# Patient Record
Sex: Female | Born: 1940
Health system: Southern US, Community
[De-identification: ages and names within clinical notes are randomized; demographics above are authoritative.]

## PROBLEM LIST (undated history)

## (undated) DIAGNOSIS — F32A Depression, unspecified: Secondary | ICD-10-CM

## (undated) DIAGNOSIS — F329 Major depressive disorder, single episode, unspecified: Secondary | ICD-10-CM

## (undated) DIAGNOSIS — J449 Chronic obstructive pulmonary disease, unspecified: Secondary | ICD-10-CM

## (undated) DIAGNOSIS — Z923 Personal history of irradiation: Secondary | ICD-10-CM

## (undated) DIAGNOSIS — J329 Chronic sinusitis, unspecified: Secondary | ICD-10-CM

## (undated) DIAGNOSIS — R42 Dizziness and giddiness: Secondary | ICD-10-CM

## (undated) DIAGNOSIS — D649 Anemia, unspecified: Secondary | ICD-10-CM

## (undated) DIAGNOSIS — G2581 Restless legs syndrome: Secondary | ICD-10-CM

## (undated) DIAGNOSIS — I10 Essential (primary) hypertension: Secondary | ICD-10-CM

## (undated) DIAGNOSIS — E039 Hypothyroidism, unspecified: Secondary | ICD-10-CM

## (undated) DIAGNOSIS — T7840XA Allergy, unspecified, initial encounter: Secondary | ICD-10-CM

## (undated) DIAGNOSIS — R0902 Hypoxemia: Secondary | ICD-10-CM

## (undated) DIAGNOSIS — Z9221 Personal history of antineoplastic chemotherapy: Secondary | ICD-10-CM

## (undated) DIAGNOSIS — H409 Unspecified glaucoma: Secondary | ICD-10-CM

## (undated) DIAGNOSIS — C50919 Malignant neoplasm of unspecified site of unspecified female breast: Secondary | ICD-10-CM

## (undated) DIAGNOSIS — H269 Unspecified cataract: Secondary | ICD-10-CM

## (undated) HISTORY — DX: Malignant neoplasm of unspecified site of unspecified female breast: C50.919

## (undated) HISTORY — DX: Anemia, unspecified: D64.9

## (undated) HISTORY — PX: ABDOMINAL HYSTERECTOMY: SHX81

## (undated) HISTORY — DX: Depression, unspecified: F32.A

## (undated) HISTORY — DX: Allergy, unspecified, initial encounter: T78.40XA

## (undated) HISTORY — DX: Essential (primary) hypertension: I10

## (undated) HISTORY — DX: Unspecified cataract: H26.9

## (undated) HISTORY — DX: Unspecified glaucoma: H40.9

## (undated) HISTORY — DX: Restless legs syndrome: G25.81

## (undated) HISTORY — DX: Major depressive disorder, single episode, unspecified: F32.9

## (undated) HISTORY — DX: Hypothyroidism, unspecified: E03.9

## (undated) HISTORY — DX: Hypoxemia: R09.02

---

## 1990-02-19 HISTORY — PX: VESICOVAGINAL FISTULA CLOSURE W/ TAH: SUR271

## 1997-12-14 ENCOUNTER — Other Ambulatory Visit: Admission: RE | Admit: 1997-12-14 | Discharge: 1997-12-14 | Payer: Self-pay | Admitting: Gynecology

## 1999-08-07 ENCOUNTER — Emergency Department (HOSPITAL_COMMUNITY): Admission: EM | Admit: 1999-08-07 | Discharge: 1999-08-07 | Payer: Self-pay | Admitting: *Deleted

## 1999-09-05 ENCOUNTER — Encounter: Admission: RE | Admit: 1999-09-05 | Discharge: 1999-09-05 | Payer: Self-pay | Admitting: Gynecology

## 1999-09-05 ENCOUNTER — Encounter: Payer: Self-pay | Admitting: Gynecology

## 1999-09-11 ENCOUNTER — Encounter: Admission: RE | Admit: 1999-09-11 | Discharge: 1999-09-11 | Payer: Self-pay | Admitting: Gynecology

## 1999-09-11 ENCOUNTER — Encounter: Payer: Self-pay | Admitting: Gynecology

## 1999-11-07 ENCOUNTER — Encounter: Payer: Self-pay | Admitting: *Deleted

## 1999-11-07 ENCOUNTER — Ambulatory Visit (HOSPITAL_COMMUNITY): Admission: RE | Admit: 1999-11-07 | Discharge: 1999-11-07 | Payer: Self-pay | Admitting: *Deleted

## 1999-11-14 ENCOUNTER — Ambulatory Visit (HOSPITAL_COMMUNITY): Admission: RE | Admit: 1999-11-14 | Discharge: 1999-11-14 | Payer: Self-pay | Admitting: *Deleted

## 1999-11-14 ENCOUNTER — Encounter: Payer: Self-pay | Admitting: *Deleted

## 1999-11-27 ENCOUNTER — Ambulatory Visit (HOSPITAL_COMMUNITY): Admission: RE | Admit: 1999-11-27 | Discharge: 1999-11-27 | Payer: Self-pay | Admitting: *Deleted

## 1999-11-27 ENCOUNTER — Encounter: Payer: Self-pay | Admitting: *Deleted

## 2000-09-10 ENCOUNTER — Other Ambulatory Visit: Admission: RE | Admit: 2000-09-10 | Discharge: 2000-09-10 | Payer: Self-pay | Admitting: Gynecology

## 2000-09-10 ENCOUNTER — Encounter: Payer: Self-pay | Admitting: Gynecology

## 2000-09-10 ENCOUNTER — Encounter: Admission: RE | Admit: 2000-09-10 | Discharge: 2000-09-10 | Payer: Self-pay | Admitting: Gynecology

## 2000-11-19 ENCOUNTER — Encounter: Payer: Self-pay | Admitting: Gynecology

## 2000-11-19 ENCOUNTER — Encounter: Admission: RE | Admit: 2000-11-19 | Discharge: 2000-11-19 | Payer: Self-pay | Admitting: Gynecology

## 2001-09-16 ENCOUNTER — Encounter: Admission: RE | Admit: 2001-09-16 | Discharge: 2001-09-16 | Payer: Self-pay | Admitting: Gynecology

## 2001-09-16 ENCOUNTER — Encounter: Payer: Self-pay | Admitting: Gynecology

## 2001-09-16 ENCOUNTER — Other Ambulatory Visit: Admission: RE | Admit: 2001-09-16 | Discharge: 2001-09-16 | Payer: Self-pay | Admitting: Gynecology

## 2001-11-18 ENCOUNTER — Encounter: Payer: Self-pay | Admitting: Gynecology

## 2001-11-18 ENCOUNTER — Encounter: Admission: RE | Admit: 2001-11-18 | Discharge: 2001-11-18 | Payer: Self-pay | Admitting: Gynecology

## 2002-06-07 ENCOUNTER — Encounter: Payer: Self-pay | Admitting: *Deleted

## 2002-06-07 ENCOUNTER — Ambulatory Visit (HOSPITAL_COMMUNITY): Admission: RE | Admit: 2002-06-07 | Discharge: 2002-06-07 | Payer: Self-pay | Admitting: *Deleted

## 2002-07-31 ENCOUNTER — Ambulatory Visit (HOSPITAL_COMMUNITY): Admission: RE | Admit: 2002-07-31 | Discharge: 2002-07-31 | Payer: Self-pay | Admitting: Surgery

## 2002-07-31 ENCOUNTER — Encounter: Payer: Self-pay | Admitting: Surgery

## 2002-09-22 ENCOUNTER — Encounter: Admission: RE | Admit: 2002-09-22 | Discharge: 2002-09-22 | Payer: Self-pay | Admitting: Gynecology

## 2002-09-22 ENCOUNTER — Other Ambulatory Visit: Admission: RE | Admit: 2002-09-22 | Discharge: 2002-09-22 | Payer: Self-pay | Admitting: Gynecology

## 2002-09-22 ENCOUNTER — Encounter: Payer: Self-pay | Admitting: Gynecology

## 2002-09-27 ENCOUNTER — Encounter: Payer: Self-pay | Admitting: *Deleted

## 2002-09-27 ENCOUNTER — Ambulatory Visit (HOSPITAL_COMMUNITY): Admission: RE | Admit: 2002-09-27 | Discharge: 2002-09-27 | Payer: Self-pay | Admitting: *Deleted

## 2002-11-10 ENCOUNTER — Ambulatory Visit: Admission: RE | Admit: 2002-11-10 | Discharge: 2002-11-10 | Payer: Self-pay | Admitting: Pulmonary Disease

## 2003-01-12 ENCOUNTER — Encounter: Admission: RE | Admit: 2003-01-12 | Discharge: 2003-01-12 | Payer: Self-pay | Admitting: Pulmonary Disease

## 2003-01-12 IMAGING — CT CT CHEST W/O CM
1 of 3 series · 12 of 31 positions shown, 15 images · non-contrast
Comparison: none

[Series 2: routine chest · axial · 0.70mm/px · z∈[-295,-20]mm · 12 of 67 slices shown, 15 images]
[im 6/67  mediastinal]
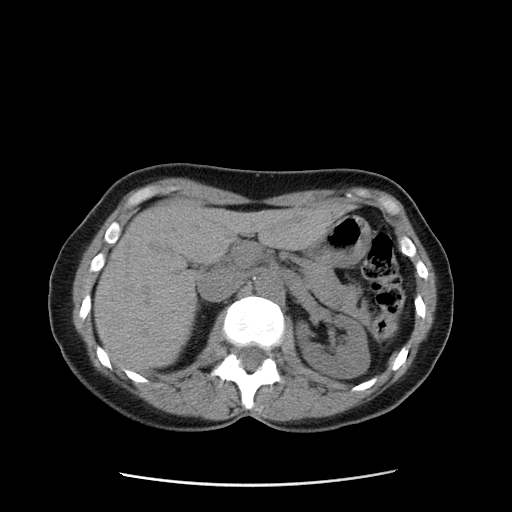
[im 6/67  lung]
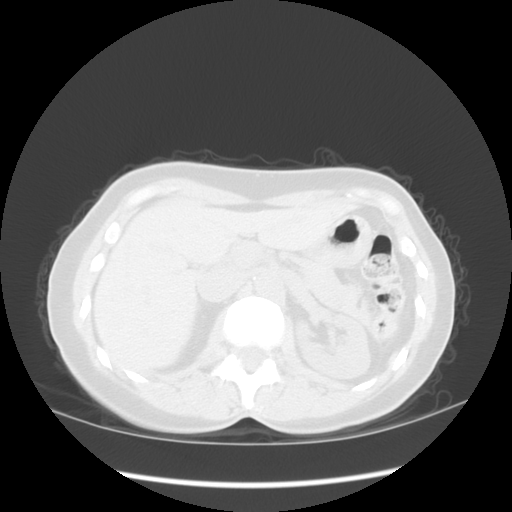
[im 12/67  lung]
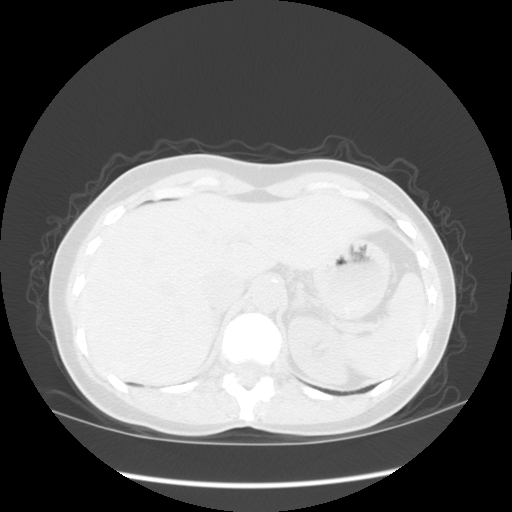
[im 17/67  lung]
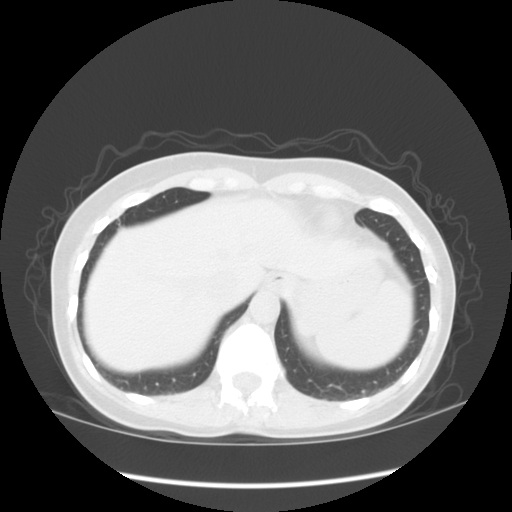
[im 23/67  lung]
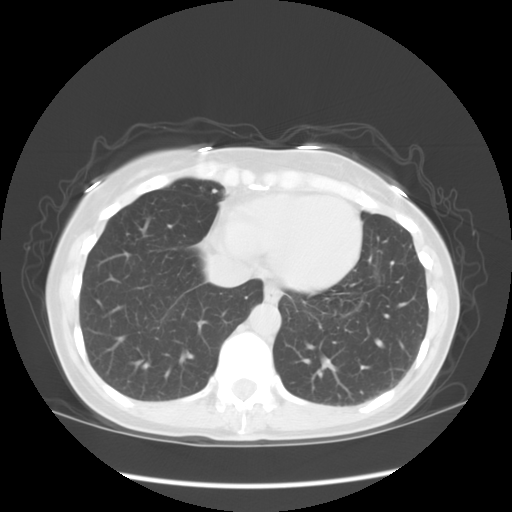
[im 28/67  mediastinal]
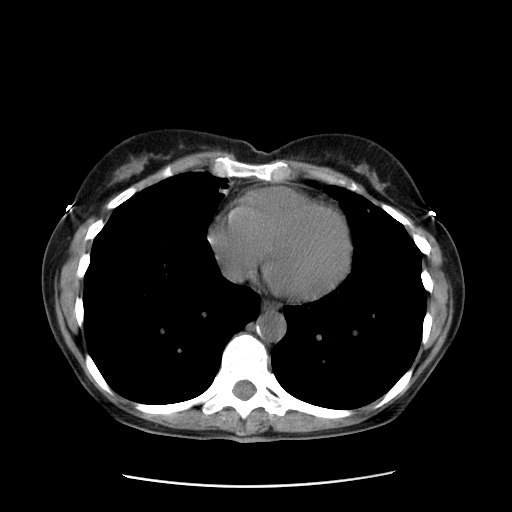
[im 28/67  lung]
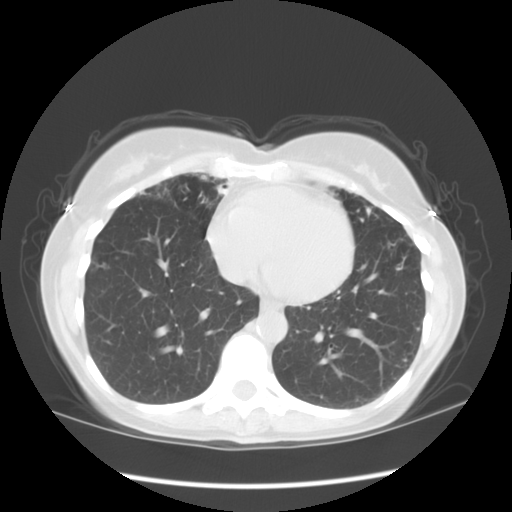
[im 32/67  lung]
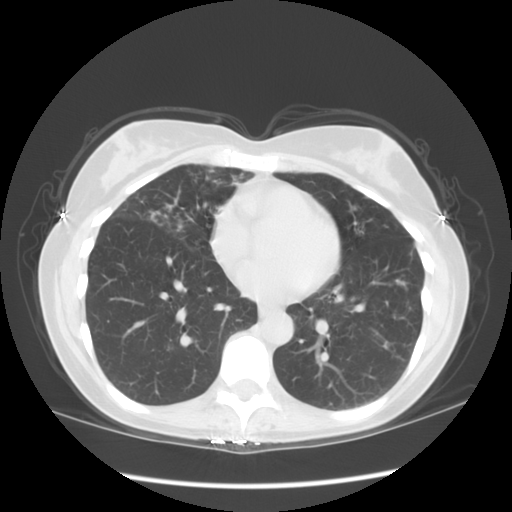
[im 34/67  lung]
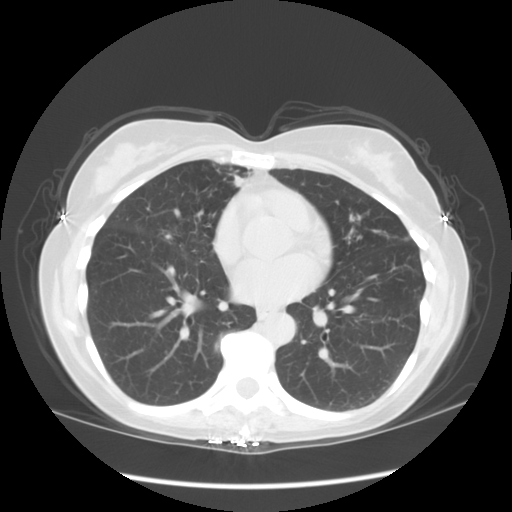
[im 39/67  lung]
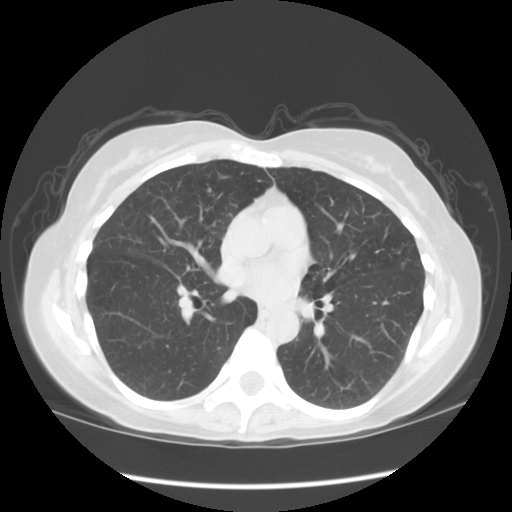
[im 45/67  mediastinal]
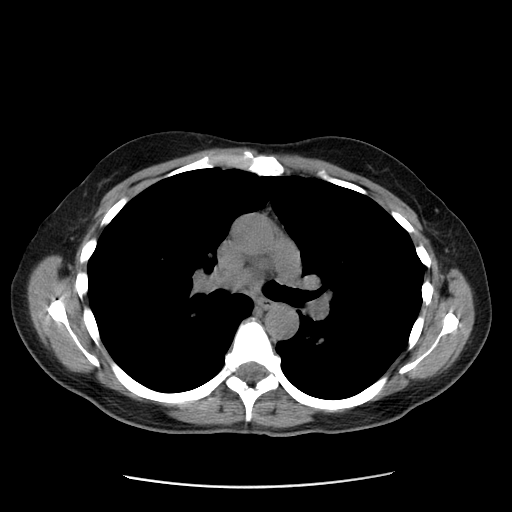
[im 45/67  lung]
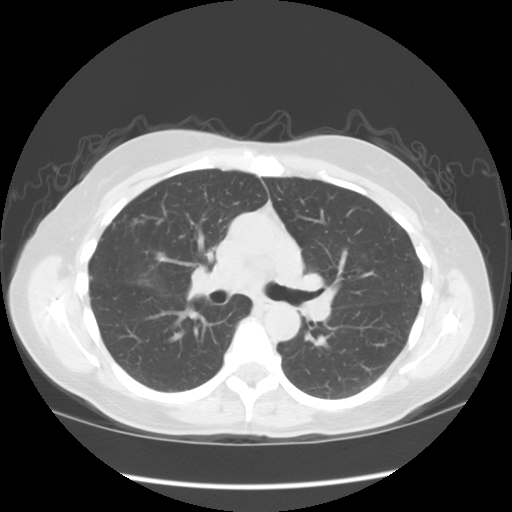
[im 50/67  lung]
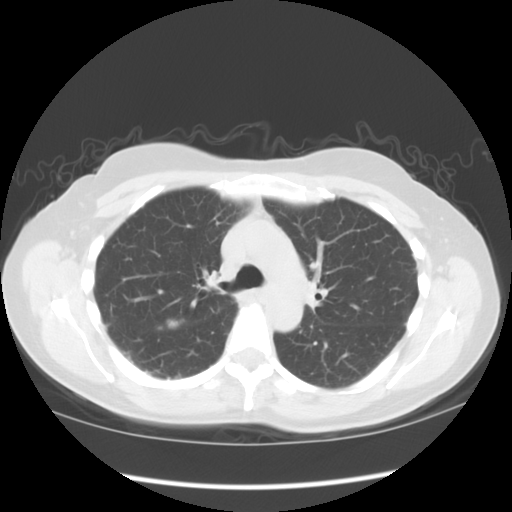
[im 56/67  lung]
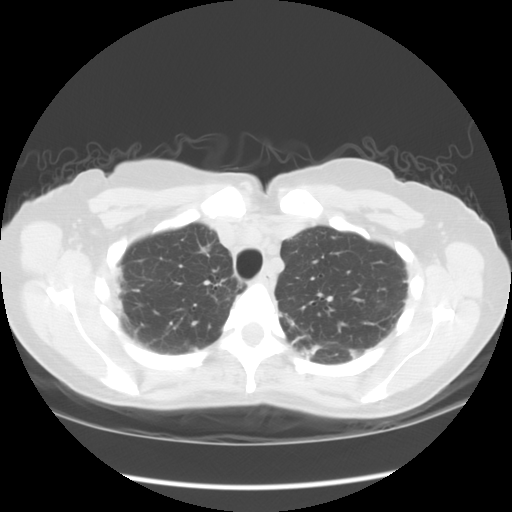
[im 61/67  lung]
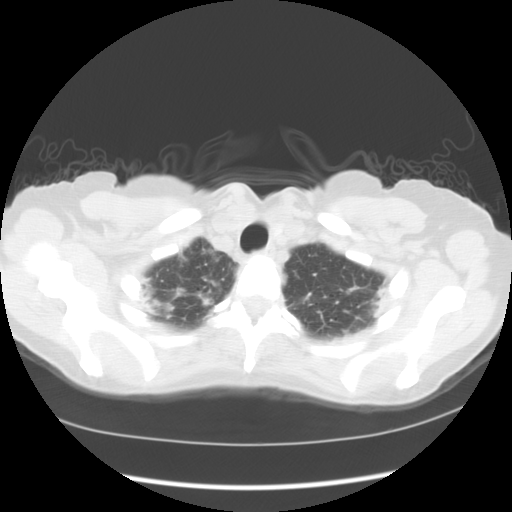

[12 of 31 positions shown; findings below may reference images not displayed]

<!--  IDXRADR:ADDEND:BEGIN -->Addendum Begins<!--  IDXRADR:ADDEND:INNER_BEGIN -->ADDENDUM:
 Previous [HOSPITAL] chest CT of [DATE] is currently available for comparison.  Since the prior study, there is resolution of patchy atelectatic infiltrative change at the right middle lobe.  Previous 8 mm spiculated mass which is currently 10 x 7 mm (image 22) is indeterminate for primary bronchogenic carcinoma or area of focal scarring.  COPD and areas of likely chronic linear fibrosis are seen primarily at the lung apices.  Allowing for no IV contrast, no progressive mediastinal, hilar, nor axillary mass/adenopathy is seen since the prior study.  Remaining findings previously described are stable. 
 IMPRESSION
 Since [HOSPITAL] chest CT, [DATE]:
 1.  Clearing of previous patchy atelectatic infiltrative change at the right middle lobe.
 2.  Stable to minimally enlarged spiculated currently 10 x 7 mm mass right upper lobe ? primary bronchogenic carcinoma versus scarring.  This is at the margin of nuclear medicine PET scanning resolution.  Nuclear medicine PET imaging and/or follow-up chest CT in six months are advised for further evaluation of this finding.
 3.    COPD with chronic lung changes, stable. 

 <!--  IDXRADR:ADDEND:INNER_END -->Addendum Ends
<!--  IDXRADR:ADDEND:END -->Clinical Data:  Follow-up nodules. 
 CT CHEST WITHOUT CONTRAST
 Comparison to prior studies are not available at this time.  The patient did have a CT of the chest at [HOSPITAL], [DATE].  A preliminary dictation will be performed and then an addendum can be issued once the prior studies have been received.  
 5 mm spiral images were obtained through the chest without contrast.  Subsequently, high-resolution images through the chest were obtained at 15 mm intervals and 1 mm sections.  On image 21, there is a 1.0 x 1.2 cm pretracheal lymph node which is borderline enlarged.  There is no other evidence of abnormal mediastinal mass effect or axillary mass effect.
 No pneumothoraces or effusions are seen.  Patchy parenchymal densities are seen at both apices.  In the right upper lobe on image 22, there is a 1.0 x 0.7 cm spiculated density.  Smaller nodules are seen in the right upper lobe adjacent to this original lesion. On image 24 in the right upper lobe, there is a 4 mm nodule and in the right upper lobe on image 23 laterally, there is a 2 mm nodule.  In the right middle lobe on image 29, there is a 3 mm nodule anteriorly.  Irregular parenchymal densities are seen in the medial right middle lobe on image #34.  Linear opacities are seen posterior to this density in the right middle lobe.  In the lingula on image 41, there is a 6 mm nodule. In the superior segment of the left lower lobe on image 11, minimal irregular parenchymal densities are noted.  
 On high-resolution images, patchy parenchymal densities are seen at the lung apices as well as the superior segment of both lower lobes with a peripheral distribution.  There is no evidence of honey-combing, septal thickening, or cystic lung disease.  Somewhat nodular thickening of the lobar and segmental bronchial airways are identified.  There is mild nodular thickening of the right greater fissure on image 10 of the high-resolution series, left greater fissure on image 4 and right greater fissure on image 4.  This nodular disease is subtle.  There is no evidence of fibrosis.  Images of the abdomen were performed.  Gallstones are visualized.  
 IMPRESSION
 1.  Cholelithiasis is suspected and can be confirmed with sonography.
 2.  Bilateral parenchymal nodules are seen as described.  This was previously described and once the prior studies have been received, a direct comparison can be performed.  
 3.  Irregular parenchymal changes are seen at the lung apices and superior lower lobes with a peripheral distribution.  If this is indeed a chronic process, considerations would include: atypical infection, eosinophilic pneumonia, bronchiolitis obliterans organizing pneumonia.  Occasionally sarcoidosis can result in upper lobe parenchymal changes. 
 4.  Nodular thickening of the airways with some pleural nodularity is noted.  This is a subtle finding and can also be seen with sarcoidosis.  Correlate clinically.
 5.  Borderline enlarged lymph node in the mediastinum. 

 co

## 2005-03-16 ENCOUNTER — Emergency Department (HOSPITAL_COMMUNITY): Admission: EM | Admit: 2005-03-16 | Discharge: 2005-03-17 | Payer: Self-pay | Admitting: Emergency Medicine

## 2005-03-16 DIAGNOSIS — I447 Left bundle-branch block, unspecified: Secondary | ICD-10-CM

## 2005-03-16 HISTORY — DX: Left bundle-branch block, unspecified: I44.7

## 2005-03-17 IMAGING — CT CT HEAD W/O CM
1 series · 16 of 30 positions shown, 20 images · non-contrast
Comparison: None.

CLINICAL DATA: Headache, dizziness, sinusitis.
 HEAD CT WITHOUT CONTRAST ? [DATE]:
TECHNIQUE: Contiguous axial CT images were obtained from the base of the skull through the vertex according to standard protocol without contrast.

[Series 2: headseq 4.8 h45s · axial · 0.42mm/px · z∈[+1080,+1220]mm · 16 of 33 slices shown, 20 images]
[im 2/33  brain]
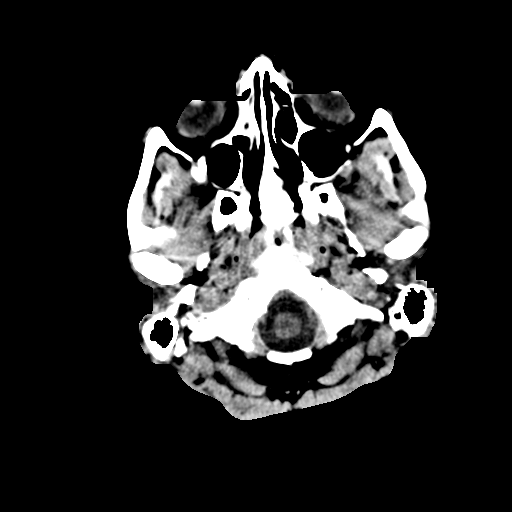
[im 2/33  bone]
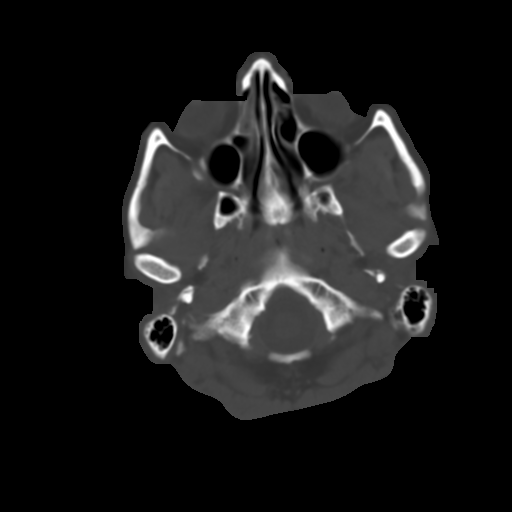
[im 4/33  brain]
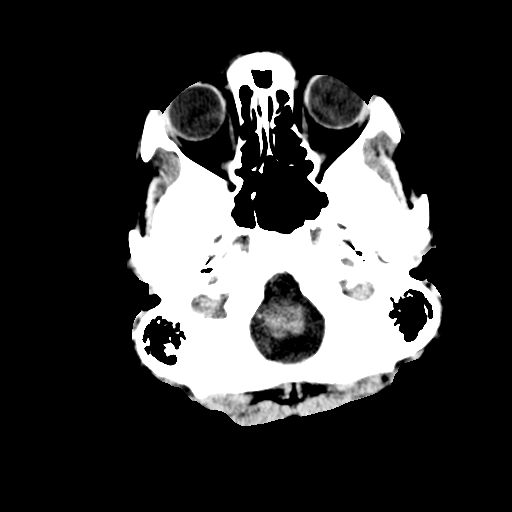
[im 6/33  brain]
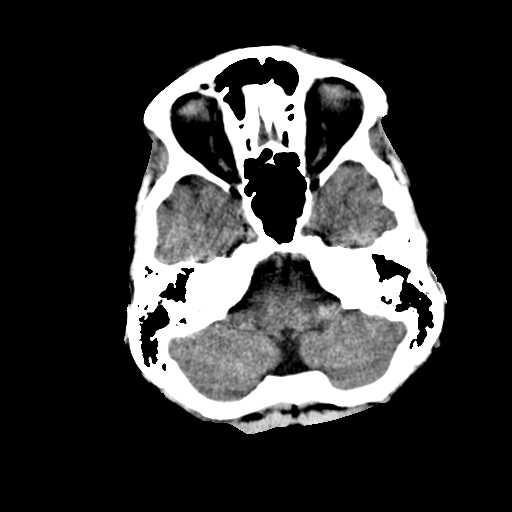
[im 8/33  brain]
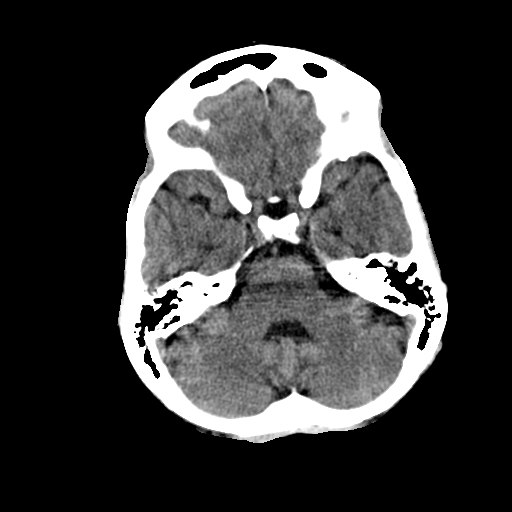
[im 9/33  brain]
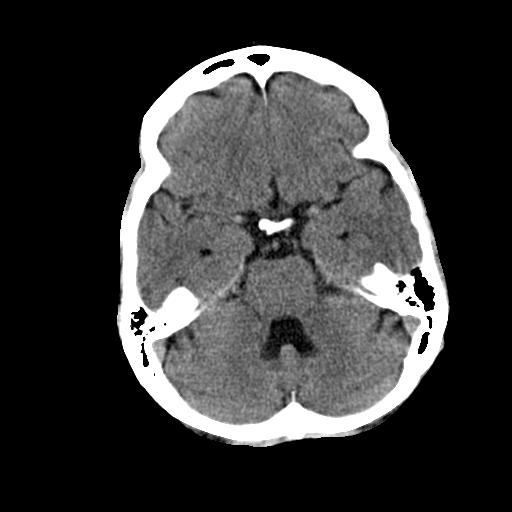
[im 9/33  bone]
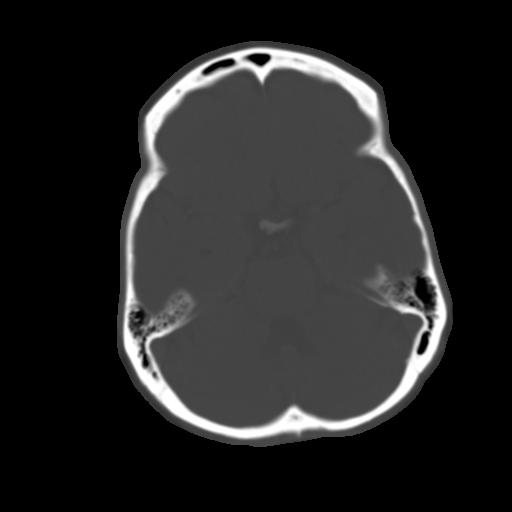
[im 12/33  brain]
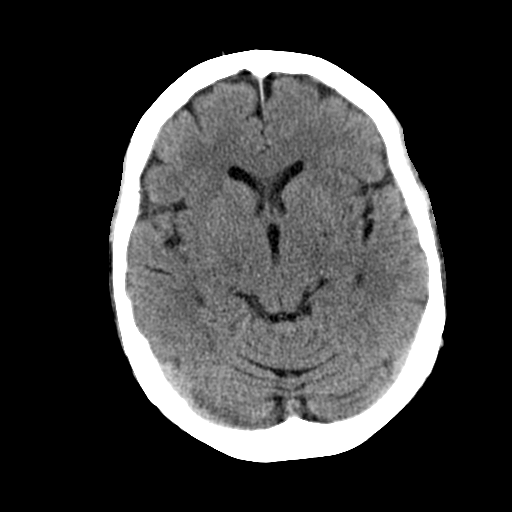
[im 14/33  brain]
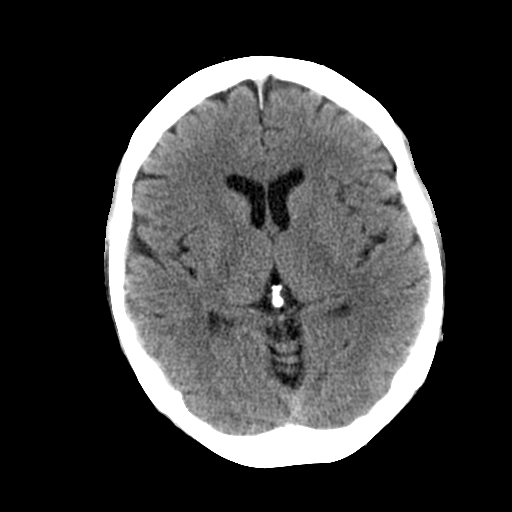
[im 16/33  brain]
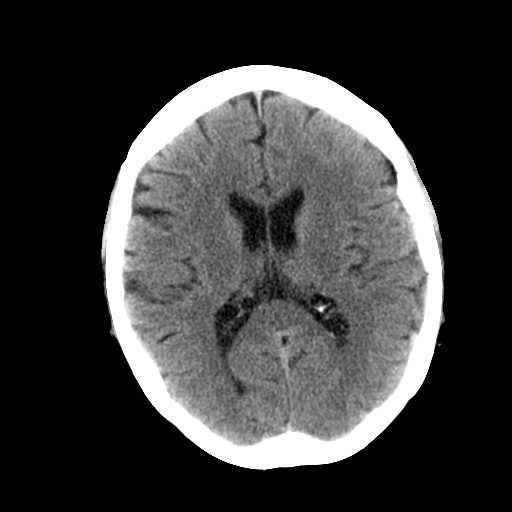
[im 17/33  brain]
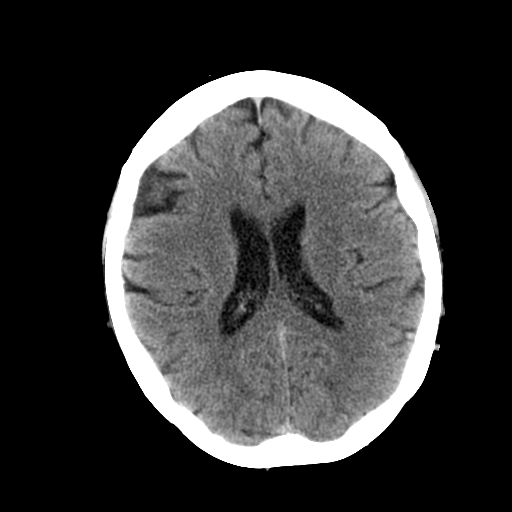
[im 17/33  bone]
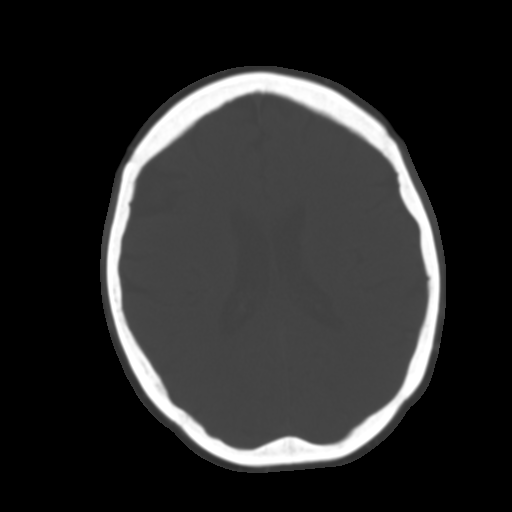
[im 19/33  brain]
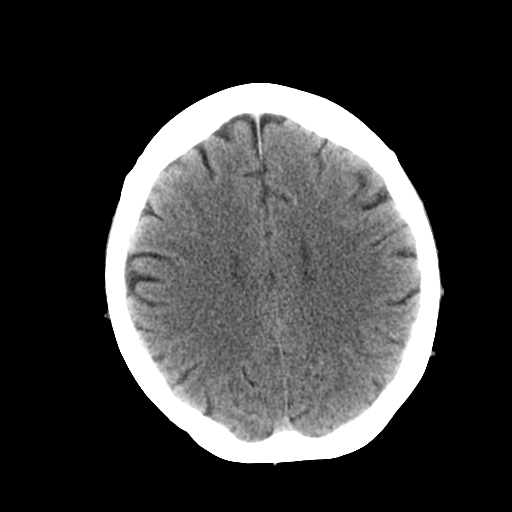
[im 21/33  brain]
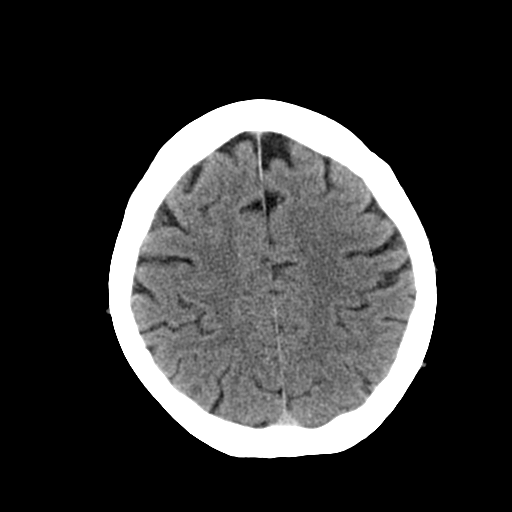
[im 24/33  brain]
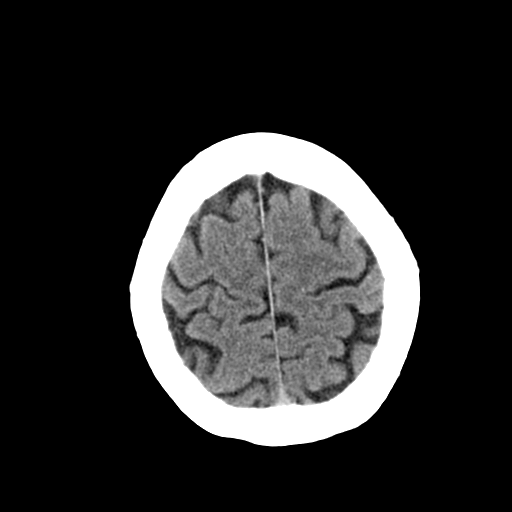
[im 25/33  brain]
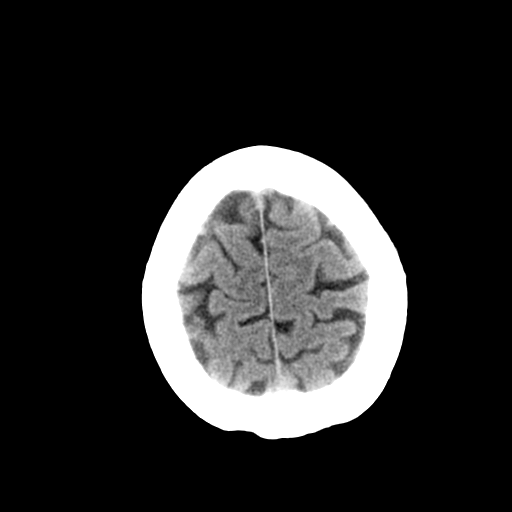
[im 25/33  bone]
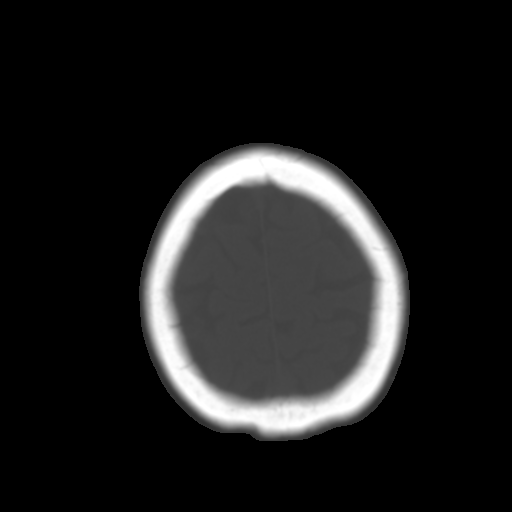
[im 27/33  brain]
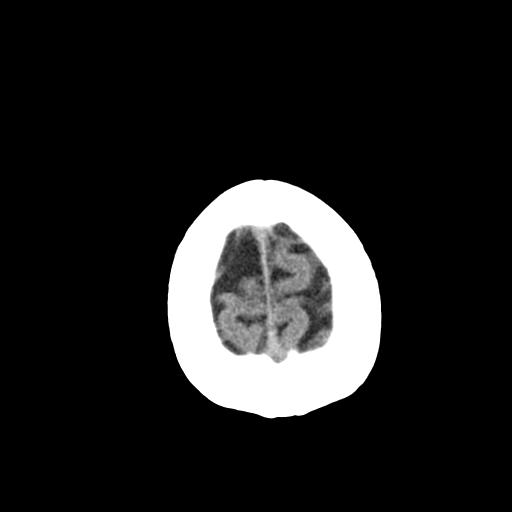
[im 29/33  brain]
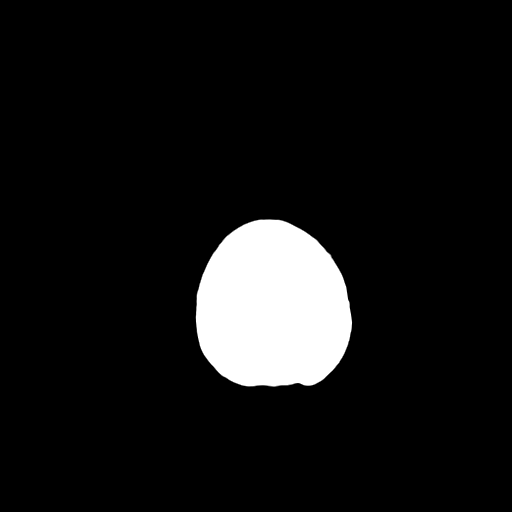
[im 31/33  brain]
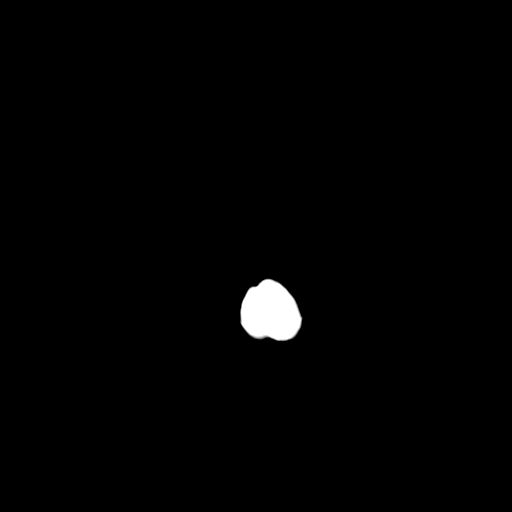

[16 of 30 positions shown; findings below may reference images not displayed]

FINDINGS: There is no evidence of intracranial hemorrhage, brain edema, or mass effect.  No other intra-axial abnormalities are seen, and the ventricles are within normal limits.  No abnormal extra-axial fluid collections or masses are identified.  No skull abnormalities are noted.
IMPRESSION: Negative non-contrast head CT.

## 2005-03-17 IMAGING — CR DG CHEST 2V
2 series · 2 of 2 positions shown · non-contrast
Comparison: Chest CT [DATE].

CLINICAL DATA: Cough, dizziness, nausea and vomiting.  
 CHEST - 2 VIEW: 
 PA and lateral chest radiograph - [DATE].

[w chest pa]
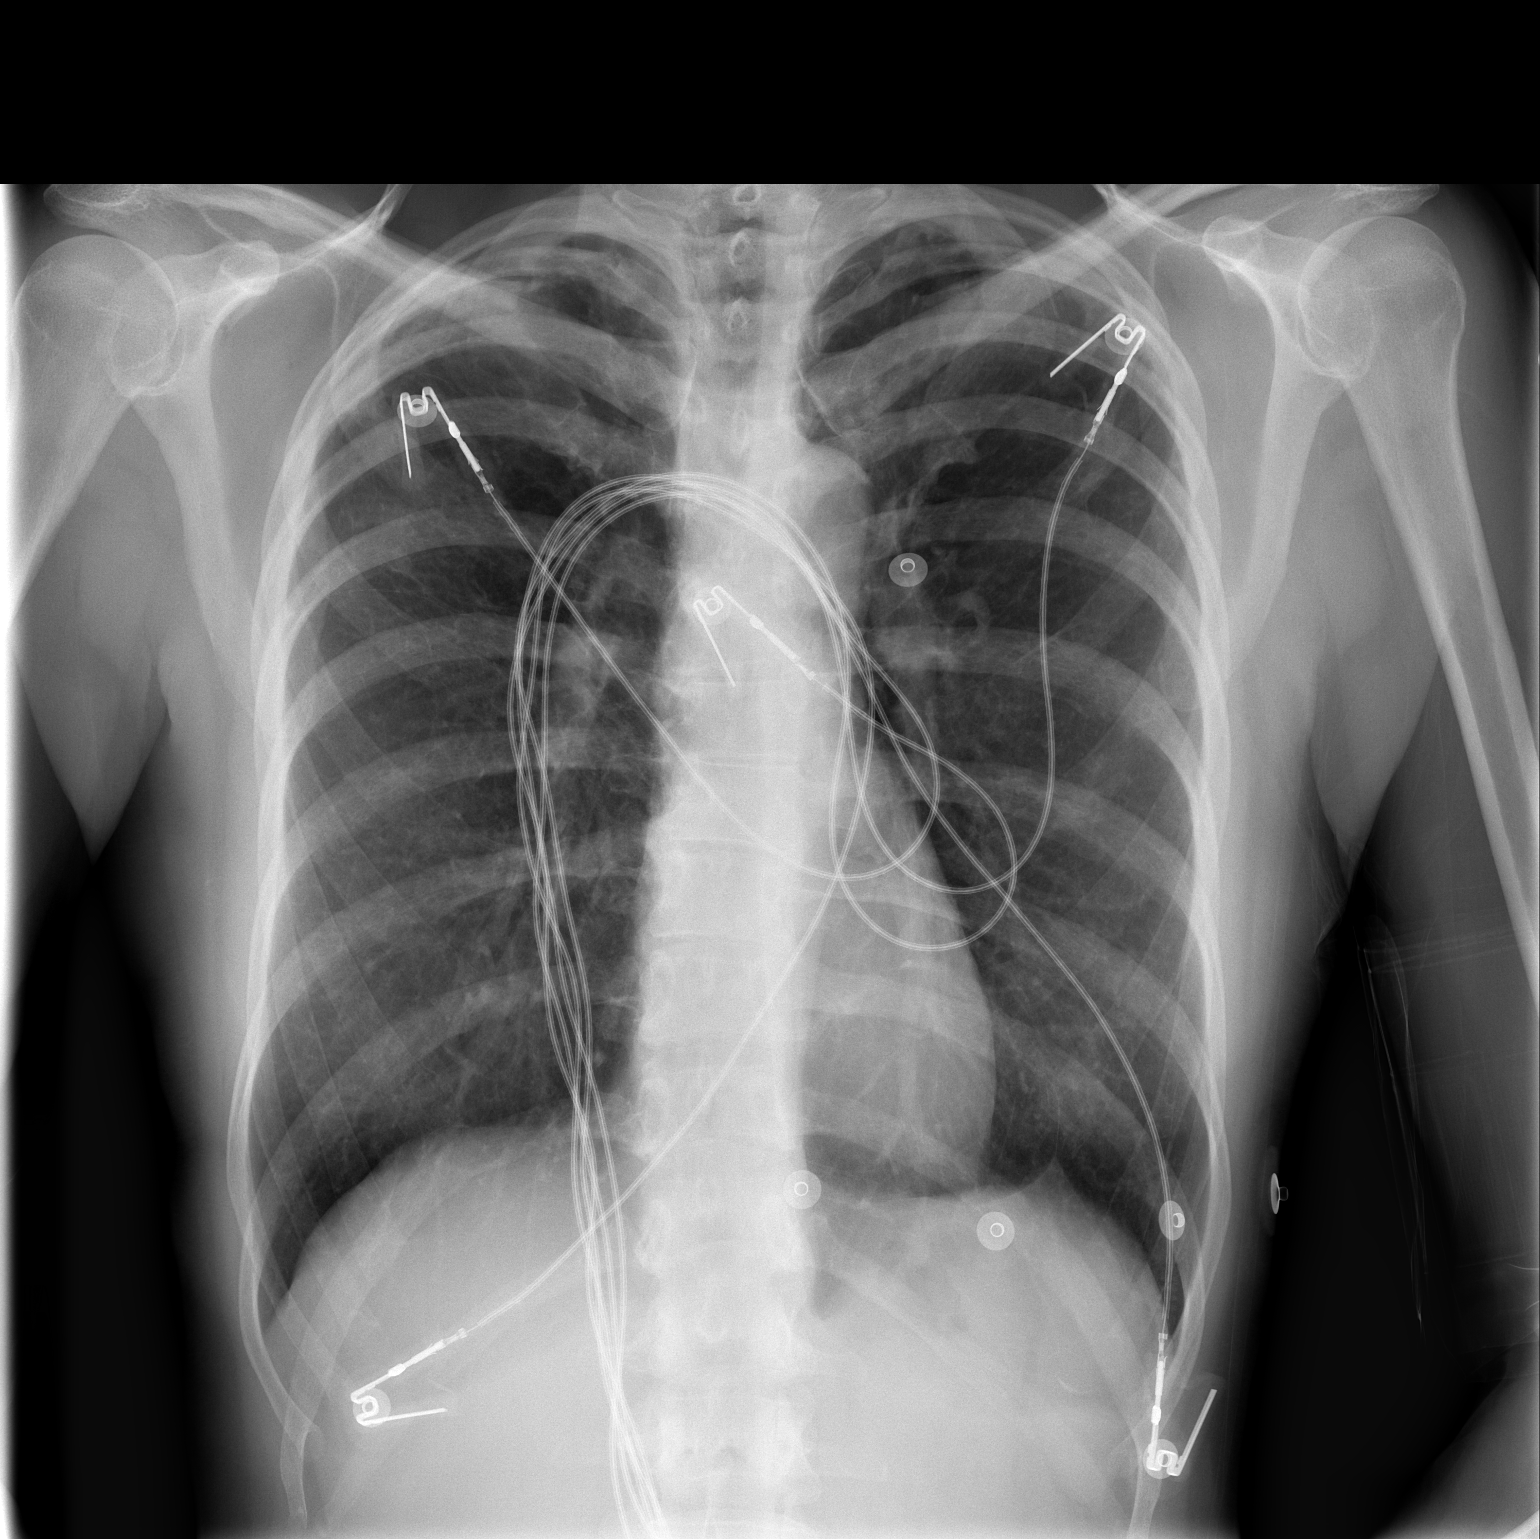

[w chest lat]
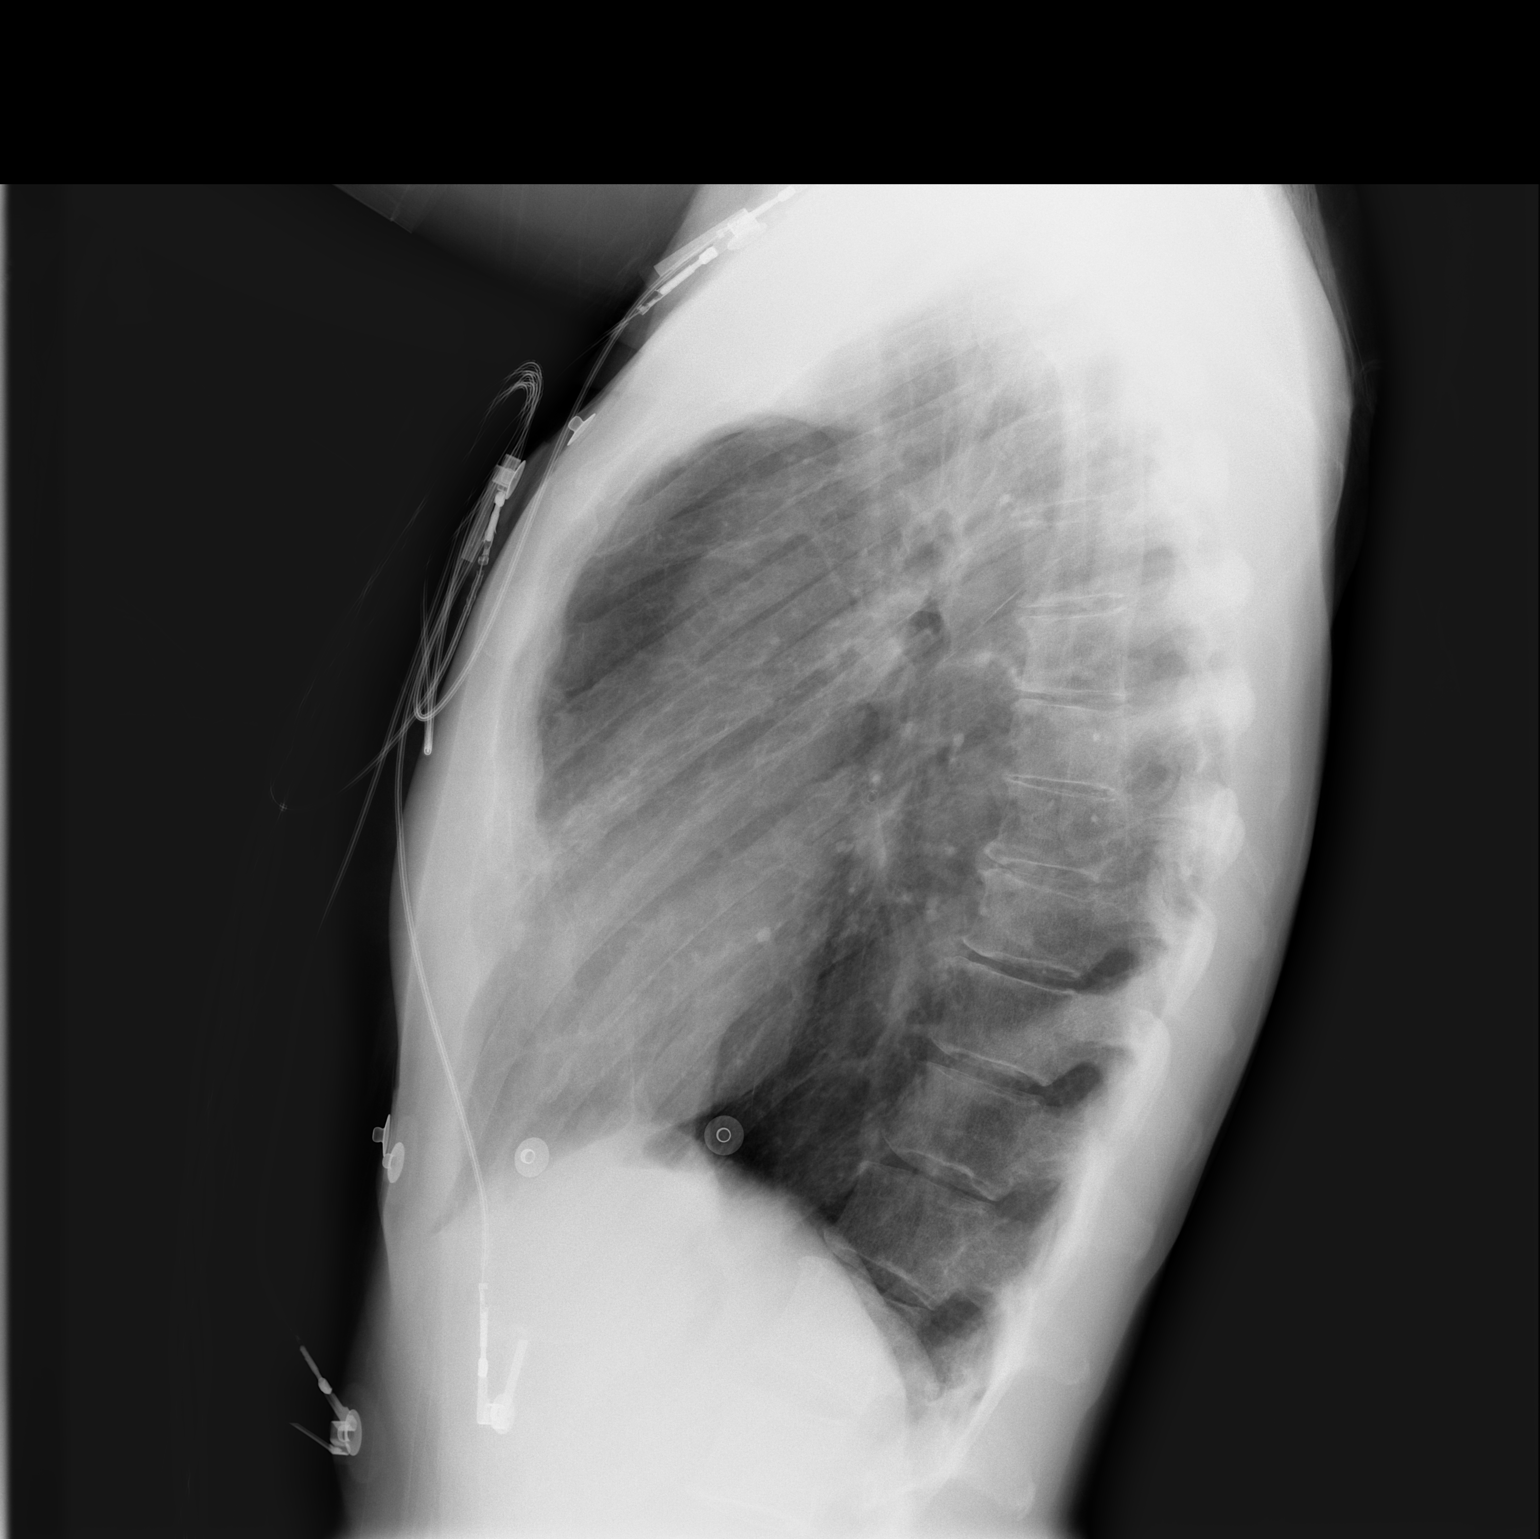

[2 of 2 positions shown; findings below may reference images not displayed]

FINDINGS: Cardiac leads overlie the chest.  The cardiomediastinal silhouette is within normal limits.  There is hyperinflation of both lungs suggesting COPD.  Biapical pleural thickening is again noted, better appreciated on the prior CT.  No pleural effusion or focal air space consolidation is seen.  No pneumothorax.  Osseous structures are intact.
IMPRESSION: No acute cardiopulmonary process.   
 Changes consistent with COPD.

## 2005-03-25 ENCOUNTER — Other Ambulatory Visit: Admission: RE | Admit: 2005-03-25 | Discharge: 2005-03-25 | Payer: Self-pay | Admitting: Gynecology

## 2005-03-26 ENCOUNTER — Encounter: Admission: RE | Admit: 2005-03-26 | Discharge: 2005-03-26 | Payer: Self-pay | Admitting: Gynecology

## 2005-12-14 ENCOUNTER — Ambulatory Visit: Payer: Self-pay | Admitting: Internal Medicine

## 2006-01-21 ENCOUNTER — Encounter (INDEPENDENT_AMBULATORY_CARE_PROVIDER_SITE_OTHER): Payer: Self-pay | Admitting: Specialist

## 2006-01-21 ENCOUNTER — Ambulatory Visit: Payer: Self-pay | Admitting: Internal Medicine

## 2006-03-09 ENCOUNTER — Emergency Department (HOSPITAL_COMMUNITY): Admission: EM | Admit: 2006-03-09 | Discharge: 2006-03-09 | Payer: Self-pay | Admitting: Emergency Medicine

## 2006-03-09 IMAGING — CT CT HEAD W/O CM
1 of 2 series · 13 of 30 positions shown, 17 images · IV contrast (agent unspecified)
Comparison: none

CLINICAL DATA: Dizziness with nausea and vomiting.
 HEAD CT WITHOUT CONTRAST:
TECHNIQUE: Contiguous axial images were obtained from the base of the skull through the vertex according to standard protocol without contrast.

[Series 2: brain · axial · 0.47mm/px · z∈[+138,+258]mm · 13 of 28 slices shown, 17 images]
[im 2/28  brain]
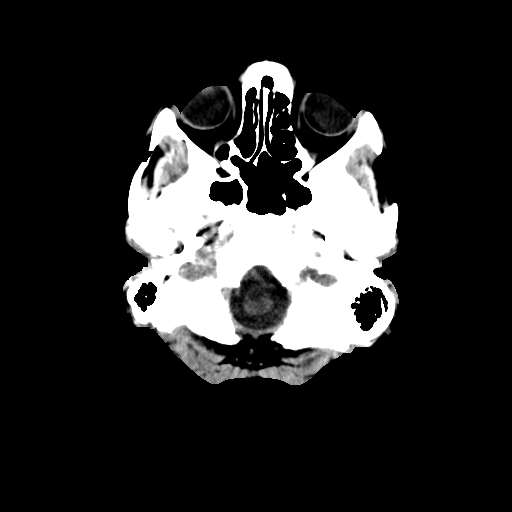
[im 2/28  bone]
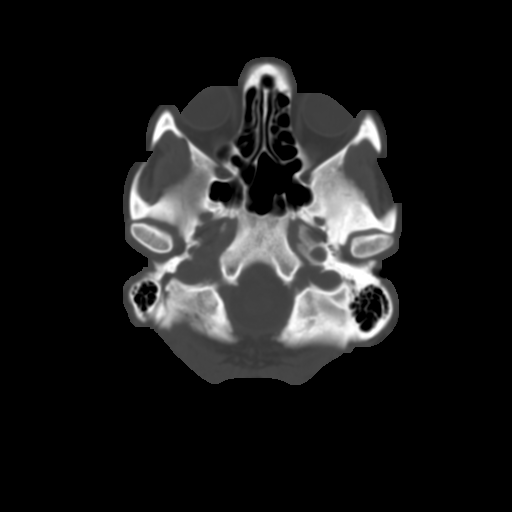
[im 4/28  brain]
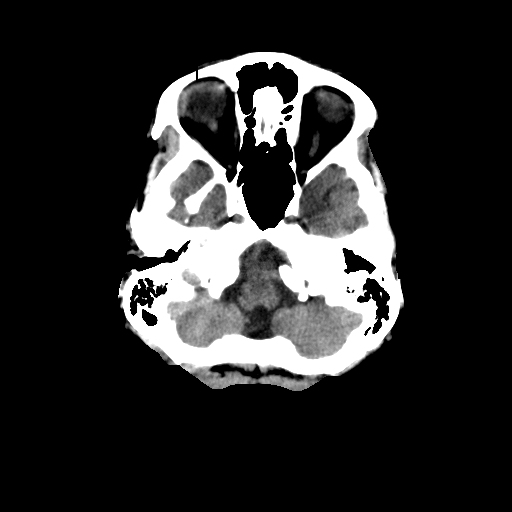
[im 6/28  brain]
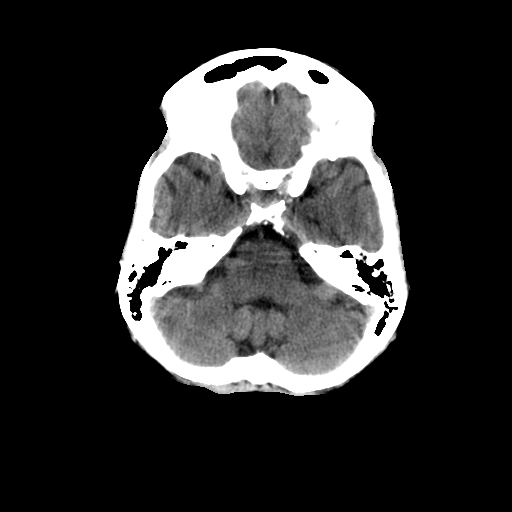
[im 8/28  brain]
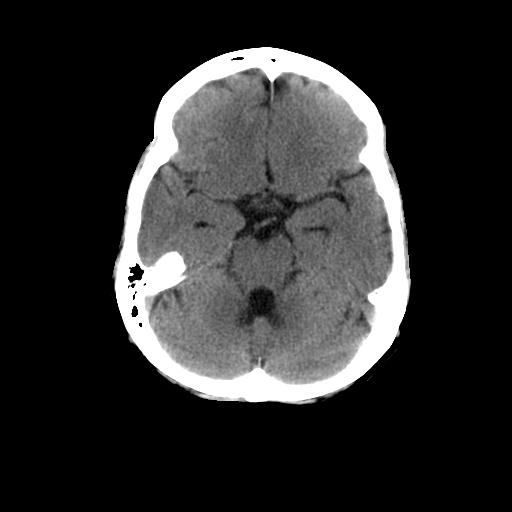
[im 10/28  brain]
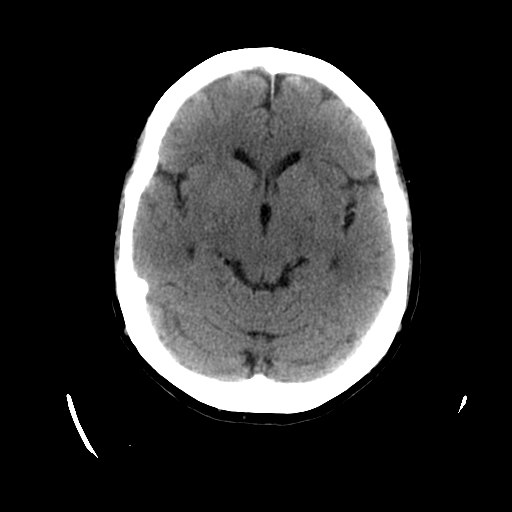
[im 10/28  bone]
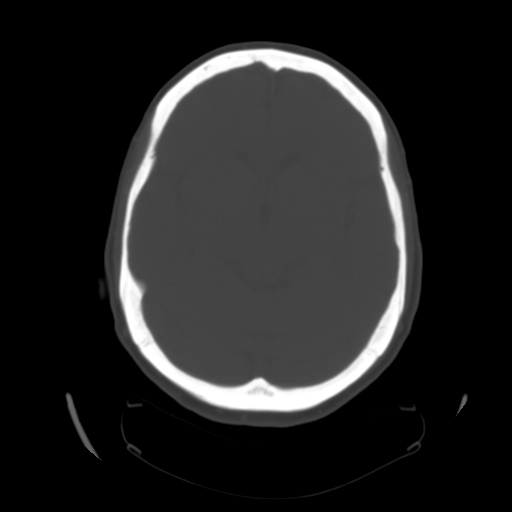
[im 12/28  brain]
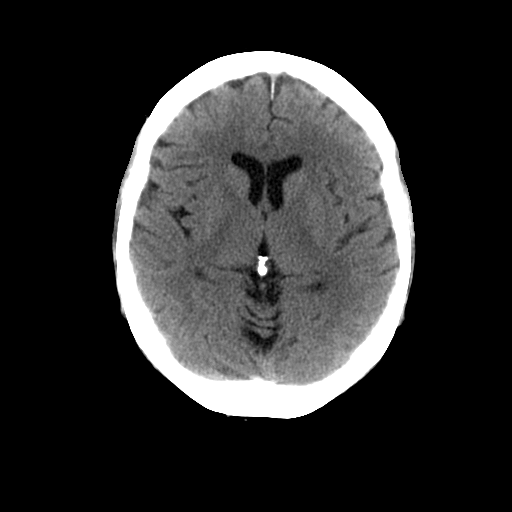
[im 14/28  brain]
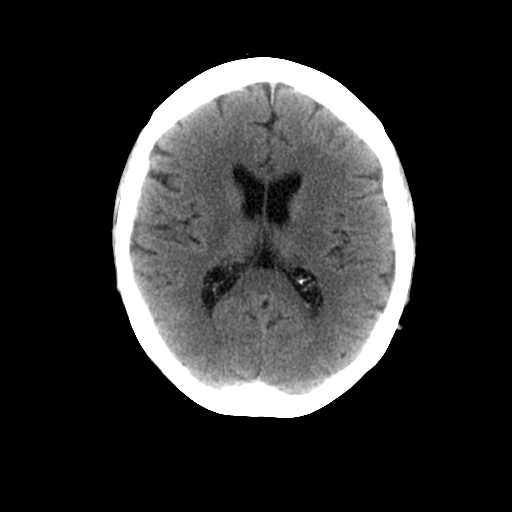
[im 16/28  brain]
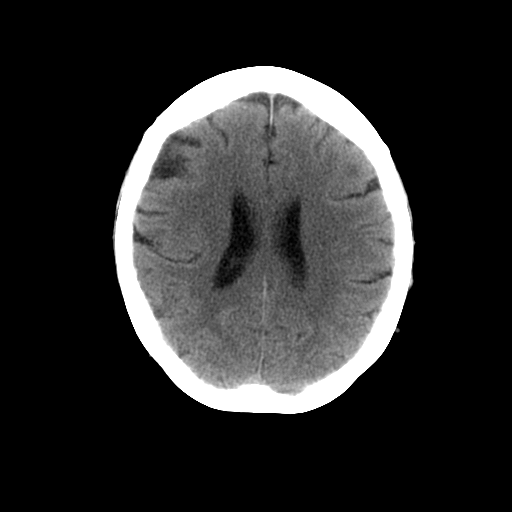
[im 18/28  brain]
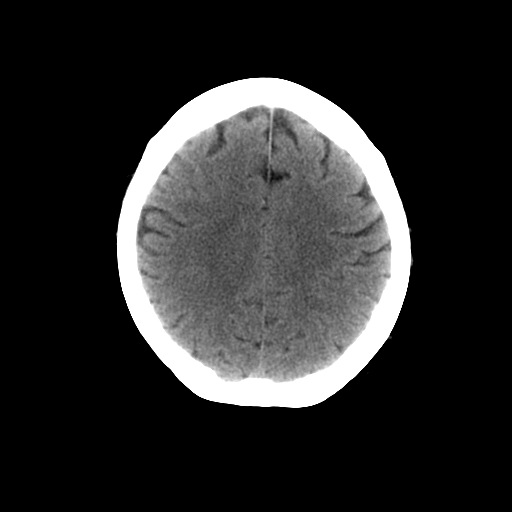
[im 18/28  bone]
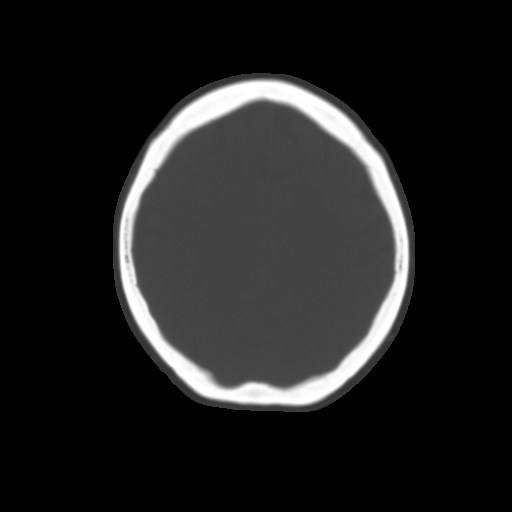
[im 20/28  brain]
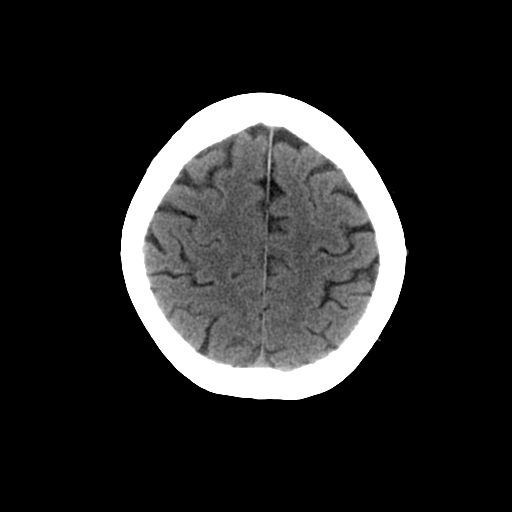
[im 22/28  brain]
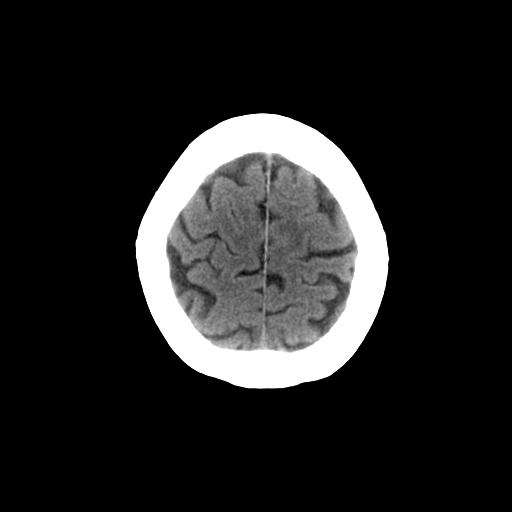
[im 24/28  brain]
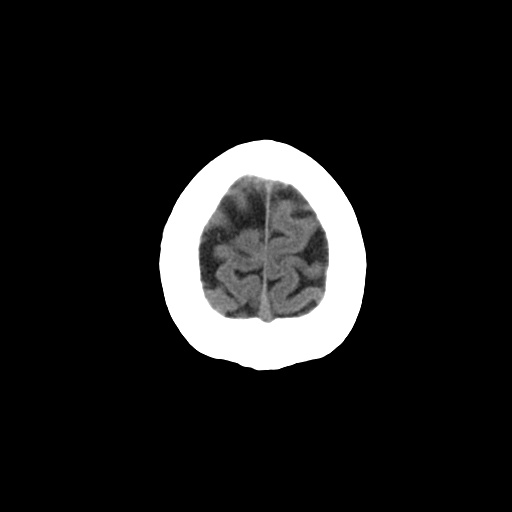
[im 26/28  brain]
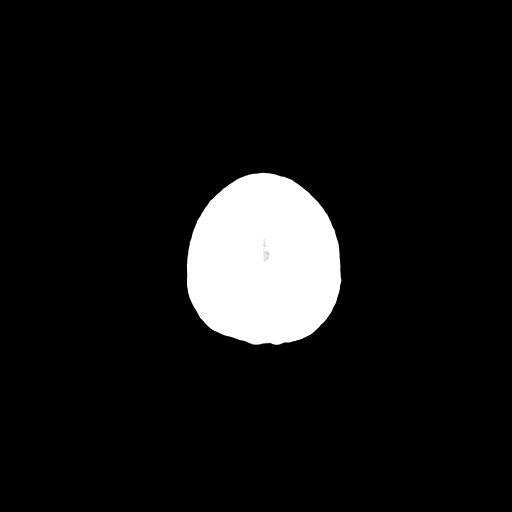
[im 26/28  bone]
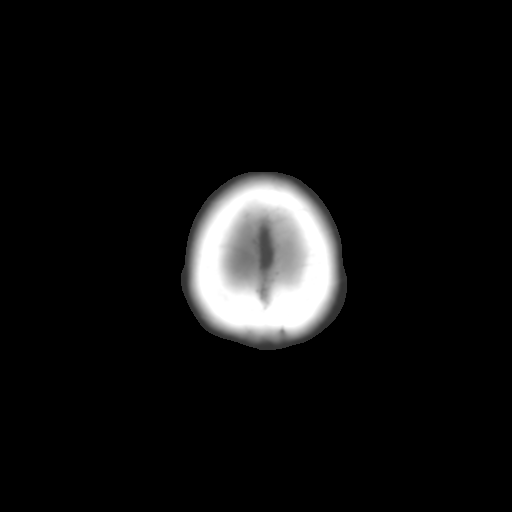

[13 of 30 positions shown; findings below may reference images not displayed]

FINDINGS: There is no evidence of acute intracranial abnormality, including mass or mass effect, hydrocephalus, extraaxial fluid collection, midline shift, hemorrhage or infarct.  Please note that acute infarct may be occult on CT.  Mild generalized cerebral volume loss is noted.
IMPRESSION: No evidence of acute intracranial abnormality.

## 2006-03-09 IMAGING — CR DG CHEST 1V PORT
1 series · 1 of 1 positions shown · non-contrast
Comparison: [DATE].

CLINICAL DATA: Dizziness.  Vomiting.
 PORTABLE CHEST ? 1 VIEW:

[view not recorded]
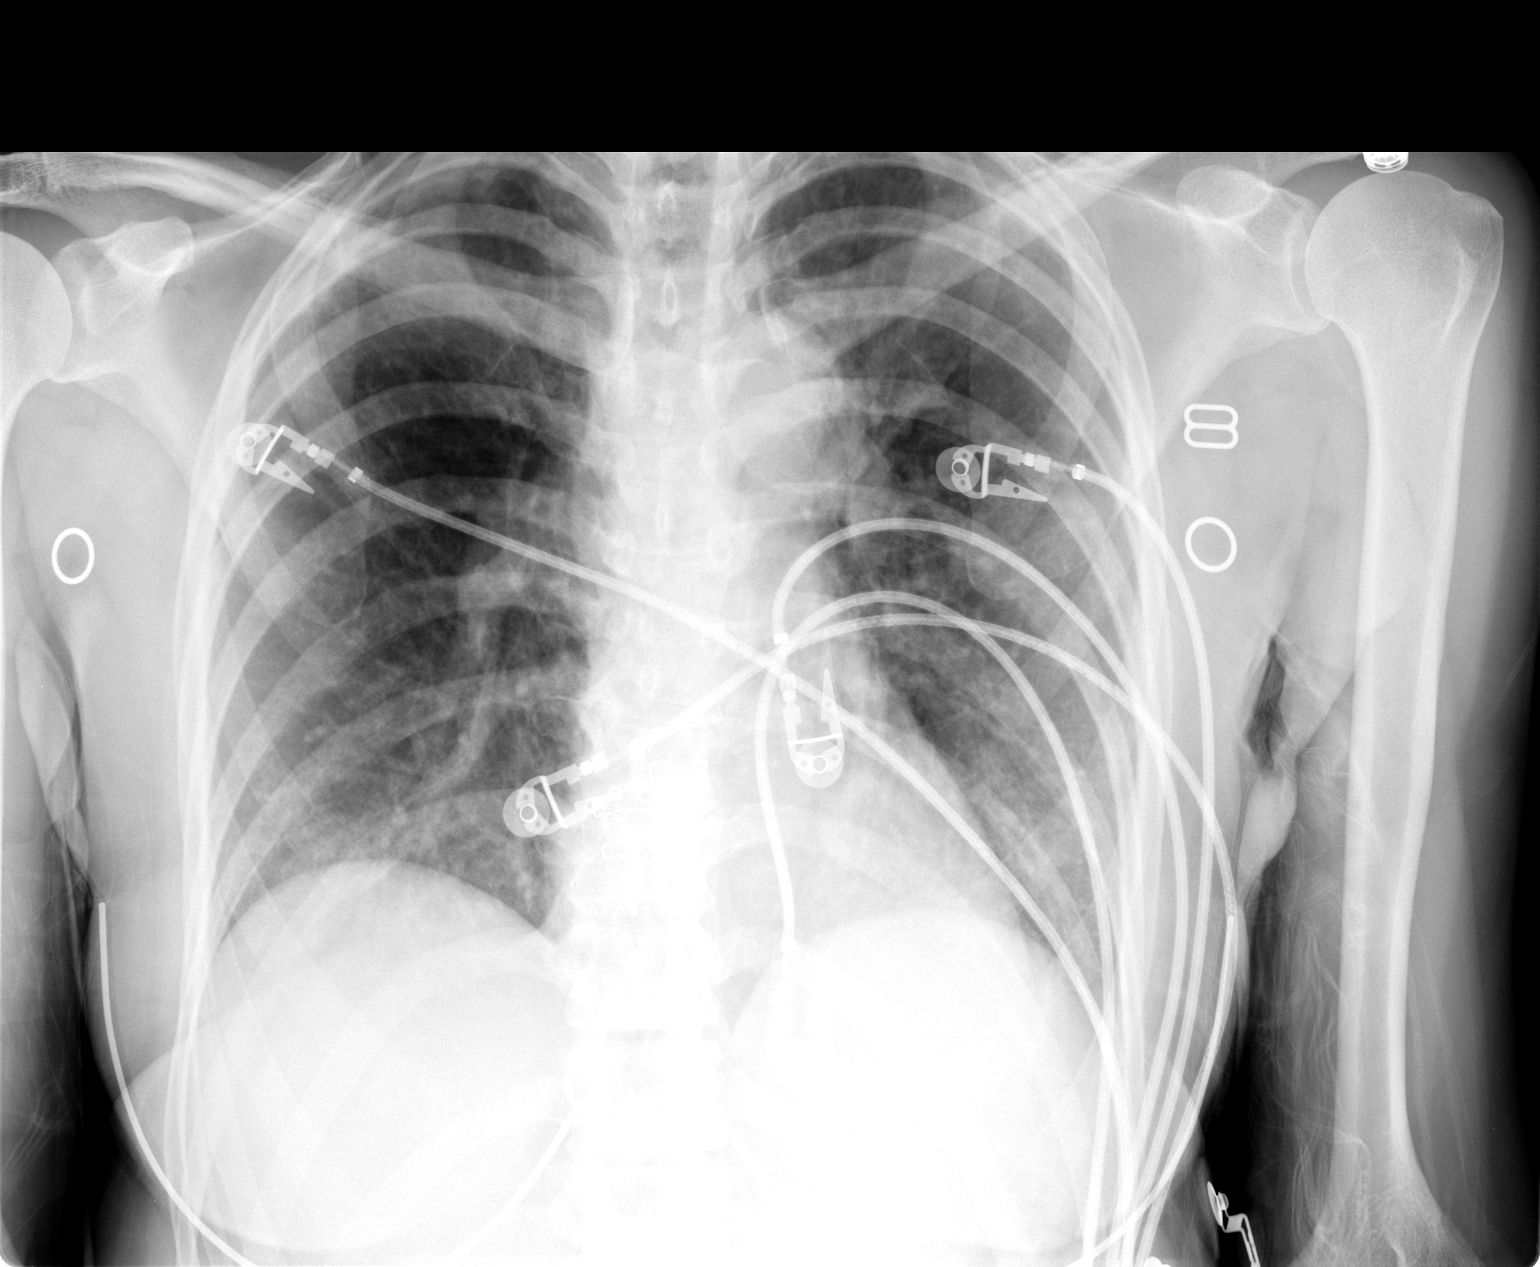

[1 of 1 positions shown; findings below may reference images not displayed]

FINDINGS: Low volume film.  Mild peribronchial thickening is again noted.  Cardiomediastinal silhouette is unremarkable.  No evidence of focal airspace disease, pneumothorax or pleural effusions.
IMPRESSION: Low volume film without evidence of acute cardiopulmonary disease.

## 2006-09-05 ENCOUNTER — Encounter: Admission: RE | Admit: 2006-09-05 | Discharge: 2006-09-05 | Payer: Self-pay | Admitting: Family Medicine

## 2006-11-27 ENCOUNTER — Encounter: Admission: RE | Admit: 2006-11-27 | Discharge: 2006-11-27 | Payer: Self-pay | Admitting: Otolaryngology

## 2006-11-27 IMAGING — CT CT PARANASAL SINUSES LIMITED
1 series · 16 of 28 positions shown, 20 images · IV contrast (agent unspecified)
Comparison: none

CLINICAL DATA: Chronic sinusitis, hemoptysis.  
 LIMITED CT OF THE PARANASAL SINUSES WITHOUT CONTRAST:
TECHNIQUE: Limited coronal CT images were obtained through the paranasal sinuses without intravenous contrast.
 Only minimal mucosal thickening is noted in the floor of the right maxillary sinus.  There is no present evidence of sinusitis.  The nasal turbinates are prominent, somewhat narrowing the nasal airway.  No bony abnormality is seen.

[Series 2: limited sinus prone · axial · 0.33mm/px · z∈[+10,+108]mm · 16 of 28 slices shown, 20 images]
[im 2/28  brain]
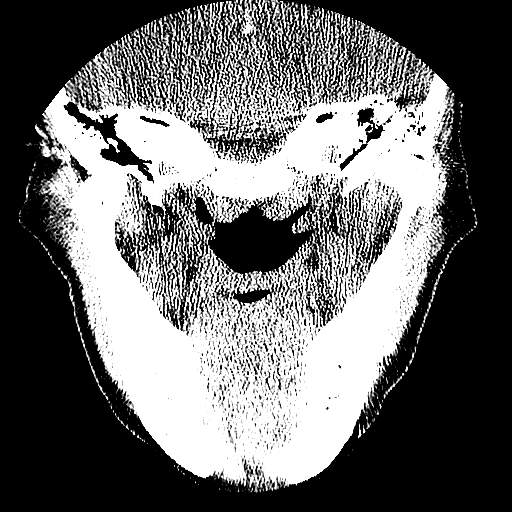
[im 2/28  bone]
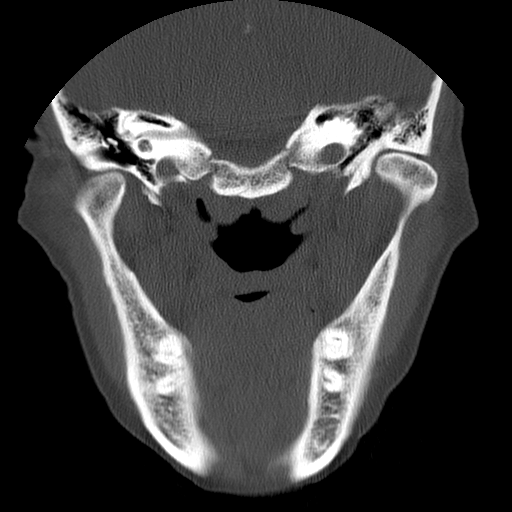
[im 4/28  bone]
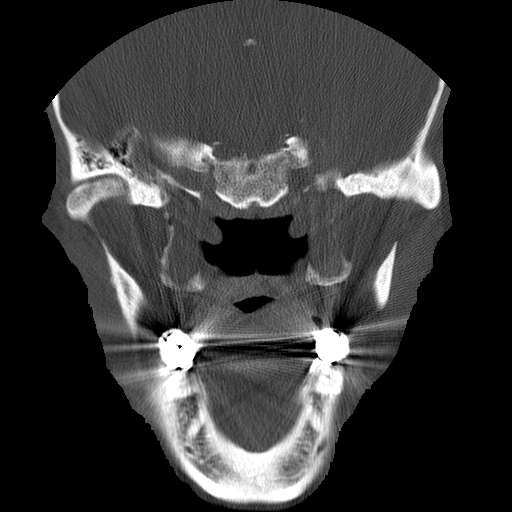
[im 6/28  bone]
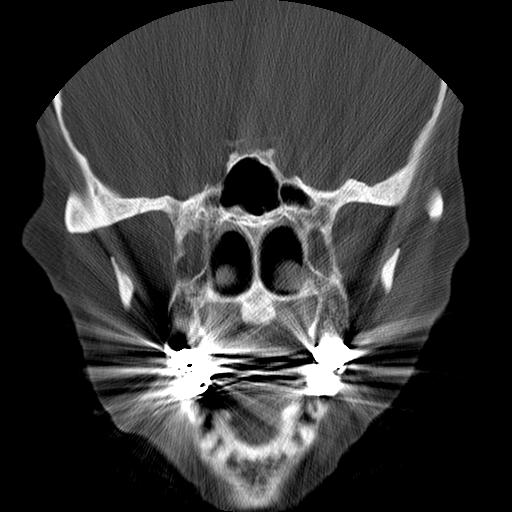
[im 7/28  bone]
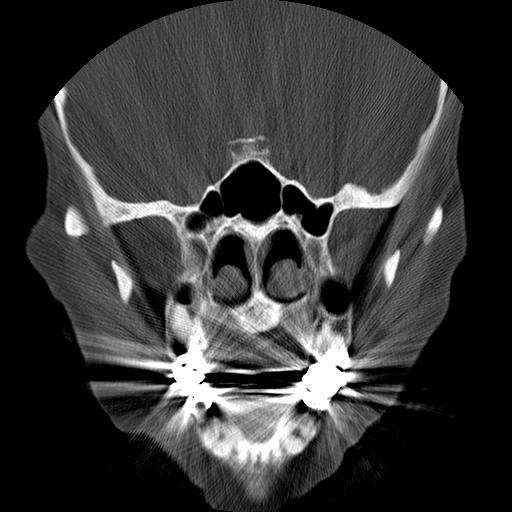
[im 9/28  brain]
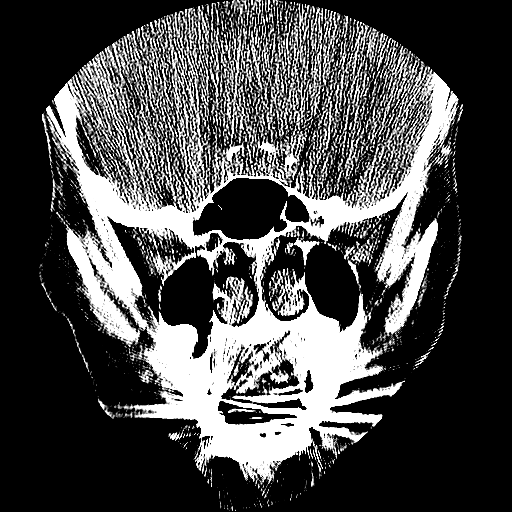
[im 9/28  bone]
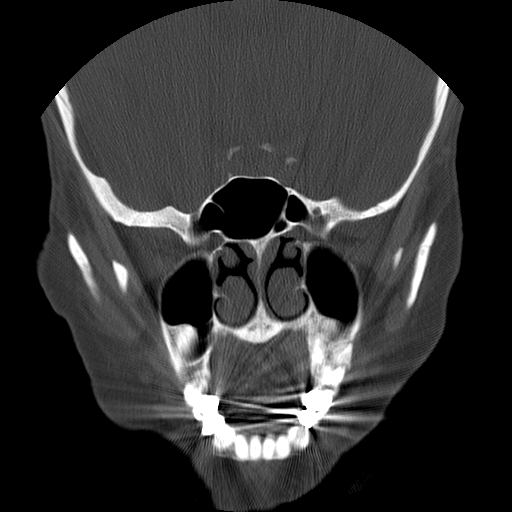
[im 10/28  bone]
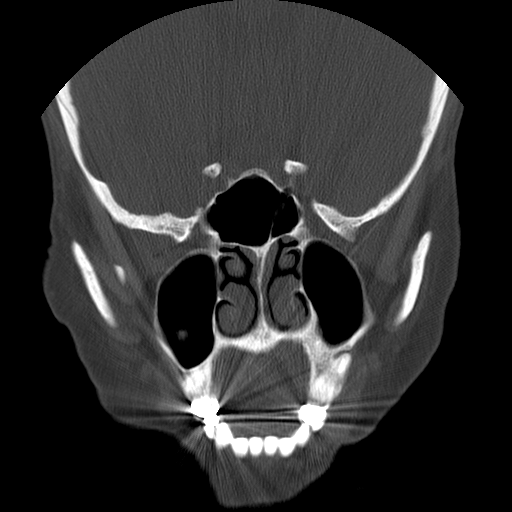
[im 12/28  bone]
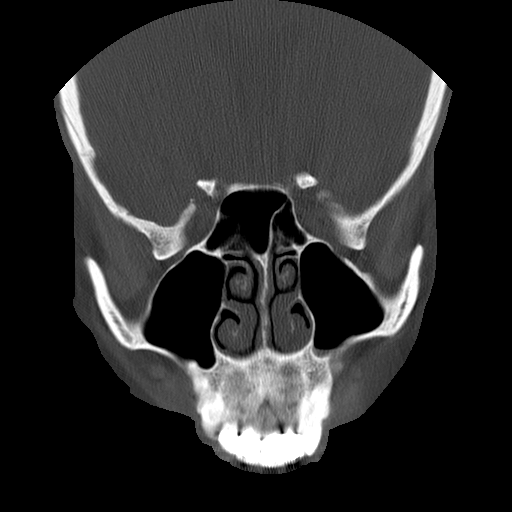
[im 14/28  bone]
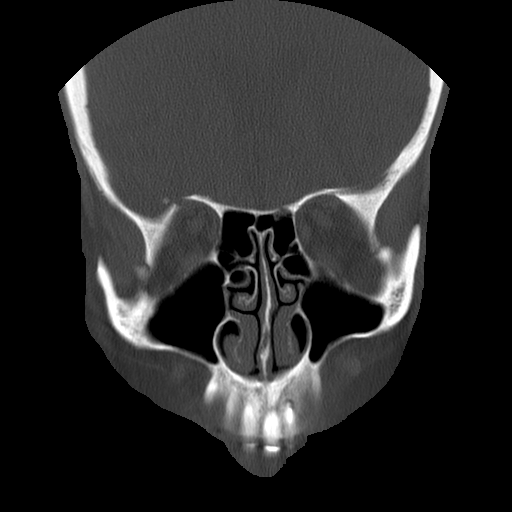
[im 15/28  brain]
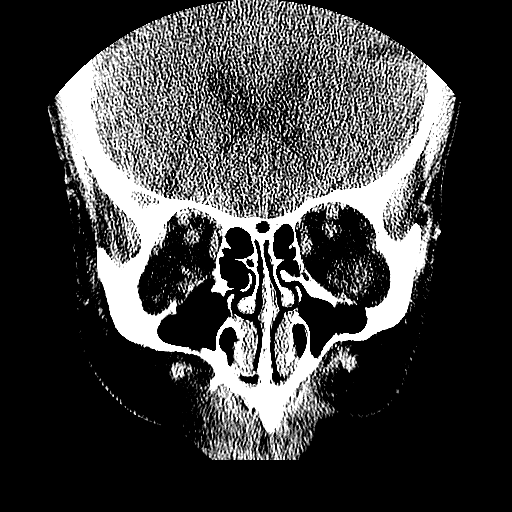
[im 15/28  bone]
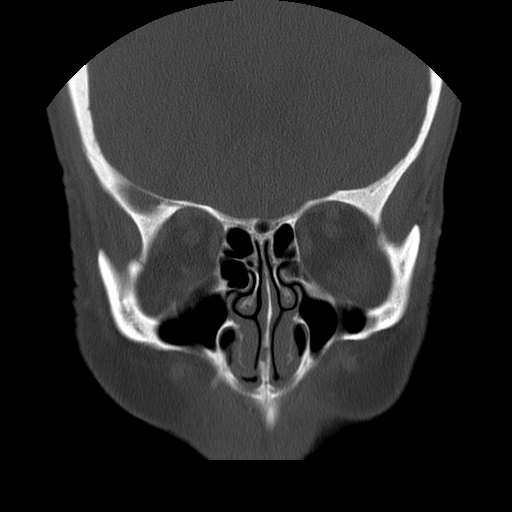
[im 17/28  bone]
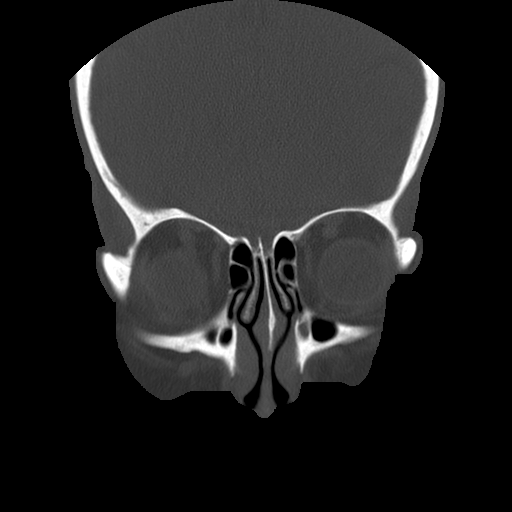
[im 19/28  bone]
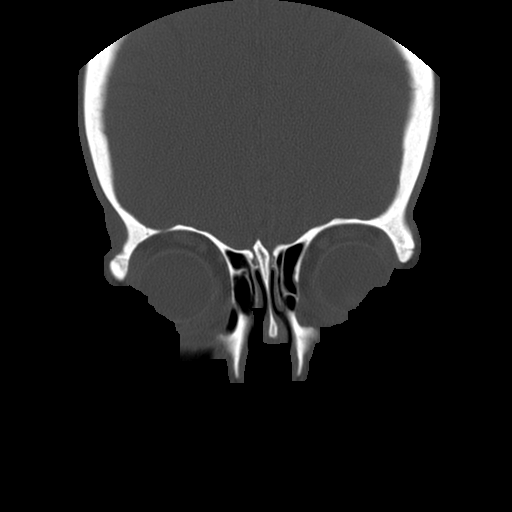
[im 20/28  bone]
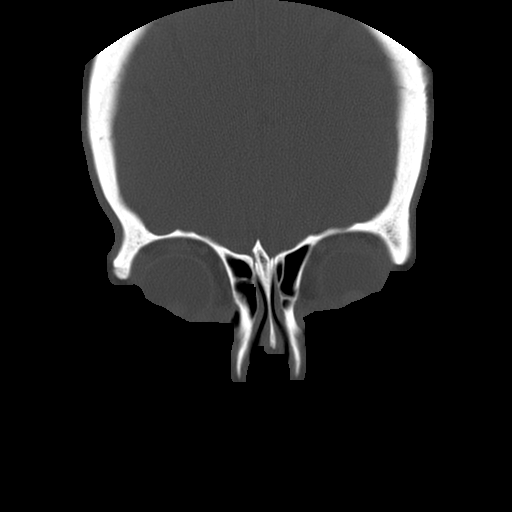
[im 22/28  brain]
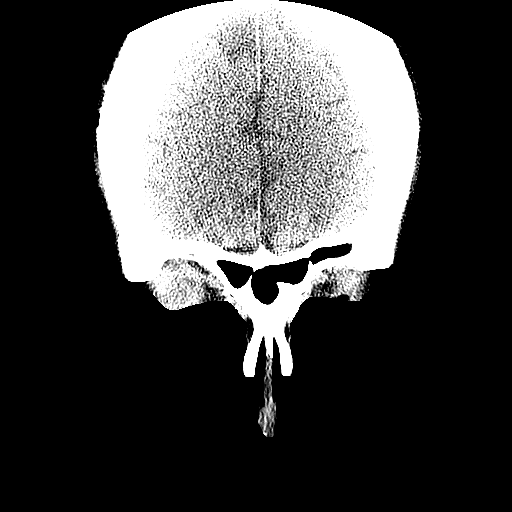
[im 22/28  bone]
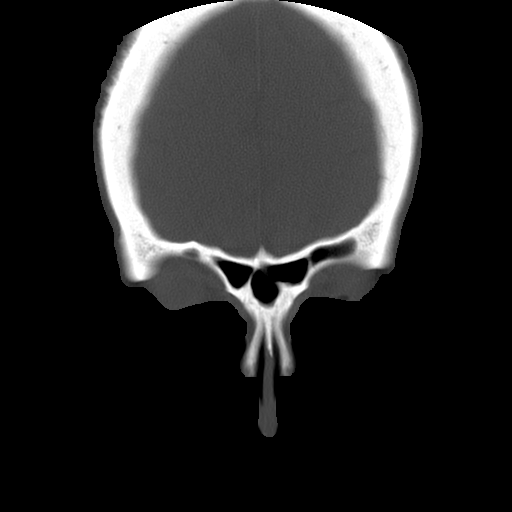
[im 23/28  bone]
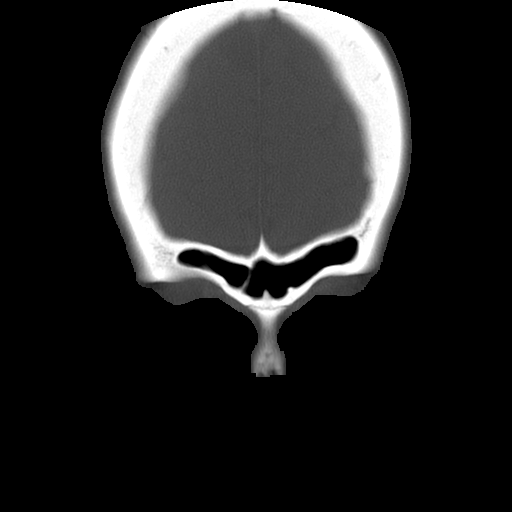
[im 25/28  bone]
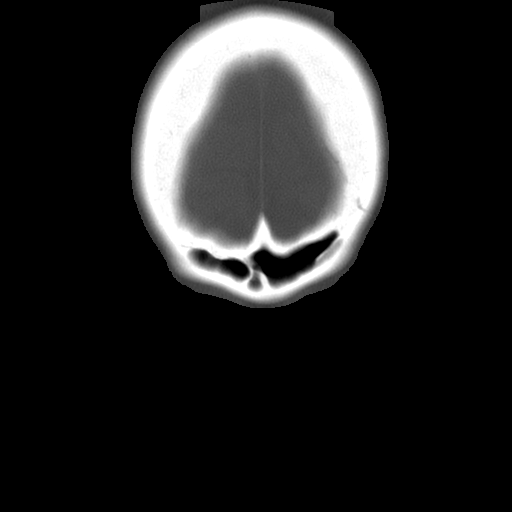
[im 27/28  bone]
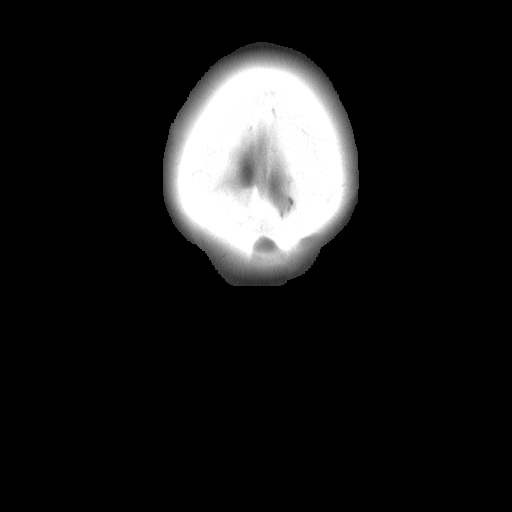

[16 of 28 positions shown; findings below may reference images not displayed]

IMPRESSION: No sinusitis.  Prominent nasal turbinates do result in some compromise of the nasal airway.

## 2006-12-17 DIAGNOSIS — E039 Hypothyroidism, unspecified: Secondary | ICD-10-CM | POA: Insufficient documentation

## 2006-12-17 DIAGNOSIS — J441 Chronic obstructive pulmonary disease with (acute) exacerbation: Secondary | ICD-10-CM | POA: Insufficient documentation

## 2006-12-17 DIAGNOSIS — K5732 Diverticulitis of large intestine without perforation or abscess without bleeding: Secondary | ICD-10-CM | POA: Insufficient documentation

## 2006-12-17 DIAGNOSIS — R918 Other nonspecific abnormal finding of lung field: Secondary | ICD-10-CM | POA: Insufficient documentation

## 2006-12-19 ENCOUNTER — Ambulatory Visit: Payer: Self-pay | Admitting: Hematology & Oncology

## 2007-01-13 LAB — CBC WITH DIFFERENTIAL/PLATELET
BASO%: 0.3 % (ref 0.0–2.0)
EOS%: 1.2 % (ref 0.0–7.0)
HCT: 38.1 % (ref 34.8–46.6)
LYMPH%: 29.5 % (ref 14.0–48.0)
MCH: 30.9 pg (ref 26.0–34.0)
MCHC: 34.1 g/dL (ref 32.0–36.0)
NEUT%: 60.4 % (ref 39.6–76.8)
Platelets: 210 10*3/uL (ref 145–400)

## 2007-01-13 LAB — IVY BLEEDING TIME: Bleeding Time: 4 Minutes (ref 2.0–8.0)

## 2007-01-27 LAB — VON WILLEBRAND PANEL: Ristocetin-Cofactor: 104 % (ref 50–150)

## 2007-01-27 LAB — PROTHROMBIN TIME: INR: 1 (ref 0.0–1.5)

## 2007-01-27 LAB — APTT: aPTT: 36 seconds (ref 24–37)

## 2007-02-20 HISTORY — PX: APPENDECTOMY: SHX54

## 2007-12-09 ENCOUNTER — Inpatient Hospital Stay (HOSPITAL_COMMUNITY): Admission: EM | Admit: 2007-12-09 | Discharge: 2007-12-10 | Payer: Self-pay | Admitting: Family Medicine

## 2007-12-09 ENCOUNTER — Encounter (INDEPENDENT_AMBULATORY_CARE_PROVIDER_SITE_OTHER): Payer: Self-pay | Admitting: General Surgery

## 2007-12-09 IMAGING — CT CT CHEST W/O CM
3 series · 17 of 30 positions shown, 19 images · non-contrast
Comparison: CT chest [DATE]

CLINICAL DATA: Abnormal chest x-ray, lung nodules

CT CHEST WITHOUT CONTRAST
TECHNIQUE: Multidetector CT imaging of the chest was performed
following the standard protocol without IV contrast. Sagittal and
coronal MPR images reconstructed from axial data set.

[Series 2: routine chest · axial · 0.65mm/px · z∈[-269,-44]mm · 7 of 63 slices shown, 9 images]
[im 9/63  mediastinal]
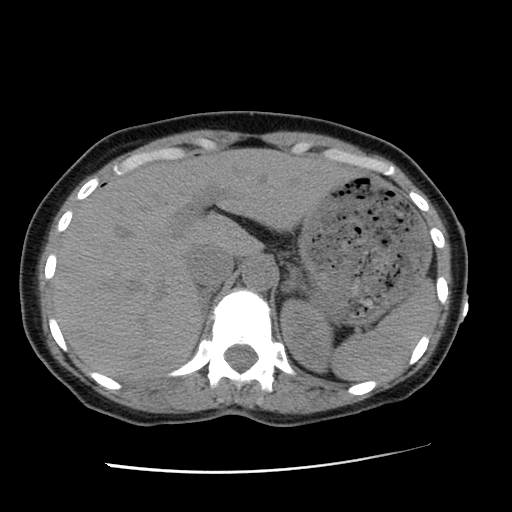
[im 9/63  lung]
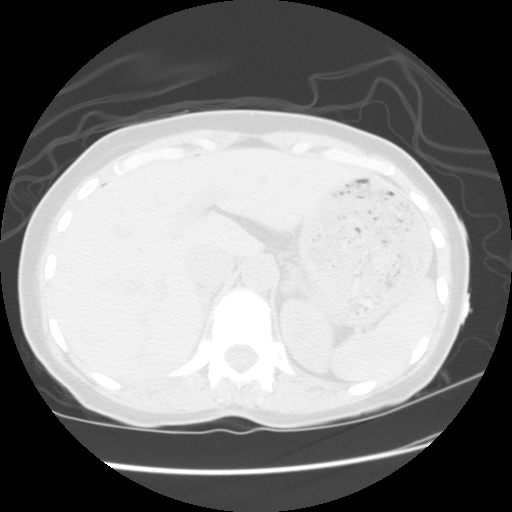
[im 18/63  lung]
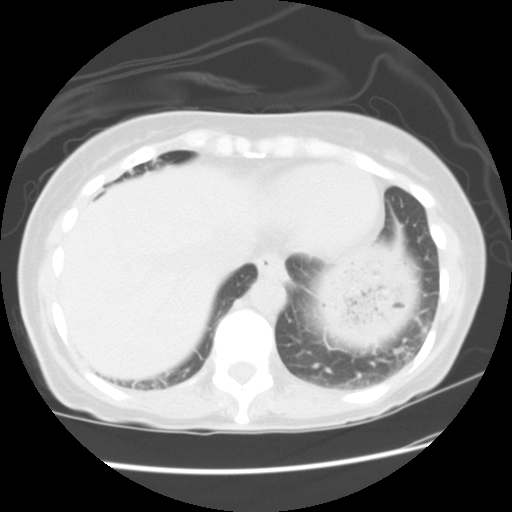
[im 27/63  lung]
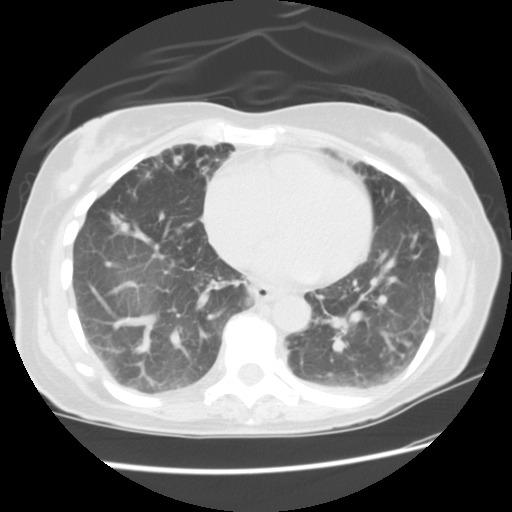
[im 32/63  lung]
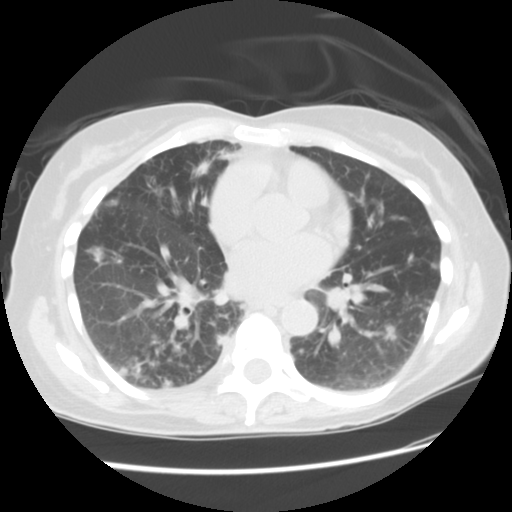
[im 36/63  mediastinal]
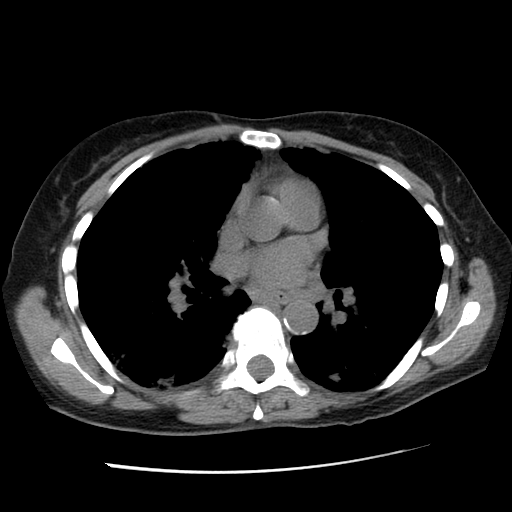
[im 36/63  lung]
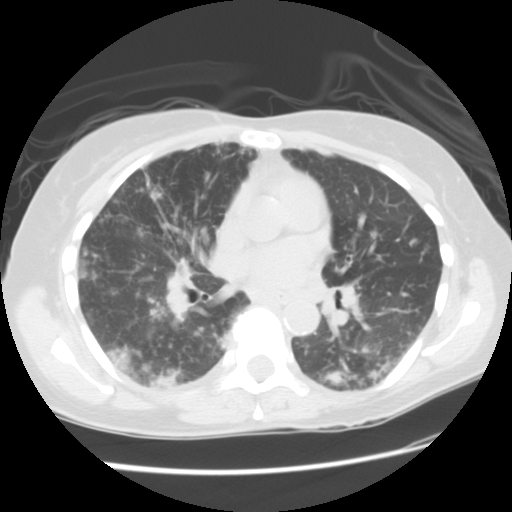
[im 45/63  lung]
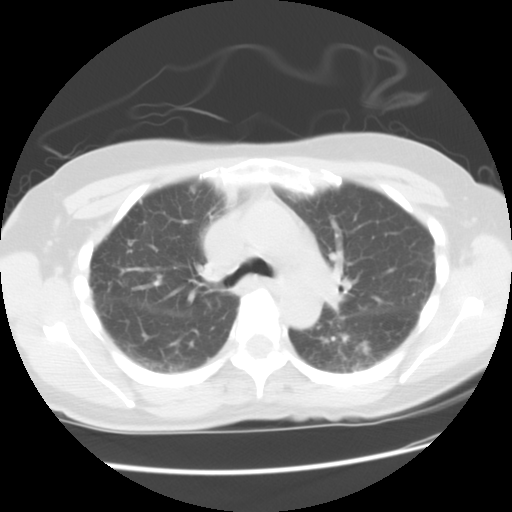
[im 54/63  lung]
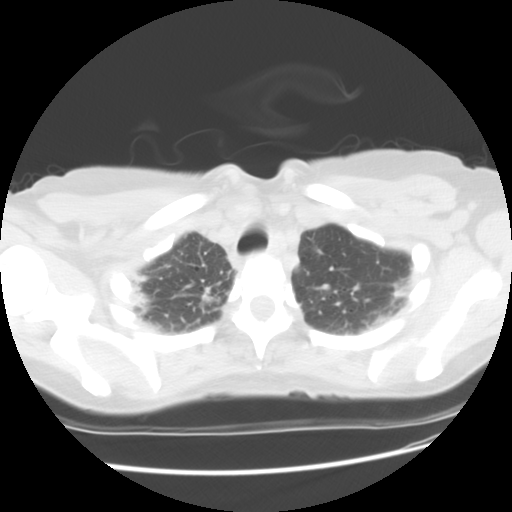

[Series 400: reformatted · sagittal · 0.65mm/px · 8 of 111 slices shown (1 of 2)]
[im 11/111  lung]
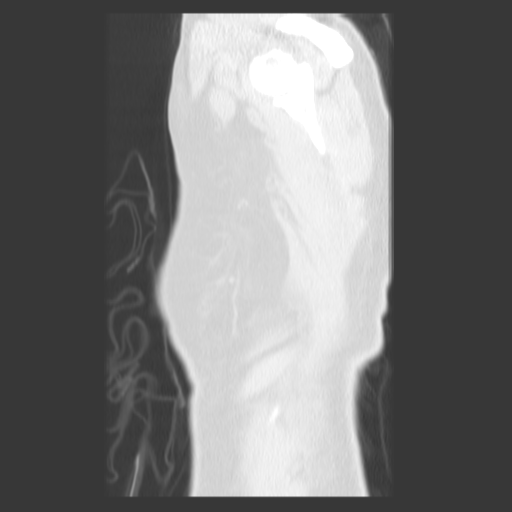
[im 31/111  lung]
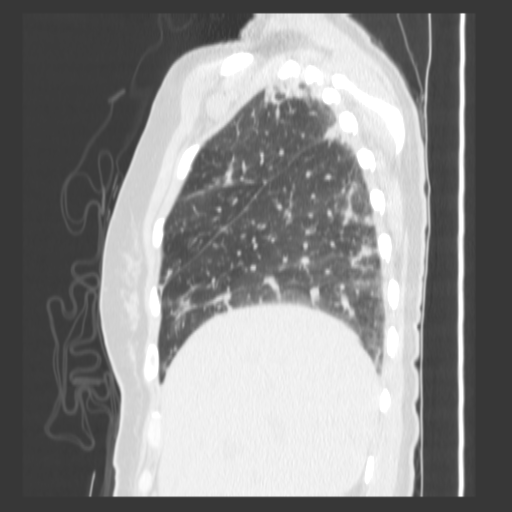
[im 41/111  lung]
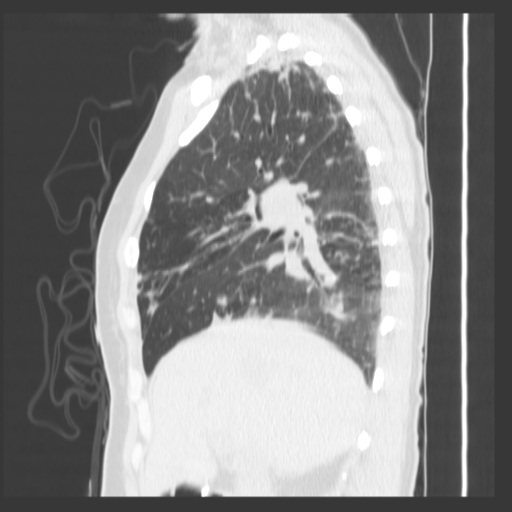
[im 51/111  lung]
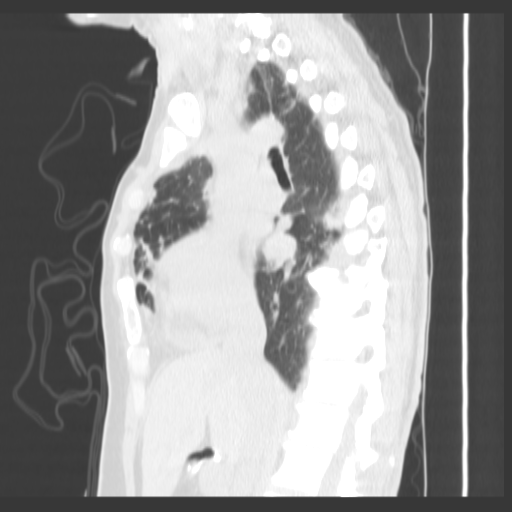
[im 61/111  lung]
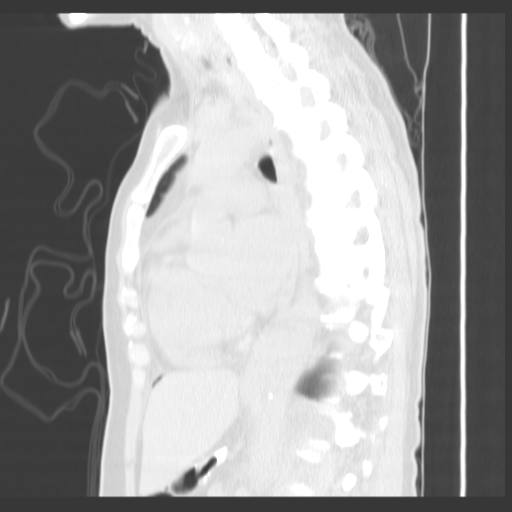
[im 71/111  lung]
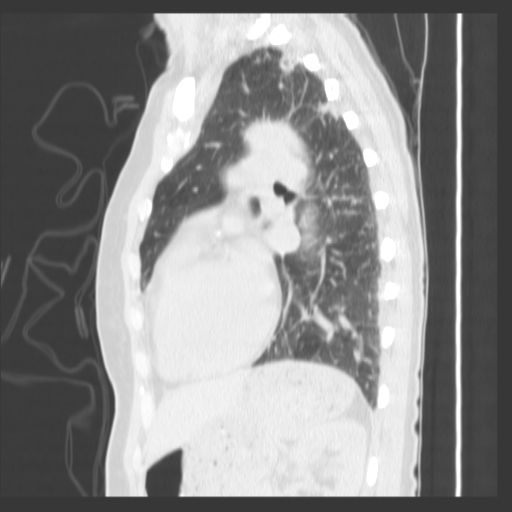
[im 81/111  lung]
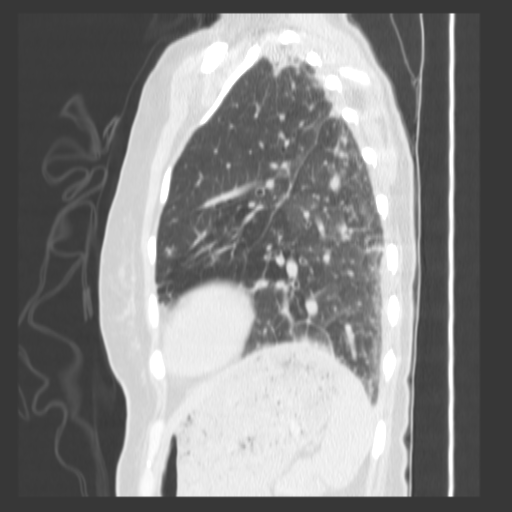
[im 101/111  lung]
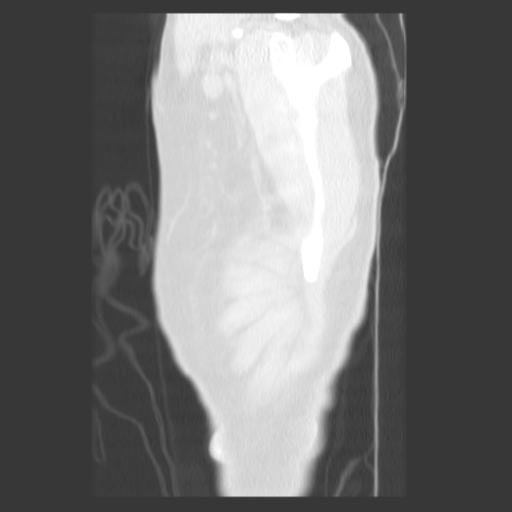

[Series 401: reformatted · coronal · 0.65mm/px · 2 of 75 slices shown (2 of 2)]
[im 11/75  lung]
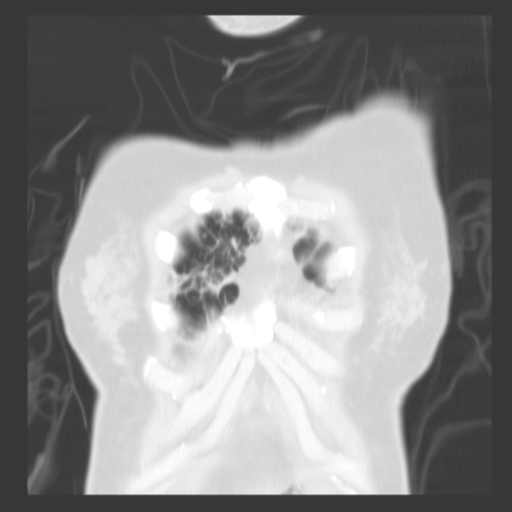
[im 22/75  lung]
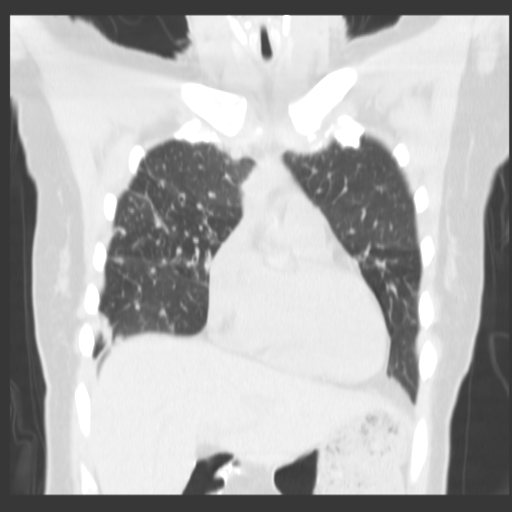

[17 of 30 positions shown; findings below may reference images not displayed]

FINDINGS: Cholelithiasis.
No definite thoracic adenopathy, hilar assessment limited by lack
of IV contrast.
Remaining visualized portions of upper abdomen normal.
No acute bony abnormalities.
Diffuse interstitial thickening throughout both lungs with stable
biapical scarring.
Numerous poorly defined nodular foci are seen throughout both
lungs, many showing poorly defined margins.
Largest lesions measure 15 x 10 mm left upper lobe image 24, 9 x 7
mm right mid lung image 33, and 11 x 10 mm right perihilar image
25.
Numerous nodules in both lungs in the 4-8 mm range.
No segmental consolidation, pleural effusion, or pneumothorax.
None of the observed nodular foci show evidence of calcification.
Scattered peribronchial thickening centrally without endobronchial
lesion.
Only a few tiny scattered nodules are seen within the lungs on the
previous exam, primarily in the mid-to-lower lungs
IMPRESSION: Scattered interstitial lung disease changes.
Numerous ill-defined nodular foci in both lungs, majority of which
are new since prior CT, cannot exclude metastatic disease with this
appearance.
Other differential diagnostic concerns however include septic
emboli, pneumoconioses, sarcoidosis, rheumatoid lung disease, TB.
Cholelithiasis.

## 2007-12-09 IMAGING — CT CT PELVIS W/ CM
2 of 5 series · 14 of 32 positions shown, 19 images · IV contrast (water/omni  & 80 ml omni 300)
Comparison: None

CT ABDOMEN

CLINICAL DATA: Abdominal pain, nausea, vomiting

CT ABDOMEN AND PELVIS WITH CONTRAST
TECHNIQUE: Multidetector CT imaging of the abdomen and pelvis was
performed using the standard protocol following bolus
administration of intravenous contrast. Sagittal and coronal MPR
images reconstructed from axial data set.
Contrast: Dilute oral contrast and 80 ml [N2]

[Series 2: routine abdomen · axial · 0.70mm/px · z∈[-424,-104]mm · 6 of 90 slices shown, 11 images]
[im 13/90  soft-tissue]
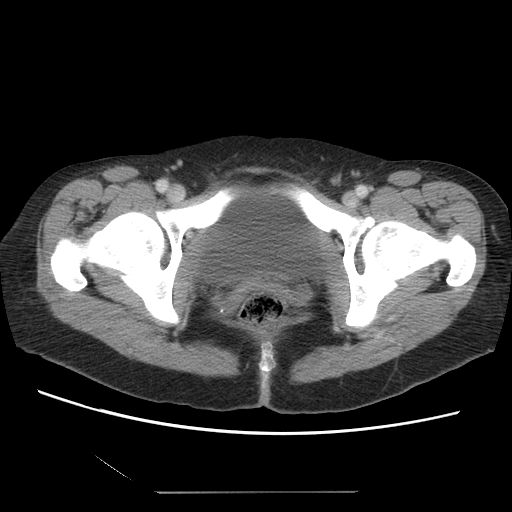
[im 13/90  bone]
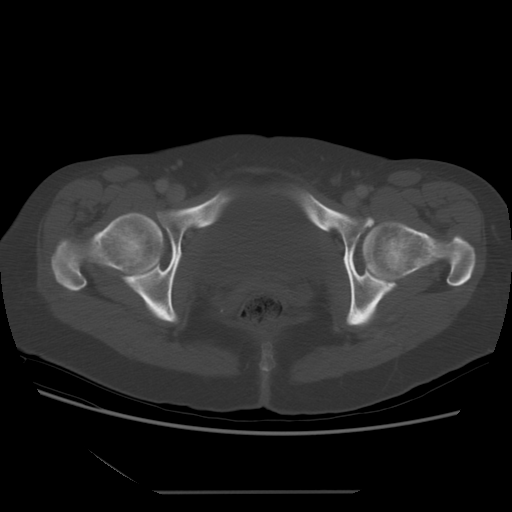
[im 26/90  soft-tissue]
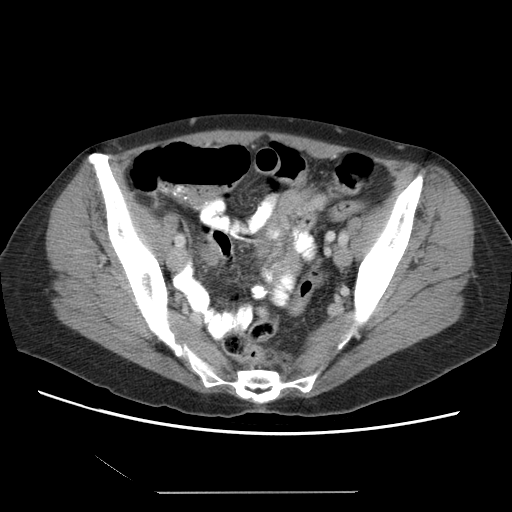
[im 39/90  soft-tissue]
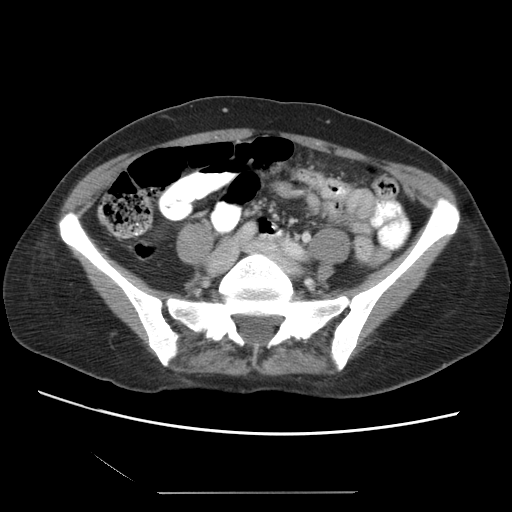
[im 39/90  lung]
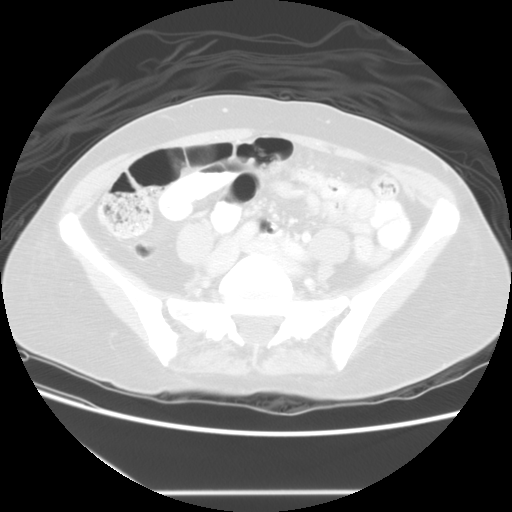
[im 51/90  soft-tissue]
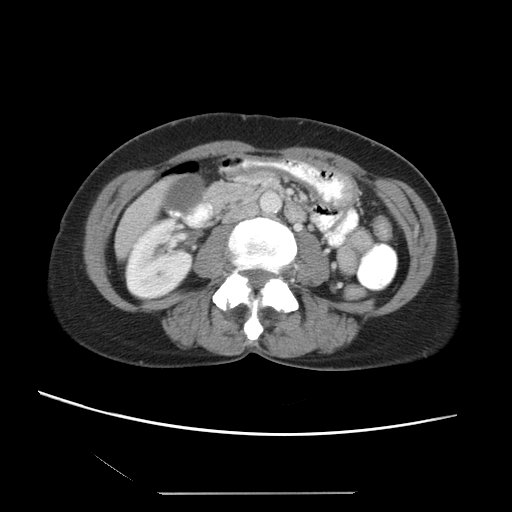
[im 51/90  lung]
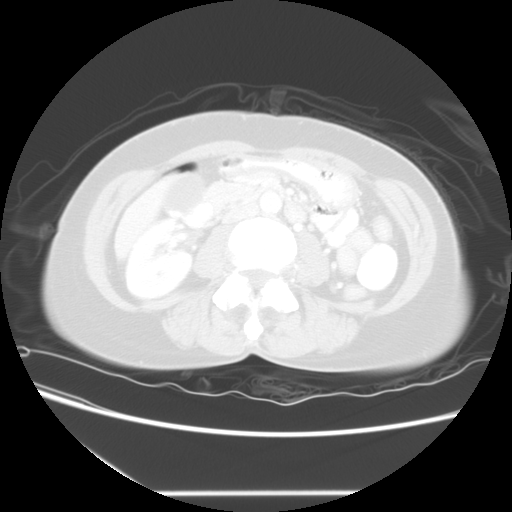
[im 64/90  soft-tissue]
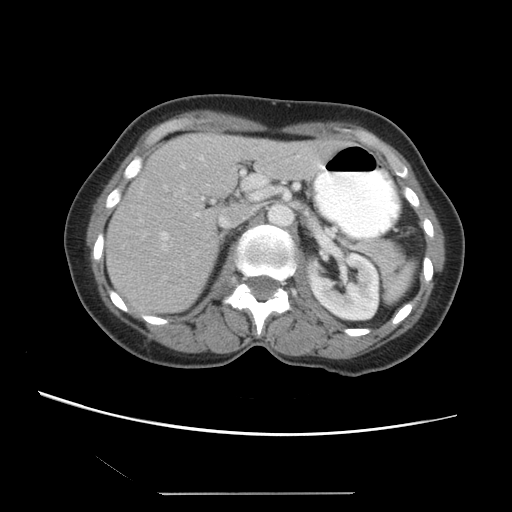
[im 64/90  lung]
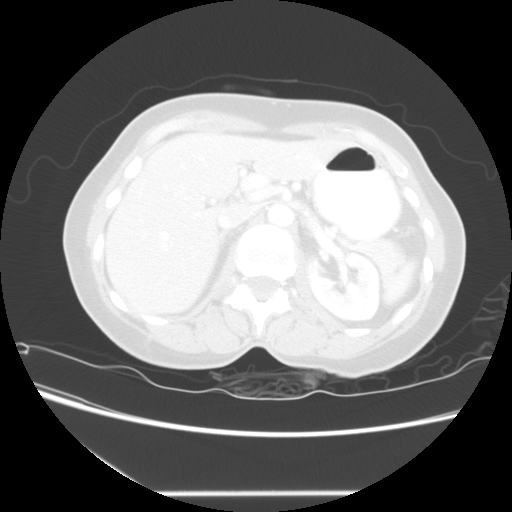
[im 77/90  soft-tissue]
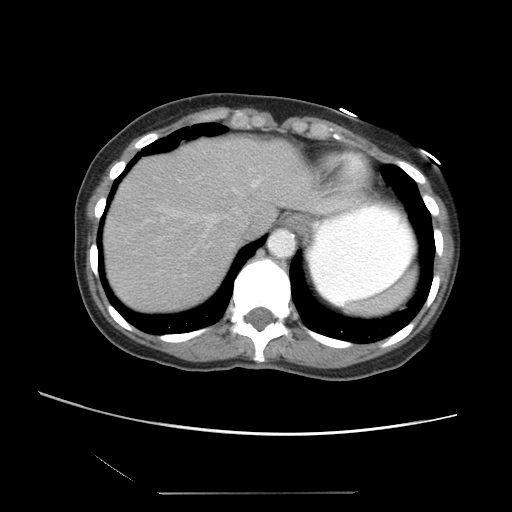
[im 77/90  lung]
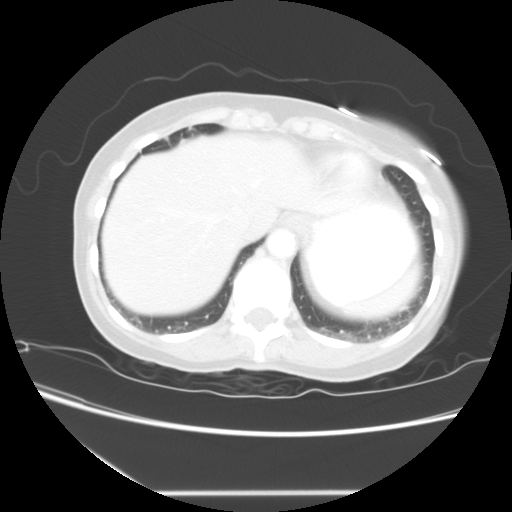

[Series 400: reformatted · sagittal · 0.90mm/px · 8 of 115 slices shown]
[im 12/115  soft-tissue]
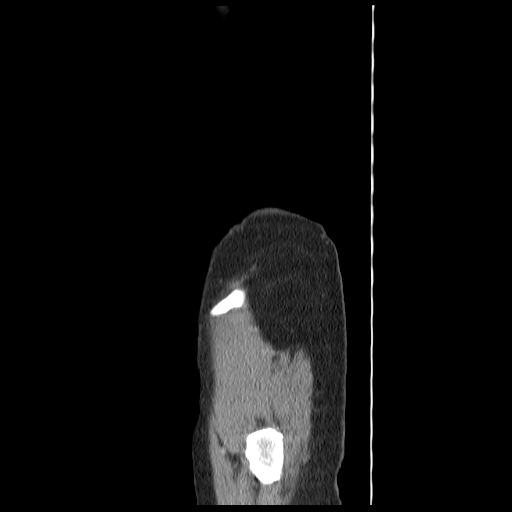
[im 23/115  soft-tissue]
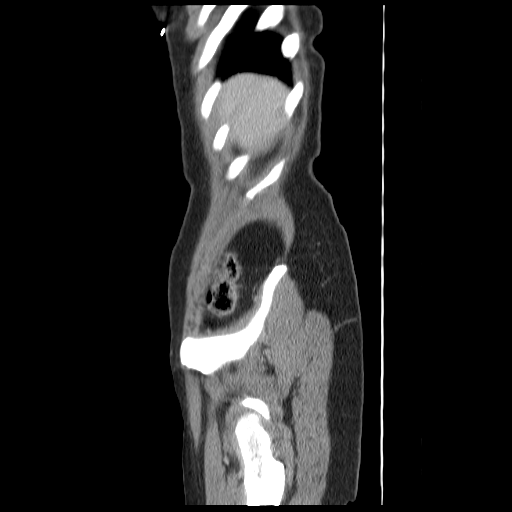
[im 35/115  soft-tissue]
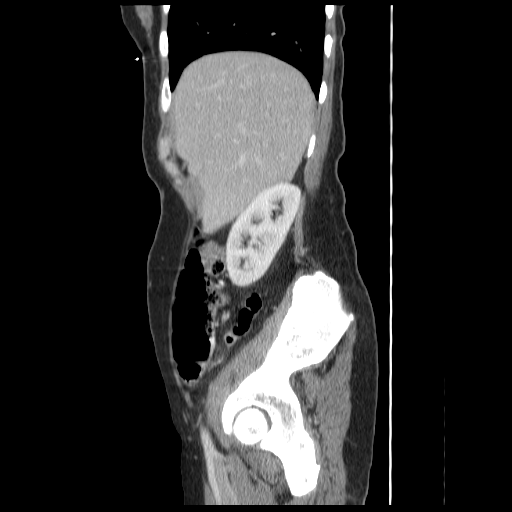
[im 46/115  soft-tissue]
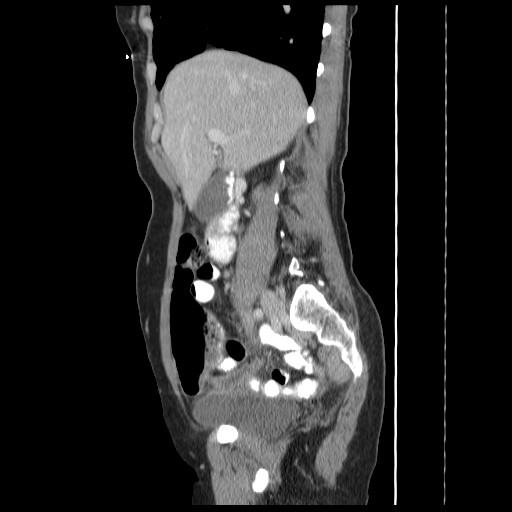
[im 69/115  soft-tissue]
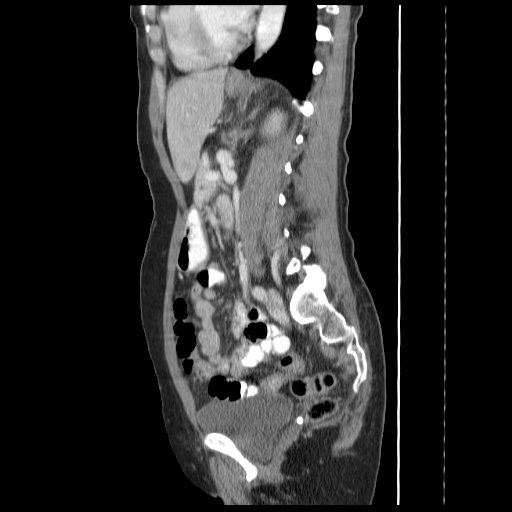
[im 80/115  soft-tissue]
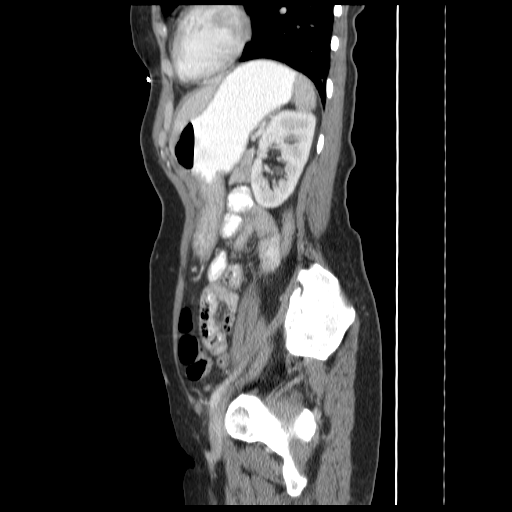
[im 92/115  soft-tissue]
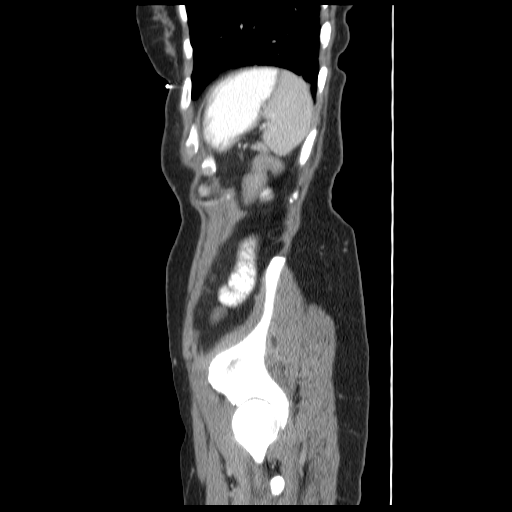
[im 103/115  soft-tissue]
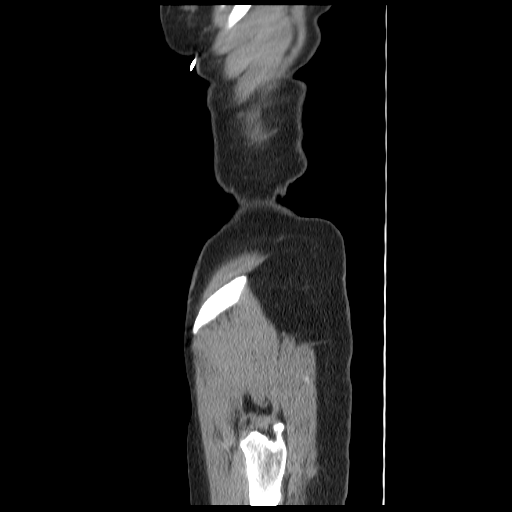

[14 of 32 positions shown; findings below may reference images not displayed]

FINDINGS: Basilar interstitial thickening with dependent atelectasis left
lower lobe and scattered subsegmental atelectasis right lung base.
8 mm diameter spiculated lesion at base of right middle lobe,
nonspecific image 7.
Additional calcified granuloma at right lower lobe base with
additional noncalcified pulmonary nodules in right lower lobe,
nonspecific.
Scattered peribronchial thickening.
Dependent calcified gallstones within gallbladder.
No focal abnormalities of liver, spleen, pancreas, kidneys, or
adrenal glands.
Stomach and upper abdominal bowel loops normal appearance.
No upper abdominal mass, adenopathy, or free fluid.
IMPRESSION: Cholelithiasis.
Nodular foci at right lung base, nonspecific in appearance, with
additional 8 mm diameter spiculated density which could represent
mass or scarring.
Follow-up dedicated CT chest with contrast recommended to assess
the lungs.

CT PELVIS
FINDINGS: Well-distended normal-appearing bladder with unremarkable distal
ureters.
Status post hysterectomy with nonvisualization of the ovaries.
Enlarged edematous appendix with periappendiceal stranding
compatible with acute appendicitis.
No evidence of perforation or abscess.
No pelvic mass, adenopathy, or free fluid.
No focal bony abnormalities.
IMPRESSION: Acute appendicitis.
This was discussed with Dr. DUNNIGAN at time of interpretation, [N2]
hours on [DATE].

## 2007-12-09 IMAGING — CR DG CHEST 2V
2 series · 2 of 2 positions shown · non-contrast
Comparison: [DATE]

CLINICAL DATA: Vomiting, appendicitis, right lung nodule

CHEST - 2 VIEW

[w chest pa]
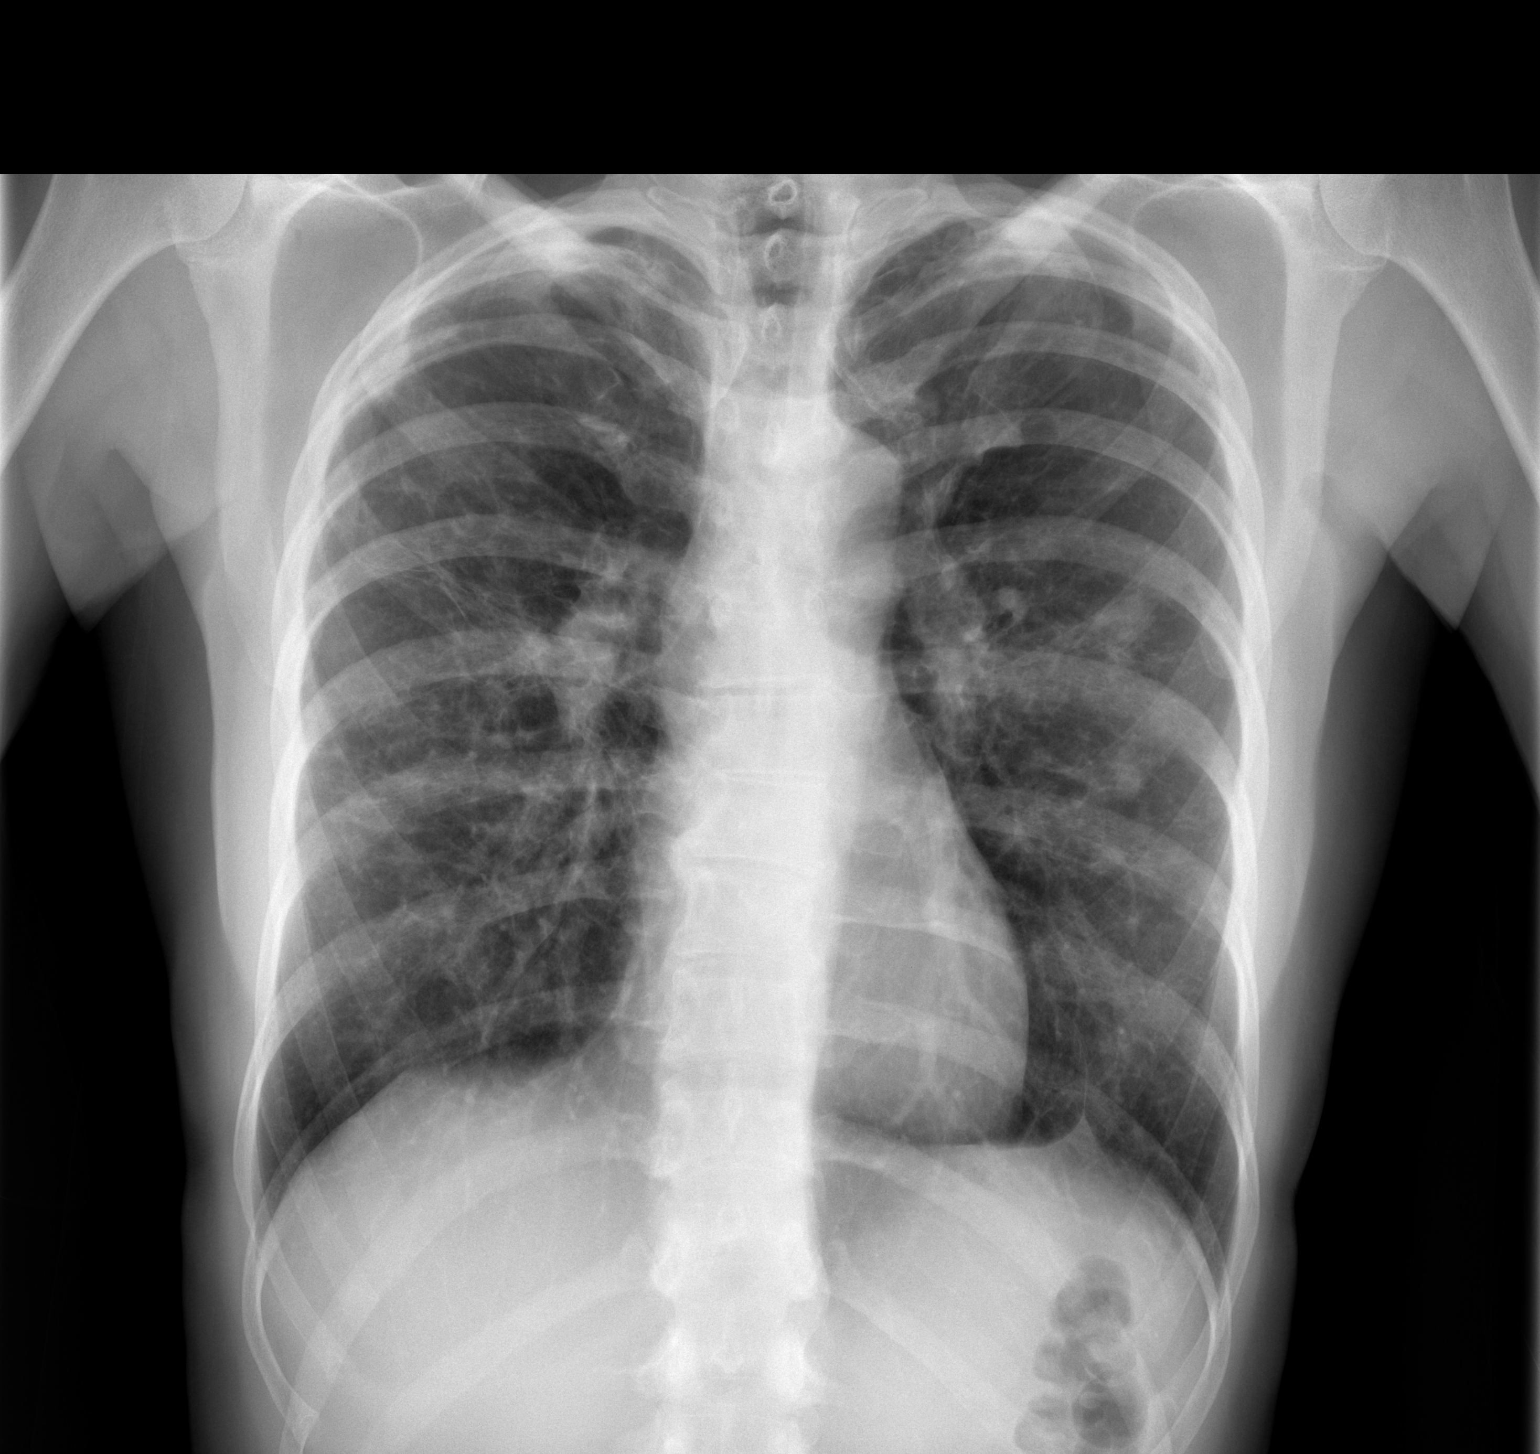

[w chest lat]
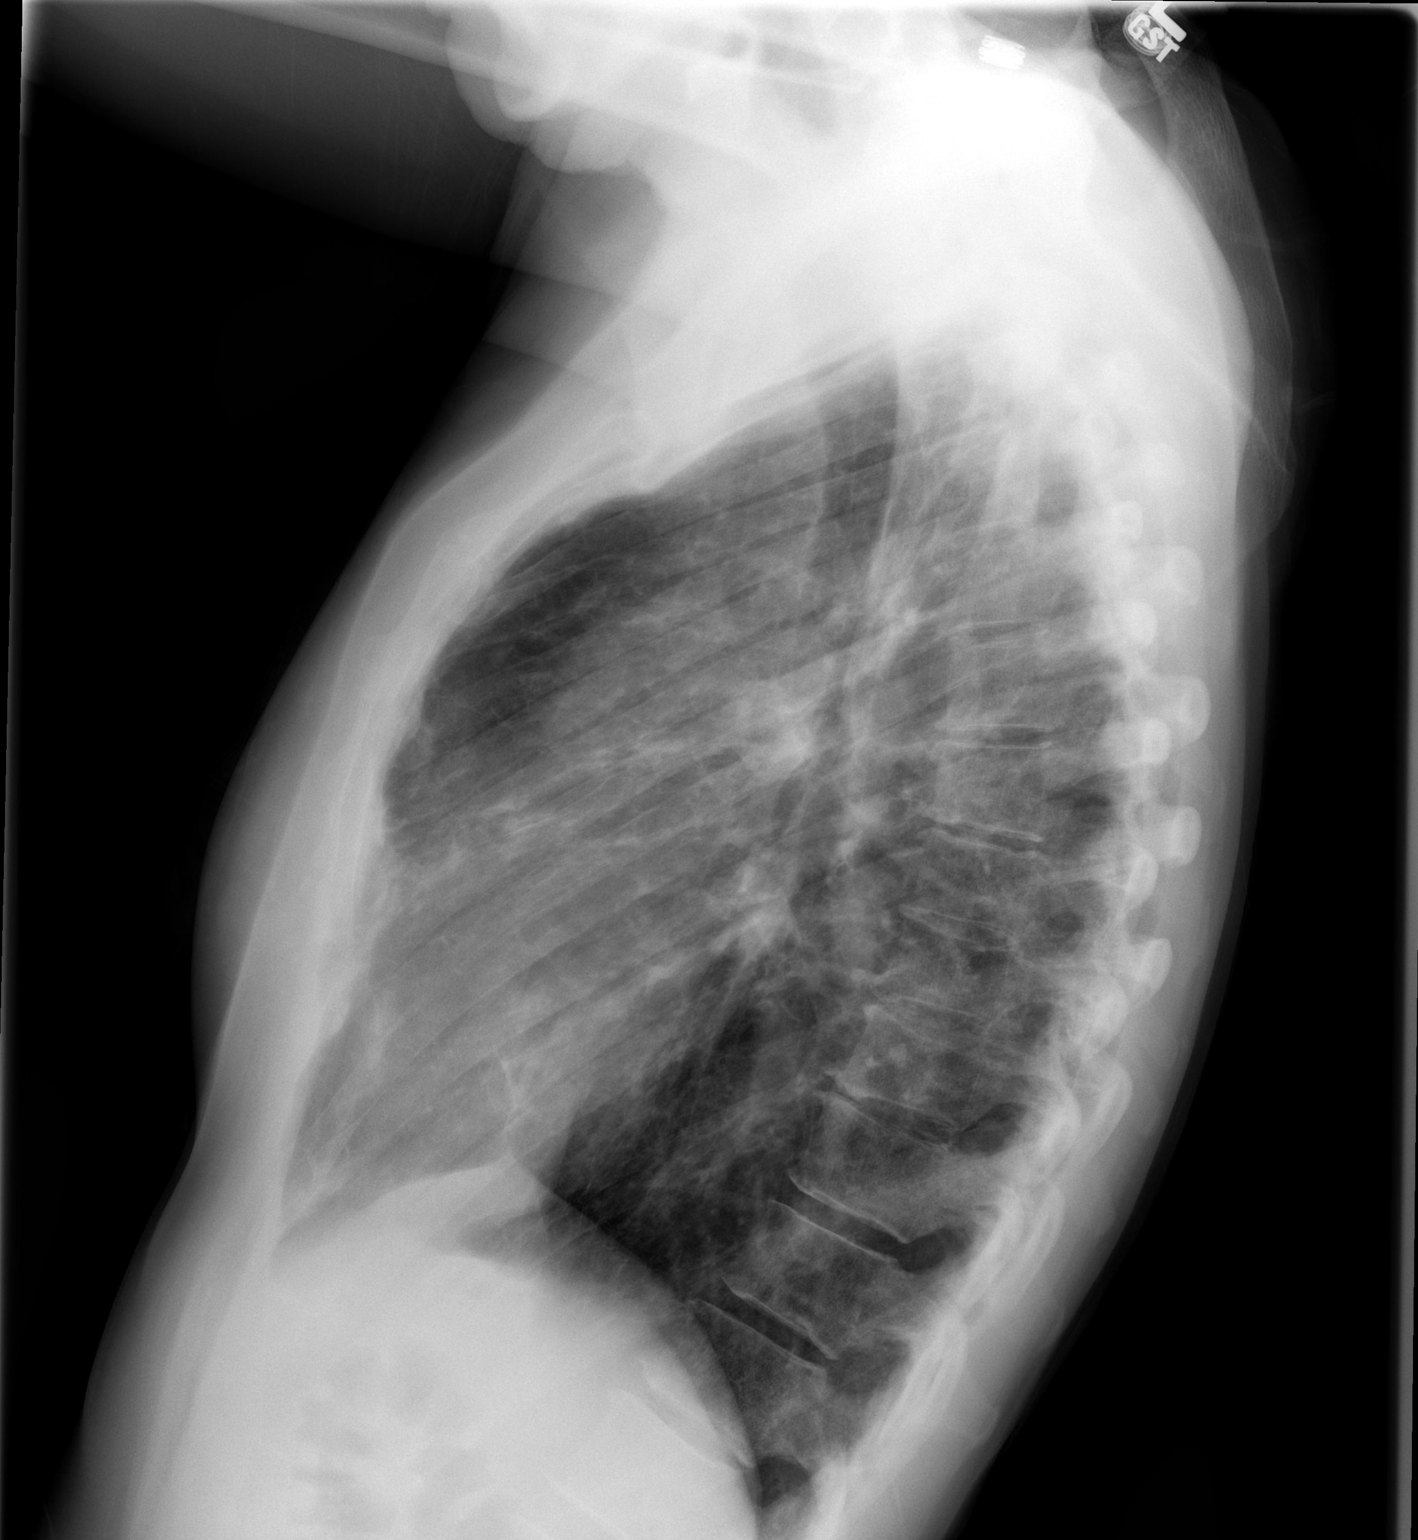

[2 of 2 positions shown; findings below may reference images not displayed]

FINDINGS: Normal heart size, mediastinal contours, and pulmonary vascularity.
Scattered nodular foci are seen in both lungs, not definitely seen
on previous exam.
These could represent metastatic lesions and follow-up assessment
of the thorax by computed tomography with contrast is recommended.
Underlying emphysematous and bronchitic changes with scattered
parenchymal scarring.
Bony demineralization.
IMPRESSION: Bronchitic and interstitial lung disease changes with question
COPD.
Scattered noncalcified pulmonary nodules, cannot exclude metastatic
disease, recommend follow-up CT chest with contrast to assess.
As patient has already received IV contrast tonight for CT
abdomen/pelvis, recommend additional contrast load delayed by 12 to
24 hours to help preserve renal function.

## 2008-06-16 ENCOUNTER — Encounter: Admission: RE | Admit: 2008-06-16 | Discharge: 2008-06-16 | Payer: Self-pay | Admitting: Family Medicine

## 2009-03-20 ENCOUNTER — Emergency Department (HOSPITAL_COMMUNITY): Admission: EM | Admit: 2009-03-20 | Discharge: 2009-03-20 | Payer: Self-pay | Admitting: Family Medicine

## 2009-06-17 ENCOUNTER — Encounter: Admission: RE | Admit: 2009-06-17 | Discharge: 2009-06-17 | Payer: Self-pay | Admitting: Family Medicine

## 2010-03-11 ENCOUNTER — Encounter: Payer: Self-pay | Admitting: Pulmonary Disease

## 2010-05-15 ENCOUNTER — Other Ambulatory Visit: Payer: Self-pay | Admitting: Family Medicine

## 2010-05-15 DIAGNOSIS — Z1231 Encounter for screening mammogram for malignant neoplasm of breast: Secondary | ICD-10-CM

## 2010-06-19 ENCOUNTER — Ambulatory Visit
Admission: RE | Admit: 2010-06-19 | Discharge: 2010-06-19 | Disposition: A | Discharge status: Home / Self Care | Payer: PRIVATE HEALTH INSURANCE | Source: Ambulatory Visit | Attending: Family Medicine | Admitting: Family Medicine

## 2010-06-19 DIAGNOSIS — Z1231 Encounter for screening mammogram for malignant neoplasm of breast: Secondary | ICD-10-CM

## 2010-06-20 ENCOUNTER — Other Ambulatory Visit: Payer: Self-pay | Admitting: Family Medicine

## 2010-06-20 DIAGNOSIS — R928 Other abnormal and inconclusive findings on diagnostic imaging of breast: Secondary | ICD-10-CM

## 2010-06-23 ENCOUNTER — Ambulatory Visit
Admission: RE | Admit: 2010-06-23 | Discharge: 2010-06-23 | Disposition: A | Discharge status: Home / Self Care | Payer: PRIVATE HEALTH INSURANCE | Source: Ambulatory Visit | Attending: Family Medicine | Admitting: Family Medicine

## 2010-06-23 ENCOUNTER — Ambulatory Visit
Admission: RE | Admit: 2010-06-23 | Discharge: 2010-06-23 | Disposition: A | Discharge status: Home / Self Care | Payer: Medicare Other | Source: Ambulatory Visit | Attending: Family Medicine | Admitting: Family Medicine

## 2010-06-23 DIAGNOSIS — R928 Other abnormal and inconclusive findings on diagnostic imaging of breast: Secondary | ICD-10-CM

## 2010-07-04 NOTE — Op Note (Signed)
[name], [name]          ACCOUNT NO.:  [id number]   MEDICAL RECORD NO.:  [id number]          PATIENT TYPE:  INP   LOCATION:  5120                         FACILITY:  [facility]   PHYSICIAN:  Adolph Pollack, M.D.DATE OF BIRTH:  [birth year]   DATE OF PROCEDURE:  12/09/2007  DATE OF DISCHARGE:                               OPERATIVE REPORT   PREOPERATIVE DIAGNOSIS:  Acute appendicitis.   POSTOPERATIVE DIAGNOSIS:  Acute appendicitis.   PROCEDURE:  Laparoscopic appendectomy.   SURGEON:  Adolph Pollack, MD   ANESTHESIA:  General.   INDICATIONS:  This 70 year old female presented to the [facility] with increasing lower abdominal pain.  She was sent to the main  emergency department.  She had leukocytosis.  CT scan was consistent  with acute appendicitis.  She is now brought to the operating room.   TECHNIQUE:  She was brought to the operating room, placed supine on the  operating table, and a general anesthetic was administered.  A Foley  catheter was inserted.  The abdominal wall was sterilely prepped and  draped.  Marcaine was infiltrated in the subumbilical region.  A  previous small subumbilical incision was reincised sharply, and the  dissection carried down through the subcutaneous tissue until the fascia  was identified.  A small incision was made in the midline fascia and the  peritoneal cavity was entered under direct vision.  A purse-string  suture was placed around the fascial edges.  A Hasson trocar was  introduced into the peritoneal cavity.  Pneumoperitoneum created.   Following this, the laparoscope was introduced.  I then placed a 5-mm  trocar in the left lower quadrant region.  I identified the appendix and  it was adherent to the distal ileum and left pelvic sidewall.  Using  careful blunt dissection, I was able to free it up from these  attachments.  There was some inflammatory change present in this area  that was of moderate degree.   A  5-mm trocar was then placed in the right upper quadrant.  The  mesoappendix was grasped at its mid portion and retracted anteriorly.  Using the Harmonic scalpel, I then divided the mesoappendix down to the  base of the cecum.  The appendix was then amputated off the cecum with a  small amount of cecum using the Endo-GIA stapler.  The appendix was then  placed in an Endopouch bag and removed through the subumbilical port.   The subumbilical trocar was then replaced.  I examined the staple line  and it was solid without evidence of bleeding.  I then copiously  irrigated out the right lower quadrant area with saline solution and  evacuated as much as possible.  I then reinspected the staple line and  it demonstrated no bleeding.   The left lower quadrant trocar was removed and no bleeding was noted.  The subumbilical trocar was removed, and the fascial defect closed under  laparoscopic revision by tightening up and tying down the purse-string  suture.  The  CO2 gas was released, and the right upper quadrant trocar removed.  The  skin incisions were  closed with 4-0 Monocryl subcuticular stitches,  followed by Steri-Strips and sterile dressings.   She tolerated the procedure without any apparent complications and was  taken to recovery in satisfactory condition.       Adolph Pollack, M.D.  Electronically Signed     TJR/MEDQ  D:  12/09/2007  T:  12/09/2007  Job:  [id number]

## 2010-07-04 NOTE — H&P (Signed)
[name], [name]          ACCOUNT NO.:  [id number]   MEDICAL RECORD NO.:  [id number]          PATIENT TYPE:  INP   LOCATION:  5120                         FACILITY:  [facility]   PHYSICIAN:  Adolph Pollack, M.D.DATE OF BIRTH:  02/17/1941   DATE OF ADMISSION:  12/08/2007  DATE OF DISCHARGE:                              HISTORY & PHYSICAL   REASON:  Acute appendicitis.   HISTORY OF PRESENT ILLNESS:  This 70 year old female had the onset of  some mild periumbilical pain on December 07, 2007, evening.  The pain  progressed throughout the evening and into the day on Monday leading her  to seek attention at the [facility] at approximately 6:30,  December 08, 2007.  She subsequently was evaluated and underwent a CT  scan which was consistent with acute appendicitis.  I was asked to see  her at that time.  She has had some nausea and vomiting and has had a  little fever while here in the emergency department.  No dysuria or  hematuria.  No diarrhea or hematochezia.   PAST MEDICAL HISTORY:  1. Hypothyroidism.  2. Vertigo.  3. Pneumonia.  4. COPD.  5. Bronchitis.  6. Diverticulosis.  7. Hypercholesterolemia.  8. UTIs.   PREVIOUS OPERATIONS:  Laparoscopic-assisted vaginal hysterectomy and  bladder suspension.   ALLERGIES:  CODEINE.   MEDICATIONS:  Synthroid, estradiol, and meclizine.   SOCIAL HISTORY:  Single and working.  Nondrinker and nonsmoker.   FAMILY HISTORY:  Positive for heart disease and hypertension.   REVIEW OF SYSTEMS:  CARDIOVASCULAR:  She denies any hypertension or  heart disease.  PULMONARY:  As per past medical history.  GI:  No peptic  ulcer disease, hepatitis, or acute diverticulitis.  GU:  No kidney  stones.  ENDOCRINE:  No diabetes mellitus.  NEUROLOGIC:  No strokes or  seizures, but does have the vertigo.  HEMATOLOGIC:  No bleeding  disorders, blood clots, or transfusions.   PHYSICAL EXAMINATION:  GENERAL:  A well-developed, well-nourished  female  appears slightly ill, but pleasant and cooperative.  VITAL SIGNS:  Temperature is 100.2, blood pressure is 128/67, pulse 90,  respiratory rate 15, O2 sats 90-98% on room air.  HEENT:  Eyes:  Extraocular motions intact.  No icterus.  NECK:  Supple without masses.  RESPIRATORY:  Breath sounds equal and clear.  Respirations unlabored.  CARDIOVASCULAR:  Regular rate.  Regular rhythm.  No murmur.  No JVD.  ABDOMEN:  Soft with small subumbilical scar.  There is right lower  quadrant tenderness to palpation and percussion.  No palpable mass.  No  hernia.  No organomegaly.  MUSCULOSKELETAL:  Good muscle tone and range of motion.  SKIN:  No jaundice.   LABORATORY DATA:  White cell count 17,100, hemoglobin 13.5.  Glucose  elevated at 144 and electrolytes within normal limits.   CT scan was reviewed and demonstrates findings consistent with acute  appendicitis.  There is also an 8-mm right lower lobe nodule.   IMPRESSION:  1. Acute appendicitis.  2. Right lower lobe nodule.  3. Cholelithiasis on CT scan.   PLAN:  IV antibiotics, laparoscopic possible  open appendectomy.  I have  discussed the procedure and the risks with her.  Risks include, but not  limited, bleeding, infection, wound healing problems, anesthesia, and  accidental damage to intra-abdominal organs.  She seems understand this  and agrees to proceed.  As per right lower lobe nodule, she will likely  need further followup of that.      Adolph Pollack, M.D.  Electronically Signed     TJR/MEDQ  D:  12/09/2007  T:  12/09/2007  Job:  [id number]   cc:   Emeterio Reeve, MD

## 2010-07-04 NOTE — Consult Note (Signed)
[name], [name]          ACCOUNT NO.:  [id number]   MEDICAL RECORD NO.:  [id number]          PATIENT TYPE:  INP   LOCATION:                               FACILITY:  [facility]   PHYSICIAN:  Angelia Mould. Derrell Lolling, M.D.DATE OF BIRTH:  June 08, 1940   DATE OF CONSULTATION:  DATE OF DISCHARGE:  12/10/2007                                 CONSULTATION   The patient is being discharged from the hospital today following an  uncomplicated recovery from her laparoscopic appendectomy.  She is to  see Dr. Abbey Chatters in 3 weeks regarding this.  In addition, she has  abnormal chest x-ray and CT scan of the chest.  She has some bilateral  scattered small pulmonary nodules.  She has been evaluated by for  something similar to this by Dr. Marcelyn Bruins in the past, and has had  bronchoscopy in the past, but no definitive diagnosis has been made.  I  sat down with her and discussed her CT scan findings.  I told her that  it had progressed.  I told her that it was extremely important that she  makes an appointment to see Dr. Marcelyn Bruins within the next week or 2  and she said she would do that.   I would like our office to be sure that she makes an appointment with  Dr. Abbey Chatters and I would like our office to also follow up to make  sure that she makes an appointment to see Dr. Marcelyn Bruins.      Angelia Mould. Derrell Lolling, M.D.  Electronically Signed     HMI/MEDQ  D:  12/10/2007  T:  12/10/2007  Job:  [id number]

## 2010-07-07 NOTE — Discharge Summary (Signed)
NAMEEARLEE, [name]          ACCOUNT NO.:  [id number]   MEDICAL RECORD NO.:  [id number]          PATIENT TYPE:  INP   LOCATION:  5120                         FACILITY:  [facility]   PHYSICIAN:  Angelia Mould. Derrell Lolling, M.D.DATE OF BIRTH:  04-19-1940   DATE OF ADMISSION:  12/08/2007  DATE OF DISCHARGE:  12/10/2007                               DISCHARGE SUMMARY   DISCHARGE PHYSICIAN:  Angelia Mould. Derrell Lolling, MD   Please note that Dr. Derrell Lolling actually dictated a partial discharge  summary under consultation note on the date of discharge that describes  issues regarding some abnormal findings on CT of the chest.   CHIEF COMPLAINT/REASON FOR ADMISSION:  Abdominal pain.   HISTORY OF PRESENT ILLNESS:  [name] is a 70 year old female  patient who developed periumbilical pain 2 days before admission.  Eventually, the pain began to radiate and became more focally located in  the right lower quadrant.  She presented to the ER where she was found  to have significant pain, low-grade fever, and leukocytosis of 13,500.  Clinical exam was concerning for appendicitis, so a CT was ordered that  demonstrated acute appendicitis, cholelithiasis, and also, a right lower  lobe nodule.  The patient was subsequently admitted with a diagnosis of  acute appendicitis.   HOSPITAL COURSE:  The patient was admitted and taken to the OR by Dr.  Abbey Chatters where she underwent laparoscopic appendectomy for an  uncomplicated, nonperforated acute appendicitis.  She did well immediate  postop period.  She was followed by Dr. Derrell Lolling, tolerating a diet.  Vital signs were stable.   Because of the finding of the right lower lobe nodule and several scalp  nodules, Dr. Derrell Lolling discussed this with the patient.  Apparently, she  has had a bronchoscopy in the past without any definite diagnosis.  She  had been followed by Dr. Marcelyn Bruins.  It was impressed upon the  patient that she should make a followup appointment with Dr.  Shelle Iron  after discharge in the next week or two so they could followup on CT  findings.  Otherwise, from a postoperative standpoint since she was  tolerating a diet and oral pain medications that it was okay for her to  discharge home and she would follow up with Dr. Abbey Chatters in a few  weeks regarding appendicitis surgery.   FINAL DISCHARGE DIAGNOSES:  1. Acute appendicitis status post laparoscopic appendectomy.  2. Recurrent right lower lobe and pulmonary nodules.   DISCHARGE MEDICATIONS:  The patient will resume the following home  medications:  1. Synthroid daily.  2. Estradiol 0.5 mg daily.  3. Meclizine 25 mg t.i.d.  4. Hormone replacement patch.   NEW MEDICATIONS:  1. Vicodin or Percocet p.r.n. pain.  It is not clear which      prescription the patient was actually given.  2. Over-the-counter laxatives as needed.   DIET:  Low-fat diet.   FOLLOWUP:  She needs to call Dr. Abbey Chatters to be seen in 2-3 weeks for  followup of appendectomy procedure.  She needs to follow up with Dr.  Shelle Iron as soon as possible and make that appointment to discuss  the  abnormal chest CT.   Additional instructions postoperative are listed on the home care  instructions for laparoscopic procedures from [facility].      Allison L. Rennis Harding, N.P.      Angelia Mould. Derrell Lolling, M.D.  Electronically Signed    ALE/MEDQ  D:  01/21/2008  T:  01/22/2008  Job:  [id number]   cc:   Adolph Pollack, M.D.  Barbaraann Share, MD,FCCP

## 2010-07-07 NOTE — Assessment & Plan Note (Signed)
[facility]                           GASTROENTEROLOGY OFFICE NOTE   NAME:WHITEHURSTShakiah, [name]                   MRN:          [id number]  DATE:12/14/2005                            DOB:          14-Jan-1941    [name] is a very nice 70 year old white female who is here for 2  issues, 1 is followup colonoscopy, last 1 being in December of 1999 and  showing diverticulosis of the left colon.  The other issue is hemoptysis  versus hematemesis for the past several years.  She has had episodes of  bright red blood, which she coughs up, usually in the middle of the night or  in the evening.  She may be lying down or sitting up.  She feels a tickle in  the throat, starts coughing, and the first thing that comes up may be bright  red blood.  She has been evaluated by Dr. Shelle Iron and was diagnosed with  COPD, possible bronchiectasis.  Referral was made to ear, nose, and throat  specialist in 2004, but the patient never saw the specialist.  They are not  sure, and she does not remember why the evaluation was not completed.  She  has occasional shortness of breath.  She denies dysphagia, odynophagia, or  any history of gastroesophageal reflux.   MEDICATIONS:  1. Synthroid 0.05 mg p.o. q. day.  2. Vivelle patch twice a week.  3. Levothyroxine 88 mcg q. day.   PAST HISTORY:  Significant for thyroid problems, arthritic complaints, sinus  trouble.  She had a hysterectomy in 1991, tubal ligation 1979.  Breast cyst  removed several years ago.   FAMILY HISTORY:  Positive for heart disease in mother and father.  Alcoholism in father.   SOCIAL HISTORY:  She is single, has 4 children.  The patient does not smoke.  Does not drink alcohol.   REVIEW OF SYSTEMS:  Positive for allergies, weakened cough.  Arthritis.  Coughing up blood.  Leakage of urine.   PHYSICAL EXAM:  Blood pressure 138/60, pulse 58, and weight 133 pounds.  She was alert and oriented in no  acute distress.  The voice was normal.  No hoarseness or raspiness.  Oral cavity was normal.  Tongue was papillated.  NECK:  Supple.  No adenopathy.  LUNGS:  Clear to auscultation.  No rales or wheezes.  COR:  Normal S1, normal S2.  ABDOMEN:  Negative.  RECTAL:  Not done.   IMPRESSION:  64. A 70 year old white female with hemoptysis versus hematemesis, likely      respiratory problem, rule out upper gastrointestinal reflux with      esophagitis.  Rule out esophageal web or Barrett's esophageus.  There      is historically no symptoms suggestive of gastroesophageal reflux or      esophageal stricture.  She brought with her specimen of blood that she      coughed up 4 days ago.  It has about a half a tablespoon of bright red      blood.  2. History of diverticulosis, as well as colon.  The patient is due for  colonoscopy, last 1 was in 1999.   PLAN:  Colonoscopy and endoscopy scheduled.  Depending on the timing, she  may need a referral to ENT specialist.  Also she may need to be put on  proton pump inhibitor if we decided her symptoms are related to  gastroesophageal reflux.     Hedwig Morton. Juanda Chance, MD    DMB/MedQ  DD: 12/14/2005  DT: 12/16/2005  Job #: [id number]   cc:   Dr Pamelia Hoit

## 2010-08-01 ENCOUNTER — Ambulatory Visit (INDEPENDENT_AMBULATORY_CARE_PROVIDER_SITE_OTHER): Payer: Medicare Other | Admitting: Internal Medicine

## 2010-08-01 ENCOUNTER — Encounter: Payer: Self-pay | Admitting: Internal Medicine

## 2010-08-01 VITALS — BP 136/76 | HR 82 | Ht 68.0 in | Wt 141.4 lb

## 2010-08-01 DIAGNOSIS — J479 Bronchiectasis, uncomplicated: Secondary | ICD-10-CM | POA: Insufficient documentation

## 2010-08-01 DIAGNOSIS — R042 Hemoptysis: Secondary | ICD-10-CM

## 2010-08-01 DIAGNOSIS — Z23 Encounter for immunization: Secondary | ICD-10-CM

## 2010-08-01 MED ORDER — PNEUMOCOCCAL VAC POLYVALENT 25 MCG/0.5ML IJ INJ
0.5000 mL | INJECTION | Freq: Once | INTRAMUSCULAR | Status: AC
  Administered 2010-08-01: 0.5 mL via INTRAMUSCULAR

## 2010-08-01 NOTE — Patient Instructions (Signed)
Orders--     CT chest with contrast- dx hemoptysis, bronchiectasis      Lab----     BMET  For renal function for CT                                             Schedule PFT                      Screen for a1AT deficiency  Pneumovax

## 2010-08-01 NOTE — Assessment & Plan Note (Signed)
Hx bronchitis. Bronchiectasis described on 2005 CT, so likley this is acute exacerbation of bronchiectasis. Since she hasn't bled since April, I won't treat now. We will update CT, PFT and give pneumovax.

## 2010-08-01 NOTE — Progress Notes (Signed)
  Subjective:    Patient ID: [name], female    DOB: [birth year], 70 y.o.   MRN: [id number]  HPI 08/01/10- 19 yo F never smoker with hx bronchitis and chronic recurrent hemoptysis. Referred courtesy of Dr Mila Palmer. Has noted blood from mouth, mostly at night after she has been asleep, intermittently since at least 2003. Previous ENT eval by Dr Gerilyn Pilgrim, Bronchoscopy by Dr Tanda Rockers as well as pulmonary evaluation by Dr Shelle Iron. No source found. She put herself on aspirin as a self-imposed health measure in April and noted onset of bleeding again about a month after that. Self-limited then and no recurrence since.. Denies fever, sweat, nodes, weight loss or chest pain.  CT chest 08/20/03 - advanced COPD with bronchiectasis and blebs.  Lived in [place] for 5 months in 2005- bleeding preceded that trip. Remote pneumonia. PPD negative.  Review of Systems Constitutional:   No weight loss, night sweats,  Fevers, chills, fatigue, lassitude. HEENT:   No headaches,  Difficulty swallowing,  Tooth/dental problems,  Sore throat,                No sneezing, itching, ear ache, nasal congestion, post nasal drip,   CV:  No chest pain,  Orthopnea, PND, swelling in lower extremities, anasarca, dizziness, palpitations  GI  No heartburn, indigestion, abdominal pain, nausea, vomiting, diarrhea, change in bowel habits, loss of appetite  Resp: No shortness of breath with exertion or at rest.  No excess mucus, no productive cough,  No non-productive cough,  No change in color of mucus.  No wheezing.  Skin: no rash or lesions.  GU: no dysuria, change in color of urine, no urgency or frequency.  No flank pain.  MS:  No joint pain or swelling.  No decreased range of motion.  No back pain.  Psych:  No change in mood or affect. No depression or anxiety.  No memory loss.      Objective:   Physical Exam General- Alert, Oriented, Affect-appropriate, Distress- none acute  Skin- rash-none, lesions- none,  excoriation- none  Lymphadenopathy- none  Head- atraumatic  Eyes- Gross vision intact, PERRLA, conjunctivae clear, secretions  Ears- Hearing, canals, Tm- normal  Nose- Clear, No-Septal dev, mucus, polyps, erosion, perforation   Throat- Mallampati II , mucosa clear , drainage- none, tonsils- atrophic  Neck- flexible , trachea midline, no stridor , thyroid nl, carotid no bruit  Chest - symmetrical excursion , unlabored     Heart/CV- RRR , no murmur , no gallop  , no rub, nl s1 s2                     - JVD- none , edema- none, stasis changes- none, varices- none     Lung- clear to P&A, wheeze- none, cough- none , dullness-none, rub- none     Chest wall-  Abd- tender-no, distended-no, bowel sounds-present, HSM- no  Br/ Gen/ Rectal- Not done, not indicated  Extrem- cyanosis- none, clubbing, none, atrophy- none, strength- nl  Neuro- grossly intact to observation         Assessment & Plan:

## 2010-08-02 ENCOUNTER — Other Ambulatory Visit (INDEPENDENT_AMBULATORY_CARE_PROVIDER_SITE_OTHER): Payer: Medicare Other

## 2010-08-02 ENCOUNTER — Ambulatory Visit (INDEPENDENT_AMBULATORY_CARE_PROVIDER_SITE_OTHER)
Admission: RE | Admit: 2010-08-02 | Discharge: 2010-08-02 | Disposition: A | Discharge status: Home / Self Care | Payer: Medicare Other | Source: Ambulatory Visit | Attending: Internal Medicine | Admitting: Internal Medicine

## 2010-08-02 DIAGNOSIS — R042 Hemoptysis: Secondary | ICD-10-CM

## 2010-08-02 DIAGNOSIS — J479 Bronchiectasis, uncomplicated: Secondary | ICD-10-CM

## 2010-08-02 LAB — BASIC METABOLIC PANEL
BUN: 14 mg/dL (ref 6–23)
CO2: 29 mEq/L (ref 19–32)
Chloride: 103 mEq/L (ref 96–112)
GFR: 75.09 mL/min (ref >60.00)
Glucose, Bld: 82 mg/dL (ref 70–99)
Potassium: 3.7 mEq/L (ref 3.5–5.1)

## 2010-08-02 IMAGING — CT CT CHEST W/ CM
2 of 3 series · 15 of 36 positions shown, 18 images · IV contrast (Omnipaque 300)
Comparison: [DATE].

CLINICAL DATA: Hemoptysis and bronchiectasis with worsening.
Shortness of breath.

CT CHEST WITH CONTRAST
TECHNIQUE: Multidetector CT imaging of the chest was performed
following the standard protocol during bolus administration of
intravenous contrast.
Contrast: 80 ml [HI].

[Series 2: chest routine with · axial · 0.62mm/px · z∈[-332,-52]mm · 12 of 66 slices shown, 15 images]
[im 5/66  mediastinal]
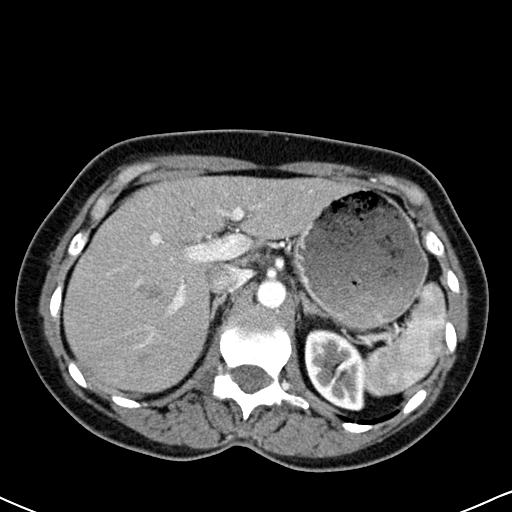
[im 5/66  lung]
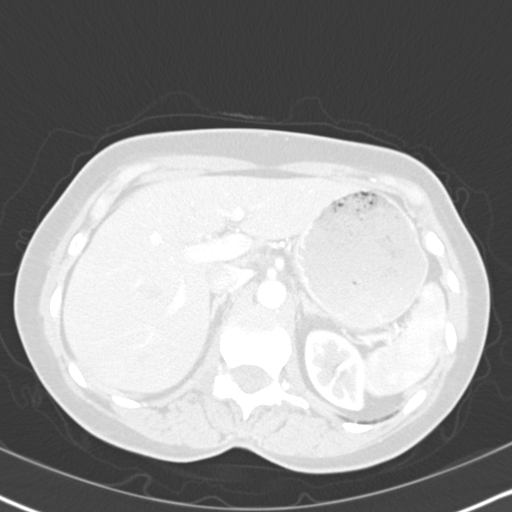
[im 10/66  lung]
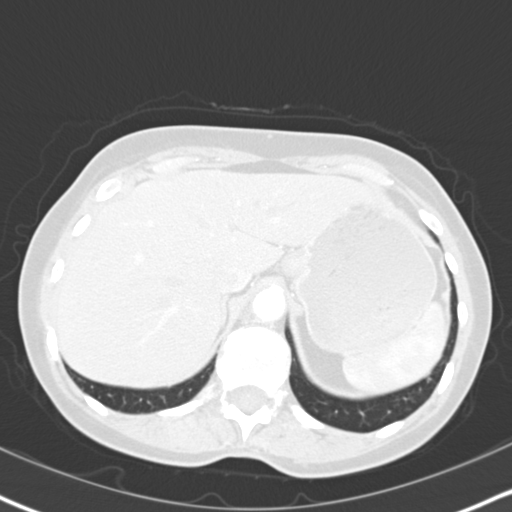
[im 15/66  lung]
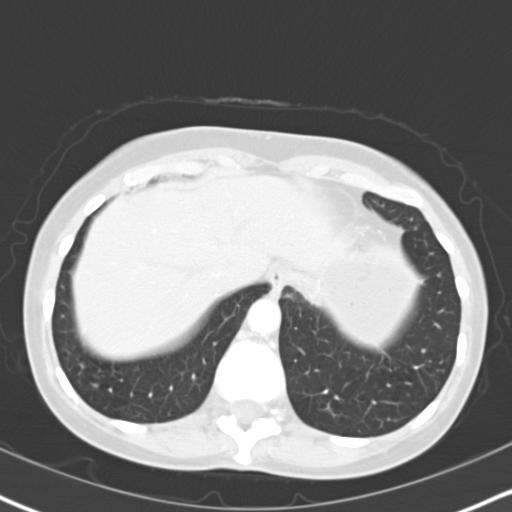
[im 20/66  lung]
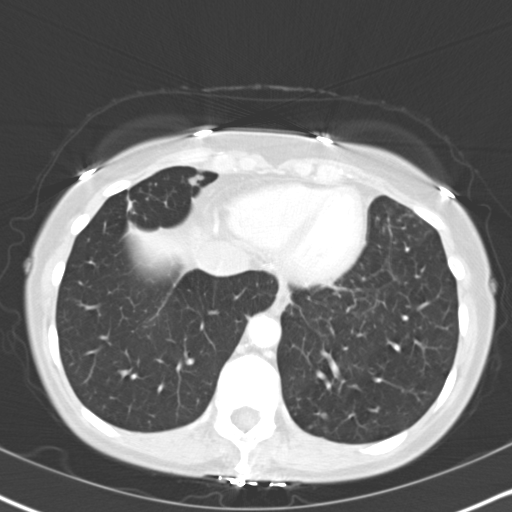
[im 25/66  mediastinal]
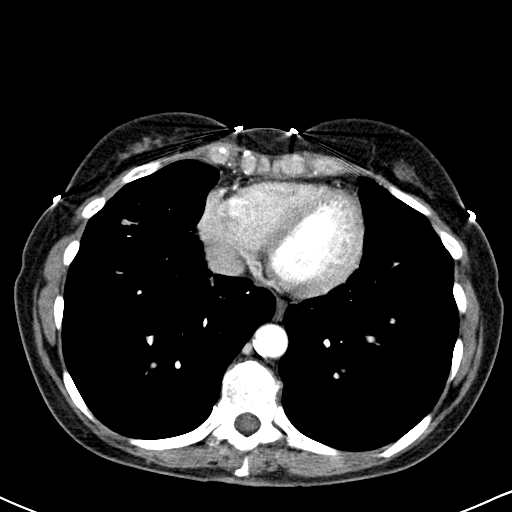
[im 25/66  lung]
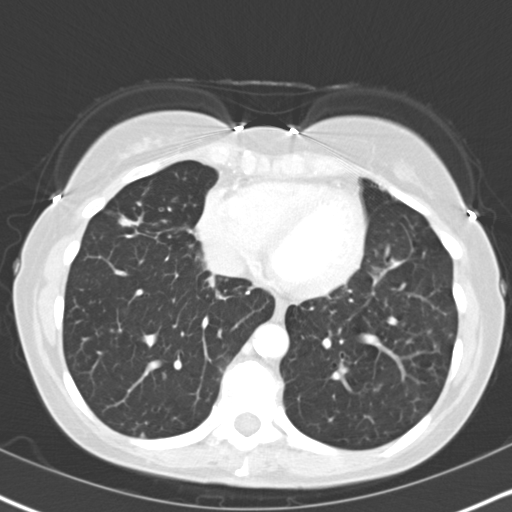
[im 29/66  lung]
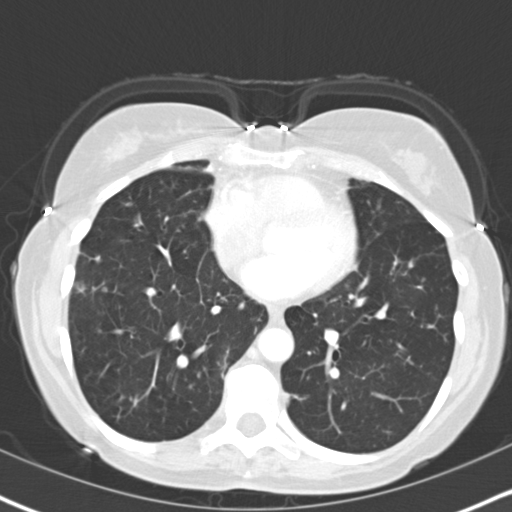
[im 37/66  lung]
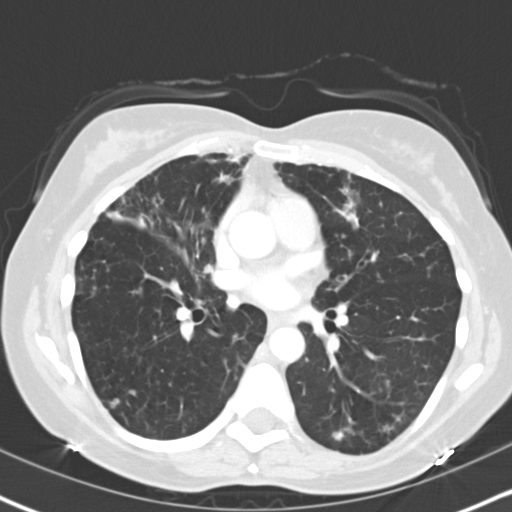
[im 41/66  lung]
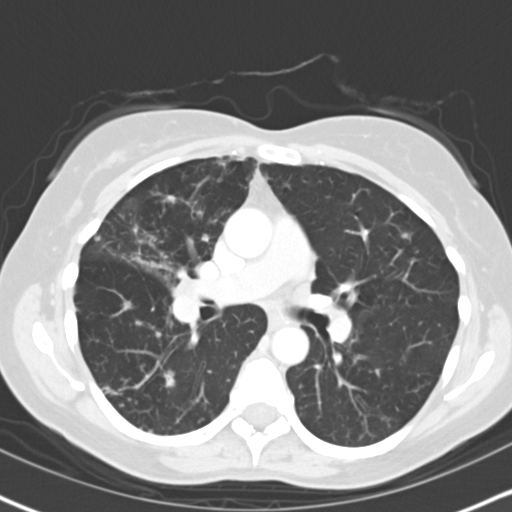
[im 46/66  mediastinal]
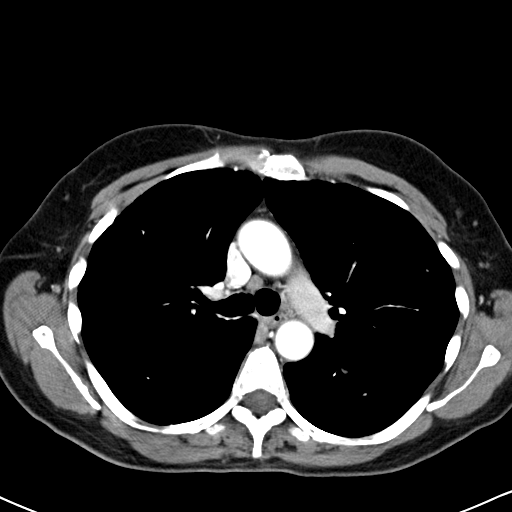
[im 46/66  lung]
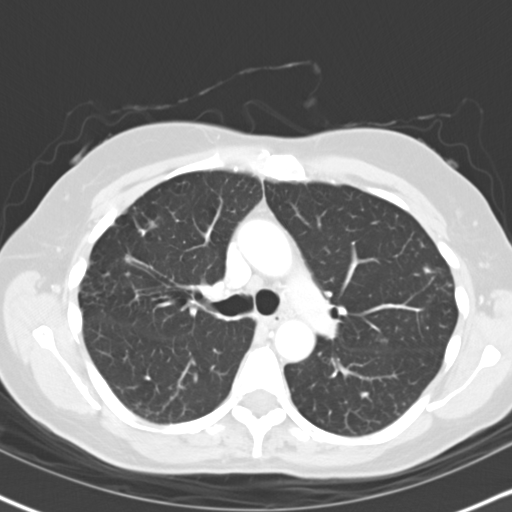
[im 51/66  lung]
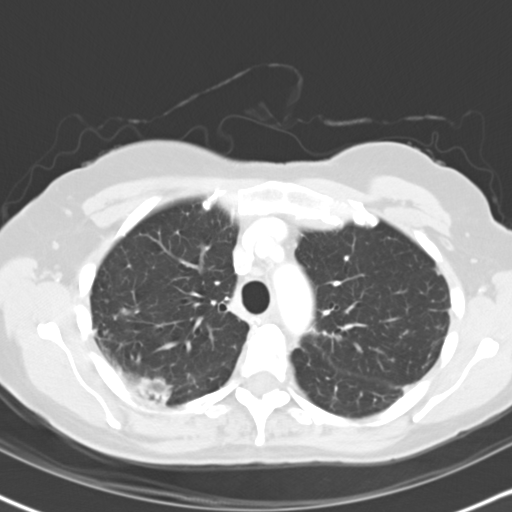
[im 56/66  lung]
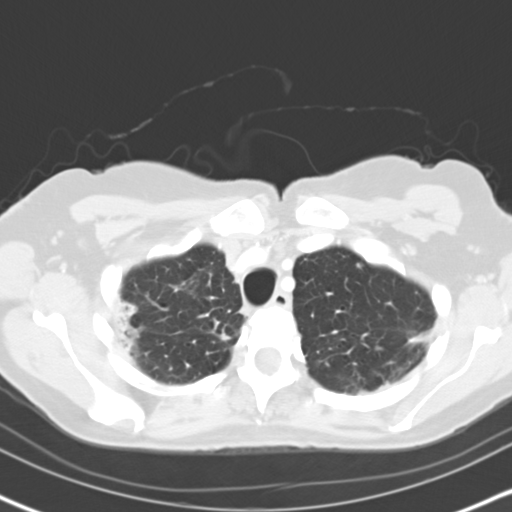
[im 61/66  lung]
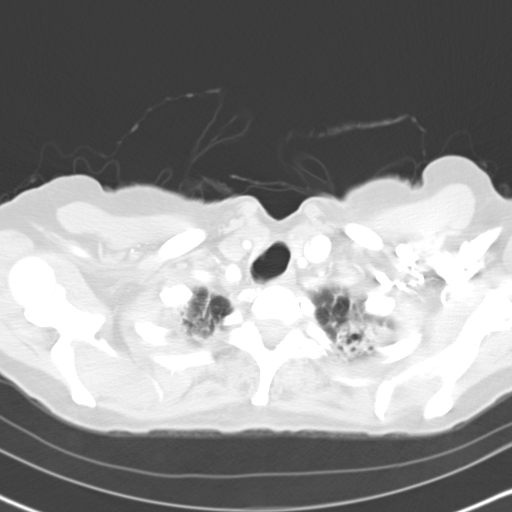

[Series 602: cor · coronal · 0.66mm/px · 3 of 96 slices shown]
[im 20/96  lung]
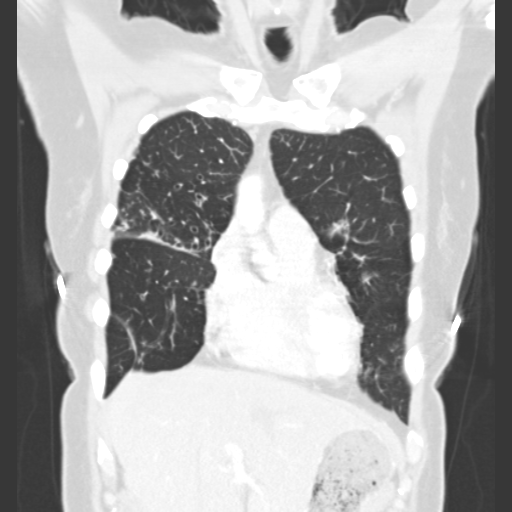
[im 39/96  lung]
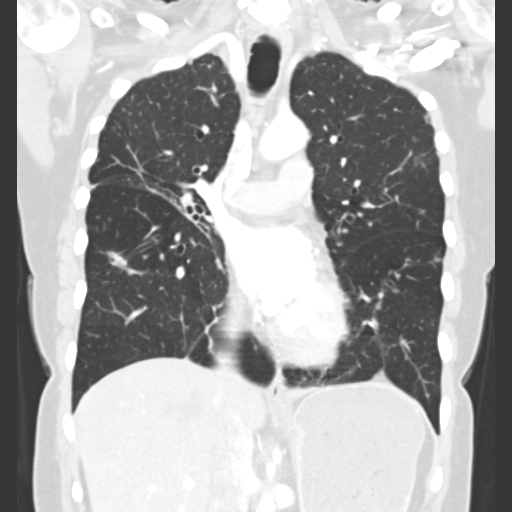
[im 58/96  lung]
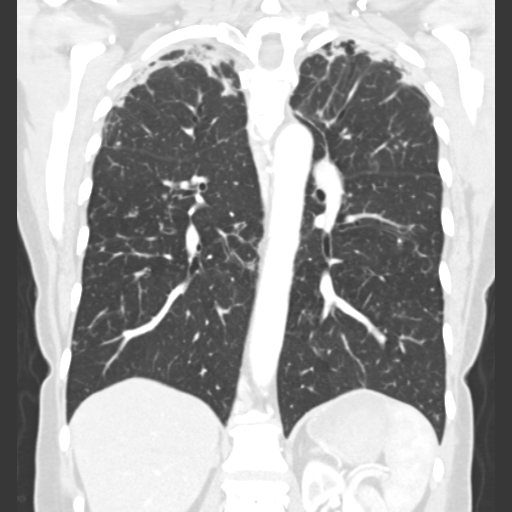

[15 of 36 positions shown; findings below may reference images not displayed]

FINDINGS: Mediastinal lymph nodes measure up to 9 mm in short axis
in the precarinal station.  No hilar or axillary adenopathy.  Air
in the main pulmonary artery is presumably iatrogenic.  Heart size
normal.  No pericardial effusion.

Biapical pleural parenchymal scarring.  There is diffuse
peribronchovascular nodularity, with mild architectural distortion
and scattered bronchiectasis.  Overall pattern appears somewhat
improved from [DATE].  There are some areas of slightly more
dominant nodularity bilaterally, some of which appear new (series
3, images 17 and 29).  No pleural fluid.  Airway is otherwise
unremarkable.

Incidental imaging of the upper abdomen shows no acute findings.
No worrisome lytic or sclerotic lesions.
IMPRESSION: Diffuse peribronchovascular nodularity, mild architectural
distortion and scattered bronchiectasis.  Overall pattern appears
somewhat improved from [DATE].  There are a few areas of
dominant nodularity bilaterally, some of which are new.
Collectively, findings are suggestive of mycobacterium avium
complex (NOBUHISA).  Follow-up could be performed in 3-6 months to
ensure continued stability or resolution, as clinically indicated.

## 2010-08-02 MED ORDER — IOHEXOL 300 MG/ML  SOLN
80.0000 mL | Freq: Once | INTRAMUSCULAR | Status: AC | PRN
  Administered 2010-08-02: 80 mL via INTRAVENOUS

## 2010-08-05 ENCOUNTER — Encounter: Payer: Self-pay | Admitting: Internal Medicine

## 2010-08-05 NOTE — Assessment & Plan Note (Signed)
This may relate to hx of pneumonia. We will watch for evidence of atypical AFB infection.

## 2010-08-15 NOTE — Progress Notes (Signed)
Quick Note:  Spoke with patient-she is aware of results and symptoms to watch for. Pt has PFT on 08-18-10 and ROV with CY on 08-30-2010 ______

## 2010-08-18 ENCOUNTER — Encounter: Payer: Self-pay | Admitting: Internal Medicine

## 2010-08-18 ENCOUNTER — Ambulatory Visit (INDEPENDENT_AMBULATORY_CARE_PROVIDER_SITE_OTHER): Payer: Medicare Other | Admitting: Internal Medicine

## 2010-08-18 DIAGNOSIS — J479 Bronchiectasis, uncomplicated: Secondary | ICD-10-CM

## 2010-08-18 LAB — PULMONARY FUNCTION TEST

## 2010-08-18 NOTE — Progress Notes (Signed)
PFT done today. 

## 2010-08-30 ENCOUNTER — Encounter: Payer: Self-pay | Admitting: Internal Medicine

## 2010-08-30 ENCOUNTER — Ambulatory Visit (INDEPENDENT_AMBULATORY_CARE_PROVIDER_SITE_OTHER): Payer: Medicare Other | Admitting: Internal Medicine

## 2010-08-30 DIAGNOSIS — J479 Bronchiectasis, uncomplicated: Secondary | ICD-10-CM

## 2010-08-30 DIAGNOSIS — J984 Other disorders of lung: Secondary | ICD-10-CM

## 2010-08-30 DIAGNOSIS — R042 Hemoptysis: Secondary | ICD-10-CM

## 2010-08-30 NOTE — Patient Instructions (Signed)
We will see you again in 6 months, anticipating we will schedule a Chest CT w/o contrast then. Please let me know if you have concerns sooner.

## 2010-08-30 NOTE — Progress Notes (Signed)
Subjective:    Patient ID: [name], female    DOB: 07-Nov-1940, 70 y.o.   MRN: [id number]  HPI    Review of Systems     Objective:   Physical Exam        Assessment & Plan:   Subjective:    Patient ID: [name], female    DOB: 01/21/1941, 70 y.o.   MRN: [id number]  HPI 08/01/10- 39 yo F never smoker with hx bronchitis and chronic recurrent hemoptysis. Referred courtesy of Dr Mila Palmer. Has noted blood from mouth, mostly at night after she has been asleep, intermittently since at least 2003. Previous ENT eval by Dr Gerilyn Pilgrim, Bronchoscopy by Dr Tanda Rockers as well as pulmonary evaluation by Dr Shelle Iron. No source found. She put herself on aspirin as a self-imposed health measure in April and noted onset of bleeding again about a month after that. Self-limited then and no recurrence since.. Denies fever, sweat, nodes, weight loss or chest pain.  CT chest 08/20/03 - advanced COPD with bronchiectasis and blebs.  Lived in [place] for 5 months in 2005- bleeding preceded that trip. Remote pneumonia. PPD negative.  08/30/10- 58 yo F never smoker with hx bronchitis and chronic recurrent hemoptysis. CT 08/01/10- suggests MAIC with scattered nodularity and bronchiectasis. Some improvement since 12/09/07, but some new areas. Images reviewed with her.  She denies any change. Coughs a little, intermittently on first rising, scant phlegm. Denies fever or night sweats. Weight fluctuates in her range.  No heme since last here.   Review of Systems Constitutional:   No weight loss, night sweats,  Fevers, chills, fatigue, lassitude. HEENT:   No headaches,  Difficulty swallowing,  Tooth/dental problems,  Sore throat,                No sneezing, itching, ear ache, nasal congestion, post nasal drip,   CV:  No chest pain,  Orthopnea, PND, swelling in lower extremities, anasarca, dizziness, palpitations  GI  No heartburn, indigestion, abdominal pain, nausea, vomiting, diarrhea, change in  bowel habits, loss of appetite  Resp: No shortness of breath with exertion or at rest.  No excess mucus,No change in color of mucus.  No wheezing.  Skin: no rash or lesions.  GU: no dysuria, change in color of urine, no urgency or frequency.  No flank pain.  MS:  No joint pain or swelling.  No decreased range of motion.  No back pain.  Psych:  No change in mood or affect. No depression or anxiety.  No memory loss.      Objective:   Physical Exam General- Alert, Oriented, Affect-appropriate, Distress- none acute  Skin- rash-none, lesions- none, excoriation- none  Lymphadenopathy- none  Head- atraumatic  Eyes- Gross vision intact, PERRLA, conjunctivae clear secretions  Ears- Hearing, canals, Tm- normal  Nose- Clear, No-Septal dev, mucus, polyps, erosion, perforation   Throat- Mallampati II , mucosa clear , drainage- none, tonsils- atrophic  Neck- flexible , trachea midline, no stridor , thyroid nl, carotid no bruit  Chest - symmetrical excursion , unlabored     Heart/CV- RRR , no murmur , no gallop  , no rub, nl s1 s2                     - JVD- none , edema- none, stasis changes- none, varices- none     Lung- trace crackle right scapula, wheeze- none, cough- none , dullness-none, rub- none     Chest wall-  Abd- tender-no, distended-no,  bowel sounds-present, HSM- no  Br/ Gen/ Rectal- Not done, not indicated  Extrem- cyanosis- none, clubbing, none, atrophy- none, strength- nl  Neuro- grossly intact to observation         Assessment & Plan:

## 2010-08-30 NOTE — Assessment & Plan Note (Addendum)
Significant scarring and bronchiectasis are the likely explanations for bleeding. She may need occasional antibiotic. She is not expressing symptoms of active MAIC now, but we will watch for change. There are more nodular areas that will also be compared over time.

## 2010-09-02 NOTE — Assessment & Plan Note (Addendum)
Probable hemorrhagic bronchitis related to the changes seen on CT. Not bleeding now.

## 2010-09-02 NOTE — Assessment & Plan Note (Signed)
She has no sputum and no night sweats or fever. Rather than suggest bronchoscopy now for BAL, we will follow radiologically.

## 2010-11-20 LAB — CBC
MCHC: 33.5
Platelets: 209
RBC: 4.45
WBC: 17.1 — ABNORMAL HIGH

## 2010-11-20 LAB — DIFFERENTIAL
Basophils Relative: 0
Monocytes Relative: 4
Neutro Abs: 15.8 — ABNORMAL HIGH
Neutrophils Relative %: 92 — ABNORMAL HIGH

## 2010-11-20 LAB — POCT I-STAT, CHEM 8
Chloride: 100
Glucose, Bld: 144 — ABNORMAL HIGH
HCT: 43
Hemoglobin: 14.6
Potassium: 3.6
Sodium: 134 — ABNORMAL LOW

## 2010-11-20 LAB — URINE MICROSCOPIC-ADD ON

## 2010-11-20 LAB — URINALYSIS, ROUTINE W REFLEX MICROSCOPIC
Bilirubin Urine: NEGATIVE
Leukocytes, UA: NEGATIVE
Nitrite: NEGATIVE
Specific Gravity, Urine: 1.02
Urobilinogen, UA: 0.2
pH: 7

## 2011-02-25 ENCOUNTER — Emergency Department (INDEPENDENT_AMBULATORY_CARE_PROVIDER_SITE_OTHER): Payer: Medicare Other

## 2011-02-25 ENCOUNTER — Encounter (HOSPITAL_COMMUNITY): Payer: Self-pay | Admitting: Cardiology

## 2011-02-25 ENCOUNTER — Emergency Department (HOSPITAL_COMMUNITY)
Admission: EM | Admit: 2011-02-25 | Discharge: 2011-02-25 | Disposition: A | Discharge status: Home / Self Care | Payer: Medicare Other | Source: Home / Self Care | Attending: Emergency Medicine | Admitting: Emergency Medicine

## 2011-02-25 DIAGNOSIS — J209 Acute bronchitis, unspecified: Secondary | ICD-10-CM

## 2011-02-25 DIAGNOSIS — J479 Bronchiectasis, uncomplicated: Secondary | ICD-10-CM

## 2011-02-25 DIAGNOSIS — R059 Cough, unspecified: Secondary | ICD-10-CM

## 2011-02-25 DIAGNOSIS — J440 Chronic obstructive pulmonary disease with acute lower respiratory infection: Secondary | ICD-10-CM

## 2011-02-25 DIAGNOSIS — R05 Cough: Secondary | ICD-10-CM

## 2011-02-25 DIAGNOSIS — J208 Acute bronchitis due to other specified organisms: Secondary | ICD-10-CM

## 2011-02-25 HISTORY — DX: Chronic sinusitis, unspecified: J32.9

## 2011-02-25 HISTORY — DX: Chronic obstructive pulmonary disease, unspecified: J44.9

## 2011-02-25 HISTORY — DX: Dizziness and giddiness: R42

## 2011-02-25 IMAGING — CR DG CHEST 2V
2 series · 2 of 2 positions shown · non-contrast
Comparison: CT chest dated [DATE]

CLINICAL DATA: Cough, fever

CHEST - 2 VIEW

[view not recorded (1 of 2)]
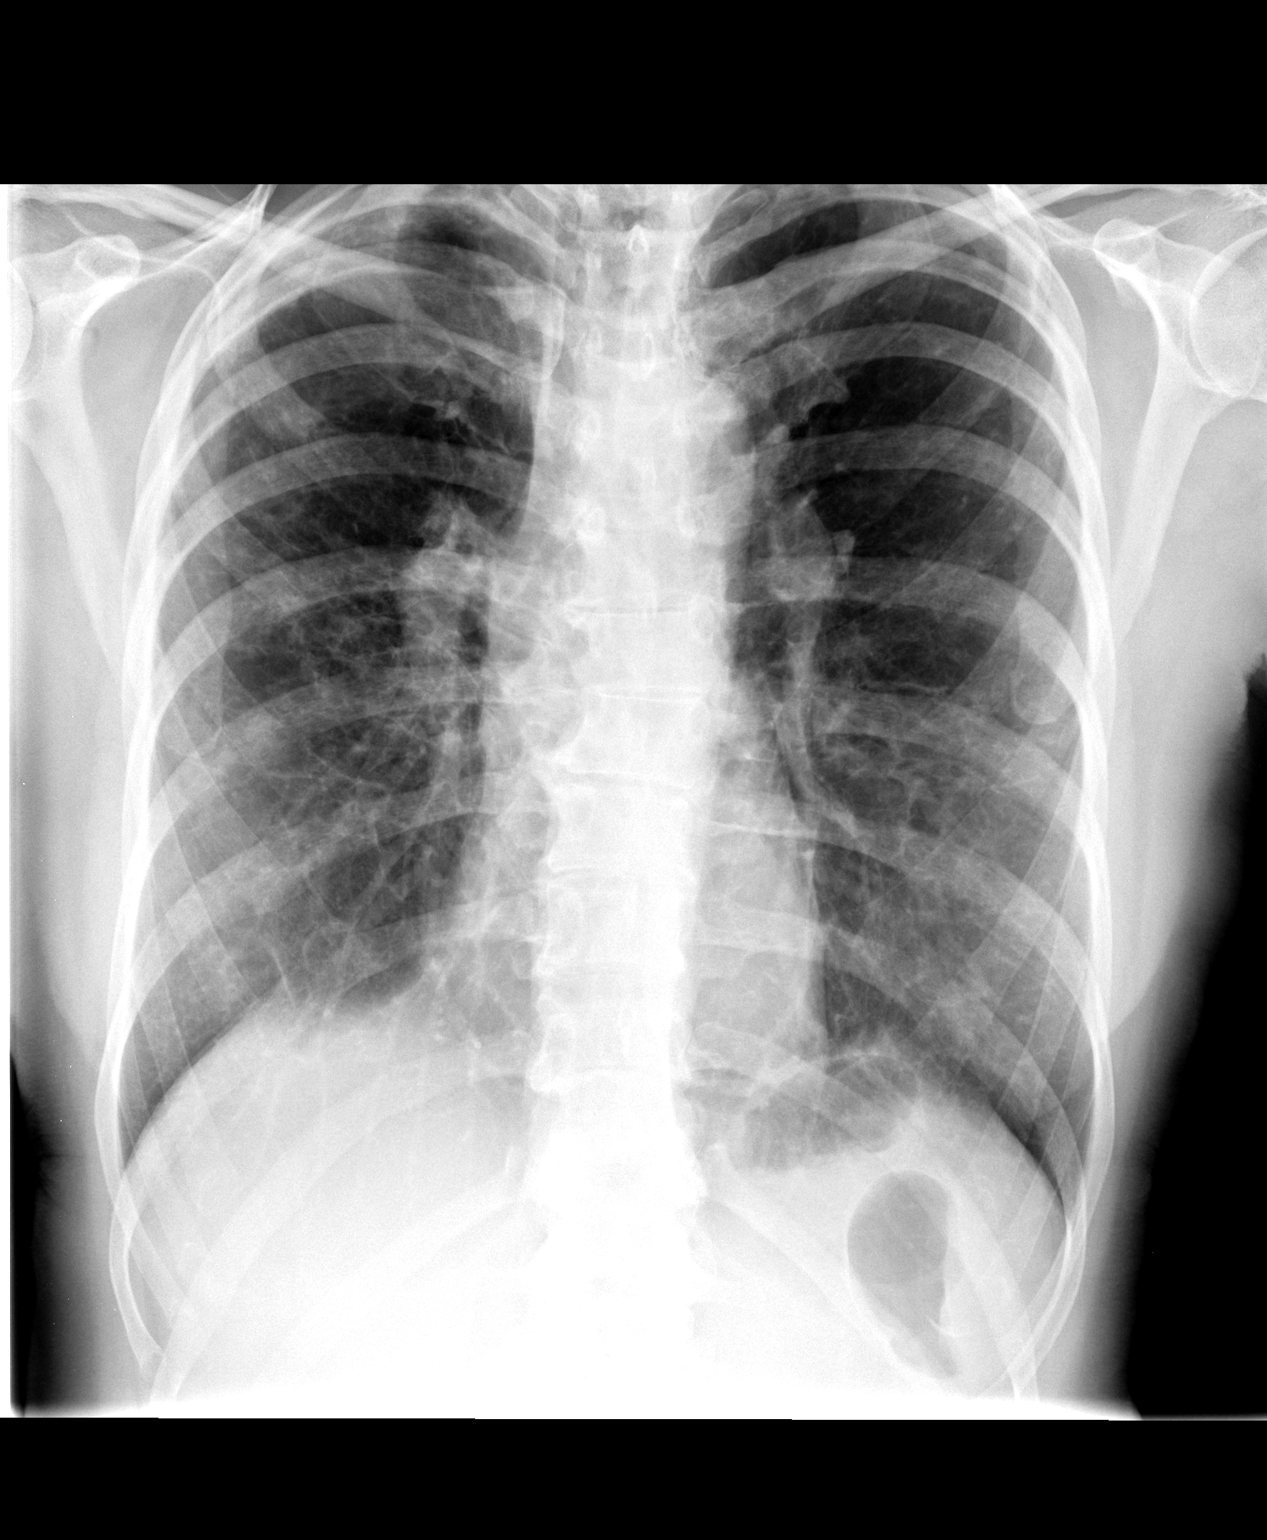

[view not recorded (2 of 2)]
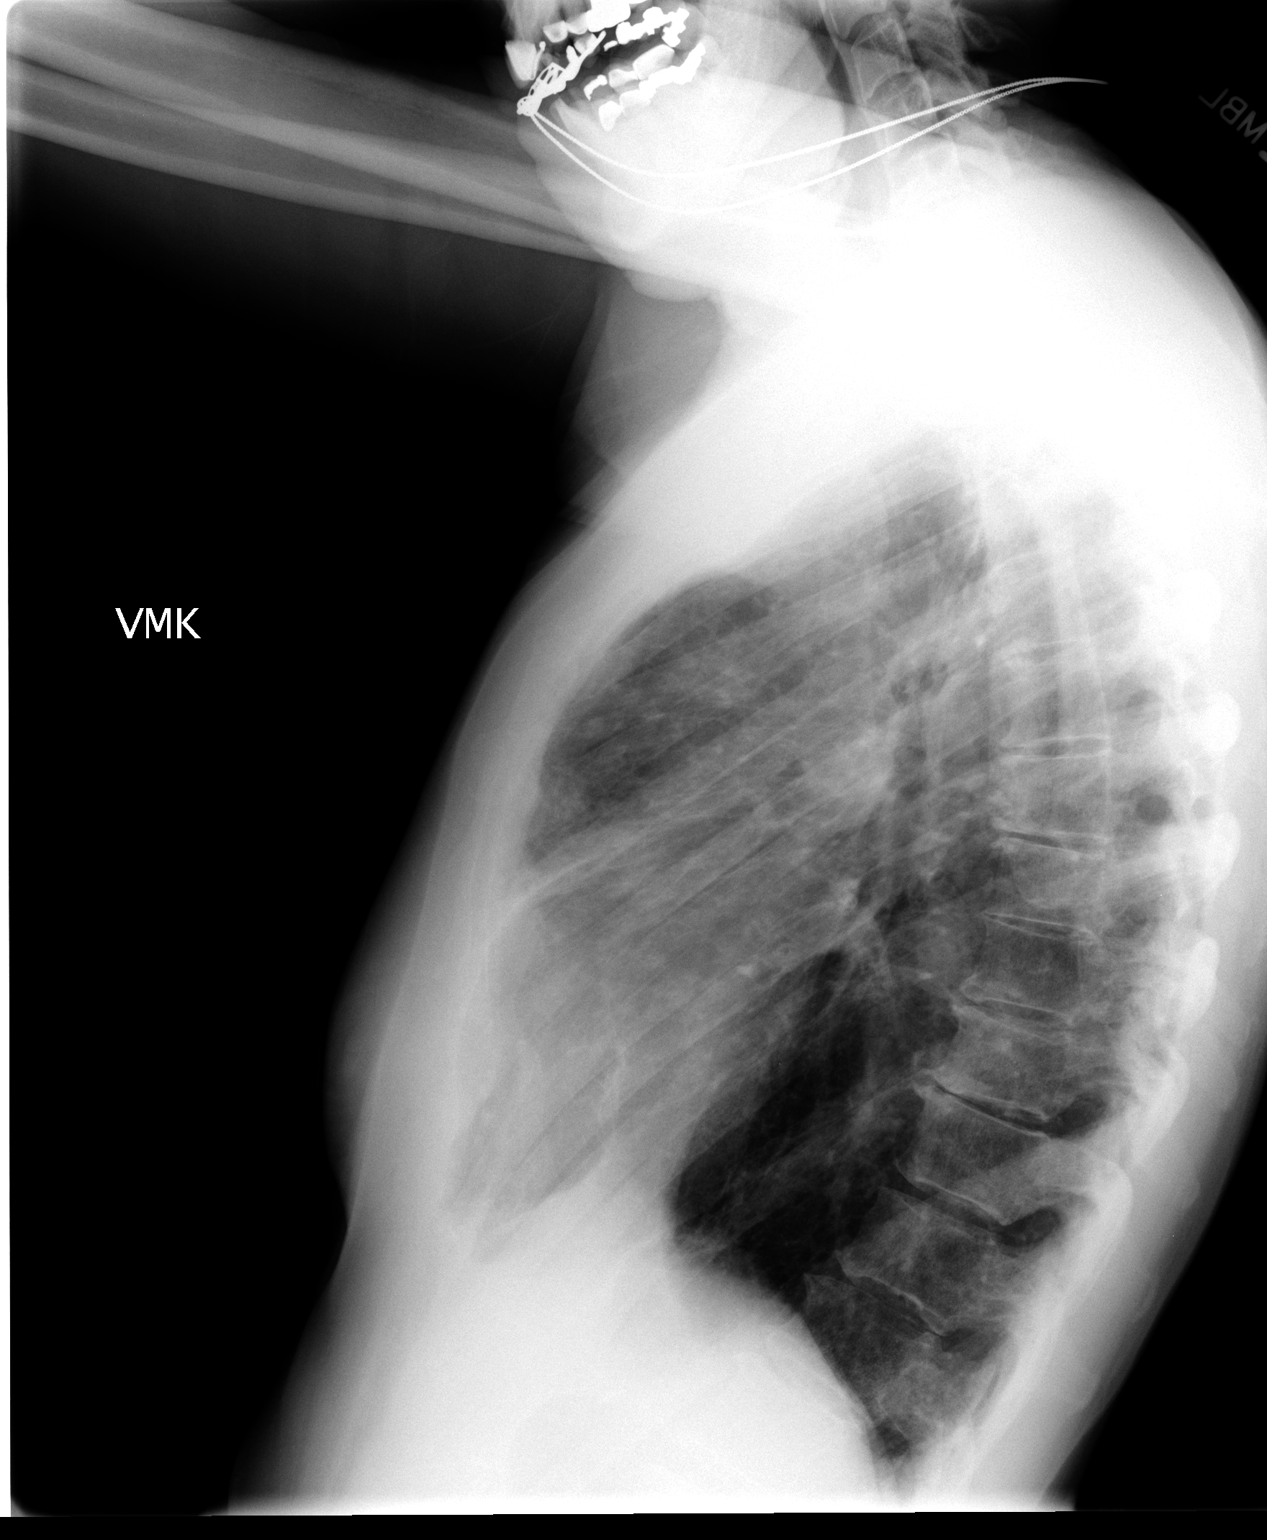

[2 of 2 positions shown; findings below may reference images not displayed]

FINDINGS: Chronic interstitial markings.  No superimposed opacities
suspicious for pneumonia.  No pleural effusion or pneumothorax.

Cardiomediastinal silhouette is within normal limits.

Degenerative changes of the visualized thoracolumbar spine.
IMPRESSION: Chronic interstitial markings.

No superimposed opacities suspicious for pneumonia.

## 2011-02-25 MED ORDER — ALBUTEROL SULFATE HFA 108 (90 BASE) MCG/ACT IN AERS: 1.0000 | INHALATION_SPRAY | Freq: Four times a day (QID) | RESPIRATORY_TRACT | Status: DC | PRN

## 2011-02-25 MED ORDER — ALBUTEROL SULFATE (5 MG/ML) 0.5% IN NEBU
5.0000 mg | INHALATION_SOLUTION | Freq: Once | RESPIRATORY_TRACT | Status: AC
  Administered 2011-02-25: 5 mg via RESPIRATORY_TRACT

## 2011-02-25 MED ORDER — ALBUTEROL SULFATE (5 MG/ML) 0.5% IN NEBU
INHALATION_SOLUTION | RESPIRATORY_TRACT | Status: AC
  Filled 2011-02-25: qty 1

## 2011-02-25 MED ORDER — HYDROCODONE-ACETAMINOPHEN 7.5-500 MG/15ML PO SOLN: 5.0000 mL | Freq: Four times a day (QID) | ORAL | Status: AC | PRN

## 2011-02-25 MED ORDER — DEXAMETHASONE 4 MG PO TABS: ORAL_TABLET | ORAL | Status: AC

## 2011-02-25 MED ORDER — IPRATROPIUM BROMIDE 0.02 % IN SOLN
0.5000 mg | Freq: Once | RESPIRATORY_TRACT | Status: AC
  Administered 2011-02-25: 0.5 mg via RESPIRATORY_TRACT

## 2011-02-25 NOTE — ED Notes (Signed)
Pt reports cough that is productive in nature that started Friday. Body aches all over. Fever noted here at Urgent Care. Pt has tried alkaseltzer plus for one week with relief until this pat Friday. Poor PO intake today.

## 2011-02-25 NOTE — ED Provider Notes (Signed)
History     CSN: [id number]  Arrival date & time 02/25/11  1330   First MD Initiated Contact with Patient 02/25/11 1338      Chief Complaint  Patient presents with  . Cough  . Fever    (Consider location/radiation/quality/duration/timing/severity/associated sxs/prior treatment) HPI Comments: Pt with rhinorrhea, postnasal drip, ST, cough productive of yellowish sputum, fatigue, bodyaches x 2 days. States feels chills and "hot" but no measured fevers at home. Unable to sleep at night secondary to coughing. Achy chest soreness after coughing. postussive emesis x 1. No other N/V.  No ear pain, wheeze, SOB, abd pain, rash. Decreased appetite but is tolerating po.  Did not get flu shot this year. H/o COPD. No beta agonists or inhaled steriods at home. States has appt with pulmonologist Dr. Maple Hudson later this week.    Review of Systems: Positive for fatigue, mild nasal congestion, mild sore throat, mild swollen anterior neck glands, mild cough. Negative for acute vision changes, stiff neck, focal weakness, syncope, seizures, respiratory distress, vomiting, diarrhea, GU symptoms.   Patient is a 71 y.o. female presenting with cough and fever. The history is provided by the patient.  Cough This is a new problem. The current episode started more than 2 days ago. The problem occurs constantly. The problem has been gradually worsening. The cough is productive of sputum. There has been no fever. Pertinent negatives include no ear congestion. Treatments tried: alka seltzer cold. The treatment provided no relief.  Fever    Past Medical History  Diagnosis Date  . Depression   . Restless leg syndrome   . Hypothyroidism   . Anemia   . Hemoptysis   . Vertigo   . COPD (chronic obstructive pulmonary disease)   . Sinusitis     Past Surgical History  Procedure Date  . Vesicovaginal fistula closure w/ tah 1992  . Appendectomy 2009  . Abdominal hysterectomy     Family History  Problem Relation  Age of Onset  . Emphysema Mother     smoker  . Emphysema Father     smoker  . Heart disease Father   . Heart disease Mother     History  Substance Use Topics  . Smoking status: Never Smoker   . Smokeless tobacco: Not on file  . Alcohol Use: No    OB History    Grav Para Term Preterm Abortions TAB SAB Ect Mult Living                  Review of Systems  Allergies  Black cohosh and Codeine  Home Medications   Current Outpatient Rx  Name Route Sig Dispense Refill  . ALPRAZOLAM 0.5 MG PO TABS Oral Take 0.5 mg by mouth at bedtime as needed.      Marland Kitchen CALCIUM & MAGNESIUM CARBONATES 311-232 MG PO TABS Oral Take 1 tablet by mouth daily.      Marland Kitchen CALCIUM CARBONATE 600 MG PO TABS Oral Take 1,800 mg by mouth daily.      . CHOLECALCIFEROL 1000 UNITS PO TABS Oral Take 1,000 Units by mouth daily.      Marland Kitchen CITALOPRAM HYDROBROMIDE 20 MG PO TABS Oral Take 20 mg by mouth daily.      Marland Kitchen ESTRADIOL 0.5 MG PO TABS Oral Take 0.5 mg by mouth daily.      Marland Kitchen FERROUS GLUCONATE IRON 246 (28 FE) MG PO TABS Oral Take 246 mg by mouth daily with breakfast.      . IBUPROFEN 200  MG PO TABS Oral Take 400 mg by mouth at bedtime.      Marland Kitchen LEVOTHYROXINE SODIUM 88 MCG PO TABS Oral Take 88 mcg by mouth daily.      Marland Kitchen ONE-DAILY MULTI VITAMINS PO TABS Oral Take 1 tablet by mouth daily.      Marland Kitchen FISH OIL 1000 MG PO CAPS Oral Take 1 capsule by mouth daily.      Marland Kitchen VITAMIN C 500 MG PO TABS Oral Take 500 mg by mouth daily.      . ALBUTEROL SULFATE HFA 108 (90 BASE) MCG/ACT IN AERS Inhalation Inhale 1-2 puffs into the lungs every 6 (six) hours as needed for wheezing. 1 Inhaler 0  . DEXAMETHASONE 4 MG PO TABS  4 tabs po at once on day one, 4 tabs po at once on day 2 8 tablet 0  . HYDROCODONE-ACETAMINOPHEN 7.5-500 MG/15ML PO SOLN Oral Take 5 mLs by mouth every 6 (six) hours as needed for pain. 120 mL 0  . MECLIZINE HCL 25 MG PO TABS Oral Take 25 mg by mouth daily as needed.        BP 129/76  Pulse 114  Temp(Src) 99.9 F (37.7 C)  (Oral)  Resp 22  SpO2 95%  Physical Exam  Nursing note and vitals reviewed. Constitutional: She is oriented to person, place, and time. She appears well-developed and well-nourished.       Coughing. Appears tired  HENT:  Head: Normocephalic and atraumatic.  Right Ear: Tympanic membrane normal.  Left Ear: Tympanic membrane normal.  Nose: Mucosal edema and rhinorrhea present. Right sinus exhibits no maxillary sinus tenderness and no frontal sinus tenderness. Left sinus exhibits no maxillary sinus tenderness.  Mouth/Throat: Uvula is midline and mucous membranes are normal. Posterior oropharyngeal erythema present. No oropharyngeal exudate, posterior oropharyngeal edema or tonsillar abscesses.  Eyes: Conjunctivae and EOM are normal. Pupils are equal, round, and reactive to light.  Neck: Normal range of motion.  Cardiovascular: Normal rate, regular rhythm, normal heart sounds and intact distal pulses.   No murmur heard. Pulmonary/Chest: Effort normal. No respiratory distress. She has wheezes. She has rales. She exhibits no tenderness.       Rales at bases b/l.  Abdominal: Soft. Bowel sounds are normal. She exhibits no distension. There is no tenderness.  Musculoskeletal: Normal range of motion. She exhibits no edema and no tenderness.  Neurological: She is alert and oriented to person, place, and time.  Skin: Skin is warm and dry.  Psychiatric: She has a normal mood and affect. Her behavior is normal. Judgment and thought content normal.    ED Course  Procedures (including critical care time)  Labs Reviewed - No data to display Dg Chest 2 View  02/25/2011  *RADIOLOGY REPORT*  Clinical Data: Cough, fever  CHEST - 2 VIEW  Comparison: CT chest dated 08/02/2010  Findings: Chronic interstitial markings.  No superimposed opacities suspicious for pneumonia.  No pleural effusion or pneumothorax.  Cardiomediastinal silhouette is within normal limits.  Degenerative changes of the visualized  thoracolumbar spine.  IMPRESSION: Chronic interstitial markings.  No superimposed opacities suspicious for pneumonia.  Original Report Authenticated By: Charline Bills, M.D.     1. Viral bronchitis   2. COPD with acute bronchitis       MDM  Pt seen and examined. AF here. Pt speaking in full sentences but has wheezing upper lung fields and rales at bases, Pt given albuterol/atrovent. Will do CXR because of h/o COPD, tachycardia, O2 sat 95%. Will re-evaluate.  Imaging reviewed by myself. Report per radiologist.   On re-evaluation, pt feels much improved after medication, pt comfortable, VSS. Improved air movement, faint wheezing on left side on repeat physical exam.   Discussed imaging with patient / family. Emphasized importance of f/u. Pt  agrees.     Luiz Blare, MD 02/25/11 [phone]

## 2011-02-27 ENCOUNTER — Encounter: Payer: Self-pay | Admitting: *Deleted

## 2011-02-27 ENCOUNTER — Emergency Department
Admit: 2011-02-27 | Discharge: 2011-02-27 | Disposition: A | Discharge status: Home / Self Care | Payer: Medicare Other | Attending: Emergency Medicine | Admitting: Emergency Medicine

## 2011-02-27 ENCOUNTER — Emergency Department
Admission: EM | Admit: 2011-02-27 | Discharge: 2011-02-27 | Disposition: A | Discharge status: Home / Self Care | Payer: Medicare Other | Source: Home / Self Care | Attending: Emergency Medicine | Admitting: Emergency Medicine

## 2011-02-27 DIAGNOSIS — R059 Cough, unspecified: Secondary | ICD-10-CM

## 2011-02-27 DIAGNOSIS — J189 Pneumonia, unspecified organism: Secondary | ICD-10-CM

## 2011-02-27 DIAGNOSIS — R05 Cough: Secondary | ICD-10-CM

## 2011-02-27 IMAGING — CR DG CHEST 2V
2 series · 2 of 2 positions shown · non-contrast
Comparison: [DATE]

CLINICAL DATA: Cough, shortness of breath

CHEST - 2 VIEW

[view not recorded (1 of 2)]
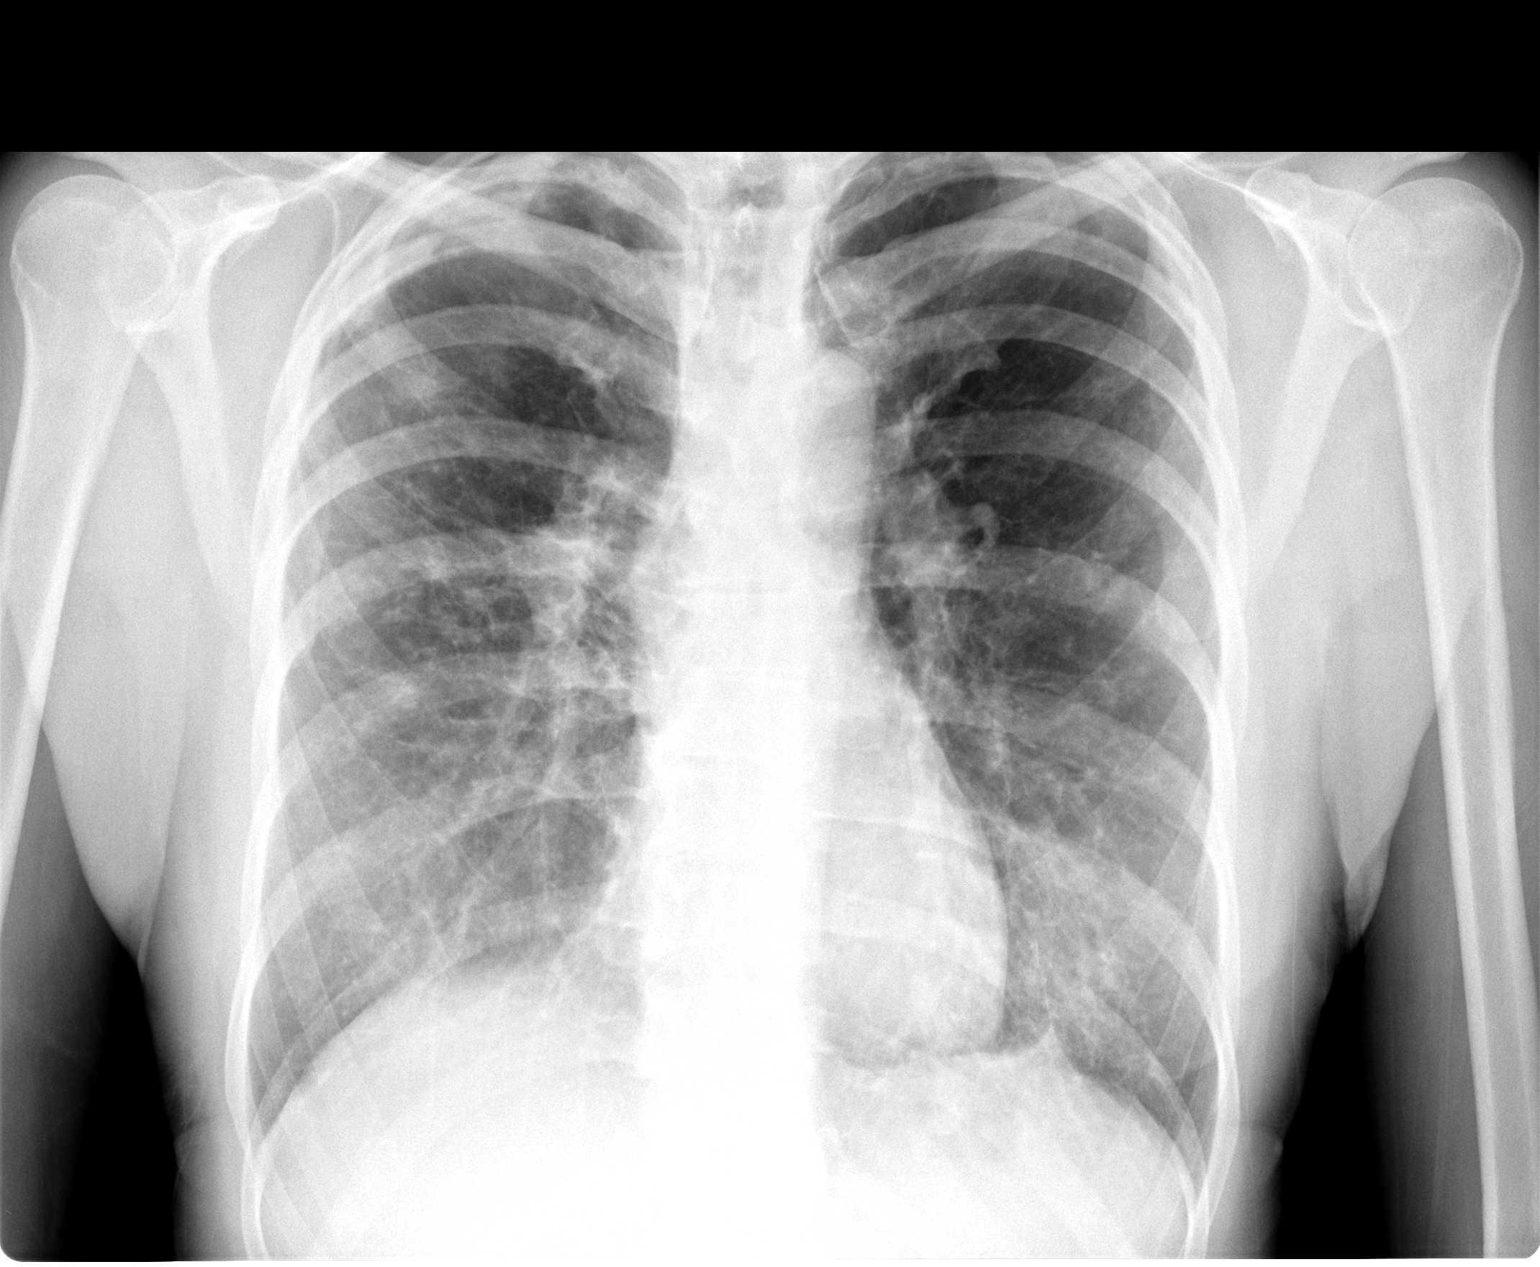

[view not recorded (2 of 2)]
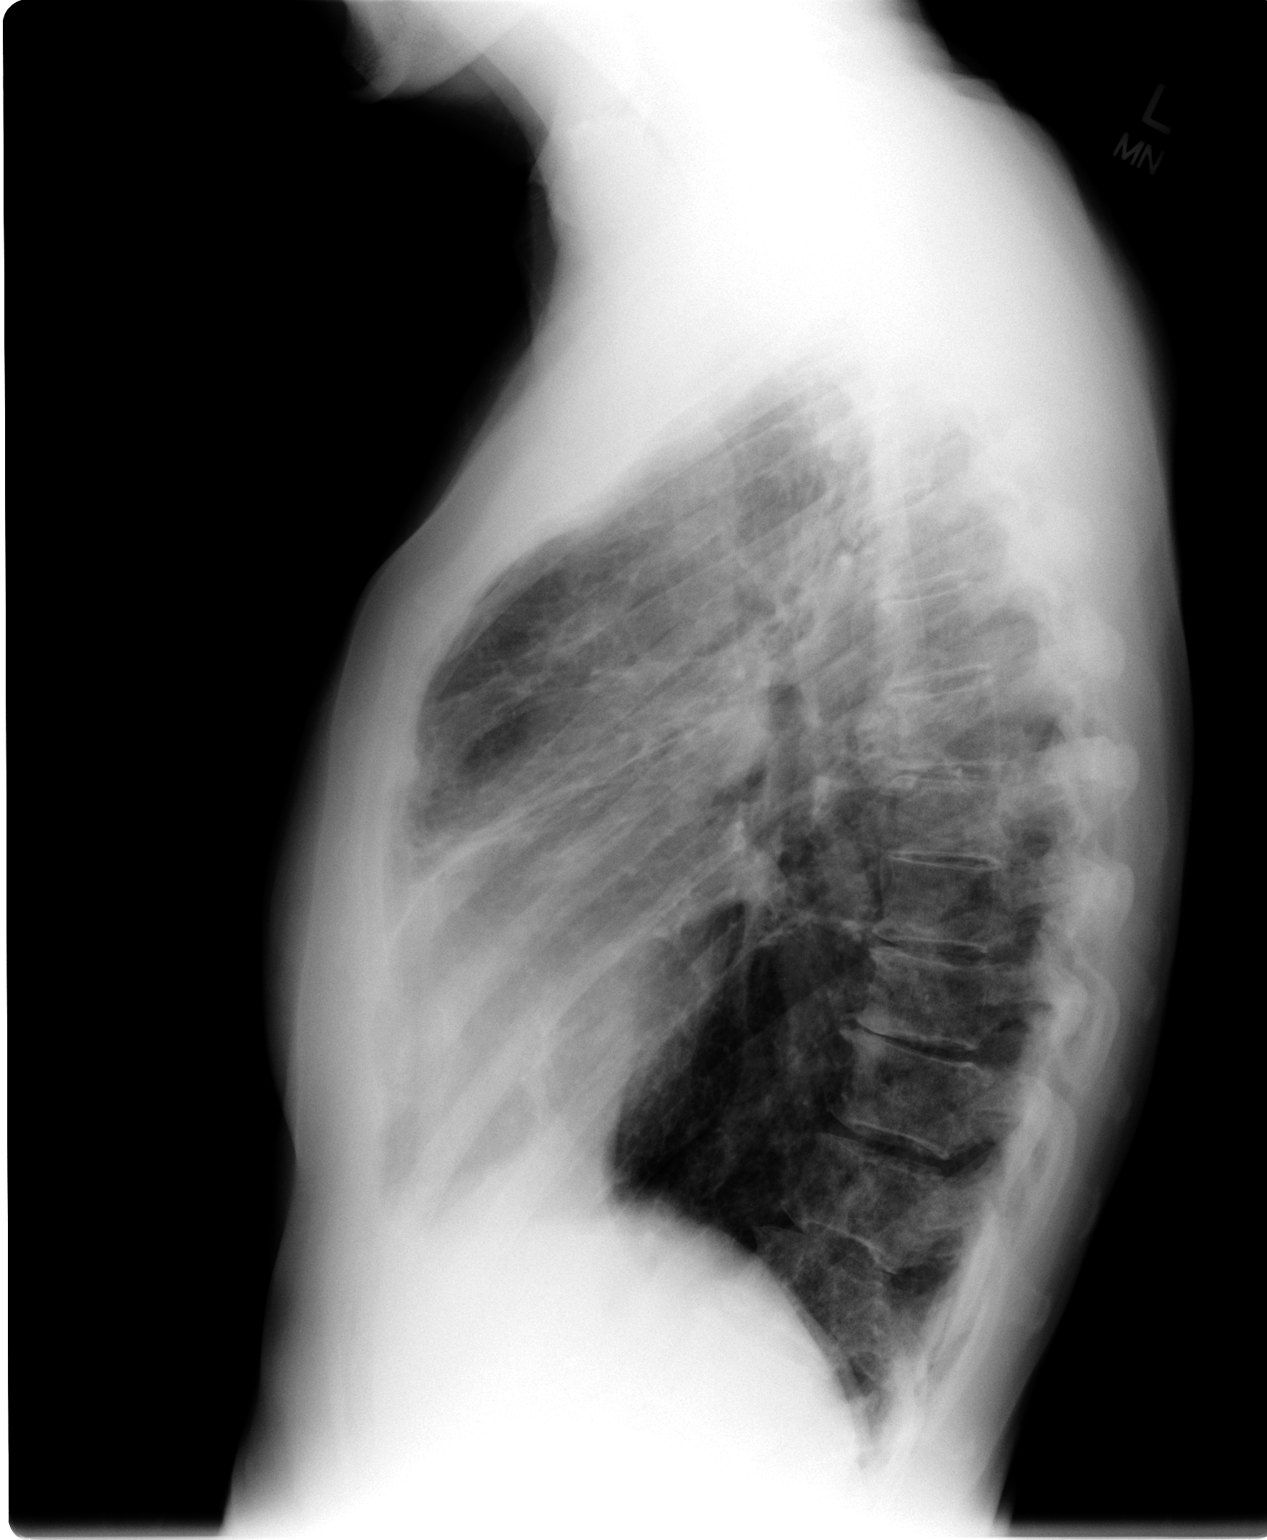

[2 of 2 positions shown; findings below may reference images not displayed]

FINDINGS: Chronic interstitial markings.  Increased left lower lobe
opacity, suspicious for pneumonia.  Right basilar atelectasis /
scarring. No pleural effusion or pneumothorax.

Cardiomediastinal silhouette is within normal limits.

Degenerative changes of the visualized thoracolumbar spine.
IMPRESSION: Increasing left lower lobe opacity, suspicious for pneumonia.

Underlying chronic interstitial markings.

## 2011-02-27 MED ORDER — LEVOFLOXACIN 500 MG PO TABS: 750.0000 mg | ORAL_TABLET | Freq: Every day | ORAL | Status: AC

## 2011-02-27 MED ORDER — CEFTRIAXONE SODIUM 1 G IJ SOLR
1.0000 g | Freq: Once | INTRAMUSCULAR | Status: AC
  Administered 2011-02-27: 1 g via INTRAMUSCULAR

## 2011-02-27 NOTE — ED Provider Notes (Signed)
History     CSN: [id number]  Arrival date & time 02/27/11  1211   First MD Initiated Contact with Patient 02/27/11 1252      Chief Complaint  Patient presents with  . Cough    (Consider location/radiation/quality/duration/timing/severity/associated sxs/prior treatment) HPI She went to [facility] ago with congestion, cough, and upper respiratory symptoms. They did a chest x-ray that was read as no pneumonia. She was given an inhaler, cough medicine, and sent home. She states that since then she is beginning progressively worse. The cough and shortness of breath has increased. She's been using her medicine as directed. Please refer to the previous note for details of that visit.  She did not receive a flu shot this year.  Past Medical History  Diagnosis Date  . Depression   . Restless leg syndrome   . Hypothyroidism   . Anemia   . Hemoptysis   . Vertigo   . COPD (chronic obstructive pulmonary disease)   . Sinusitis     Past Surgical History  Procedure Date  . Vesicovaginal fistula closure w/ tah 1992  . Appendectomy 2009  . Abdominal hysterectomy     Family History  Problem Relation Age of Onset  . Emphysema Mother     smoker  . Heart disease Mother   . COPD Mother   . Emphysema Father     smoker  . Heart disease Father     History  Substance Use Topics  . Smoking status: Never Smoker   . Smokeless tobacco: Not on file  . Alcohol Use: No    OB History    Grav Para Term Preterm Abortions TAB SAB Ect Mult Living                  Review of Systems  Allergies  Black cohosh and Codeine  Home Medications   Current Outpatient Rx  Name Route Sig Dispense Refill  . ALBUTEROL SULFATE HFA 108 (90 BASE) MCG/ACT IN AERS Inhalation Inhale 1-2 puffs into the lungs every 6 (six) hours as needed for wheezing. 1 Inhaler 0  . ALPRAZOLAM 0.5 MG PO TABS Oral Take 0.5 mg by mouth at bedtime as needed.      Marland Kitchen CALCIUM & MAGNESIUM CARBONATES 311-232  MG PO TABS Oral Take 1 tablet by mouth daily.      Marland Kitchen CALCIUM CARBONATE 600 MG PO TABS Oral Take 1,800 mg by mouth daily.      . CHOLECALCIFEROL 1000 UNITS PO TABS Oral Take 1,000 Units by mouth daily.      Marland Kitchen CITALOPRAM HYDROBROMIDE 20 MG PO TABS Oral Take 20 mg by mouth daily.      Marland Kitchen DEXAMETHASONE 4 MG PO TABS  4 tabs po at once on day one, 4 tabs po at once on day 2 8 tablet 0  . ESTRADIOL 0.5 MG PO TABS Oral Take 0.5 mg by mouth daily.      Marland Kitchen FERROUS GLUCONATE IRON 246 (28 FE) MG PO TABS Oral Take 246 mg by mouth daily with breakfast.      . HYDROCODONE-ACETAMINOPHEN 7.5-500 MG/15ML PO SOLN Oral Take 5 mLs by mouth every 6 (six) hours as needed for pain. 120 mL 0  . IBUPROFEN 200 MG PO TABS Oral Take 400 mg by mouth at bedtime.      Marland Kitchen LEVOTHYROXINE SODIUM 88 MCG PO TABS Oral Take 88 mcg by mouth daily.      . MECLIZINE HCL 25 MG  PO TABS Oral Take 25 mg by mouth daily as needed.      Marland Kitchen ONE-DAILY MULTI VITAMINS PO TABS Oral Take 1 tablet by mouth daily.      Marland Kitchen FISH OIL 1000 MG PO CAPS Oral Take 1 capsule by mouth daily.      Marland Kitchen VITAMIN C 500 MG PO TABS Oral Take 500 mg by mouth daily.        BP 114/69  Pulse 95  Temp(Src) 98.7 F (37.1 C) (Oral)  Resp 22  Ht 5\' 8"  (1.727 m)  Wt 134 lb 12.8 oz (61.145 kg)  BMI 20.50 kg/m2  SpO2 88%  Physical Exam  Nursing note and vitals reviewed. Constitutional: She is oriented to person, place, and time. She appears well-developed and well-nourished.  Non-toxic appearance. She appears ill. No distress.  HENT:  Head: Normocephalic and atraumatic.  Right Ear: Tympanic membrane, external ear and ear canal normal.  Left Ear: Tympanic membrane, external ear and ear canal normal.  Nose: Mucosal edema and rhinorrhea present.  Mouth/Throat: Posterior oropharyngeal erythema present. No oropharyngeal exudate or posterior oropharyngeal edema.  Eyes: No scleral icterus.  Neck: Neck supple.  Cardiovascular: Regular rhythm and normal heart sounds.     Pulmonary/Chest: Effort normal and breath sounds normal. No respiratory distress. She has no decreased breath sounds. She has no wheezes. She has no rhonchi.  Neurological: She is alert and oriented to person, place, and time.  Skin: Skin is warm and dry.  Psychiatric: She has a normal mood and affect. Her speech is normal.    ED Course  Procedures (including critical care time)  Labs Reviewed - No data to display Dg Chest 2 View  02/25/2011  *RADIOLOGY REPORT*  Clinical Data: Cough, fever  CHEST - 2 VIEW  Comparison: CT chest dated 08/02/2010  Findings: Chronic interstitial markings.  No superimposed opacities suspicious for pneumonia.  No pleural effusion or pneumothorax.  Cardiomediastinal silhouette is within normal limits.  Degenerative changes of the visualized thoracolumbar spine.  IMPRESSION: Chronic interstitial markings.  No superimposed opacities suspicious for pneumonia.  Original Report Authenticated By: Charline Bills, M.D.     No diagnosis found. She had a pulse ox of 87% when she arrived in clinic. We placed her on 2 L of oxygen by nasal cannula and it made her feel much better.    MDM   An x-ray was obtained and is read by radiology as above. Because of her worsening symptoms, I have given her a shot of Rocephin here in clinic as well as a prescription for Levaquin. She is here in clinic with her sister and I advised them if she continues to worsen and have worse shortness of breath or symptoms, they will need to go to the hospital as she may need a further evaluation and blood work.  In addition I have given them pneumonia information and education and advised him to followup with her primary care physician or pulmonologist for repeat x-ray. I did also advised her that she may be tired and coughing for a few weeks but she should start feeling feeling better in the next few days.     Lily Kocher, MD 02/27/11 [phone]

## 2011-02-27 NOTE — ED Notes (Addendum)
Patient c/o productive cough, congestion, fatigue and fever x 4 days. She was seen @ [facility] 2 days ago. She did not receive a flu shot this year.

## 2011-03-02 ENCOUNTER — Telehealth: Payer: Self-pay | Admitting: Family Medicine

## 2011-03-14 ENCOUNTER — Ambulatory Visit (INDEPENDENT_AMBULATORY_CARE_PROVIDER_SITE_OTHER): Payer: Medicare Other | Admitting: Internal Medicine

## 2011-03-14 ENCOUNTER — Encounter: Payer: Self-pay | Admitting: Internal Medicine

## 2011-03-14 DIAGNOSIS — R042 Hemoptysis: Secondary | ICD-10-CM

## 2011-03-14 DIAGNOSIS — J479 Bronchiectasis, uncomplicated: Secondary | ICD-10-CM

## 2011-03-14 NOTE — Progress Notes (Signed)
Patient ID: [name], female    DOB: Jan 25, 1941, 71 y.o.   MRN: [id number]  HPI 08/01/10- 48 yo F never smoker with hx bronchitis and chronic recurrent hemoptysis. Referred courtesy of Dr Mila Palmer. Has noted blood from mouth, mostly at night after she has been asleep, intermittently since at least 2003. Previous ENT eval by Dr Gerilyn Pilgrim, Bronchoscopy by Dr Tanda Rockers as well as pulmonary evaluation by Dr Shelle Iron. No source found. She put herself on aspirin as a self-imposed health measure in April and noted onset of bleeding again about a month after that. Self-limited then and no recurrence since.. Denies fever, sweat, nodes, weight loss or chest pain.  CT chest 08/20/03 - advanced COPD with bronchiectasis and blebs.  Lived in [place] for 5 months in 2005- bleeding preceded that trip. Remote pneumonia. PPD negative.  08/30/10- 16 yo F never smoker with hx bronchitis and chronic recurrent hemoptysis. CT 08/01/10- suggests MAIC with scattered nodularity and bronchiectasis. Some improvement since 12/09/07, but some new areas. Images reviewed with her.  She denies any change. Coughs a little, intermittently on first rising, scant phlegm. Denies fever or night sweats. Weight fluctuates in her range.  No heme since last here.   03/14/11- 71 yo F never smoker with hx bronchitis and chronic recurrent hemoptysis, ? MAIC by CT. PCP Dr Mila Palmer She chose not to have flu shot. Describes heavy exposure to secondhand smoke while growing up. Has not had further hemoptysis. Now resolving acute bronchitis the began with sinusitis right after Christmas. Over-the-counter meds helped. January 5, bad cough [facility] Chest x-ray done, diagnosed viral bronchitis on COPD and treated with antibiotic and prednisone. She then went to [place] to stay at her sister's house. While there she went to an urgent care in [place] on January 11 with a repeat chest x-ray and similar  medications. She seemed better but went back to urgent care at [facility] and was sent to the emergency room. Diagnosed with pneumonia on CT scan at [facility]. Augmentin caused nausea, switched to Z-Pak with prednisone. Now off all antibiotics and prednisone. Using rescue inhaler occasionally. Still some cough with scant clear mucus and rhinorrhea. Some dyspnea on exertion on hills and stairs. No fever or sweating, chest pain or palpitation.  Review of Systems Constitutional:   No-   weight loss, night sweats, fevers, chills, fatigue, lassitude. HEENT:   No-  headaches, difficulty swallowing, tooth/dental problems, sore throat,       No-  sneezing, itching, ear ache, +nasal congestion, post nasal drip,  CV:  No-   chest pain, orthopnea, PND, swelling in lower extremities, anasarca, dizziness, palpitations Resp: +  shortness of breath with exertion or at rest.              N+   productive cough,  No non-productive cough,  No- coughing up of blood.              No-   change in color of mucus.  No- wheezing.   Skin: No-   rash or lesions. GI:  No-   heartburn, indigestion, abdominal pain, nausea, vomiting, diarrhea,                 change in bowel habits, loss of appetite GU:  MS:  No-   joint pain or swelling.  No- decreased range of motion.  No- back pain. Neuro-     nothing unusual Psych:  No- change  in mood or affect. No depression or anxiety.  No memory loss.      Objective:   Physical Exam General- Alert, Oriented, Affect-appropriate, Distress- none acute Skin- rash-none, lesions- none, excoriation- none Lymphadenopathy- none Head- atraumatic            Eyes- Gross vision intact, PERRLA, conjunctivae clear secretions            Ears- Hearing, canals-normal            Nose- Clear, no-Septal dev, mucus, polyps, erosion, perforation             Throat- Mallampati II , mucosa clear , drainage- none, tonsils- atrophic Neck- flexible , trachea midline, no  stridor , thyroid nl, carotid no bruit Chest - symmetrical excursion , unlabored           Heart/CV- RRR , no murmur , no gallop  , no rub, nl s1 s2                           - JVD- none , edema- none, stasis changes- none, varices- none           Lung- clear to P&A, wheeze- none, cough- none , dullness-none, rub- none           Chest wall-  Abd- Br/ Gen/ Rectal- Not done, not indicated Extrem- cyanosis- none, clubbing, none, atrophy- none, strength- nl Neuro- grossly intact to observation

## 2011-03-14 NOTE — Patient Instructions (Addendum)
Please sign release so we can get the report of your recent chest Ct from [facility] for our record  Please call as needed  Let's plan on getting an annual flu shot from next year on

## 2011-03-15 ENCOUNTER — Telehealth: Payer: Self-pay | Admitting: Internal Medicine

## 2011-03-15 NOTE — Telephone Encounter (Signed)
Received copies from [facility],on 1.24.13 . Forwarded 28 pages to Dr. Maple Hudson ,for review. sj

## 2011-03-17 NOTE — Assessment & Plan Note (Signed)
Consistent with a hemorrhagic bronchitis associated with an exacerbation of her bronchiectasis. As of this visit there has been no recurrence.

## 2011-03-17 NOTE — Assessment & Plan Note (Signed)
Recent viral upper respiratory infection with bronchitis pattern developed into a pneumonia based on report from her. We will try to get that CT scan report from the urgent care Center [facility] with [facility]. She has had several antibiotics and 2 courses of prednisone.

## 2011-07-04 ENCOUNTER — Other Ambulatory Visit: Payer: Self-pay | Admitting: Family Medicine

## 2011-07-04 DIAGNOSIS — Z1231 Encounter for screening mammogram for malignant neoplasm of breast: Secondary | ICD-10-CM

## 2011-07-23 ENCOUNTER — Ambulatory Visit
Admission: RE | Admit: 2011-07-23 | Discharge: 2011-07-23 | Disposition: A | Discharge status: Home / Self Care | Payer: Medicare Other | Source: Ambulatory Visit | Attending: Family Medicine | Admitting: Family Medicine

## 2011-07-23 DIAGNOSIS — Z1231 Encounter for screening mammogram for malignant neoplasm of breast: Secondary | ICD-10-CM

## 2011-09-11 ENCOUNTER — Encounter: Payer: Self-pay | Admitting: Internal Medicine

## 2011-09-11 ENCOUNTER — Ambulatory Visit (INDEPENDENT_AMBULATORY_CARE_PROVIDER_SITE_OTHER)
Admission: RE | Admit: 2011-09-11 | Discharge: 2011-09-11 | Disposition: A | Discharge status: Home / Self Care | Payer: Medicare Other | Source: Ambulatory Visit | Attending: Internal Medicine | Admitting: Internal Medicine

## 2011-09-11 ENCOUNTER — Ambulatory Visit (INDEPENDENT_AMBULATORY_CARE_PROVIDER_SITE_OTHER): Payer: Medicare Other | Admitting: Internal Medicine

## 2011-09-11 VITALS — BP 120/78 | HR 97 | Ht 67.0 in | Wt 135.6 lb

## 2011-09-11 DIAGNOSIS — J479 Bronchiectasis, uncomplicated: Secondary | ICD-10-CM

## 2011-09-11 DIAGNOSIS — J42 Unspecified chronic bronchitis: Secondary | ICD-10-CM

## 2011-09-11 IMAGING — CR DG CHEST 2V
2 series · 2 of 2 positions shown · non-contrast
Comparison: [DATE]

CLINICAL DATA: Chronic bronchitis

CHEST - 2 VIEW

[view not recorded (1 of 2)]
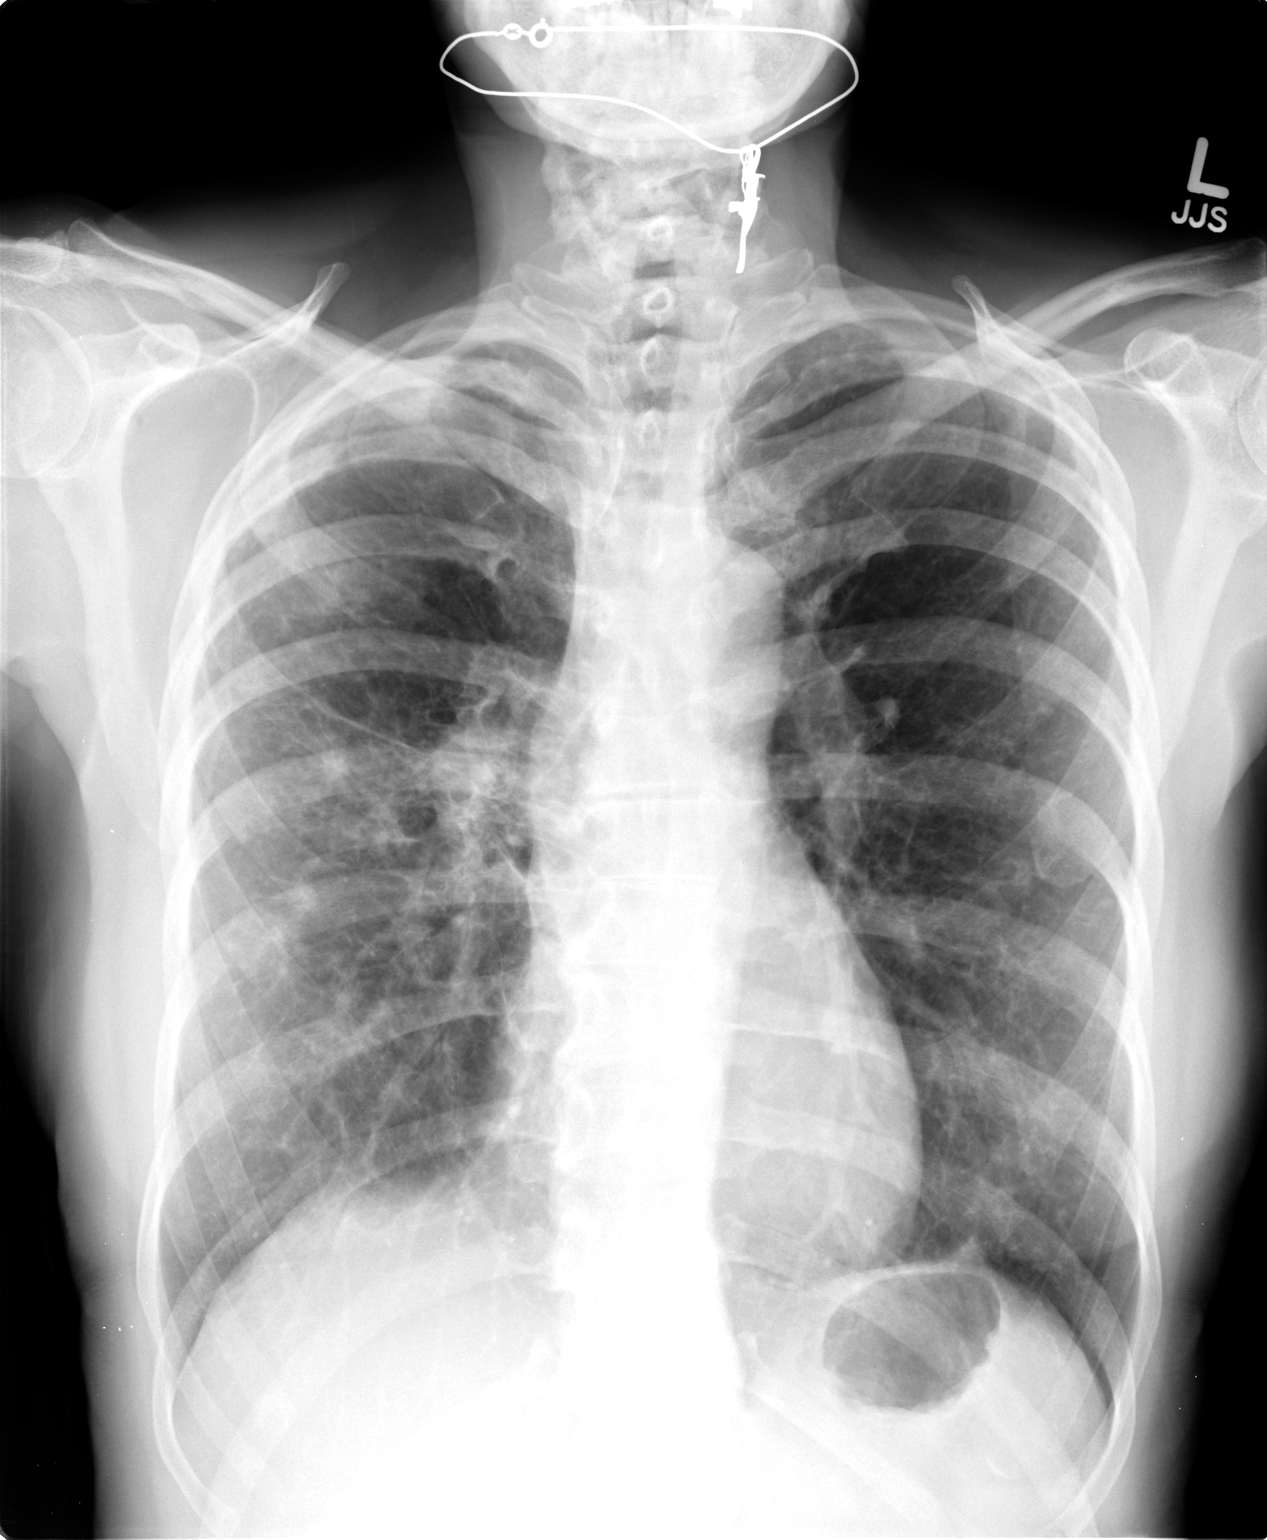

[view not recorded (2 of 2)]
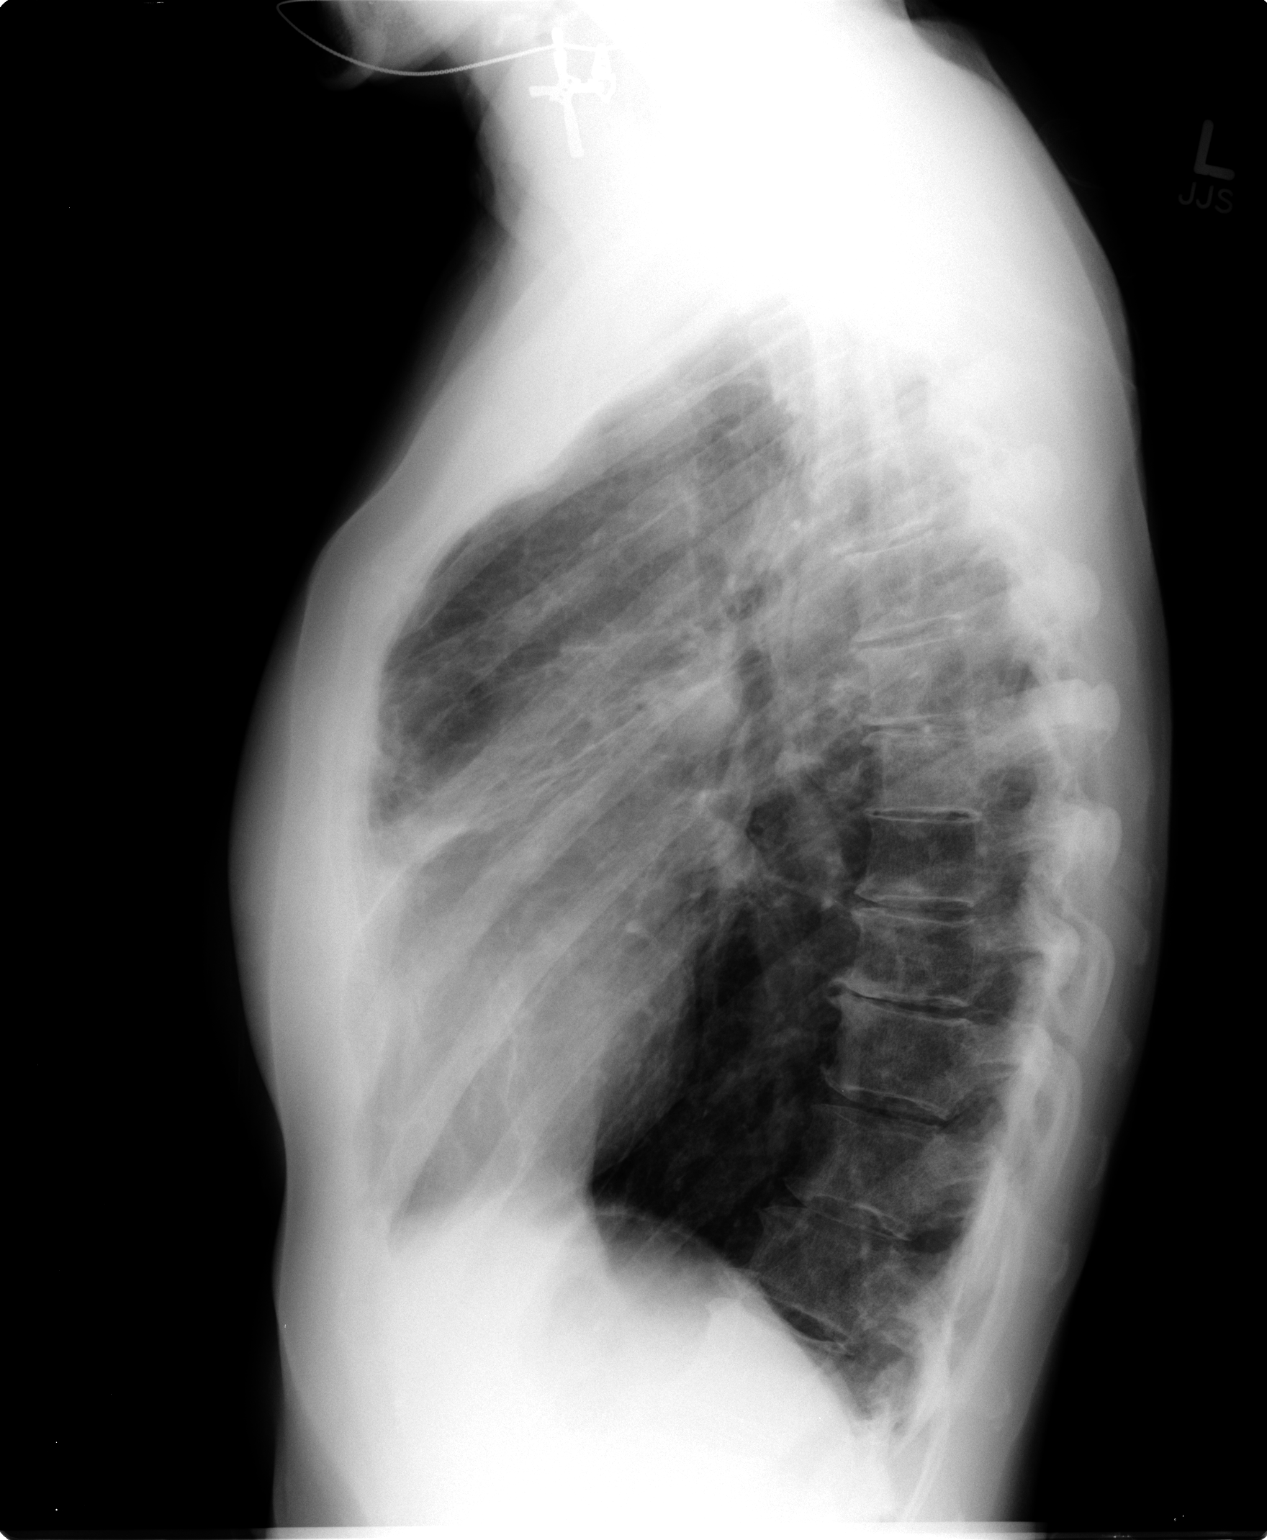

[2 of 2 positions shown; findings below may reference images not displayed]

FINDINGS: Cardiomediastinal silhouette is stable.  Stable
hyperinflation and chronic interstitial prominence.  Stable right
basilar atelectasis or scarring.  Stable pleuroparenchymal scarring
in the right apex.  No acute infiltrate or pulmonary edema.  Stable
scarring left base.  Degenerative changes thoracic spine again
noted.
IMPRESSION: No active disease. Stable hyperinflation and chronic interstitial
prominence.

## 2011-09-11 NOTE — Progress Notes (Signed)
Patient ID: [name], female    DOB: [birth year], 71 y.o.   MRN: [id number]  HPI 08/01/10- 7 yo F never smoker with hx bronchitis and chronic recurrent hemoptysis. Referred courtesy of Dr Mila Palmer. Has noted blood from mouth, mostly at night after she has been asleep, intermittently since at least 2003. Previous ENT eval by Dr Gerilyn Pilgrim, Bronchoscopy by Dr Tanda Rockers as well as pulmonary evaluation by Dr Shelle Iron. No source found. She put herself on aspirin as a self-imposed health measure in April and noted onset of bleeding again about a month after that. Self-limited then and no recurrence since.. Denies fever, sweat, nodes, weight loss or chest pain.  CT chest 08/20/03 - advanced COPD with bronchiectasis and blebs.  Lived in [place] for 5 months in 2005- bleeding preceded that trip. Remote pneumonia. PPD negative.  08/30/10- 89 yo F never smoker with hx bronchitis and chronic recurrent hemoptysis. CT 08/01/10- suggests MAIC with scattered nodularity and bronchiectasis. Some improvement since 12/09/07, but some new areas. Images reviewed with her.  She denies any change. Coughs a little, intermittently on first rising, scant phlegm. Denies fever or night sweats. Weight fluctuates in her range.  No heme since last here.   03/14/11- 12 yo F never smoker with hx bronchitis and chronic recurrent hemoptysis, ? MAIC by CT. PCP Dr Mila Palmer She chose not to have flu shot. Describes heavy exposure to secondhand smoke while growing up. Has not had further hemoptysis. Now resolving acute bronchitis the began with sinusitis right after Christmas. Over-the-counter meds helped. January 5, bad cough [facility] Chest x-ray done, diagnosed viral bronchitis on COPD and treated with antibiotic and prednisone. She then went to [facility] to stay at her sister's house. While there she went to an urgent care in [place] on January 11 with a repeat chest x-ray and similar  medications. She seemed better but went back to urgent care at [facility] and was sent to the emergency room. Diagnosed with pneumonia on CT scan at [facility]. Augmentin caused nausea, switched to Z-Pak with prednisone. Now off all antibiotics and prednisone. Using rescue inhaler occasionally. Still some cough with scant clear mucus and rhinorrhea. Some dyspnea on exertion on hills and stairs. No fever or sweating, chest pain or palpitation.  08/22/11- 36 yo F never smoker with hx bronchitis and chronic recurrent hemoptysis, ? MAIC by CT. PCP Dr Mila Palmer c/o sob with exertion otherwise doing good.  Last imaging was CT scan at [facility] January 2013. Did finally get flu vaccine in January. Does not go outside very much. Keeping a 60-year-old granddaughter and we discussed exposure to colds. Little cough. One episode of streaky hemoptysis in April. Denies sweats, fever, adenopathy, chest pain, palpitation.  Review of Systems-see HPI Constitutional:   No-   weight loss, night sweats, fevers, chills, fatigue, lassitude. HEENT:   No-  headaches, difficulty swallowing, tooth/dental problems, sore throat,       No-  sneezing, itching, ear ache, +nasal congestion, post nasal drip,  CV:  No-   chest pain, orthopnea, PND, swelling in lower extremities, anasarca, dizziness, palpitations Resp: +  shortness of breath with exertion or at rest.              Rare   productive cough,  No non-productive cough,  No- recent coughing up of blood.              No-   change in color of  mucus.  No- wheezing.   Skin: No-   rash or lesions. GI:  No-   heartburn, indigestion, abdominal pain, nausea, vomiting,  GU:  MS:  No-   joint pain or swelling.  . Neuro-     nothing unusual Psych:  No- change in mood or affect. No depression or anxiety.  No memory loss.  Objective:   Physical Exam General- Alert, Oriented, Affect-appropriate, Distress- none acute Skin- rash-none, lesions- none,  excoriation- none Lymphadenopathy- none Head- atraumatic            Eyes- Gross vision intact, PERRLA, conjunctivae clear secretions            Ears- Hearing, canals-normal            Nose- Clear, no-Septal dev, mucus, polyps, erosion, perforation             Throat- Mallampati II , mucosa clear , drainage- none, tonsils- atrophic Neck- flexible , trachea midline, no stridor , thyroid nl, carotid no bruit Chest - symmetrical excursion , unlabored           Heart/CV- RRR , no murmur , no gallop  , no rub, nl s1 s2                           - JVD- none , edema- none, stasis changes- none, varices- none           Lung- light wheeze left base, wheeze- none, cough- none , dullness-none, rub- none           Chest wall-  Abd- Br/ Gen/ Rectal- Not done, not indicated Extrem- cyanosis- none, clubbing, none, atrophy- none, strength- nl. Brace right hand for carpal tunnel Neuro- grossly intact to observation

## 2011-09-11 NOTE — Patient Instructions (Addendum)
Order- CXR dx chronic bronchitis   Please call as needed  

## 2011-09-16 NOTE — Assessment & Plan Note (Signed)
This is probably subclinical MAIC. We can follow in the absence of obvious progression.

## 2011-09-19 ENCOUNTER — Telehealth: Payer: Self-pay | Admitting: Internal Medicine

## 2011-09-19 NOTE — Progress Notes (Signed)
Quick Note:  Spoke with pt. Informed her of cxr results per Dr. Maple Hudson. She verbalized understanding of this. ______

## 2011-09-19 NOTE — Telephone Encounter (Signed)
Result Notes     Notes Recorded by Waymon Budge, MD on 09/11/2011 at 8:43 PM CXR- stable chronic changes   ----  Called, spoke with pt.  I informed her of cxr results per Dr. Maple Hudson.  She verbalized understanding of this and voiced no further questions/concerns at this time.

## 2012-03-14 ENCOUNTER — Ambulatory Visit (INDEPENDENT_AMBULATORY_CARE_PROVIDER_SITE_OTHER): Payer: Medicare Other | Admitting: Internal Medicine

## 2012-03-14 ENCOUNTER — Ambulatory Visit (INDEPENDENT_AMBULATORY_CARE_PROVIDER_SITE_OTHER)
Admission: RE | Admit: 2012-03-14 | Discharge: 2012-03-14 | Disposition: A | Discharge status: Home / Self Care | Payer: Medicare Other | Source: Ambulatory Visit | Attending: Internal Medicine | Admitting: Internal Medicine

## 2012-03-14 ENCOUNTER — Encounter: Payer: Self-pay | Admitting: Internal Medicine

## 2012-03-14 VITALS — BP 114/66 | HR 85 | Ht 67.0 in | Wt 135.0 lb

## 2012-03-14 DIAGNOSIS — J42 Unspecified chronic bronchitis: Secondary | ICD-10-CM

## 2012-03-14 DIAGNOSIS — J479 Bronchiectasis, uncomplicated: Secondary | ICD-10-CM

## 2012-03-14 IMAGING — CR DG CHEST 2V
2 series · 2 of 2 positions shown · non-contrast
Comparison: [DATE], [DATE], and CT chest [DATE]

CLINICAL DATA: Chronic bronchitis.

CHEST - 2 VIEW

[view not recorded (1 of 2)]
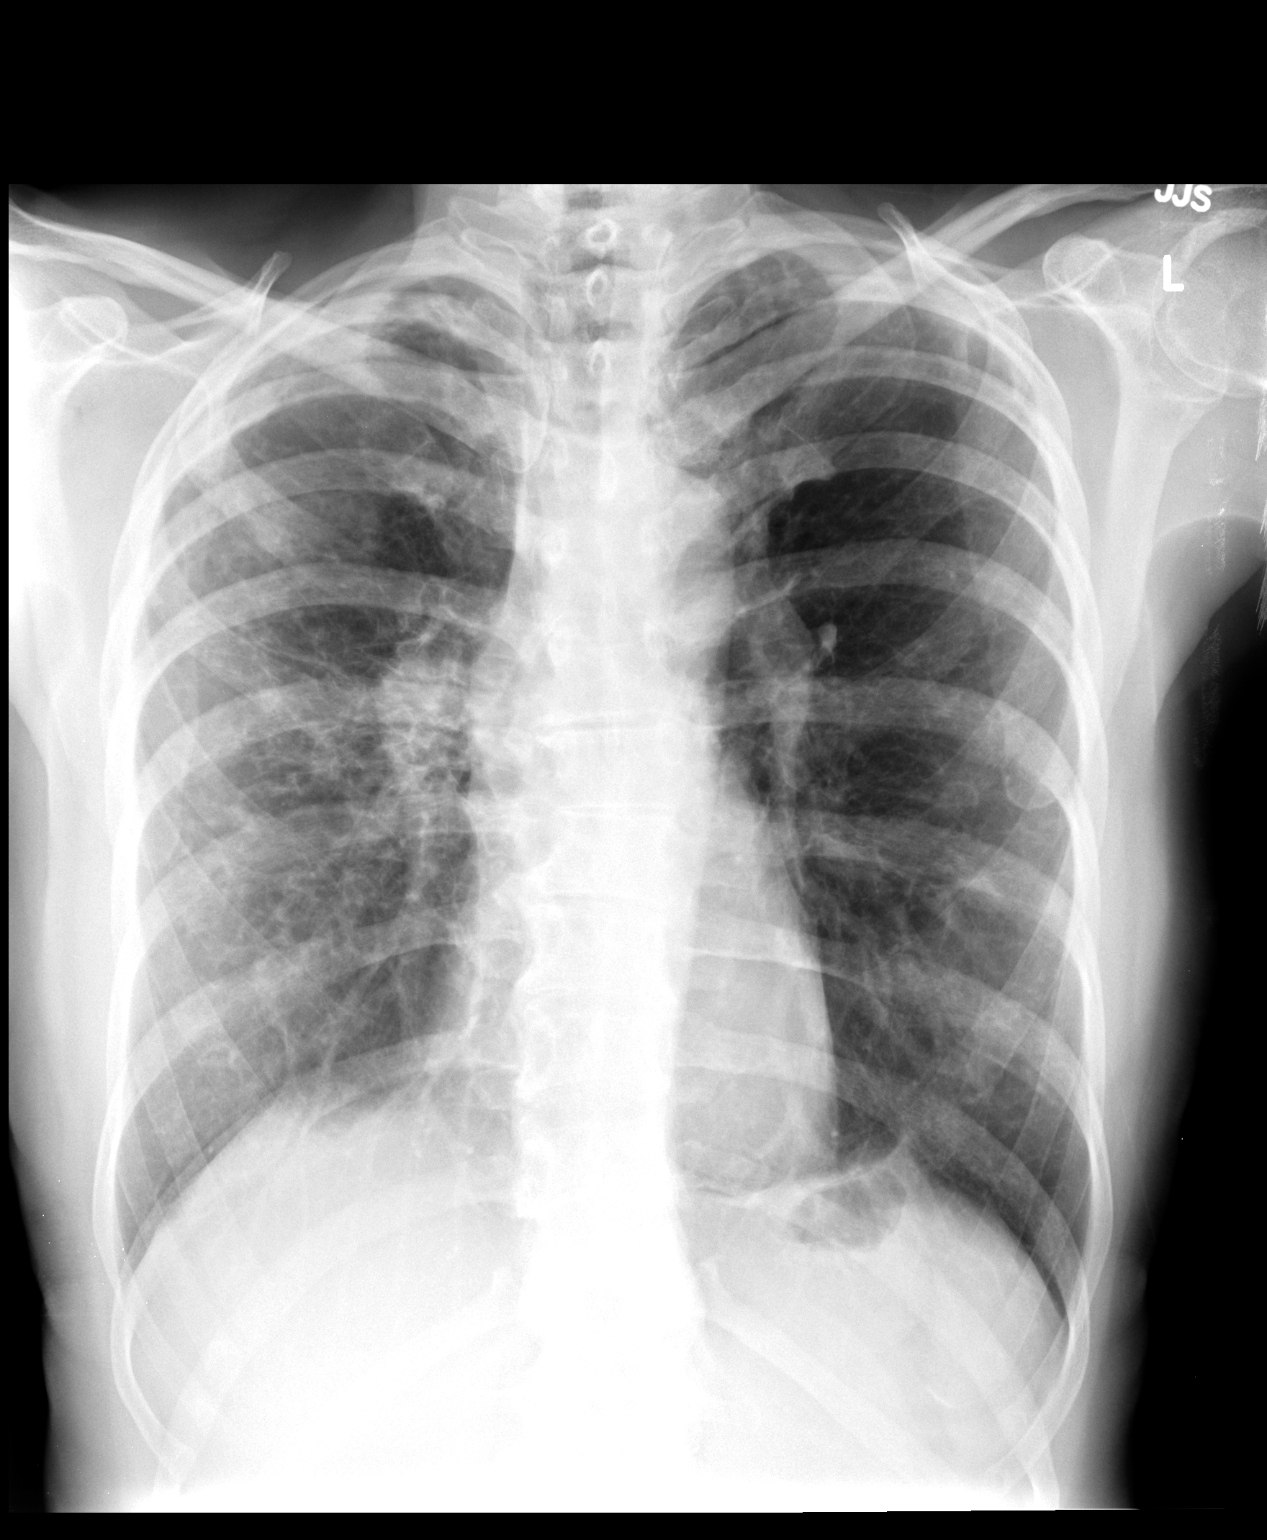

[view not recorded (2 of 2)]
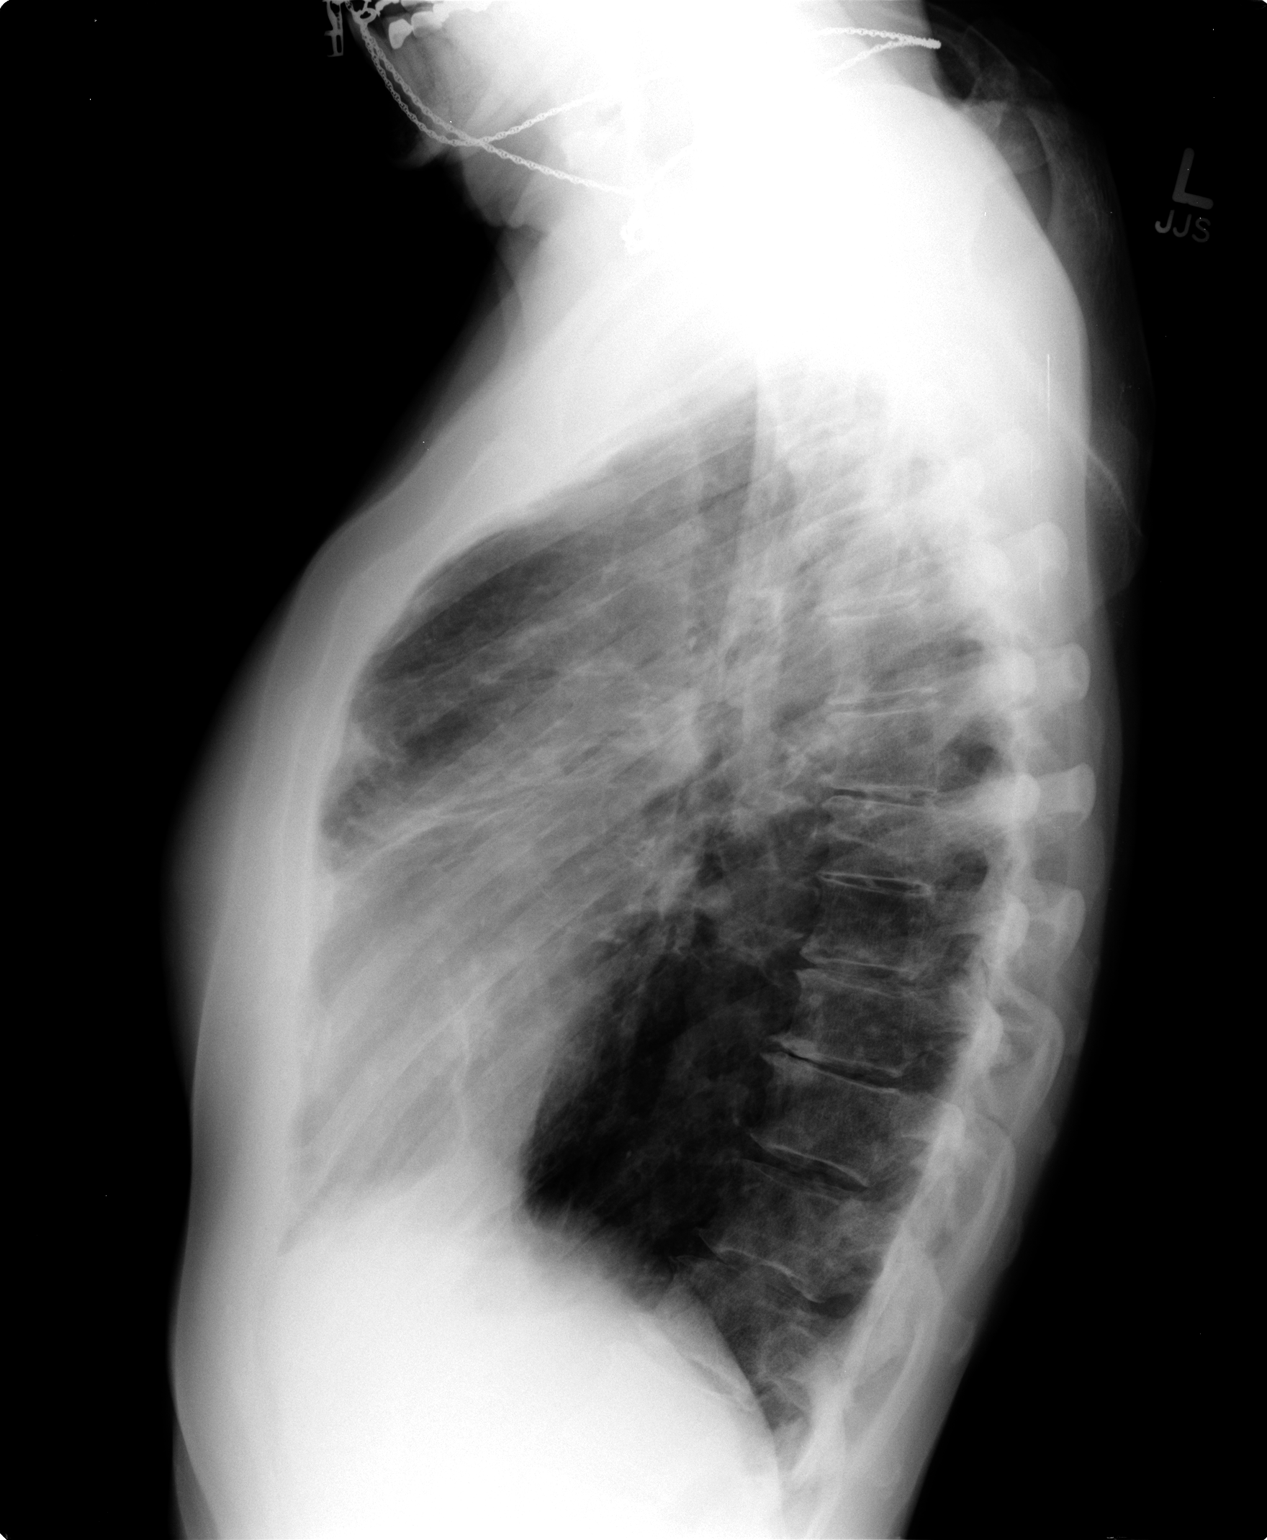

[2 of 2 positions shown; findings below may reference images not displayed]

FINDINGS: Heart and thoracic aorta contours are stable and normal.
Slight stable prominence of the hila bilaterally.  There is diffuse
peribronchial thickening, right worse than left, with suggestion of
bronchiectasis at the lung bases. Slight reticulonodular pattern in
the right lung appears unchanged.  No focal consolidation or
pleural effusion.  Linear areas of scarring are seen anteriorly on
the lateral view and at both lung bases. Negative for pleural
effusion.  No acute osseous abnormality.
IMPRESSION: No significant radiographic change in the chest since [DATE].
Peribronchial thickening bilaterally with slight reticulonodular
pattern in the right lung and bilateral areas of bronchiectasis.
Given findings on the chest CT [DATE], the possibility of a
chronic persistent Mycobacterium avium complex infection cannot be
excluded.

## 2012-03-14 NOTE — Patient Instructions (Addendum)
Order- CXr dx chronic bronchitis              Lab - sputum smear and cx for AFB

## 2012-03-14 NOTE — Progress Notes (Signed)
Patient ID: [name], female    DOB: 1940-04-14, 72 y.o.   MRN: [id number]  HPI 08/01/10- 11 yo F never smoker with hx bronchitis and chronic recurrent hemoptysis. Referred courtesy of Dr Mila Palmer. Has noted blood from mouth, mostly at night after she has been asleep, intermittently since at least 2003. Previous ENT eval by Dr Gerilyn Pilgrim, Bronchoscopy by Dr Tanda Rockers as well as pulmonary evaluation by Dr Shelle Iron. No source found. She put herself on aspirin as a self-imposed health measure in April and noted onset of bleeding again about a month after that. Self-limited then and no recurrence since.. Denies fever, sweat, nodes, weight loss or chest pain.  CT chest 08/20/03 - advanced COPD with bronchiectasis and blebs.  Lived in [place] for 5 months in 2005- bleeding preceded that trip. Remote pneumonia. PPD negative.  08/30/10- 47 yo F never smoker with hx bronchitis and chronic recurrent hemoptysis. CT 08/01/10- suggests MAIC with scattered nodularity and bronchiectasis. Some improvement since 12/09/07, but some new areas. Images reviewed with her.  She denies any change. Coughs a little, intermittently on first rising, scant phlegm. Denies fever or night sweats. Weight fluctuates in her range.  No heme since last here.   03/14/11- 23 yo F never smoker with hx bronchitis and chronic recurrent hemoptysis, ? MAIC by CT. PCP Dr Mila Palmer She chose not to have flu shot. Describes heavy exposure to secondhand smoke while growing up. Has not had further hemoptysis. Now resolving acute bronchitis the began with sinusitis right after Christmas. Over-the-counter meds helped. January 5, bad cough [facility] Chest x-ray done, diagnosed viral bronchitis on COPD and treated with antibiotic and prednisone. She then went to [facility] to stay at her sister's house. While there she went to an urgent care in [place] on January 11 with a repeat chest x-ray and similar  medications. She seemed better but went back to urgent care at [facility] and was sent to the emergency room. Diagnosed with pneumonia on CT scan at [facility]. Augmentin caused nausea, switched to Z-Pak with prednisone. Now off all antibiotics and prednisone. Using rescue inhaler occasionally. Still some cough with scant clear mucus and rhinorrhea. Some dyspnea on exertion on hills and stairs. No fever or sweating, chest pain or palpitation.  08/22/11- 54 yo F never smoker with hx bronchitis and chronic recurrent hemoptysis, ? MAIC by CT. PCP Dr Mila Palmer c/o sob with exertion otherwise doing good.  Last imaging was CT scan at [facility] January 2013. Did finally get flu vaccine in January. Does not go outside very much. Keeping a 83-year-old granddaughter and we discussed exposure to colds. Little cough. One episode of streaky hemoptysis in April. Denies sweats, fever, adenopathy, chest pain, palpitation.  03/14/12- 69 yo F never smoker with hx bronchitis and chronic recurrent hemoptysis, ? MAIC by CT. PCP Dr Mila Palmer FOLLOWS FOR: cough at times-slightly productive-green in color. Denies any wheezing,SOB, or congestion. History of recurrent bronchitis and pneumonias. PFT 2012-air trapping. a1AT 08/08/10- Nl MM CXR 09/11/11-reviewed. Significant chronic scarring. IMPRESSION:  No active disease. Stable hyperinflation and chronic interstitial  prominence.  Original Report Authenticated By: Natasha Mead, M.D.  Review of Systems-see HPI Constitutional:   No-   weight loss, night sweats, fevers, chills, fatigue, lassitude. HEENT:   No-  headaches, difficulty swallowing, tooth/dental problems, sore throat,       No-  sneezing, itching, ear ache, nasal congestion, post nasal drip,  CV:  No-  chest pain, orthopnea, PND, swelling in lower extremities, anasarca, dizziness, palpitations Resp: +  shortness of breath with exertion or at rest.              Rare   productive  cough,  No non-productive cough,  No- recent coughing up of blood.              No-   change in color of mucus.  No- wheezing.   Skin: No-   rash or lesions. GI:  No-   heartburn, indigestion, abdominal pain, nausea, vomiting,  GU:  MS:  No-   joint pain or swelling.  . Neuro-     nothing unusual Psych:  No- change in mood or affect. No depression or anxiety.  No memory loss.  Objective:   Physical Exam General- Alert, Oriented, Affect-cheerful, Distress- none acute Skin- rash-none, lesions- none, excoriation- none Lymphadenopathy- none Head- atraumatic            Eyes- Gross vision intact, PERRLA, conjunctivae clear secretions            Ears- Hearing, canals-normal            Nose- Clear, no-Septal dev, mucus, polyps, erosion, perforation             Throat- Mallampati II , mucosa clear , drainage- none, tonsils- atrophic Neck- flexible , trachea midline, no stridor , thyroid nl, carotid no bruit Chest - symmetrical excursion , unlabored           Heart/CV- RRR , no murmur , no gallop  , no rub, nl s1 s2                           - JVD- none , edema- none, stasis changes- none, varices- none           Lung- quiet, unlabored, wheeze- none, cough- none , dullness-none, rub- none           Chest wall-  Abd-  Br/ Gen/ Rectal- Not done, not indicated Extrem- cyanosis- none, clubbing, none, atrophy- none, strength- nl. Brace right hand for carpal tunnel Neuro- grossly intact to observation

## 2012-03-18 NOTE — Progress Notes (Signed)
Quick Note:  Pt aware of results. ______ 

## 2012-03-25 NOTE — Assessment & Plan Note (Signed)
We're watching for slow chronic infection-like MAIC. Plan-update chest x-ray, sputum culture for AFB

## 2012-08-04 ENCOUNTER — Other Ambulatory Visit: Payer: Self-pay

## 2012-08-04 ENCOUNTER — Other Ambulatory Visit: Payer: Self-pay | Admitting: Family Medicine

## 2012-08-04 DIAGNOSIS — Z1231 Encounter for screening mammogram for malignant neoplasm of breast: Secondary | ICD-10-CM

## 2012-08-28 ENCOUNTER — Ambulatory Visit
Admission: RE | Admit: 2012-08-28 | Discharge: 2012-08-28 | Disposition: A | Discharge status: Home / Self Care | Payer: Medicare Other | Source: Ambulatory Visit

## 2012-08-28 DIAGNOSIS — Z1231 Encounter for screening mammogram for malignant neoplasm of breast: Secondary | ICD-10-CM

## 2012-09-12 ENCOUNTER — Ambulatory Visit (INDEPENDENT_AMBULATORY_CARE_PROVIDER_SITE_OTHER): Payer: Medicare Other | Admitting: Internal Medicine

## 2012-09-12 ENCOUNTER — Ambulatory Visit (INDEPENDENT_AMBULATORY_CARE_PROVIDER_SITE_OTHER)
Admission: RE | Admit: 2012-09-12 | Discharge: 2012-09-12 | Disposition: A | Discharge status: Home / Self Care | Payer: Medicare Other | Source: Ambulatory Visit | Attending: Internal Medicine | Admitting: Internal Medicine

## 2012-09-12 ENCOUNTER — Encounter: Payer: Self-pay | Admitting: Internal Medicine

## 2012-09-12 ENCOUNTER — Other Ambulatory Visit (INDEPENDENT_AMBULATORY_CARE_PROVIDER_SITE_OTHER): Payer: Medicare Other

## 2012-09-12 VITALS — BP 142/74 | HR 106 | Ht 67.0 in | Wt 131.6 lb

## 2012-09-12 DIAGNOSIS — J479 Bronchiectasis, uncomplicated: Secondary | ICD-10-CM

## 2012-09-12 DIAGNOSIS — J441 Chronic obstructive pulmonary disease with (acute) exacerbation: Secondary | ICD-10-CM

## 2012-09-12 LAB — CBC WITH DIFFERENTIAL/PLATELET
Eosinophils Absolute: 0.4 10*3/uL (ref 0.0–0.7)
Eosinophils Relative: 5.5 % — ABNORMAL HIGH (ref 0.0–5.0)
HCT: 40.6 % (ref 36.0–46.0)
Lymphs Abs: 1.8 10*3/uL (ref 0.7–4.0)
MCHC: 33.5 g/dL (ref 30.0–36.0)
MCV: 94 fl (ref 78.0–100.0)
Monocytes Absolute: 0.9 10*3/uL (ref 0.1–1.0)
Platelets: 235 10*3/uL (ref 150.0–400.0)
RBC: 4.32 Mil/uL (ref 3.87–5.11)
RDW: 14.2 % (ref 11.5–14.6)

## 2012-09-12 IMAGING — CT CT CHEST W/O CM
3 of 8 series · 9 of 36 positions shown, 10 images · non-contrast
Comparison: Chest CT [DATE].

CLINICAL DATA: Bronchiectasis.  Cough.

CT CHEST WITHOUT CONTRAST
TECHNIQUE: Multidetector CT imaging of the chest was performed
following the standard protocol without IV contrast.

[Series 7: high res lung 1x10 · axial · 0.66mm/px · z∈[-22,+128]mm · 3 of 31 slices shown, 4 images]
[im 8/31  mediastinal]
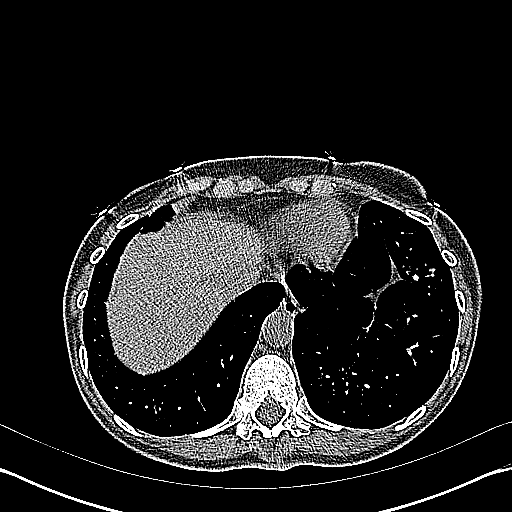
[im 8/31  lung]
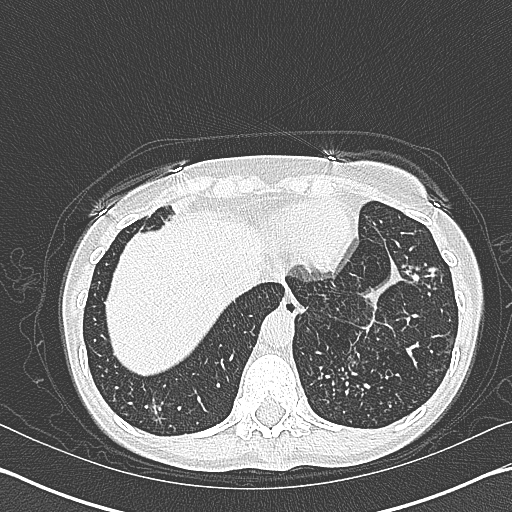
[im 16/31  lung]
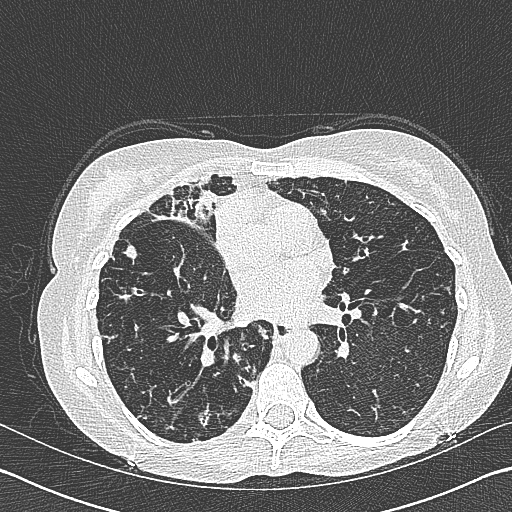
[im 23/31  lung]
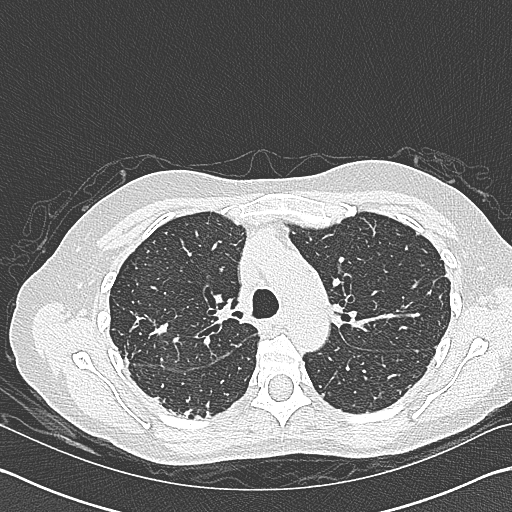

[Series 9: hires retro entire lungs · axial · 0.66mm/px · z∈[-22,+128]mm · 3 of 31 slices shown]
[im 8/31  lung]
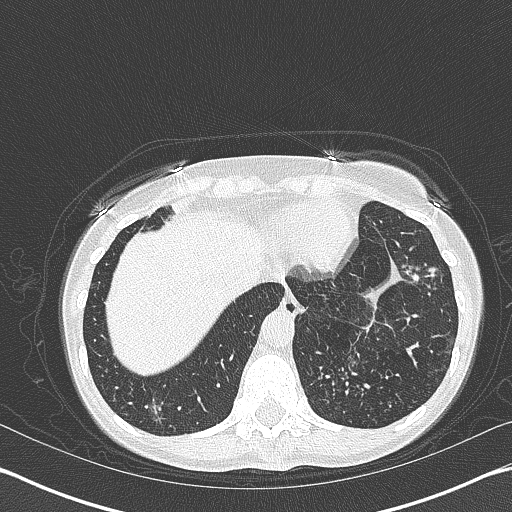
[im 16/31  lung]
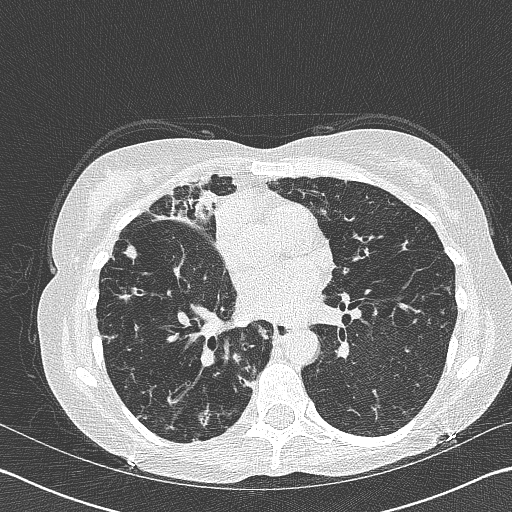
[im 23/31  lung]
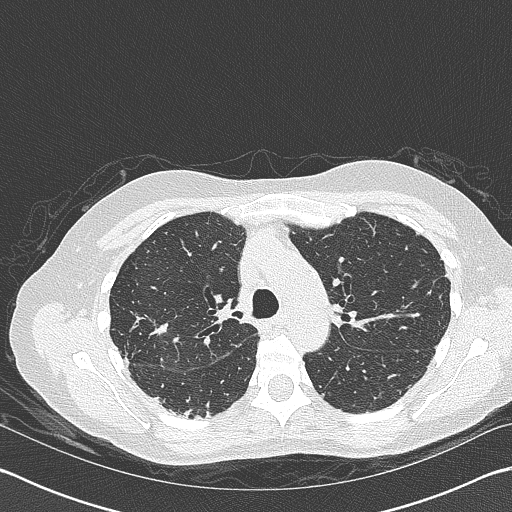

[Series 602: cor · coronal · 0.66mm/px · 3 of 115 slices shown]
[im 23/115  lung]
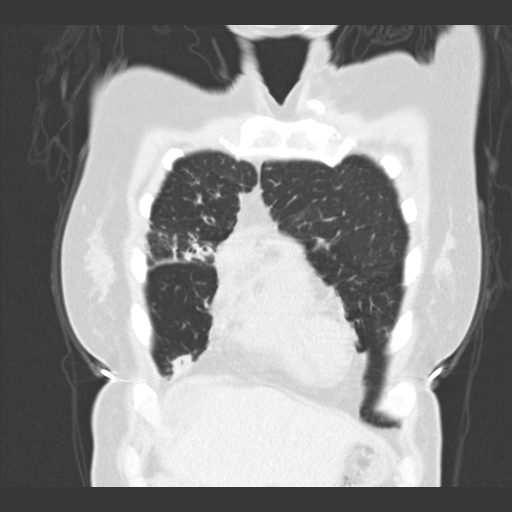
[im 46/115  lung]
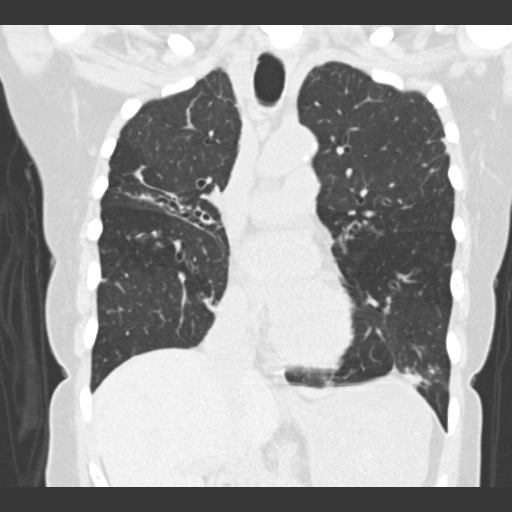
[im 69/115  lung]
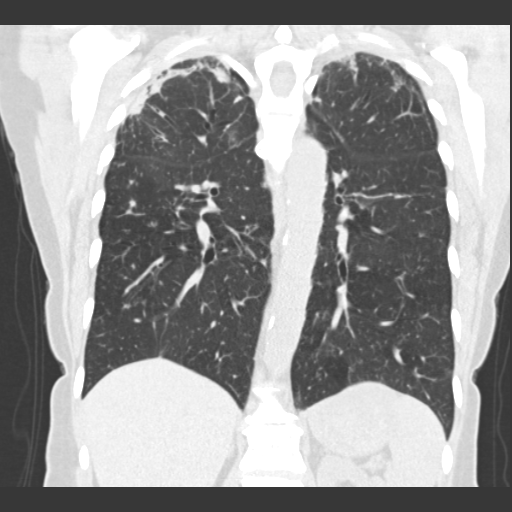

[9 of 36 positions shown; findings below may reference images not displayed]

FINDINGS: Mediastinum: Heart size is normal. There is no significant
pericardial fluid, thickening or pericardial calcification. No
pathologically enlarged mediastinal or hilar lymph nodes. Please
note that accurate exclusion of hilar adenopathy is limited on
noncontrast CT scans.  Esophagus is unremarkable in appearance.
Mild atherosclerosis throughout the thoracic aorta.  Calcifications
of the left anterior descending coronary artery.

Lungs/Pleura: As with the prior examination there are patchy areas
of bronchial wall thickening with mild cylindrical and varicose
bronchiectasis.  The areas of greatest involvement have extensive
peribronchovascular micro- and macronodularity.  Findings are most
severe in the right middle and lower lobes, however, there is
disease throughout the lungs bilaterally.  The overall appearance
of the lungs is very similar to the prior examination, however,
there is increasing peribronchovascular micronodularity throughout
the lung bases bilaterally, particularly in the left lower lobe,
likely reflect developing areas of mucoid impaction within terminal
bronchioles.  There is also extensive bilateral apical
pleuroparenchymal scarring. In addition, there is a new thick-
walled cavitary nodule in the right lower lobe (image 22 of series
6) measuring approximate 1.3 x 1.0 cm.  No pleural effusions.  High
resolution images demonstrate no areas of subpleural reticulation,
parenchymal banding, ground-glass attenuation or honeycombing to
suggest an underlying interstitial lung disease.  Inspiratory and
expiratory imaging demonstrate large areas of lucency on the
expiratory phase imaging, suggestive of air trapping.

Upper Abdomen: Unremarkable.

Musculoskeletal: There are no aggressive appearing lytic or blastic
lesions noted in the visualized portions of the skeleton.
IMPRESSION: 1.  The appearance of the lungs is again most compatible with a
chronic indolent atypical infectious process such as JIM
(Mycobacterium avium-intracellulare).  The disease appears slightly
progressed compared to the prior study, as discussed above.
2.  Extensive air trapping in the lungs bilaterally.
3.  No findings to suggest an interstitial lung disease at this
time.
4.  Atherosclerosis, including left anterior descending coronary
artery disease.

## 2012-09-12 MED ORDER — IOHEXOL 300 MG/ML  SOLN: 100.0000 mL | Freq: Once | INTRAMUSCULAR | Status: AC | PRN

## 2012-09-12 MED ORDER — LEVALBUTEROL HCL 0.63 MG/3ML IN NEBU
0.6300 mg | INHALATION_SOLUTION | Freq: Once | RESPIRATORY_TRACT | Status: AC
  Administered 2012-09-12: 0.63 mg via RESPIRATORY_TRACT

## 2012-09-12 MED ORDER — DOXYCYCLINE HYCLATE 100 MG PO TABS: ORAL_TABLET | ORAL | Status: DC

## 2012-09-12 NOTE — Progress Notes (Signed)
Patient ID: [name], female    DOB: 10-13-1940, 72 y.o.   MRN: [id number]  HPI 08/01/10- 59 yo F never smoker with hx bronchitis and chronic recurrent hemoptysis. Referred courtesy of Dr Jonathon Jordan. Has noted blood from mouth, mostly at night after she has been asleep, intermittently since at least 2003. Previous ENT eval by Dr Edison Nasuti, Bronchoscopy by Dr Nils Pyle as well as pulmonary evaluation by Dr Gwenette Greet. No source found. She put herself on aspirin as a self-imposed health measure in April and noted onset of bleeding again about a month after that. Self-limited then and no recurrence since.. Denies fever, sweat, nodes, weight loss or chest pain.  CT chest 08/20/03 - advanced COPD with bronchiectasis and blebs.  Lived in [place] for 5 months in 2005- bleeding preceded that trip. Remote pneumonia. PPD negative.  08/30/10- 10 yo F never smoker with hx bronchitis and chronic recurrent hemoptysis. CT 08/01/10- suggests MAIC with scattered nodularity and bronchiectasis. Some improvement since 12/09/07, but some new areas. Images reviewed with her.  She denies any change. Coughs a little, intermittently on first rising, scant phlegm. Denies fever or night sweats. Weight fluctuates in her range.  No heme since last here.   03/14/11- 68 yo F never smoker with hx bronchitis and chronic recurrent hemoptysis, ? MAIC by CT. PCP Dr Jonathon Jordan She chose not to have flu shot. Describes heavy exposure to secondhand smoke while growing up. Has not had further hemoptysis. Now resolving acute bronchitis the began with sinusitis right after Christmas. Over-the-counter meds helped. January 5, bad cough [facility] Chest x-ray done, diagnosed viral bronchitis on COPD and treated with antibiotic and prednisone. She then went to [facility] to stay at her sister's house. While there she went to an urgent care in [place] on January 11 with a repeat chest x-ray and similar  medications. She seemed better but went back to urgent care at  [facility] and was sent to the emergency room. Diagnosed with pneumonia on CT scan at [facility]. Augmentin caused nausea, switched to Z-Pak with prednisone. Now off all antibiotics and prednisone. Using rescue inhaler occasionally. Still some cough with scant clear mucus and rhinorrhea. Some dyspnea on exertion on hills and stairs. No fever or sweating, chest pain or palpitation.  08/22/11- 53 yo F never smoker with hx bronchitis and chronic recurrent hemoptysis, ? MAIC by CT. PCP Dr Jonathon Jordan c/o sob with exertion otherwise doing good.  Last imaging was CT scan at [facility] January 2013. Did finally get flu vaccine in January. Does not go outside very much. Keeping a 53-year-old granddaughter and we discussed exposure to colds. Little cough. One episode of streaky hemoptysis in April. Denies sweats, fever, adenopathy, chest pain, palpitation.  03/14/12- 55 yo F never smoker with hx bronchitis and chronic recurrent hemoptysis, ? MAIC by CT. PCP Dr Jonathon Jordan FOLLOWS FOR: cough at times-slightly productive-green in color. Denies any wheezing,SOB, or congestion. History of recurrent bronchitis and pneumonias. PFT 2012-air trapping. a1AT 08/08/10- Nl MM CXR 09/11/11-reviewed. Significant chronic scarring. IMPRESSION:  No active disease. Stable hyperinflation and chronic interstitial  prominence.  Original Report Authenticated By: Lahoma Crocker, M.D.  09/12/12-71 yo F never smoker with hx bronchitis and chronic recurrent hemoptysis, ? MAIC by CT. PCP Dr Jonathon Jordan FOLLOWS FOR:08-20-12 got sick while at beach and went to [facility] on 08-22-12. CXR done-disc brought today. Had felt better for a while afterwards up until this week-not  as bad as july 4th visit; runny nose, cough, chest congestion, back hurts (upper) form coughing so much. Was given Tessalon Rx for cough by Dr Paulino Rily on July 8th. Acute  infections in March, July and again in last 4 days starting like colds, with low grade fever, malaise, head then chest congestion. Little fever/ chill. No blood and no GI. We reviewed images of CXR from July 4 disk showing thin chest, bronchiectatic type scarring.  Managing as AECOPD but will give doxy, try for sputum cx, check immunoglobulins. Consider Daliresp nex time.  CT chest - 03/18/12-  IMPRESSION:  No significant radiographic change in the chest since July 2013.  Peribronchial thickening bilaterally with slight reticulonodular  pattern in the right lung and bilateral areas of bronchiectasis.  Given findings on the chest CT of June 2012, the possibility of a  chronic persistent Mycobacterium avium complex infection cannot be  excluded.  Original Report Authenticated By: Britta Mccreedy, M.D.   Review of Systems-see HPI Constitutional:   No-   weight loss, night sweats, fevers, chills, fatigue, lassitude. HEENT:   No-  headaches, difficulty swallowing, tooth/dental problems, +sore throat,       + sneezing, no-itching, ear ache, +nasal congestion, +post nasal drip,  CV:  No-   chest pain, orthopnea, PND, swelling in lower extremities, anasarca, dizziness, palpitations Resp: +  shortness of breath with exertion or at rest.              No-  productive cough,  + non-productive cough,  No- recent coughing up of blood.              No-   change in color of mucus.  No- wheezing.   Skin: No-   rash or lesions. GI:  No-   heartburn, indigestion, abdominal pain, nausea, vomiting,  GU:  MS:  No-   joint pain or swelling.  . Neuro-     nothing unusual Psych:  No- change in mood or affect. No depression or anxiety.  No memory loss.  Objective:   Physical Exam General- Alert, Oriented, Affect-cheerful, Distress- none acute. Thin. Skin- rash-none, lesions- none, excoriation- none Lymphadenopathy- none Head- atraumatic            Eyes- Gross vision intact, PERRLA, conjunctivae clear  secretions            Ears- Hearing, canals-normal            Nose- Clear, no-Septal dev, mucus, polyps, erosion, perforation             Throat- Mallampati II , mucosa clear , drainage- none, tonsils- atrophic Neck- flexible , trachea midline, no stridor , thyroid nl, carotid no bruit Chest - symmetrical excursion , unlabored           Heart/CV- RRR , no murmur , no gallop  , no rub, nl s1 s2                           - JVD- none , edema- none, stasis changes- none, varices- none           Lung- +deep breath triggers few rhonchi, +dry cough , dullness-none, rub- none           Chest wall-  Abd-  Br/ Gen/ Rectal- Not done, not indicated Extrem- cyanosis- none, clubbing, none, atrophy- none, strength- nl.  Neuro- grossly intact to observation

## 2012-09-12 NOTE — Patient Instructions (Addendum)
Script doxycycline antibiotic sent  Neb xop   Order- Sputum for C&S, AFB smear and culture               Lab - CBC, Immunoglobulins IgG, IgM, IgM  Order- CT chest, non-contrast, high resolution    Dx bronchiectasis

## 2012-09-13 LAB — IGG, IGA, IGM: IgG (Immunoglobin G), Serum: 1320 mg/dL (ref 690–1700)

## 2012-09-15 LAB — RESPIRATORY CULTURE OR RESPIRATORY AND SPUTUM CULTURE: Organism ID, Bacteria: NORMAL

## 2012-09-16 NOTE — Progress Notes (Signed)
Quick Note:  LMTCB ______ 

## 2012-09-18 ENCOUNTER — Other Ambulatory Visit: Payer: Self-pay | Admitting: Internal Medicine

## 2012-09-18 NOTE — Telephone Encounter (Signed)
Ok to refill doxy this time

## 2012-09-18 NOTE — Telephone Encounter (Signed)
Please advise if okay to refill; was given on 09-12-12 at Office Visit. Thanks.

## 2012-09-19 NOTE — Telephone Encounter (Signed)
Rx has been sent in per CY. 

## 2012-09-26 NOTE — Progress Notes (Signed)
Quick Note:  LMTCB ______ 

## 2012-09-27 NOTE — Assessment & Plan Note (Signed)
Recurrent acute exacerbation sound like flares of URI with bronchitis, responsive to antibiotics and prednisone. Usually there is little sputum and not much fever. Plan- check immunoglobulins, give doxycycline and culture sputum, chest CT for bronchiectasis status questioning progression. Consider Daliresp

## 2012-09-29 ENCOUNTER — Telehealth: Payer: Self-pay | Admitting: Internal Medicine

## 2012-09-29 NOTE — Telephone Encounter (Signed)
Notes Recorded by Waymon Budge, MD on 09/12/2012 at 4:52 PM CT shows changes of COPD/ chronic bronchitis. Some of these changes could be a little progressive, as can be seen with extremely slow growing infection. We can wait for sputum culture to see if anything grows out over next several weeks, and we will talk again at next ov. -----  LMTCB x1 for pt

## 2012-09-30 NOTE — Telephone Encounter (Signed)
Pt is returning triage's call & can be reached at [phone].  Antionette Fairy

## 2012-09-30 NOTE — Telephone Encounter (Signed)
Pt advised. Jennifer Castillo, CMA  

## 2012-09-30 NOTE — Telephone Encounter (Signed)
LMTCBx2. Jennifer Castillo, CMA  

## 2012-10-20 LAB — AFB CULTURE WITH SMEAR (NOT AT ARMC): Acid Fast Smear: NONE SEEN

## 2012-11-03 ENCOUNTER — Telehealth: Payer: Self-pay | Admitting: Internal Medicine

## 2012-11-03 NOTE — Telephone Encounter (Signed)
lmomtcb x1 for pt 

## 2012-11-04 MED ORDER — DOXYCYCLINE HYCLATE 100 MG PO TABS: ORAL_TABLET | ORAL | Status: DC

## 2012-11-04 NOTE — Telephone Encounter (Signed)
Pt returned call. [name] °

## 2012-11-04 NOTE — Telephone Encounter (Signed)
Spoke with pt and advised that rx for Doxycycline was sent to pharmacy and we will cancel appt on Friday and call us on Monday or Tuesday of next week if no better.

## 2012-11-04 NOTE — Telephone Encounter (Signed)
Ok to order doxycycline 10 mg, # 8, 2 today then one daily

## 2012-11-04 NOTE — Telephone Encounter (Signed)
Pt c/o prod cough (yellow) for several days.  Using Tessalon perles.  Sinus congestion, runny nose (Clear- yellow), sneezing.  Has appt Friday 9/19 but doesn't know if she can wait till then.  Please advise.  [facility] Allergies  Allergen Reactions  . Augmentin [Amoxicillin-Pot Clavulanate] Nausea And Vomiting  . Black Cohosh Nausea And Vomiting    Severe GI Upset, sweats  . Codeine Nausea And Vomiting    Severe GI upset, sweats  . Hyoscyamine     Cramps, made urinary symptoms worse  . Oxybutynin Chloride     Cramps, made urinary symptoms worse

## 2012-11-07 ENCOUNTER — Ambulatory Visit: Payer: Medicare Other | Admitting: Internal Medicine

## 2012-11-10 ENCOUNTER — Telehealth: Payer: Self-pay | Admitting: Internal Medicine

## 2012-11-10 NOTE — Telephone Encounter (Signed)
Called pt at # provided above, went directly to VM.  lmomtcb

## 2012-11-11 NOTE — Telephone Encounter (Signed)
Called spoke with patient.  She stated that she still has some congestion in her lungs and runny nose, sometimes clear- to - yellow mucus, some PND.  Prod cough with dark yellow mucus.  Does feel better, "but not well yet."  Denies wheezing, chest tightness, increased SOB, nausea, vomiting, hemoptysis.  Finished doxycycline yesterday.  Last ov 7.25.14, follow up in 6 weeks.  Pt is due for appt, but no openings with CY.  Dr Maple Hudson please advise, thank you. [facility]. Allergies  Allergen Reactions  . Augmentin [Amoxicillin-Pot Clavulanate] Nausea And Vomiting  . Black Cohosh Nausea And Vomiting    Severe GI Upset, sweats  . Codeine Nausea And Vomiting    Severe GI upset, sweats  . Hyoscyamine     Cramps, made urinary symptoms worse  . Oxybutynin Chloride     Cramps, made urinary symptoms worse

## 2012-11-11 NOTE — Telephone Encounter (Signed)
Pt returned triage's call & can be reached at [phone].  [name]

## 2012-11-11 NOTE — Telephone Encounter (Signed)
ATC patient, no answer LMOMTCB 

## 2012-11-11 NOTE — Telephone Encounter (Signed)
Try repeating the doxy script, since she was responding. Work with Florentina Addison for [employer]- anytime in next month

## 2012-11-12 MED ORDER — DOXYCYCLINE HYCLATE 100 MG PO TABS: ORAL_TABLET | ORAL | Status: DC

## 2012-11-12 NOTE — Telephone Encounter (Signed)
Friday 12-12-12 at 11:00am slot. Thanks.

## 2012-11-12 NOTE — Telephone Encounter (Signed)
Spoke with the pt and appt was scheduled  

## 2012-11-12 NOTE — Telephone Encounter (Signed)
Spoke with the pt and notified of recs per CDY She verbalized understanding Rx for doxy sent  Katie, please advise on when we can work the pt in to see CDY in the next month, thanks

## 2012-12-12 ENCOUNTER — Encounter: Payer: Self-pay | Admitting: Internal Medicine

## 2012-12-12 ENCOUNTER — Ambulatory Visit (INDEPENDENT_AMBULATORY_CARE_PROVIDER_SITE_OTHER): Payer: Medicare Other | Admitting: Internal Medicine

## 2012-12-12 VITALS — BP 150/90 | HR 71 | Ht 67.0 in | Wt 134.0 lb

## 2012-12-12 DIAGNOSIS — J449 Chronic obstructive pulmonary disease, unspecified: Secondary | ICD-10-CM

## 2012-12-12 DIAGNOSIS — J479 Bronchiectasis, uncomplicated: Secondary | ICD-10-CM

## 2012-12-12 NOTE — Progress Notes (Signed)
Patient ID: [name], female    DOB: 10-13-1940, 72 y.o.   MRN: [id number]  HPI 08/01/10- 72 yo F never smoker with hx bronchitis and chronic recurrent hemoptysis. Referred courtesy of Dr Jonathon Jordan. Has noted blood from mouth, mostly at night after she has been asleep, intermittently since at least 2003. Previous ENT eval by Dr Edison Nasuti, Bronchoscopy by Dr Nils Pyle as well as pulmonary evaluation by Dr Gwenette Greet. No source found. She put herself on aspirin as a self-imposed health measure in April and noted onset of bleeding again about a month after that. Self-limited then and no recurrence since.. Denies fever, sweat, nodes, weight loss or chest pain.  CT chest 08/20/03 - advanced COPD with bronchiectasis and blebs.  Lived in [place] for 5 months in 2005- bleeding preceded that trip. Remote pneumonia. PPD negative.  08/30/10- 72 yo F never smoker with hx bronchitis and chronic recurrent hemoptysis. CT 08/01/10- suggests MAIC with scattered nodularity and bronchiectasis. Some improvement since 12/09/07, but some new areas. Images reviewed with her.  She denies any change. Coughs a little, intermittently on first rising, scant phlegm. Denies fever or night sweats. Weight fluctuates in her range.  No heme since last here.   03/14/11- 72 yo F never smoker with hx bronchitis and chronic recurrent hemoptysis, ? MAIC by CT. PCP Dr Jonathon Jordan She chose not to have flu shot. Describes heavy exposure to secondhand smoke while growing up. Has not had further hemoptysis. Now resolving acute bronchitis the began with sinusitis right after Christmas. Over-the-counter meds helped. January 5, bad cough [facility] Chest x-ray done, diagnosed viral bronchitis on COPD and treated with antibiotic and prednisone. She then went to [facility] to stay at her sister's house. While there she went to an urgent care in [place] on January 11 with a repeat chest x-ray and similar  medications. She seemed better but went back to urgent care at  [facility] and was sent to the emergency room. Diagnosed with pneumonia on CT scan at [facility]. Augmentin caused nausea, switched to Z-Pak with prednisone. Now off all antibiotics and prednisone. Using rescue inhaler occasionally. Still some cough with scant clear mucus and rhinorrhea. Some dyspnea on exertion on hills and stairs. No fever or sweating, chest pain or palpitation.  08/22/11- 72 yo F never smoker with hx bronchitis and chronic recurrent hemoptysis, ? MAIC by CT. PCP Dr Jonathon Jordan c/o sob with exertion otherwise doing good.  Last imaging was CT scan at [facility] January 2013. Did finally get flu vaccine in January. Does not go outside very much. Keeping a 53-year-old granddaughter and we discussed exposure to colds. Little cough. One episode of streaky hemoptysis in April. Denies sweats, fever, adenopathy, chest pain, palpitation.  03/14/12- 72 yo F never smoker with hx bronchitis and chronic recurrent hemoptysis, ? MAIC by CT. PCP Dr Jonathon Jordan FOLLOWS FOR: cough at times-slightly productive-green in color. Denies any wheezing,SOB, or congestion. History of recurrent bronchitis and pneumonias. PFT 2012-air trapping. a1AT 08/08/10- Nl MM CXR 09/11/11-reviewed. Significant chronic scarring. IMPRESSION:  No active disease. Stable hyperinflation and chronic interstitial  prominence.  Original Report Authenticated By: Lahoma Crocker, M.D.  09/12/12-72 yo F never smoker with hx bronchitis and chronic recurrent hemoptysis, ? MAIC by CT. PCP Dr Jonathon Jordan FOLLOWS FOR:08-20-12 got sick while at beach and went to [facility] on 08-22-12. CXR done-disc brought today. Had felt better for a while afterwards up until this week-not  as bad as july 4th visit; runny nose, cough, chest congestion, back hurts (upper) form coughing so much. Was given Tessalon Rx for cough by Dr Paulino Rily on July 8th. Acute  infections in March, July and again in last 4 days starting like colds, with low grade fever, malaise, head then chest congestion. Little fever/ chill. No blood and no GI. We reviewed images of CXR from July 4 disk showing thin chest, bronchiectatic type scarring.  Managing as AECOPD but will give doxy, try for sputum cx, check immunoglobulins. Consider Daliresp nex time.  CT chest - 03/18/12-  IMPRESSION:  No significant radiographic change in the chest since July 2013.  Peribronchial thickening bilaterally with slight reticulonodular  pattern in the right lung and bilateral areas of bronchiectasis.  Given findings on the chest CT of June 2012, the possibility of a  chronic persistent Mycobacterium avium complex infection cannot be  excluded.  Original Report Authenticated By: Britta Mccreedy, M.D.  12/12/12- 72 yo F never smoker with hx bronchitis and chronic recurrent hemoptysis, + MAIC. PCP Dr Mila Palmer SPUTUM 09/12/12 POS MAIC FOLLOWS FOR:  Breathing is unchanged.  At times experiencing wheezing DOE with brisk walk/ on hills. No routine cough. Last antibiotic slowly cleared acute bronchitis after last visit. Denies sweats, fever, nodes. Has had hemoptysis off and on in the past attributed to chronic bronchitis. Immunoglobulins were okay     She wants to defer treatment for [facility], following for now. CT 09/12/12 IMPRESSION:  1. 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 The disease appears slightly  progressed compared to the prior study, as discussed above.  2. Extensive air trapping in the lungs bilaterally.  3. No findings to suggest an interstitial lung disease at this  time.  4. Atherosclerosis, including left anterior descending coronary  artery disease.  Original Report Authenticated By: Trudie Reed, M.D.  Review of Systems-see HPI Constitutional:   No-    weight loss, night sweats, fevers, chills, fatigue, lassitude. HEENT:   No-  headaches, difficulty swallowing, tooth/dental problems, sore throat,       No- sneezing, no-itching, ear ache, +nasal congestion, post nasal drip,  CV:  No-   chest pain, orthopnea, PND, swelling in lower extremities, anasarca, dizziness, palpitations Resp: +  shortness of breath with exertion or at rest.              No-  productive cough,  + non-productive cough,  No- recent coughing up of blood.              No-   change in color of mucus.  No- wheezing.   Skin: No-   rash or lesions. GI:  No-   heartburn, indigestion, abdominal pain, nausea, vomiting,  GU:  MS:  No-   joint pain or swelling.  . Neuro-     nothing unusual Psych:  No- change in mood or affect. No depression or anxiety.  No memory loss.  Objective:   Physical Exam General- Alert, Oriented, Affect-cheerful, Distress- none acute. Thin. Skin- rash-none, lesions- none, excoriation- none Lymphadenopathy- none Head- atraumatic            Eyes- Gross vision intact, PERRLA, conjunctivae clear secretions            Ears- Hearing, canals-normal            Nose- Clear, no-Septal dev, mucus, polyps, erosion, perforation  Throat- Mallampati II , mucosa clear , drainage- none, tonsils- atrophic Neck- flexible , trachea midline, no stridor , thyroid nl, carotid no bruit Chest - symmetrical excursion , unlabored           Heart/CV- RRR/ occ extra beat , no murmur , no gallop  , no rub, nl s1 s2                           - JVD- none , edema- none, stasis changes- none, varices- none           Lung- +few crackles especially on left, cough- none , dullness-none, rub- none           Chest wall-  Abd-  Br/ Gen/ Rectal- Not done, not indicated Extrem- cyanosis- none, clubbing, none, atrophy- none, strength- nl.  Neuro- grossly intact to observation

## 2012-12-12 NOTE — Patient Instructions (Signed)
We discussed your sputum culture growing Mycobacterium avium-intracellulare (MAIC), "atypical AFB"  Order- future CXR to be done at next visit   Dx  chronic bronchitis due to [facility]

## 2012-12-28 NOTE — Assessment & Plan Note (Signed)
We discussed treatment, whether to treat, symptoms. She wants to wait before starting treatment for [facility] Plan-return in December for chest x-ray

## 2013-01-20 ENCOUNTER — Telehealth: Payer: Self-pay | Admitting: Internal Medicine

## 2013-01-20 MED ORDER — DOXYCYCLINE HYCLATE 100 MG PO TABS: ORAL_TABLET | ORAL | Status: DC

## 2013-01-20 NOTE — Telephone Encounter (Signed)
Pt c/o runny nose (clear), head congestion, sneezing occas prod cough (yellow) for past 2 days.  Denies fever, headaches, facial pain.  Pt has not tried any otc antihistamine, decongestant.   Last ov: 12/12/12 Allergies  Allergen Reactions  . Augmentin [Amoxicillin-Pot Clavulanate] Nausea And Vomiting  . Black Cohosh Nausea And Vomiting    Severe GI Upset, sweats  . Codeine Nausea And Vomiting    Severe GI upset, sweats  . Hyoscyamine     Cramps, made urinary symptoms worse  . Oxybutynin Chloride     Cramps, made urinary symptoms worse

## 2013-01-20 NOTE — Telephone Encounter (Signed)
Pt is aware of CY recs. Rx has been sent in. 

## 2013-01-20 NOTE — Telephone Encounter (Signed)
Per CY-Doxycycline 100 mg #8 take 2 today then 1 daily no refills.

## 2013-01-27 ENCOUNTER — Other Ambulatory Visit: Payer: Self-pay | Admitting: Internal Medicine

## 2013-01-28 NOTE — Telephone Encounter (Signed)
Please advise if okay to refill. Thanks.  

## 2013-01-29 ENCOUNTER — Telehealth: Payer: Self-pay | Admitting: Internal Medicine

## 2013-01-29 MED ORDER — DOXYCYCLINE HYCLATE 100 MG PO TABS: ORAL_TABLET | ORAL | Status: DC

## 2013-01-29 NOTE — Telephone Encounter (Signed)
Ok to refill last script for doxycycline

## 2013-01-29 NOTE — Telephone Encounter (Signed)
lmtcb x1 

## 2013-01-29 NOTE — Telephone Encounter (Signed)
Pt is aware that rx will be sent in.

## 2013-01-29 NOTE — Telephone Encounter (Signed)
Ok to refill doxycycline 

## 2013-01-29 NOTE — Telephone Encounter (Signed)
Pt returned triage's call & can be reached at [phone].  [name]

## 2013-01-29 NOTE — Telephone Encounter (Signed)
Spoke to pt. States she needs a refill on Doxy. Reports cough with production of little yellow mucus, sneezing and congestion. Denies fever/chills or sweats.  Allergies  Allergen Reactions  . Augmentin [Amoxicillin-Pot Clavulanate] Nausea And Vomiting  . Black Cohosh Nausea And Vomiting    Severe GI Upset, sweats  . Codeine Nausea And Vomiting    Severe GI upset, sweats  . Hyoscyamine     Cramps, made urinary symptoms worse  . Oxybutynin Chloride     Cramps, made urinary symptoms worse    Current Outpatient Prescriptions on File Prior to Visit  Medication Sig Dispense Refill  . ALPRAZolam (XANAX) 0.5 MG tablet Take 0.5 mg by mouth at bedtime as needed.        . Cholecalciferol 1000 UNITS tablet Take 1,000 Units by mouth daily.       . citalopram (CELEXA) 20 MG tablet Take 20 mg by mouth daily.        Marland Kitchen doxycycline (VIBRA-TABS) 100 MG tablet Take 2 tablets today, then one daily until gone  8 tablet  0  . estradiol (ESTRACE) 0.5 MG tablet Take 0.5 mg by mouth daily.        . ferrous gluconate (FERGON) 246 (28 FE) MG tablet Take 246 mg by mouth daily with breakfast.        . Fluticasone-Salmeterol (ADVAIR) 250-50 MCG/DOSE AEPB Inhale 1 puff into the lungs every 12 (twelve) hours.      Marland Kitchen levothyroxine (SYNTHROID, LEVOTHROID) 88 MCG tablet Take 88 mcg by mouth daily.        . meclizine (ANTIVERT) 25 MG tablet Take 25 mg by mouth daily as needed.        . Multiple Minerals (CALCIUM/MAGNESIUM/ZINC) TABS Take 3 tablets by mouth at bedtime. 1800mg       . Multiple Vitamin (MULTIVITAMIN) tablet Take 1 tablet by mouth daily.        . Naproxen Sodium (ALEVE) 220 MG CAPS Take by mouth. Take as directed prn      . Omega-3 Fatty Acids (FISH OIL) 1000 MG CAPS Take 1 capsule by mouth daily.        . vitamin C (ASCORBIC ACID) 500 MG tablet Take 500 mg by mouth daily.         No current facility-administered medications on file prior to visit.    CY - please advise. Thanks.

## 2013-02-10 ENCOUNTER — Other Ambulatory Visit: Payer: Self-pay | Admitting: Internal Medicine

## 2013-02-10 ENCOUNTER — Encounter: Payer: Self-pay | Admitting: Internal Medicine

## 2013-02-10 ENCOUNTER — Ambulatory Visit (INDEPENDENT_AMBULATORY_CARE_PROVIDER_SITE_OTHER)
Admission: RE | Admit: 2013-02-10 | Discharge: 2013-02-10 | Disposition: A | Discharge status: Home / Self Care | Payer: Medicare Other | Source: Ambulatory Visit | Attending: Internal Medicine | Admitting: Internal Medicine

## 2013-02-10 ENCOUNTER — Ambulatory Visit (INDEPENDENT_AMBULATORY_CARE_PROVIDER_SITE_OTHER): Payer: Medicare Other | Admitting: Internal Medicine

## 2013-02-10 ENCOUNTER — Other Ambulatory Visit (INDEPENDENT_AMBULATORY_CARE_PROVIDER_SITE_OTHER): Payer: Medicare Other

## 2013-02-10 VITALS — BP 114/66 | HR 87 | Ht 67.0 in | Wt 133.6 lb

## 2013-02-10 DIAGNOSIS — J42 Unspecified chronic bronchitis: Secondary | ICD-10-CM

## 2013-02-10 DIAGNOSIS — J479 Bronchiectasis, uncomplicated: Secondary | ICD-10-CM

## 2013-02-10 DIAGNOSIS — A310 Pulmonary mycobacterial infection: Secondary | ICD-10-CM

## 2013-02-10 DIAGNOSIS — A318 Other mycobacterial infections: Secondary | ICD-10-CM

## 2013-02-10 LAB — COMPREHENSIVE METABOLIC PANEL
Alkaline Phosphatase: 88 U/L (ref 39–117)
BUN: 13 mg/dL (ref 6–23)
Glucose, Bld: 81 mg/dL (ref 70–99)
Sodium: 137 mEq/L (ref 135–145)
Total Bilirubin: 0.6 mg/dL (ref 0.3–1.2)

## 2013-02-10 IMAGING — CR DG CHEST 2V
2 series · 2 of 2 positions shown · non-contrast
Comparison: [DATE]

CLINICAL DATA: Bronchitis for 3 weeks, COPD, nonsmoker, history JOANNA
complex

EXAM:
CHEST  2 VIEW

[view not recorded (1 of 2)]
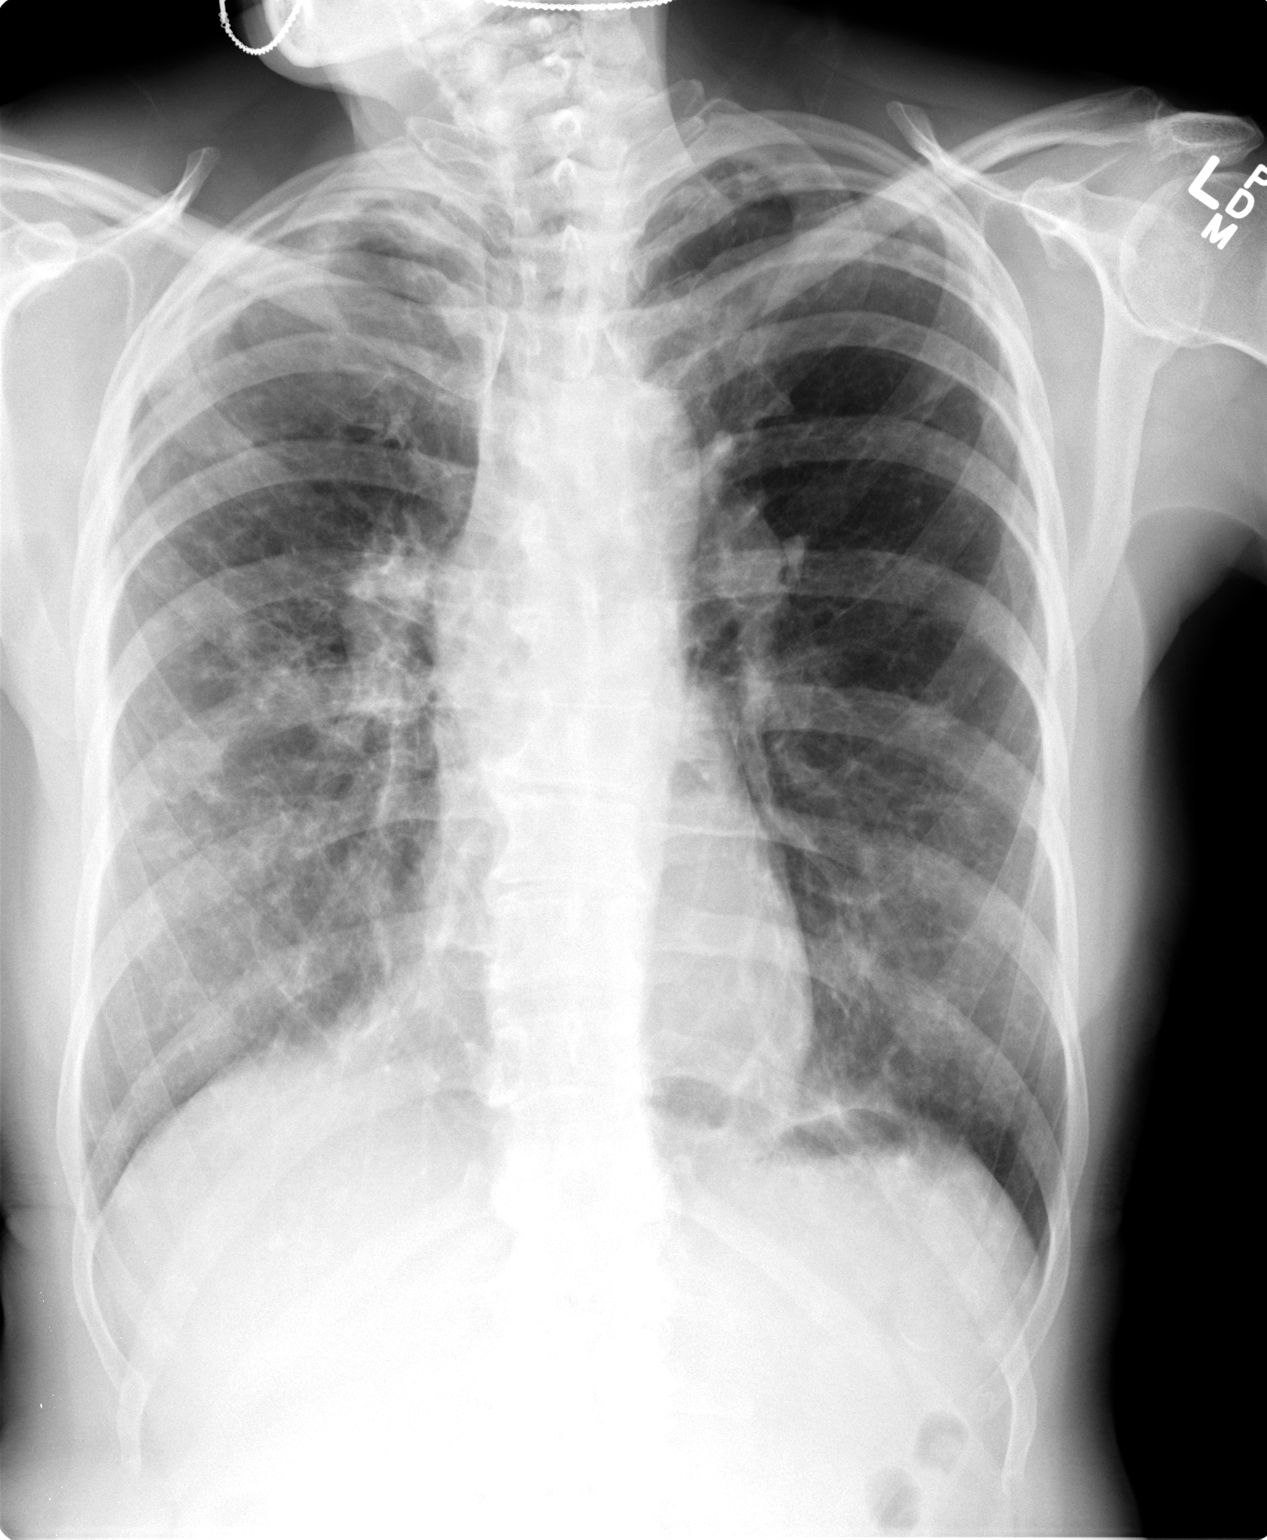

[view not recorded (2 of 2)]
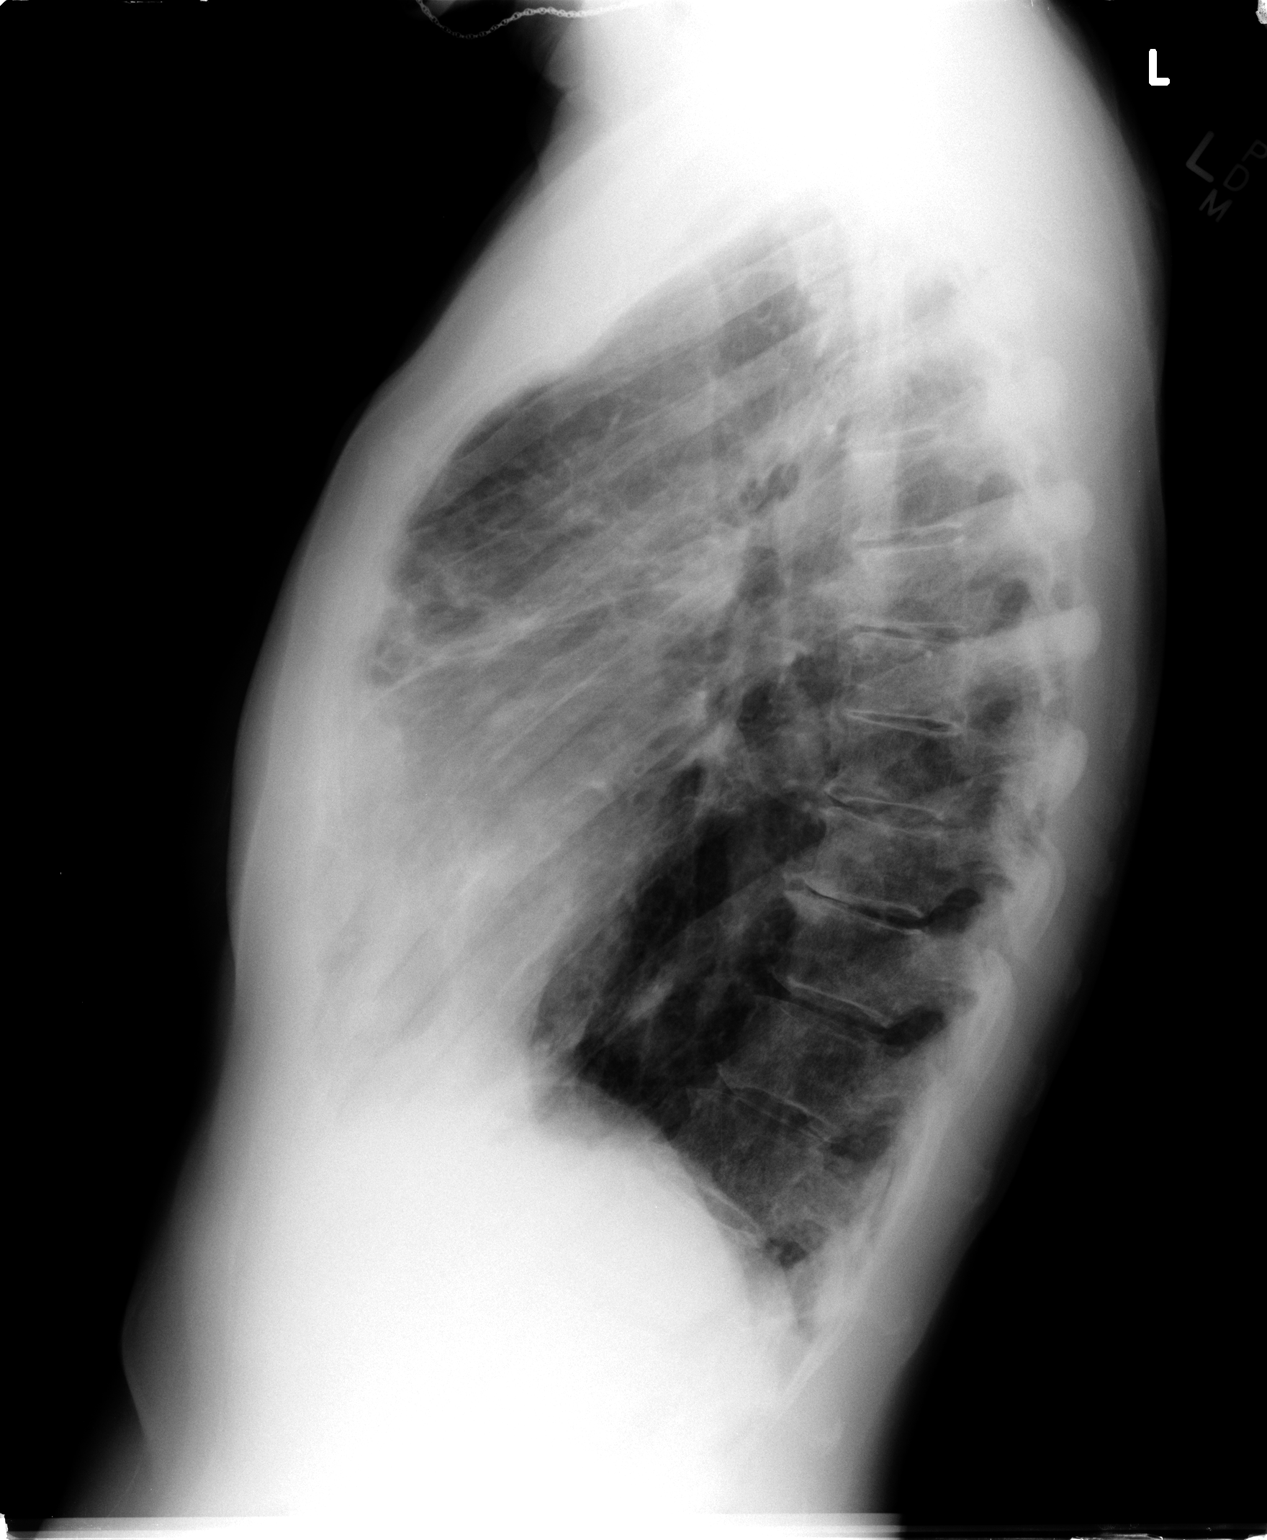

[2 of 2 positions shown; findings below may reference images not displayed]

FINDINGS: Normal heart size, mediastinal contours, and pulmonary vascularity.

Chronic interstitial infiltrates in right lung unchanged.

Underlying emphysematous and chronic bronchitic changes.

No definite acute infiltrate, pleural effusion or pneumothorax.

Mild biapical scarring.

Minimal biconvex thoracic scoliosis.
IMPRESSION: Chronic infiltrates in right lung question related to chronic JOANNA
infection versus scarring.

Underlying emphysema/chronic bronchitic changes with biapical
scarring.

No acute abnormalities.

## 2013-02-10 MED ORDER — ETHAMBUTOL HCL 400 MG PO TABS: ORAL_TABLET | ORAL | Status: DC

## 2013-02-10 MED ORDER — RIFABUTIN 150 MG PO CAPS: ORAL_CAPSULE | ORAL | Status: DC

## 2013-02-10 MED ORDER — AZITHROMYCIN 250 MG PO TABS: ORAL_TABLET | ORAL | Status: DC

## 2013-02-10 NOTE — Patient Instructions (Signed)
We are starting long-term treatment for your Mycobacterium avium-intracellulare (MAIC, atypical AFB) with: zithromax- might interact with citalopram to affect heart rythm Ethambutol- can bother color vision- discuss with your eye doctor Rifabutin- can bother livers- we will check liver function with lab test  The plan is to take these as directed for at least several months, three days each week- such as Monday, Wednesday, Friday. Mark a calendar.  To avoid queasy stomach you may want to take after a meal.   Order- lab CMET    Dx chronic bronchitis

## 2013-02-10 NOTE — Progress Notes (Signed)
Patient ID: [name], female    DOB: 10-13-1940, 72 y.o.   MRN: [id number]  HPI 08/01/10- 72 yo F never smoker with hx bronchitis and chronic recurrent hemoptysis. Referred courtesy of Dr Jonathon Jordan. Has noted blood from mouth, mostly at night after she has been asleep, intermittently since at least 2003. Previous ENT eval by Dr Edison Nasuti, Bronchoscopy by Dr Nils Pyle as well as pulmonary evaluation by Dr Gwenette Greet. No source found. She put herself on aspirin as a self-imposed health measure in April and noted onset of bleeding again about a month after that. Self-limited then and no recurrence since.. Denies fever, sweat, nodes, weight loss or chest pain.  CT chest 08/20/03 - advanced COPD with bronchiectasis and blebs.  Lived in [place] for 5 months in 2005- bleeding preceded that trip. Remote pneumonia. PPD negative.  08/30/10- 72 yo F never smoker with hx bronchitis and chronic recurrent hemoptysis. CT 08/01/10- suggests MAIC with scattered nodularity and bronchiectasis. Some improvement since 12/09/07, but some new areas. Images reviewed with her.  She denies any change. Coughs a little, intermittently on first rising, scant phlegm. Denies fever or night sweats. Weight fluctuates in her range.  No heme since last here.   03/14/11- 72 yo F never smoker with hx bronchitis and chronic recurrent hemoptysis, ? MAIC by CT. PCP Dr Jonathon Jordan She chose not to have flu shot. Describes heavy exposure to secondhand smoke while growing up. Has not had further hemoptysis. Now resolving acute bronchitis the began with sinusitis right after Christmas. Over-the-counter meds helped. January 5, bad cough [facility] Chest x-ray done, diagnosed viral bronchitis on COPD and treated with antibiotic and prednisone. She then went to [facility] to stay at her sister's house. While there she went to an urgent care in [place] on January 11 with a repeat chest x-ray and similar  medications. She seemed better but went back to urgent care at  [facility] and was sent to the emergency room. Diagnosed with pneumonia on CT scan at [facility]. Augmentin caused nausea, switched to Z-Pak with prednisone. Now off all antibiotics and prednisone. Using rescue inhaler occasionally. Still some cough with scant clear mucus and rhinorrhea. Some dyspnea on exertion on hills and stairs. No fever or sweating, chest pain or palpitation.  08/22/11- 72 yo F never smoker with hx bronchitis and chronic recurrent hemoptysis, ? MAIC by CT. PCP Dr Jonathon Jordan c/o sob with exertion otherwise doing good.  Last imaging was CT scan at [facility] January 2013. Did finally get flu vaccine in January. Does not go outside very much. Keeping a 53-year-old granddaughter and we discussed exposure to colds. Little cough. One episode of streaky hemoptysis in April. Denies sweats, fever, adenopathy, chest pain, palpitation.  03/14/12- 72 yo F never smoker with hx bronchitis and chronic recurrent hemoptysis, ? MAIC by CT. PCP Dr Jonathon Jordan FOLLOWS FOR: cough at times-slightly productive-green in color. Denies any wheezing,SOB, or congestion. History of recurrent bronchitis and pneumonias. PFT 2012-air trapping. a1AT 08/08/10- Nl MM CXR 09/11/11-reviewed. Significant chronic scarring. IMPRESSION:  No active disease. Stable hyperinflation and chronic interstitial  prominence.  Original Report Authenticated By: Lahoma Crocker, M.D.  09/12/12-71 yo F never smoker with hx bronchitis and chronic recurrent hemoptysis, ? MAIC by CT. PCP Dr Jonathon Jordan FOLLOWS FOR:08-20-12 got sick while at beach and went to [facility] on 08-22-12. CXR done-disc brought today. Had felt better for a while afterwards up until this week-not  as bad as july 4th visit; runny nose, cough, chest congestion, back hurts (upper) form coughing so much. Was given Tessalon Rx for cough by Dr Paulino Rily on July 8th. Acute  infections in March, July and again in last 4 days starting like colds, with low grade fever, malaise, head then chest congestion. Little fever/ chill. No blood and no GI. We reviewed images of CXR from July 4 disk showing thin chest, bronchiectatic type scarring.  Managing as AECOPD but will give doxy, try for sputum cx, check immunoglobulins. Consider Daliresp nex time.  CT chest - 03/18/12-  IMPRESSION:  No significant radiographic change in the chest since July 2013.  Peribronchial thickening bilaterally with slight reticulonodular  pattern in the right lung and bilateral areas of bronchiectasis.  Given findings on the chest CT of June 2012, the possibility of a  chronic persistent Mycobacterium avium complex infection cannot be  excluded.  Original Report Authenticated By: Britta Mccreedy, M.D.  12/12/12- 72 yo F never smoker with hx bronchitis and chronic recurrent hemoptysis, + MAIC. PCP Dr Mila Palmer SPUTUM 09/12/12 POS MAIC FOLLOWS FOR:  Breathing is unchanged.  At times experiencing wheezing DOE with brisk walk/ on hills. No routine cough. Last antibiotic slowly cleared acute bronchitis after last visit. Denies sweats, fever, nodes. Has had hemoptysis off and on in the past attributed to chronic bronchitis. Immunoglobulins were okay     She wants to defer treatment for [facility], following for now. CT 09/12/12 IMPRESSION:  1. 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 The disease appears slightly  progressed compared to the prior study, as discussed above.  2. Extensive air trapping in the lungs bilaterally.  3. No findings to suggest an interstitial lung disease at this  time.  4. Atherosclerosis, including left anterior descending coronary  artery disease.  Original Report Authenticated By: Trudie Reed, M.D.  02/10/13- 72 yo F never smoker with hx bronchitis and  chronic recurrent hemoptysis, + MAIC 09/12/12. Rx: 02/10/13- zith 500 mg TIW, EMB 1200 TIW, Rif 300 mg TIW PCP Dr Mila Palmer Much cough but it not as much yellow.  Produces sputum once or twice a day. Feels okay and denies fever, night sweats or blood recently. Using Tessalon Perles up to 3 daily. CXR 02/10/13-  IMPRESSION:  Chronic infiltrates in right lung question related to chronic MAI  infection versus scarring.  Underlying emphysema/chronic bronchitic changes with biapical  scarring.  No acute abnormalities.  Electronically Signed  By: Ulyses Southward M.D.  On: 02/10/2013 09:33   Review of Systems-see HPI Constitutional:   No-   weight loss, night sweats, fevers, chills, fatigue, lassitude. HEENT:   No-  headaches, difficulty swallowing, tooth/dental problems, sore throat,       No- sneezing, no-itching, ear ache, +nasal congestion, post nasal drip,  CV:  No-   chest pain, orthopnea, PND, swelling in lower extremities, anasarca, dizziness, palpitations Resp: +  shortness of breath with exertion or at rest.            +  productive cough,  + non-productive cough,  No- recent coughing up of blood.              No-   change in color of mucus.  No- wheezing.   Skin: No-   rash or lesions. GI:  No-   heartburn, indigestion, abdominal pain, nausea, vomiting,  GU:  MS:  No-  joint pain or swelling.  . Neuro-     nothing unusual Psych:  No- change in mood or affect. No depression or anxiety.  No memory loss.  Objective:   Physical Exam General- Alert, Oriented, Affect-cheerful, Distress- none acute. Thin. Skin- rash-none, lesions- none, excoriation- none Lymphadenopathy- none Head- atraumatic            Eyes- Gross vision intact, PERRLA, conjunctivae clear secretions            Ears- Hearing, canals-normal            Nose- Clear, no-Septal dev, mucus, polyps, erosion, perforation             Throat- Mallampati II , mucosa clear , drainage- none, tonsils- atrophic Neck- flexible  , trachea midline, no stridor , thyroid nl, carotid no bruit Chest - symmetrical excursion , unlabored           Heart/CV- RRR/ occ extra beat , no murmur , no gallop  , no rub, nl s1 s2                           - JVD- none , edema- none, stasis changes- none, varices- none           Lung- +crackles, cough+dry , dullness-none, rub- none           Chest wall-  Abd-  Br/ Gen/ Rectal- Not done, not indicated Extrem- cyanosis- none, clubbing, none, atrophy- none, strength- nl.  Neuro- grossly intact to observation

## 2013-02-11 ENCOUNTER — Telehealth: Payer: Self-pay | Admitting: Internal Medicine

## 2013-02-11 ENCOUNTER — Encounter: Payer: Self-pay | Admitting: Internal Medicine

## 2013-02-11 NOTE — Telephone Encounter (Signed)
Advised pt of lab result per CDY.  Pt verbalized understanding.

## 2013-03-08 ENCOUNTER — Encounter: Payer: Self-pay | Admitting: Internal Medicine

## 2013-03-08 NOTE — Assessment & Plan Note (Signed)
After x-rays suggested some progression and with persistence of cough, she has decided to accept a trial of treatment for her [facility]. Medications were carefully discussed. She has considerable chronic bronchitis. Plan-begin triple therapy: Rx: 02/10/13- zith 500 mg TIW, EMB 1200 TIW, Rif 300 mg TIW

## 2013-03-23 ENCOUNTER — Telehealth: Payer: Self-pay | Admitting: Internal Medicine

## 2013-03-23 DIAGNOSIS — A310 Pulmonary mycobacterial infection: Secondary | ICD-10-CM

## 2013-03-23 NOTE — Telephone Encounter (Signed)
[id number] x1 

## 2013-03-24 NOTE — Telephone Encounter (Signed)
Pt is aware of recs. Referral placed. Nothing further needed 

## 2013-03-24 NOTE — Telephone Encounter (Signed)
Per Cy-lets refer her to Infectious Disease for [facility]. Thanks.

## 2013-03-24 NOTE — Telephone Encounter (Signed)
Returning call can be reached at [phone].Elnita Maxwell

## 2013-03-24 NOTE — Telephone Encounter (Signed)
Called and spoke with pt. She reports she changed insurances and the cost of azithromycin, myambutol and mycobutin was $316. She can't afford this. She wants to know if Dr. Annamaria Boots has an alternative to these medications. Please advise thanks  Allergies  Allergen Reactions  . Augmentin [Amoxicillin-Pot Clavulanate] Nausea And Vomiting  . Black Cohosh Nausea And Vomiting    Severe GI Upset, sweats  . Codeine Nausea And Vomiting    Severe GI upset, sweats  . Hyoscyamine     Cramps, made urinary symptoms worse  . Oxybutynin Chloride     Cramps, made urinary symptoms worse

## 2013-03-30 ENCOUNTER — Ambulatory Visit: Payer: Medicare HMO | Admitting: Infectious Diseases

## 2013-04-13 ENCOUNTER — Other Ambulatory Visit: Payer: Self-pay | Admitting: *Deleted

## 2013-04-13 ENCOUNTER — Encounter: Payer: Self-pay | Admitting: Infectious Diseases

## 2013-04-13 ENCOUNTER — Ambulatory Visit (INDEPENDENT_AMBULATORY_CARE_PROVIDER_SITE_OTHER): Payer: Commercial Managed Care - HMO | Admitting: Infectious Diseases

## 2013-04-13 VITALS — BP 145/82 | HR 76 | Temp 97.6°F | Ht 67.0 in | Wt 132.0 lb

## 2013-04-13 DIAGNOSIS — A310 Pulmonary mycobacterial infection: Secondary | ICD-10-CM

## 2013-04-13 DIAGNOSIS — J479 Bronchiectasis, uncomplicated: Secondary | ICD-10-CM

## 2013-04-13 DIAGNOSIS — J329 Chronic sinusitis, unspecified: Secondary | ICD-10-CM | POA: Insufficient documentation

## 2013-04-13 MED ORDER — AZITHROMYCIN 250 MG PO TABS: ORAL_TABLET | ORAL | Status: DC

## 2013-04-13 MED ORDER — FLUTICASONE PROPIONATE 50 MCG/ACT NA SUSP: 1.0000 | Freq: Every day | NASAL | Status: DC

## 2013-04-13 MED ORDER — RIFABUTIN 150 MG PO CAPS: ORAL_CAPSULE | ORAL | Status: DC

## 2013-04-13 MED ORDER — ETHAMBUTOL HCL 400 MG PO TABS: ORAL_TABLET | ORAL | Status: DC

## 2013-04-13 NOTE — Assessment & Plan Note (Signed)
She is on appropriate therapy for her MAI. She is having difficulty affording her meds- will get her in with med- assistance here in clinic. Unfortunately, we do not have sensitivity testing to see if we can change her meds. Will see her back in 3-4 months.

## 2013-04-13 NOTE — Progress Notes (Signed)
   Subjective:    Patient ID: [name], female    DOB: 1940-07-06, 73 y.o.   MRN: [id number]  HPI 73 yo F with hx of COPD, no hx of smoking, has been followed by Dr Annamaria Boots for hemoptysis. She has been found to have MAI on sputum Cx (09-12-12). She had CT 09/12/12 :  1. 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 The disease appears slightly  progressed compared to the prior study, as discussed above.  2. Extensive air trapping in the lungs bilaterally.  3. No findings to suggest an interstitial lung disease at this  time.  4. Atherosclerosis, including left anterior descending coronary  artery disease.   On 03/06/13 she was started on  azithro 500 mg TIW, EMB 1200 TIW, Rif 300 mg TIW. Has had one episode of heavy hemoptysis since (in January). Believes she has had hemoptysis for 10 yrs. Usually when she is in bed.   No difficulty taking the anbx. Has been seen by ophtho.   FHx/Sochx reviewed.  Last mammogram- July 2014  Review of Systems  Constitutional: Negative for fever and chills.  Respiratory: Positive for cough.        DOE+, can walk up 10 steps and be winded.   Gastrointestinal: Negative for diarrhea and constipation.  Genitourinary: Negative for difficulty urinating.  Hematological: Negative for adenopathy.       Objective:   Physical Exam  Constitutional: She appears well-developed and well-nourished.  HENT:  Mouth/Throat: No oropharyngeal exudate.  Eyes: EOM are normal. Pupils are equal, round, and reactive to light.  Neck: Neck supple.  Cardiovascular: Normal rate, regular rhythm and normal heart sounds.   Pulmonary/Chest: Effort normal. She has rhonchi.  Abdominal: Soft. Bowel sounds are normal. She exhibits no distension. There is no tenderness.  Lymphadenopathy:    She has no cervical adenopathy.    She has no axillary adenopathy.         Assessment & Plan:

## 2013-04-13 NOTE — Assessment & Plan Note (Signed)
Will give her trial of flonase to see if we can abort her cycle of sinusitis, bronchitis when seasons change.

## 2013-04-14 ENCOUNTER — Ambulatory Visit: Payer: Medicare Other | Admitting: Internal Medicine

## 2013-04-15 ENCOUNTER — Telehealth: Payer: Self-pay | Admitting: *Deleted

## 2013-04-15 NOTE — Telephone Encounter (Signed)
Called and left [name] a v/m to return my call.  I can find assistance for only 1 of the medications Dr. Johnnye Sima has prescribed for her.

## 2013-04-29 ENCOUNTER — Other Ambulatory Visit: Payer: Self-pay | Admitting: *Deleted

## 2013-04-29 DIAGNOSIS — A310 Pulmonary mycobacterial infection: Secondary | ICD-10-CM

## 2013-04-29 MED ORDER — ETHAMBUTOL HCL 400 MG PO TABS: ORAL_TABLET | ORAL | Status: DC

## 2013-04-29 MED ORDER — AZITHROMYCIN 250 MG PO TABS: ORAL_TABLET | ORAL | Status: DC

## 2013-05-11 ENCOUNTER — Telehealth: Payer: Self-pay | Admitting: *Deleted

## 2013-05-11 NOTE — Telephone Encounter (Signed)
Faxed application for MYCOBUTIN to [facility] today.

## 2013-05-26 ENCOUNTER — Other Ambulatory Visit: Payer: Self-pay | Admitting: *Deleted

## 2013-05-26 ENCOUNTER — Telehealth: Payer: Self-pay | Admitting: *Deleted

## 2013-05-26 ENCOUNTER — Telehealth: Payer: Self-pay | Admitting: Internal Medicine

## 2013-05-26 DIAGNOSIS — A310 Pulmonary mycobacterial infection: Secondary | ICD-10-CM

## 2013-05-26 MED ORDER — AZITHROMYCIN 250 MG PO TABS: ORAL_TABLET | ORAL | Status: DC

## 2013-05-26 MED ORDER — ETHAMBUTOL HCL 400 MG PO TABS: ORAL_TABLET | ORAL | Status: DC

## 2013-05-26 MED ORDER — RIFABUTIN 150 MG PO CAPS: ORAL_CAPSULE | ORAL | Status: DC

## 2013-05-26 NOTE — Telephone Encounter (Addendum)
Sent azithromycin, ethambutol, rifabutin (each with 11 refills) to [facility] on [address]. Landis Gandy, RN

## 2013-05-26 NOTE — Telephone Encounter (Signed)
lmomtcb x1 for pt 

## 2013-05-26 NOTE — Telephone Encounter (Signed)
Received a call from Mrs. Gobin.  She has gotten reduced pricing on her medications through Medicare.  She wants her medications called in to [facility] on [address].  I gave the message to triage.

## 2013-05-26 NOTE — Telephone Encounter (Signed)
Pt returned call.  [name] ° °

## 2013-05-26 NOTE — Telephone Encounter (Signed)
Spoke with the pt  I advised that she should get these rxs from Dr Johnnye Sima since we referred her to him for this problem and looks like they helped her get the meds approved  She verbalized understanding and will give ID a call  Nothing further needed

## 2013-05-29 ENCOUNTER — Telehealth: Payer: Self-pay | Admitting: *Deleted

## 2013-05-29 NOTE — Telephone Encounter (Signed)
Called the patient to advise her that he Mycobutain was delivered to the clinic and she can pick it up asap. Had to leave a message.

## 2013-06-25 ENCOUNTER — Ambulatory Visit (INDEPENDENT_AMBULATORY_CARE_PROVIDER_SITE_OTHER): Payer: Commercial Managed Care - HMO | Admitting: Internal Medicine

## 2013-06-25 ENCOUNTER — Ambulatory Visit (INDEPENDENT_AMBULATORY_CARE_PROVIDER_SITE_OTHER)
Admission: RE | Admit: 2013-06-25 | Discharge: 2013-06-25 | Disposition: A | Discharge status: Home / Self Care | Payer: Commercial Managed Care - HMO | Source: Ambulatory Visit | Attending: Internal Medicine | Admitting: Internal Medicine

## 2013-06-25 ENCOUNTER — Encounter: Payer: Self-pay | Admitting: Internal Medicine

## 2013-06-25 VITALS — BP 124/68 | HR 83 | Ht 67.0 in | Wt 133.0 lb

## 2013-06-25 DIAGNOSIS — J309 Allergic rhinitis, unspecified: Secondary | ICD-10-CM

## 2013-06-25 DIAGNOSIS — J479 Bronchiectasis, uncomplicated: Secondary | ICD-10-CM

## 2013-06-25 IMAGING — CR DG CHEST 2V
2 series · 2 of 2 positions shown · non-contrast
Comparison: [DATE] and [DATE].  CT [DATE].

CLINICAL DATA: Bronchiectasis with acute exacerbation of atypical
mycobacterial infection.

EXAM:
CHEST  2 VIEW

[view not recorded (1 of 2)]
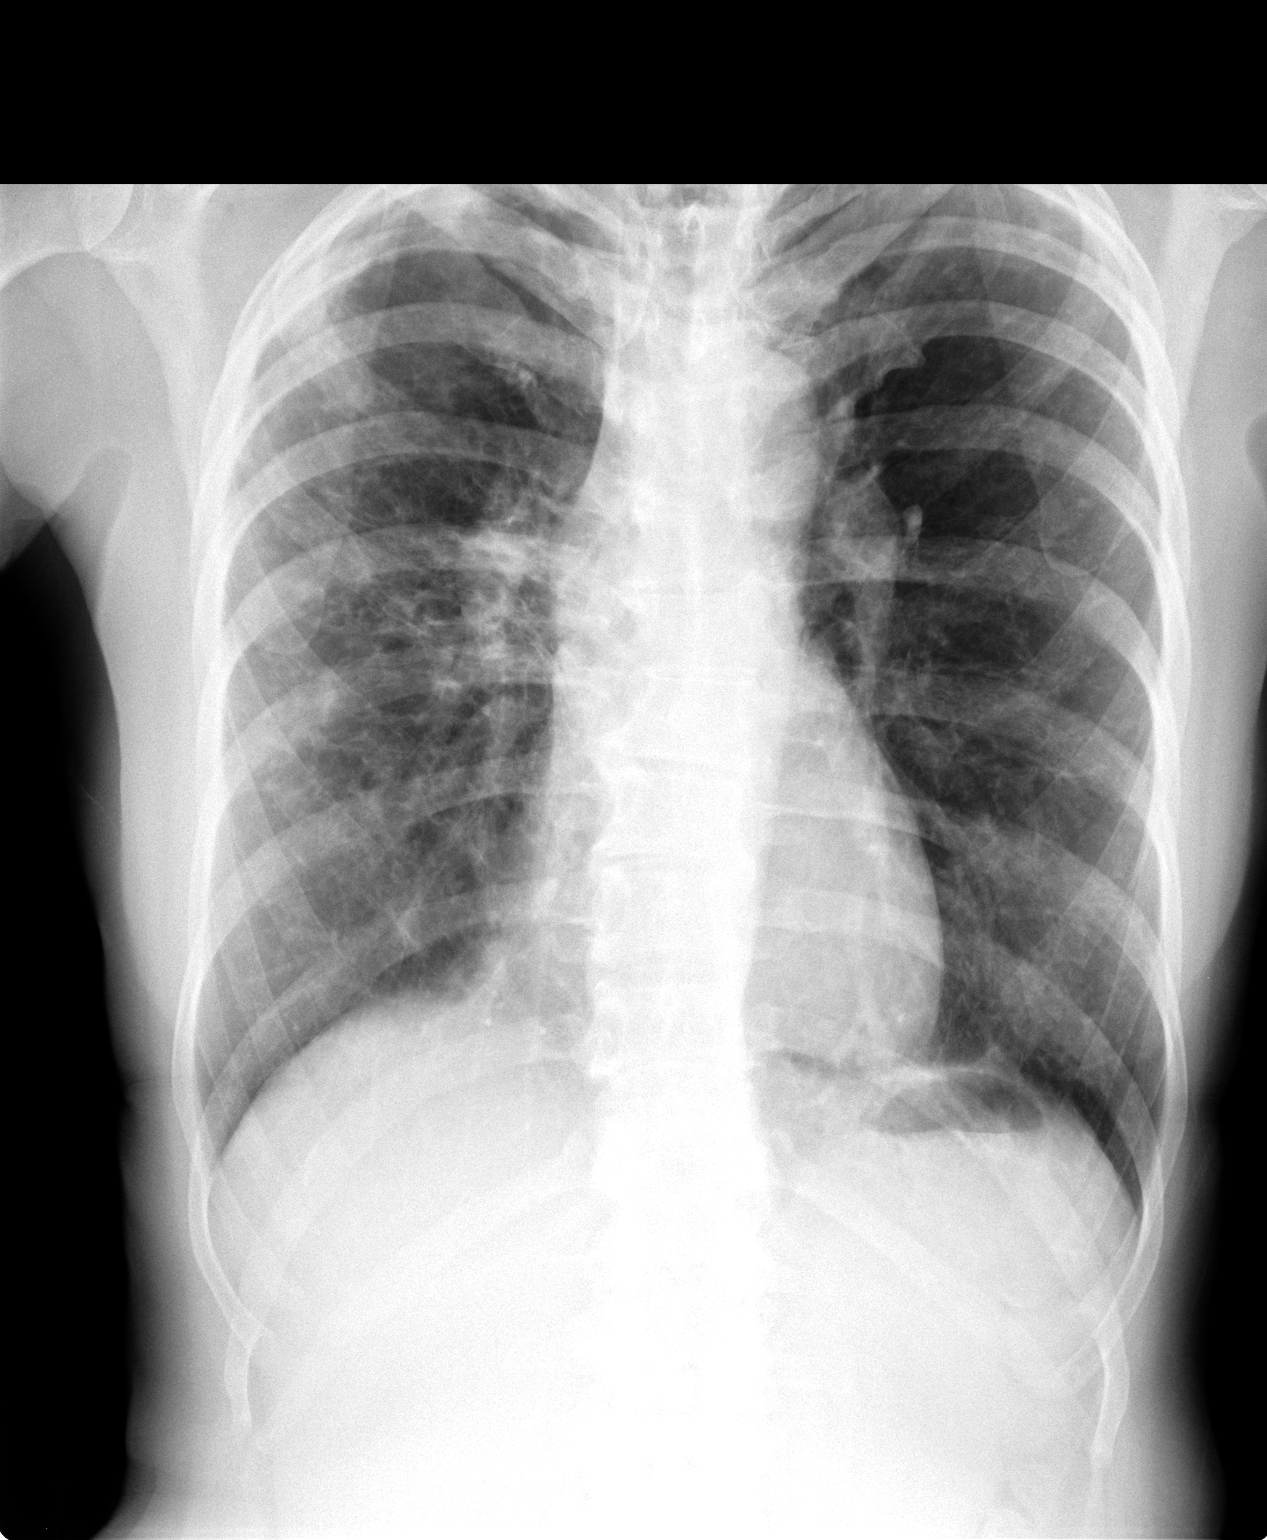

[view not recorded (2 of 2)]
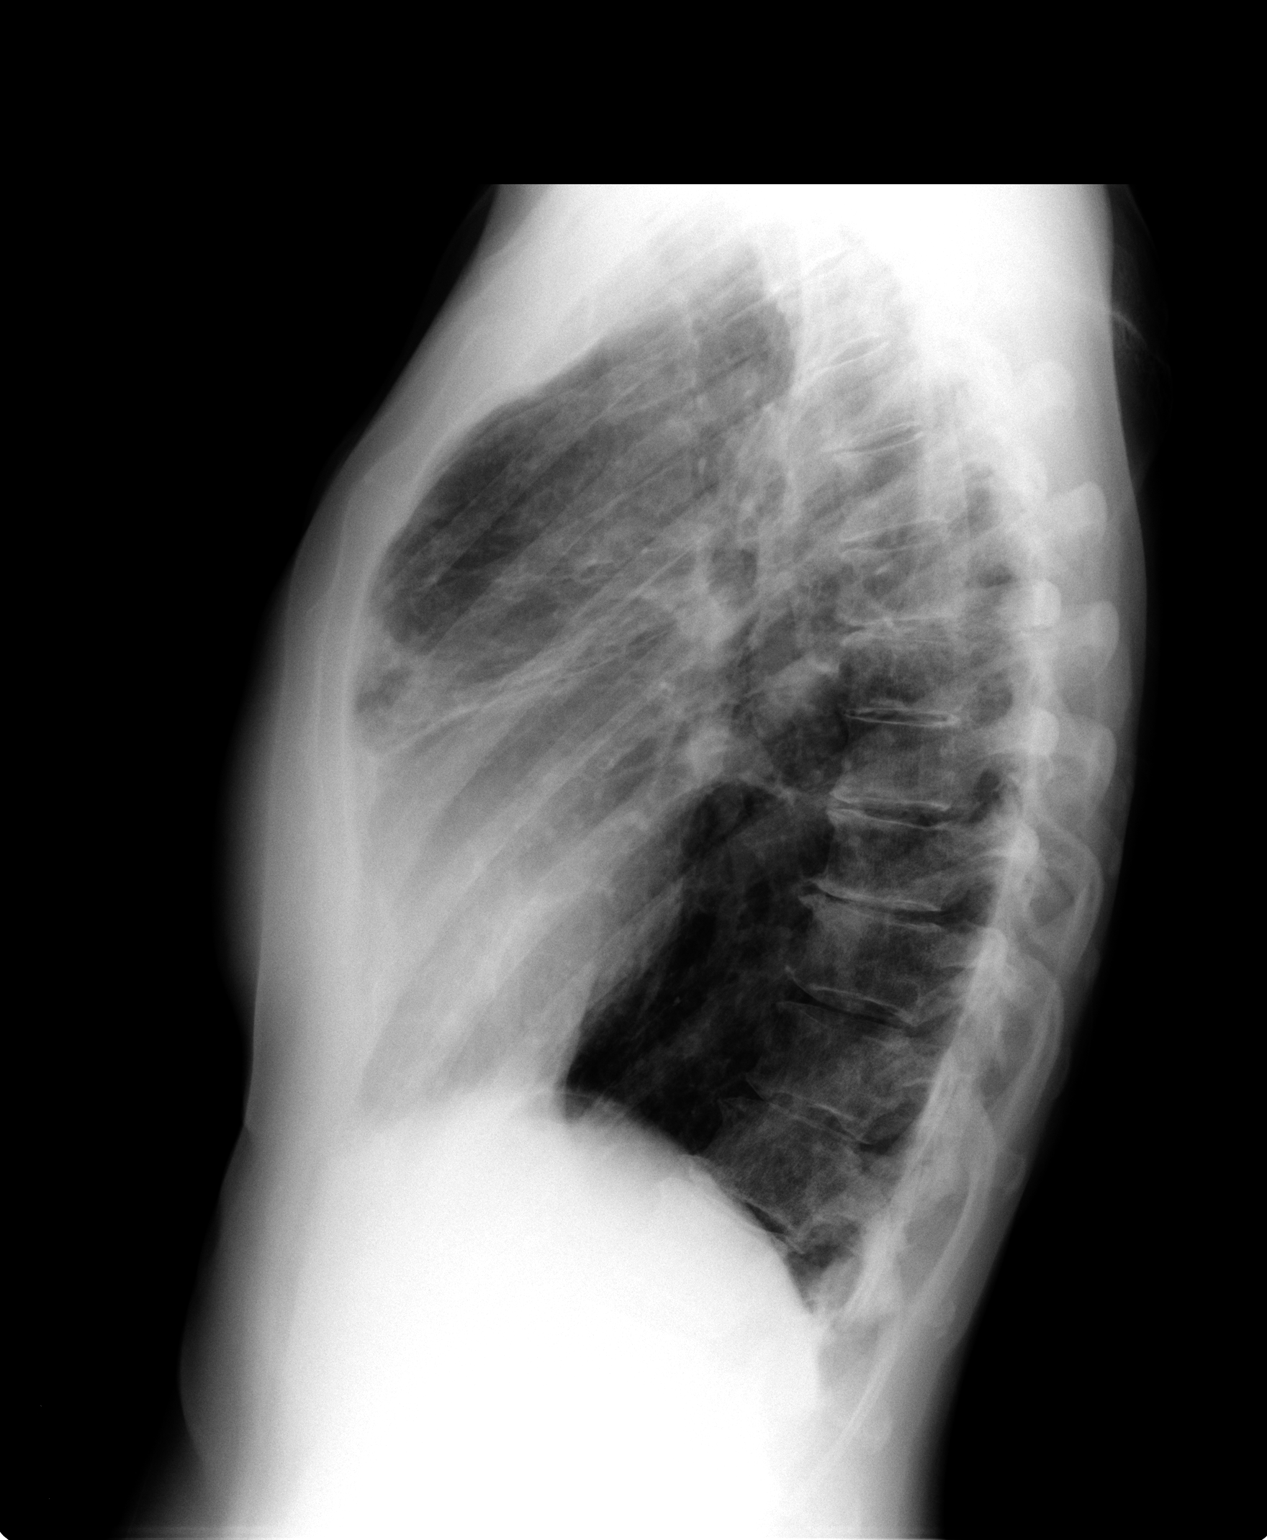

[2 of 2 positions shown; findings below may reference images not displayed]

FINDINGS: Midline trachea. Normal heart size. Right hilar retraction
superiorly, secondary to volume loss and scarring in the right lung
apex. No pleural effusion or pneumothorax. Biapical pleural
parenchymal scarring. Underlying pattern of right greater than left
interstitial prominence/reticular nodular opacification is similar
back to [DATE]. The right lower lobe is slightly improved since
[DATE]. No new airspace consolidation.
IMPRESSION: Since [DATE], slight improvement in right lower lobe aeration.
Otherwise, similar appearance of right greater than left pulmonary
parenchymal findings which are most consistent with atypical
infection, likely mycobacterium avium intracellulare.

## 2013-06-25 NOTE — Progress Notes (Signed)
Patient ID: [name], female    DOB: 10-13-1940, 73 y.o.   MRN: [id number]  HPI 08/01/10- 59 yo F never smoker with hx bronchitis and chronic recurrent hemoptysis. Referred courtesy of Dr Jonathon Jordan. Has noted blood from mouth, mostly at night after she has been asleep, intermittently since at least 2003. Previous ENT eval by Dr Edison Nasuti, Bronchoscopy by Dr Nils Pyle as well as pulmonary evaluation by Dr Gwenette Greet. No source found. She put herself on aspirin as a self-imposed health measure in April and noted onset of bleeding again about a month after that. Self-limited then and no recurrence since.. Denies fever, sweat, nodes, weight loss or chest pain.  CT chest 08/20/03 - advanced COPD with bronchiectasis and blebs.  Lived in [place] for 5 months in 2005- bleeding preceded that trip. Remote pneumonia. PPD negative.  08/30/10- 10 yo F never smoker with hx bronchitis and chronic recurrent hemoptysis. CT 08/01/10- suggests MAIC with scattered nodularity and bronchiectasis. Some improvement since 12/09/07, but some new areas. Images reviewed with her.  She denies any change. Coughs a little, intermittently on first rising, scant phlegm. Denies fever or night sweats. Weight fluctuates in her range.  No heme since last here.   03/14/11- 68 yo F never smoker with hx bronchitis and chronic recurrent hemoptysis, ? MAIC by CT. PCP Dr Jonathon Jordan She chose not to have flu shot. Describes heavy exposure to secondhand smoke while growing up. Has not had further hemoptysis. Now resolving acute bronchitis the began with sinusitis right after Christmas. Over-the-counter meds helped. January 5, bad cough sent her to urgent care [facility]. Chest x-ray done, diagnosed viral bronchitis on COPD and treated with antibiotic and prednisone. She then went to [facility] to stay at her sister's house. While there she went to an urgent care in [place] on January 11 with a repeat chest x-ray and similar  medications. She seemed better but went back to urgent care at  [facility] and was sent to the emergency room. Diagnosed with pneumonia on CT scan at [facility]. Augmentin caused nausea, switched to Z-Pak with prednisone. Now off all antibiotics and prednisone. Using rescue inhaler occasionally. Still some cough with scant clear mucus and rhinorrhea. Some dyspnea on exertion on hills and stairs. No fever or sweating, chest pain or palpitation.  08/22/11- 53 yo F never smoker with hx bronchitis and chronic recurrent hemoptysis, ? MAIC by CT. PCP Dr Jonathon Jordan c/o sob with exertion otherwise doing good.  Last imaging was CT scan at [facility] January 2013. Did finally get flu vaccine in January. Does not go outside very much. Keeping a 53-year-old granddaughter and we discussed exposure to colds. Little cough. One episode of streaky hemoptysis in April. Denies sweats, fever, adenopathy, chest pain, palpitation.  03/14/12- 55 yo F never smoker with hx bronchitis and chronic recurrent hemoptysis, ? MAIC by CT. PCP Dr Jonathon Jordan FOLLOWS FOR: cough at times-slightly productive-green in color. Denies any wheezing,SOB, or congestion. History of recurrent bronchitis and pneumonias. PFT 2012-air trapping. a1AT 08/08/10- Nl MM CXR 09/11/11-reviewed. Significant chronic scarring. IMPRESSION:  No active disease. Stable hyperinflation and chronic interstitial  prominence.  Original Report Authenticated By: Lahoma Crocker, M.D.  09/12/12-71 yo F never smoker with hx bronchitis and chronic recurrent hemoptysis, ? MAIC by CT. PCP Dr Jonathon Jordan FOLLOWS FOR:08-20-12 got sick while at beach and went to [facility] [place] on 08-22-12. CXR done-disc brought today. Had felt better for a while afterwards up until this week-not  as bad as july 4th visit; runny nose, cough, chest congestion, back hurts (upper) form coughing so much. Was given Tessalon Rx for cough by Dr Stephanie Acre on July 8th. Acute  infections in March, July and again in last 4 days starting like colds, with low grade fever, malaise, head then chest congestion. Little fever/ chill. No blood and no GI. We reviewed images of CXR from July 4 disk showing thin chest, bronchiectatic type scarring.  Managing as AECOPD but will give doxy, try for sputum cx, check immunoglobulins. Consider Daliresp nex time.  CT chest - 03/18/12-  IMPRESSION:  No significant radiographic change in the chest since July 2013.  Peribronchial thickening bilaterally with slight reticulonodular  pattern in the right lung and bilateral areas of bronchiectasis.  Given findings on the chest CT of June 2012, the possibility of a  chronic persistent Mycobacterium avium complex infection cannot be  excluded.  Original Report Authenticated By: Curlene Dolphin, M.D.  12/12/12- 45 yo F never smoker with hx bronchitis and chronic recurrent hemoptysis, + MAIC. PCP Dr Jonathon Jordan SPUTUM 09/12/12 POS MAIC FOLLOWS FOR:  Breathing is unchanged.  At times experiencing wheezing DOE with brisk walk/ on hills. No routine cough. Last antibiotic slowly cleared acute bronchitis after last visit. Denies sweats, fever, nodes. Has had hemoptysis off and on in the past attributed to chronic bronchitis. Immunoglobulins were okay     She wants to defer treatment for [facility], following for now. CT 09/12/12 IMPRESSION:  1. 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 The disease appears slightly  progressed compared to the prior study, as discussed above.  2. Extensive air trapping in the lungs bilaterally.  3. No findings to suggest an interstitial lung disease at this  time.  4. Atherosclerosis, including left anterior descending coronary  artery disease.  Original Report Authenticated By: Vinnie Langton, M.D.  02/10/13- 5 yo F never smoker with hx bronchitis and  chronic recurrent hemoptysis, + MAIC 09/12/12. Rx: 02/10/13- zith 500 mg TIW, EMB 1200 TIW, Rif 300 mg TIW PCP Dr Jonathon Jordan Much cough but it not as much yellow.  Produces sputum once or twice a day. Feels okay and denies fever, night sweats or blood recently. Using Tessalon Perles up to 3 daily. CXR 02/10/13-  IMPRESSION:  Chronic infiltrates in right lung question related to chronic MAI  infection versus scarring.  Underlying emphysema/chronic bronchitic changes with biapical  scarring.  No acute abnormalities.  Electronically Signed  By: Lavonia Dana M.D.  On: 02/10/2013 09:33  06/25/13-  61 yo F never smoker with hx bronchitis/ bronchiectasis and chronic recurrent hemoptysis, + MAIC 09/12/12.complicated by glaucoma, hx sinusitis Rx: 02/10/13- zith 500 mg TIW, EMB 1200 TIW, Rif 300 mg TIW Follows for: Pt says cough has improved since last visit. C.o SOB with exertion. Continues on triple Rx being followed by Dr Johnnye Sima at [facility] clinic. Less morning cough. Last hemoptysis was late March. Some dyspnea on exertion. Drugs are well tolerated. Diagnosed with glaucoma  Review of Systems-see HPI Constitutional:   No-   weight loss, night sweats, fevers, chills, fatigue, lassitude. HEENT:   No-  headaches, difficulty swallowing, tooth/dental problems, sore throat,       No- sneezing, no-itching, ear ache, nasal congestion, post nasal drip,  CV:  No-   chest pain, orthopnea, PND, swelling in lower extremities, anasarca, dizziness, palpitations Resp: +  shortness of breath with exertion  or at rest.            +  productive cough,  + non-productive cough,  No- recent coughing up of blood.              No-   change in color of mucus.  No- wheezing.   Skin: No-   rash or lesions. GI:  No-   heartburn, indigestion, abdominal pain, nausea, vomiting,  GU:  MS:  No-   joint pain or swelling.  . Neuro-     nothing unusual Psych:  No- change in mood or affect. No depression or anxiety.  No memory  loss.  Objective:   Physical Exam General- Alert, Oriented, Affect-cheerful, Distress- none acute. Thin. Skin- rash-none, lesions- none, excoriation- none Lymphadenopathy- none Head- atraumatic            Eyes- Gross vision intact, PERRLA, conjunctivae clear secretions            Ears- Hearing, canals-normal            Nose- Clear, no-Septal dev, mucus, polyps, erosion, perforation             Throat- Mallampati II , mucosa clear , drainage- none, tonsils- atrophic Neck- flexible , trachea midline, no stridor , thyroid nl, carotid no bruit Chest - symmetrical excursion , unlabored           Heart/CV- RRR/ occ extra beat , no murmur , no gallop  , no rub, nl s1 s2                           - JVD- none , edema- none, stasis changes- none, varices- none           Lung- +crackles-minimal, cough+dry , dullness-none, rub- none           Chest wall-  Abd-  Br/ Gen/ Rectal- Not done, not indicated Extrem- cyanosis- none, clubbing, none, atrophy- none, strength- nl.  Neuro- grossly intact to observation

## 2013-06-25 NOTE — Patient Instructions (Signed)
Order CXR    Dx atypical mycobacterium/ MAIC  Please call as needed

## 2013-07-17 ENCOUNTER — Telehealth: Payer: Self-pay | Admitting: *Deleted

## 2013-07-17 ENCOUNTER — Telehealth: Payer: Self-pay | Admitting: Internal Medicine

## 2013-07-17 NOTE — Telephone Encounter (Signed)
Dr. Johnnye Sima, please see previous note from Dr. Melvyn Novas. Patient called their office requesting antibiotic for possible sinus infection and was advised to call Dr. Johnnye Sima because she is already on 3 antibiotics for [facility]. Myrtis Hopping

## 2013-07-17 NOTE — Telephone Encounter (Signed)
Pt aware of recs from MW and will call Dr Algis Downs office. Nothing more needed at this time.

## 2013-07-17 NOTE — Telephone Encounter (Signed)
Spoke with patient-she is having a ? Sinus infection. Runny nose, sore throat x 2-3 days, productive cough-yellow-started this AM, slight chills, denies any fevers. Was recently seen 06-25-13 by CY.   MW as doc of day please advise. Thanks.

## 2013-07-17 NOTE — Telephone Encounter (Signed)
She's already on big time abx per Dr Johnnye Sima so needs to call him with advice regarding additional abx  - in meantime avoid mint/ menthol/chocolate/cough drops and just use sugarless candy or ice for throat

## 2013-07-28 ENCOUNTER — Other Ambulatory Visit: Payer: Self-pay

## 2013-07-28 DIAGNOSIS — Z1231 Encounter for screening mammogram for malignant neoplasm of breast: Secondary | ICD-10-CM

## 2013-07-29 ENCOUNTER — Ambulatory Visit (INDEPENDENT_AMBULATORY_CARE_PROVIDER_SITE_OTHER): Payer: Commercial Managed Care - HMO | Admitting: Infectious Diseases

## 2013-07-29 ENCOUNTER — Encounter: Payer: Self-pay | Admitting: Infectious Diseases

## 2013-07-29 VITALS — BP 147/72 | HR 81 | Temp 97.5°F | Wt 134.0 lb

## 2013-07-29 DIAGNOSIS — J309 Allergic rhinitis, unspecified: Secondary | ICD-10-CM

## 2013-07-29 DIAGNOSIS — J479 Bronchiectasis, uncomplicated: Secondary | ICD-10-CM

## 2013-07-29 MED ORDER — FLUTICASONE PROPIONATE 50 MCG/ACT NA SUSP: 2.0000 | Freq: Every day | NASAL | Status: DC

## 2013-07-29 MED ORDER — CETIRIZINE HCL 10 MG PO TBDP: 10.0000 mg | ORAL_TABLET | Freq: Every day | ORAL | Status: DC

## 2013-07-29 NOTE — Progress Notes (Signed)
   Subjective:    Patient ID: [name], female    DOB: Dec 21, 1940, 73 y.o.   MRN: [id number]  HPI 73 yo F with hx of COPD, no hx of smoking, has been followed by Dr Annamaria Boots for hemoptysis. She has been found to have MAI on sputum Cx (09-12-12). She had CT 09/12/12 :  1. 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 The disease appears slightly  progressed compared to the prior study, as discussed above.  2. Extensive air trapping in the lungs bilaterally.  3. No findings to suggest an interstitial lung disease at this  time.  4. Atherosclerosis, including left anterior descending coronary  artery disease.  On 03/06/13 she was started on azithro 500 mg TIW, EMB 1200 TIW, Rif 300 mg TIW.  Had repeat CXR May 2015 and noted to have slight improvement.  Had sinus infection 2 weeks ago. Started taking OTC has helped somewhat (antihistamine, can't remember name). Has nasal drainage all day. No sneezing or cough. Occas yellow phlegm (volume same as previous). Worried that sinus infection may lead to chronic bronchitis, COPD (which she has).  Has been taking her A/E/R without difficulty. Denies side effects.   Thinks she had chills related to weather, does not believe that she had "high" fever. Doesn't usually go over 100 (unless I'm really, really sick).  Has glaucoma, no acute change. Has floaters, occas film on her eyes. Weight is a little up.   Review of Systems  Constitutional: Negative for fever and chills.  HENT: Positive for rhinorrhea.   Eyes: Negative for visual disturbance.  Respiratory: Positive for cough.   Cardiovascular: Positive for leg swelling.  Gastrointestinal: Negative for diarrhea and constipation.  Genitourinary: Negative for difficulty urinating.       Objective:   Physical Exam  Constitutional: She appears well-developed and well-nourished.  HENT:    Nose: Right sinus exhibits no maxillary sinus tenderness and no frontal sinus tenderness. Left sinus exhibits no maxillary sinus tenderness and no frontal sinus tenderness.  Mouth/Throat: No oropharyngeal exudate.  Eyes: EOM are normal. Pupils are equal, round, and reactive to light.  Neck: Neck supple.  Cardiovascular: Normal rate, regular rhythm and normal heart sounds.   Pulmonary/Chest: Effort normal and breath sounds normal.  Abdominal: Soft. Bowel sounds are normal. She exhibits no distension. There is no tenderness.  Musculoskeletal: She exhibits edema.  1+ edema.   Lymphadenopathy:    She has no cervical adenopathy.          Assessment & Plan:

## 2013-07-29 NOTE — Assessment & Plan Note (Signed)
Will give her OTC zyrtec and flonase to try and dry her up. Will not give her any additional anbx. She will call if not improved.

## 2013-07-29 NOTE — Assessment & Plan Note (Signed)
Will continue her 3 drug therapy with plan for her to complete 1 year. Will check her LFTs. rtc in 6 months.

## 2013-07-30 LAB — COMPREHENSIVE METABOLIC PANEL
ALT: 15 U/L (ref 0–35)
AST: 22 U/L (ref 0–37)
Albumin: 4.1 g/dL (ref 3.5–5.2)
Alkaline Phosphatase: 99 U/L (ref 39–117)
BILIRUBIN TOTAL: 0.3 mg/dL (ref 0.2–1.2)
BUN: 17 mg/dL (ref 6–23)
CALCIUM: 9.3 mg/dL (ref 8.4–10.5)
CO2: 29 mEq/L (ref 19–32)
CREATININE: 0.82 mg/dL (ref 0.50–1.10)
Chloride: 99 mEq/L (ref 96–112)
Glucose, Bld: 95 mg/dL (ref 70–99)
Potassium: 4 mEq/L (ref 3.5–5.3)
Sodium: 137 mEq/L (ref 135–145)
Total Protein: 6.9 g/dL (ref 6.0–8.3)

## 2013-08-02 ENCOUNTER — Encounter: Payer: Self-pay | Admitting: Internal Medicine

## 2013-08-02 NOTE — Assessment & Plan Note (Signed)
No recent complaint

## 2013-08-02 NOTE — Assessment & Plan Note (Signed)
Continues to follow with Infectious Disease Chronic bronchitis symptoms are improved Plan-chest x-ray

## 2013-08-31 ENCOUNTER — Ambulatory Visit
Admission: RE | Admit: 2013-08-31 | Discharge: 2013-08-31 | Disposition: A | Discharge status: Home / Self Care | Payer: Commercial Managed Care - HMO | Source: Ambulatory Visit

## 2013-08-31 DIAGNOSIS — Z1231 Encounter for screening mammogram for malignant neoplasm of breast: Secondary | ICD-10-CM

## 2013-12-04 ENCOUNTER — Other Ambulatory Visit: Payer: Self-pay

## 2013-12-29 ENCOUNTER — Encounter: Payer: Self-pay | Admitting: Internal Medicine

## 2013-12-29 ENCOUNTER — Ambulatory Visit (INDEPENDENT_AMBULATORY_CARE_PROVIDER_SITE_OTHER)
Admission: RE | Admit: 2013-12-29 | Discharge: 2013-12-29 | Disposition: A | Discharge status: Home / Self Care | Payer: Commercial Managed Care - HMO | Source: Ambulatory Visit | Attending: Internal Medicine | Admitting: Internal Medicine

## 2013-12-29 ENCOUNTER — Ambulatory Visit (INDEPENDENT_AMBULATORY_CARE_PROVIDER_SITE_OTHER): Payer: Commercial Managed Care - HMO | Admitting: Internal Medicine

## 2013-12-29 VITALS — BP 102/58 | HR 76 | Ht 67.0 in | Wt 141.6 lb

## 2013-12-29 DIAGNOSIS — R042 Hemoptysis: Secondary | ICD-10-CM

## 2013-12-29 DIAGNOSIS — J471 Bronchiectasis with (acute) exacerbation: Secondary | ICD-10-CM

## 2013-12-29 DIAGNOSIS — J309 Allergic rhinitis, unspecified: Secondary | ICD-10-CM

## 2013-12-29 DIAGNOSIS — J441 Chronic obstructive pulmonary disease with (acute) exacerbation: Secondary | ICD-10-CM

## 2013-12-29 IMAGING — CR DG CHEST 2V
2 series · 2 of 2 positions shown · non-contrast
Comparison: [DATE] chest radiograph; correlation CT chest
[DATE]

CLINICAL DATA: Bronchiectasis with acute exacerbation, followup,
cough with hemoptysis, personal history of COPD

EXAM:
CHEST  2 VIEW

[view not recorded (1 of 2)]
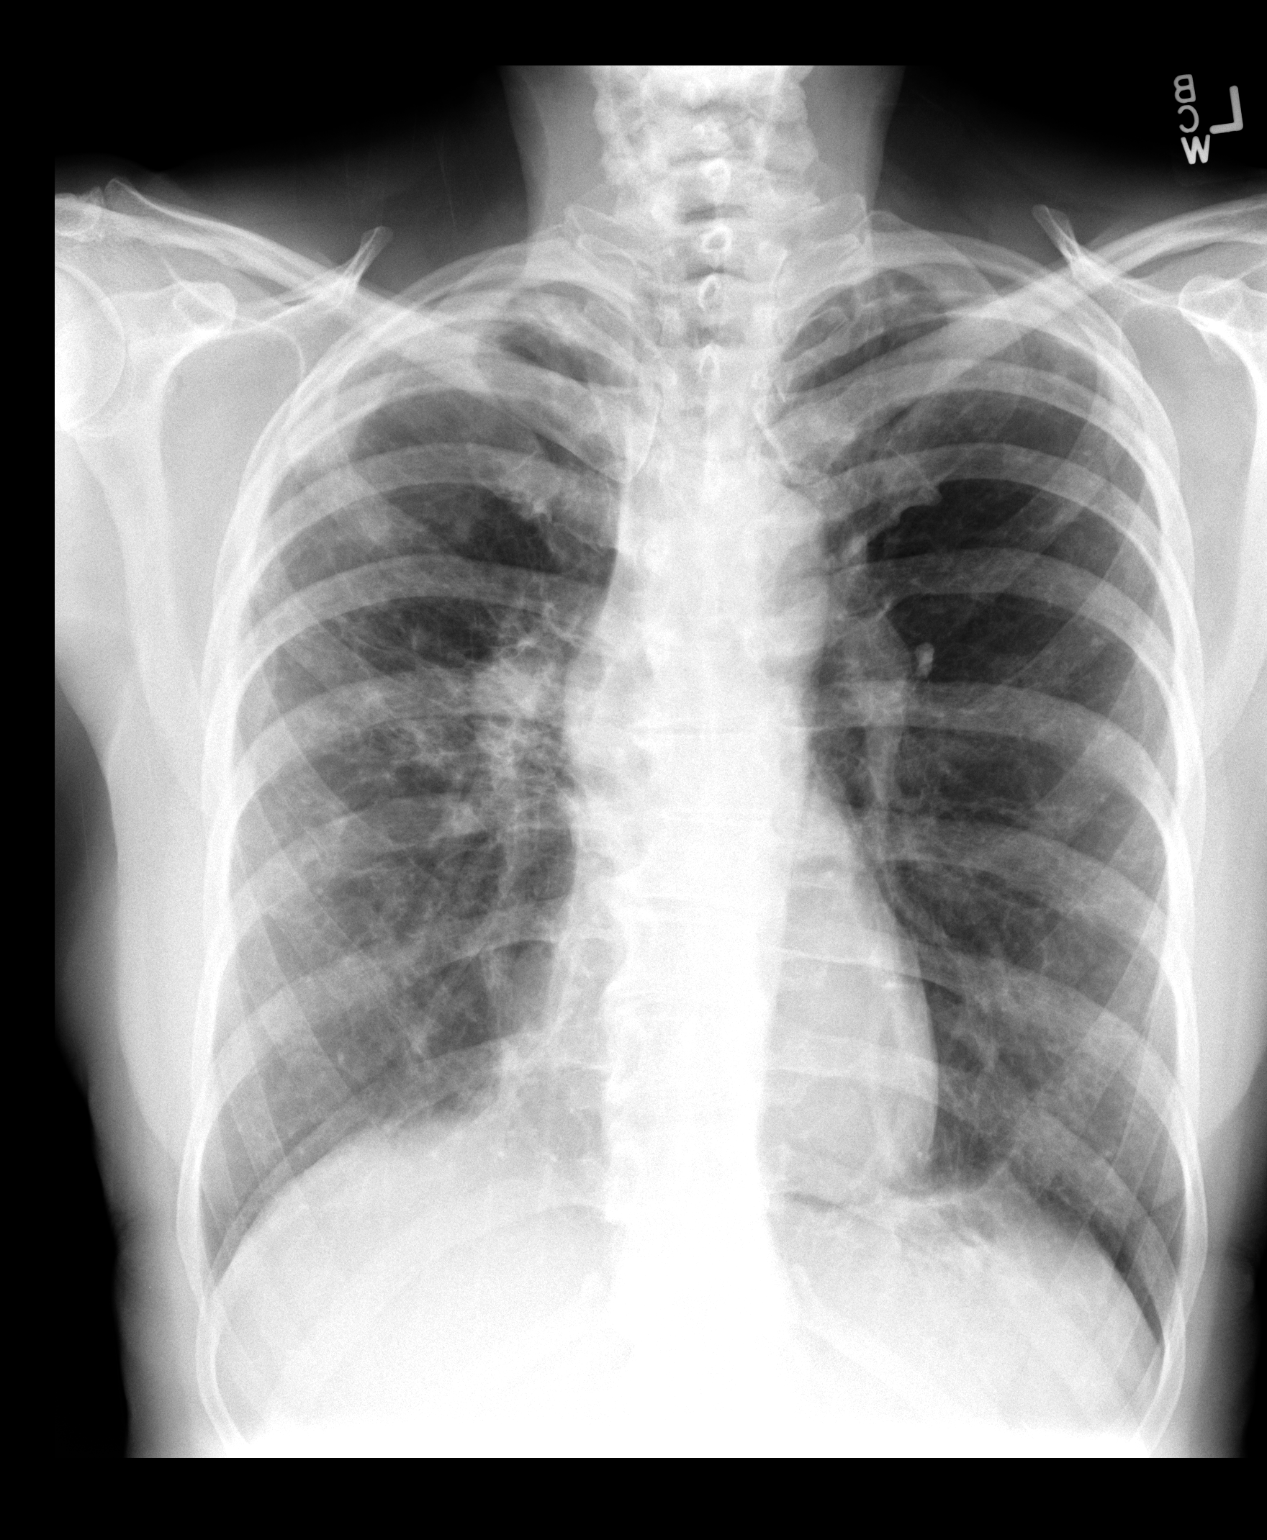

[view not recorded (2 of 2)]
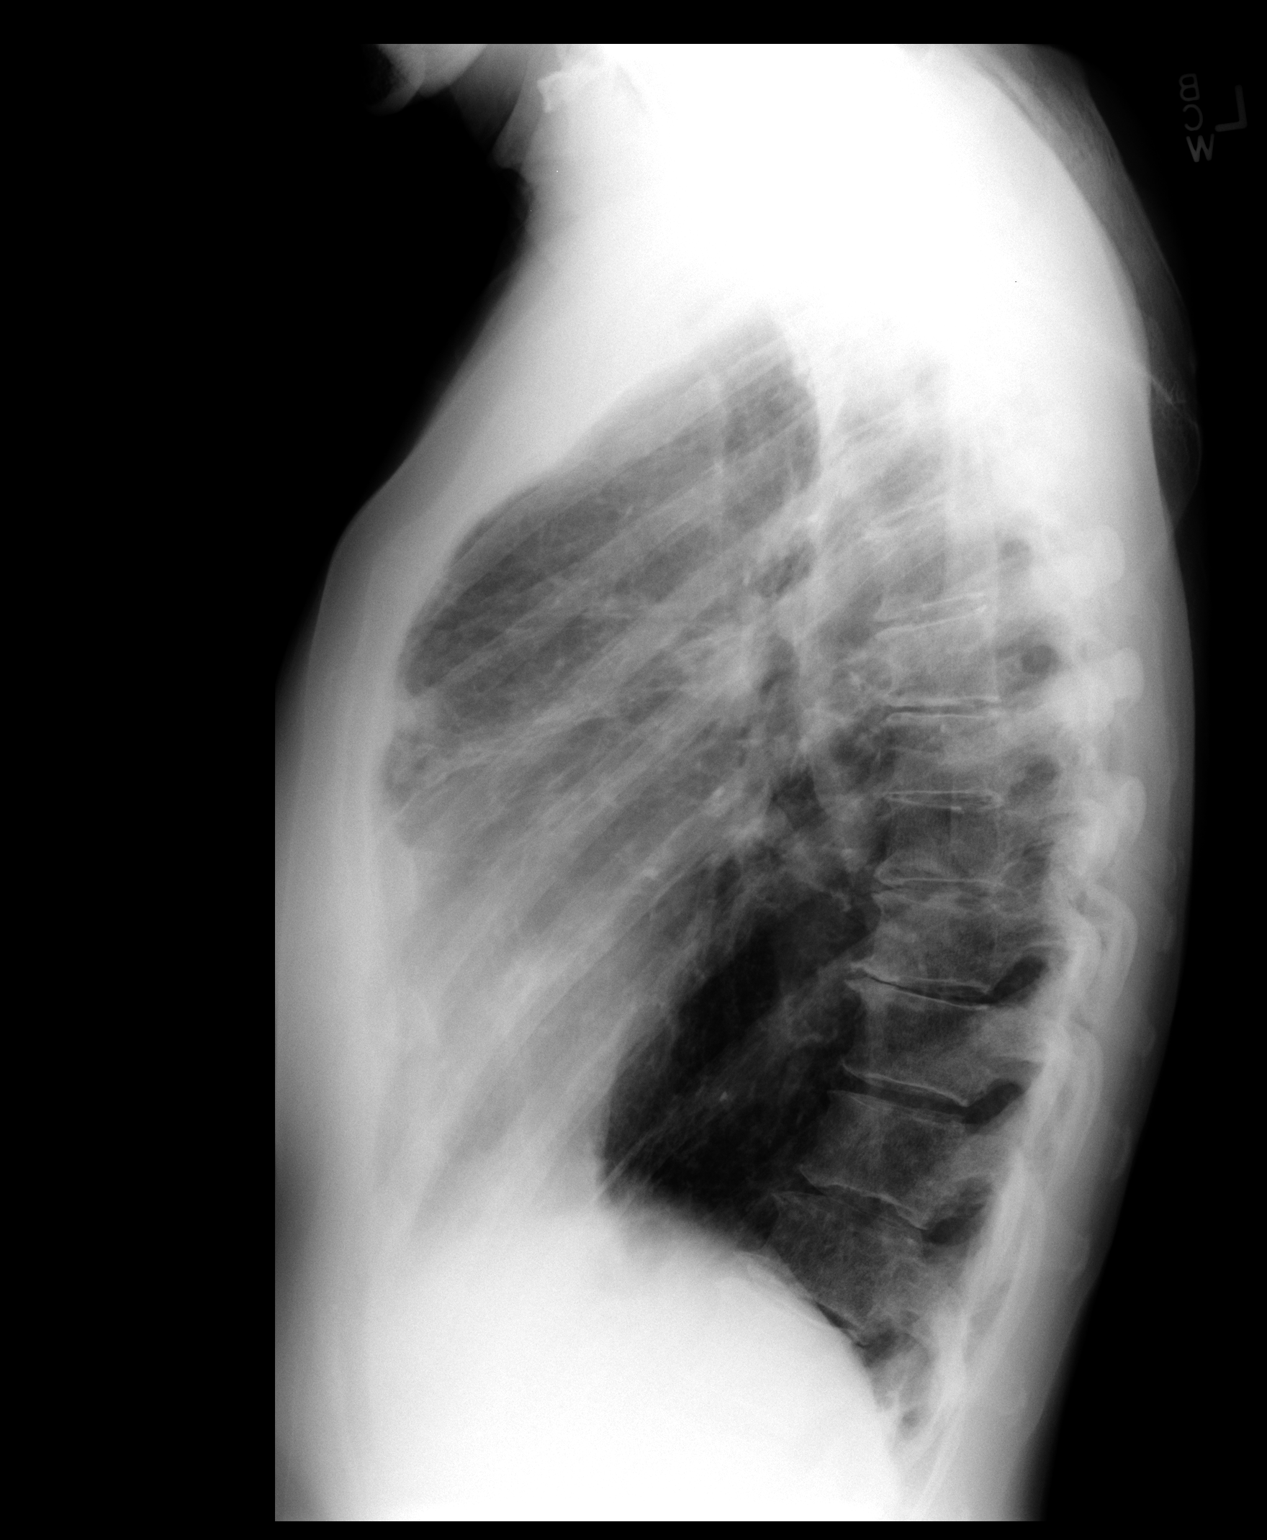

[2 of 2 positions shown; findings below may reference images not displayed]

FINDINGS: Normal heart size, mediastinal contours and pulmonary vascularity.

Emphysematous changes with biapical scarring greater on RIGHT.

RIGHT upper lobe volume loss with superior retraction of RIGHT
hilum.

Chronic parenchymal changes are again identified in both lungs,
greater in RIGHT upper lobe, with scattered areas of interstitial
thickening infiltrate and slight nodularity, similar to previous
exam.

Underlying emphysematous changes.

No new infiltrates, pleural effusion or pneumothorax.

Stable osseous findings.
IMPRESSION: Emphysematous changes with chronic parenchymal changes bilaterally
greatest in RIGHT upper lobe, including areas of interstitial
infiltrate, opacity and nodularity.

Bronchiectasis present on prior CT is less well visualized
radiographically.

No interval change.

## 2013-12-29 NOTE — Assessment & Plan Note (Addendum)
Better control in the past year. Discussed relation to season changes.she is not describing acute symptoms

## 2013-12-29 NOTE — Patient Instructions (Addendum)
Order- Sputum culture- routine C&S, AFB, Fungal     Dx bronchiectasis w exacerbation  Order- CXR

## 2013-12-29 NOTE — Assessment & Plan Note (Signed)
This has been complication of her bronchiectasis, currently controlled with potential to return

## 2013-12-29 NOTE — Assessment & Plan Note (Signed)
Recent thicker yellow sputum w/o night sweats or blood. We can update sputum cx and CXR before her visit with ID/ Dr Myra Gianotti in late December

## 2013-12-29 NOTE — Progress Notes (Signed)
Patient ID: [name], female    DOB: 10-13-1940, 73 y.o.   MRN: [id number]  HPI 08/01/10- 59 yo F never smoker with hx bronchitis and chronic recurrent hemoptysis. Referred courtesy of Dr Jonathon Jordan. Has noted blood from mouth, mostly at night after she has been asleep, intermittently since at least 2003. Previous ENT eval by Dr Edison Nasuti, Bronchoscopy by Dr Nils Pyle as well as pulmonary evaluation by Dr Gwenette Greet. No source found. She put herself on aspirin as a self-imposed health measure in April and noted onset of bleeding again about a month after that. Self-limited then and no recurrence since.. Denies fever, sweat, nodes, weight loss or chest pain.  CT chest 08/20/03 - advanced COPD with bronchiectasis and blebs.  Lived in [place] for 5 months in 2005- bleeding preceded that trip. Remote pneumonia. PPD negative.  08/30/10- 10 yo F never smoker with hx bronchitis and chronic recurrent hemoptysis. CT 08/01/10- suggests MAIC with scattered nodularity and bronchiectasis. Some improvement since 12/09/07, but some new areas. Images reviewed with her.  She denies any change. Coughs a little, intermittently on first rising, scant phlegm. Denies fever or night sweats. Weight fluctuates in her range.  No heme since last here.   03/14/11- 68 yo F never smoker with hx bronchitis and chronic recurrent hemoptysis, ? MAIC by CT. PCP Dr Jonathon Jordan She chose not to have flu shot. Describes heavy exposure to secondhand smoke while growing up. Has not had further hemoptysis. Now resolving acute bronchitis the began with sinusitis right after Christmas. Over-the-counter meds helped. January 5, bad cough [facility] Chest x-ray done, diagnosed viral bronchitis on COPD and treated with antibiotic and prednisone. She then went to [facility] to stay at her sister's house. While there she went to an urgent care in [place] on January 11 with a repeat chest x-ray and similar  medications. She seemed better but went back to urgent care at  [facility] and was sent to the emergency room. Diagnosed with pneumonia on CT scan at [facility]. Augmentin caused nausea, switched to Z-Pak with prednisone. Now off all antibiotics and prednisone. Using rescue inhaler occasionally. Still some cough with scant clear mucus and rhinorrhea. Some dyspnea on exertion on hills and stairs. No fever or sweating, chest pain or palpitation.  08/22/11- 53 yo F never smoker with hx bronchitis and chronic recurrent hemoptysis, ? MAIC by CT. PCP Dr Jonathon Jordan c/o sob with exertion otherwise doing good.  Last imaging was CT scan at [facility] January 2013. Did finally get flu vaccine in January. Does not go outside very much. Keeping a 53-year-old granddaughter and we discussed exposure to colds. Little cough. One episode of streaky hemoptysis in April. Denies sweats, fever, adenopathy, chest pain, palpitation.  03/14/12- 55 yo F never smoker with hx bronchitis and chronic recurrent hemoptysis, ? MAIC by CT. PCP Dr Jonathon Jordan FOLLOWS FOR: cough at times-slightly productive-green in color. Denies any wheezing,SOB, or congestion. History of recurrent bronchitis and pneumonias. PFT 2012-air trapping. a1AT 08/08/10- Nl MM CXR 09/11/11-reviewed. Significant chronic scarring. IMPRESSION:  No active disease. Stable hyperinflation and chronic interstitial  prominence.  Original Report Authenticated By: Lahoma Crocker, M.D.  09/12/12-71 yo F never smoker with hx bronchitis and chronic recurrent hemoptysis, ? MAIC by CT. PCP Dr Jonathon Jordan FOLLOWS FOR:08-20-12 got sick while at beach and went to [facility] on 08-22-12. CXR done-disc brought today. Had felt better for a while afterwards up until this week-not  as bad as july 4th visit; runny nose, cough, chest congestion, back hurts (upper) form coughing so much. Was given Tessalon Rx for cough by Dr Stephanie Acre on July 8th. Acute  infections in March, July and again in last 4 days starting like colds, with low grade fever, malaise, head then chest congestion. Little fever/ chill. No blood and no GI. We reviewed images of CXR from July 4 disk showing thin chest, bronchiectatic type scarring.  Managing as AECOPD but will give doxy, try for sputum cx, check immunoglobulins. Consider Daliresp nex time.  CT chest - 03/18/12-  IMPRESSION:  No significant radiographic change in the chest since July 2013.  Peribronchial thickening bilaterally with slight reticulonodular  pattern in the right lung and bilateral areas of bronchiectasis.  Given findings on the chest CT of June 2012, the possibility of a  chronic persistent Mycobacterium avium complex infection cannot be  excluded.  Original Report Authenticated By: Curlene Dolphin, M.D.  12/12/12- 89 yo F never smoker with hx bronchitis and chronic recurrent hemoptysis, + MAIC. PCP Dr Jonathon Jordan SPUTUM 09/12/12 POS MAIC FOLLOWS FOR:  Breathing is unchanged.  At times experiencing wheezing DOE with brisk walk/ on hills. No routine cough. Last antibiotic slowly cleared acute bronchitis after last visit. Denies sweats, fever, nodes. Has had hemoptysis off and on in the past attributed to chronic bronchitis. Immunoglobulins were okay     She wants to defer treatment for [facility], following for now. CT 09/12/12 IMPRESSION:  1. 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 The disease appears slightly  progressed compared to the prior study, as discussed above.  2. Extensive air trapping in the lungs bilaterally.  3. No findings to suggest an interstitial lung disease at this  time.  4. Atherosclerosis, including left anterior descending coronary  artery disease.  Original Report Authenticated By: Vinnie Langton, M.D.  02/10/13- 46 yo F never smoker with hx bronchitis and  chronic recurrent hemoptysis, + MAIC 09/12/12. Rx: 02/10/13- zith 500 mg TIW, EMB 1200 TIW, Rif 300 mg TIW PCP Dr Jonathon Jordan Much cough but it not as much yellow.  Produces sputum once or twice a day. Feels okay and denies fever, night sweats or blood recently. Using Tessalon Perles up to 3 daily. CXR 02/10/13-  IMPRESSION:  Chronic infiltrates in right lung question related to chronic MAI  infection versus scarring.  Underlying emphysema/chronic bronchitic changes with biapical  scarring.  No acute abnormalities.  Electronically Signed  By: Lavonia Dana M.D.  On: 02/10/2013 09:33  06/25/13-  89 yo F never smoker with hx bronchitis/ bronchiectasis and chronic recurrent hemoptysis, + MAIC 09/12/12.complicated by glaucoma, hx sinusitis Rx: 02/10/13- zith 500 mg TIW, EMB 1200 TIW, Rif 300 mg TIW Follows for: Pt says cough has improved since last visit. C.o SOB with exertion. Continues on triple Rx being followed by Dr Johnnye Sima at [facility] clinic. Less morning cough. Last hemoptysis was late March. Some dyspnea on exertion. Drugs are well tolerated. Diagnosed with glaucoma  11/101/15- 66 yo F never smoker with hx bronchitis/ bronchiectasis and chronic recurrent hemoptysis, + MAIC 09/12/12.complicated by glaucoma, hx sinusitis Rx: 02/10/13- zith 500 mg TIW, EMB 1200 TIW, Rif 300 mg TIW followed by I.D. FOLLOWS FOR: Denies any complaints today. Still taking abx regimen for [facility] reports good tolerance with meds. Recent cough productive of thick yellow sputum2 weeks with no sweats or obvious fever, blood or adenopathy.  CXR 06/25/13 IMPRESSION: Since 02/10/2013, slight improvement in right lower lobe aeration. Otherwise, similar appearance of right greater than left pulmonary parenchymal findings which are most consistent with atypical infection, likely mycobacterium avium intracellulare. Electronically Signed  By: Abigail Miyamoto M.D.  On: 06/25/2013 15:45  Review of Systems-see HPI Constitutional:    No-   weight loss, night sweats, fevers, chills, fatigue, lassitude. HEENT:   No-  headaches, difficulty swallowing, tooth/dental problems, sore throat,       No- sneezing, no-itching, ear ache, nasal congestion, post nasal drip,  CV:  No-   chest pain, orthopnea, PND, swelling in lower extremities, anasarca, dizziness, palpitations Resp: +  shortness of breath with exertion or at rest.            +  productive cough,  + non-productive cough,  No- recent coughing up of blood.              + change in color of mucus.  No- wheezing.   Skin: No-   rash or lesions. GI:  No-   heartburn, indigestion, abdominal pain, nausea, vomiting,  GU:  MS:  No-   joint pain or swelling.  . Neuro-     nothing unusual Psych:  No- change in mood or affect. No depression or anxiety.  No memory loss.  Objective:   Physical Exam General- Alert, Oriented, Affect-cheerful, Distress- none acute. Thin. Skin- rash-none, lesions- none, excoriation- none Lymphadenopathy- none Head- atraumatic            Eyes- Gross vision intact, PERRLA, conjunctivae clear secretions            Ears- Hearing, canals-normal            Nose- Clear, no-Septal dev, mucus, polyps, erosion, perforation             Throat- Mallampati II , mucosa clear , drainage- none, tonsils- atrophic Neck- flexible , trachea midline, no stridor , thyroid nl, carotid no bruit Chest - symmetrical excursion , unlabored           Heart/CV- RRR/ occ extra beat , no murmur , no gallop  , no rub, nl s1 s2                           - JVD- none , edema- none, stasis changes- none, varices- none           Lung- +crackles-minimal R base, cough+dry , dullness-none, rub- none           Chest wall-  Abd-  Br/ Gen/ Rectal- Not done, not indicated Extrem- cyanosis- none, clubbing, none, atrophy- none, strength- nl.  Neuro- grossly intact to observation

## 2014-01-02 NOTE — Assessment & Plan Note (Signed)
Acute bronchitis syndrome at this time of year may be an incidental viral chest cold. Need to exclude exacerbation of her MAIC, Plan-keep appointment with Dr. Johnnye Sima, sputum culture and chest x-ray now

## 2014-01-04 ENCOUNTER — Other Ambulatory Visit: Payer: Self-pay | Admitting: Internal Medicine

## 2014-01-04 ENCOUNTER — Other Ambulatory Visit: Payer: Commercial Managed Care - HMO

## 2014-01-04 DIAGNOSIS — J471 Bronchiectasis with (acute) exacerbation: Secondary | ICD-10-CM

## 2014-01-04 DIAGNOSIS — R042 Hemoptysis: Secondary | ICD-10-CM

## 2014-01-07 LAB — RESPIRATORY CULTURE OR RESPIRATORY AND SPUTUM CULTURE
Culture: NORMAL
ORGANISM ID, BACTERIA: NORMAL

## 2014-01-27 ENCOUNTER — Ambulatory Visit: Payer: Commercial Managed Care - HMO | Admitting: Infectious Diseases

## 2014-02-01 LAB — FUNGUS CULTURE W SMEAR: SMEAR RESULT: NONE SEEN

## 2014-02-04 ENCOUNTER — Ambulatory Visit: Payer: Commercial Managed Care - HMO | Admitting: Infectious Diseases

## 2014-02-08 ENCOUNTER — Ambulatory Visit (INDEPENDENT_AMBULATORY_CARE_PROVIDER_SITE_OTHER): Payer: Commercial Managed Care - HMO | Admitting: Infectious Diseases

## 2014-02-08 ENCOUNTER — Encounter: Payer: Self-pay | Admitting: Infectious Diseases

## 2014-02-08 VITALS — BP 149/71 | HR 89 | Temp 97.6°F | Wt 142.0 lb

## 2014-02-08 DIAGNOSIS — J479 Bronchiectasis, uncomplicated: Secondary | ICD-10-CM

## 2014-02-08 NOTE — Progress Notes (Signed)
   Subjective:    Patient ID: [name], female    DOB: [birth year], 73 y.o.   MRN: [id number]  HPI 73 yo F with hx of COPD, no hx of smoking, has been followed by Dr Annamaria Boots for hemoptysis. She has been found to have MAI on sputum Cx (09-12-12). She had CT 09/12/12 :  1. 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 The disease appears slightly  progressed compared to the prior study, as discussed above.  2. Extensive air trapping in the lungs bilaterally.  3. No findings to suggest an interstitial lung disease at this  time.  4. Atherosclerosis, including left anterior descending coronary  artery disease.  On 03/06/13 she was started on azithro 500 mg TIW, EMB 1200 TIW, Rif 300 mg TIW. Has had one episode of heavy hemoptysis since (in January). Believes she has had hemoptysis for 10 yrs.   Was seen by pulm 12-2013 and had CXR slightly better. She had sputum Cx sowing ? Of Nocardia.  Still having cough, yellow sputum, not dark. Still has nasal congestion. No recent hemoptysis.  No fever or chills.  Wt has been stable.  Has B gluacoma, has been seeing ophtho.   Review of Systems     Objective:   Physical Exam  Constitutional: She appears well-developed and well-nourished.  HENT:  Mouth/Throat: No oropharyngeal exudate.  Eyes: EOM are normal. Pupils are equal, round, and reactive to light.  Neck: Neck supple.  Cardiovascular: Normal rate, regular rhythm and normal heart sounds.   Pulmonary/Chest: Effort normal.    Abdominal: Soft. Bowel sounds are normal. She exhibits no distension. There is no tenderness.  Lymphadenopathy:    She has no cervical adenopathy.          Assessment & Plan:

## 2014-02-08 NOTE — Assessment & Plan Note (Addendum)
She will complete 1 year of MAI therapy January 2016. I have asked her to stop her meds at that time. She has had ophhto f/u. She has maintained her wt and her resp fxn seems fairly stable. WIll also refer her to Dr Lake Bells for possible inhaled amikacin study.   I am concerned about the isolation of Nocardia from her sputum. I spent over 10 minutes on hold with the lab, this isolation is still incomplete.  Will see her back in Jan to begin rx for this after she stops MAI rx.

## 2014-02-22 ENCOUNTER — Telehealth: Payer: Self-pay | Admitting: Internal Medicine

## 2014-02-22 ENCOUNTER — Telehealth: Payer: Self-pay | Admitting: *Deleted

## 2014-02-22 DIAGNOSIS — R042 Hemoptysis: Secondary | ICD-10-CM

## 2014-02-22 NOTE — Telephone Encounter (Signed)
Per CY: Pt needs outpatient CXR this week. Results will be called once received.  Dx Hemoptysis. Order placed for CXR ----- LM for patient to return call.

## 2014-02-22 NOTE — Telephone Encounter (Signed)
Patient called c/o cough with hemoptysis; which she has not had for about a year. Advised patient to call her pulmonologist Dr. Annamaria Boots and advised her I will let Dr. Johnnye Sima know. Her follow up appointment here is 03/11/14. Myrtis Hopping CMA

## 2014-02-22 NOTE — Telephone Encounter (Signed)
Last OV 12-29-13. Pt states that she has been coughing up blood x 2 days. Pt states that when she first wakes up she coughs up about 2 tablespoons of blood. She states that she contiues to cough up small amounts of blood  during the day that is mixed with mucus. Pt states she has noticed a lot of "gurgling" in her chest that is worse at night as well. Please advise. Kingston Bing, CMA  Allergies  Allergen Reactions  . Augmentin [Amoxicillin-Pot Clavulanate] Nausea And Vomiting  . Black Cohosh Nausea And Vomiting    Severe GI Upset, sweats  . Codeine Nausea And Vomiting    Severe GI upset, sweats  . Hyoscyamine     Cramps, made urinary symptoms worse  . Oxybutynin Chloride     Cramps, made urinary symptoms worse    Current Outpatient Prescriptions on File Prior to Visit  Medication Sig Dispense Refill  . azithromycin (ZITHROMAX) 250 MG tablet 2 tabs, 3 days per week 24 each 11  . Cetirizine HCl 10 MG TBDP Take 10 mg by mouth daily. 30 tablet 3  . citalopram (CELEXA) 20 MG tablet Take 20 mg by mouth daily.      Marland Kitchen estradiol (ESTRACE) 0.5 MG tablet Take 0.5 mg by mouth daily.      Marland Kitchen ethambutol (MYAMBUTOL) 400 MG tablet 3 tabs, 3 times/ week 36 tablet 11  . ferrous gluconate (FERGON) 246 (28 FE) MG tablet Take 246 mg by mouth daily with breakfast.      . fluticasone (FLONASE) 50 MCG/ACT nasal spray Place 2 sprays into both nostrils daily. 15 g 2  . Fluticasone-Salmeterol (ADVAIR) 250-50 MCG/DOSE AEPB Inhale 1 puff into the lungs every 12 (twelve) hours.    Marland Kitchen latanoprost (XALATAN) 0.005 % ophthalmic solution Place 1 drop into both eyes at bedtime.    . LevOCARNitine (CARNITINE PO) Take by mouth.    . levothyroxine (SYNTHROID, LEVOTHROID) 88 MCG tablet Take 88 mcg by mouth daily.      . meclizine (ANTIVERT) 25 MG tablet Take 25 mg by mouth daily as needed.      . Multiple Minerals (CALCIUM/MAGNESIUM/ZINC) TABS Take 3 tablets by mouth at bedtime. 1800mg     . Multiple Vitamin (MULTIVITAMIN)  tablet Take 1 tablet by mouth daily.      . Naproxen Sodium (ALEVE) 220 MG CAPS Take by mouth. Take as directed prn    . rifabutin (MYCOBUTIN) 150 MG capsule 2 tabs, 3 days/ week 24 capsule 11  . vitamin C (ASCORBIC ACID) 500 MG tablet Take 500 mg by mouth daily.       No current facility-administered medications on file prior to visit.

## 2014-02-23 ENCOUNTER — Ambulatory Visit (INDEPENDENT_AMBULATORY_CARE_PROVIDER_SITE_OTHER)
Admission: RE | Admit: 2014-02-23 | Discharge: 2014-02-23 | Disposition: A | Discharge status: Home / Self Care | Payer: Commercial Managed Care - HMO | Source: Ambulatory Visit | Attending: Internal Medicine | Admitting: Internal Medicine

## 2014-02-23 DIAGNOSIS — R042 Hemoptysis: Secondary | ICD-10-CM | POA: Diagnosis not present

## 2014-02-23 DIAGNOSIS — J449 Chronic obstructive pulmonary disease, unspecified: Secondary | ICD-10-CM | POA: Diagnosis not present

## 2014-02-23 LAB — REFERRED ASSAY

## 2014-02-23 IMAGING — CR DG CHEST 2V
2 series · 2 of 2 positions shown · non-contrast
Comparison: PA and lateral chest of [DATE].

CLINICAL DATA: Three days of cough and hemoptysis; history of COPD
and bronchiectasis ; history of MARINKA infection

EXAM:
CHEST  2 VIEW

[view not recorded (1 of 2)]
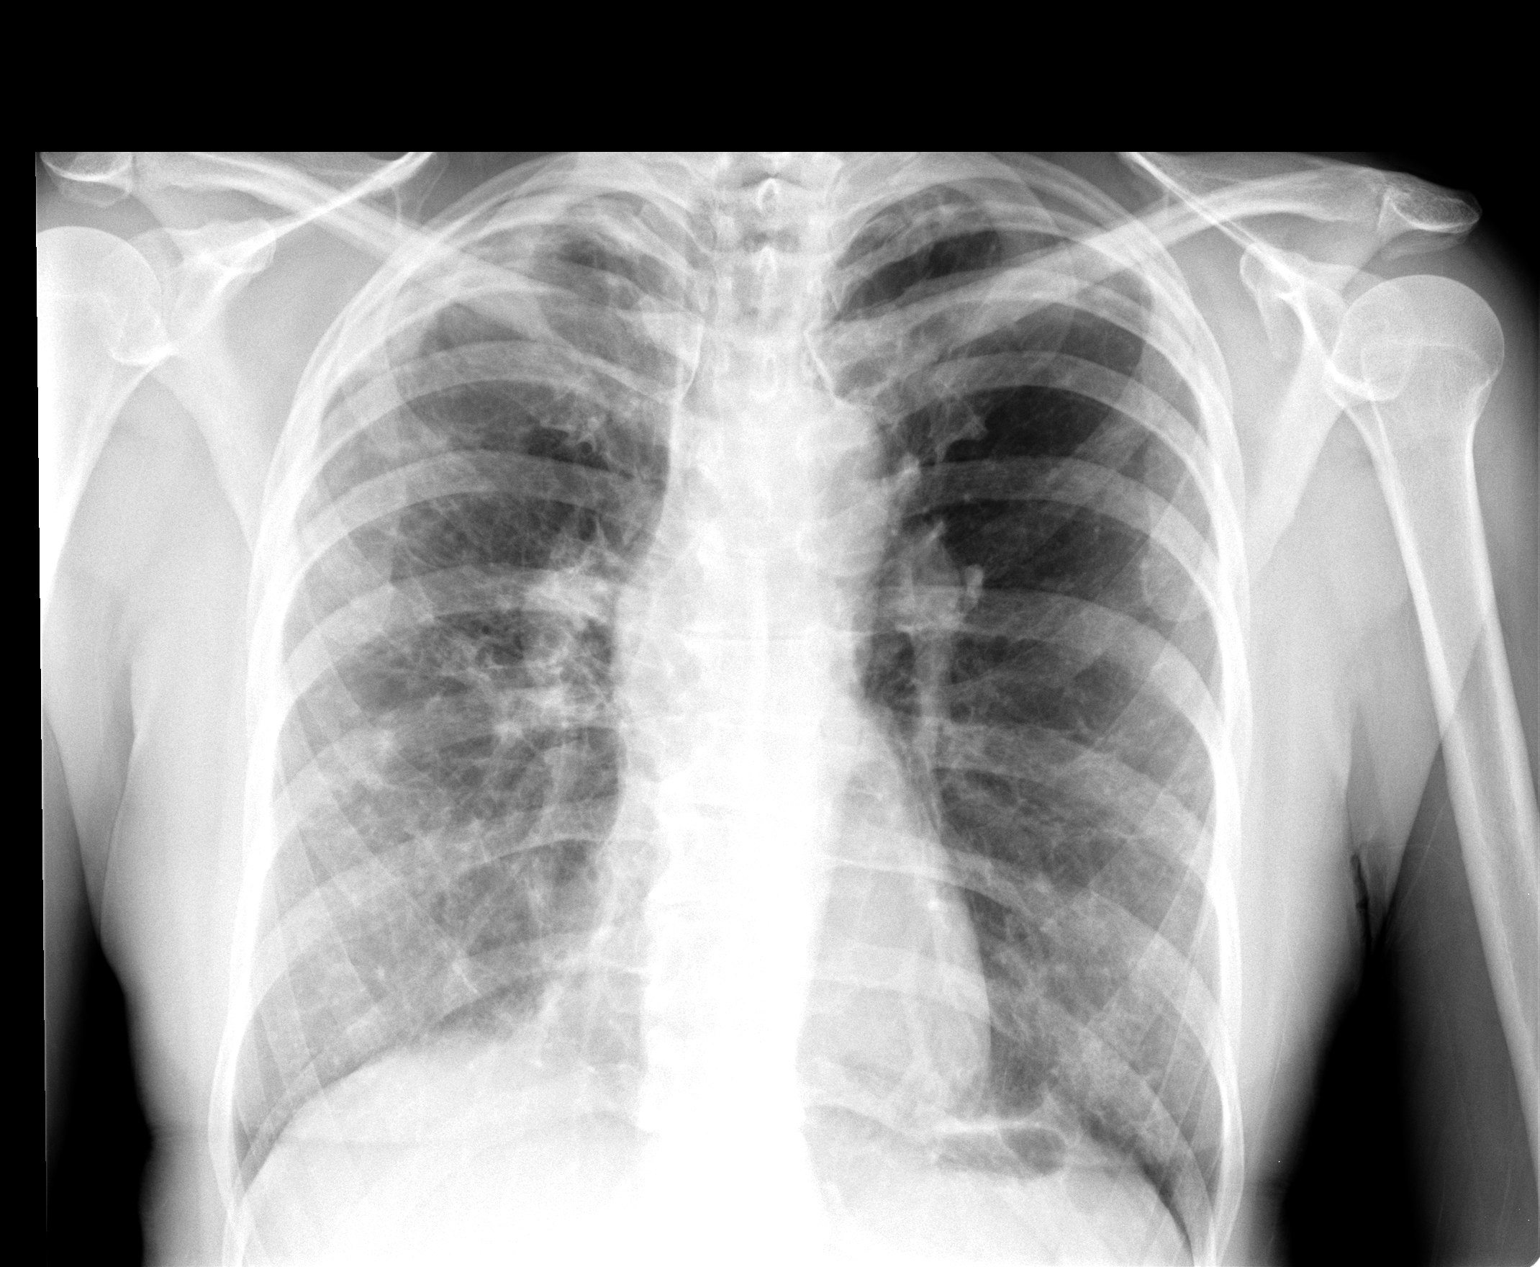

[view not recorded (2 of 2)]
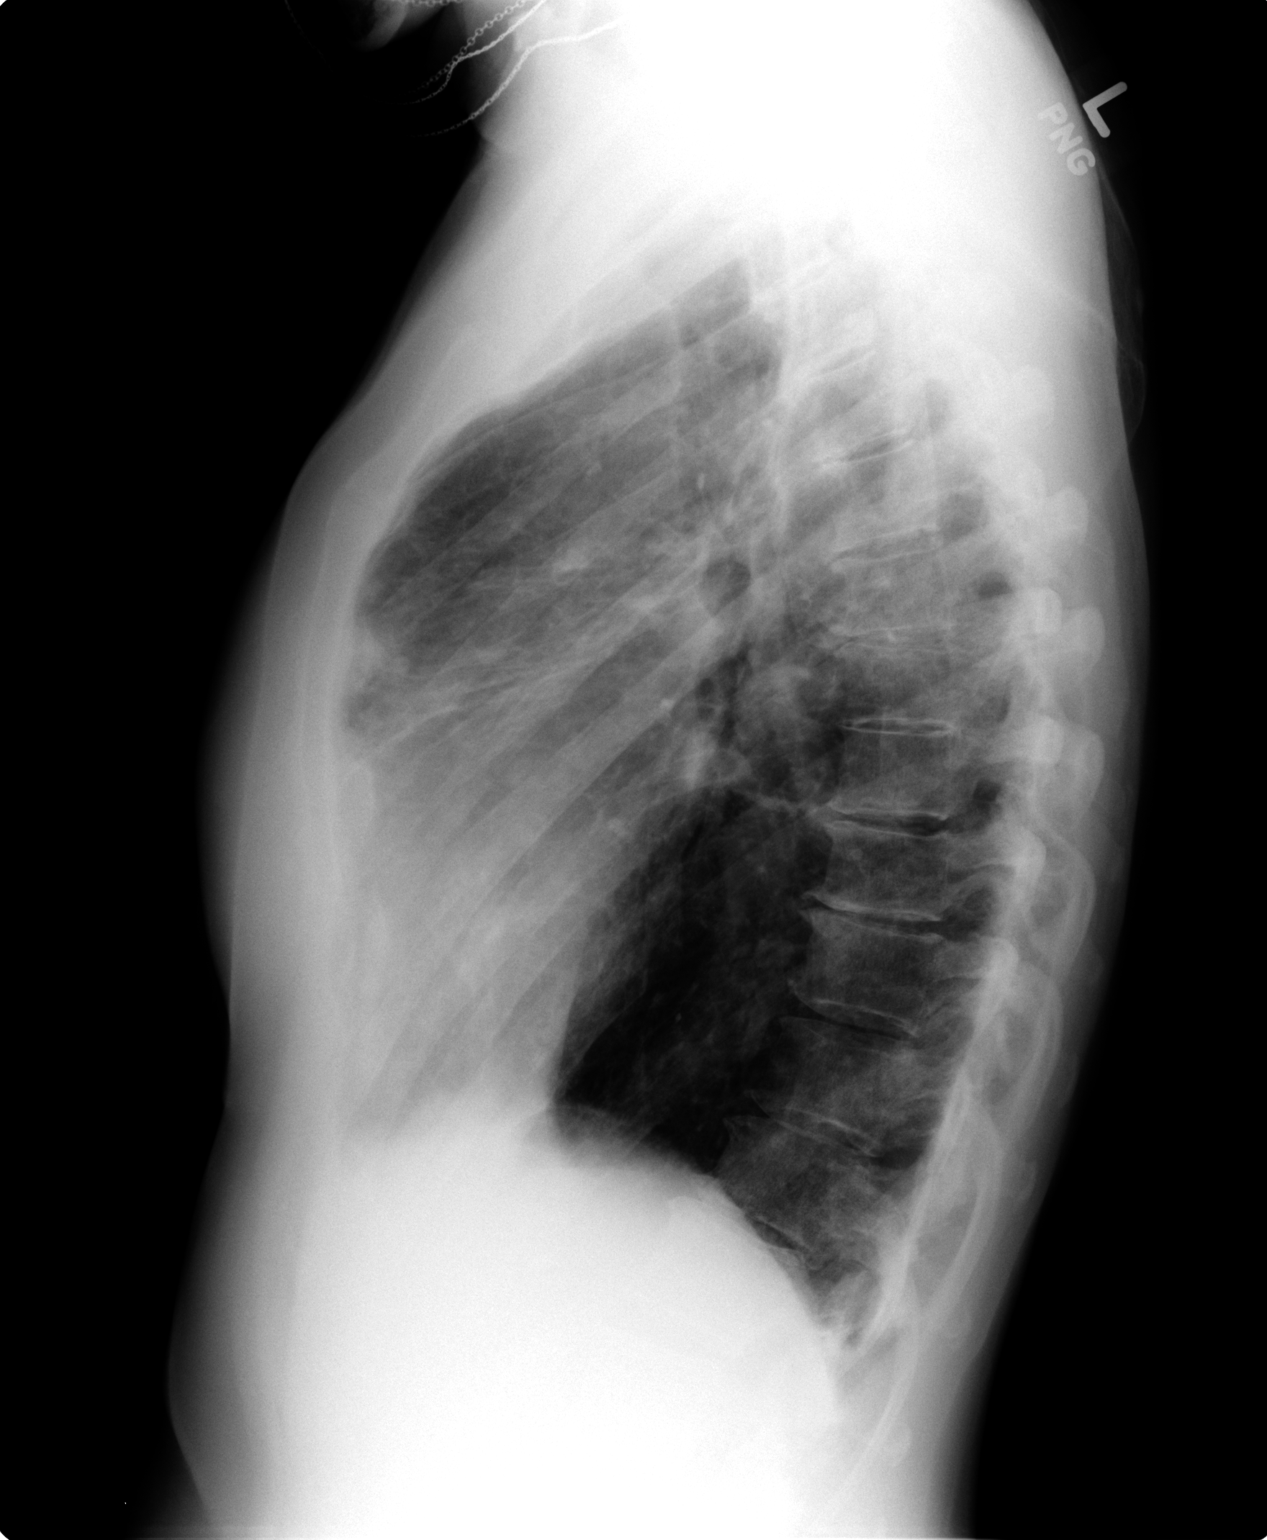

[2 of 2 positions shown; findings below may reference images not displayed]

FINDINGS: The lungs remain hyperinflated. The interstitial markings are
increased bilaterally. This is most conspicuous on the right. This
asymmetry is stable. There is no mediastinal shift. The heart and
pulmonary vascularity are normal. There is no pleural effusion or
pneumothorax. The bony thorax is unremarkable.
IMPRESSION: COPD with chronically increased pulmonary interstitial markings
likely reflecting the sequelae of previous infection. There is no
alveolar pneumonia nor evidence of CHF.

## 2014-02-23 NOTE — Telephone Encounter (Signed)
Patient returning call.  [phone]

## 2014-02-23 NOTE — Telephone Encounter (Signed)
Results have been explained to patient, pt expressed understanding. Nothing further needed.  

## 2014-02-23 NOTE — Telephone Encounter (Signed)
Called and spoke to pt. Informed pt of the recs per CY. Pt verbalized understanding and denied any further questions or concerns at this time.

## 2014-02-23 NOTE — Telephone Encounter (Signed)
I can't tell if we have called her CXR report. There were chronic changes but no new process. Bleeding should stop over next few days. Let us know if there are problems.

## 2014-02-25 ENCOUNTER — Telehealth: Payer: Self-pay | Admitting: Internal Medicine

## 2014-02-25 NOTE — Telephone Encounter (Signed)
Ok to stop the azithromycin and myambutol, take the Septra-DS, then wait to restart anything until she talks with Dr Johnnye Sima.

## 2014-02-25 NOTE — Telephone Encounter (Signed)
Notes Recorded by Lorane Gell, CMA on 02/24/2014 at 12:54 PM LMTCB Notes Recorded by Deneise Lever, MD on 02/23/2014 at 2:51 PM Sputum cultures so far are not showing AFB, like the [facility]. She is growing a bacterium named Nocardia, which we treat with sulfa drugs. Plan- order script for Septra-DX # 30, 1 twice daily  Notes Recorded by Lorane Gell, CMA on 02/24/2014 at 12:54 PM LMTCB Notes Recorded by Deneise Lever, MD on 02/23/2014 at 2:42 PM CXR looks stable with no active area or change to suggest new problem. I expect the bleeding to stop on its own in a day or two. Let us know.  Pt aware of results. Would like to know prior to sending Septra DX to pharmacy([facility]) if she should stop the Azithromycin and Myambutol while taking the Septra or safe to take all together(pt is due to stop Azithromycin and Myambutol on 03-06-14 per Dr Johnnye Sima). Please advise. Thanks.

## 2014-02-25 NOTE — Telephone Encounter (Signed)
Pt is aware of below. Nothing further needed 

## 2014-03-08 LAB — AFB CULTURE WITH SMEAR (NOT AT ARMC): Acid Fast Smear: NONE SEEN

## 2014-03-10 ENCOUNTER — Encounter: Payer: Self-pay | Admitting: Infectious Diseases

## 2014-03-10 DIAGNOSIS — A430 Pulmonary nocardiosis: Secondary | ICD-10-CM | POA: Insufficient documentation

## 2014-03-11 ENCOUNTER — Ambulatory Visit (INDEPENDENT_AMBULATORY_CARE_PROVIDER_SITE_OTHER): Payer: Commercial Managed Care - HMO | Admitting: Infectious Diseases

## 2014-03-11 ENCOUNTER — Encounter: Payer: Self-pay | Admitting: Infectious Diseases

## 2014-03-11 VITALS — BP 150/70 | HR 79 | Temp 97.9°F | Ht 67.0 in | Wt 138.0 lb

## 2014-03-11 DIAGNOSIS — J471 Bronchiectasis with (acute) exacerbation: Secondary | ICD-10-CM

## 2014-03-11 DIAGNOSIS — A430 Pulmonary nocardiosis: Secondary | ICD-10-CM

## 2014-03-11 LAB — COMPREHENSIVE METABOLIC PANEL
ALK PHOS: 78 U/L (ref 39–117)
ALT: 17 U/L (ref 0–35)
AST: 24 U/L (ref 0–37)
Albumin: 3.8 g/dL (ref 3.5–5.2)
BUN: 17 mg/dL (ref 6–23)
CALCIUM: 9.5 mg/dL (ref 8.4–10.5)
CO2: 29 meq/L (ref 19–32)
Chloride: 102 mEq/L (ref 96–112)
Creat: 1.01 mg/dL (ref 0.50–1.10)
Glucose, Bld: 79 mg/dL (ref 70–99)
Potassium: 4.2 mEq/L (ref 3.5–5.3)
SODIUM: 139 meq/L (ref 135–145)
Total Bilirubin: 0.3 mg/dL (ref 0.2–1.2)
Total Protein: 6.7 g/dL (ref 6.0–8.3)

## 2014-03-11 MED ORDER — MINOCYCLINE HCL 100 MG PO CAPS: 100.0000 mg | ORAL_CAPSULE | Freq: Two times a day (BID) | ORAL | Status: DC

## 2014-03-11 MED ORDER — SULFAMETHOXAZOLE-TRIMETHOPRIM 800-160 MG PO TABS: 1.0000 | ORAL_TABLET | Freq: Two times a day (BID) | ORAL | Status: DC

## 2014-03-11 NOTE — Assessment & Plan Note (Addendum)
Will start her therapy for this. Will give her 2 drug therapy (mino/bactrim) for 3 months and see her back at that time.  Will check her CMP today.

## 2014-03-11 NOTE — Progress Notes (Signed)
   Subjective:    Patient ID: [name], female    DOB: 1940-11-23, 74 y.o.   MRN: [id number]  HPI 74 yo F with hx of COPD, no hx of smoking, has been followed by Dr Annamaria Boots for hemoptysis. She has been found to have MAI on sputum Cx (09-12-12). She had CT 09/12/12 :  1. 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 The disease appears slightly  progressed compared to the prior study, as discussed above.  2. Extensive air trapping in the lungs bilaterally.  3. No findings to suggest an interstitial lung disease at this  time.  4. Atherosclerosis, including left anterior descending coronary  artery disease.  On 03/06/13 she was started on azithro 500 mg TIW, EMB 1200 TIW, Rif 300 mg TIW. Has had one episode of heavy hemoptysis since (in January). Believes she has had hemoptysis for 10 yrs.   Was seen by pulm 12-2013 and had CXR slightly better. 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  Has been feeling well. Has been off E/R/A for her MAI since 03-06-14.  No cough, no SOB. No fevers, chills.  Had episodes of hemoptysis the week of January 4/5th.  Had f/u CXR 02-23-14: COPD with chronically increased pulmonary interstitial markings likely reflecting the sequelae of previous infection. There is no alveolar pneumonia nor evidence of CHF.  Review of Systems  Constitutional: Negative for fever, chills and unexpected weight change.  Respiratory: Negative for cough and shortness of breath.   Gastrointestinal: Negative for diarrhea and constipation.  Genitourinary: Negative for difficulty urinating.      Objective:   Physical Exam  Constitutional: She appears well-developed and well-nourished.  HENT:  Mouth/Throat: No oropharyngeal exudate.  Eyes: EOM are normal. Pupils are equal, round, and reactive to light.  Neck: Neck  supple.  Cardiovascular: Normal rate, regular rhythm and normal heart sounds.   Pulmonary/Chest: Effort normal and breath sounds normal.  Abdominal: Soft. Bowel sounds are normal. She exhibits no distension. There is no tenderness.  Lymphadenopathy:    She has no cervical adenopathy.          Assessment & Plan:

## 2014-03-11 NOTE — Assessment & Plan Note (Signed)
She is doing well, breathing is improved. Greatly appreciate CCM f/u. Will watch to see if she needs repeat therapy.

## 2014-03-17 ENCOUNTER — Telehealth: Payer: Self-pay | Admitting: Internal Medicine

## 2014-03-17 NOTE — Telephone Encounter (Signed)
Results received and placed on CY cart to address.

## 2014-03-17 NOTE — Telephone Encounter (Signed)
[facility] lab called with Critical lab results on patient's sputum culture.  Final Results are : positive mycobacterium avium Results are being faxed to triage.  Will send to Dr Annamaria Boots to make aware and will also hold in triage until results are received.

## 2014-03-17 NOTE — Telephone Encounter (Signed)
lmomtcb x1 for pt 

## 2014-03-17 NOTE — Telephone Encounter (Signed)
We can let her know that her sputum culture from 01/04/14 has again grown out the same [facility] atypical AFB bacteria. She will continue to follow with Dr Johnnye Sima at Infectious Disease for management of this.

## 2014-03-18 NOTE — Telephone Encounter (Signed)
Spoke with pt and advised of sputum results per Dr Annamaria Boots.  Pt states she had f/u with Dr Johnnye Sima last week.

## 2014-03-18 NOTE — Telephone Encounter (Signed)
Pt returned call. Please call back at [phone]

## 2014-03-26 ENCOUNTER — Telehealth: Payer: Self-pay | Admitting: Licensed Clinical Social Worker

## 2014-03-26 NOTE — Telephone Encounter (Signed)
I need the sensi report from pulmonary office (as noted in micro section).  Or she can come in for visit early next week thanks

## 2014-03-26 NOTE — Telephone Encounter (Signed)
Per Dr. Johnnye Sima stop the Minocycline and continue the bactrim.

## 2014-03-26 NOTE — Telephone Encounter (Signed)
Patient is having nausea since starting the Minocycline and Bactrim, also has a red and irritated tongue x 1 week. Patient has tried medication with and without food and she still has nausea. Please advise

## 2014-03-29 ENCOUNTER — Encounter: Payer: Self-pay | Admitting: Infectious Diseases

## 2014-03-29 ENCOUNTER — Ambulatory Visit (INDEPENDENT_AMBULATORY_CARE_PROVIDER_SITE_OTHER): Payer: Commercial Managed Care - HMO | Admitting: Infectious Diseases

## 2014-03-29 VITALS — BP 146/75 | HR 73 | Temp 97.9°F | Wt 142.0 lb

## 2014-03-29 DIAGNOSIS — A430 Pulmonary nocardiosis: Secondary | ICD-10-CM | POA: Diagnosis not present

## 2014-03-29 NOTE — Progress Notes (Signed)
   Subjective:    Patient ID: [name], female    DOB: November 04, 1940, 74 y.o.   MRN: [id number]  HPI 74 yo F with hx of COPD, no hx of smoking, has been followed by Dr Annamaria Boots for hemoptysis. She has been found to have MAI on sputum Cx (09-12-12). She had CT 09/12/12 :  1. 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 The disease appears slightly  progressed compared to the prior study, as discussed above.  2. Extensive air trapping in the lungs bilaterally.  3. No findings to suggest an interstitial lung disease at this  time.  4. Atherosclerosis, including left anterior descending coronary  artery disease.  On 03/06/13 she was started on azithro 500 mg TIW, EMB 1200 TIW, Rif 300 mg TIW. Has had one episode of heavy hemoptysis since (in January). Believes she has had hemoptysis for 10 yrs.   Was seen by pulm 12-2013 and had CXR slightly better. 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  Has been feeling well. Has been off E/R/A for her MAI since 03-06-14.  Had episodes of hemoptysis the week of January 4/5th, 2016.  Had f/u CXR 02-23-14: COPD with chronically increased pulmonary interstitial markings likely reflecting the sequelae of previous infection. There is no alveolar pneumonia nor evidence of CHF.  She was seen in ID 03-11-14 and was started on minocycline/bactrim. She had difficulty with oral redness and rawness, and minocycline was stopped on 03-26-14. She still feels that her mouth is sore. Has had some nausea as well, this has improved with taking medicine with food.  Still coughing, less. Still has some yellow sputum.   Review of Systems  Constitutional: Negative for fever, chills, appetite change and unexpected weight change.  HENT: Positive for mouth sores.   Respiratory: Positive for cough.        Objective:   Physical Exam  Constitutional: She appears well-developed and well-nourished.  Eyes: EOM are normal. Pupils are equal, round, and reactive to light.  Cardiovascular: Normal rate, regular rhythm and normal heart sounds.   Pulmonary/Chest: Effort normal. She has rhonchi.  Abdominal: Soft. Bowel sounds are normal. She exhibits no distension.          Assessment & Plan:

## 2014-03-29 NOTE — Assessment & Plan Note (Signed)
Will try to continue her on mono-therapy. I offered to start a second drug for her but she is hesitant to start this now.  Will continue to watch her mouth. Offered to start her on "magic mouth wash" for her soreness if this does not improve. She may try swish and spit maalox in the intervening.  Will see her back in 2 months.

## 2014-03-30 DIAGNOSIS — H4011X1 Primary open-angle glaucoma, mild stage: Secondary | ICD-10-CM | POA: Diagnosis not present

## 2014-04-02 LAB — REFERRED ASSAY

## 2014-04-07 ENCOUNTER — Encounter: Payer: Self-pay | Admitting: Internal Medicine

## 2014-05-31 ENCOUNTER — Ambulatory Visit: Payer: Commercial Managed Care - HMO | Admitting: Infectious Diseases

## 2014-06-07 ENCOUNTER — Ambulatory Visit (INDEPENDENT_AMBULATORY_CARE_PROVIDER_SITE_OTHER): Payer: Commercial Managed Care - HMO | Admitting: Infectious Diseases

## 2014-06-07 ENCOUNTER — Encounter: Payer: Self-pay | Admitting: Infectious Diseases

## 2014-06-07 VITALS — BP 127/67 | HR 78 | Temp 97.5°F | Ht 67.0 in | Wt 142.4 lb

## 2014-06-07 DIAGNOSIS — A430 Pulmonary nocardiosis: Secondary | ICD-10-CM | POA: Diagnosis not present

## 2014-06-07 LAB — COMPREHENSIVE METABOLIC PANEL
ALBUMIN: 3.9 g/dL (ref 3.5–5.2)
ALT: 18 U/L (ref 0–35)
AST: 24 U/L (ref 0–37)
Alkaline Phosphatase: 95 U/L (ref 39–117)
BILIRUBIN TOTAL: 0.2 mg/dL (ref 0.2–1.2)
BUN: 20 mg/dL (ref 6–23)
CALCIUM: 9.1 mg/dL (ref 8.4–10.5)
CHLORIDE: 101 meq/L (ref 96–112)
CO2: 27 meq/L (ref 19–32)
CREATININE: 1.03 mg/dL (ref 0.50–1.10)
GLUCOSE: 93 mg/dL (ref 70–99)
Potassium: 4.2 mEq/L (ref 3.5–5.3)
Sodium: 135 mEq/L (ref 135–145)
Total Protein: 6.8 g/dL (ref 6.0–8.3)

## 2014-06-07 LAB — CBC WITH DIFFERENTIAL/PLATELET
BASOS ABS: 0.1 10*3/uL (ref 0.0–0.1)
Basophils Relative: 1 % (ref 0–1)
EOS ABS: 0.3 10*3/uL (ref 0.0–0.7)
EOS PCT: 4 % (ref 0–5)
HCT: 41 % (ref 36.0–46.0)
Hemoglobin: 13.7 g/dL (ref 12.0–15.0)
LYMPHS ABS: 1.6 10*3/uL (ref 0.7–4.0)
Lymphocytes Relative: 25 % (ref 12–46)
MCH: 32.2 pg (ref 26.0–34.0)
MCHC: 33.4 g/dL (ref 30.0–36.0)
MCV: 96.2 fL (ref 78.0–100.0)
MPV: 9.9 fL (ref 8.6–12.4)
Monocytes Absolute: 0.7 10*3/uL (ref 0.1–1.0)
Monocytes Relative: 10 % (ref 3–12)
Neutro Abs: 3.9 10*3/uL (ref 1.7–7.7)
Neutrophils Relative %: 60 % (ref 43–77)
Platelets: 250 10*3/uL (ref 150–400)
RBC: 4.26 MIL/uL (ref 3.87–5.11)
RDW: 14 % (ref 11.5–15.5)
WBC: 6.5 10*3/uL (ref 4.0–10.5)

## 2014-06-07 MED ORDER — SULFAMETHOXAZOLE-TRIMETHOPRIM 800-160 MG PO TABS: 1.0000 | ORAL_TABLET | Freq: Two times a day (BID) | ORAL | Status: DC

## 2014-06-07 NOTE — Progress Notes (Signed)
   Subjective:    Patient ID: [name], female    DOB: Apr 22, 1940, 74 y.o.   MRN: [id number]  HPI 74 yo F with hx of COPD, no hx of smoking, has been followed by Dr Annamaria Boots for hemoptysis. She has been found to have MAI on sputum Cx (09-12-12). She had CT 09/12/12 :  1. 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 The disease appears slightly  progressed compared to the prior study, as discussed above.  2. Extensive air trapping in the lungs bilaterally.  3. No findings to suggest an interstitial lung disease at this  time.  4. Atherosclerosis, including left anterior descending coronary  artery disease.  On 03/06/13 she was started on azithro 500 mg TIW, EMB 1200 TIW, Rif 300 mg TIW. Has had one episode of heavy hemoptysis since (in January). Believes she has had hemoptysis for 10 yrs.   Was seen by pulm 12-2013 and had CXR slightly better. 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  Has been feeling well. Has been off E/R/A for her MAI since 03-06-14.  Had episodes of hemoptysis the week of January 4/5th, 2016.  Had f/u CXR 02-23-14: COPD with chronically increased pulmonary interstitial markings likely reflecting the sequelae of previous infection. There is no alveolar pneumonia nor evidence of CHF.  She was seen in ID 03-11-14 and was started on minocycline/bactrim. She had difficulty with oral redness and rawness, and minocycline was stopped on 03-26-14. She was left on bactrim mono-therapy (was offered second drug but she refused).   Has done "really well this spring". Has not felt unwell. Coughing has been "pretty good". Still some dark material, every AM. Has mild DOE which is improved with her inhaler.  No f/c.  Review of Systems  Constitutional: Negative for fever and chills.  Respiratory: Positive for cough and  shortness of breath.   Gastrointestinal: Negative for diarrhea and constipation.       Objective:   Physical Exam  Constitutional: She appears well-developed and well-nourished.  HENT:  Mouth/Throat: No oropharyngeal exudate.  Eyes: EOM are normal. Pupils are equal, round, and reactive to light.  Neck: Neck supple.  Cardiovascular: Normal rate, regular rhythm and normal heart sounds.   Pulmonary/Chest: Effort normal.  Mild bilateral rhonchi.   Abdominal: Soft. Bowel sounds are normal. She exhibits no distension. There is no tenderness.  Lymphadenopathy:    She has no cervical adenopathy.          Assessment & Plan:

## 2014-06-07 NOTE — Assessment & Plan Note (Signed)
She has been getting monotherapy after being intolerant of mino/bactrim. Will continue her on bactrim alone, plan for 12 months of therapy (she is currently at month 3).  Will check her CMP and CBC today.  She seems to be tolerating anbx well.  rtc 6 months.

## 2014-06-29 ENCOUNTER — Ambulatory Visit (INDEPENDENT_AMBULATORY_CARE_PROVIDER_SITE_OTHER): Payer: Commercial Managed Care - HMO | Admitting: Internal Medicine

## 2014-06-29 ENCOUNTER — Ambulatory Visit: Payer: Commercial Managed Care - HMO | Admitting: Internal Medicine

## 2014-06-29 ENCOUNTER — Encounter: Payer: Self-pay | Admitting: Internal Medicine

## 2014-06-29 ENCOUNTER — Other Ambulatory Visit: Payer: Commercial Managed Care - HMO

## 2014-06-29 VITALS — BP 104/60 | HR 75 | Ht 67.0 in | Wt 140.6 lb

## 2014-06-29 DIAGNOSIS — A430 Pulmonary nocardiosis: Secondary | ICD-10-CM | POA: Diagnosis not present

## 2014-06-29 DIAGNOSIS — J479 Bronchiectasis, uncomplicated: Secondary | ICD-10-CM

## 2014-06-29 DIAGNOSIS — J439 Emphysema, unspecified: Secondary | ICD-10-CM

## 2014-06-29 NOTE — Assessment & Plan Note (Signed)
She continues monotherapy with Bactrim under guidance and supervision by Dr. Johnnye Sima and is noticing only a little morning cough now. She is educated to report symptoms suggesting exacerbation or new problem.

## 2014-06-29 NOTE — Progress Notes (Signed)
Patient ID: [name], female    DOB: 10-13-1940, 74 y.o.   MRN: [id number]  HPI 08/01/10- 59 yo F never smoker with hx bronchitis and chronic recurrent hemoptysis. Referred courtesy of Dr Jonathon Jordan. Has noted blood from mouth, mostly at night after she has been asleep, intermittently since at least 2003. Previous ENT eval by Dr Edison Nasuti, Bronchoscopy by Dr Nils Pyle as well as pulmonary evaluation by Dr Gwenette Greet. No source found. She put herself on aspirin as a self-imposed health measure in April and noted onset of bleeding again about a month after that. Self-limited then and no recurrence since.. Denies fever, sweat, nodes, weight loss or chest pain.  CT chest 08/20/03 - advanced COPD with bronchiectasis and blebs.  Lived in [place] for 5 months in 2005- bleeding preceded that trip. Remote pneumonia. PPD negative.  08/30/10- 10 yo F never smoker with hx bronchitis and chronic recurrent hemoptysis. CT 08/01/10- suggests MAIC with scattered nodularity and bronchiectasis. Some improvement since 12/09/07, but some new areas. Images reviewed with her.  She denies any change. Coughs a little, intermittently on first rising, scant phlegm. Denies fever or night sweats. Weight fluctuates in her range.  No heme since last here.   03/14/11- 68 yo F never smoker with hx bronchitis and chronic recurrent hemoptysis, ? MAIC by CT. PCP Dr Jonathon Jordan She chose not to have flu shot. Describes heavy exposure to secondhand smoke while growing up. Has not had further hemoptysis. Now resolving acute bronchitis the began with sinusitis right after Christmas. Over-the-counter meds helped. January 5, bad cough [facility] Chest x-ray done, diagnosed viral bronchitis on COPD and treated with antibiotic and prednisone. She then went to [facility] to stay at her sister's house. While there she went to an urgent care in [place] on January 11 with a repeat chest x-ray and similar  medications. She seemed better but went back to urgent care at  [facility] and was sent to the emergency room. Diagnosed with pneumonia on CT scan at [facility]. Augmentin caused nausea, switched to Z-Pak with prednisone. Now off all antibiotics and prednisone. Using rescue inhaler occasionally. Still some cough with scant clear mucus and rhinorrhea. Some dyspnea on exertion on hills and stairs. No fever or sweating, chest pain or palpitation.  08/22/11- 53 yo F never smoker with hx bronchitis and chronic recurrent hemoptysis, ? MAIC by CT. PCP Dr Jonathon Jordan c/o sob with exertion otherwise doing good.  Last imaging was CT scan at [facility] January 2013. Did finally get flu vaccine in January. Does not go outside very much. Keeping a 53-year-old granddaughter and we discussed exposure to colds. Little cough. One episode of streaky hemoptysis in April. Denies sweats, fever, adenopathy, chest pain, palpitation.  03/14/12- 55 yo F never smoker with hx bronchitis and chronic recurrent hemoptysis, ? MAIC by CT. PCP Dr Jonathon Jordan FOLLOWS FOR: cough at times-slightly productive-green in color. Denies any wheezing,SOB, or congestion. History of recurrent bronchitis and pneumonias. PFT 2012-air trapping. a1AT 08/08/10- Nl MM CXR 09/11/11-reviewed. Significant chronic scarring. IMPRESSION:  No active disease. Stable hyperinflation and chronic interstitial  prominence.  Original Report Authenticated By: Lahoma Crocker, M.D.  09/12/12-71 yo F never smoker with hx bronchitis and chronic recurrent hemoptysis, ? MAIC by CT. PCP Dr Jonathon Jordan FOLLOWS FOR:08-20-12 got sick while at beach and went to [facility] on 08-22-12. CXR done-disc brought today. Had felt better for a while afterwards up until this week-not  as bad as july 4th visit; runny nose, cough, chest congestion, back hurts (upper) form coughing so much. Was given Tessalon Rx for cough by Dr Stephanie Acre on July 8th. Acute  infections in March, July and again in last 4 days starting like colds, with low grade fever, malaise, head then chest congestion. Little fever/ chill. No blood and no GI. We reviewed images of CXR from July 4 disk showing thin chest, bronchiectatic type scarring.  Managing as AECOPD but will give doxy, try for sputum cx, check immunoglobulins. Consider Daliresp nex time.  CT chest - 03/18/12-  IMPRESSION:  No significant radiographic change in the chest since July 2013.  Peribronchial thickening bilaterally with slight reticulonodular  pattern in the right lung and bilateral areas of bronchiectasis.  Given findings on the chest CT of June 2012, the possibility of a  chronic persistent Mycobacterium avium complex infection cannot be  excluded.  Original Report Authenticated By: Curlene Dolphin, M.D.  12/12/12- 89 yo F never smoker with hx bronchitis and chronic recurrent hemoptysis, + MAIC. PCP Dr Jonathon Jordan SPUTUM 09/12/12 POS MAIC FOLLOWS FOR:  Breathing is unchanged.  At times experiencing wheezing DOE with brisk walk/ on hills. No routine cough. Last antibiotic slowly cleared acute bronchitis after last visit. Denies sweats, fever, nodes. Has had hemoptysis off and on in the past attributed to chronic bronchitis. Immunoglobulins were okay     She wants to defer treatment for [facility], following for now. CT 09/12/12 IMPRESSION:  1. 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 The disease appears slightly  progressed compared to the prior study, as discussed above.  2. Extensive air trapping in the lungs bilaterally.  3. No findings to suggest an interstitial lung disease at this  time.  4. Atherosclerosis, including left anterior descending coronary  artery disease.  Original Report Authenticated By: Vinnie Langton, M.D.  02/10/13- 46 yo F never smoker with hx bronchitis and  chronic recurrent hemoptysis, + MAIC 09/12/12. Rx: 02/10/13- zith 500 mg TIW, EMB 1200 TIW, Rif 300 mg TIW PCP Dr Jonathon Jordan Much cough but it not as much yellow.  Produces sputum once or twice a day. Feels okay and denies fever, night sweats or blood recently. Using Tessalon Perles up to 3 daily. CXR 02/10/13-  IMPRESSION:  Chronic infiltrates in right lung question related to chronic MAI  infection versus scarring.  Underlying emphysema/chronic bronchitic changes with biapical  scarring.  No acute abnormalities.  Electronically Signed  By: Lavonia Dana M.D.  On: 02/10/2013 09:33  06/25/13-  89 yo F never smoker with hx bronchitis/ bronchiectasis and chronic recurrent hemoptysis, + MAIC 09/12/12.complicated by glaucoma, hx sinusitis Rx: 02/10/13- zith 500 mg TIW, EMB 1200 TIW, Rif 300 mg TIW Follows for: Pt says cough has improved since last visit. C.o SOB with exertion. Continues on triple Rx being followed by Dr Johnnye Sima at [facility] clinic. Less morning cough. Last hemoptysis was late March. Some dyspnea on exertion. Drugs are well tolerated. Diagnosed with glaucoma  11/101/15- 66 yo F never smoker with hx bronchitis/ bronchiectasis and chronic recurrent hemoptysis, + MAIC 09/12/12.complicated by glaucoma, hx sinusitis Rx: 02/10/13- zith 500 mg TIW, EMB 1200 TIW, Rif 300 mg TIW followed by I.D. FOLLOWS FOR: Denies any complaints today. Still taking abx regimen for [facility] reports good tolerance with meds. Recent cough productive of thick yellow sputum2 weeks with no sweats or obvious fever, blood or adenopathy.  CXR 06/25/13 IMPRESSION: Since 02/10/2013, slight improvement in right lower lobe aeration. Otherwise, similar appearance of right greater than left pulmonary parenchymal findings which are most consistent with atypical infection, likely mycobacterium avium intracellulare. Electronically Signed  By: Abigail Miyamoto M.D.  On: 06/25/2013 15:45  06/29/14-72 yo F never smoker with hx  bronchitis/ bronchiectasis and chronic recurrent hemoptysis, + [facility] 09/12/12.complicated by Nocardia,  glaucoma, hx sinusitis Rx: 02/10/13- zith 500 mg TIW, EMB 1200 TIW, Rif 300 mg TIW. Ended by ID. Nocardia Rx'd Bactrim x 1 year FOLLOWS FOR: Pt states she has been SOB; had to use rescue inhaler 2 times recently. She has felt more "tired" since spending time caring for Reggie Welge and active granddaughter. Not really short of breath. PFT in 2012 had shown small airway obstructive disease with an early emphysema pattern. She grew up with secondhand smoke but never smoker self. Father had emphysema. Morning cough with transient dark sputum then clear for the rest of the day. She denies night sweats, adenopathy, hemoptysis. CXR 02/23/14 IMPRESSION: COPD with chronically increased pulmonary interstitial markings likely reflecting the sequelae of previous infection. There is no alveolar pneumonia nor evidence of CHF. Electronically Signed  By: David Martinique  On: 02/23/2014 10:27 Office spirometry 06/29/2014-moderate obstructive airways disease, FVC 2.17/69%, FEV1 1.39/60%, FEV1/FVC 64%, FEF 25-75 percent 0.75/41%.  Review of Systems-see HPI Constitutional:   No-   weight loss, night sweats, fevers, chills, fatigue, lassitude. HEENT:   No-  headaches, difficulty swallowing, tooth/dental problems, sore throat,       No- sneezing, no-itching, ear ache, nasal congestion, post nasal drip,  CV:  No-   chest pain, orthopnea, PND, swelling in lower extremities, anasarca, dizziness, palpitations Resp: +  shortness of breath with exertion or at rest.            +  productive cough,  + non-productive cough,  No- recent coughing up of blood.              + change in color of mucus.  No- wheezing.   Skin: No-   rash or lesions. GI:  No-   heartburn, indigestion, abdominal pain, nausea, vomiting,  GU:  MS:  No-   joint pain or swelling.  . Neuro-     nothing unusual Psych:  No- change in mood or affect. No  depression or anxiety.  No memory loss.  Objective:   Physical Exam General- Alert, Oriented, Affect-cheerful, Distress- none acute. Trim.               Looks well. Skin- rash-none, lesions- none, excoriation- none Lymphadenopathy- none Head- atraumatic            Eyes- Gross vision intact, PERRLA, conjunctivae clear secretions            Ears- Hearing, canals-normal            Nose- Clear, no-Septal dev, mucus, polyps, erosion, perforation             Throat- Mallampati II , mucosa clear , drainage- none, tonsils-                     atrophic Neck- flexible , trachea midline, no stridor , thyroid nl, carotid no bruit Chest - symmetrical excursion , unlabored           Heart/CV- RRR/ occ extra beat , no murmur , no gallop  , no rub, nl s1 s2                           -  JVD- none , edema- none, stasis changes- none, varices- none           Lung- +crackles-minimal R base, cough+dry/ light , dullness-none, rub- none           Chest wall-  Abd-  Br/ Gen/ Rectal- Not done, not indicated Extrem- cyanosis- none, clubbing, none, atrophy- none, strength- nl.  Neuro- grossly intact to observation

## 2014-06-29 NOTE — Assessment & Plan Note (Signed)
Her obstructive airways disease seems to have progressed over the past 4 years. This may be the result of chronic bronchitis from known infections, with bronchiectasis. She is already on bronchodilators which we discussed Plan-alpha-1 antitrypsin assay to exclude genetic predisposition towards emphysema. Remain active to maintain stamina.

## 2014-06-29 NOTE — Patient Instructions (Signed)
Order- Office spirometry    Dx bronchiectasis without exacerbation  Order- lab  a1AT     Dx emphysema, unspecified  If you keep feeling the shortness of breath, try using the Advair 1 puff, then rinse mouth, twice daily for at least a couple of weeks. See if that makes a difference.Marland Kitchen

## 2014-07-05 LAB — ALPHA-1 ANTITRYPSIN PHENOTYPE: A1 ANTITRYPSIN: 170 mg/dL (ref 83–199)

## 2014-07-29 ENCOUNTER — Other Ambulatory Visit: Payer: Self-pay

## 2014-07-29 DIAGNOSIS — Z1231 Encounter for screening mammogram for malignant neoplasm of breast: Secondary | ICD-10-CM

## 2014-09-02 ENCOUNTER — Ambulatory Visit
Admission: RE | Admit: 2014-09-02 | Discharge: 2014-09-02 | Disposition: A | Discharge status: Home / Self Care | Payer: Commercial Managed Care - HMO | Source: Ambulatory Visit

## 2014-09-02 DIAGNOSIS — Z1231 Encounter for screening mammogram for malignant neoplasm of breast: Secondary | ICD-10-CM | POA: Diagnosis not present

## 2014-09-28 DIAGNOSIS — H4011X1 Primary open-angle glaucoma, mild stage: Secondary | ICD-10-CM | POA: Diagnosis not present

## 2014-09-28 DIAGNOSIS — H2513 Age-related nuclear cataract, bilateral: Secondary | ICD-10-CM | POA: Diagnosis not present

## 2014-11-29 ENCOUNTER — Encounter: Payer: Self-pay | Admitting: Infectious Diseases

## 2014-11-29 ENCOUNTER — Ambulatory Visit (INDEPENDENT_AMBULATORY_CARE_PROVIDER_SITE_OTHER): Payer: Commercial Managed Care - HMO | Admitting: Infectious Diseases

## 2014-11-29 VITALS — BP 163/70 | HR 89 | Temp 97.4°F | Wt 146.0 lb

## 2014-11-29 DIAGNOSIS — J479 Bronchiectasis, uncomplicated: Secondary | ICD-10-CM | POA: Diagnosis not present

## 2014-11-29 DIAGNOSIS — I10 Essential (primary) hypertension: Secondary | ICD-10-CM | POA: Diagnosis not present

## 2014-11-29 HISTORY — DX: Essential (primary) hypertension: I10

## 2014-11-29 NOTE — Assessment & Plan Note (Signed)
She is doing well. She has minimal sputum, continued cough.  We discussed stopping her bactrim at 1 year.  She will return in April after stopping her bactrim in January.  If she has recucrence of sx, will have her eval by Dr Lake Bells for inhaled aminoglycoside study.

## 2014-11-29 NOTE — Progress Notes (Signed)
   Subjective:    Patient ID: [name], female    DOB: 1940-03-08, 74 y.o.   MRN: [id number]  HPI 74 yo F with hx of COPD, no hx of smoking, has been followed by Dr Annamaria Boots for hemoptysis. She has been found to have MAI on sputum Cx (09-12-12). She had CT 09/12/12 :  1. 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 The disease appears slightly  progressed compared to the prior study, as discussed above.  2. Extensive air trapping in the lungs bilaterally.  3. No findings to suggest an interstitial lung disease at this  time.  4. Atherosclerosis, including left anterior descending coronary  artery disease.  On 03/06/13 she was started on azithro 500 mg TIW, EMB 1200 TIW, Rif 300 mg TIW. Has had one episode of heavy hemoptysis since (in January). Believes she has had hemoptysis for 10 yrs.   Was seen by pulm 12-2013 and had CXR slightly better. 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  Has been feeling well. Has been off E/R/A for her MAI since 03-06-14.  Had episodes of hemoptysis the week of January 4/5th, 2016.  Had f/u CXR 02-23-14: COPD with chronically increased pulmonary interstitial markings likely reflecting the sequelae of previous infection. There is no alveolar pneumonia nor evidence of CHF.  She was seen in ID 03-11-14 and was started on minocycline/bactrim. She had difficulty with oral redness and rawness, and minocycline was stopped on 03-26-14. She was left on bactrim mono-therapy (was offered second drug but she refused).   Has been busy taking care of grandkids.  Has been feeling "really good". Has been on bactrim since January- concerned that 1) can this be cured? 2) could it come back after treatment? Still has rarre cough, minimal sputum prod.   no f/c.  Wt has been slightly up. BP has been normal at home.   Using inhaler less this month (increased last month)   Review of Systems  Constitutional: Positive for unexpected weight change. Negative for fever, chills and appetite change.  Respiratory: Positive for cough. Negative for shortness of breath.   Cardiovascular: Negative for chest pain and leg swelling.  Gastrointestinal: Negative for diarrhea and constipation.  Genitourinary: Negative for difficulty urinating.  Neurological: Negative for headaches.       Objective:   Physical Exam  Constitutional: She appears well-developed.  HENT:  Mouth/Throat: No oropharyngeal exudate.  Eyes: EOM are normal. Pupils are equal, round, and reactive to light.  Neck: Neck supple.  Cardiovascular: Normal rate, regular rhythm and normal heart sounds.   Pulmonary/Chest: Effort normal. She has rhonchi.  Abdominal: Soft. Bowel sounds are normal. There is no tenderness. There is no rebound.  Musculoskeletal: She exhibits no edema.  Lymphadenopathy:    She has no cervical adenopathy.       Assessment & Plan:

## 2014-11-29 NOTE — Assessment & Plan Note (Signed)
She has f/u appt with her PCP in the next month

## 2014-12-30 ENCOUNTER — Ambulatory Visit: Payer: Commercial Managed Care - HMO | Admitting: Internal Medicine

## 2015-02-01 DIAGNOSIS — I272 Other secondary pulmonary hypertension: Secondary | ICD-10-CM | POA: Diagnosis not present

## 2015-02-01 DIAGNOSIS — N3281 Overactive bladder: Secondary | ICD-10-CM | POA: Diagnosis not present

## 2015-02-01 DIAGNOSIS — H409 Unspecified glaucoma: Secondary | ICD-10-CM | POA: Diagnosis not present

## 2015-02-01 DIAGNOSIS — J449 Chronic obstructive pulmonary disease, unspecified: Secondary | ICD-10-CM | POA: Diagnosis not present

## 2015-02-01 DIAGNOSIS — Z0000 Encounter for general adult medical examination without abnormal findings: Secondary | ICD-10-CM | POA: Diagnosis not present

## 2015-02-01 DIAGNOSIS — J479 Bronchiectasis, uncomplicated: Secondary | ICD-10-CM | POA: Diagnosis not present

## 2015-02-01 DIAGNOSIS — I700 Atherosclerosis of aorta: Secondary | ICD-10-CM | POA: Diagnosis not present

## 2015-02-01 DIAGNOSIS — A310 Pulmonary mycobacterial infection: Secondary | ICD-10-CM | POA: Diagnosis not present

## 2015-02-09 DIAGNOSIS — Z0000 Encounter for general adult medical examination without abnormal findings: Secondary | ICD-10-CM | POA: Diagnosis not present

## 2015-02-15 ENCOUNTER — Encounter: Payer: Self-pay | Admitting: Internal Medicine

## 2015-02-15 ENCOUNTER — Ambulatory Visit (INDEPENDENT_AMBULATORY_CARE_PROVIDER_SITE_OTHER)
Admission: RE | Admit: 2015-02-15 | Discharge: 2015-02-15 | Disposition: A | Discharge status: Home / Self Care | Payer: Commercial Managed Care - HMO | Source: Ambulatory Visit | Attending: Internal Medicine | Admitting: Internal Medicine

## 2015-02-15 ENCOUNTER — Ambulatory Visit: Payer: Commercial Managed Care - HMO | Admitting: Internal Medicine

## 2015-02-15 ENCOUNTER — Other Ambulatory Visit: Payer: Commercial Managed Care - HMO

## 2015-02-15 VITALS — BP 130/66 | HR 95 | Ht 67.0 in | Wt 141.0 lb

## 2015-02-15 DIAGNOSIS — R05 Cough: Secondary | ICD-10-CM | POA: Diagnosis not present

## 2015-02-15 DIAGNOSIS — R0602 Shortness of breath: Secondary | ICD-10-CM | POA: Diagnosis not present

## 2015-02-15 DIAGNOSIS — J479 Bronchiectasis, uncomplicated: Secondary | ICD-10-CM

## 2015-02-15 DIAGNOSIS — J4540 Moderate persistent asthma, uncomplicated: Secondary | ICD-10-CM

## 2015-02-15 DIAGNOSIS — J45909 Unspecified asthma, uncomplicated: Secondary | ICD-10-CM | POA: Diagnosis not present

## 2015-02-15 IMAGING — DX DG CHEST 2V
2 series · 2 of 2 positions shown · non-contrast
Comparison: Prior chest x-ray [DATE] ; prior chest CT
[DATE]

CLINICAL DATA: 74-year-old female with cough, shortness of breath
and wheezing

EXAM:
CHEST  2 VIEW

[chest pa]
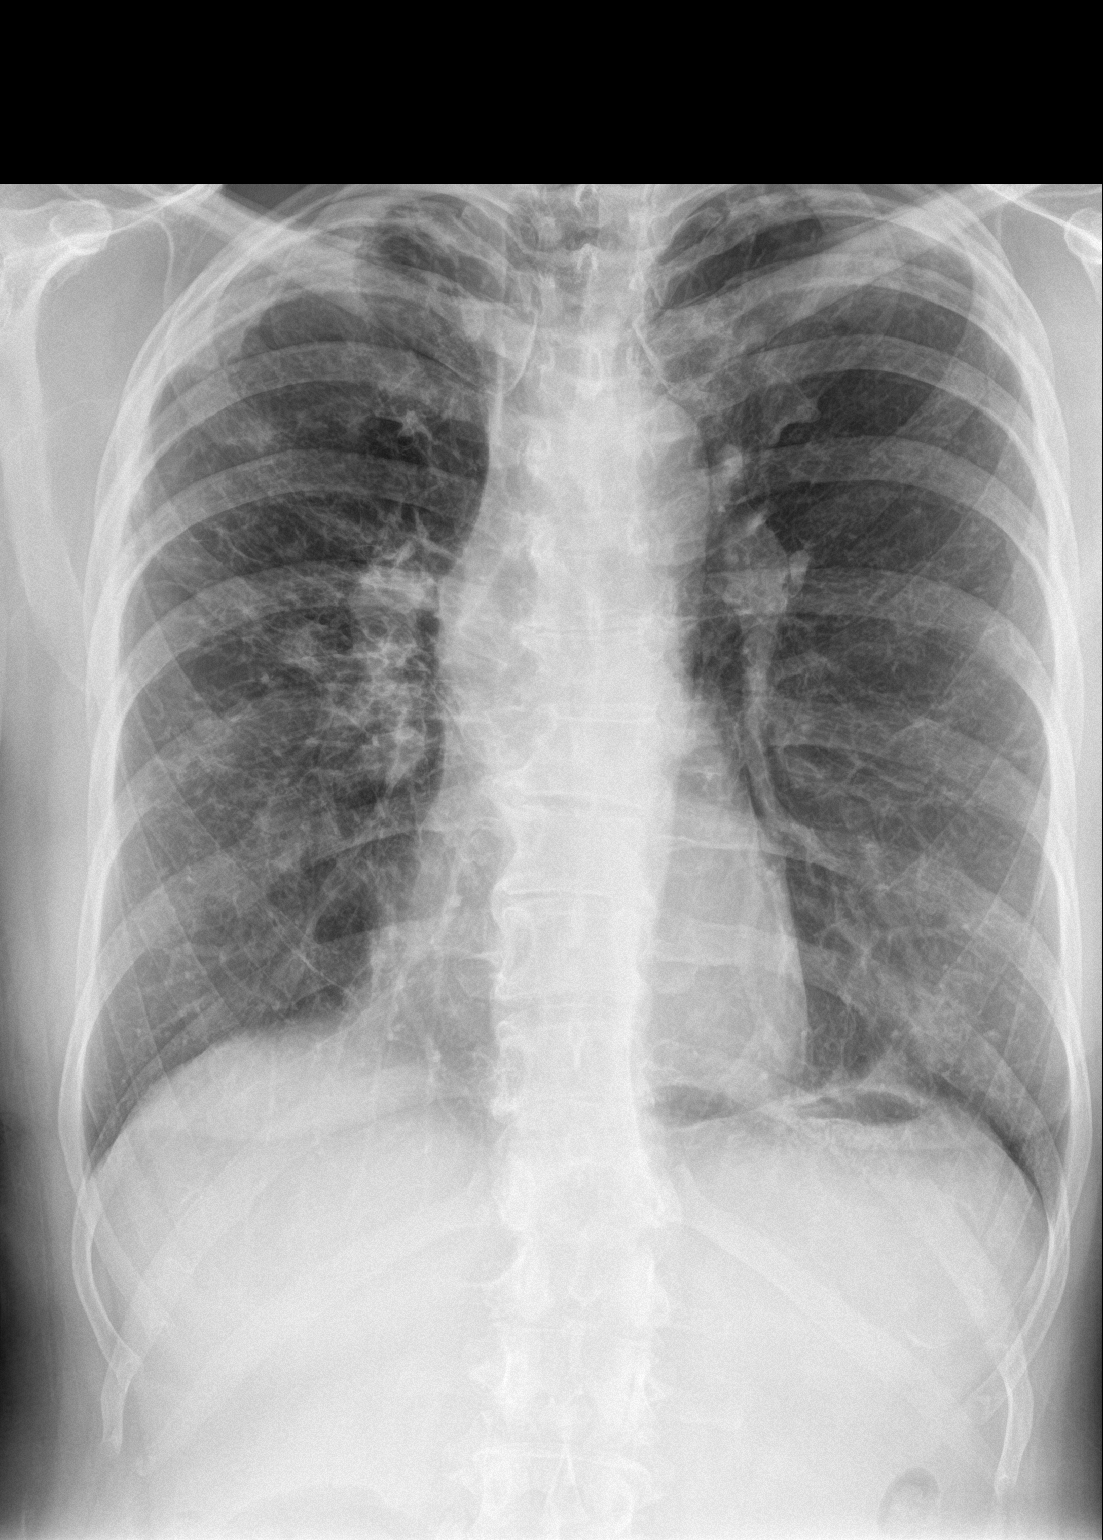

[chest lat]
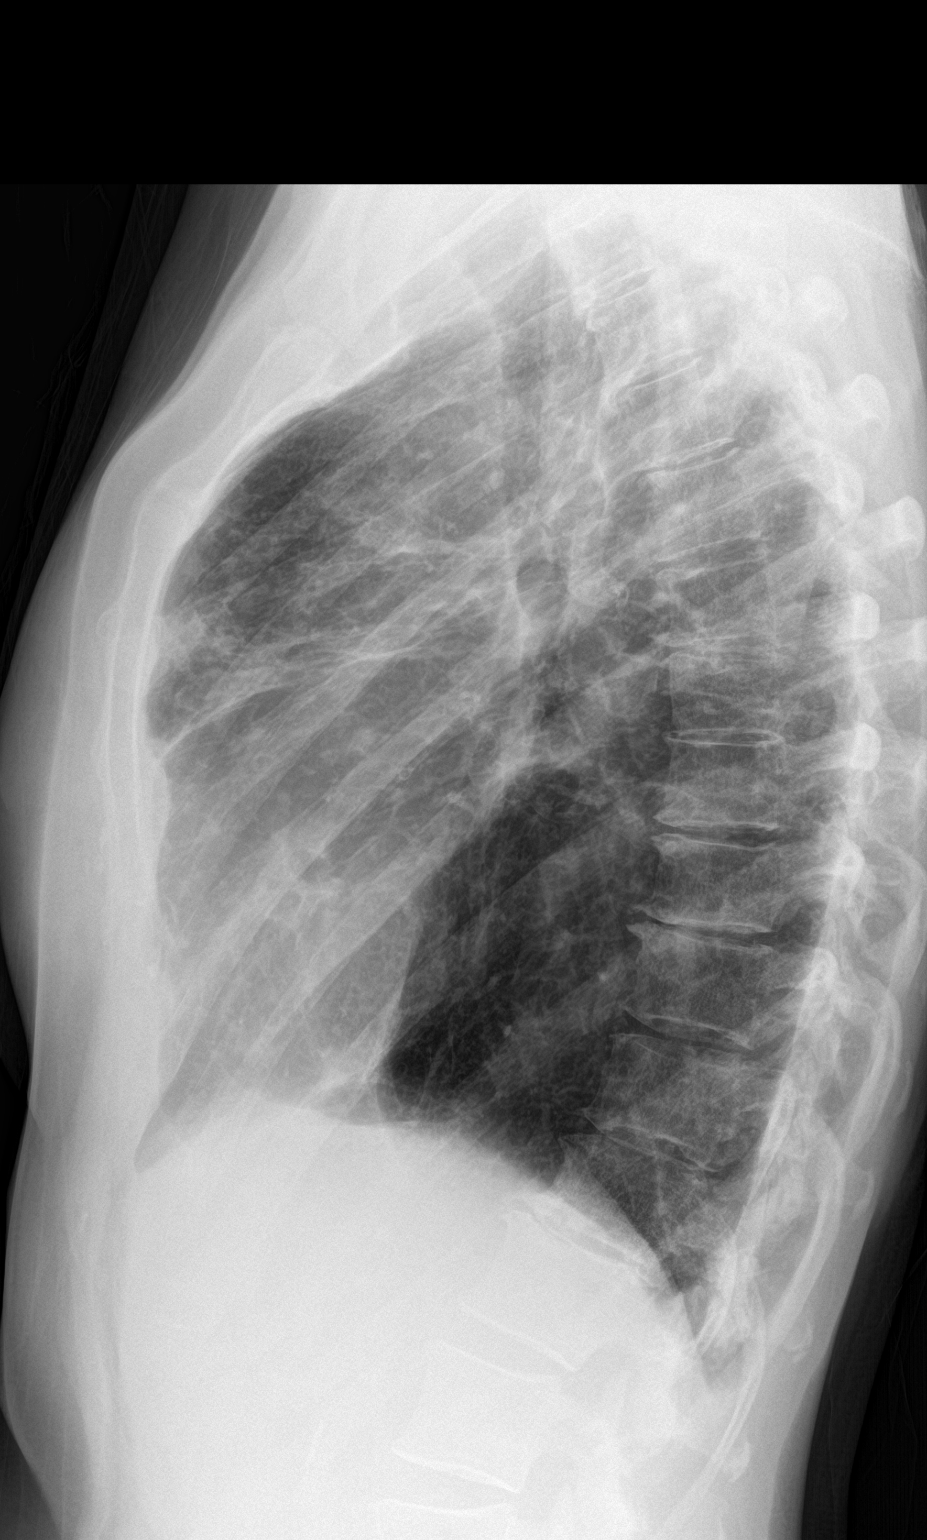

[2 of 2 positions shown; findings below may reference images not displayed]

FINDINGS: Pulmonary hyperinflation with pleural parenchymal scarring and
bronchiectasis in both upper lobes, and of the right middle lobe.
Overall very little change in the appearance of the lungs compared
to [DATE]. No pleural effusion or pneumothorax. Cardiac and
mediastinal contours are within normal limits. No acute osseous
abnormality.
IMPRESSION: 1. Stable chronic pleural parenchymal scarring and bronchiectasis
without evidence of acute cardiopulmonary process.

## 2015-02-15 MED ORDER — ALBUTEROL SULFATE HFA 108 (90 BASE) MCG/ACT IN AERS
2.0000 | INHALATION_SPRAY | Freq: Four times a day (QID) | RESPIRATORY_TRACT | Status: DC | PRN
Start: 2015-02-15 — End: 2015-08-16

## 2015-02-15 MED ORDER — MONTELUKAST SODIUM 10 MG PO TABS: 10.0000 mg | ORAL_TABLET | Freq: Every day | ORAL | Status: DC

## 2015-02-15 MED ORDER — METHYLPREDNISOLONE ACETATE 80 MG/ML IJ SUSP
80.0000 mg | Freq: Once | INTRAMUSCULAR | Status: AC
  Administered 2015-02-15: 80 mg via INTRAMUSCULAR

## 2015-02-15 MED ORDER — FLUTICASONE-SALMETEROL 500-50 MCG/DOSE IN AEPB: INHALATION_SPRAY | RESPIRATORY_TRACT | Status: DC

## 2015-02-15 MED ORDER — LEVALBUTEROL HCL 0.63 MG/3ML IN NEBU
0.6300 mg | INHALATION_SOLUTION | Freq: Once | RESPIRATORY_TRACT | Status: AC
  Administered 2015-02-15: 0.63 mg via RESPIRATORY_TRACT

## 2015-02-15 NOTE — Patient Instructions (Addendum)
Order-lab for Allergy Profile   DX Asthma moderate persistent   prescription sent for Singulair 1 daily  Prescription sent for Advair 500 to use for now   Prescription sent for albuterol HFA rescue inhaler- 2 puffs every 6 hours as needed for wheezing and shortness of breath  Neb treatments Xopenex  Depo-Medrol 80 mg  Order- CXR    Dx bronchiectasis

## 2015-02-15 NOTE — Progress Notes (Signed)
 Patient ID: [name], female    DOB: 10-13-1940, 74 y.o.   MRN: [id number]  HPI 08/01/10- 59 yo F never smoker with hx bronchitis and chronic recurrent hemoptysis. Referred courtesy of Dr Jonathon Jordan. Has noted blood from mouth, mostly at night after she has been asleep, intermittently since at least 2003. Previous ENT eval by Dr Edison Nasuti, Bronchoscopy by Dr Nils Pyle as well as pulmonary evaluation by Dr Gwenette Greet. No source found. She put herself on aspirin as a self-imposed health measure in April and noted onset of bleeding again about a month after that. Self-limited then and no recurrence since.. Denies fever, sweat, nodes, weight loss or chest pain.  CT chest 08/20/03 - advanced COPD with bronchiectasis and blebs.  Lived in [place] for 5 months in 2005- bleeding preceded that trip. Remote pneumonia. PPD negative.  08/30/10- 10 yo F never smoker with hx bronchitis and chronic recurrent hemoptysis. CT 08/01/10- suggests MAIC with scattered nodularity and bronchiectasis. Some improvement since 12/09/07, but some new areas. Images reviewed with her.  She denies any change. Coughs a little, intermittently on first rising, scant phlegm. Denies fever or night sweats. Weight fluctuates in her range.  No heme since last here.   03/14/11- 68 yo F never smoker with hx bronchitis and chronic recurrent hemoptysis, ? MAIC by CT. PCP Dr Jonathon Jordan She chose not to have flu shot. Describes heavy exposure to secondhand smoke while growing up. Has not had further hemoptysis. Now resolving acute bronchitis the began with sinusitis right after Christmas. Over-the-counter meds helped. January 5, bad cough [facility] Chest x-ray done, diagnosed viral bronchitis on COPD and treated with antibiotic and prednisone. She then went to [facility] to stay at her sister's house. While there she went to an urgent care in [place] on January 11 with a repeat chest x-ray and similar  medications. She seemed better but went back to urgent care at  [facility] and was sent to the emergency room. Diagnosed with pneumonia on CT scan at [facility]. Augmentin caused nausea, switched to Z-Pak with prednisone. Now off all antibiotics and prednisone. Using rescue inhaler occasionally. Still some cough with scant clear mucus and rhinorrhea. Some dyspnea on exertion on hills and stairs. No fever or sweating, chest pain or palpitation.  08/22/11- 53 yo F never smoker with hx bronchitis and chronic recurrent hemoptysis, ? MAIC by CT. PCP Dr Jonathon Jordan c/o sob with exertion otherwise doing good.  Last imaging was CT scan at [facility] January 2013. Did finally get flu vaccine in January. Does not go outside very much. Keeping a 53-year-old granddaughter and we discussed exposure to colds. Little cough. One episode of streaky hemoptysis in April. Denies sweats, fever, adenopathy, chest pain, palpitation.  03/14/12- 55 yo F never smoker with hx bronchitis and chronic recurrent hemoptysis, ? MAIC by CT. PCP Dr Jonathon Jordan FOLLOWS FOR: cough at times-slightly productive-green in color. Denies any wheezing,SOB, or congestion. History of recurrent bronchitis and pneumonias. PFT 2012-air trapping. a1AT 08/08/10- Nl MM CXR 09/11/11-reviewed. Significant chronic scarring. IMPRESSION:  No active disease. Stable hyperinflation and chronic interstitial  prominence.  Original Report Authenticated By: Lahoma Crocker, M.D.  09/12/12-71 yo F never smoker with hx bronchitis and chronic recurrent hemoptysis, ? MAIC by CT. PCP Dr Jonathon Jordan FOLLOWS FOR:08-20-12 got sick while at beach and went to [facility] on 08-22-12. CXR done-disc brought today. Had felt better for a while afterwards up until this week-not  as bad as july 4th visit; runny nose, cough, chest congestion, back hurts (upper) form coughing so much. Was given Tessalon Rx for cough by Dr Stephanie Acre on July 8th. Acute  infections in March, July and again in last 4 days starting like colds, with low grade fever, malaise, head then chest congestion. Little fever/ chill. No blood and no GI. We reviewed images of CXR from July 4 disk showing thin chest, bronchiectatic type scarring.  Managing as AECOPD but will give doxy, try for sputum cx, check immunoglobulins. Consider Daliresp nex time.  CT chest - 03/18/12-  IMPRESSION:  No significant radiographic change in the chest since July 2013.  Peribronchial thickening bilaterally with slight reticulonodular  pattern in the right lung and bilateral areas of bronchiectasis.  Given findings on the chest CT of June 2012, the possibility of a  chronic persistent Mycobacterium avium complex infection cannot be  excluded.  Original Report Authenticated By: Curlene Dolphin, M.D.  12/12/12- 89 yo F never smoker with hx bronchitis and chronic recurrent hemoptysis, + MAIC. PCP Dr Jonathon Jordan SPUTUM 09/12/12 POS MAIC FOLLOWS FOR:  Breathing is unchanged.  At times experiencing wheezing DOE with brisk walk/ on hills. No routine cough. Last antibiotic slowly cleared acute bronchitis after last visit. Denies sweats, fever, nodes. Has had hemoptysis off and on in the past attributed to chronic bronchitis. Immunoglobulins were okay     She wants to defer treatment for [facility], following for now. CT 09/12/12 IMPRESSION:  1. 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 The disease appears slightly  progressed compared to the prior study, as discussed above.  2. Extensive air trapping in the lungs bilaterally.  3. No findings to suggest an interstitial lung disease at this  time.  4. Atherosclerosis, including left anterior descending coronary  artery disease.  Original Report Authenticated By: Vinnie Langton, M.D.  02/10/13- 46 yo F never smoker with hx bronchitis and  chronic recurrent hemoptysis, + MAIC 09/12/12. Rx: 02/10/13- zith 500 mg TIW, EMB 1200 TIW, Rif 300 mg TIW PCP Dr Jonathon Jordan Much cough but it not as much yellow.  Produces sputum once or twice a day. Feels okay and denies fever, night sweats or blood recently. Using Tessalon Perles up to 3 daily. CXR 02/10/13-  IMPRESSION:  Chronic infiltrates in right lung question related to chronic MAI  infection versus scarring.  Underlying emphysema/chronic bronchitic changes with biapical  scarring.  No acute abnormalities.  Electronically Signed  By: Lavonia Dana M.D.  On: 02/10/2013 09:33  06/25/13-  89 yo F never smoker with hx bronchitis/ bronchiectasis and chronic recurrent hemoptysis, + MAIC 09/12/12.complicated by glaucoma, hx sinusitis Rx: 02/10/13- zith 500 mg TIW, EMB 1200 TIW, Rif 300 mg TIW Follows for: Pt says cough has improved since last visit. C.o SOB with exertion. Continues on triple Rx being followed by Dr Johnnye Sima at [facility] clinic. Less morning cough. Last hemoptysis was late March. Some dyspnea on exertion. Drugs are well tolerated. Diagnosed with glaucoma  11/101/15- 66 yo F never smoker with hx bronchitis/ bronchiectasis and chronic recurrent hemoptysis, + MAIC 09/12/12.complicated by glaucoma, hx sinusitis Rx: 02/10/13- zith 500 mg TIW, EMB 1200 TIW, Rif 300 mg TIW followed by I.D. FOLLOWS FOR: Denies any complaints today. Still taking abx regimen for [facility] reports good tolerance with meds. Recent cough productive of thick yellow sputum2 weeks with no sweats or obvious fever, blood or adenopathy.  CXR 06/25/13 IMPRESSION: Since 02/10/2013, slight improvement in right lower lobe aeration. Otherwise, similar appearance of right greater than left pulmonary parenchymal findings which are most consistent with atypical infection, likely mycobacterium avium intracellulare. Electronically Signed  By: Abigail Miyamoto M.D.  On: 06/25/2013 15:45  06/29/14-72 yo F never smoker with hx  bronchitis/ bronchiectasis and chronic recurrent hemoptysis, + [facility] 09/12/12.complicated by Nocardia,  glaucoma, hx sinusitis Rx: 02/10/13- zith 500 mg TIW, EMB 1200 TIW, Rif 300 mg TIW. Ended by ID. Nocardia Rx'd Bactrim x 1 year FOLLOWS FOR: Pt states she has been SOB; had to use rescue inhaler 2 times recently. She has felt more "tired" since spending time caring for  and active granddaughter. Not really short of breath. PFT in 2012 had shown small airway obstructive disease with an early emphysema pattern. She grew up with secondhand smoke but never smoker self. Father had emphysema. Morning cough with transient dark sputum then clear for the rest of the day. She denies night sweats, adenopathy, hemoptysis. CXR 02/23/14 IMPRESSION: COPD with chronically increased pulmonary interstitial markings likely reflecting the sequelae of previous infection. There is no alveolar pneumonia nor evidence of CHF. Electronically Signed  By: David Martinique  On: 02/23/2014 10:27 Office spirometry 06/29/2014-moderate obstructive airways disease, FVC 2.17/69%, FEV1 1.39/60%, FEV1/FVC 64%, FEF 25-75 percent 0.75/41%.  02/15/2015-74 year old female never smoker with chronic bronchitis/bronchiectasis, chronic recurrent hemoptysis,  positive MAIC 0000000 complicated by Nocardia, glaucoma, history sinusitis Rx: 02/10/13- zith 500 mg TIW, EMB 1200 TIW, Rif 300 mg TIW. Ended by ID. Nocardia Rx'd Bactrim x 1 year FOLLOWS FOR: pt has had increased SOB, nonprod cough X1 month since taking care of deceased sister's 2 cats.    Review of Systems-see HPI Constitutional:   No-   weight loss, night sweats, fevers, chills, fatigue, lassitude. HEENT:   No-  headaches, difficulty swallowing, tooth/dental problems, sore throat,       No- sneezing, no-itching, ear ache, nasal congestion, post nasal drip,  CV:  No-   chest pain, orthopnea, PND, swelling in lower extremities, anasarca, dizziness, palpitations Resp: +   shortness of breath with exertion or at rest.            +  productive cough,  + non-productive cough,  No- recent coughing up of blood.              + change in color of mucus.  No- wheezing.   Skin: No-   rash or lesions. GI:  No-   heartburn, indigestion, abdominal pain, nausea, vomiting,  GU:  MS:  No-   joint pain or swelling.  . Neuro-     nothing unusual Psych:  No- change in mood or affect. No depression or anxiety.  No memory loss.  Objective:   Physical Exam General- Alert, Oriented, Affect-cheerful, Distress- none acute. Trim. Looks well. Skin- rash-none, lesions- none, excoriation- none Lymphadenopathy- none Head- atraumatic            Eyes- Gross vision intact, PERRLA, conjunctivae clear secretions            Ears- Hearing, canals-normal            Nose- Clear, no-Septal dev, mucus, polyps, erosion, perforation             Throat- Mallampati II , mucosa clear , drainage- none, tonsils-  atrophic Neck- flexible , trachea midline, no stridor , thyroid nl, carotid no bruit Chest - symmetrical excursion , unlabored  Heart/CV- RRR/ occ extra beat , no murmur , no gallop  , no rub, nl s1 s2                           - JVD- none , edema- none, stasis changes- none, varices- none           Lung- +crackles-minimal R base, cough+dry/ light , dullness-none, rub- none           Chest wall-  Abd-  Br/ Gen/ Rectal- Not done, not indicated Extrem- cyanosis- none, clubbing, none, atrophy- none, strength- nl.  Neuro- grossly intact to observation

## 2015-02-16 LAB — ALLERGY FULL PROFILE
Allergen, D pternoyssinus,d7: <0.1 kU/L
Allergen,Goose feathers, e70: <0.1 kU/L
Alternaria Alternata: <0.1 kU/L
Aspergillus fumigatus, m3: <0.1 kU/L
Bermuda Grass: <0.1 kU/L
Candida Albicans: <0.1 kU/L
Common Ragweed: <0.1 kU/L
D. farinae: <0.1 kU/L
Dog Dander: <0.1 kU/L
Fescue: <0.1 kU/L
G005 Rye, Perennial: <0.1 kU/L
G009 Red Top: <0.1 kU/L
House Dust Hollister: <0.1 kU/L
IgE (Immunoglobulin E), Serum: <2 kU/L (ref <115)
Lamb's Quarters: <0.1 kU/L
Oak: <0.1 kU/L
Plantain: <0.1 kU/L
Sycamore Tree: <0.1 kU/L
Timothy Grass: <0.1 kU/L

## 2015-02-23 ENCOUNTER — Telehealth: Payer: Self-pay | Admitting: Internal Medicine

## 2015-02-23 NOTE — Telephone Encounter (Signed)
Notes Recorded by Deneise Lever, MD on 02/16/2015 at 9:14 PM CXR- stable old scarring without any progressive disease noted Notes Recorded by Deneise Lever, MD on 02/16/2015 at 9:16 PM Allergy profile- negative for elevated allergy antibodies against a wide panel of common allergy triggers like pollens, grass, dust. ------------------------- Spoke with pt, aware of results.  Nothing further needed.

## 2015-02-23 NOTE — Telephone Encounter (Signed)
Left message for patient to call back  

## 2015-02-23 NOTE — Telephone Encounter (Signed)
Patient called back, return call at [phone]

## 2015-03-01 ENCOUNTER — Ambulatory Visit: Payer: Self-pay | Admitting: Infectious Diseases

## 2015-03-01 DIAGNOSIS — R0989 Other specified symptoms and signs involving the circulatory and respiratory systems: Secondary | ICD-10-CM | POA: Diagnosis not present

## 2015-03-01 DIAGNOSIS — J441 Chronic obstructive pulmonary disease with (acute) exacerbation: Secondary | ICD-10-CM | POA: Diagnosis not present

## 2015-03-07 ENCOUNTER — Encounter: Payer: Self-pay | Admitting: Infectious Diseases

## 2015-03-07 ENCOUNTER — Telehealth: Payer: Self-pay | Admitting: Internal Medicine

## 2015-03-07 ENCOUNTER — Ambulatory Visit (INDEPENDENT_AMBULATORY_CARE_PROVIDER_SITE_OTHER): Payer: Commercial Managed Care - HMO | Admitting: Infectious Diseases

## 2015-03-07 VITALS — BP 131/76 | HR 86 | Temp 98.1°F | Ht 67.0 in | Wt 138.0 lb

## 2015-03-07 DIAGNOSIS — J441 Chronic obstructive pulmonary disease with (acute) exacerbation: Secondary | ICD-10-CM | POA: Diagnosis not present

## 2015-03-07 DIAGNOSIS — A430 Pulmonary nocardiosis: Secondary | ICD-10-CM | POA: Diagnosis not present

## 2015-03-07 MED ORDER — PREDNISONE 10 MG PO TABS: ORAL_TABLET | ORAL | Status: DC

## 2015-03-07 NOTE — Telephone Encounter (Signed)
Spoke with pt and offered appt-states she "may be better by then" so she will take pred taper and call back if she needs an appt.  Nothing further needed.

## 2015-03-07 NOTE — Telephone Encounter (Signed)
Spoke with pt, aware of recs.  pred taper sent to preferred pharmacy. Routing back to [place] per CY to see where pt can be worked in.  Thanks.

## 2015-03-07 NOTE — Assessment & Plan Note (Signed)
I have asked her to call Dr Annamaria Boots to have repeat course of steroids, consideration of other therapies for her bronchiectasis, COPD.

## 2015-03-07 NOTE — Telephone Encounter (Signed)
Spoke with pt, states she saw Dr. Johnnye Sima with ID today (ov note in Epic), was told she needed to follow up with our office before her currently scheduled visit in 07/2015.  Pt does not want to wait until next available appt on 04/18/2015.    CY please advise if pt can be worked in before this time, or if she can follow up with TP.  Thanks!

## 2015-03-07 NOTE — Progress Notes (Signed)
   Subjective:    Patient ID: [name], female    DOB: 1941-01-27, 75 y.o.   MRN: [id number]  HPI 75 yo F with hx of COPD, no hx of smoking, has been followed by Dr Annamaria Boots for hemoptysis. She has been found to have MAI on sputum Cx (09-12-12). She had CT 09/12/12 :  1. 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 The disease appears slightly  progressed compared to the prior study, as discussed above.  2. Extensive air trapping in the lungs bilaterally.  3. No findings to suggest an interstitial lung disease at this  time.  4. Atherosclerosis, including left anterior descending coronary  artery disease.  On 03/06/13 she was started on azithro 500 mg TIW, EMB 1200 TIW, Rif 300 mg TIW. Has had one episode of heavy hemoptysis since (in January). Believes she has had hemoptysis for 10 yrs.   Was seen by pulm 12-2013 and had CXR slightly better. 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  Has been feeling well. Has been off E/R/A for her MAI since 03-06-14.  Had episodes of hemoptysis the week of January 4/5th, 2016.  Had f/u CXR 02-23-14: COPD with chronically increased pulmonary interstitial markings likely reflecting the sequelae of previous infection. There is no alveolar pneumonia nor evidence of CHF.  She was seen in ID 03-11-14 and was started on minocycline/bactrim. She had difficulty with oral redness and rawness, and minocycline was stopped on 03-26-14. She was left on bactrim mono-therapy (was offered second drug but she refused).  She was planned to stop bactrim this month.  Today complains of sinusitis, bronchitis.  Has been taking multiple OTCs as well as advair, xopenex, steroid injection, levaquin (03-01-15). Advair, ventolin, montelukast.  "I just cant get up this stuff that's in my chest" Denies f/c. No sore  throat.  Had CXR 12-27 that showed no acute change.  Completed prednisone 03-06-15, feels like this has helped more than anything else.  Around parents, second hand smoke. Helping clean her sisters apt, has mold/black dust. Also cats.   Review of Systems  Constitutional: Negative for fever and chills.  HENT: Negative for sore throat.   Eyes: Positive for visual disturbance.  Respiratory: Positive for cough and shortness of breath.   Cardiovascular: Negative for leg swelling.  Gastrointestinal: Negative for diarrhea and constipation.  Genitourinary: Negative for difficulty urinating.      Objective:   Physical Exam  Constitutional: She appears well-developed and well-nourished.  HENT:  Mouth/Throat: No oropharyngeal exudate.  Eyes: EOM are normal. Pupils are equal, round, and reactive to light.  Neck: Neck supple.  Cardiovascular: Normal rate, regular rhythm and normal heart sounds.   Pulmonary/Chest: She has decreased breath sounds. She has rhonchi.  Abdominal: Soft. Bowel sounds are normal. There is no tenderness. There is no rebound.  Musculoskeletal: She exhibits no edema.  Lymphadenopathy:    She has no cervical adenopathy.       Assessment & Plan:

## 2015-03-07 NOTE — Telephone Encounter (Signed)
I reviewed Dr Algis Downs note. Please offer her prednisone 10 mg, # 20, 4 X 2 DAYS, 3 X 2 DAYS, 2 X 2 DAYS, 1 X 2 DAYS Katie will help her with a follow-up appointment in next couple of weeks.

## 2015-03-07 NOTE — Telephone Encounter (Signed)
Pt can be seen Monday 03-21-15 at 11:15am or 4:15pm slot-put in a OV not HFU. Thanks.

## 2015-03-07 NOTE — Assessment & Plan Note (Addendum)
Will stop bactrim now as planned.  Her bronchitis sx predominate at this point.  Will have her f/u with CCM for this.  Will have her back to ID after her current episode resolves.

## 2015-03-09 ENCOUNTER — Ambulatory Visit: Payer: Self-pay | Admitting: Infectious Diseases

## 2015-03-24 DIAGNOSIS — E2839 Other primary ovarian failure: Secondary | ICD-10-CM | POA: Diagnosis not present

## 2015-03-29 DIAGNOSIS — H401122 Primary open-angle glaucoma, left eye, moderate stage: Secondary | ICD-10-CM | POA: Diagnosis not present

## 2015-03-29 DIAGNOSIS — H401111 Primary open-angle glaucoma, right eye, mild stage: Secondary | ICD-10-CM | POA: Diagnosis not present

## 2015-03-29 DIAGNOSIS — H2513 Age-related nuclear cataract, bilateral: Secondary | ICD-10-CM | POA: Diagnosis not present

## 2015-04-27 ENCOUNTER — Ambulatory Visit: Payer: Commercial Managed Care - HMO | Admitting: Licensed Clinical Social Worker

## 2015-05-24 DIAGNOSIS — R634 Abnormal weight loss: Secondary | ICD-10-CM | POA: Diagnosis not present

## 2015-05-24 DIAGNOSIS — Z6821 Body mass index (BMI) 21.0-21.9, adult: Secondary | ICD-10-CM | POA: Diagnosis not present

## 2015-05-24 DIAGNOSIS — J441 Chronic obstructive pulmonary disease with (acute) exacerbation: Secondary | ICD-10-CM | POA: Diagnosis not present

## 2015-05-24 DIAGNOSIS — R5383 Other fatigue: Secondary | ICD-10-CM | POA: Diagnosis not present

## 2015-06-23 DIAGNOSIS — J9611 Chronic respiratory failure with hypoxia: Secondary | ICD-10-CM | POA: Diagnosis not present

## 2015-06-23 DIAGNOSIS — R0902 Hypoxemia: Secondary | ICD-10-CM | POA: Diagnosis not present

## 2015-06-23 DIAGNOSIS — J449 Chronic obstructive pulmonary disease, unspecified: Secondary | ICD-10-CM | POA: Diagnosis not present

## 2015-06-23 DIAGNOSIS — R5383 Other fatigue: Secondary | ICD-10-CM | POA: Diagnosis not present

## 2015-06-23 DIAGNOSIS — J479 Bronchiectasis, uncomplicated: Secondary | ICD-10-CM | POA: Diagnosis not present

## 2015-07-08 DIAGNOSIS — R0902 Hypoxemia: Secondary | ICD-10-CM | POA: Diagnosis not present

## 2015-07-08 DIAGNOSIS — J479 Bronchiectasis, uncomplicated: Secondary | ICD-10-CM | POA: Diagnosis not present

## 2015-07-08 DIAGNOSIS — R5383 Other fatigue: Secondary | ICD-10-CM | POA: Diagnosis not present

## 2015-07-08 DIAGNOSIS — J449 Chronic obstructive pulmonary disease, unspecified: Secondary | ICD-10-CM | POA: Diagnosis not present

## 2015-07-22 ENCOUNTER — Emergency Department (HOSPITAL_COMMUNITY): Payer: Commercial Managed Care - HMO

## 2015-07-22 ENCOUNTER — Emergency Department (HOSPITAL_COMMUNITY)
Admission: EM | Admit: 2015-07-22 | Discharge: 2015-07-22 | Disposition: A | Discharge status: Home / Self Care | Payer: Commercial Managed Care - HMO | Attending: Emergency Medicine | Admitting: Emergency Medicine

## 2015-07-22 ENCOUNTER — Encounter (HOSPITAL_COMMUNITY): Payer: Self-pay | Admitting: *Deleted

## 2015-07-22 DIAGNOSIS — W19XXXA Unspecified fall, initial encounter: Secondary | ICD-10-CM

## 2015-07-22 DIAGNOSIS — E039 Hypothyroidism, unspecified: Secondary | ICD-10-CM | POA: Diagnosis not present

## 2015-07-22 DIAGNOSIS — J449 Chronic obstructive pulmonary disease, unspecified: Secondary | ICD-10-CM | POA: Insufficient documentation

## 2015-07-22 DIAGNOSIS — Z79899 Other long term (current) drug therapy: Secondary | ICD-10-CM | POA: Insufficient documentation

## 2015-07-22 DIAGNOSIS — I10 Essential (primary) hypertension: Secondary | ICD-10-CM | POA: Insufficient documentation

## 2015-07-22 DIAGNOSIS — S59902A Unspecified injury of left elbow, initial encounter: Secondary | ICD-10-CM | POA: Diagnosis present

## 2015-07-22 DIAGNOSIS — S5002XA Contusion of left elbow, initial encounter: Secondary | ICD-10-CM

## 2015-07-22 DIAGNOSIS — Y9289 Other specified places as the place of occurrence of the external cause: Secondary | ICD-10-CM | POA: Insufficient documentation

## 2015-07-22 DIAGNOSIS — W1809XA Striking against other object with subsequent fall, initial encounter: Secondary | ICD-10-CM | POA: Diagnosis not present

## 2015-07-22 DIAGNOSIS — M7989 Other specified soft tissue disorders: Secondary | ICD-10-CM | POA: Diagnosis not present

## 2015-07-22 DIAGNOSIS — Y999 Unspecified external cause status: Secondary | ICD-10-CM | POA: Diagnosis not present

## 2015-07-22 DIAGNOSIS — S79911A Unspecified injury of right hip, initial encounter: Secondary | ICD-10-CM | POA: Diagnosis not present

## 2015-07-22 DIAGNOSIS — Y939 Activity, unspecified: Secondary | ICD-10-CM | POA: Diagnosis not present

## 2015-07-22 IMAGING — DX DG HIP (WITH OR WITHOUT PELVIS) 2-3V*R*
3 series · 3 of 3 positions shown · non-contrast
Comparison: No prior .

CLINICAL DATA: Fall.  Tenderness.

EXAM:
DG HIP (WITH OR WITHOUT PELVIS) 2-3V RIGHT

[t pelvis ap]
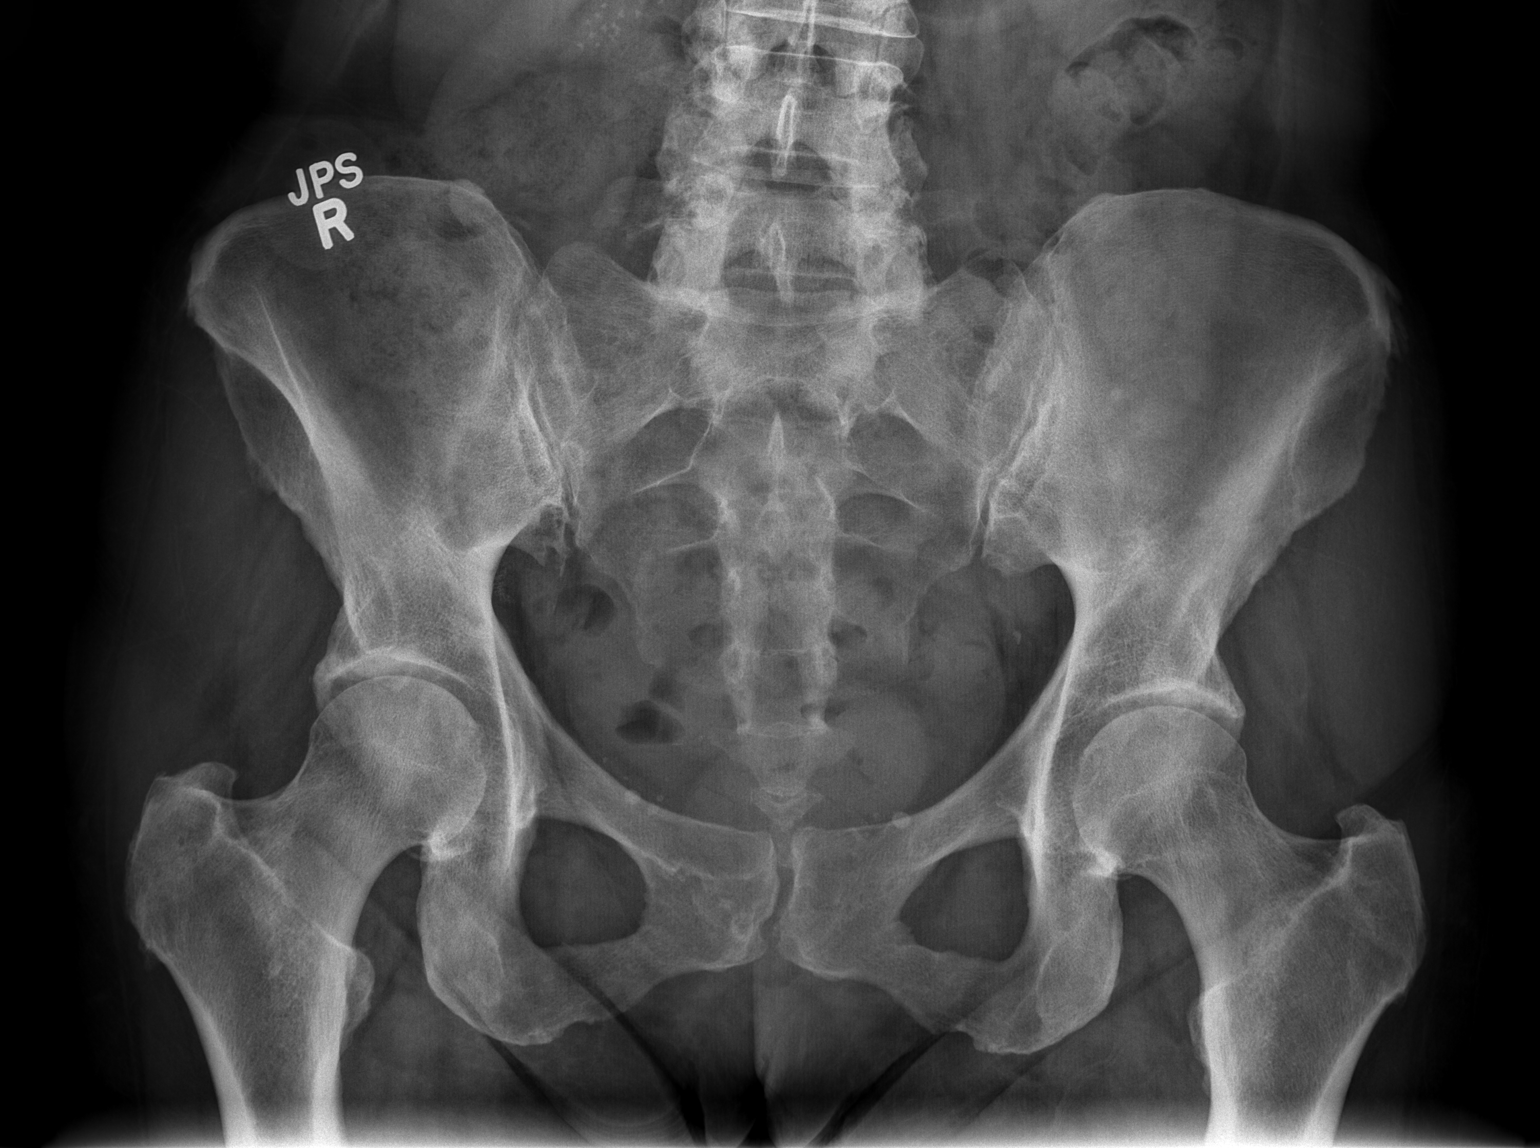

[t hip ap right]
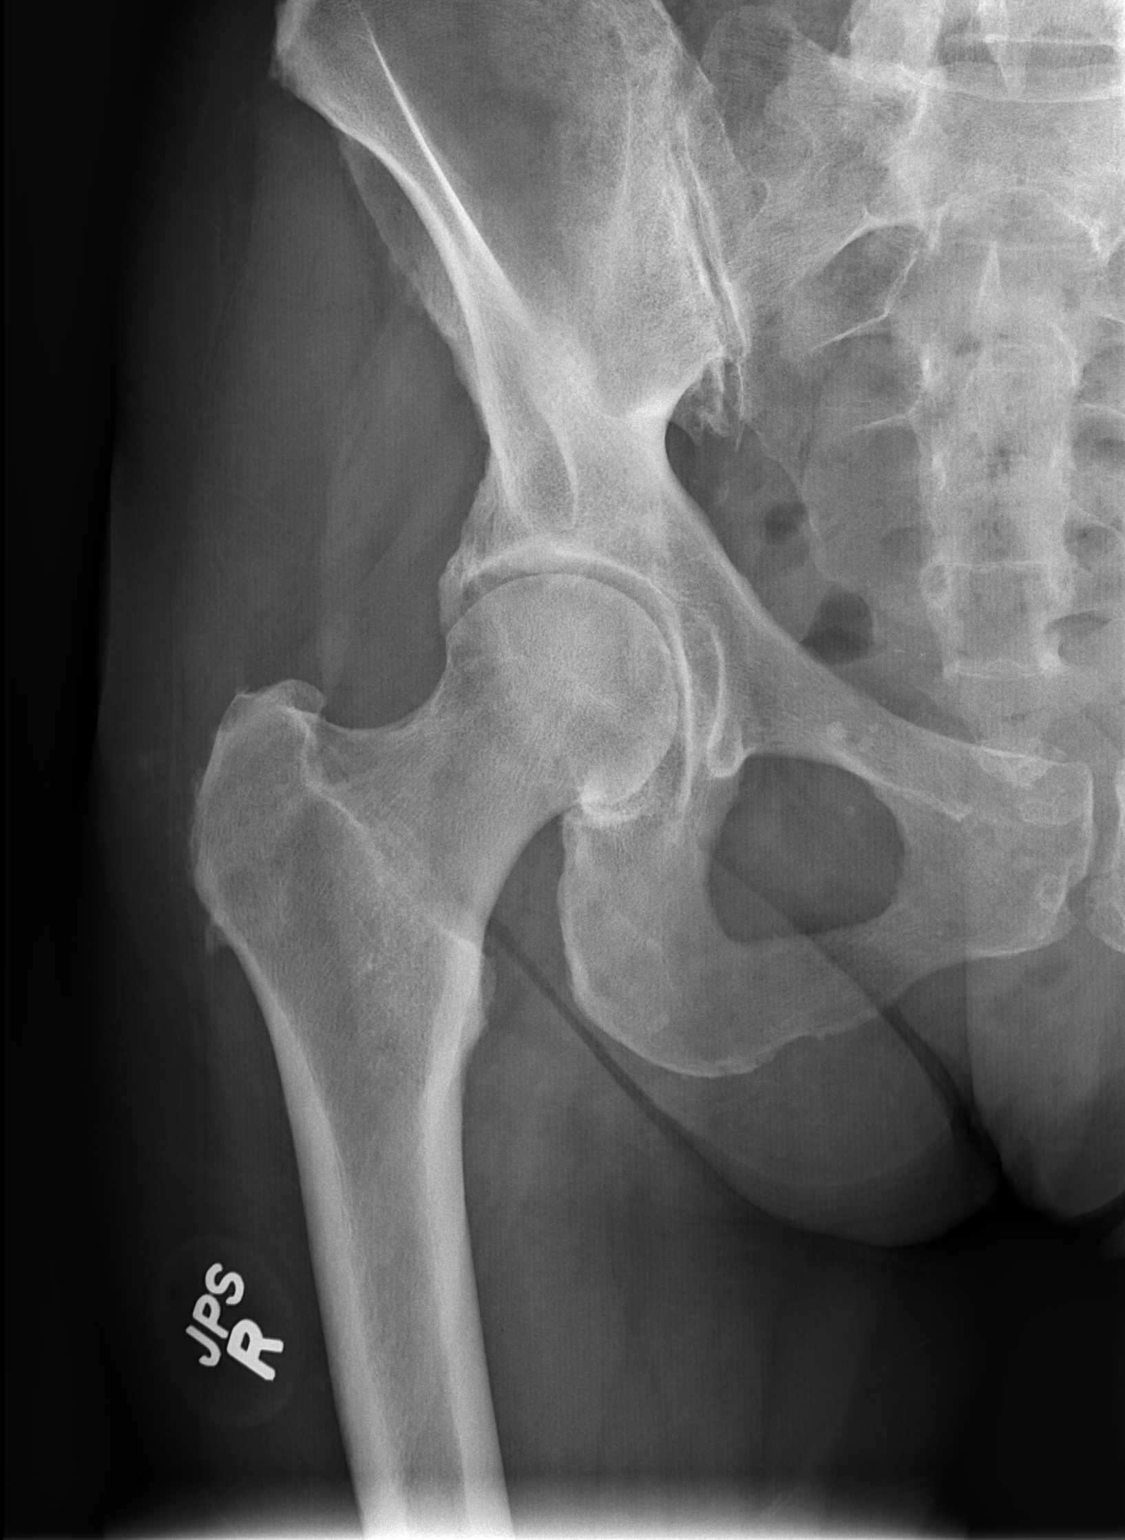

[t hip frog leg right]
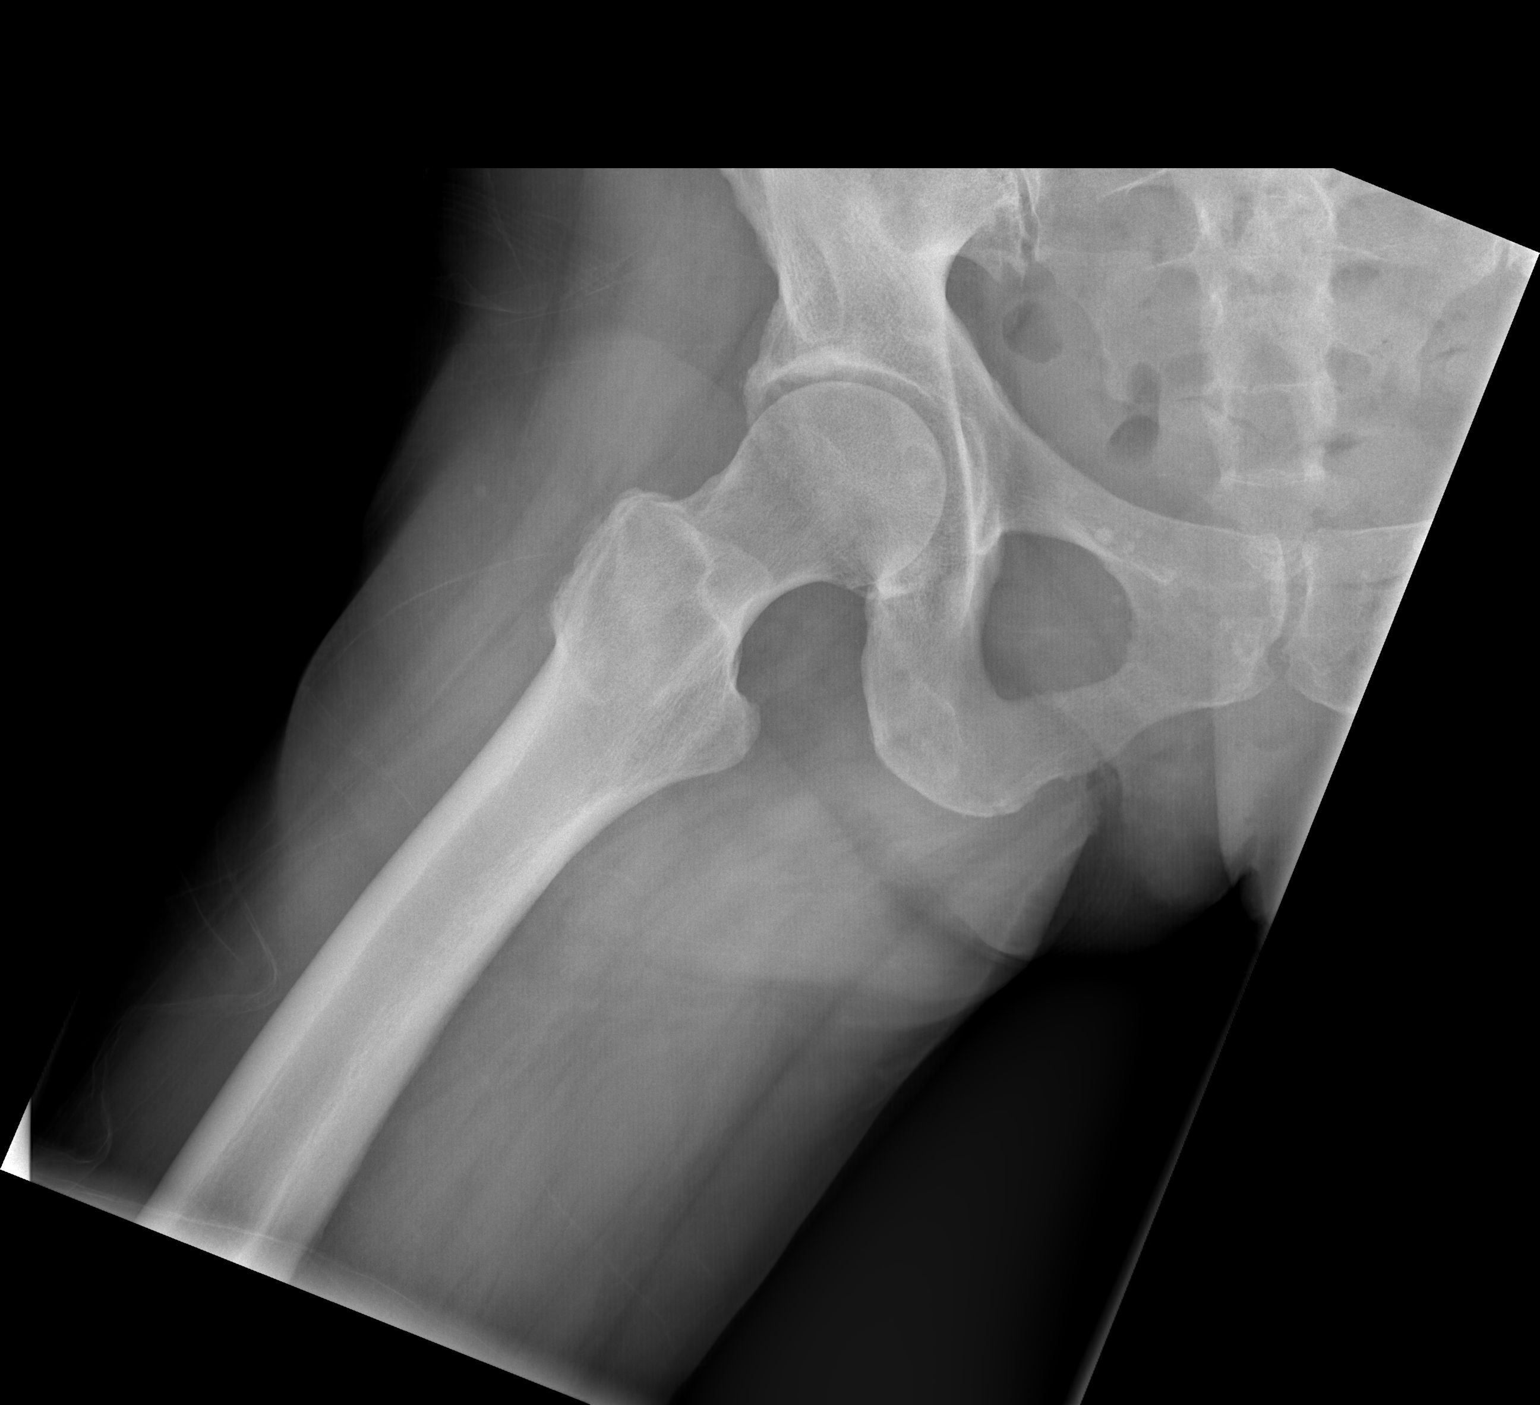

[3 of 3 positions shown; findings below may reference images not displayed]

FINDINGS: Questionable tiny calcifications right upper quadrant. Degenerative
changes lumbar spine and both hips. No acute bony or joint
abnormality identified. No evidence of fracture dislocation.
IMPRESSION: Degenerative changes lumbar spine and both hips. No acute bony
abnormality. No evidence of fracture dislocation .

## 2015-07-22 IMAGING — DX DG ELBOW COMPLETE 3+V*L*
4 series · 4 of 4 positions shown · non-contrast
Comparison: None.

CLINICAL DATA: Fell today with posterior swelling

EXAM:
LEFT ELBOW - COMPLETE 3+ VIEW

[x elbow ap left]
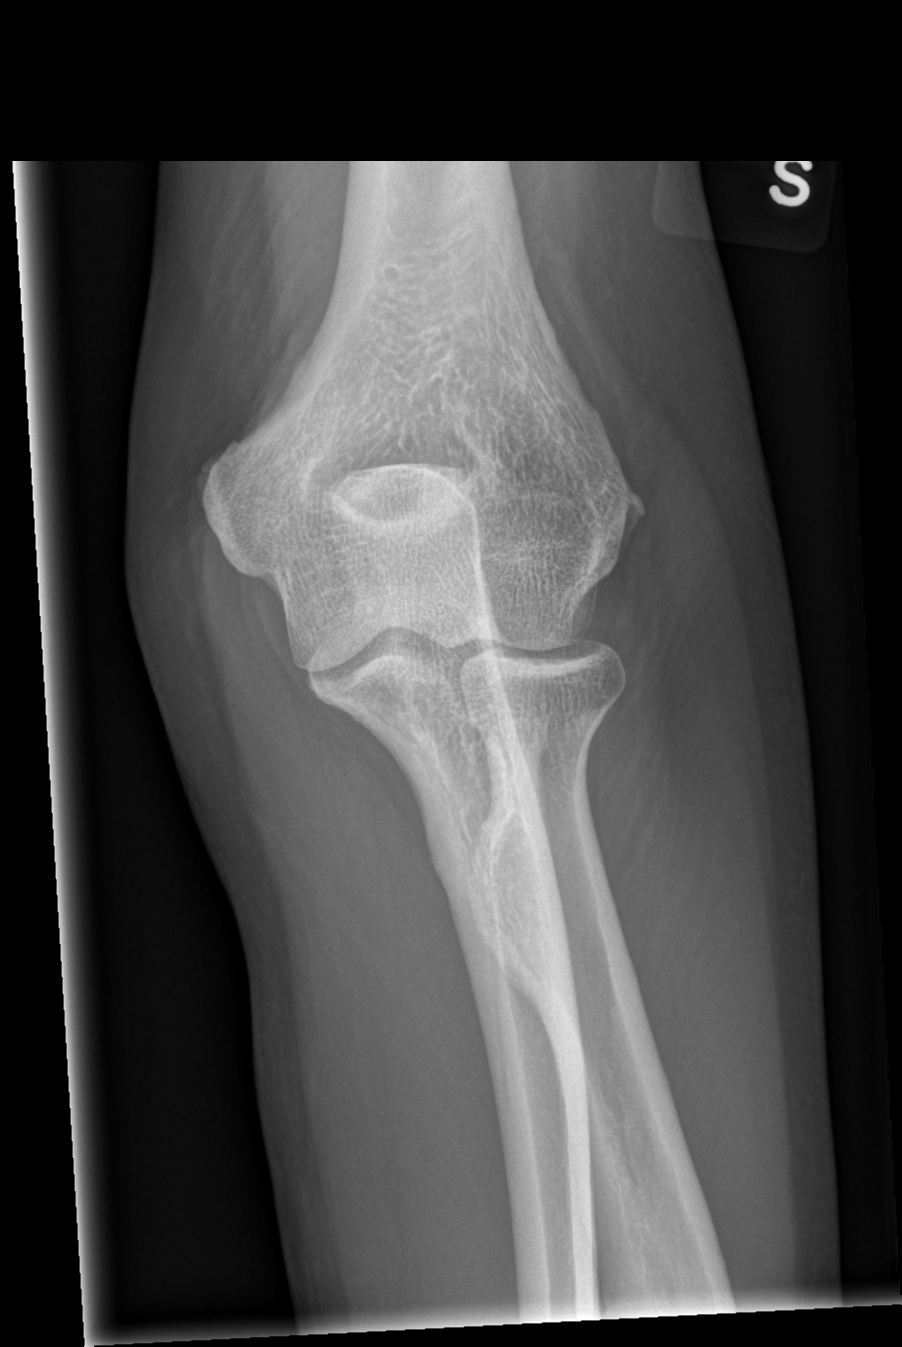

[x elbow obl left (1 of 2)]
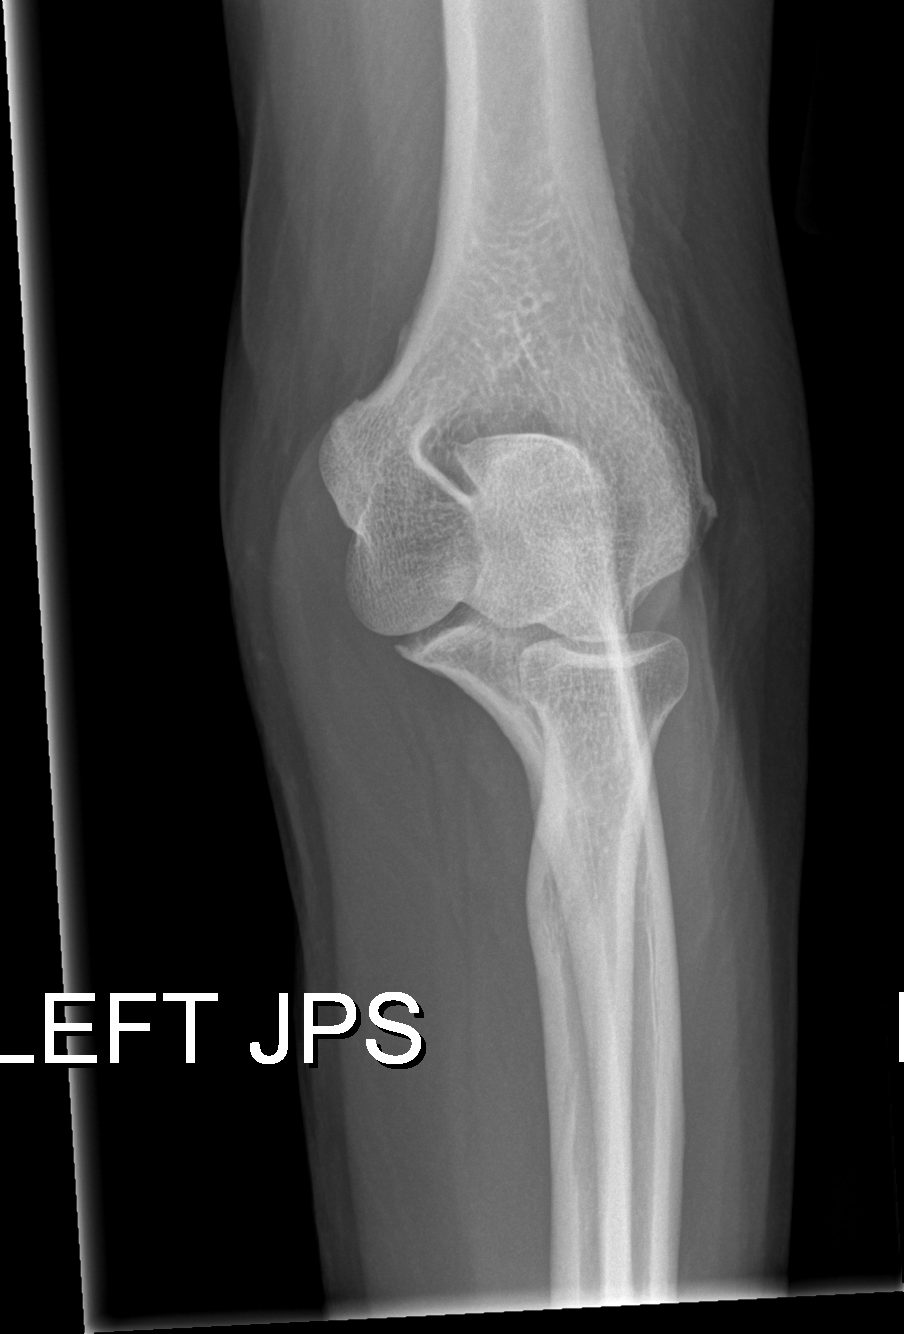

[x elbow obl left (2 of 2)]
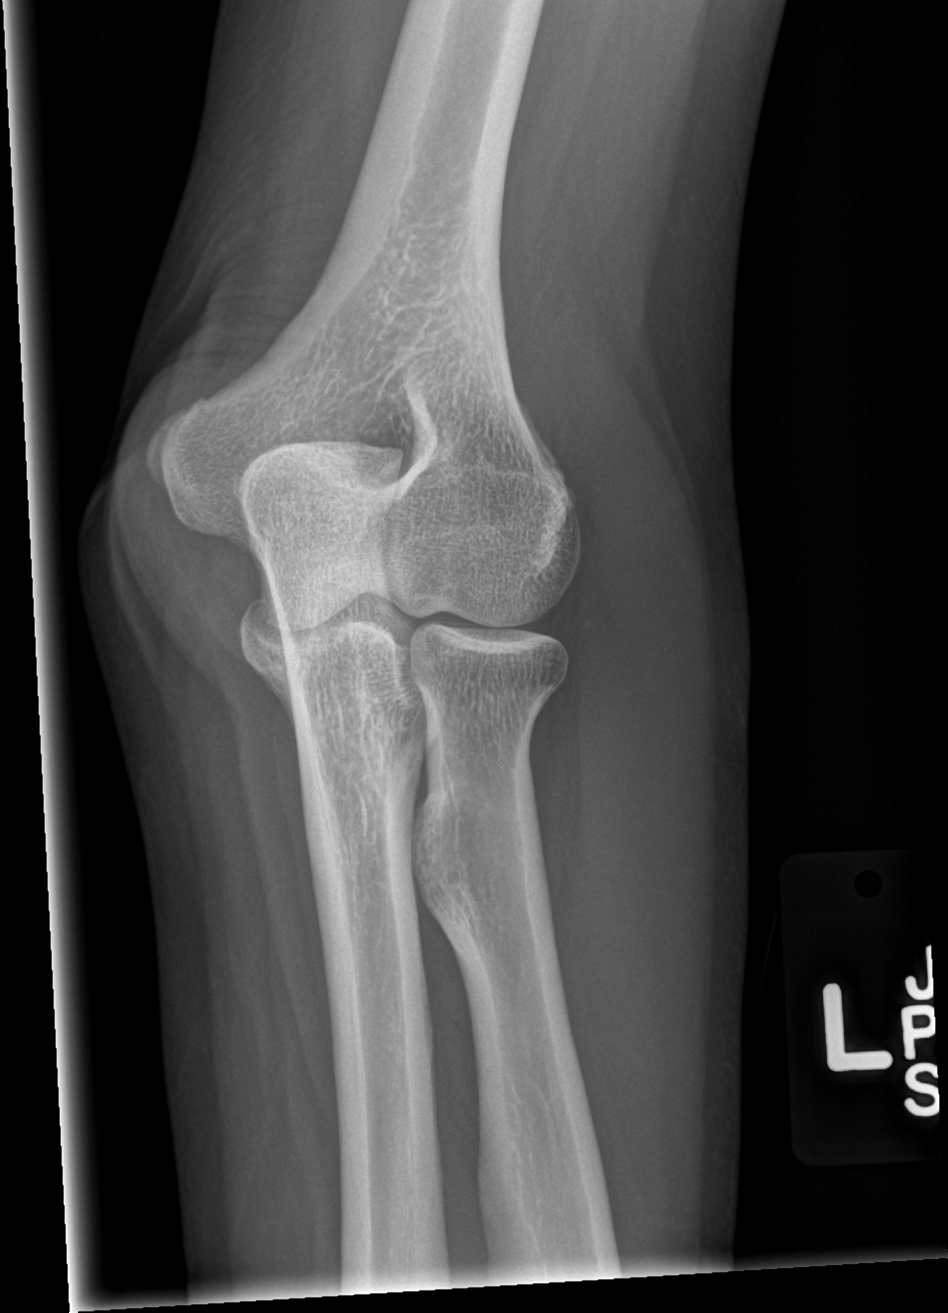

[x elbow lat left]
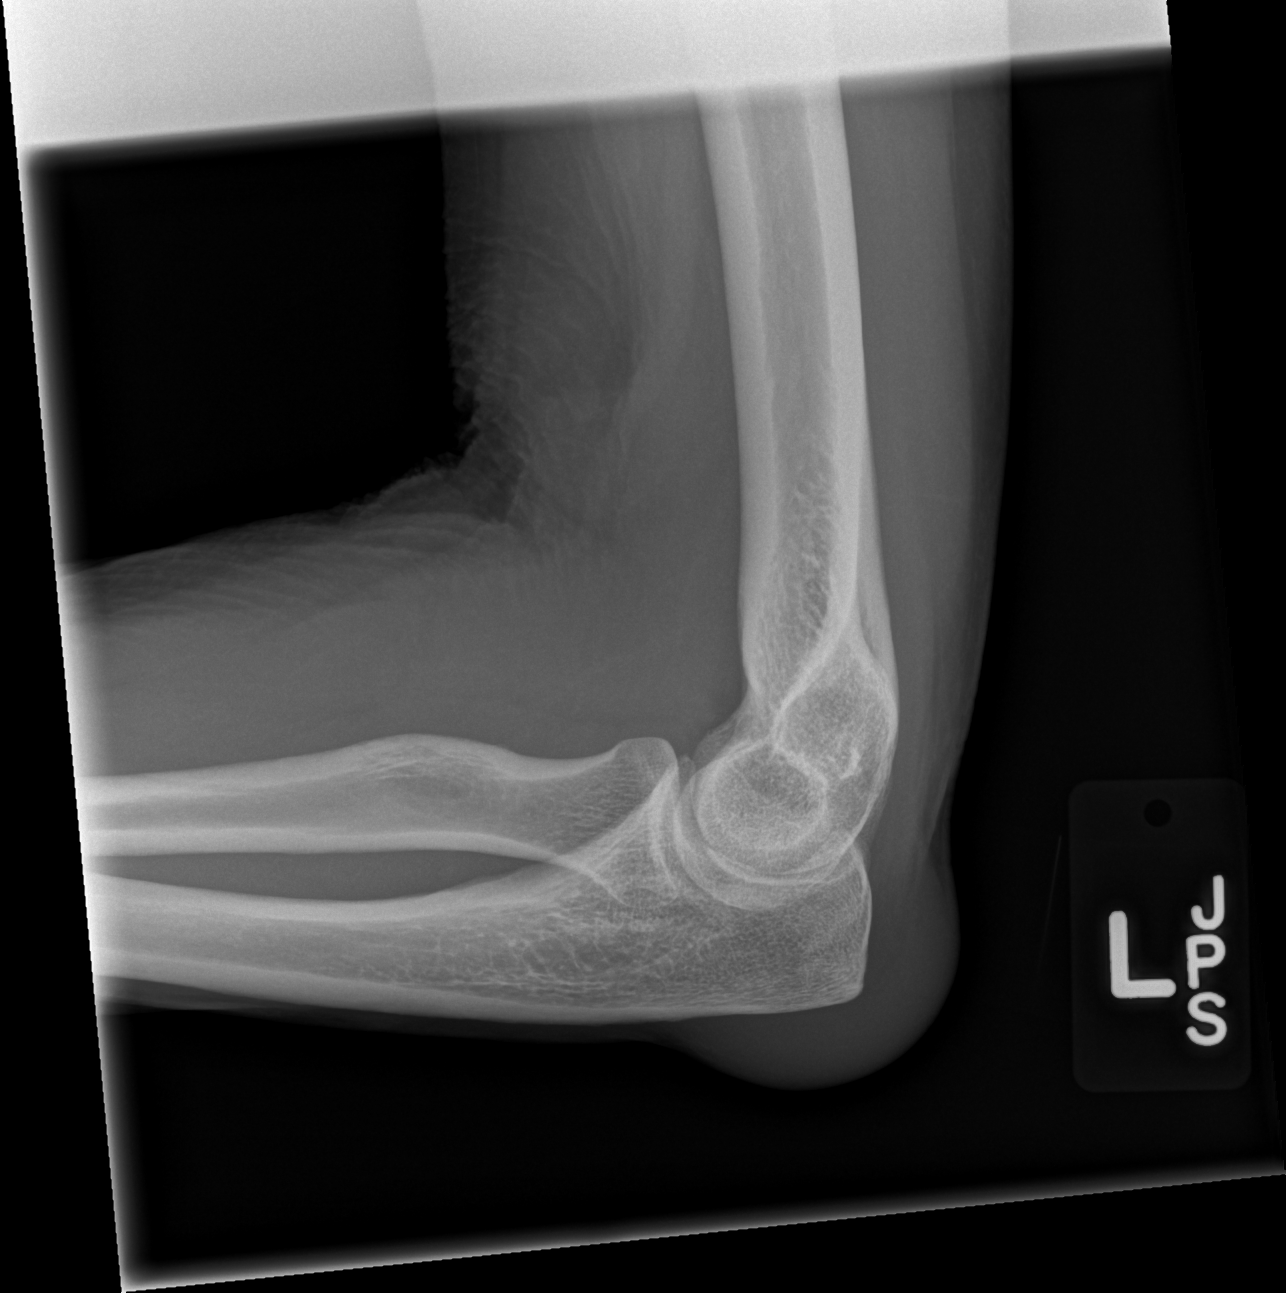

[4 of 4 positions shown; findings below may reference images not displayed]

FINDINGS: Soft tissue swelling over the olecranon process of the ulna but
without evidence of fracture, dislocation or joint effusion.
IMPRESSION: Soft tissue swelling.  No fracture, dislocation or joint effusion.

## 2015-07-22 NOTE — ED Notes (Signed)
Pt states she lost her balance (denies dizziness) while holding her 5 month old grandson.  She hit her L elbow on fireplace and landed  On her R hip.  Denies loc.  Swelling noted to L elbow.

## 2015-07-22 NOTE — Discharge Instructions (Signed)
Please read and follow all provided instructions.  Your diagnoses today include:  1. Fall, initial encounter   2. Traumatic hematoma of elbow, left, initial encounter    Tests performed today include:  Vital signs. See below for your results today.   Medications prescribed:   Take as prescribed   Home care instructions:  Follow any educational materials contained in this packet.  Follow-up instructions: Please follow-up with your primary care provider for further evaluation of symptoms and treatment   Return instructions:   Please return to the Emergency Department if you do not get better, if you get worse, or new symptoms OR  - Fever (temperature greater than 101.32F)  - Bleeding that does not stop with holding pressure to the area    -Severe pain (please note that you may be more sore the day after your accident)  - Chest Pain  - Difficulty breathing  - Severe nausea or vomiting  - Inability to tolerate food and liquids  - Passing out  - Skin becoming red around your wounds  - Change in mental status (confusion or lethargy)  - New numbness or weakness     Please return if you have any other emergent concerns.  Additional Information:  Your vital signs today were: BP 163/76 mmHg   Pulse 79   Temp(Src) 97.8 F (36.6 C) (Oral)   Resp 16   SpO2 96% If your blood pressure (BP) was elevated above 135/85 this visit, please have this repeated by your doctor within one month. ---------------

## 2015-07-22 NOTE — ED Provider Notes (Signed)
CSN: [id number]     Arrival date & time 07/22/15  1034 History   First MD Initiated Contact with Patient 07/22/15 1121     Chief Complaint  Patient presents with  . Fall   (Consider location/radiation/quality/duration/timing/severity/associated sxs/prior Treatment) HPI 75 y.o. female presents to the Emergency Department today s/p mechanical fall while holding her 39 month old son. Pt states that she tripped over a small toy while backing up. States that she hit her left elbow on a fireplace and landed on her right hip as well on the floor. No head trauma or LOC. No presyncopal symptoms. No lightheadedness. No dizziness. No CP/SOB. Does not endorse any pain currently. No elbow pain. No hip pain. No other symptoms noted.    Past Medical History  Diagnosis Date  . Depression   . Restless leg syndrome   . Hypothyroidism   . Anemia   . Hemoptysis   . Vertigo   . COPD (chronic obstructive pulmonary disease) ([place])   . Sinusitis   . Essential hypertension 11/29/2014   Past Surgical History  Procedure Laterality Date  . Vesicovaginal fistula closure w/ tah  1992  . Appendectomy  2009  . Abdominal hysterectomy     Family History  Problem Relation Age of Onset  . Emphysema Mother     smoker  . Heart disease Mother   . COPD Mother   . Emphysema Father     smoker  . Heart disease Father   . Cancer Sister     breast 82 yo   Social History  Substance Use Topics  . Smoking status: Never Smoker   . Smokeless tobacco: Never Used  . Alcohol Use: No   OB History    No data available     Review of Systems ROS reviewed and all are negative for acute change except as noted in the HPI.  Allergies  Augmentin; Black cohosh; Codeine; Hyoscyamine; Oxybutynin chloride; and Minocycline  Home Medications   Prior to Admission medications   Medication Sig Start Date End Date Taking? Authorizing Provider  albuterol (PROVENTIL HFA;VENTOLIN HFA) 108 (90 BASE) MCG/ACT inhaler Inhale 2 puffs  into the lungs every 6 (six) hours as needed for wheezing or shortness of breath. 02/15/15  Yes Deneise Lever, MD  AZOPT 1 % ophthalmic suspension Place 1 drop into the left eye 2 (two) times daily. 07/21/15  Yes Historical Provider, MD  benzonatate (TESSALON) 100 MG capsule Take 100 mg by mouth 3 (three) times daily as needed for cough.   Yes Historical Provider, MD  citalopram (CELEXA) 20 MG tablet Take 20 mg by mouth daily.     Yes Historical Provider, MD  estradiol (ESTRACE) 0.5 MG tablet Take 0.5 mg by mouth daily.     Yes Historical Provider, MD  ferrous gluconate (FERGON) 246 (28 FE) MG tablet Take 246 mg by mouth daily with breakfast.     Yes Historical Provider, MD  fluticasone (FLONASE) 50 MCG/ACT nasal spray Place 2 sprays into both nostrils daily. Patient taking differently: Place 2 sprays into both nostrils daily as needed.  07/29/13  Yes Campbell Riches, MD  Fluticasone-Salmeterol (ADVAIR DISKUS) 500-50 MCG/DOSE AEPB Inhale 1 puff, then rinse mouth well, twice daily Patient taking differently: Inhale 1 puff into the lungs 2 (two) times daily as needed (shortness of breath). Inhale 1 puff, then rinse mouth well, twice daily 02/15/15  Yes Clinton D Young, MD  latanoprost (XALATAN) 0.005 % ophthalmic solution Place 1 drop into both eyes  at bedtime.   Yes Historical Provider, MD  levothyroxine (SYNTHROID, LEVOTHROID) 88 MCG tablet Take 88 mcg by mouth daily.     Yes Historical Provider, MD  meclizine (ANTIVERT) 25 MG tablet Take 25 mg by mouth daily as needed.     Yes Historical Provider, MD  Multiple Minerals (CALCIUM/MAGNESIUM/ZINC) TABS Take 3 tablets by mouth at bedtime. 1800mg    Yes Historical Provider, MD  Multiple Vitamin (MULTIVITAMIN) tablet Take 1 tablet by mouth daily.     Yes Historical Provider, MD  Naproxen Sodium (ALEVE) 220 MG CAPS Take 440 mg by mouth daily. Take as directed prn   Yes Historical Provider, MD  Red Yeast Rice Extract (RED YEAST RICE PO) Take 1 capsule by  mouth daily.   Yes Historical Provider, MD  vitamin C (ASCORBIC ACID) 500 MG tablet Take 500 mg by mouth daily.     Yes Historical Provider, MD   BP 163/76 mmHg  Pulse 79  Temp(Src) 97.8 F (36.6 C) (Oral)  Resp 16  SpO2 96%   Physical Exam  Constitutional: She is oriented to person, place, and time. She appears well-developed and well-nourished.  HENT:  Head: Normocephalic and atraumatic.  Eyes: EOM are normal. Pupils are equal, round, and reactive to light.  Neck: Normal range of motion. Neck supple. No tracheal deviation present.  Cardiovascular: Normal rate, regular rhythm and normal heart sounds.   Pulmonary/Chest: Effort normal and breath sounds normal.  Abdominal: Soft.  Musculoskeletal: Normal range of motion.       Left elbow: She exhibits swelling. She exhibits normal range of motion, no effusion, no deformity and no laceration.       Right hip: Normal. She exhibits normal range of motion, normal strength, no tenderness, no bony tenderness, no swelling, no crepitus, no deformity and no laceration.  Sizable hematoma noted on left elbow. ROM intact. Neurovascularly intact. No pain on palpation.   Neurological: She is alert and oriented to person, place, and time.  Skin: Skin is warm and dry.  Psychiatric: She has a normal mood and affect. Her behavior is normal. Thought content normal.  Nursing note and vitals reviewed.  ED Course  Procedures (including critical care time) Labs Review Labs Reviewed - No data to display  Imaging Review Dg Elbow Complete Left  07/22/2015  CLINICAL DATA:  [place] today with posterior swelling EXAM: LEFT ELBOW - COMPLETE 3+ VIEW COMPARISON:  None. FINDINGS: Soft tissue swelling over the olecranon process of the ulna but without evidence of fracture, dislocation or joint effusion. IMPRESSION: Soft tissue swelling.  No fracture, dislocation or joint effusion. Electronically Signed   By: Nelson Chimes M.D.   On: 07/22/2015 12:14   Dg Hip Unilat  With  Pelvis 2-3 Views Right  07/22/2015  CLINICAL DATA:  Fall.  Tenderness. EXAM: DG HIP (WITH OR WITHOUT PELVIS) 2-3V RIGHT COMPARISON:  No prior . FINDINGS: Questionable tiny calcifications right upper quadrant. Degenerative changes lumbar spine and both hips. No acute bony or joint abnormality identified. No evidence of fracture dislocation. IMPRESSION: Degenerative changes lumbar spine and both hips. No acute bony abnormality. No evidence of fracture dislocation . Electronically Signed   By: [name]  Register   On: 07/22/2015 12:14   I have personally reviewed and evaluated these images and lab results as part of my medical decision-making.   EKG Interpretation None      MDM  I have reviewed and evaluated the relevant imaging studies.  I have reviewed the relevant previous healthcare records.  I obtained HPI from historian. Patient discussed with supervising physician  ED Course:  Assessment: Pt is a 75yF who presents s/p mechanical fall tripping over toy. No head trauma/LOC. No presyncopal episode. On exam, pt in NAD. Nontoxic/nonseptic appearing. VSS. Afebrile. Lungs CTA. Heart RRR. Noted hematoma on left elbow. Full ROM with no pain. Right hip ROM intact with no pain. Both neurovascularly intact. Pt able to ambulate. XR unremarkable. No fracture/dislocation. Plan is to [place]. At time of discharge, Patient is in no acute distress. Vital Signs are stable. Patient is able to ambulate. Patient able to tolerate PO.    Disposition/Plan:  DC Home Additional Verbal discharge instructions given and discussed with patient.  Pt Instructed to f/u with PCP in the next week for evaluation and treatment of symptoms. Return precautions given Pt acknowledges and agrees with plan  Supervising Physician Varney Biles, MD   Final diagnoses:  Fall, initial encounter  Traumatic hematoma of elbow, left, initial encounter     Shary Decamp, PA-C 07/22/15 [place], MD 07/22/15 1623

## 2015-08-08 DIAGNOSIS — J479 Bronchiectasis, uncomplicated: Secondary | ICD-10-CM | POA: Diagnosis not present

## 2015-08-08 DIAGNOSIS — J449 Chronic obstructive pulmonary disease, unspecified: Secondary | ICD-10-CM | POA: Diagnosis not present

## 2015-08-08 DIAGNOSIS — R0902 Hypoxemia: Secondary | ICD-10-CM | POA: Diagnosis not present

## 2015-08-08 DIAGNOSIS — R5383 Other fatigue: Secondary | ICD-10-CM | POA: Diagnosis not present

## 2015-08-16 ENCOUNTER — Ambulatory Visit (INDEPENDENT_AMBULATORY_CARE_PROVIDER_SITE_OTHER): Payer: Commercial Managed Care - HMO | Admitting: Internal Medicine

## 2015-08-16 ENCOUNTER — Encounter: Payer: Self-pay | Admitting: Internal Medicine

## 2015-08-16 DIAGNOSIS — J9611 Chronic respiratory failure with hypoxia: Secondary | ICD-10-CM

## 2015-08-16 DIAGNOSIS — J471 Bronchiectasis with (acute) exacerbation: Secondary | ICD-10-CM

## 2015-08-16 MED ORDER — FLUTICASONE-SALMETEROL 250-50 MCG/DOSE IN AEPB: INHALATION_SPRAY | RESPIRATORY_TRACT | Status: DC

## 2015-08-16 MED ORDER — ALBUTEROL SULFATE HFA 108 (90 BASE) MCG/ACT IN AERS: 2.0000 | INHALATION_SPRAY | Freq: Four times a day (QID) | RESPIRATORY_TRACT | Status: DC | PRN

## 2015-08-16 NOTE — Progress Notes (Signed)
Patient ID: [name], female    DOB: 1940/10/26, 75 y.o.   MRN: [id number]  HPI  F never smoker with hx bronchitis/bronchiectasis and chronic recurrent hemoptysis, + MAIC..   06/29/14-72 yo F never smoker with hx bronchitis/ bronchiectasis and chronic recurrent hemoptysis, + MAIC 09/12/12.complicated by Nocardia,  glaucoma, hx sinusitis Rx: 02/10/13- zith 500 mg TIW, EMB 1200 TIW, Rif 300 mg TIW. Ended by ID. Nocardia Rx'd Bactrim x 1 year FOLLOWS FOR: Pt states she has been SOB; had to use rescue inhaler 2 times recently. She has felt more "tired" since spending time caring for Lacie Landry and active granddaughter. Not really short of breath. PFT in 2012 had shown small airway obstructive disease with an early emphysema pattern. She grew up with secondhand smoke but never smoker self. Father had emphysema. Morning cough with transient dark sputum then clear for the rest of the day. She denies night sweats, adenopathy, hemoptysis. CXR 02/23/14 IMPRESSION: COPD with chronically increased pulmonary interstitial markings likely reflecting the sequelae of previous infection. There is no alveolar pneumonia nor evidence of CHF. Electronically Signed  By: David Martinique  On: 02/23/2014 10:27 Office spirometry 06/29/2014-moderate obstructive airways disease, FVC 2.17/69%, FEV1 1.39/60%, FEV1/FVC 64%, FEF 25-75 percent 0.75/41%.  02/15/2015-75 year old female never smoker with chronic bronchitis/bronchiectasis, chronic recurrent hemoptysis,  positive MAIC 0000000 complicated by Nocardia, glaucoma, history sinusitis Rx: 02/10/13- zith 500 mg TIW, EMB 1200 TIW, Rif 300 mg TIW. Ended by ID. Nocardia Rx'd Bactrim x 1 year FOLLOWS FOR: pt has had increased SOB, nonprod cough X1 month since taking care of deceased sister's 2 cats.   2015/08/30-75 year old female never smoker followed for chronic bronchitis/bronchiectasis, chronic recurrent hemoptysis  positive MAIC 0000000 complicated by Nocardia,  glaucoma, history sinusitis Rx: 02/10/13- zith 500 mg TIW, EMB 1200 TIW, Rif 300 mg TIW. Ended by ID. Nocardia Rx'd Bactrim x 1 year O2 2 L sleep/[employer] FOLLOW FOR: started oxygen 1 month ago, has not been using it much, just moved this weekend. coughing, chest congestion. coughing so hard she gets nauseous.   Moving to a new apartment for her out with increased cough but no fever or purulent sputum. Her PCP ordered home oxygen / [employer] but she has been "too busy" to use it and finds it "too heavy to carry". Continues Advair 250. CXR 02/15/2015 IMPRESSION: 1. Stable chronic pleural parenchymal scarring and bronchiectasis without evidence of acute cardiopulmonary process. Electronically Signed  By: Jacqulynn Cadet M.D.  On: 02/15/2015 12:05  Review of Systems-see HPI Constitutional:   No-   weight loss, night sweats, fevers, chills, fatigue, lassitude. HEENT:   No-  headaches, difficulty swallowing, tooth/dental problems, sore throat,       No- sneezing, no-itching, ear ache, nasal congestion, post nasal drip,  CV:  No-   chest pain, orthopnea, PND, swelling in lower extremities, anasarca, dizziness, palpitations Resp: +  shortness of breath with exertion or at rest.            +  productive cough,  + non-productive cough,  No- recent coughing up of blood.               change in color of mucus.  No- wheezing.   Skin: No-   rash or lesions. GI:  No-   heartburn, indigestion, abdominal pain, nausea, vomiting,  GU:  MS:  No-   joint pain or swelling.  . Neuro-     nothing unusual Psych:  No- change in mood or affect. No depression or anxiety.  No memory loss.  Objective:   Physical Exam General- Alert, Oriented, Affect-cheerful, Distress- none acute. Trim. Looks well. Skin- rash-none, lesions- none, excoriation- none Lymphadenopathy- none Head- atraumatic            Eyes- Gross vision intact, PERRLA, conjunctivae clear secretions            Ears- Hearing, canals-normal             Nose- Clear, no-Septal dev, mucus, polyps, erosion, perforation             Throat- Mallampati II , mucosa clear , drainage- none, tonsils-  atrophic Neck- flexible , trachea midline, no stridor , thyroid nl, carotid no bruit Chest - symmetrical excursion , unlabored           Heart/CV- RRR/ occ extra beat , no murmur , no gallop  , no rub, nl s1 s2                           - JVD- none , edema- none, stasis changes- none, varices- none           Lung- clear, cough+dry, dullness-none, rub- none           Chest wall-  Abd-  Br/ Gen/ Rectal- Not done, not indicated Extrem- cyanosis- none, clubbing, none, atrophy- none, strength- nl.  Neuro- grossly intact to observation

## 2015-08-16 NOTE — Patient Instructions (Addendum)
Ok to use oxygen 2L/ [employer] for sleep and as needed during the day  Script printed changing advair to 250 strength- Inhale 1 puff then rinse mouth, twice daily- maintenance inhaler  Script printed for an albuterol "rescue" inhaler    Inhale 2 puffs every 6 hours, as needed.  This is the one to carry with you in case you need during the day.  Neb xop 0.63    Dx exacerbation chronic bronchitis  Please call as needed

## 2015-09-07 DIAGNOSIS — R0902 Hypoxemia: Secondary | ICD-10-CM | POA: Diagnosis not present

## 2015-09-07 DIAGNOSIS — J449 Chronic obstructive pulmonary disease, unspecified: Secondary | ICD-10-CM | POA: Diagnosis not present

## 2015-09-07 DIAGNOSIS — R5383 Other fatigue: Secondary | ICD-10-CM | POA: Diagnosis not present

## 2015-09-07 DIAGNOSIS — J479 Bronchiectasis, uncomplicated: Secondary | ICD-10-CM | POA: Diagnosis not present

## 2015-09-15 ENCOUNTER — Other Ambulatory Visit: Payer: Self-pay | Admitting: Family Medicine

## 2015-09-15 DIAGNOSIS — Z1231 Encounter for screening mammogram for malignant neoplasm of breast: Secondary | ICD-10-CM

## 2015-09-26 ENCOUNTER — Ambulatory Visit
Admission: RE | Admit: 2015-09-26 | Discharge: 2015-09-26 | Disposition: A | Discharge status: Home / Self Care | Payer: Commercial Managed Care - HMO | Source: Ambulatory Visit | Attending: Family Medicine | Admitting: Family Medicine

## 2015-09-26 DIAGNOSIS — Z1231 Encounter for screening mammogram for malignant neoplasm of breast: Secondary | ICD-10-CM | POA: Diagnosis not present

## 2015-10-02 DIAGNOSIS — Z885 Allergy status to narcotic agent status: Secondary | ICD-10-CM | POA: Diagnosis not present

## 2015-10-02 DIAGNOSIS — K8020 Calculus of gallbladder without cholecystitis without obstruction: Secondary | ICD-10-CM | POA: Diagnosis not present

## 2015-10-02 DIAGNOSIS — J449 Chronic obstructive pulmonary disease, unspecified: Secondary | ICD-10-CM | POA: Diagnosis not present

## 2015-10-02 DIAGNOSIS — J189 Pneumonia, unspecified organism: Secondary | ICD-10-CM | POA: Diagnosis not present

## 2015-10-02 DIAGNOSIS — E039 Hypothyroidism, unspecified: Secondary | ICD-10-CM | POA: Diagnosis not present

## 2015-10-02 DIAGNOSIS — Z79899 Other long term (current) drug therapy: Secondary | ICD-10-CM | POA: Diagnosis not present

## 2015-10-02 DIAGNOSIS — J440 Chronic obstructive pulmonary disease with acute lower respiratory infection: Secondary | ICD-10-CM | POA: Diagnosis not present

## 2015-10-08 DIAGNOSIS — J449 Chronic obstructive pulmonary disease, unspecified: Secondary | ICD-10-CM | POA: Diagnosis not present

## 2015-10-08 DIAGNOSIS — R0902 Hypoxemia: Secondary | ICD-10-CM | POA: Diagnosis not present

## 2015-10-08 DIAGNOSIS — R5383 Other fatigue: Secondary | ICD-10-CM | POA: Diagnosis not present

## 2015-10-08 DIAGNOSIS — J479 Bronchiectasis, uncomplicated: Secondary | ICD-10-CM | POA: Diagnosis not present

## 2015-10-11 DIAGNOSIS — J189 Pneumonia, unspecified organism: Secondary | ICD-10-CM | POA: Diagnosis not present

## 2015-10-12 DIAGNOSIS — H401111 Primary open-angle glaucoma, right eye, mild stage: Secondary | ICD-10-CM | POA: Diagnosis not present

## 2015-10-12 DIAGNOSIS — H2513 Age-related nuclear cataract, bilateral: Secondary | ICD-10-CM | POA: Diagnosis not present

## 2015-10-12 DIAGNOSIS — H401122 Primary open-angle glaucoma, left eye, moderate stage: Secondary | ICD-10-CM | POA: Diagnosis not present

## 2015-10-28 DIAGNOSIS — R05 Cough: Secondary | ICD-10-CM | POA: Diagnosis not present

## 2015-10-28 DIAGNOSIS — B349 Viral infection, unspecified: Secondary | ICD-10-CM | POA: Diagnosis not present

## 2015-11-04 DIAGNOSIS — J449 Chronic obstructive pulmonary disease, unspecified: Secondary | ICD-10-CM | POA: Diagnosis not present

## 2015-11-08 DIAGNOSIS — J479 Bronchiectasis, uncomplicated: Secondary | ICD-10-CM | POA: Diagnosis not present

## 2015-11-08 DIAGNOSIS — R5383 Other fatigue: Secondary | ICD-10-CM | POA: Diagnosis not present

## 2015-11-08 DIAGNOSIS — J449 Chronic obstructive pulmonary disease, unspecified: Secondary | ICD-10-CM | POA: Diagnosis not present

## 2015-11-08 DIAGNOSIS — R0902 Hypoxemia: Secondary | ICD-10-CM | POA: Diagnosis not present

## 2015-11-13 DIAGNOSIS — J9611 Chronic respiratory failure with hypoxia: Secondary | ICD-10-CM | POA: Insufficient documentation

## 2015-11-13 NOTE — Assessment & Plan Note (Signed)
Resting room air saturation today 94%. I don't have a qualifying data on which her sleep oxygen is based. We discussed O2 usage

## 2015-11-13 NOTE — Assessment & Plan Note (Signed)
There may be some exacerbation of her chronic bronchitis/bronchiectasis although she has been focused on her recent apartment move. Plan-use Advair 250/50. Add a rescue inhaler. Nebulizer treatment today Xopenex/Depo-Medrol

## 2015-12-08 DIAGNOSIS — R0902 Hypoxemia: Secondary | ICD-10-CM | POA: Diagnosis not present

## 2015-12-08 DIAGNOSIS — J479 Bronchiectasis, uncomplicated: Secondary | ICD-10-CM | POA: Diagnosis not present

## 2015-12-08 DIAGNOSIS — R5383 Other fatigue: Secondary | ICD-10-CM | POA: Diagnosis not present

## 2015-12-08 DIAGNOSIS — J449 Chronic obstructive pulmonary disease, unspecified: Secondary | ICD-10-CM | POA: Diagnosis not present

## 2015-12-15 DIAGNOSIS — J069 Acute upper respiratory infection, unspecified: Secondary | ICD-10-CM | POA: Diagnosis not present

## 2015-12-15 DIAGNOSIS — R52 Pain, unspecified: Secondary | ICD-10-CM | POA: Diagnosis not present

## 2015-12-22 ENCOUNTER — Emergency Department (HOSPITAL_COMMUNITY): Payer: Commercial Managed Care - HMO

## 2015-12-22 ENCOUNTER — Emergency Department (HOSPITAL_COMMUNITY)
Admission: EM | Admit: 2015-12-22 | Discharge: 2015-12-22 | Disposition: A | Discharge status: Home / Self Care | Payer: Commercial Managed Care - HMO | Attending: Emergency Medicine | Admitting: Emergency Medicine

## 2015-12-22 ENCOUNTER — Encounter (HOSPITAL_COMMUNITY): Payer: Self-pay | Admitting: Nurse Practitioner

## 2015-12-22 DIAGNOSIS — J441 Chronic obstructive pulmonary disease with (acute) exacerbation: Secondary | ICD-10-CM | POA: Diagnosis not present

## 2015-12-22 DIAGNOSIS — E039 Hypothyroidism, unspecified: Secondary | ICD-10-CM | POA: Insufficient documentation

## 2015-12-22 DIAGNOSIS — R404 Transient alteration of awareness: Secondary | ICD-10-CM | POA: Diagnosis not present

## 2015-12-22 DIAGNOSIS — I10 Essential (primary) hypertension: Secondary | ICD-10-CM | POA: Insufficient documentation

## 2015-12-22 DIAGNOSIS — R531 Weakness: Secondary | ICD-10-CM | POA: Diagnosis not present

## 2015-12-22 DIAGNOSIS — R0602 Shortness of breath: Secondary | ICD-10-CM | POA: Diagnosis not present

## 2015-12-22 LAB — CBC WITH DIFFERENTIAL/PLATELET
BASOS ABS: 0 10*3/uL (ref 0.0–0.1)
BASOS PCT: 1 %
EOS ABS: 0.2 10*3/uL (ref 0.0–0.7)
EOS PCT: 4 %
HCT: 42.3 % (ref 36.0–46.0)
Hemoglobin: 13.8 g/dL (ref 12.0–15.0)
Lymphocytes Relative: 35 %
Lymphs Abs: 1.7 10*3/uL (ref 0.7–4.0)
MCH: 30.8 pg (ref 26.0–34.0)
MCHC: 32.6 g/dL (ref 30.0–36.0)
MCV: 94.4 fL (ref 78.0–100.0)
Monocytes Absolute: 0.5 10*3/uL (ref 0.1–1.0)
Monocytes Relative: 11 %
Neutro Abs: 2.4 10*3/uL (ref 1.7–7.7)
Neutrophils Relative %: 49 %
PLATELETS: 174 10*3/uL (ref 150–400)
RBC: 4.48 MIL/uL (ref 3.87–5.11)
RDW: 13.9 % (ref 11.5–15.5)
WBC: 4.8 10*3/uL (ref 4.0–10.5)

## 2015-12-22 LAB — I-STAT CHEM 8, ED
BUN: 13 mg/dL (ref 6–20)
CHLORIDE: 102 mmol/L (ref 101–111)
Calcium, Ion: 1.16 mmol/L (ref 1.15–1.40)
Creatinine, Ser: 0.8 mg/dL (ref 0.44–1.00)
Glucose, Bld: 85 mg/dL (ref 65–99)
HEMATOCRIT: 44 % (ref 36.0–46.0)
Hemoglobin: 15 g/dL (ref 12.0–15.0)
POTASSIUM: 4.2 mmol/L (ref 3.5–5.1)
Sodium: 140 mmol/L (ref 135–145)
TCO2: 29 mmol/L (ref 0–100)

## 2015-12-22 IMAGING — CR DG CHEST 2V
2 series · 2 of 2 positions shown · non-contrast
Comparison: [DATE]

CLINICAL DATA: Shortness of Breath

EXAM:
CHEST  2 VIEW

[w chest lat]
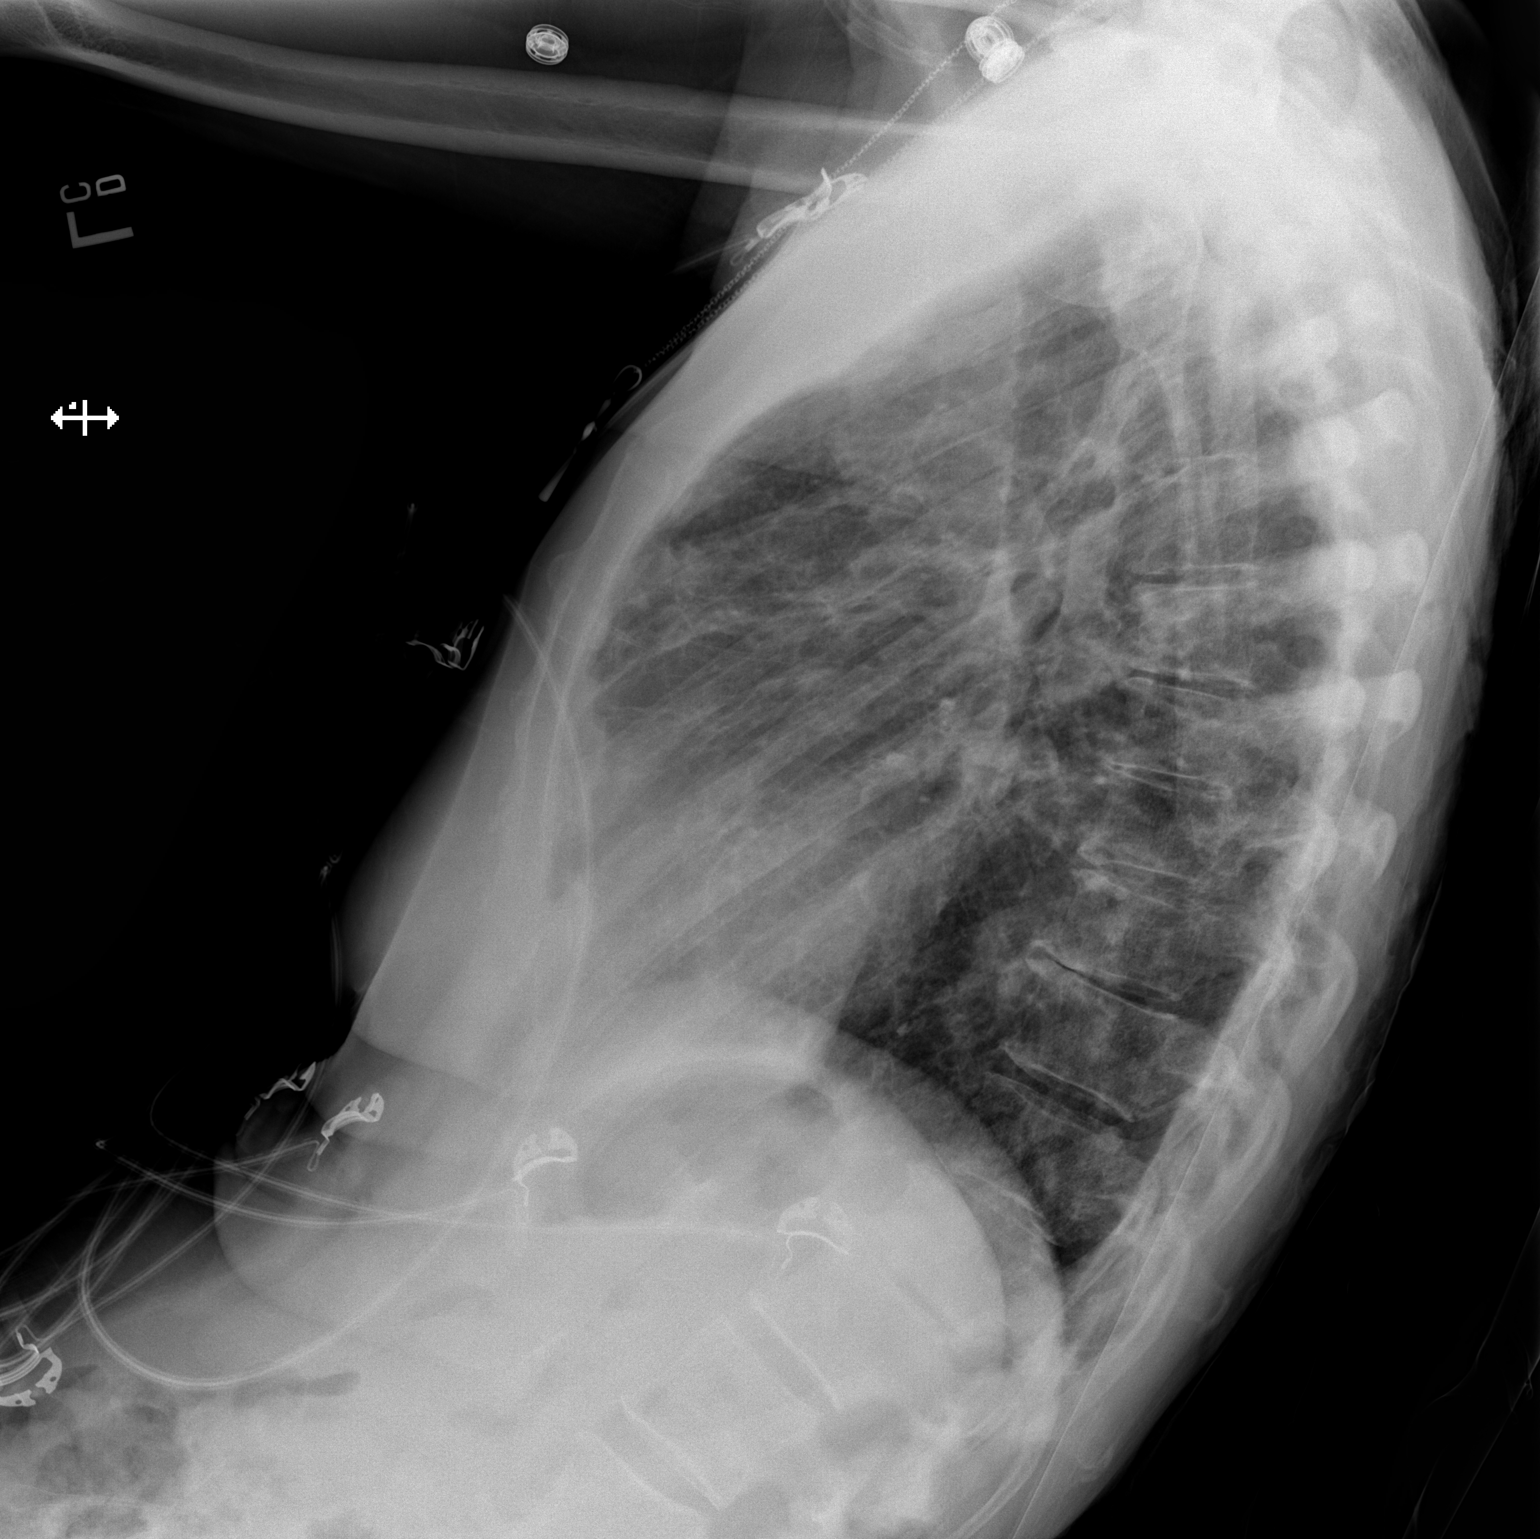

[x chest ap]
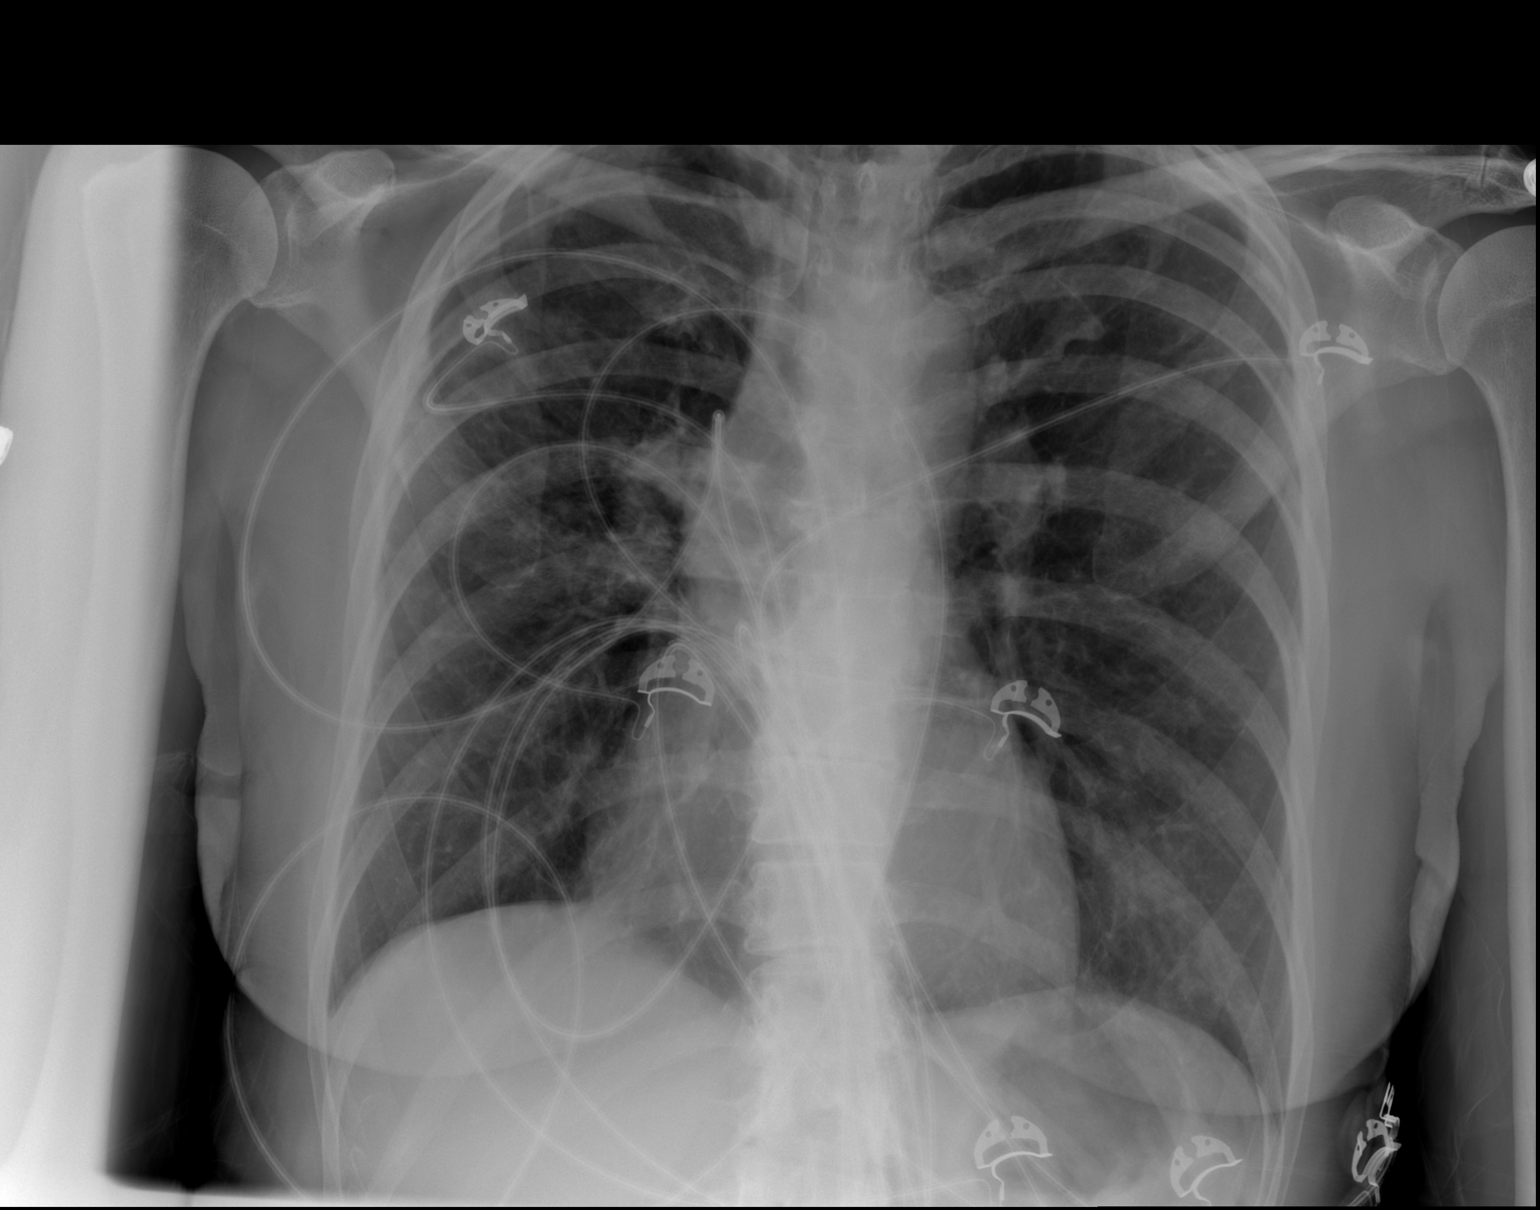

[2 of 2 positions shown; findings below may reference images not displayed]

FINDINGS: The cardiac shadow is within normal limits. The lungs are well
aerated bilaterally. Mild bronchitic changes are again seen and
stable. No acute infiltrate or sizable effusion is noted.
Degenerative changes of the thoracic spine are seen.
IMPRESSION: Chronic changes without acute abnormality.

## 2015-12-22 MED ORDER — METHYLPREDNISOLONE SODIUM SUCC 125 MG IJ SOLR
125.0000 mg | Freq: Once | INTRAMUSCULAR | Status: AC
  Administered 2015-12-22: 125 mg via INTRAVENOUS
  Filled 2015-12-22: qty 2

## 2015-12-22 MED ORDER — LEVOFLOXACIN 500 MG PO TABS: 500.0000 mg | ORAL_TABLET | Freq: Every day | ORAL | 0 refills | Status: DC

## 2015-12-22 MED ORDER — IPRATROPIUM-ALBUTEROL 0.5-2.5 (3) MG/3ML IN SOLN
3.0000 mL | Freq: Once | RESPIRATORY_TRACT | Status: AC
  Administered 2015-12-22: 3 mL via RESPIRATORY_TRACT
  Filled 2015-12-22: qty 3

## 2015-12-22 MED ORDER — PREDNISONE 20 MG PO TABS: 20.0000 mg | ORAL_TABLET | Freq: Two times a day (BID) | ORAL | 0 refills | Status: DC

## 2015-12-22 MED ORDER — LEVOFLOXACIN 750 MG PO TABS
750.0000 mg | ORAL_TABLET | Freq: Once | ORAL | Status: AC
  Administered 2015-12-22: 750 mg via ORAL
  Filled 2015-12-22: qty 1

## 2015-12-22 NOTE — ED Provider Notes (Signed)
[facility] DEPT Provider Note   CSN: [id number] Arrival date & time: 12/22/15  1346     History   Chief Complaint Chief Complaint  Patient presents with  . Shortness of Breath    HPI [name] is a 75 y.o. female.  HPI Patient has a history of COPD/Bronchietasis with shortness of breath over the lastYear but worse over the last month. States that around 6 weeks ago she diagnosed with pneumonia and given antibiotics. States it was IV at the hospital but none home with her. Also around a week ago he gotten worse. States she has chronic oxygen that she's been using. Her sputum has gone from white to clear. No swelling or legs. No chest pain. States she does feel fatigued.  Past Medical History:  Diagnosis Date  . Anemia   . COPD (chronic obstructive pulmonary disease) ([place])   . Depression   . Essential hypertension 11/29/2014  . Hemoptysis   . Hypothyroidism   . Restless leg syndrome   . Sinusitis   . Vertigo     Patient Active Problem List   Diagnosis Date Noted  . Chronic respiratory failure with hypoxia ([place]) 11/13/2015  . Essential hypertension 11/29/2014  . Pneumonia due to Nocardia ([place]) 03/10/2014  . Allergic sinusitis 07/29/2013  . Unspecified sinusitis (chronic) 04/13/2013  . Cough with hemoptysis 08/01/2010  . Bronchiectasis without acute exacerbation + [facility] 08/01/2010  . HYPOTHYROIDISM NOS 12/17/2006  . PULMONARY NODULE 12/17/2006  . DIVERTICULITIS, ACUTE 12/17/2006  . Acute exacerbation of chronic obstructive pulmonary disease (COPD)/ bronchiectasis 12/17/2006    Past Surgical History:  Procedure Laterality Date  . ABDOMINAL HYSTERECTOMY    . APPENDECTOMY  2009  . VESICOVAGINAL FISTULA CLOSURE W/ TAH  1992    OB History    No data available       Home Medications    Prior to Admission medications   Medication Sig Start Date End Date Taking? Authorizing Provider  albuterol (PROVENTIL HFA;VENTOLIN HFA) 108 (90 Base) MCG/ACT  inhaler Inhale 2 puffs into the lungs every 6 (six) hours as needed for wheezing or shortness of breath. 08/16/15   Deneise Lever, MD  AZOPT 1 % ophthalmic suspension Place 1 drop into the left eye 2 (two) times daily. 07/21/15   Historical Provider, MD  benzonatate (TESSALON) 100 MG capsule Take 100 mg by mouth 3 (three) times daily as needed for cough.    Historical Provider, MD  citalopram (CELEXA) 20 MG tablet Take 20 mg by mouth daily.      Historical Provider, MD  estradiol (ESTRACE) 0.5 MG tablet Take 0.5 mg by mouth daily.      Historical Provider, MD  ferrous gluconate (FERGON) 246 (28 FE) MG tablet Take 246 mg by mouth daily with breakfast.      Historical Provider, MD  fluticasone (FLONASE) 50 MCG/ACT nasal spray Place 2 sprays into both nostrils daily. Patient taking differently: Place 2 sprays into both nostrils daily as needed.  07/29/13   Campbell Riches, MD  Fluticasone-Salmeterol (ADVAIR) 250-50 MCG/DOSE AEPB Inhale 1 puff then rinse mouth, twice daily- maintenance 08/16/15   Deneise Lever, MD  latanoprost (XALATAN) 0.005 % ophthalmic solution Place 1 drop into both eyes at bedtime.    Historical Provider, MD  levothyroxine (SYNTHROID, LEVOTHROID) 88 MCG tablet Take 88 mcg by mouth daily.      Historical Provider, MD  meclizine (ANTIVERT) 25 MG tablet Take 25 mg by mouth daily as needed.  Historical Provider, MD  Multiple Minerals (CALCIUM/MAGNESIUM/ZINC) TABS Take 3 tablets by mouth at bedtime. 1800mg     Historical Provider, MD  Multiple Vitamin (MULTIVITAMIN) tablet Take 1 tablet by mouth daily.      Historical Provider, MD  Naproxen Sodium (ALEVE) 220 MG CAPS Take 440 mg by mouth daily. Take as directed prn    Historical Provider, MD  Red Yeast Rice Extract (RED YEAST RICE PO) Take 1 capsule by mouth daily.    Historical Provider, MD  vitamin C (ASCORBIC ACID) 500 MG tablet Take 500 mg by mouth daily.      Historical Provider, MD    Family History Family History  Problem  Relation Age of Onset  . Emphysema Mother     smoker  . Heart disease Mother   . COPD Mother   . Colon polyps Mother   . Colon cancer Mother   . Emphysema Father     smoker  . Heart disease Father   . Breast cancer Sister     Social History Social History  Substance Use Topics  . Smoking status: Never Smoker  . Smokeless tobacco: Never Used  . Alcohol use No     Allergies   Augmentin [amoxicillin-pot clavulanate]; Black cohosh; Codeine; Hyoscyamine; Oxybutynin chloride; and Minocycline   Review of Systems Review of Systems  Constitutional: Positive for fatigue. Negative for appetite change.  HENT: Positive for congestion.   Respiratory: Positive for cough and shortness of breath.   Cardiovascular: Negative for chest pain.  Gastrointestinal: Negative for abdominal pain.  Genitourinary: Negative for dysuria and frequency.  Musculoskeletal: Negative for gait problem.  Neurological: Negative for seizures.  Psychiatric/Behavioral: Negative for confusion.     Physical Exam Updated Vital Signs BP 129/61 (BP Location: Right Arm)   Pulse 80   Temp 97.7 F (36.5 C) (Oral)   Resp 18   SpO2 98%   Physical Exam  Constitutional: She appears well-developed.  HENT:  Head: Normocephalic.  Eyes: EOM are normal.  Neck: Neck supple.  Cardiovascular: Normal rate.   Pulmonary/Chest:  Somewhat harsh breath sounds diffusely with rales in bilateral bases.  Abdominal: There is no tenderness.  Musculoskeletal: She exhibits no edema.  Neurological: She is alert.  Skin: Skin is warm. Capillary refill takes less than 2 seconds.  Psychiatric: She has a normal mood and affect.     ED Treatments / Results  Labs (all labs ordered are listed, but only abnormal results are displayed) Labs Reviewed  CBC WITH DIFFERENTIAL/PLATELET  I-STAT CHEM 8, ED    EKG  EKG Interpretation None       Radiology No results found.  Procedures Procedures (including critical care  time)  Medications Ordered in ED Medications  ipratropium-albuterol (DUONEB) 0.5-2.5 (3) MG/3ML nebulizer solution 3 mL (not administered)     Initial Impression / Assessment and Plan / ED Course  I have reviewed the triage vital signs and the nursing notes.  Pertinent labs & imaging results that were available during my care of the patient were reviewed by me and considered in my medical decision making (see chart for details).  Clinical Course    Patient with dyspnea and cough. History of COPD and bronchiectasis. Antibiotics about a week ago azithromycin. States sputum is changed little bit. Not hypoxic on her 2 L of oxygen here. Has some crackles at base. Breathing treatment given. May require steroids for home if x-ray reassuring. May also require admission. Care turned over to Dr.James   Final Clinical Impressions(s) /  ED Diagnoses   Final diagnoses:  COPD exacerbation [facility])    New Prescriptions New Prescriptions   No medications on file     Davonna Belling, MD 12/22/15 1511

## 2015-12-22 NOTE — ED Triage Notes (Signed)
Patient from home had complaints of SOB esp with exertion. Has been bad for about a week and has increasing gotten worse. Uses o2 at home for copd. Had mild crackles. Had pneumonia 6 weeks ago. Then a week ago got bronchitis and it has not completely gone away. Fever and chills. NO pain.

## 2015-12-22 NOTE — Discharge Instructions (Signed)
Rx for Prednisone and Levaquin (Antibiotic).

## 2015-12-22 NOTE — ED Notes (Signed)
Bed: WA01 Expected date:  Expected time:  Means of arrival:  Comments: EMS-SOB 

## 2015-12-27 ENCOUNTER — Encounter: Payer: Self-pay | Admitting: Gastroenterology

## 2016-01-08 DIAGNOSIS — R5383 Other fatigue: Secondary | ICD-10-CM | POA: Diagnosis not present

## 2016-01-08 DIAGNOSIS — J479 Bronchiectasis, uncomplicated: Secondary | ICD-10-CM | POA: Diagnosis not present

## 2016-01-08 DIAGNOSIS — R0902 Hypoxemia: Secondary | ICD-10-CM | POA: Diagnosis not present

## 2016-01-08 DIAGNOSIS — J449 Chronic obstructive pulmonary disease, unspecified: Secondary | ICD-10-CM | POA: Diagnosis not present

## 2016-01-10 DIAGNOSIS — J189 Pneumonia, unspecified organism: Secondary | ICD-10-CM | POA: Diagnosis not present

## 2016-01-11 ENCOUNTER — Encounter: Payer: Self-pay | Admitting: Gastroenterology

## 2016-01-17 DIAGNOSIS — J189 Pneumonia, unspecified organism: Secondary | ICD-10-CM | POA: Diagnosis not present

## 2016-01-24 DIAGNOSIS — L603 Nail dystrophy: Secondary | ICD-10-CM | POA: Diagnosis not present

## 2016-01-24 DIAGNOSIS — Z1283 Encounter for screening for malignant neoplasm of skin: Secondary | ICD-10-CM | POA: Diagnosis not present

## 2016-01-24 DIAGNOSIS — B351 Tinea unguium: Secondary | ICD-10-CM | POA: Diagnosis not present

## 2016-02-03 DIAGNOSIS — H2513 Age-related nuclear cataract, bilateral: Secondary | ICD-10-CM | POA: Diagnosis not present

## 2016-02-03 DIAGNOSIS — H401122 Primary open-angle glaucoma, left eye, moderate stage: Secondary | ICD-10-CM | POA: Diagnosis not present

## 2016-02-03 DIAGNOSIS — H401111 Primary open-angle glaucoma, right eye, mild stage: Secondary | ICD-10-CM | POA: Diagnosis not present

## 2016-02-07 DIAGNOSIS — R5383 Other fatigue: Secondary | ICD-10-CM | POA: Diagnosis not present

## 2016-02-07 DIAGNOSIS — J449 Chronic obstructive pulmonary disease, unspecified: Secondary | ICD-10-CM | POA: Diagnosis not present

## 2016-02-07 DIAGNOSIS — R0902 Hypoxemia: Secondary | ICD-10-CM | POA: Diagnosis not present

## 2016-02-07 DIAGNOSIS — J479 Bronchiectasis, uncomplicated: Secondary | ICD-10-CM | POA: Diagnosis not present

## 2016-02-23 DIAGNOSIS — Z79899 Other long term (current) drug therapy: Secondary | ICD-10-CM | POA: Diagnosis not present

## 2016-02-23 DIAGNOSIS — I700 Atherosclerosis of aorta: Secondary | ICD-10-CM | POA: Diagnosis not present

## 2016-02-23 DIAGNOSIS — J309 Allergic rhinitis, unspecified: Secondary | ICD-10-CM | POA: Diagnosis not present

## 2016-02-23 DIAGNOSIS — Z0000 Encounter for general adult medical examination without abnormal findings: Secondary | ICD-10-CM | POA: Diagnosis not present

## 2016-02-23 DIAGNOSIS — E559 Vitamin D deficiency, unspecified: Secondary | ICD-10-CM | POA: Diagnosis not present

## 2016-02-23 DIAGNOSIS — E039 Hypothyroidism, unspecified: Secondary | ICD-10-CM | POA: Diagnosis not present

## 2016-02-23 DIAGNOSIS — J479 Bronchiectasis, uncomplicated: Secondary | ICD-10-CM | POA: Diagnosis not present

## 2016-02-23 DIAGNOSIS — I10 Essential (primary) hypertension: Secondary | ICD-10-CM | POA: Diagnosis not present

## 2016-02-23 DIAGNOSIS — H409 Unspecified glaucoma: Secondary | ICD-10-CM | POA: Diagnosis not present

## 2016-02-27 ENCOUNTER — Encounter: Payer: Self-pay | Admitting: Internal Medicine

## 2016-02-27 ENCOUNTER — Ambulatory Visit (INDEPENDENT_AMBULATORY_CARE_PROVIDER_SITE_OTHER): Payer: Commercial Managed Care - HMO | Admitting: Internal Medicine

## 2016-02-27 VITALS — BP 132/74 | HR 78 | Ht 67.5 in | Wt 137.8 lb

## 2016-02-27 DIAGNOSIS — J42 Unspecified chronic bronchitis: Secondary | ICD-10-CM | POA: Diagnosis not present

## 2016-02-27 DIAGNOSIS — J441 Chronic obstructive pulmonary disease with (acute) exacerbation: Secondary | ICD-10-CM

## 2016-02-27 DIAGNOSIS — J9611 Chronic respiratory failure with hypoxia: Secondary | ICD-10-CM | POA: Diagnosis not present

## 2016-02-27 NOTE — Progress Notes (Signed)
Patient ID: [name], female    DOB: 07/11/1940, 76 y.o.   MRN: [id number]  HPI  F never smoker with hx bronchitis/bronchiectasis and chronic recurrent hemoptysis,  + MAIC.. 09/12/12.complicated by sinusitis, Nocardia,  glaucoma, hx  Rx: 02/10/13- zith 500 mg TIW, EMB 1200 TIW, Rif 300 mg TIW. Ended by ID. Nocardia Rx'd Bactrim x 1 year Office spirometry 06/29/2014-moderate obstructive airways disease, FVC 2.17/69%, FEV1 1.39/60%, FEV1/FVC 64%, FEF 25-75 percent 0.75/4  ----------------------------------------------------------------------  02/27/2016-76 year old female never smoker followed for chronic bronchitis/bronchiectasis/ MAIC, chronic recurrent hemoptysis  positive MAIC 0000000 complicated by Nocardia, glaucoma, history sinusitis Rx: 02/10/13- zith 500 mg TIW, EMB 1200 TIW, Rif 300 mg TIW. Ended by ID. Nocardia Rx'd Bactrim x 1 year O2 2 L sleep/[employer] FOLLOWS FOR:Pt states she has slight SOB at times.Pt has not been wearing O2 lately-not for any particular reason.  Difficult past year with recurrent or persistent bronchitis mostly treated by her primary physician. Had pneumonia in November. ER visit. Now finally feels clear with no routine phlegm at little cough, no sweat or fever. Would like to change DME companies. Not clear how much she needs oxygen now. She doesn't feel any different if she leaves it off. CXR 12/22/2015 IMPRESSION: Chronic changes without acute abnormality.  Review of Systems-see HPI Constitutional:   No-   weight loss, night sweats, fevers, chills, fatigue, lassitude. HEENT:   No-  headaches, difficulty swallowing, tooth/dental problems, sore throat,       No- sneezing, no-itching, ear ache, nasal congestion, post nasal drip,  CV:  No-   chest pain, orthopnea, PND, swelling in lower extremities, anasarca, dizziness, palpitations Resp: +  shortness of breath with exertion or at rest.            +  productive cough,  + non-productive cough,  No-  recent coughing up of blood.               change in color of mucus.  No- wheezing.   Skin: No-   rash or lesions. GI:  No-   heartburn, indigestion, abdominal pain, nausea, vomiting,  GU:  MS:  No-   joint pain or swelling.  . Neuro-     nothing unusual Psych:  No- change in mood or affect. No depression or anxiety.  No memory loss.  Objective:   Physical Exam General- Alert, Oriented, Affect-cheerful, Distress- none acute. Trim. Looks well. Skin- rash-none, lesions- none, excoriation- none Lymphadenopathy- none Head- atraumatic            Eyes- Gross vision intact, PERRLA, conjunctivae clear secretions            Ears- Hearing, canals-normal            Nose- Clear, no-Septal dev, mucus, polyps, erosion, perforation             Throat- Mallampati II , mucosa clear , drainage- none, tonsils-  atrophic Neck- flexible , trachea midline, no stridor , thyroid nl, carotid no bruit Chest - symmetrical excursion , unlabored           Heart/CV- RRR/ occ extra beat , no murmur , no gallop  , no rub, nl s1 s2                           - JVD- none , edema- none, stasis changes- none, varices- none           Lung- + few crackles right upper  lung, unlabored, cough-none, dullness-none, rub- none           Chest wall-  Abd-  Br/ Gen/ Rectal- Not done, not indicated Extrem- cyanosis- none, clubbing, none, atrophy- none, strength- nl.  Neuro- grossly intact to observation

## 2016-02-27 NOTE — Patient Instructions (Signed)
Order-   DME Kerin Ransom on room air     Dx chronic bronchitis  If you haven't heard from [place] by a week after this test is done, please call so we can see if you still need oxygen at night.

## 2016-02-28 NOTE — Assessment & Plan Note (Signed)
She may no longer need oxygen for sleep Plan-overnight oximetry on room air. We can DC O2 if not needed. This is what she would want.

## 2016-02-28 NOTE — Assessment & Plan Note (Signed)
Not clear why she has had so much trouble over the past year. She seems to feel better currently and we will monitor

## 2016-03-05 DIAGNOSIS — J441 Chronic obstructive pulmonary disease with (acute) exacerbation: Secondary | ICD-10-CM | POA: Diagnosis not present

## 2016-03-09 DIAGNOSIS — R5383 Other fatigue: Secondary | ICD-10-CM | POA: Diagnosis not present

## 2016-03-09 DIAGNOSIS — R0902 Hypoxemia: Secondary | ICD-10-CM | POA: Diagnosis not present

## 2016-03-09 DIAGNOSIS — J479 Bronchiectasis, uncomplicated: Secondary | ICD-10-CM | POA: Diagnosis not present

## 2016-03-09 DIAGNOSIS — J449 Chronic obstructive pulmonary disease, unspecified: Secondary | ICD-10-CM | POA: Diagnosis not present

## 2016-03-13 ENCOUNTER — Telehealth: Payer: Self-pay | Admitting: *Deleted

## 2016-03-13 ENCOUNTER — Telehealth: Payer: Self-pay | Admitting: Internal Medicine

## 2016-03-13 ENCOUNTER — Ambulatory Visit (AMBULATORY_SURGERY_CENTER): Payer: Self-pay | Admitting: *Deleted

## 2016-03-13 VITALS — Ht 66.0 in | Wt 140.0 lb

## 2016-03-13 DIAGNOSIS — Z1211 Encounter for screening for malignant neoplasm of colon: Secondary | ICD-10-CM

## 2016-03-13 DIAGNOSIS — J471 Bronchiectasis with (acute) exacerbation: Secondary | ICD-10-CM

## 2016-03-13 MED ORDER — NA SULFATE-K SULFATE-MG SULF 17.5-3.13-1.6 GM/177ML PO SOLN: ORAL | 0 refills | Status: DC

## 2016-03-13 NOTE — Telephone Encounter (Signed)
I do not need to see her in the office.  However, GAS guidelines for [facility] are such that home oxygen use precludes having procedure in [place].  Please pose this question to Osvaldo Angst.  If there is uncertainty, then it would be best to move the patient's colonoscopy to the [facility] outpatient endoscopy lab at a later date.

## 2016-03-13 NOTE — Telephone Encounter (Signed)
I reviewed Dr Janee Morn note stating that the overnight oximetry was normal, so the patient no longer needs home oxygen. She is OK for colonoscopy in the [place].

## 2016-03-13 NOTE — Telephone Encounter (Signed)
Left message on pt's answering machine to proceed with colonoscopy as scheduled at [facility].

## 2016-03-13 NOTE — Telephone Encounter (Signed)
ONO received and placed in CY's look at on [employer] I have made pt aware that someone from our office will contact her with these results, once reviewed by Dr. Annamaria Boots.  CY please advise. Thanks.

## 2016-03-13 NOTE — Progress Notes (Signed)
No allergies to eggs or soy. No problems with anesthesia.  Pt given Emmi instructions for colonoscopy  Pt uses oxygen; note sent to Dr Loletha Carrow re: need for OV vs scheduling colonoscopy directly to [facility]  No diet drug use

## 2016-03-13 NOTE — Telephone Encounter (Signed)
Spoke with pt, aware of recs.  Order placed to pick up nocturnal 02 equipment from [place] at pt request.  Nothing further needed.

## 2016-03-13 NOTE — Telephone Encounter (Signed)
lmtcb x1 for pt. 

## 2016-03-13 NOTE — Telephone Encounter (Signed)
Her overnight oximetry was within normal. She does not need to sleep with O2.

## 2016-03-13 NOTE — Telephone Encounter (Signed)
Dr Loletha Carrow: I saw this 76 year old patient today in [place] for colonoscopy scheduled for 03/21/2016.  Last colonoscopy 2007; normal. Pt has no history of polyps Family hx colon polyps in mother; she says mother did not have colon cancer. Pt is currently using oxygen at bedtime for COPD.  Hospital admission in November for exacerbation of COPD.  Pt had OV with Dr Annamaria Boots 1/8 to discuss discontinuing oxygen. Visit is in [place].  Pt is awaiting results of overnight oximetry to see if she can discontinue oxygen.  In [place] today she has a cough and head congestion that is new. Do you want to see this patient in office because of complicated history before scheduling colonoscopy or is she okay for [place] if oxygen is discontinued?  Thanks, Juliann Pulse in [facility]

## 2016-03-13 NOTE — Telephone Encounter (Signed)
[phone] pt calling back

## 2016-03-13 NOTE — Telephone Encounter (Signed)
John: please review attached notes.  Pt is calling Dr Janee Morn office to see if oximetry results are back yet. She will call us back She is scheduled for 1/31. If oxygen has been discontinued is she okay for [facility]?  Thanks, Juliann Pulse

## 2016-03-21 ENCOUNTER — Ambulatory Visit (AMBULATORY_SURGERY_CENTER): Payer: Medicare HMO | Admitting: Gastroenterology

## 2016-03-21 ENCOUNTER — Encounter: Payer: Self-pay | Admitting: Gastroenterology

## 2016-03-21 VITALS — BP 125/66 | HR 71 | Temp 97.5°F | Resp 14 | Ht 66.0 in | Wt 140.0 lb

## 2016-03-21 DIAGNOSIS — Z1211 Encounter for screening for malignant neoplasm of colon: Secondary | ICD-10-CM

## 2016-03-21 DIAGNOSIS — Z1212 Encounter for screening for malignant neoplasm of rectum: Secondary | ICD-10-CM

## 2016-03-21 MED ORDER — SODIUM CHLORIDE 0.9 % IV SOLN: 500.0000 mL | INTRAVENOUS | Status: DC

## 2016-03-21 NOTE — Patient Instructions (Signed)
YOU HAD AN ENDOSCOPIC PROCEDURE TODAY AT THE [facility]:   Refer to the procedure report that was given to you for any specific questions about what was found during the examination.  If the procedure report does not answer your questions, please call your gastroenterologist to clarify.  If you requested that your care partner not be given the details of your procedure findings, then the procedure report has been included in a sealed envelope for you to review at your convenience later.  YOU SHOULD EXPECT: Some feelings of bloating in the abdomen. Passage of more gas than usual.  Walking can help get rid of the air that was put into your GI tract during the procedure and reduce the bloating. If you had a lower endoscopy (such as a colonoscopy or flexible sigmoidoscopy) you may notice spotting of blood in your stool or on the toilet paper. If you underwent a bowel prep for your procedure, you may not have a normal bowel movement for a few days.  Please Note:  You might notice some irritation and congestion in your nose or some drainage.  This is from the oxygen used during your procedure.  There is no need for concern and it should clear up in a day or so.  SYMPTOMS TO REPORT IMMEDIATELY:   Following lower endoscopy (colonoscopy or flexible sigmoidoscopy):  Excessive amounts of blood in the stool  Significant tenderness or worsening of abdominal pains  Swelling of the abdomen that is new, acute  Fever of 100F or higher    For urgent or emergent issues, a gastroenterologist can be reached at any hour by calling [phone].   DIET:  We do recommend a small meal at first, but then you may proceed to your regular diet.  Drink plenty of fluids but you should avoid alcoholic beverages for 24 hours.  ACTIVITY:  You should plan to take it easy for the rest of today and you should NOT DRIVE or use heavy machinery until tomorrow (because of the sedation medicines used during the test).     FOLLOW UP: Our staff will call the number listed on your records the next business day following your procedure to check on you and address any questions or concerns that you may have regarding the information given to you following your procedure. If we do not reach you, we will leave a message.  However, if you are feeling well and you are not experiencing any problems, there is no need to return our call.  We will assume that you have returned to your regular daily activities without incident.  If any biopsies were taken you will be contacted by phone or by letter within the next 1-3 weeks.  Please call us at [phone] if you have not heard about the biopsies in 3 weeks.    SIGNATURES/CONFIDENTIALITY: You and/or your care partner have signed paperwork which will be entered into your electronic medical record.  These signatures attest to the fact that that the information above on your After Visit Summary has been reviewed and is understood.  Full responsibility of the confidentiality of this discharge information lies with you and/or your care-partner.   Resume medications. Information given on diverticulosis. 

## 2016-03-21 NOTE — Progress Notes (Signed)
Pt's states no medical or surgical changes since previsit or office visit. 

## 2016-03-21 NOTE — Op Note (Signed)
[place] Patient Name: [name] Procedure Date: 03/21/2016 11:27 AM MRN: [id number] Endoscopist: [place]. Loletha Carrow , MD Age: 76 Referring MD:  Date of Birth: Aug 15, 1940 Gender: Female Account #: [id number] Procedure:                Colonoscopy Indications:              Screening for colorectal malignant neoplasm (no                            polyps on 01/2006 colonoscopy) Medicines:                Monitored Anesthesia Care Procedure:                Pre-Anesthesia Assessment:                           - Prior to the procedure, a History and Physical                            was performed, and patient medications and                            allergies were reviewed. The patient's tolerance of                            previous anesthesia was also reviewed. The risks                            and benefits of the procedure and the sedation                            options and risks were discussed with the patient.                            All questions were answered, and informed consent                            was obtained. Prior Anticoagulants: The patient has                            taken no previous anticoagulant or antiplatelet                            agents. ASA Grade Assessment: III - A patient with                            severe systemic disease. After reviewing the risks                            and benefits, the patient was deemed in                            satisfactory condition to undergo the procedure.  After obtaining informed consent, the colonoscope                            was passed under direct vision. Throughout the                            procedure, the patient's blood pressure, pulse, and                            oxygen saturations were monitored continuously. The                            Model PCF-H190DL [id number]) scope was introduced                            through the anus and  advanced to the the cecum,                            identified by appendiceal orifice and ileocecal                            valve. The colonoscopy was performed without                            difficulty. The patient tolerated the procedure                            well. The quality of the bowel preparation was                            excellent. The ileocecal valve, appendiceal                            orifice, and rectum were photographed. The quality                            of the bowel preparation was evaluated using the                            BBPS [facility] Bowel Preparation Scale) with scores                            of: Right Colon = 3, Transverse Colon = 3 and Left                            Colon = 3 (entire mucosa seen well with no residual                            staining, small fragments of stool or opaque                            liquid). The total BBPS score equals 9. The bowel  preparation used was SUPREP. Scope In: 11:33:18 AM Scope Out: 11:48:02 AM Scope Withdrawal Time: 0 hours 8 minutes 31 seconds  Total Procedure Duration: 0 hours 14 minutes 44 seconds  Findings:                 The digital rectal exam findings include decreased                            sphincter tone.                           A few small-mouthed diverticula were found in the                            left colon.                           The exam was otherwise without abnormality on                            direct and retroflexion views. Complications:            No immediate complications. Estimated Blood Loss:     Estimated blood loss: none. Impression:               - Decreased sphincter tone found on digital rectal                            exam.                           - Diverticulosis in the left colon.                           - The examination was otherwise normal on direct                            and retroflexion views.                            - No specimens collected. Recommendation:           - Patient has a contact number available for                            emergencies. The signs and symptoms of potential                            delayed complications were discussed with the                            patient. Return to normal activities tomorrow.                            Written discharge instructions were provided to the                            patient.                           -  Resume previous diet.                           - Continue present medications.                           - No repeat screening colonoscopy due to age. [name] L. Loletha Carrow, MD 03/21/2016 11:54:39 AM This report has been signed electronically.

## 2016-03-21 NOTE — Progress Notes (Signed)
A and O x3. Report to RN. Tolerated MAC anesthesia well.

## 2016-03-22 ENCOUNTER — Encounter: Payer: Self-pay | Admitting: Internal Medicine

## 2016-03-22 ENCOUNTER — Telehealth: Payer: Self-pay

## 2016-03-22 NOTE — Telephone Encounter (Signed)
  Follow up Call-  Call back number 03/21/2016  Post procedure Call Back phone  # [phone]  Permission to leave phone message Yes  Some recent data might be hidden     Patient questions:  Do you have a fever, pain , or abdominal swelling? No. Pain Score  0 *  Have you tolerated food without any problems? Yes.    Have you been able to return to your normal activities? Yes.    Do you have any questions about your discharge instructions: Diet   No. Medications  No. Follow up visit  No.  Do you have questions or concerns about your Care? No.  Actions: * If pain score is 4 or above: No action needed, pain <4.

## 2016-04-20 DIAGNOSIS — R6889 Other general symptoms and signs: Secondary | ICD-10-CM | POA: Diagnosis not present

## 2016-04-20 DIAGNOSIS — J111 Influenza due to unidentified influenza virus with other respiratory manifestations: Secondary | ICD-10-CM | POA: Diagnosis not present

## 2016-05-03 DIAGNOSIS — H401122 Primary open-angle glaucoma, left eye, moderate stage: Secondary | ICD-10-CM | POA: Diagnosis not present

## 2016-05-03 DIAGNOSIS — H2513 Age-related nuclear cataract, bilateral: Secondary | ICD-10-CM | POA: Diagnosis not present

## 2016-05-03 DIAGNOSIS — H401111 Primary open-angle glaucoma, right eye, mild stage: Secondary | ICD-10-CM | POA: Diagnosis not present

## 2016-05-08 DIAGNOSIS — J218 Acute bronchiolitis due to other specified organisms: Secondary | ICD-10-CM | POA: Diagnosis not present

## 2016-05-08 DIAGNOSIS — J988 Other specified respiratory disorders: Secondary | ICD-10-CM | POA: Diagnosis not present

## 2016-05-08 DIAGNOSIS — R05 Cough: Secondary | ICD-10-CM | POA: Diagnosis not present

## 2016-05-08 DIAGNOSIS — J440 Chronic obstructive pulmonary disease with acute lower respiratory infection: Secondary | ICD-10-CM | POA: Diagnosis not present

## 2016-05-10 DIAGNOSIS — J069 Acute upper respiratory infection, unspecified: Secondary | ICD-10-CM | POA: Diagnosis not present

## 2016-05-10 DIAGNOSIS — J449 Chronic obstructive pulmonary disease, unspecified: Secondary | ICD-10-CM | POA: Diagnosis not present

## 2016-06-08 ENCOUNTER — Other Ambulatory Visit: Payer: Self-pay | Admitting: Family Medicine

## 2016-06-08 DIAGNOSIS — N6310 Unspecified lump in the right breast, unspecified quadrant: Secondary | ICD-10-CM

## 2016-06-08 DIAGNOSIS — J441 Chronic obstructive pulmonary disease with (acute) exacerbation: Secondary | ICD-10-CM | POA: Diagnosis not present

## 2016-06-12 ENCOUNTER — Ambulatory Visit
Admission: RE | Admit: 2016-06-12 | Discharge: 2016-06-12 | Disposition: A | Discharge status: Home / Self Care | Payer: Medicare HMO | Source: Ambulatory Visit | Attending: Family Medicine | Admitting: Family Medicine

## 2016-06-12 ENCOUNTER — Other Ambulatory Visit: Payer: Self-pay | Admitting: Family Medicine

## 2016-06-12 DIAGNOSIS — N6310 Unspecified lump in the right breast, unspecified quadrant: Secondary | ICD-10-CM

## 2016-06-12 DIAGNOSIS — R928 Other abnormal and inconclusive findings on diagnostic imaging of breast: Secondary | ICD-10-CM | POA: Diagnosis not present

## 2016-06-12 DIAGNOSIS — N6011 Diffuse cystic mastopathy of right breast: Secondary | ICD-10-CM | POA: Diagnosis not present

## 2016-06-12 DIAGNOSIS — N6489 Other specified disorders of breast: Secondary | ICD-10-CM | POA: Diagnosis not present

## 2016-06-12 HISTORY — PX: BREAST CYST ASPIRATION: SHX578

## 2016-08-27 ENCOUNTER — Ambulatory Visit: Payer: Self-pay | Admitting: Internal Medicine

## 2016-09-03 DIAGNOSIS — J069 Acute upper respiratory infection, unspecified: Secondary | ICD-10-CM | POA: Diagnosis not present

## 2016-09-19 DIAGNOSIS — J441 Chronic obstructive pulmonary disease with (acute) exacerbation: Secondary | ICD-10-CM | POA: Diagnosis not present

## 2016-10-01 ENCOUNTER — Other Ambulatory Visit: Payer: Self-pay | Admitting: Family Medicine

## 2016-10-01 DIAGNOSIS — Z1231 Encounter for screening mammogram for malignant neoplasm of breast: Secondary | ICD-10-CM

## 2016-10-02 ENCOUNTER — Ambulatory Visit
Admission: RE | Admit: 2016-10-02 | Discharge: 2016-10-02 | Disposition: A | Discharge status: Home / Self Care | Payer: Medicare HMO | Source: Ambulatory Visit | Attending: Family Medicine | Admitting: Family Medicine

## 2016-10-02 DIAGNOSIS — Z1231 Encounter for screening mammogram for malignant neoplasm of breast: Secondary | ICD-10-CM | POA: Diagnosis not present

## 2016-11-08 DIAGNOSIS — H401132 Primary open-angle glaucoma, bilateral, moderate stage: Secondary | ICD-10-CM | POA: Diagnosis not present

## 2016-11-08 DIAGNOSIS — H2513 Age-related nuclear cataract, bilateral: Secondary | ICD-10-CM | POA: Diagnosis not present

## 2016-11-23 ENCOUNTER — Ambulatory Visit (INDEPENDENT_AMBULATORY_CARE_PROVIDER_SITE_OTHER): Payer: Medicare HMO | Admitting: Internal Medicine

## 2016-11-23 ENCOUNTER — Encounter: Payer: Self-pay | Admitting: Internal Medicine

## 2016-11-23 ENCOUNTER — Ambulatory Visit (INDEPENDENT_AMBULATORY_CARE_PROVIDER_SITE_OTHER)
Admission: RE | Admit: 2016-11-23 | Discharge: 2016-11-23 | Disposition: A | Discharge status: Home / Self Care | Payer: Medicare HMO | Source: Ambulatory Visit | Attending: Internal Medicine | Admitting: Internal Medicine

## 2016-11-23 VITALS — BP 112/60 | HR 77 | Ht 67.5 in | Wt 137.6 lb

## 2016-11-23 DIAGNOSIS — J479 Bronchiectasis, uncomplicated: Secondary | ICD-10-CM | POA: Diagnosis not present

## 2016-11-23 DIAGNOSIS — J449 Chronic obstructive pulmonary disease, unspecified: Secondary | ICD-10-CM | POA: Diagnosis not present

## 2016-11-23 DIAGNOSIS — J310 Chronic rhinitis: Secondary | ICD-10-CM

## 2016-11-23 IMAGING — DX DG CHEST 2V
2 series · 2 of 2 positions shown · non-contrast
Comparison: [DATE] chest radiograph.

CLINICAL DATA: COPD.  Bronchiectasis.  Chronic dyspnea on exertion.

EXAM:
CHEST  2 VIEW

[chest pa]
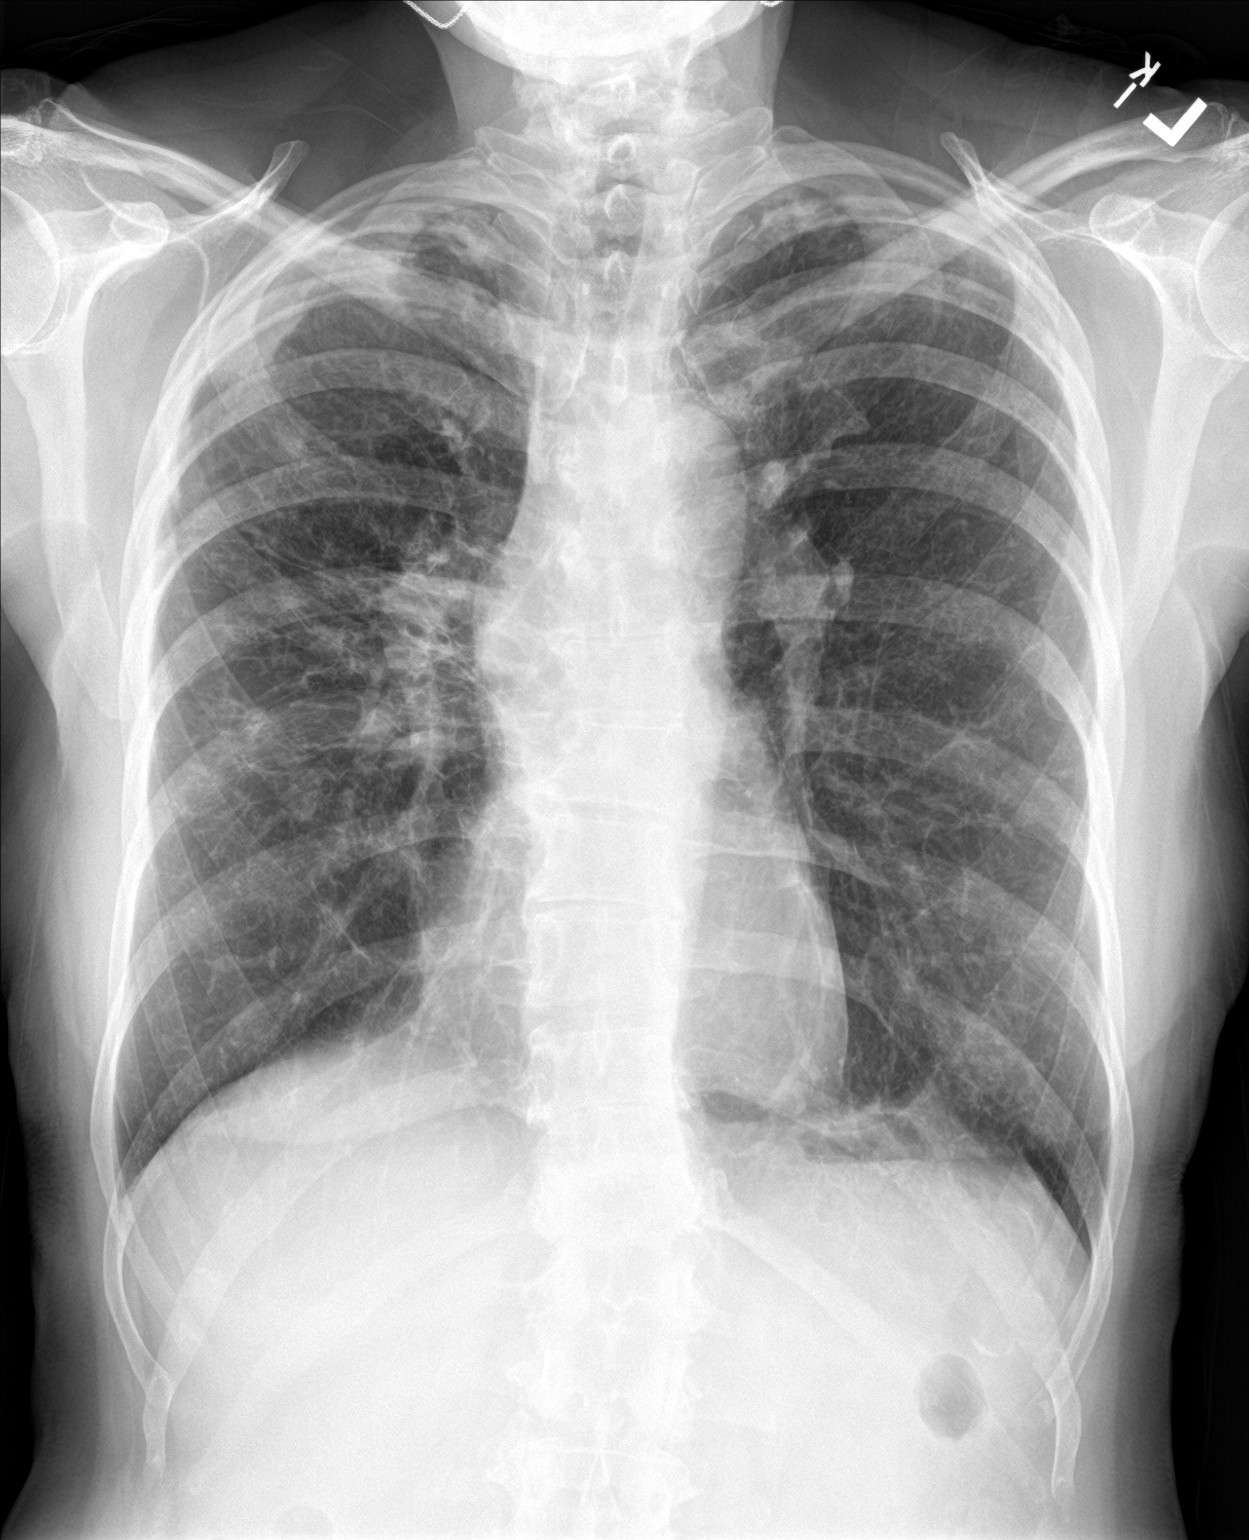

[chest lat]
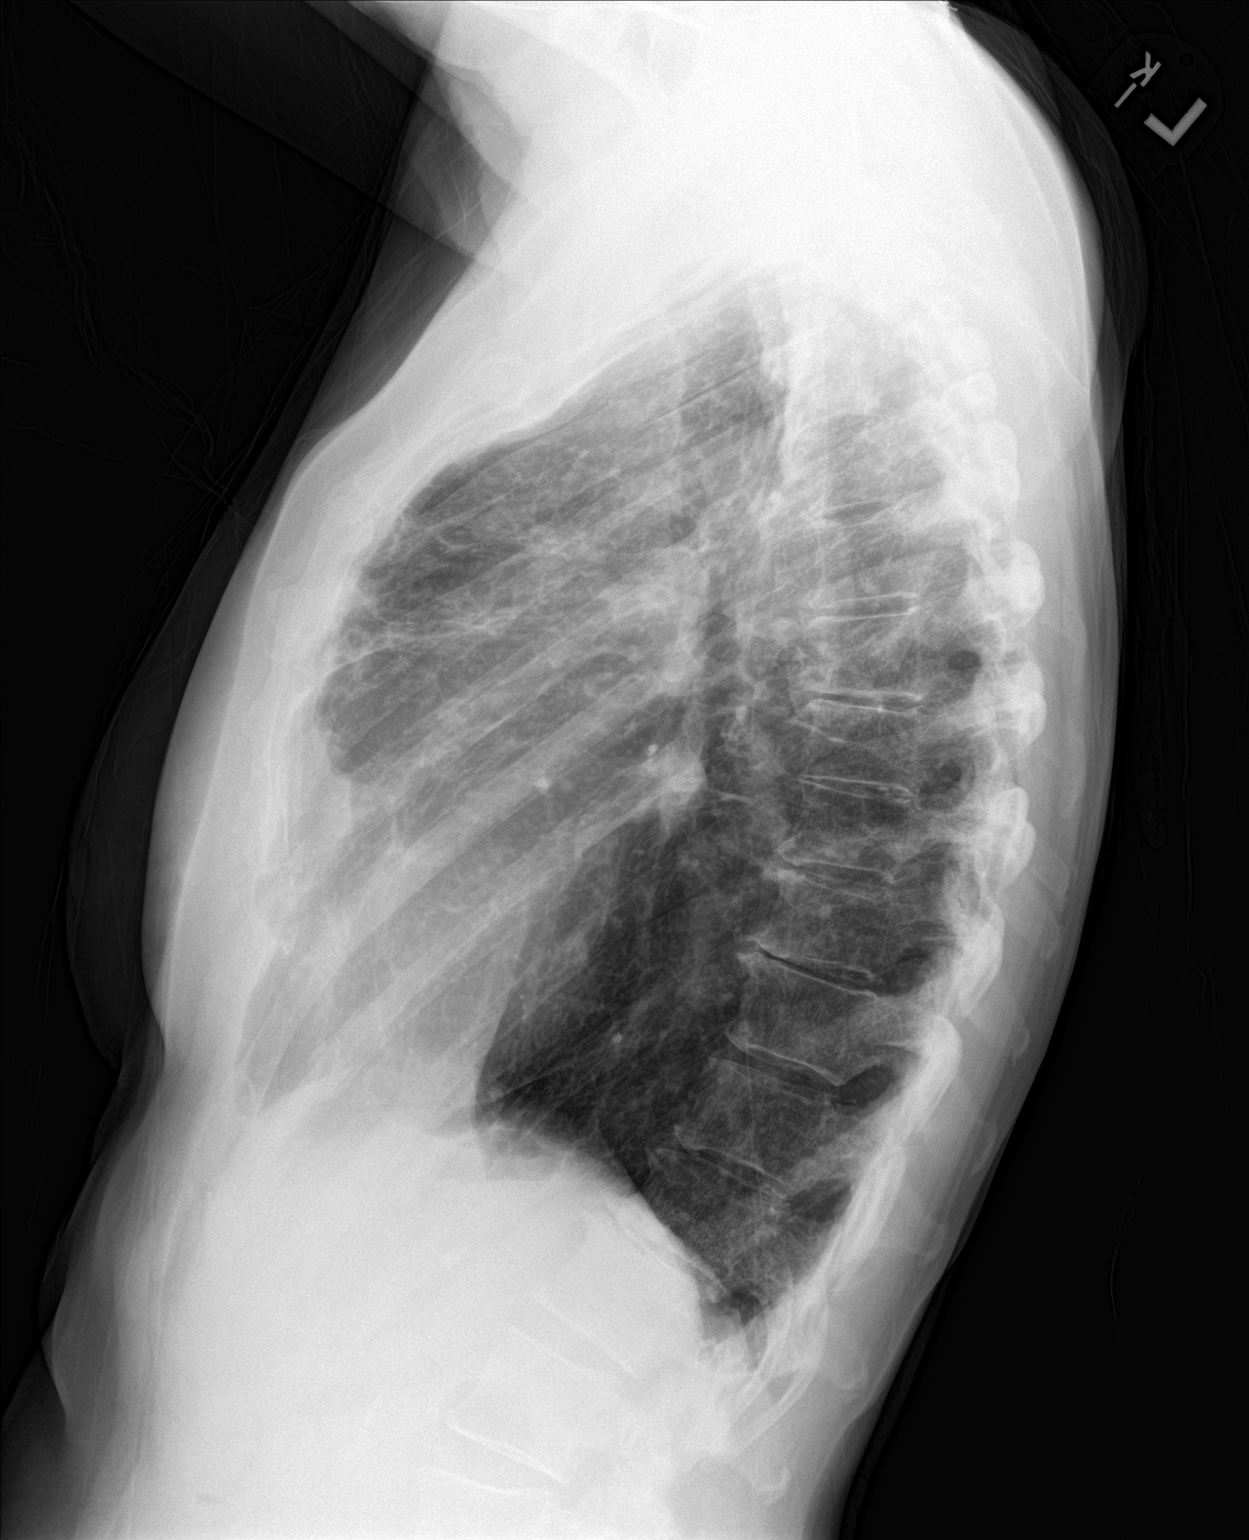

[2 of 2 positions shown; findings below may reference images not displayed]

FINDINGS: Stable cardiomediastinal silhouette with normal heart size. No
pneumothorax. No pleural effusion. Scattered tubular lucencies in
the right greater than left lungs compatible with known
bronchiectasis, stable. Mild parenchymal distortion in both lungs,
right greater than left, unchanged. Scattered patchy reticulonodular
opacities in the parahilar and upper lungs, right greater than left,
not appreciably changed. No acute consolidative airspace disease. No
pulmonary edema. Stable biapical pleural-parenchymal scarring.
IMPRESSION: No appreciable interval change. Stable findings of scattered
bronchiectasis, scarring and reticulonodular opacities, compatible
with chronic infectious bronchiolitis due to atypical mycobacterial
infection (GUNZ) as characterized on [DATE] high-resolution chest
CT study.

## 2016-11-23 MED ORDER — AZELASTINE-FLUTICASONE 137-50 MCG/ACT NA SUSP: 2.0000 | Freq: Every day | NASAL | 0 refills | Status: DC

## 2016-11-23 NOTE — Progress Notes (Signed)
Patient ID: [name], female    DOB: [birth year], 76 y.o.   MRN: [id number]  HPI  F never smoker with hx bronchitis/bronchiectasis and chronic recurrent hemoptysis,  + MAIC.. 09/12/12.complicated by sinusitis, Nocardia,  glaucoma, hx sinusistis  Rx: 02/10/13- zith 500 mg TIW, EMB 1200 TIW, Rif 300 mg TIW. Ended by ID. Nocardia Rx'd Bactrim x 1 year Office spirometry 06/29/2014-moderate obstructive airways disease, FVC 2.17/69%, FEV1 1.39/60%, FEV1/FVC 64%, FEF 25-75 percent 0.75/4  ----------------------------------------------------------------------  02/27/2016-76 year old female never smoker followed for chronic bronchitis/bronchiectasis/ MAIC, chronic recurrent hemoptysis  positive MAIC 95/63/8756 complicated by Nocardia, glaucoma, history sinusitis Rx: 02/10/13- zith 500 mg TIW, EMB 1200 TIW, Rif 300 mg TIW. Ended by ID. Nocardia Rx'd Bactrim x 1 year O2 2 L sleep/[employer] FOLLOWS FOR:Pt states she has slight SOB at times.Pt has not been wearing O2 lately-not for any particular reason.  Difficult past year with recurrent or persistent bronchitis mostly treated by her primary physician. Had pneumonia in November. ER visit. Now finally feels clear with no routine phlegm at little cough, no sweat or fever. Would like to change DME companies. Not clear how much she needs oxygen now. She doesn't feel any different if she leaves it off. CXR 12/22/2015 IMPRESSION: Chronic changes without acute abnormality.  11/23/16- 76 year old female never smoker followed for chronic bronchitis/bronchiectasis/ MAIC, chronic recurrent hemoptysis  positive MAIC 43/32/9518 complicated by Nocardia, glaucoma, history sinusitis Rx: 02/10/13- zith 500 mg TIW, EMB 1200 TIW, Rif 300 mg TIW. Ended by ID. Nocardia Rx'd Bactrim x 1 year O2 2 L sleep/[employer]   dc'd  ONOX 03/05/16- No longer qualified Chronic bronchitis; Pt states she was sick in March but since then she feels great. Pt notes chest congestion at times  and wonders if its related to her COPD.  Advair 250, albuterol HFA Had flu and pneumonia treated as an outpatient without CXR by PCP in March. Since then has felt well. Some dry cough most days. Dyspnea on exertion with hills and stairs. Infrequent use of rescue inhaler-not lately. She is followed twice a year by ophthalmology because of glaucoma and cataracts. Occasional nasal stuffiness especially in the mornings, watery rhinorrhea most days.  Review of Systems-see HPI   + = positive Constitutional:   No-   weight loss, night sweats, fevers, chills, fatigue, lassitude. HEENT:   No-  headaches, difficulty swallowing, tooth/dental problems, sore throat,       No- sneezing, no-itching, ear ache, +congestion, post nasal drip+,  CV:  No-   chest pain, orthopnea, PND, swelling in lower extremities, anasarca, dizziness, palpitations Resp: +  shortness of breath with exertion or at rest.              productive cough,  + non-productive cough,  No- recent coughing up of blood.               change in color of mucus.  No- wheezing.   Skin: No-   rash or lesions. GI:  No-   heartburn, indigestion, abdominal pain, nausea, vomiting,  GU:  MS:  No-   joint pain or swelling.  . Neuro-     nothing unusual Psych:  No- change in mood or affect. No depression or anxiety.  No memory loss.  Objective:   Physical Exam General- Alert, Oriented, Affect-cheerful, Distress- none acute. Trim. Looks well. Skin- rash-none, lesions- none, excoriation- none Lymphadenopathy- none Head- atraumatic            Eyes- Gross vision intact, PERRLA, conjunctivae  clear secretions            Ears- Hearing, canals-normal            Nose- Clear, no-Septal dev, mucus, polyps, erosion, perforation             Throat- Mallampati II , mucosa clear , drainage- none, tonsils-  atrophic Neck- flexible , trachea midline, no stridor , thyroid nl, carotid no bruit Chest - symmetrical excursion , unlabored           Heart/CV- RRR/ occ  extra beat , no murmur , no gallop  , no rub, nl s1 s2                           - JVD- none , edema- none, stasis changes- none, varices- none           Lung- + Scattered crackle and squeak, unlabored, cough+, dullness-none, rub- none           Chest wall-  Abd-  Br/ Gen/ Rectal- Not done, not indicated Extrem- cyanosis- none, clubbing, none, atrophy- none, strength- nl.  Neuro- grossly intact to observation

## 2016-11-23 NOTE — Assessment & Plan Note (Signed)
She has at least moderate obstructive airways disease. I would prefer to use a LABA/LAMA rather than her Advair LABA/ ICS, but I'm concerned about her glaucoma. We discussed this. Plan-CXR

## 2016-11-23 NOTE — Patient Instructions (Signed)
Order- CXR   Dx COPD mixed type, bronchiectasis   Sample Dymista nasal spray         1-2 puffs each nostril, up to twice daily as needed  Please call if we can help

## 2016-11-23 NOTE — Assessment & Plan Note (Signed)
This is mostly nonallergic/vasomotor although there may be an allergic component as well. Plan-sample Dymista nasal spray for trial

## 2017-02-10 DIAGNOSIS — J069 Acute upper respiratory infection, unspecified: Secondary | ICD-10-CM | POA: Diagnosis not present

## 2017-02-25 ENCOUNTER — Telehealth: Payer: Self-pay | Admitting: Internal Medicine

## 2017-02-25 NOTE — Telephone Encounter (Signed)
Notes recorded by Riley Kill, CMA on 11/26/2016 at 5:08 PM EDT Patient is aware of results. Verbalized understanding. ------  Notes recorded by Deneise Lever, MD on 11/23/2016 at 4:27 PM EDT CXR- stable with old changes of COPD, bronchiectasis and scarring as before. Nothing looks like active MAIC infection.  LMTCB x1 for pt

## 2017-02-26 NOTE — Telephone Encounter (Signed)
lmtcb for pt.  

## 2017-02-26 NOTE — Telephone Encounter (Signed)
Pt returning call. Cb is [phone].

## 2017-02-26 NOTE — Telephone Encounter (Signed)
lmtcb X2 for patient.

## 2017-02-27 NOTE — Telephone Encounter (Signed)
Pt is aware of results and voiced her understanding. Nothing further is needed.  

## 2017-03-11 DIAGNOSIS — J471 Bronchiectasis with (acute) exacerbation: Secondary | ICD-10-CM | POA: Diagnosis not present

## 2017-03-26 DIAGNOSIS — E7800 Pure hypercholesterolemia, unspecified: Secondary | ICD-10-CM | POA: Diagnosis not present

## 2017-03-26 DIAGNOSIS — Z79899 Other long term (current) drug therapy: Secondary | ICD-10-CM | POA: Diagnosis not present

## 2017-03-26 DIAGNOSIS — E039 Hypothyroidism, unspecified: Secondary | ICD-10-CM | POA: Diagnosis not present

## 2017-03-26 DIAGNOSIS — Z0000 Encounter for general adult medical examination without abnormal findings: Secondary | ICD-10-CM | POA: Diagnosis not present

## 2017-03-26 DIAGNOSIS — E559 Vitamin D deficiency, unspecified: Secondary | ICD-10-CM | POA: Diagnosis not present

## 2017-05-09 DIAGNOSIS — H401132 Primary open-angle glaucoma, bilateral, moderate stage: Secondary | ICD-10-CM | POA: Diagnosis not present

## 2017-05-09 DIAGNOSIS — H2513 Age-related nuclear cataract, bilateral: Secondary | ICD-10-CM | POA: Diagnosis not present

## 2017-05-12 DIAGNOSIS — R05 Cough: Secondary | ICD-10-CM | POA: Diagnosis not present

## 2017-05-12 DIAGNOSIS — J441 Chronic obstructive pulmonary disease with (acute) exacerbation: Secondary | ICD-10-CM | POA: Diagnosis not present

## 2017-05-14 ENCOUNTER — Other Ambulatory Visit: Payer: Self-pay | Admitting: Family Medicine

## 2017-05-14 DIAGNOSIS — R911 Solitary pulmonary nodule: Secondary | ICD-10-CM

## 2017-05-17 DIAGNOSIS — R05 Cough: Secondary | ICD-10-CM | POA: Diagnosis not present

## 2017-05-20 DIAGNOSIS — J471 Bronchiectasis with (acute) exacerbation: Secondary | ICD-10-CM | POA: Diagnosis not present

## 2017-05-24 ENCOUNTER — Telehealth: Payer: Self-pay | Admitting: Internal Medicine

## 2017-05-24 NOTE — Telephone Encounter (Signed)
CY advised that we have recent CXR report on 04/2017 but not sure which doctor ordered the CXR for pt. We are needing to know who ordered it, and need a disc sent to [facility] for review  LVM for s/o at [facility] to return call regarding these concerns  Routing message to [facility] to f/u next week

## 2017-05-27 DIAGNOSIS — Z780 Asymptomatic menopausal state: Secondary | ICD-10-CM | POA: Diagnosis not present

## 2017-05-27 DIAGNOSIS — E2839 Other primary ovarian failure: Secondary | ICD-10-CM | POA: Diagnosis not present

## 2017-06-04 ENCOUNTER — Ambulatory Visit
Admission: RE | Admit: 2017-06-04 | Discharge: 2017-06-04 | Disposition: A | Discharge status: Home / Self Care | Payer: Medicare HMO | Source: Ambulatory Visit | Attending: Family Medicine | Admitting: Family Medicine

## 2017-06-04 DIAGNOSIS — R918 Other nonspecific abnormal finding of lung field: Secondary | ICD-10-CM | POA: Diagnosis not present

## 2017-06-04 DIAGNOSIS — R911 Solitary pulmonary nodule: Secondary | ICD-10-CM

## 2017-06-04 IMAGING — CT CT CHEST W/O CM
1 of 2 series · 14 of 32 positions shown, 18 images · non-contrast
Comparison: Chest CT [DATE].

CLINICAL DATA: 69-year-old female with history of cough for the
past 3 weeks, treated with antibiotics and steroids. History of
COPD.

EXAM:
CT CHEST WITHOUT CONTRAST
TECHNIQUE: Multidetector CT imaging of the chest was performed following the
standard protocol without IV contrast.

[Series 2: chest w/(date) · axial · 0.54mm/px · z∈[+1004,+1268]mm · 14 of 156 slices shown, 18 images]
[im 12/156  mediastinal]
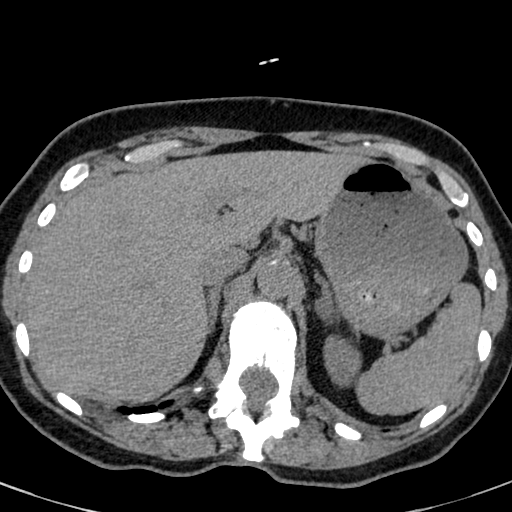
[im 12/156  lung]
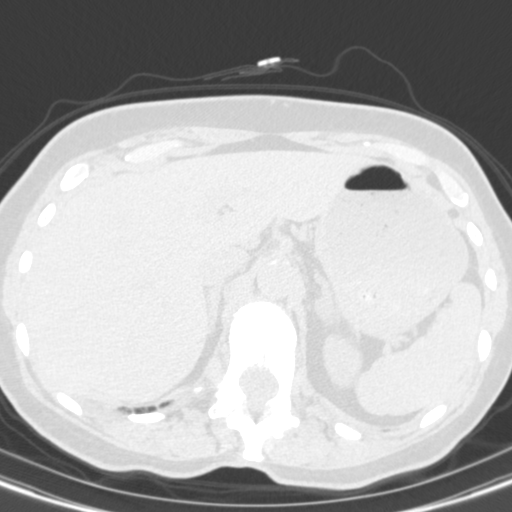
[im 24/156  lung]
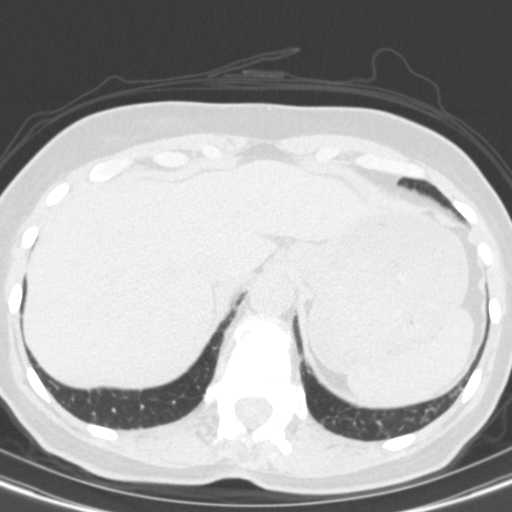
[im 36/156  lung]
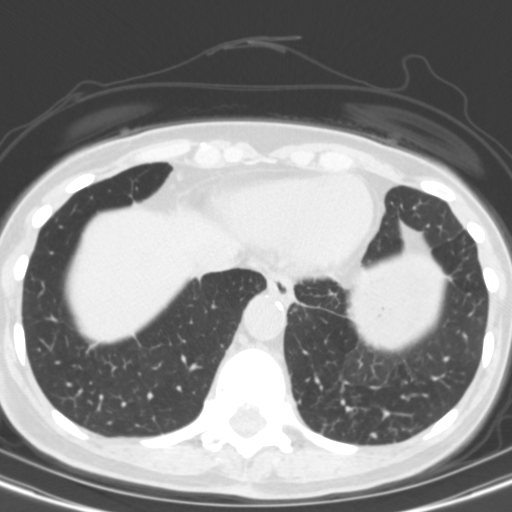
[im 48/156  lung]
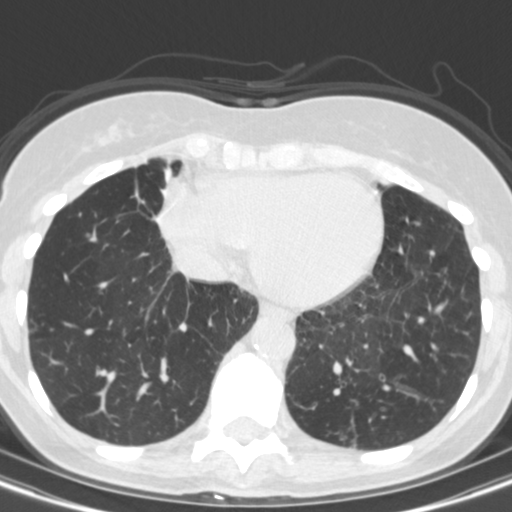
[im 60/156  mediastinal]
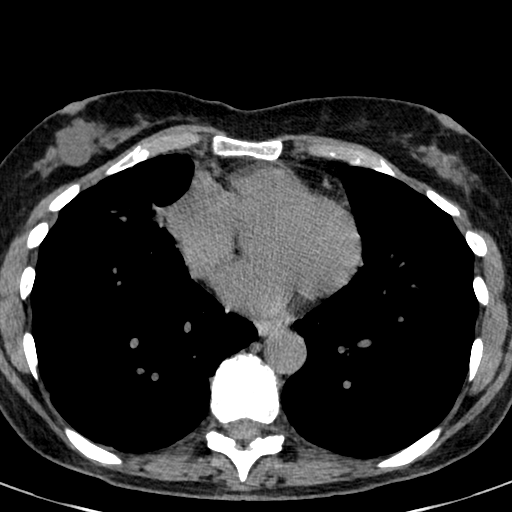
[im 60/156  lung]
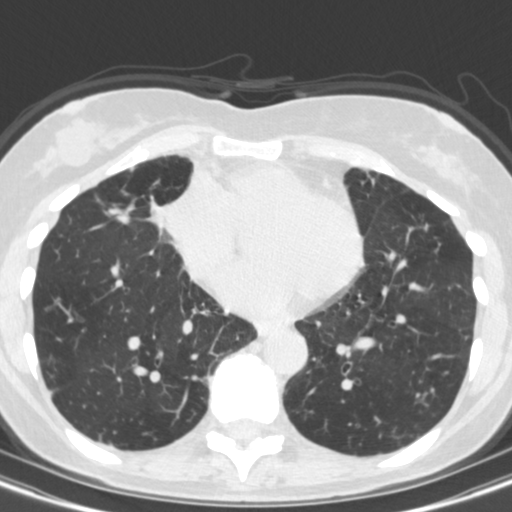
[im 72/156  lung]
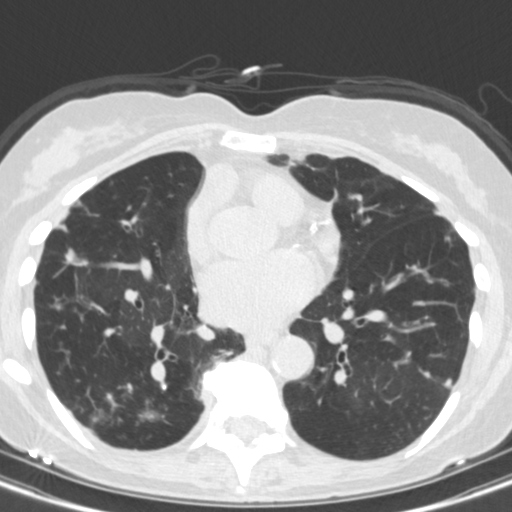
[im 74/156  lung]
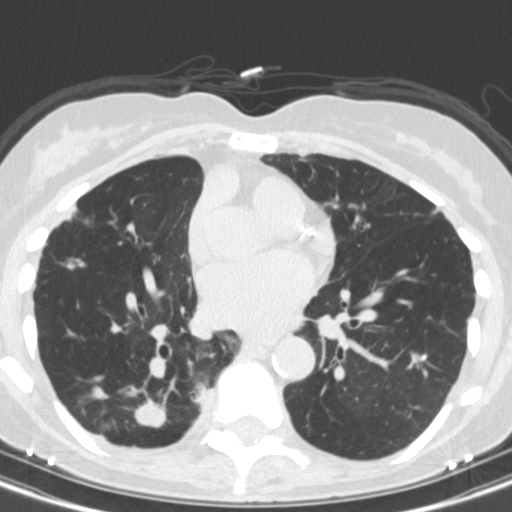
[im 78/156  lung]
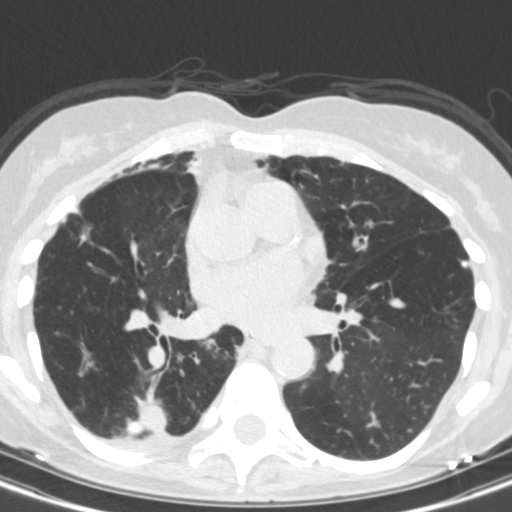
[im 84/156  mediastinal]
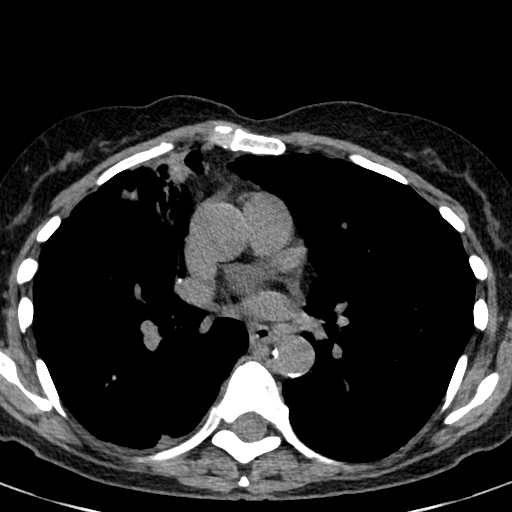
[im 84/156  lung]
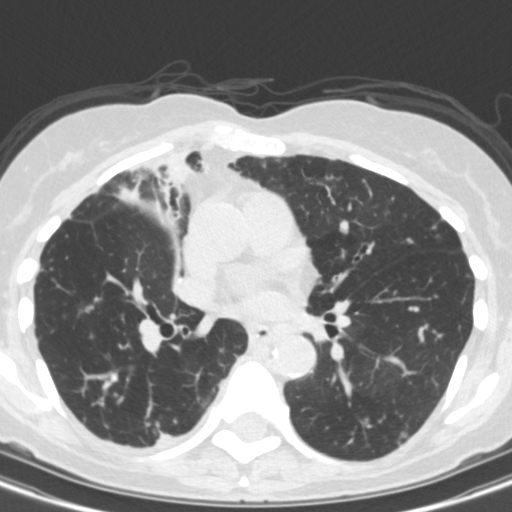
[im 96/156  lung]
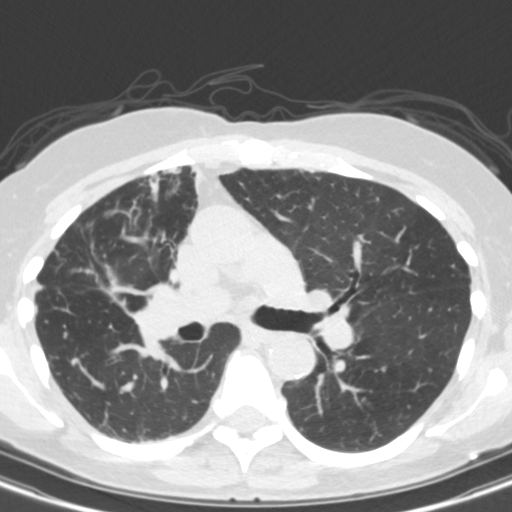
[im 108/156  lung]
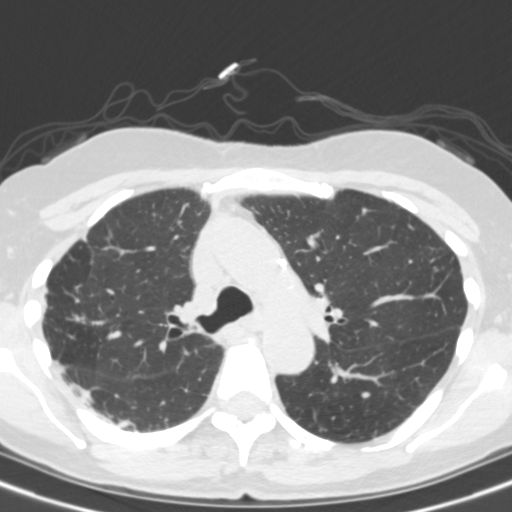
[im 120/156  lung]
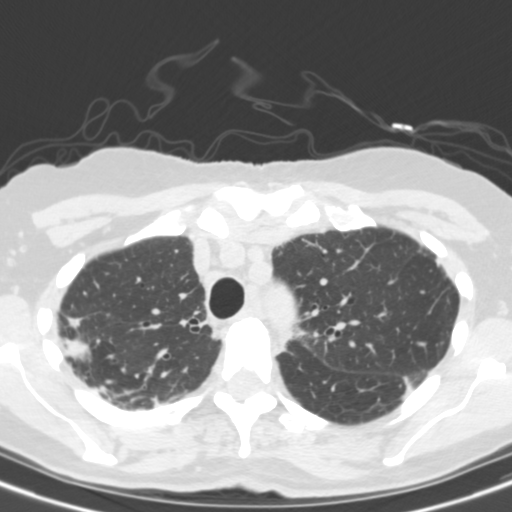
[im 132/156  mediastinal]
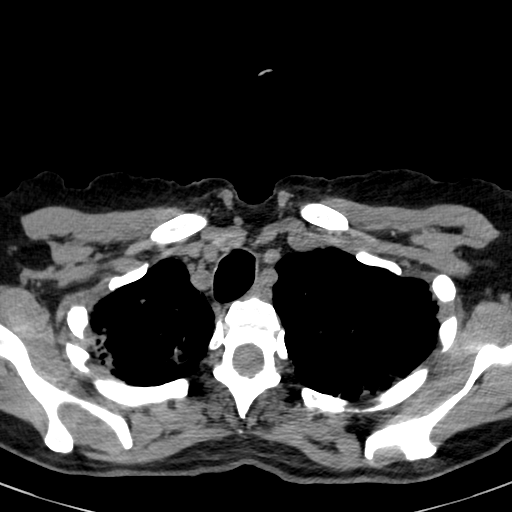
[im 132/156  lung]
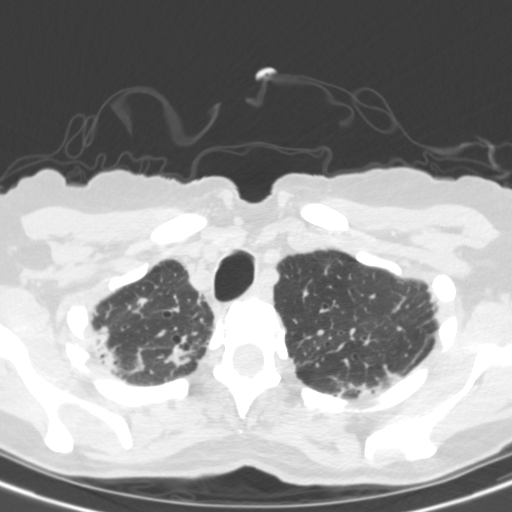
[im 144/156  lung]
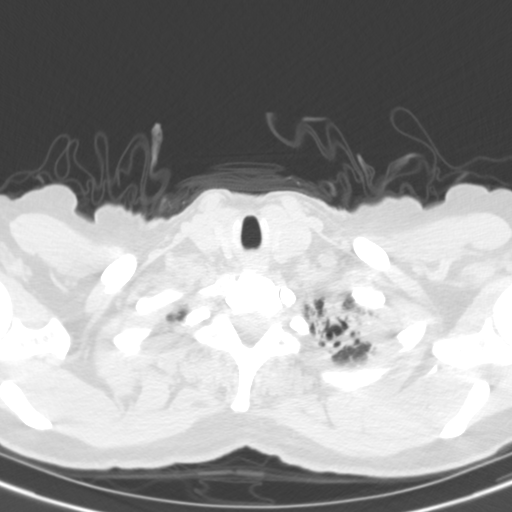

[14 of 32 positions shown; findings below may reference images not displayed]

FINDINGS: Cardiovascular: Heart size is normal. There is no significant
pericardial fluid, thickening or pericardial calcification. There is
aortic atherosclerosis, as well as atherosclerosis of the great
vessels of the mediastinum and the coronary arteries, including
calcified atherosclerotic plaque in the left anterior descending,
left circumflex and right coronary arteries.

Mediastinum/Nodes: No pathologically enlarged mediastinal or hilar
lymph nodes. Please note that accurate exclusion of hilar adenopathy
is limited on noncontrast CT scans. Esophagus is unremarkable in
appearance. No axillary lymphadenopathy.

Lungs/Pleura: Much like the prior examination from [DATE] there
is widespread bronchial wall thickening, patchy areas of cylindrical
and mild varicose bronchiectasis with extensive peribronchovascular
micro and macronodularity. These findings have increased compared to
the prior study, and are most severe in the right lung with
extensive bronchiectasis and chronic volume loss in the right middle
lobe, as well as worsening macro nodularity throughout the right
lung. The largest of these nodules has an aggressive appearance with
macrolobulation and mild surrounding spiculation in the right lower
lobe (axial image 80 of series 5) measuring 2.3 x 2.2 cm, new
compared to the prior examination. Trace right pleural effusion
lying dependently. No confluent consolidative airspace disease.
Bilateral areas of apical nodular pleuroparenchymal thickening and
architectural distortion, some of which is calcified, most
compatible with chronic post infectious or inflammatory scarring.

Upper Abdomen: Aortic atherosclerosis.

Musculoskeletal: There are no aggressive appearing lytic or blastic
lesions noted in the visualized portions of the skeleton.
IMPRESSION: 1. The overall appearance of the lungs is suggestive of a chronic
indolent atypical infectious process, which appears progressive
compared to the prior study [DATE]. Amongst the many nodules in
the lungs there is a dominant right lower lobe nodule (axial image
80 of series 5) measuring 2.3 x 2.2 cm. Despite the aggressive
appearance of this nodule, this is still favored to be part of the
same underlying infectious process. Given the high likelihood of
false positivity on PET-CT, further evaluation with repeat
noncontrast chest CT is recommended in 3 months to reassess this
lesion and ensure stability or regression.
2. Aortic atherosclerosis, in addition to 3 vessel coronary artery
disease. Assessment for potential risk factor modification, dietary
therapy or pharmacologic therapy may be warranted, if clinically
indicated.

Aortic Atherosclerosis ([GS]-[GS]).

## 2017-06-13 ENCOUNTER — Telehealth: Payer: Self-pay | Admitting: Internal Medicine

## 2017-06-13 DIAGNOSIS — R911 Solitary pulmonary nodule: Secondary | ICD-10-CM

## 2017-06-13 NOTE — Telephone Encounter (Signed)
Received results from recent Ct. Doctor Annamaria Boots had me call and follow up with doctor [place]alt[place] office to she if they would be following up in three months with a CT or would she like DR. Young to follow up on this. I have  Left a message for DR. Wolters nurse to get back to me in regards to this. Will follow up.

## 2017-06-14 NOTE — Telephone Encounter (Signed)
Received a phone call from Dr. Stephanie Acre office she said she would like Dr. Annamaria Boots to take over see this and follow up with the ct in 3 months. I have let Sadie Haber. CMA know what Dr. Cheron Schaumann has said will forward this to DR. Young as a Pharmacist, hospital.    Will place orders for CT without contrast in 3 months.

## 2017-08-01 DIAGNOSIS — J449 Chronic obstructive pulmonary disease, unspecified: Secondary | ICD-10-CM | POA: Diagnosis not present

## 2017-08-01 DIAGNOSIS — N3000 Acute cystitis without hematuria: Secondary | ICD-10-CM | POA: Diagnosis not present

## 2017-08-01 DIAGNOSIS — R3989 Other symptoms and signs involving the genitourinary system: Secondary | ICD-10-CM | POA: Diagnosis not present

## 2017-08-13 DIAGNOSIS — H401132 Primary open-angle glaucoma, bilateral, moderate stage: Secondary | ICD-10-CM | POA: Diagnosis not present

## 2017-08-13 DIAGNOSIS — H2513 Age-related nuclear cataract, bilateral: Secondary | ICD-10-CM | POA: Diagnosis not present

## 2017-08-13 DIAGNOSIS — H43813 Vitreous degeneration, bilateral: Secondary | ICD-10-CM | POA: Diagnosis not present

## 2017-09-09 ENCOUNTER — Other Ambulatory Visit: Payer: Self-pay | Admitting: Family Medicine

## 2017-09-09 DIAGNOSIS — Z1231 Encounter for screening mammogram for malignant neoplasm of breast: Secondary | ICD-10-CM

## 2017-09-24 DIAGNOSIS — H2513 Age-related nuclear cataract, bilateral: Secondary | ICD-10-CM | POA: Diagnosis not present

## 2017-09-24 DIAGNOSIS — H43813 Vitreous degeneration, bilateral: Secondary | ICD-10-CM | POA: Diagnosis not present

## 2017-09-24 DIAGNOSIS — H401132 Primary open-angle glaucoma, bilateral, moderate stage: Secondary | ICD-10-CM | POA: Diagnosis not present

## 2017-10-03 ENCOUNTER — Ambulatory Visit
Admission: RE | Admit: 2017-10-03 | Discharge: 2017-10-03 | Disposition: A | Discharge status: Home / Self Care | Payer: Medicare HMO | Source: Ambulatory Visit | Attending: Family Medicine | Admitting: Family Medicine

## 2017-10-03 DIAGNOSIS — Z1231 Encounter for screening mammogram for malignant neoplasm of breast: Secondary | ICD-10-CM

## 2017-10-14 DIAGNOSIS — H401132 Primary open-angle glaucoma, bilateral, moderate stage: Secondary | ICD-10-CM | POA: Diagnosis not present

## 2017-10-14 DIAGNOSIS — H401122 Primary open-angle glaucoma, left eye, moderate stage: Secondary | ICD-10-CM | POA: Diagnosis not present

## 2017-10-14 DIAGNOSIS — H43813 Vitreous degeneration, bilateral: Secondary | ICD-10-CM | POA: Diagnosis not present

## 2017-10-14 DIAGNOSIS — H2512 Age-related nuclear cataract, left eye: Secondary | ICD-10-CM | POA: Diagnosis not present

## 2017-10-14 DIAGNOSIS — H2513 Age-related nuclear cataract, bilateral: Secondary | ICD-10-CM | POA: Diagnosis not present

## 2017-10-14 DIAGNOSIS — H25812 Combined forms of age-related cataract, left eye: Secondary | ICD-10-CM | POA: Diagnosis not present

## 2017-10-15 DIAGNOSIS — H401132 Primary open-angle glaucoma, bilateral, moderate stage: Secondary | ICD-10-CM | POA: Diagnosis not present

## 2017-10-15 DIAGNOSIS — H43813 Vitreous degeneration, bilateral: Secondary | ICD-10-CM | POA: Diagnosis not present

## 2017-10-15 DIAGNOSIS — H2513 Age-related nuclear cataract, bilateral: Secondary | ICD-10-CM | POA: Diagnosis not present

## 2017-10-29 DIAGNOSIS — H43813 Vitreous degeneration, bilateral: Secondary | ICD-10-CM | POA: Diagnosis not present

## 2017-10-29 DIAGNOSIS — H401132 Primary open-angle glaucoma, bilateral, moderate stage: Secondary | ICD-10-CM | POA: Diagnosis not present

## 2017-11-19 ENCOUNTER — Ambulatory Visit
Admission: RE | Admit: 2017-11-19 | Discharge: 2017-11-19 | Disposition: A | Discharge status: Home / Self Care | Payer: Medicare HMO | Source: Ambulatory Visit | Attending: Internal Medicine | Admitting: Internal Medicine

## 2017-11-19 DIAGNOSIS — R911 Solitary pulmonary nodule: Secondary | ICD-10-CM

## 2017-11-19 DIAGNOSIS — R918 Other nonspecific abnormal finding of lung field: Secondary | ICD-10-CM | POA: Diagnosis not present

## 2017-11-19 IMAGING — CT CT CHEST W/O CM
1 of 2 series · 15 of 32 positions shown, 19 images · non-contrast
Comparison: Chest CT [DATE] and [DATE]. Radiographs
[DATE].

CLINICAL DATA: Follow up pulmonary nodules. History of pneumonia
and bronchitis. No history of malignancy.

EXAM:
CT CHEST WITHOUT CONTRAST
TECHNIQUE: Multidetector CT imaging of the chest was performed following the
standard protocol without IV contrast.

[Series 2: chest w/(date) · axial · 0.65mm/px · z∈[-234,+16]mm · 15 of 137 slices shown, 19 images]
[im 6/137  mediastinal]
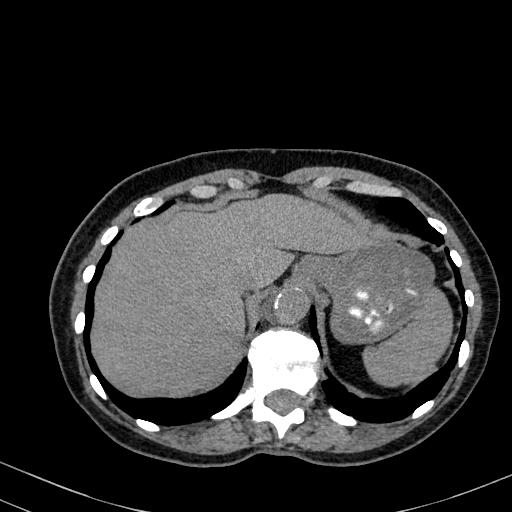
[im 6/137  lung]
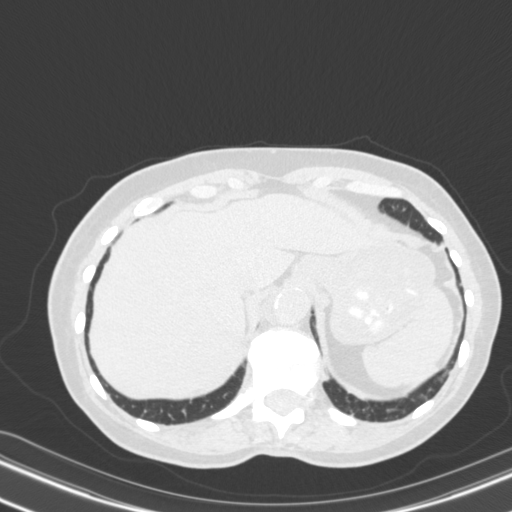
[im 18/137  lung]
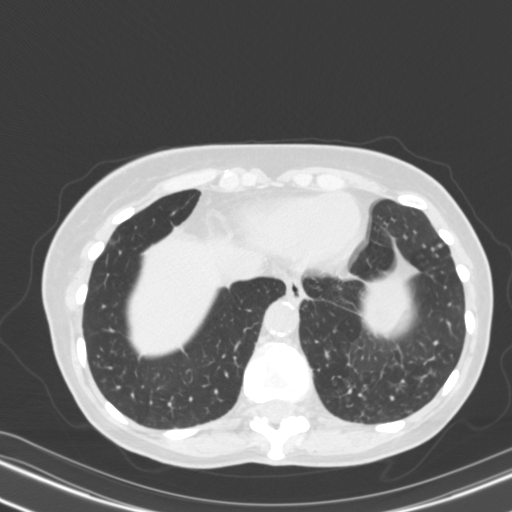
[im 30/137  lung]
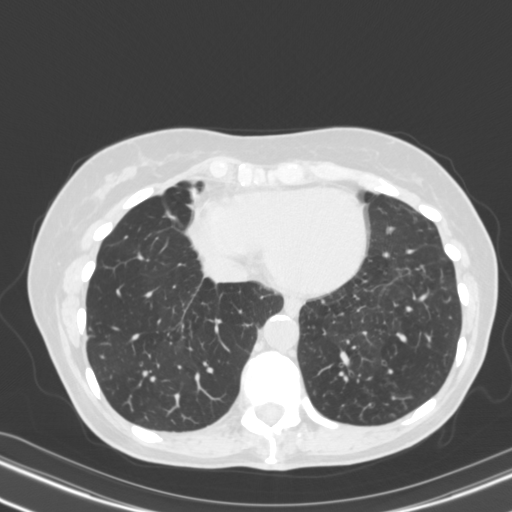
[im 36/137  lung]
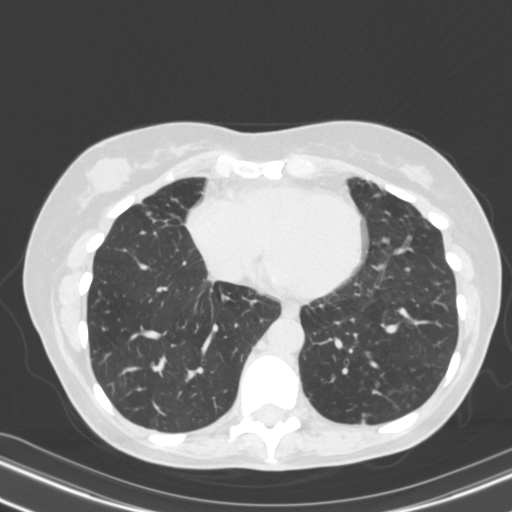
[im 42/137  mediastinal]
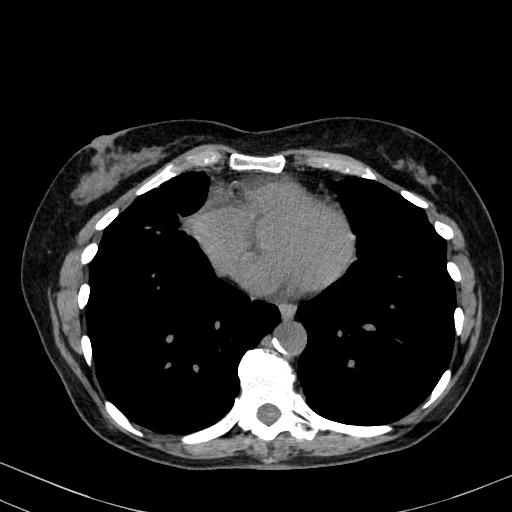
[im 42/137  lung]
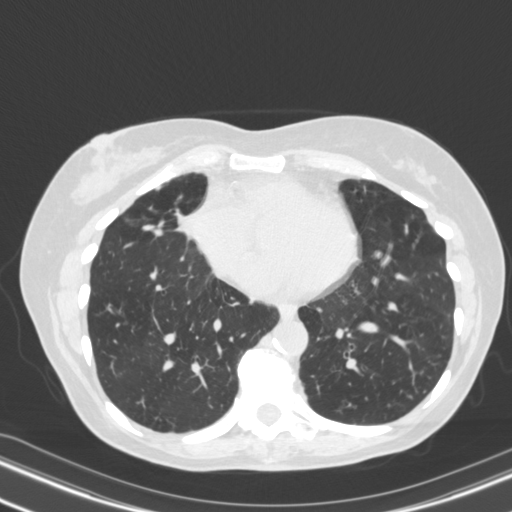
[im 54/137  lung]
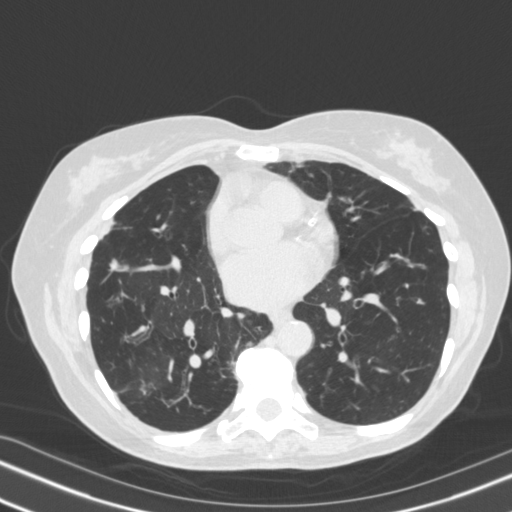
[im 60/137  lung]
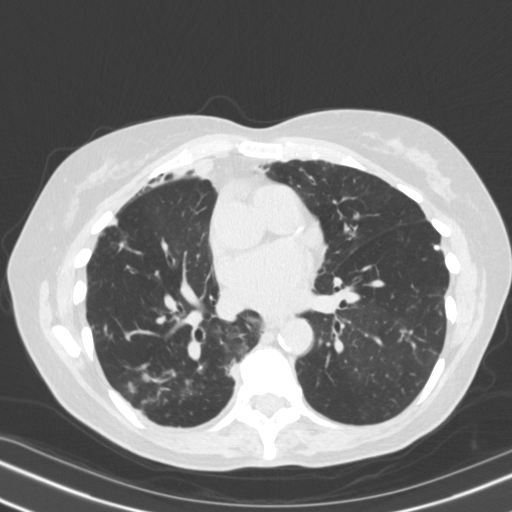
[im 69/137  lung]
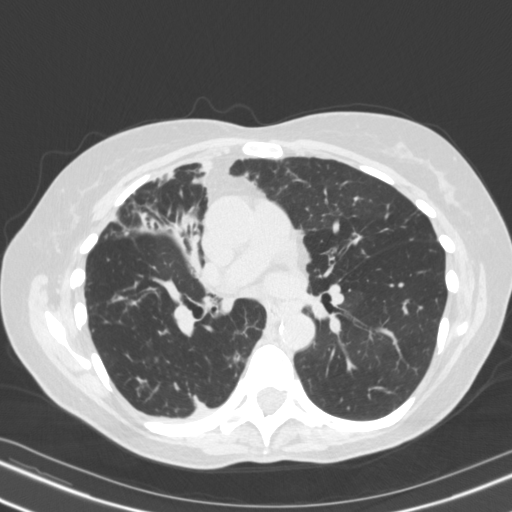
[im 71/137  mediastinal]
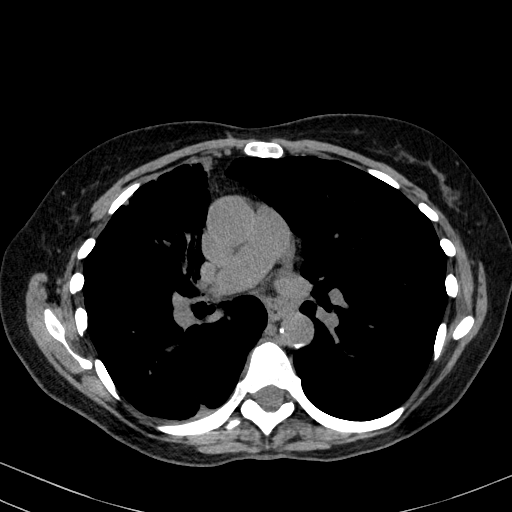
[im 71/137  lung]
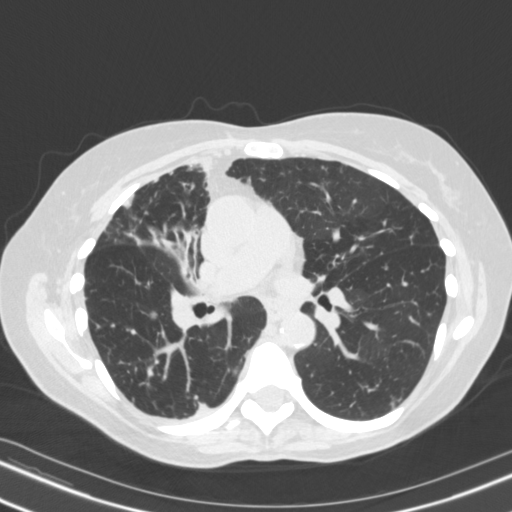
[im 83/137  lung]
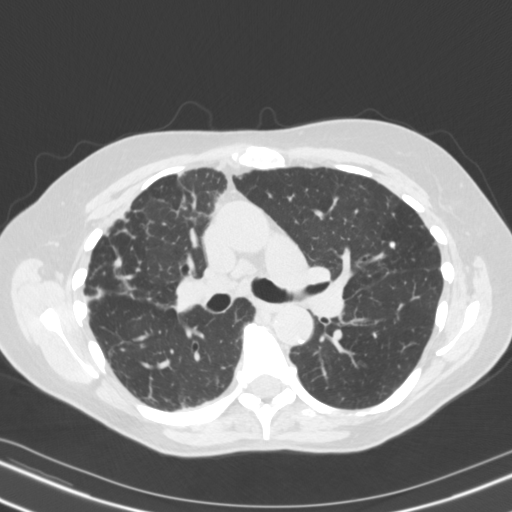
[im 95/137  lung]
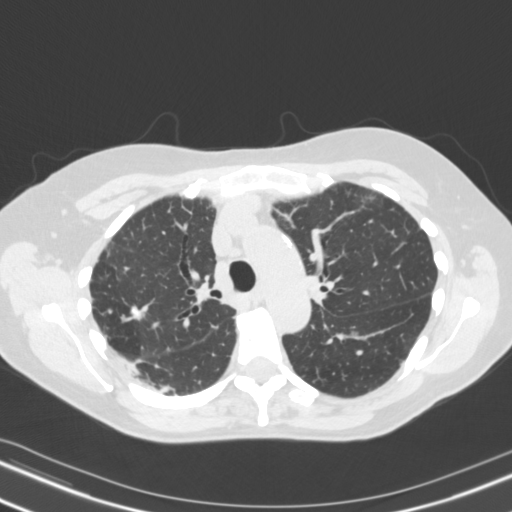
[im 101/137  lung]
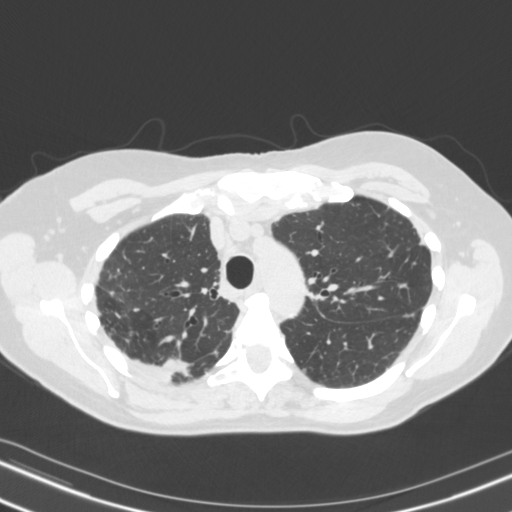
[im 107/137  mediastinal]
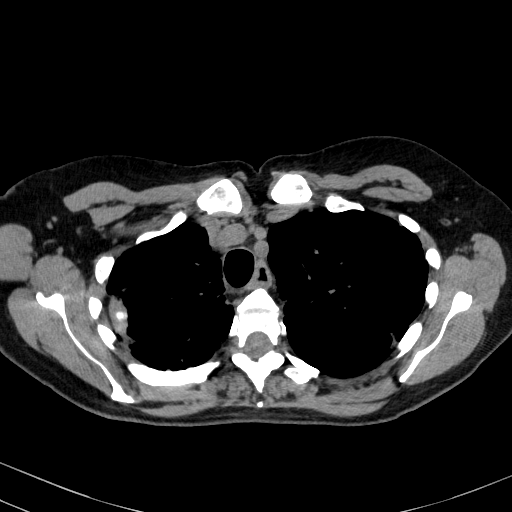
[im 107/137  lung]
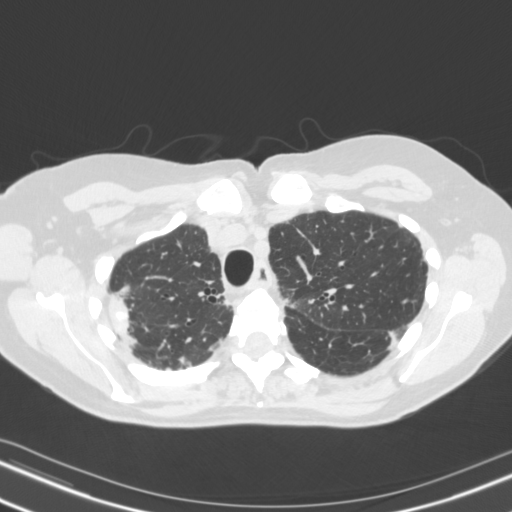
[im 119/137  lung]
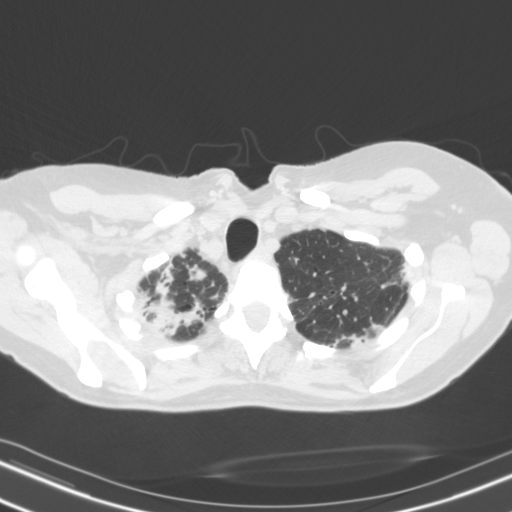
[im 131/137  lung]
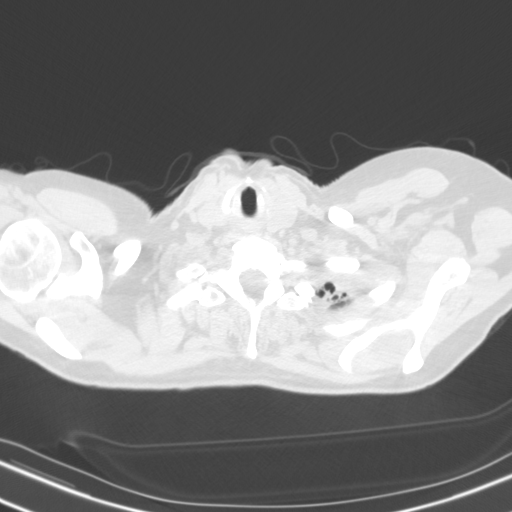

[15 of 32 positions shown; findings below may reference images not displayed]

FINDINGS: Cardiovascular: There is atherosclerosis of the aorta, great vessels
and coronary arteries. The heart size is normal. There is no
pericardial effusion.

Mediastinum/Nodes: There are no enlarged mediastinal, hilar or
axillary lymph nodes.Hilar assessment is limited by the lack of
intravenous contrast, although the hilar contours appear unchanged.
The thyroid gland, trachea and esophagus demonstrate no significant
findings.

Lungs/Pleura: There is no pleural effusion or pneumothorax. There is
stable extensive chronic lung disease with architectural distortion,
subpleural reticulation, biapical scarring and bronchiectasis.
Bronchiectasis and volume loss remain greatest in the right middle
lobe, similar to the previous study. Multiple pulmonary nodules are
unchanged, largest in the right lower lobe, measuring 13 x 10 mm on
image 60/4 and 19 x 17 mm on image 74/4. There are no new or
enlarging pulmonary nodules. There is no endobronchial lesion or
confluent airspace opacity.

Upper abdomen: The visualized upper abdomen appears stable without
suspicious findings.

Musculoskeletal/Chest wall: There is no chest wall mass or
suspicious osseous finding. There is prominent discogenic sclerosis
at T12-L1.
IMPRESSION: 1. Stable bilateral pulmonary nodularity, including the dominant
lesions in the right lower lobe, from comparison study nearly 6
months ago. Again, these findings are likely related to the
underlying chronic inflammatory/infectious process (likely
mycobacterium avium intracellular infection). Consider continued CT
follow-up in 6-12 months.
2. No new or enlarging nodules.
3. No acute inflammation or adenopathy.
4. Coronary and Aortic Atherosclerosis ([1K]-[1K]).

## 2017-11-20 DIAGNOSIS — J019 Acute sinusitis, unspecified: Secondary | ICD-10-CM | POA: Diagnosis not present

## 2017-11-26 ENCOUNTER — Encounter: Payer: Self-pay | Admitting: Internal Medicine

## 2017-11-26 ENCOUNTER — Ambulatory Visit: Payer: Medicare HMO | Admitting: Internal Medicine

## 2017-11-26 VITALS — BP 110/78 | HR 86 | Ht 67.5 in | Wt 142.0 lb

## 2017-11-26 DIAGNOSIS — J310 Chronic rhinitis: Secondary | ICD-10-CM

## 2017-11-26 DIAGNOSIS — J479 Bronchiectasis, uncomplicated: Secondary | ICD-10-CM

## 2017-11-26 DIAGNOSIS — R918 Other nonspecific abnormal finding of lung field: Secondary | ICD-10-CM

## 2017-11-26 DIAGNOSIS — J0101 Acute recurrent maxillary sinusitis: Secondary | ICD-10-CM

## 2017-11-26 MED ORDER — METHYLPREDNISOLONE ACETATE 80 MG/ML IJ SUSP
80.0000 mg | Freq: Once | INTRAMUSCULAR | Status: AC
  Administered 2017-11-26: 80 mg via INTRAMUSCULAR

## 2017-11-26 MED ORDER — AZELASTINE HCL 0.1 % NA SOLN: NASAL | 12 refills | Status: AC

## 2017-11-26 MED ORDER — PHENYLEPHRINE HCL 1 % NA SOLN
3.0000 [drp] | Freq: Once | NASAL | Status: AC
  Administered 2017-11-26: 3 [drp] via NASAL

## 2017-11-26 NOTE — Patient Instructions (Signed)
Order- neb neo nasal    Dx acute recurrent maxillary sinusitis             Depo 71  Order- schedule future CT chest, no contrast    Dx bilateral lung nodules  Suggest Sudafed from pharmacy counter  Script printed to try astelin nasal spray for watery drainage. - can combine with flonase

## 2017-11-26 NOTE — Progress Notes (Signed)
Patient ID: [name], female    DOB: [birth year], 77 y.o.   MRN: [id number]  HPI  F never smoker with hx bronchitis/bronchiectasis and chronic recurrent hemoptysis,  + MAIC.. 09/12/12.complicated by sinusitis, Nocardia,  glaucoma, hx sinusistis  Rx: 02/10/13- zith 500 mg TIW, EMB 1200 TIW, Rif 300 mg TIW. Ended by ID. Nocardia Rx'd Bactrim x 1 year Office spirometry 06/29/2014-moderate obstructive airways disease, FVC 2.17/69%, FEV1 1.39/60%, FEV1/FVC 64%, FEF 25-75 percent 0.75/4  ---------------------------------------------------------------------- 11/23/16- 77 year old female never smoker followed for chronic bronchitis/bronchiectasis/ MAIC, chronic recurrent hemoptysis  positive MAIC 27/25/3664 complicated by Nocardia, glaucoma, history sinusitis Rx: 02/10/13- zith 500 mg TIW, EMB 1200 TIW, Rif 300 mg TIW. Ended by ID. Nocardia Rx'd Bactrim x 1 year O2 2 L sleep/[facility]   dc'd  ONOX 03/05/16- No longer qualified Chronic bronchitis; Pt states she was sick in March but since then she feels great. Pt notes chest congestion at times and wonders if its related to her COPD.  Advair 250, albuterol HFA Had flu and pneumonia treated as an outpatient without CXR by PCP in March. Since then has felt well. Some dry cough most days. Dyspnea on exertion with hills and stairs. Infrequent use of rescue inhaler-not lately. She is followed twice a year by ophthalmology because of glaucoma and cataracts. Occasional nasal stuffiness especially in the mornings, watery rhinorrhea most days.  11/26/2017- 77 year old female never smoker followed for chronic bronchitis/bronchiectasis/ MAIC, chronic recurrent hemoptysis, Glaucoma, positive MAIC 40/34/7425 complicated by Nocardia, history sinusitis Rx: 02/10/13- zith 500 mg TIW, EMB 1200 TIW, Rif 300 mg TIW. Ended by ID. Nocardia Rx'd Bactrim x 1 year -----COPD; Pt states she is currently having wheezing and congestion from Sinus infection-on Omicef BID.  Advair  250, albuterol HFA, Flonase/ Dymista, , Has had 6 of 10 days of Omnicef from her PCP for sinusitis.  Feeling much better.  Mucus is clear.  Still has nasal congestion/sinus pressure sensation. Keeps some cough, usually nonproductive.  We discussed ongoing monitoring of her bronchiectasis/atypical respiratory infection.  She denies fevers or night sweats, purulent or bloody sputum. We reviewed recent CT. CT chest 11/19/17 IMPRESSION: 1. Stable bilateral pulmonary nodularity, including the dominant lesions in the right lower lobe, from comparison study nearly 6 months ago. 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 Consider continued CT follow-up in 6-12 months. 2. No new or enlarging nodules. 3. No acute inflammation or adenopathy. 4. Coronary and Aortic Atherosclerosis (ICD10-I70.0).  Review of Systems-see HPI   + = positive Constitutional:   No-   weight loss, night sweats, fevers, chills, fatigue, lassitude. HEENT:   No-  headaches, difficulty swallowing, tooth/dental problems, sore throat,       No- sneezing, no-itching, ear ache, +congestion, post nasal drip+,  CV:  No-   chest pain, orthopnea, PND, swelling in lower extremities, anasarca, dizziness, palpitations Resp: +  shortness of breath with exertion or at rest.             +productive cough,  + non-productive cough,  No- recent coughing up of blood.               change in color of mucus.  No- wheezing.   Skin: No-   rash or lesions. GI:  No-   heartburn, indigestion, abdominal pain, nausea, vomiting,  GU:  MS:  No-   joint pain or swelling.  . Neuro-     nothing unusual Psych:  No- change  in mood or affect. No depression or anxiety.  No memory loss.  Objective:   Physical Exam General- Alert, Oriented, Affect-cheerful, Distress- none acute. Trim. Looks well. Skin- rash-none, lesions- none, excoriation-  none Lymphadenopathy- none Head- atraumatic            Eyes- Gross vision intact, PERRLA, conjunctivae clear secretions            Ears- Hearing, canals-normal            Nose- + stuffy, no-Septal dev, mucus, polyps, erosion, perforation             Throat- Mallampati II , mucosa clear , drainage- none, tonsils-  atrophic Neck- flexible , trachea midline, no stridor , thyroid nl, carotid no bruit Chest - symmetrical excursion , unlabored           Heart/CV- RRR/ occ extra beat , no murmur , no gallop  , no rub, nl s1 s2                           - JVD- none , edema- none, stasis changes- none, varices- none           Lung- + Scattered crackle and squeak, unlabored, cough wheezy +, dullness-none, rub- none           Chest wall-  Abd-  Br/ Gen/ Rectal- Not done, not indicated Extrem- cyanosis- none, clubbing, none, atrophy- none, strength- nl.  Neuro- grossly intact to observation

## 2017-12-03 NOTE — Assessment & Plan Note (Signed)
These are probably related to her chronic bronchiectasis, representing mucous plugging. Plan-follow-up chest CT as planned.

## 2017-12-03 NOTE — Assessment & Plan Note (Signed)
We discussed trying to improve drainage and relieve sense of congestion. Plan-nasal nebulizer decongestant treatment, Depo-Medrol, Sudafed, prescription Astelin for use as directed

## 2017-12-03 NOTE — Assessment & Plan Note (Signed)
Clinically stable.  We discussed follow-up chest CT in 1 year as planned

## 2017-12-12 ENCOUNTER — Ambulatory Visit: Payer: Medicare HMO | Admitting: Podiatry

## 2017-12-12 DIAGNOSIS — B351 Tinea unguium: Secondary | ICD-10-CM

## 2017-12-12 DIAGNOSIS — L603 Nail dystrophy: Secondary | ICD-10-CM | POA: Diagnosis not present

## 2017-12-15 NOTE — Progress Notes (Signed)
Subjective:   Patient ID: [name], female   DOB: 77 y.o.   MRN: [id number]   HPI 77 year old female presents the office today for concerns of thick, discolored toenails that she cannot trim herself mostly to the right big toe, second toe of the left second toe.  She states that she previously went to a dermatologist in 2017 she was given a compound ointment that she used for 1 year which did not help.  She states that occasionally has become uncomfortable with pressure but denies any redness or drainage or any swelling.   Review of Systems  All other systems reviewed and are negative.  Past Medical History:  Diagnosis Date  . Allergy   . Anemia   . Cataract   . COPD (chronic obstructive pulmonary disease) ([place])   . Depression   . Essential hypertension 11/29/2014  . Glaucoma   . Hemoptysis   . Hypothyroidism   . Oxygen deficiency    patient was using oxygen at home, her pulmonologist discontinued it and patient stopped using it on Monday Mar 20, 2015  . Restless leg syndrome   . Sinusitis   . Vertigo     Past Surgical History:  Procedure Laterality Date  . ABDOMINAL HYSTERECTOMY    . APPENDECTOMY  2009  . BREAST CYST ASPIRATION Right 06/12/2016  . VESICOVAGINAL FISTULA CLOSURE W/ TAH  1992     Current Outpatient Medications:  .  albuterol (PROVENTIL HFA;VENTOLIN HFA) 108 (90 Base) MCG/ACT inhaler, Inhale 2 puffs into the lungs every 6 (six) hours as needed for wheezing or shortness of breath., Disp: 1 Inhaler, Rfl: 12 .  azelastine (ASTELIN) 0.1 % nasal spray, 1-2 puffs each nostril twice daily if needed, Disp: 30 mL, Rfl: 12 .  Azelastine-Fluticasone (DYMISTA) 137-50 MCG/ACT SUSP, Place 2 sprays into both nostrils at bedtime., Disp: 1 Bottle, Rfl: 0 .  AZOPT 1 % ophthalmic suspension, Place 1 drop into the left eye 2 (two) times daily., Disp: , Rfl:  .  benzonatate (TESSALON) 100 MG capsule, Take 100 mg by mouth 3 (three) times daily as needed for cough., Disp:  , Rfl:  .  cefdinir (OMNICEF) 300 MG capsule, Take 1 capsule by mouth 2 (two) times daily., Disp: , Rfl: 0 .  citalopram (CELEXA) 20 MG tablet, Take 20 mg by mouth daily.  , Disp: , Rfl:  .  Coenzyme Q10 (COQ-10) 100 MG CAPS, Take by mouth daily., Disp: , Rfl:  .  Cyanocobalamin (B-12 PO), Take by mouth daily., Disp: , Rfl:  .  estradiol (ESTRACE) 0.5 MG tablet, Take 0.5 mg by mouth daily.  , Disp: , Rfl:  .  ferrous gluconate (FERGON) 246 (28 FE) MG tablet, Take 246 mg by mouth daily with breakfast.  , Disp: , Rfl:  .  fluticasone (FLONASE) 50 MCG/ACT nasal spray, Place 2 sprays into both nostrils daily. (Patient taking differently: Place 2 sprays into both nostrils daily as needed. ), Disp: 15 g, Rfl: 2 .  Fluticasone-Salmeterol (ADVAIR) 250-50 MCG/DOSE AEPB, Inhale 1 puff then rinse mouth, twice daily- maintenance, Disp: 60 each, Rfl: 11 .  latanoprost (XALATAN) 0.005 % ophthalmic solution, Place 1 drop into both eyes at bedtime., Disp: , Rfl:  .  levothyroxine (SYNTHROID, LEVOTHROID) 88 MCG tablet, Take 88 mcg by mouth daily.  , Disp: , Rfl:  .  LORATADINE PO, Take by mouth daily., Disp: , Rfl:  .  meclizine (ANTIVERT) 25 MG tablet, Take 25 mg by mouth daily as needed  for dizziness or nausea. , Disp: , Rfl:  .  Multiple Minerals (CALCIUM/MAGNESIUM/ZINC) TABS, Take 3 tablets by mouth at bedtime. 1800mg , Disp: , Rfl:  .  Multiple Vitamin (MULTIVITAMIN) tablet, Take 1 tablet by mouth daily.  , Disp: , Rfl:  .  Naproxen Sodium (ALEVE) 220 MG CAPS, Take 440 mg by mouth daily. As needed for pain, Disp: , Rfl:  .  Red Yeast Rice Extract (RED YEAST RICE PO), Take 1 capsule by mouth daily., Disp: , Rfl:  .  terbinafine (LAMISIL) 1 % cream, Apply 1 application topically 2 (two) times daily., Disp: , Rfl:  .  vitamin C (ASCORBIC ACID) 500 MG tablet, Take 500 mg by mouth daily.  , Disp: , Rfl:  .  vitamin E 400 UNIT capsule, Take 400 Units by mouth daily., Disp: , Rfl:   Current Facility-Administered  Medications:  .  0.9 %  sodium chloride infusion, 500 mL, Intravenous, Continuous, Danis, Kirke Corin, MD  Allergies  Allergen Reactions  . Augmentin [Amoxicillin-Pot Clavulanate] Nausea And Vomiting    .Marland KitchenHas patient had a PCN reaction causing immediate rash, facial/tongue/throat swelling, SOB or lightheadedness with hypotension: No Has patient had a PCN reaction causing severe rash involving mucus membranes or skin necrosis: No Has patient had a PCN reaction that required hospitalization No Has patient had a PCN reaction occurring within the last 10 years: No If all of the above answers are "NO", then may proceed with Cephalosporin use.   Renard Hamper Cohosh Nausea And Vomiting    Severe GI Upset, sweats  . Codeine Nausea And Vomiting    Severe GI upset, sweats  . Hyoscyamine Other (See Comments)    Cramps, made urinary symptoms worse  . Oxybutynin Chloride Other (See Comments)    Cramps, made urinary symptoms worse  . Minocycline Other (See Comments)    Scratchy tongue, swollen lips         Objective:  Physical Exam  General: AAO x3, NAD  Dermatological: Right first, second, left second toenail hypertrophic, dystrophic with ill-defined discoloration.  The other toenails have mild discoloration as well.  No edema, erythema or any clinical signs of infection noted today.  Vascular: Dorsalis Pedis artery and Posterior Tibial artery pedal pulses are 2/4 bilateral with immedate capillary fill time. There is no pain with calf compression, swelling, warmth, erythema.   Neruologic: Grossly intact via light touch bilateral.Protective threshold with Semmes Wienstein monofilament intact to all pedal sites bilateral.   Musculoskeletal: No gross boney pedal deformities bilateral. No pain, crepitus, or limitation noted with foot and ankle range of motion bilateral. Muscular strength 5/5 in all groups tested bilateral.  Gait: Unassisted, Nonantalgic.       Assessment:   Likely  onychomycosis    Plan:  -Treatment options discussed including all alternatives, risks, and complications -Etiology of symptoms were discussed -Since she can use a topical without any significant improvement I debrided the nails as of the blood culture, pathology to [facility] labs.  Discussed various treatment options but will await the results of the biopsy before proceeding with definitive treatment.  I will contact her once the results back.  Trula Slade DPM

## 2017-12-19 DIAGNOSIS — H43813 Vitreous degeneration, bilateral: Secondary | ICD-10-CM | POA: Diagnosis not present

## 2017-12-19 DIAGNOSIS — H401132 Primary open-angle glaucoma, bilateral, moderate stage: Secondary | ICD-10-CM | POA: Diagnosis not present

## 2017-12-20 DIAGNOSIS — J069 Acute upper respiratory infection, unspecified: Secondary | ICD-10-CM | POA: Diagnosis not present

## 2017-12-23 ENCOUNTER — Encounter: Payer: Self-pay | Admitting: Podiatry

## 2017-12-27 ENCOUNTER — Telehealth: Payer: Self-pay | Admitting: Podiatry

## 2017-12-27 DIAGNOSIS — Z79899 Other long term (current) drug therapy: Secondary | ICD-10-CM

## 2017-12-27 NOTE — Telephone Encounter (Signed)
Patient called to get test results, still has not heard anything.

## 2017-12-27 NOTE — Telephone Encounter (Signed)
Please let her know that the culture did show fungus. Since she has done topical, the next option would be to do itraconazole orally for her type of fungus. Will need to check a CBC and LFT prior if she wanted to do this.

## 2017-12-27 NOTE — Telephone Encounter (Signed)
[name] states the results are available and will fax to [phone].

## 2017-12-30 DIAGNOSIS — Z79899 Other long term (current) drug therapy: Secondary | ICD-10-CM | POA: Diagnosis not present

## 2017-12-30 NOTE — Telephone Encounter (Signed)
I informed pt of Dr. Leigh Aurora review of results and recommendation. Pt states she would like to begin the medication. I informed pt, she would need labs, and once reviewed if normal we would order the itraconazole. I informed pt of [facility] hours and location.

## 2017-12-30 NOTE — Addendum Note (Signed)
Addended by: Harriett Sine D on: 12/30/2017 09:08 AM   Modules accepted: Orders

## 2017-12-31 LAB — HEPATIC FUNCTION PANEL
AG Ratio: 1.5 (calc) (ref 1.0–2.5)
ALKALINE PHOSPHATASE (APISO): 99 U/L (ref 33–130)
ALT: 18 U/L (ref 6–29)
AST: 22 U/L (ref 10–35)
Albumin: 4.1 g/dL (ref 3.6–5.1)
BILIRUBIN DIRECT: 0.1 mg/dL (ref 0.0–0.2)
BILIRUBIN INDIRECT: 0.3 mg/dL (ref 0.2–1.2)
BILIRUBIN TOTAL: 0.4 mg/dL (ref 0.2–1.2)
Globulin: 2.8 g/dL (calc) (ref 1.9–3.7)
Total Protein: 6.9 g/dL (ref 6.1–8.1)

## 2017-12-31 LAB — CBC WITH DIFFERENTIAL/PLATELET
BASOS PCT: 1.1 %
Basophils Absolute: 63 cells/uL (ref 0–200)
EOS PCT: 3 %
Eosinophils Absolute: 171 cells/uL (ref 15–500)
HEMATOCRIT: 43.7 % (ref 35.0–45.0)
HEMOGLOBIN: 14.5 g/dL (ref 11.7–15.5)
LYMPHS ABS: 1237 {cells}/uL (ref 850–3900)
MCH: 31.6 pg (ref 27.0–33.0)
MCHC: 33.2 g/dL (ref 32.0–36.0)
MCV: 95.2 fL (ref 80.0–100.0)
MONOS PCT: 11.4 %
MPV: 10.9 fL (ref 7.5–12.5)
NEUTROS ABS: 3580 {cells}/uL (ref 1500–7800)
Neutrophils Relative %: 62.8 %
Platelets: 200 10*3/uL (ref 140–400)
RBC: 4.59 10*6/uL (ref 3.80–5.10)
RDW: 13 % (ref 11.0–15.0)
Total Lymphocyte: 21.7 %
WBC mixed population: 650 cells/uL (ref 200–950)
WBC: 5.7 10*3/uL (ref 3.8–10.8)

## 2018-01-01 ENCOUNTER — Ambulatory Visit
Admission: RE | Admit: 2018-01-01 | Discharge: 2018-01-01 | Disposition: A | Discharge status: Home / Self Care | Payer: Medicare HMO | Source: Ambulatory Visit | Attending: Family Medicine | Admitting: Family Medicine

## 2018-01-01 ENCOUNTER — Other Ambulatory Visit: Payer: Self-pay | Admitting: Family Medicine

## 2018-01-01 DIAGNOSIS — R05 Cough: Secondary | ICD-10-CM | POA: Diagnosis not present

## 2018-01-01 DIAGNOSIS — S0990XA Unspecified injury of head, initial encounter: Secondary | ICD-10-CM

## 2018-01-01 DIAGNOSIS — S0592XA Unspecified injury of left eye and orbit, initial encounter: Secondary | ICD-10-CM

## 2018-01-01 DIAGNOSIS — J019 Acute sinusitis, unspecified: Secondary | ICD-10-CM | POA: Diagnosis not present

## 2018-01-01 DIAGNOSIS — S022XXA Fracture of nasal bones, initial encounter for closed fracture: Secondary | ICD-10-CM | POA: Diagnosis not present

## 2018-01-01 IMAGING — CT CT MAXILLOFACIAL W/O CM
3 of 6 series · 16 of 47 positions shown, 19 images · non-contrast
Comparison: CT scan of [DATE] and [DATE].

CLINICAL DATA: Facial injury after fall.

EXAM:
CT HEAD WITHOUT CONTRAST
CT MAXILLOFACIAL WITHOUT CONTRAST
TECHNIQUE: Multidetector CT imaging of the head and maxillofacial structures
were performed using the standard protocol without intravenous
contrast. Multiplanar CT image reconstructions of the maxillofacial
structures were also generated.

[Series 2: maxillofacial 2.00 hr60 s3 · axial · 0.34mm/px · z∈[-714,-564]mm · 11 of 89 slices shown, 14 images]
[im 7/89  brain]
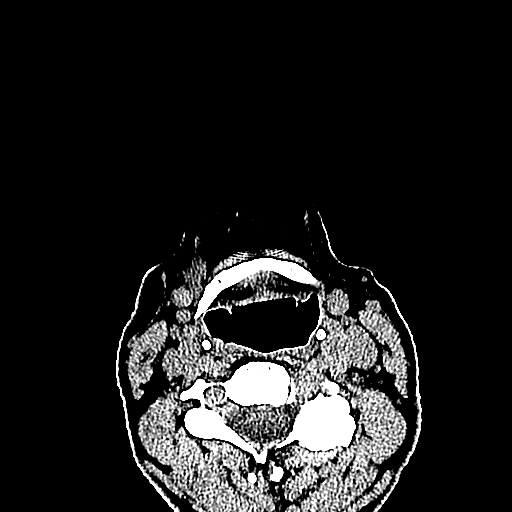
[im 7/89  bone]
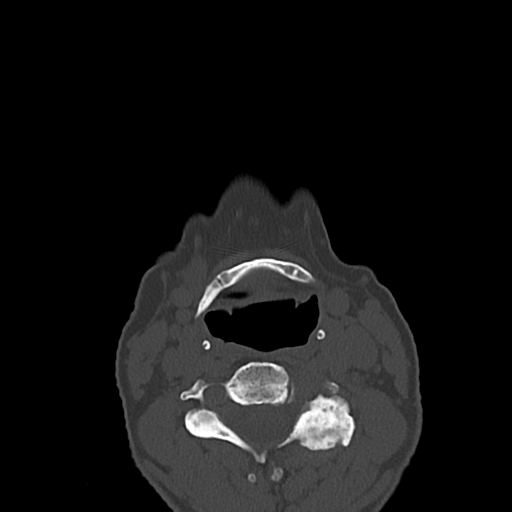
[im 13/89  bone]
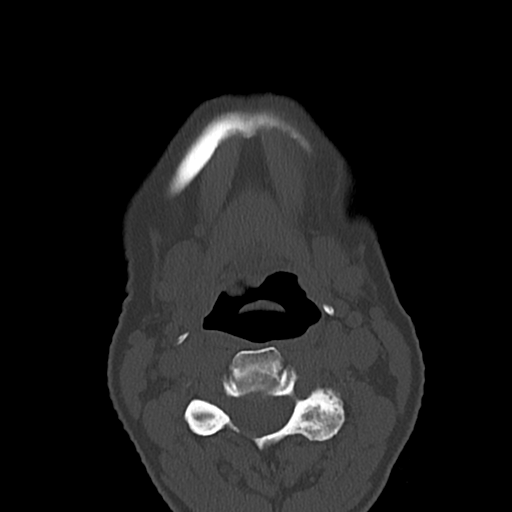
[im 19/89  bone]
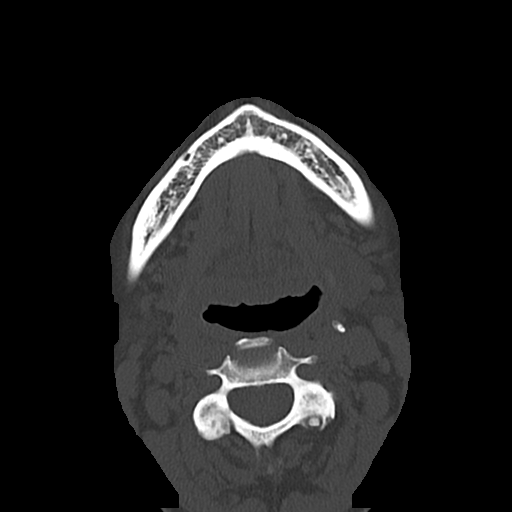
[im 32/89  bone]
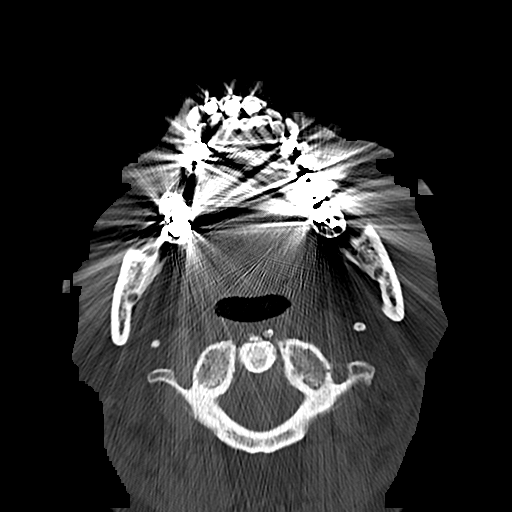
[im 38/89  brain]
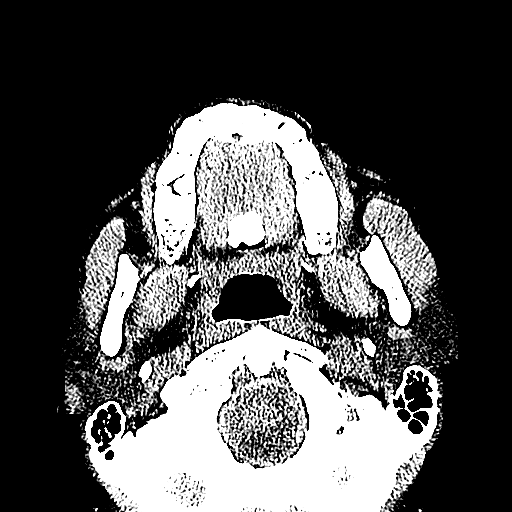
[im 38/89  bone]
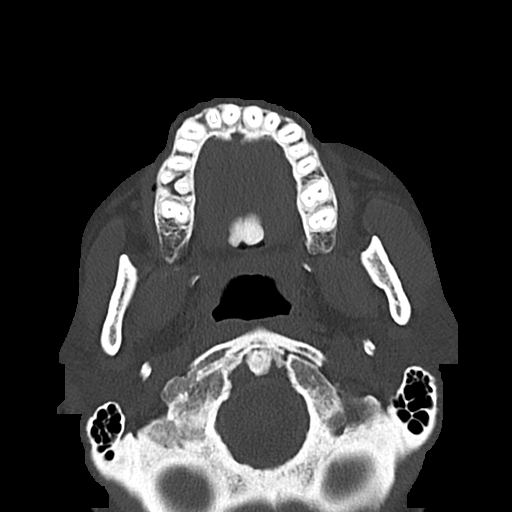
[im 45/89  bone]
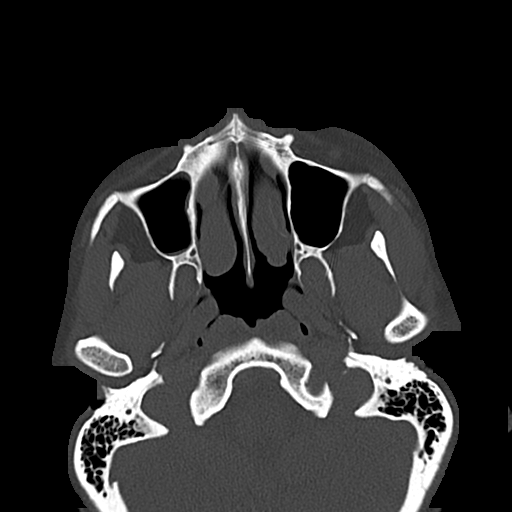
[im 51/89  bone]
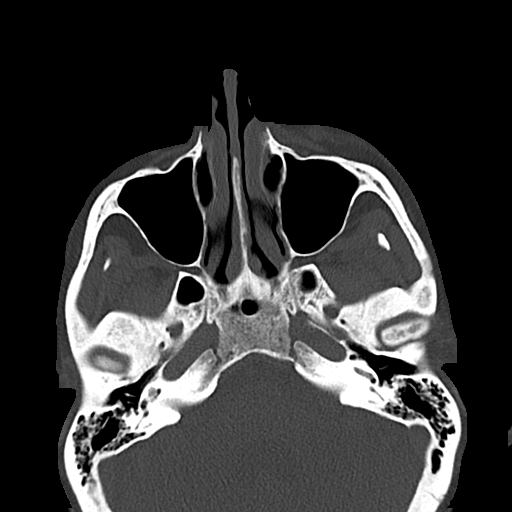
[im 57/89  bone]
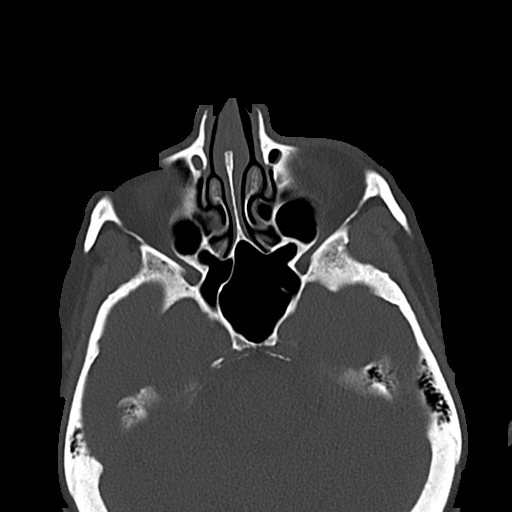
[im 70/89  brain]
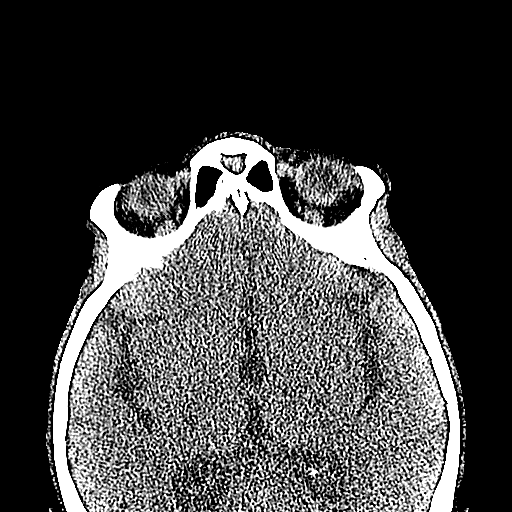
[im 70/89  bone]
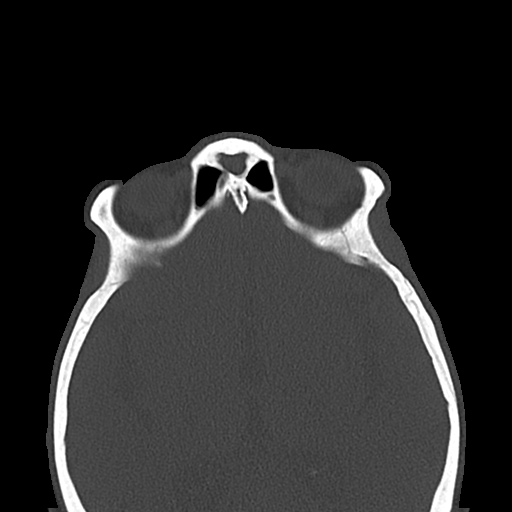
[im 76/89  bone]
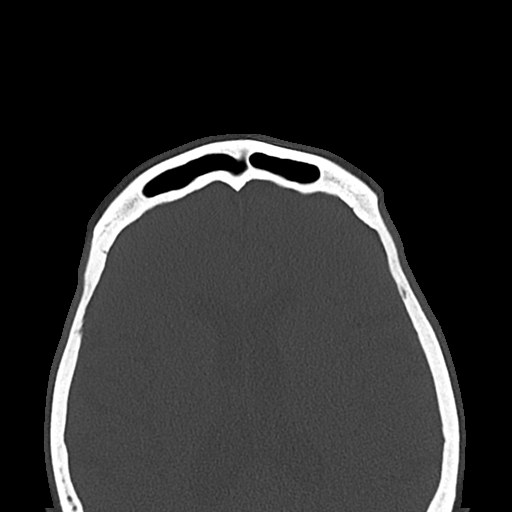
[im 82/89  bone]
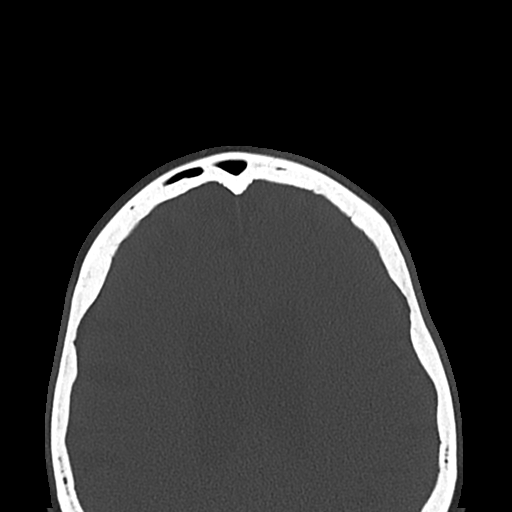

[Series 6: maxillofacial 2.00 hr60 s3 cor · coronal · 0.34mm/px · 3 of 86 slices shown]
[im 22/86  bone]
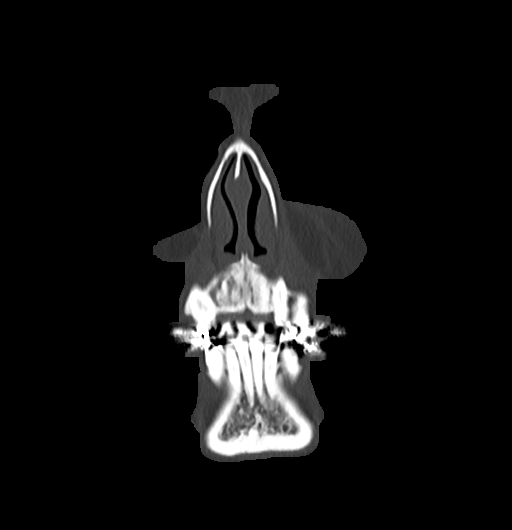
[im 43/86  bone]
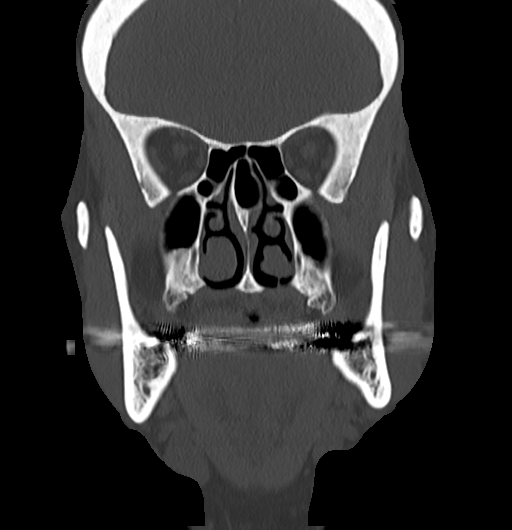
[im 64/86  bone]
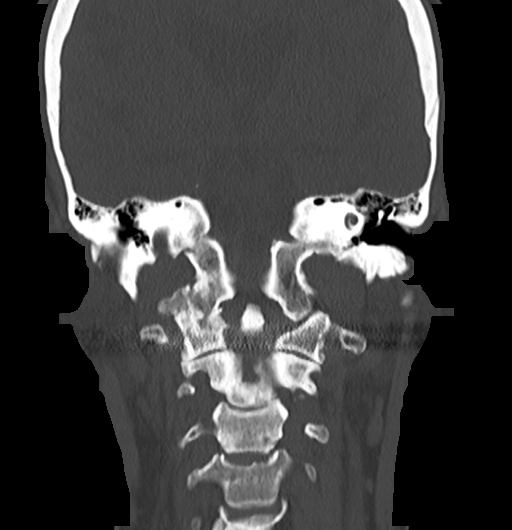

[Series 12: maxillofacial 2.00 hr40 s3 sag · sagittal · 0.34mm/px · 2 of 86 slices shown]
[im 29/86  bone]
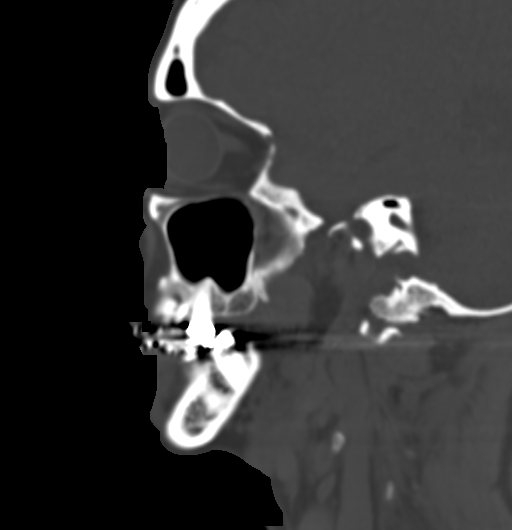
[im 57/86  bone]
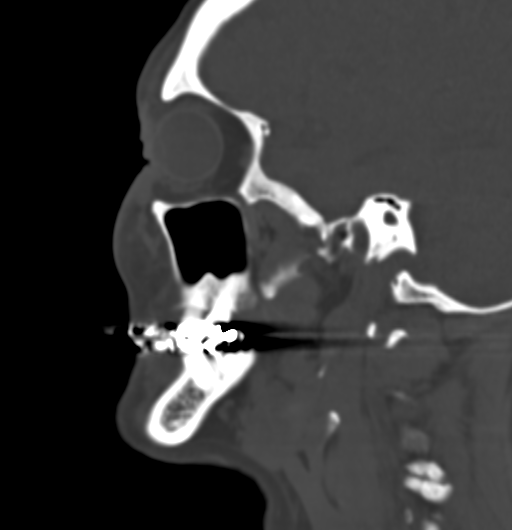

[16 of 47 positions shown; findings below may reference images not displayed]

FINDINGS: CT HEAD FINDINGS

Brain: Mild diffuse cortical atrophy is noted. No mass effect or
midline shift is noted. Ventricular size is within normal limits.
There is no evidence of mass lesion, hemorrhage or acute infarction.

Vascular: No hyperdense vessel or unexpected calcification.

Skull: Normal. Negative for fracture or focal lesion.

Other: None.

CT MAXILLOFACIAL FINDINGS

Osseous: No mandibular dislocation. Minimally displaced nasal bone
fracture is noted. No destructive process.

Orbits: Negative. No traumatic or inflammatory finding.

Sinuses: Clear.

Soft tissues: Soft tissue hematoma is seen overlying left
infraorbital rim.
IMPRESSION: Mild diffuse cortical atrophy. No acute intracranial abnormality
seen.

Soft tissue hematoma or contusion seen overlying left infraorbital
rim. Probable minimally displaced nasal bone fracture.

## 2018-01-01 IMAGING — CT CT HEAD W/O CM
4 series · 16 of 47 positions shown, 18 images · non-contrast
Comparison: CT scan of [DATE] and [DATE].

CLINICAL DATA: Facial injury after fall.

EXAM:
CT HEAD WITHOUT CONTRAST
CT MAXILLOFACIAL WITHOUT CONTRAST
TECHNIQUE: Multidetector CT imaging of the head and maxillofacial structures
were performed using the standard protocol without intravenous
contrast. Multiplanar CT image reconstructions of the maxillofacial
structures were also generated.

[Series 2: head 5.00 hr40 s3 ibhc · axial · 0.38mm/px · z∈[-640,-520]mm · 7 of 33 slices shown, 9 images]
[im 5/33  brain]
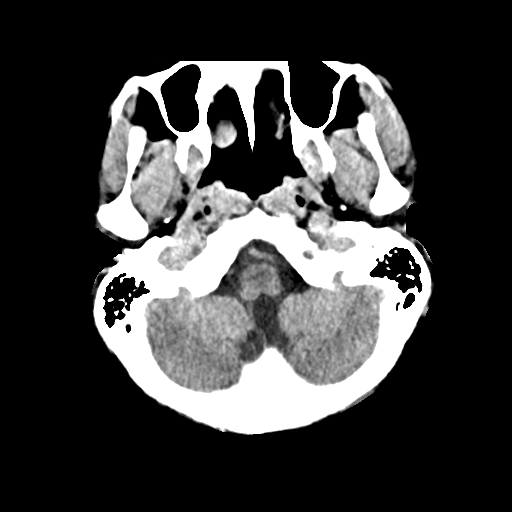
[im 5/33  bone]
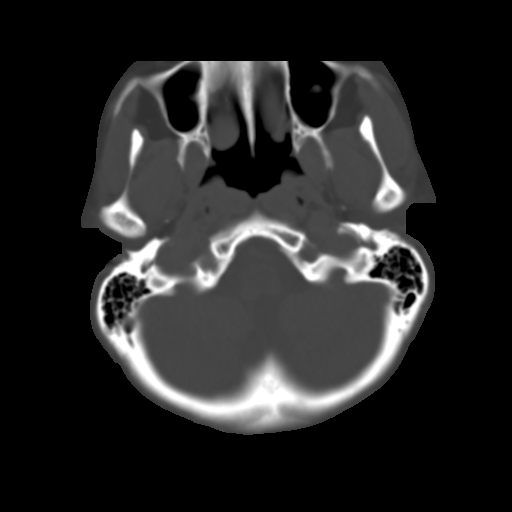
[im 9/33  brain]
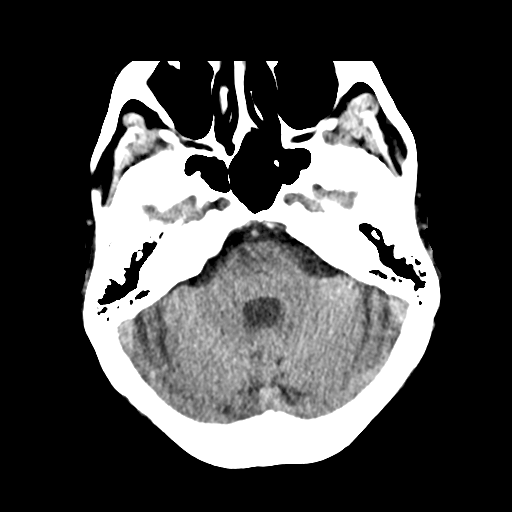
[im 13/33  brain]
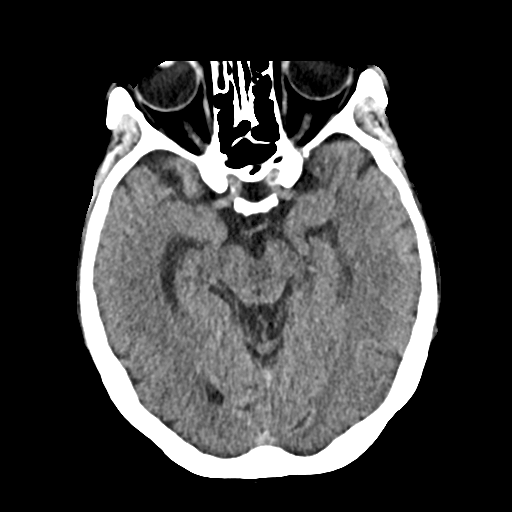
[im 17/33  brain]
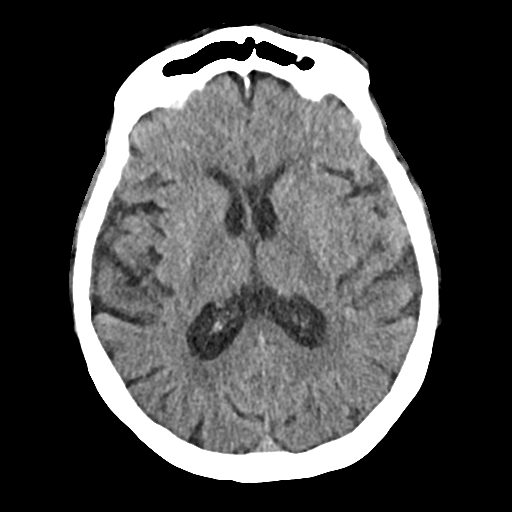
[im 21/33  brain]
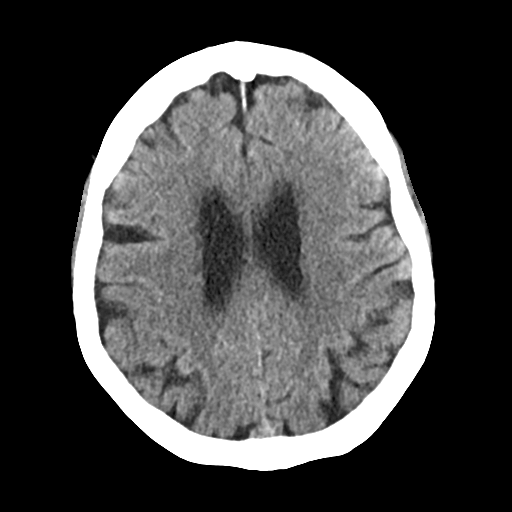
[im 21/33  bone]
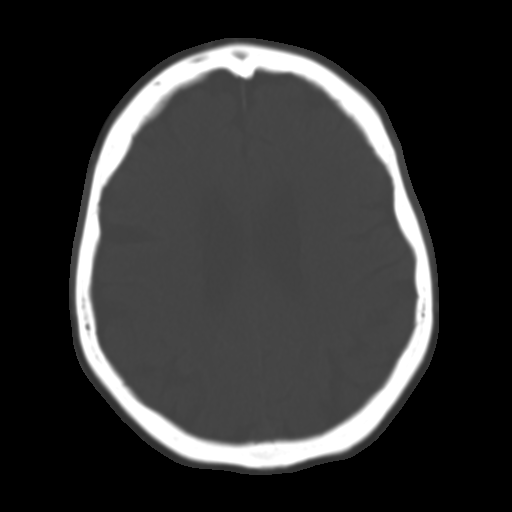
[im 25/33  brain]
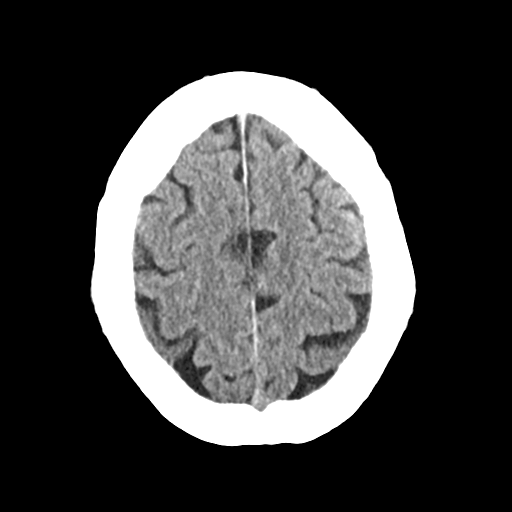
[im 29/33  brain]
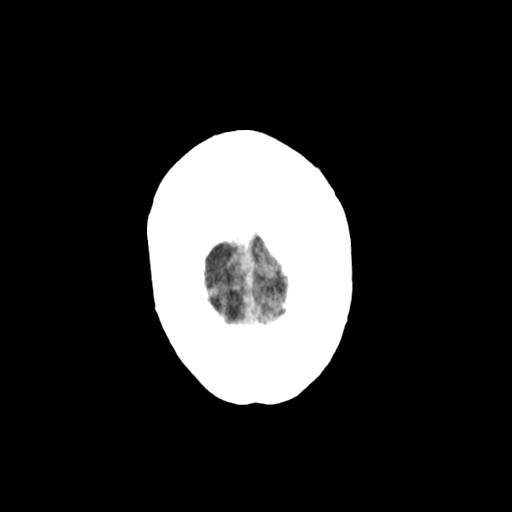

[Series 3: head 2.00 hr60 s3 bone · axial · 0.38mm/px · z∈[-646,-614]mm · 3 of 82 slices shown]
[im 9/82  bone]
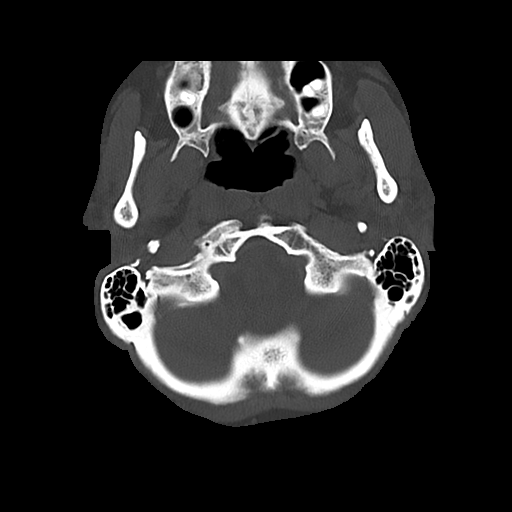
[im 17/82  bone]
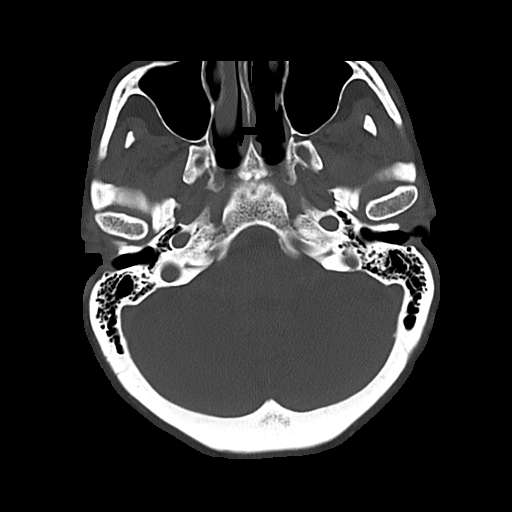
[im 25/82  bone]
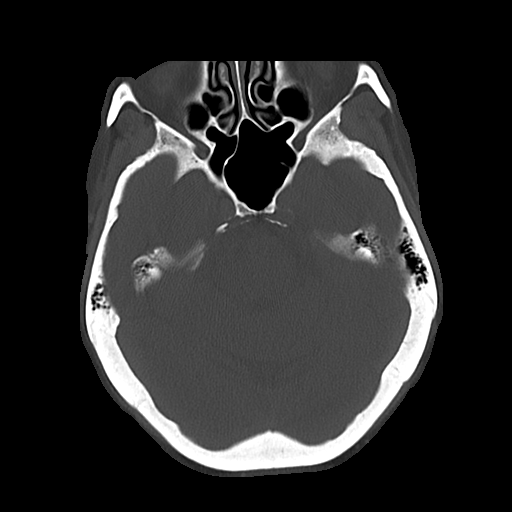

[Series 4: head 3.00 hr40 s3 sag · sagittal · 0.31mm/px · 3 of 56 slices shown]
[im 19/56  brain]
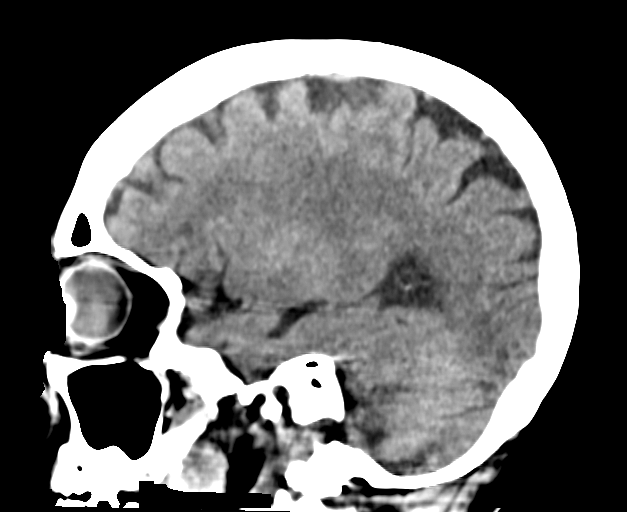
[im 28/56  brain]
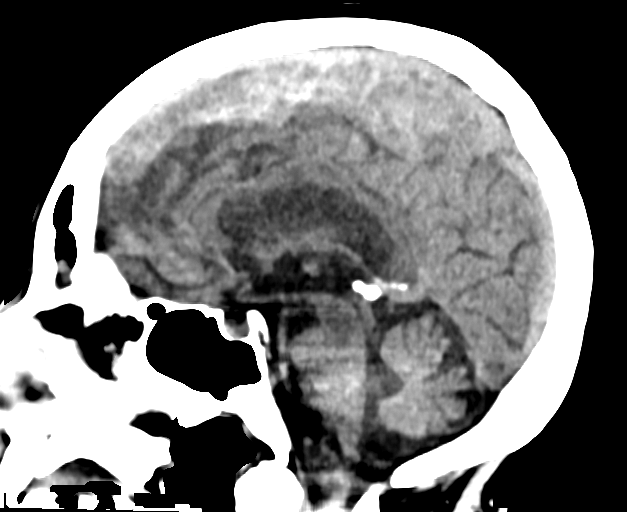
[im 37/56  brain]
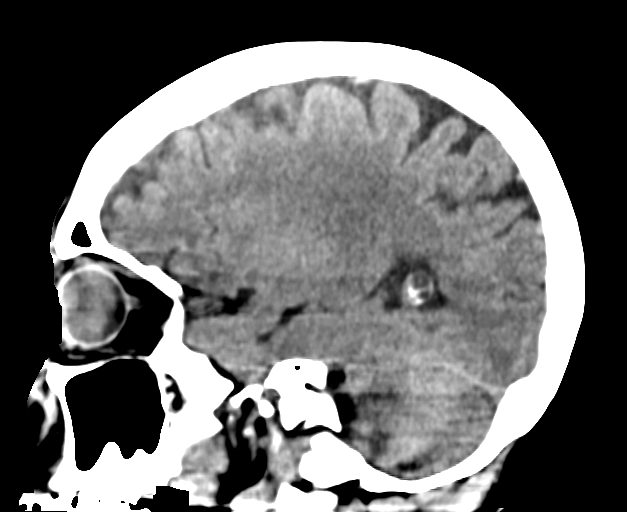

[Series 6: head 3.00 hr40 s3 cor · coronal · 0.31mm/px · 3 of 63 slices shown]
[im 21/63  brain]
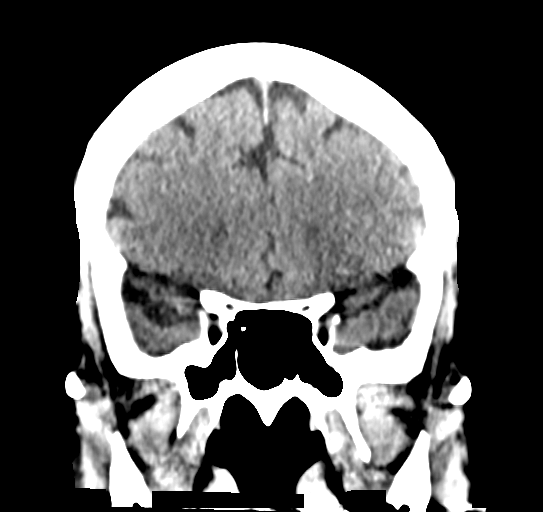
[im 28/63  brain]
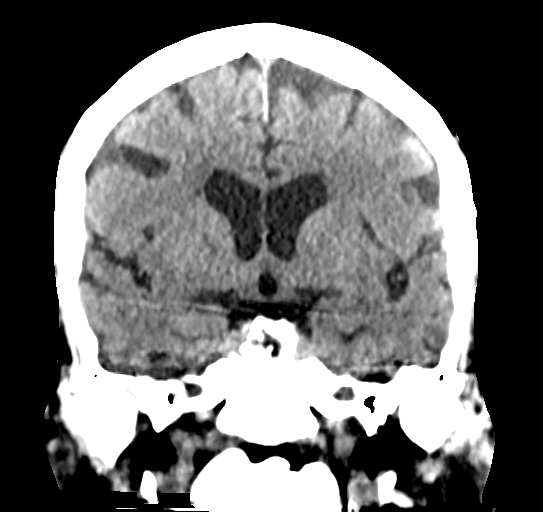
[im 35/63  brain]
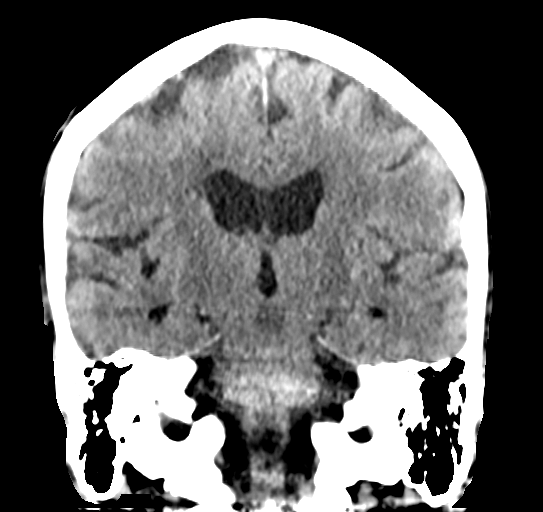

[16 of 47 positions shown; findings below may reference images not displayed]

FINDINGS: CT HEAD FINDINGS

Brain: Mild diffuse cortical atrophy is noted. No mass effect or
midline shift is noted. Ventricular size is within normal limits.
There is no evidence of mass lesion, hemorrhage or acute infarction.

Vascular: No hyperdense vessel or unexpected calcification.

Skull: Normal. Negative for fracture or focal lesion.

Other: None.

CT MAXILLOFACIAL FINDINGS

Osseous: No mandibular dislocation. Minimally displaced nasal bone
fracture is noted. No destructive process.

Orbits: Negative. No traumatic or inflammatory finding.

Sinuses: Clear.

Soft tissues: Soft tissue hematoma is seen overlying left
infraorbital rim.
IMPRESSION: Mild diffuse cortical atrophy. No acute intracranial abnormality
seen.

Soft tissue hematoma or contusion seen overlying left infraorbital
rim. Probable minimally displaced nasal bone fracture.

## 2018-01-02 DIAGNOSIS — H43813 Vitreous degeneration, bilateral: Secondary | ICD-10-CM | POA: Diagnosis not present

## 2018-01-02 DIAGNOSIS — H401132 Primary open-angle glaucoma, bilateral, moderate stage: Secondary | ICD-10-CM | POA: Diagnosis not present

## 2018-01-03 ENCOUNTER — Telehealth: Payer: Self-pay | Admitting: *Deleted

## 2018-01-03 NOTE — Telephone Encounter (Signed)
-----   Message from Trula Slade, DPM sent at 01/02/2018  8:42 AM EST ----- Val- Please order fluconazole 300mg  to be taken once a week for 3 months but have her follow-up in 6 weeks. Thanks.

## 2018-01-03 NOTE — Telephone Encounter (Signed)
Left message to call to discuss labs and medications.

## 2018-01-07 NOTE — Telephone Encounter (Signed)
-----   Message from Trula Slade, DPM sent at 01/02/2018  8:42 AM EST ----- Val- Please order fluconazole 300mg  to be taken once a week for 3 months but have her follow-up in 6 weeks. Thanks.

## 2018-01-07 NOTE — Telephone Encounter (Signed)
Left message requesting a call back to discuss lab results and medication instructions.

## 2018-01-21 DIAGNOSIS — H43813 Vitreous degeneration, bilateral: Secondary | ICD-10-CM | POA: Diagnosis not present

## 2018-01-21 DIAGNOSIS — H401132 Primary open-angle glaucoma, bilateral, moderate stage: Secondary | ICD-10-CM | POA: Diagnosis not present

## 2018-01-23 ENCOUNTER — Telehealth: Payer: Self-pay | Admitting: *Deleted

## 2018-01-23 NOTE — Telephone Encounter (Signed)
Unable to contact pt new phone number is busy.

## 2018-01-23 NOTE — Telephone Encounter (Deleted)
-----   Message from Trula Slade, DPM sent at 01/02/2018  8:42 AM EST ----- Val- Please order fluconazole 300mg  to be taken once a week for 3 months but have her follow-up in 6 weeks. Thanks.

## 2018-01-23 NOTE — Telephone Encounter (Signed)
-----   Message from Trula Slade, DPM sent at 01/02/2018  8:42 AM EST ----- Val- Please order fluconazole 300mg  to be taken once a week for 3 months but have her follow-up in 6 weeks. Thanks.

## 2018-01-23 NOTE — Telephone Encounter (Signed)
Pt called states she had gotten my November message for results and just had not called. Pt request call to new number [phone].

## 2018-01-24 DIAGNOSIS — J471 Bronchiectasis with (acute) exacerbation: Secondary | ICD-10-CM | POA: Diagnosis not present

## 2018-01-24 DIAGNOSIS — I10 Essential (primary) hypertension: Secondary | ICD-10-CM | POA: Diagnosis not present

## 2018-01-28 NOTE — Telephone Encounter (Signed)
Unable to leave a message phone line busy.

## 2018-01-31 ENCOUNTER — Encounter: Payer: Self-pay | Admitting: *Deleted

## 2018-01-31 NOTE — Telephone Encounter (Signed)
-----   Message from Trula Slade, DPM sent at 01/02/2018  8:42 AM EST ----- Val- Please order fluconazole 300mg  to be taken once a week for 3 months but have her follow-up in 6 weeks. Thanks.

## 2018-01-31 NOTE — Telephone Encounter (Signed)
Unable to contact pt, phone line is busy. Mailed review of results and orders to pt.

## 2018-02-10 ENCOUNTER — Telehealth: Payer: Self-pay | Admitting: Podiatry

## 2018-02-10 MED ORDER — FLUCONAZOLE 150 MG PO TABS: ORAL_TABLET | ORAL | 0 refills | Status: DC

## 2018-02-10 NOTE — Telephone Encounter (Signed)
Pt was seen 10/24 for toenail fungus and never received the prescription from the Dr. Abbott Pao calling to see if the doctor can resubmit the prescription to [facility] on [address]. Please give pt a call.

## 2018-02-10 NOTE — Addendum Note (Signed)
Addended by: Harriett Sine D on: 02/10/2018 11:39 AM   Modules accepted: Orders

## 2018-02-10 NOTE — Telephone Encounter (Signed)
Left message on pt's mobile, that I had tried multiple times to contact her and 01/31/2018 had mailed a letter with explanation of Dr. Leigh Aurora orders and to call our office with her decision. Unable to leave a message on pt's home phone the line is busy.

## 2018-02-21 DIAGNOSIS — J309 Allergic rhinitis, unspecified: Secondary | ICD-10-CM | POA: Diagnosis not present

## 2018-02-28 DIAGNOSIS — J309 Allergic rhinitis, unspecified: Secondary | ICD-10-CM | POA: Diagnosis not present

## 2018-03-25 DIAGNOSIS — H401132 Primary open-angle glaucoma, bilateral, moderate stage: Secondary | ICD-10-CM | POA: Diagnosis not present

## 2018-04-22 DIAGNOSIS — R05 Cough: Secondary | ICD-10-CM | POA: Diagnosis not present

## 2018-04-22 DIAGNOSIS — R0982 Postnasal drip: Secondary | ICD-10-CM | POA: Diagnosis not present

## 2018-05-01 DIAGNOSIS — J069 Acute upper respiratory infection, unspecified: Secondary | ICD-10-CM | POA: Diagnosis not present

## 2018-05-01 DIAGNOSIS — J449 Chronic obstructive pulmonary disease, unspecified: Secondary | ICD-10-CM | POA: Diagnosis not present

## 2018-05-06 DIAGNOSIS — J309 Allergic rhinitis, unspecified: Secondary | ICD-10-CM | POA: Diagnosis not present

## 2018-05-06 DIAGNOSIS — E559 Vitamin D deficiency, unspecified: Secondary | ICD-10-CM | POA: Diagnosis not present

## 2018-05-06 DIAGNOSIS — E039 Hypothyroidism, unspecified: Secondary | ICD-10-CM | POA: Diagnosis not present

## 2018-05-06 DIAGNOSIS — Z0000 Encounter for general adult medical examination without abnormal findings: Secondary | ICD-10-CM | POA: Diagnosis not present

## 2018-05-06 DIAGNOSIS — Z23 Encounter for immunization: Secondary | ICD-10-CM | POA: Diagnosis not present

## 2018-05-06 DIAGNOSIS — R600 Localized edema: Secondary | ICD-10-CM | POA: Diagnosis not present

## 2018-05-06 DIAGNOSIS — E7800 Pure hypercholesterolemia, unspecified: Secondary | ICD-10-CM | POA: Diagnosis not present

## 2018-05-06 DIAGNOSIS — Z79899 Other long term (current) drug therapy: Secondary | ICD-10-CM | POA: Diagnosis not present

## 2018-05-06 DIAGNOSIS — J471 Bronchiectasis with (acute) exacerbation: Secondary | ICD-10-CM | POA: Diagnosis not present

## 2018-05-12 ENCOUNTER — Other Ambulatory Visit: Payer: Self-pay | Admitting: Family Medicine

## 2018-05-12 DIAGNOSIS — I700 Atherosclerosis of aorta: Secondary | ICD-10-CM

## 2018-05-12 DIAGNOSIS — R600 Localized edema: Secondary | ICD-10-CM

## 2018-05-12 DIAGNOSIS — Z8249 Family history of ischemic heart disease and other diseases of the circulatory system: Secondary | ICD-10-CM

## 2018-05-28 ENCOUNTER — Inpatient Hospital Stay: Admission: RE | Admit: 2018-05-28 | Payer: Medicare HMO | Source: Ambulatory Visit

## 2018-06-12 ENCOUNTER — Other Ambulatory Visit: Payer: Self-pay | Admitting: Family Medicine

## 2018-06-12 DIAGNOSIS — I700 Atherosclerosis of aorta: Secondary | ICD-10-CM

## 2018-06-12 DIAGNOSIS — Z8249 Family history of ischemic heart disease and other diseases of the circulatory system: Secondary | ICD-10-CM

## 2018-06-23 ENCOUNTER — Inpatient Hospital Stay: Admission: RE | Admit: 2018-06-23 | Payer: Medicare HMO | Source: Ambulatory Visit

## 2018-06-23 ENCOUNTER — Other Ambulatory Visit: Payer: Medicare HMO

## 2018-06-26 ENCOUNTER — Other Ambulatory Visit: Payer: Self-pay | Admitting: Family Medicine

## 2018-06-26 DIAGNOSIS — R600 Localized edema: Secondary | ICD-10-CM

## 2018-07-01 ENCOUNTER — Other Ambulatory Visit: Payer: Medicare HMO

## 2018-07-02 ENCOUNTER — Ambulatory Visit
Admission: RE | Admit: 2018-07-02 | Discharge: 2018-07-02 | Disposition: A | Discharge status: Home / Self Care | Payer: Medicare HMO | Source: Ambulatory Visit | Attending: Family Medicine | Admitting: Family Medicine

## 2018-07-02 DIAGNOSIS — I700 Atherosclerosis of aorta: Secondary | ICD-10-CM | POA: Diagnosis not present

## 2018-07-02 DIAGNOSIS — R600 Localized edema: Secondary | ICD-10-CM

## 2018-07-02 DIAGNOSIS — Z8249 Family history of ischemic heart disease and other diseases of the circulatory system: Secondary | ICD-10-CM

## 2018-07-02 IMAGING — US VENOUS DOPPLER ULTRASOUND OF LEFT LOWER EXTREMITY
1 series · 13 of 24 positions shown · non-contrast
Comparison: None.

CLINICAL DATA: 78-year-old with left lower extremity edema.



[Series 1: venous doppler ultrasound of left lower extremity · 0.08mm/px · 13 of 33 slices shown]
[im 1/33]
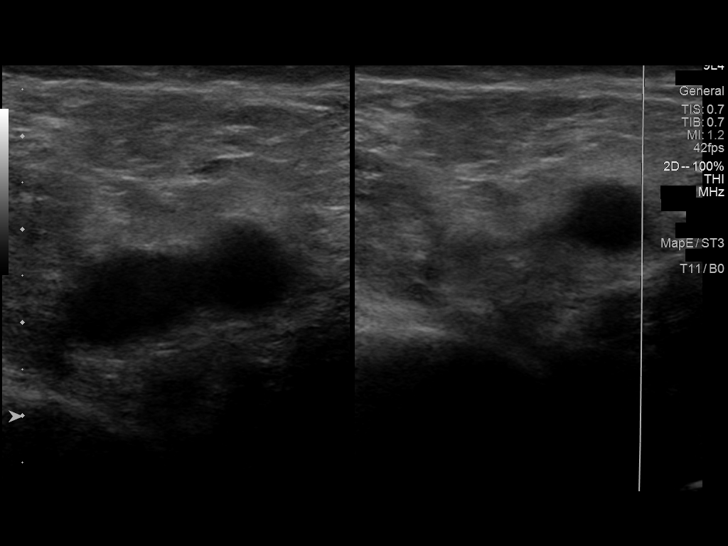
[im 3/33]
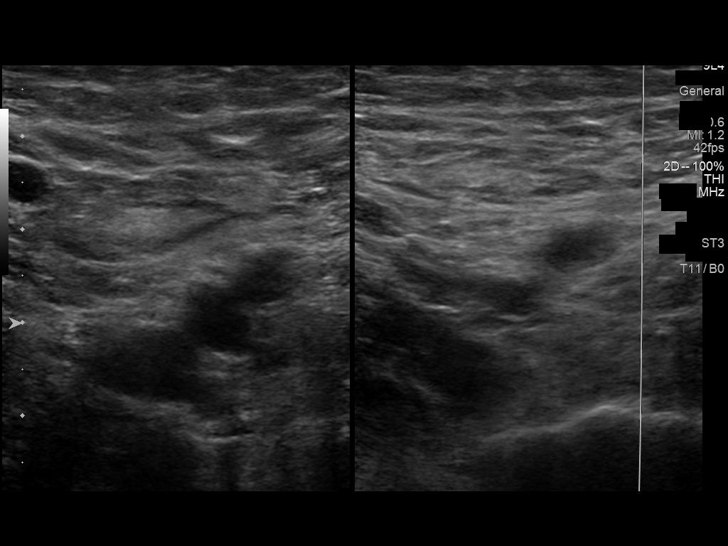
[im 6/33]
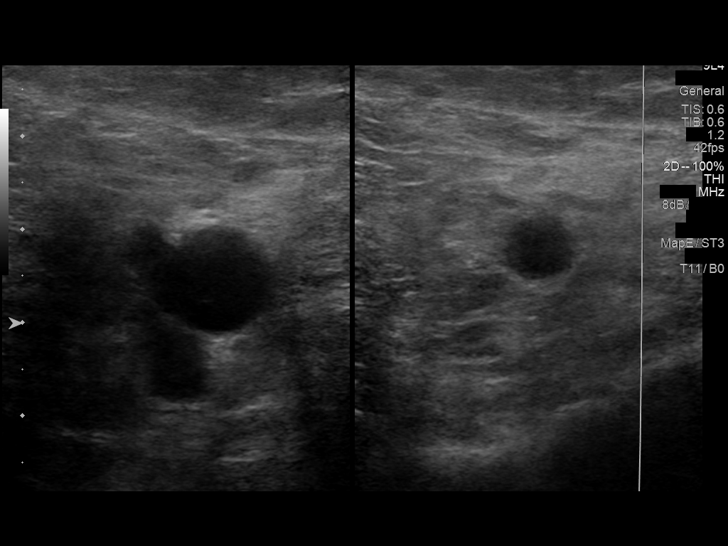
[im 9/33]
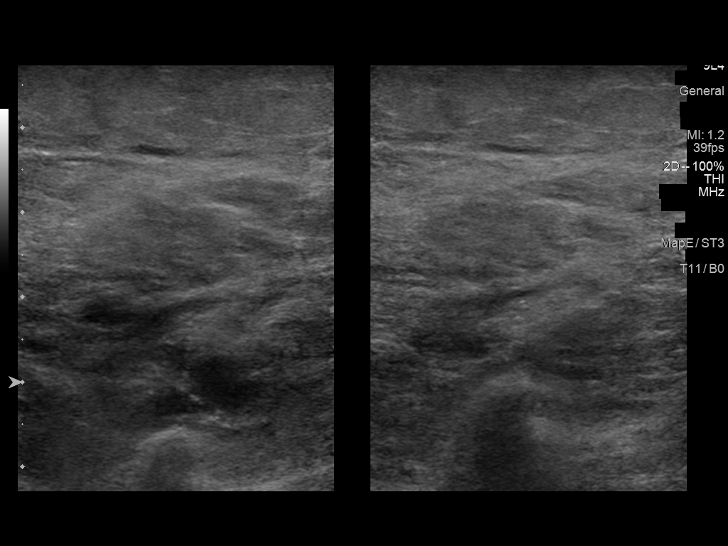
[im 12/33]
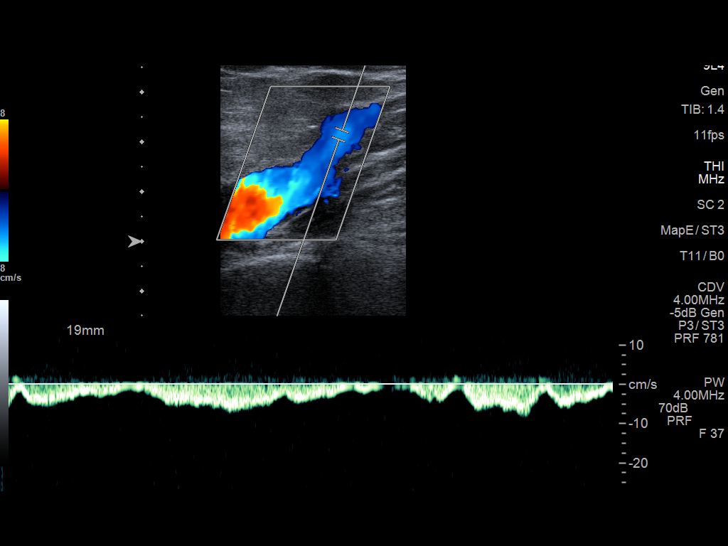
[im 14/33]
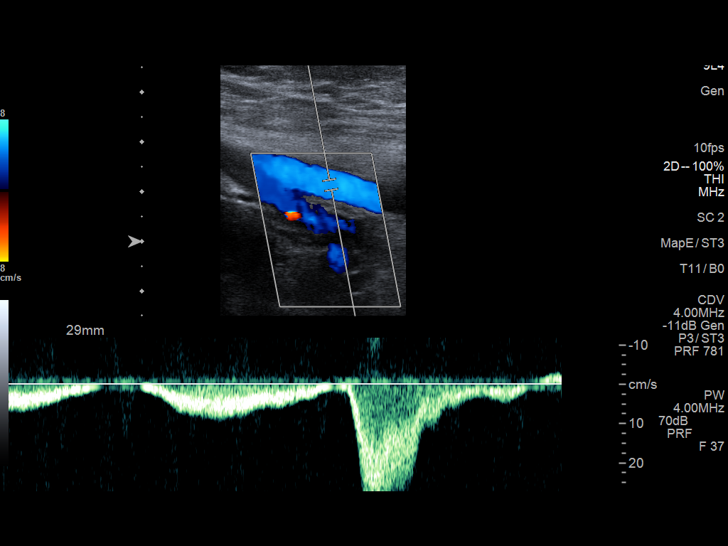
[im 17/33]
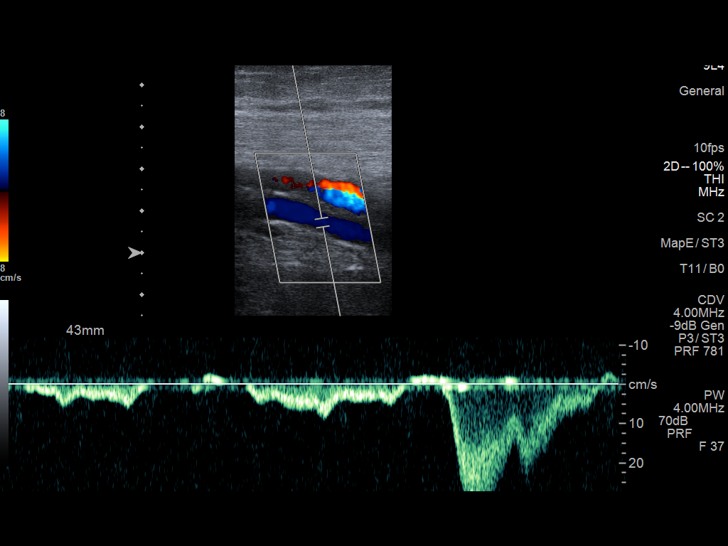
[im 19/33]
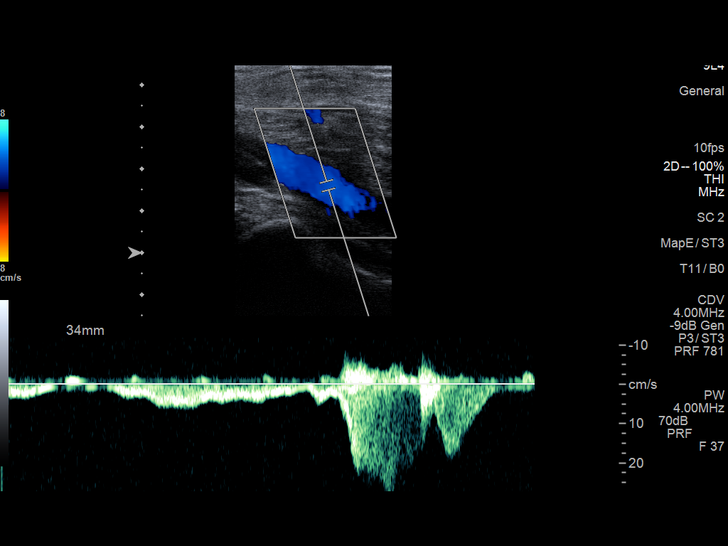
[im 21/33]
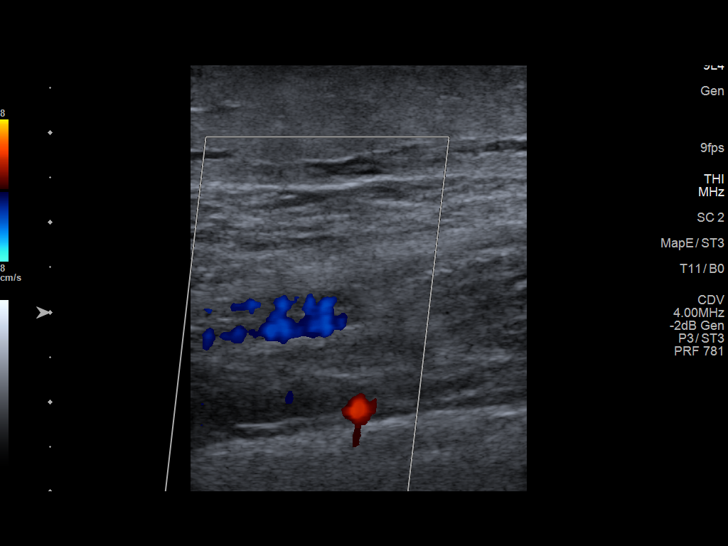
[im 24/33]
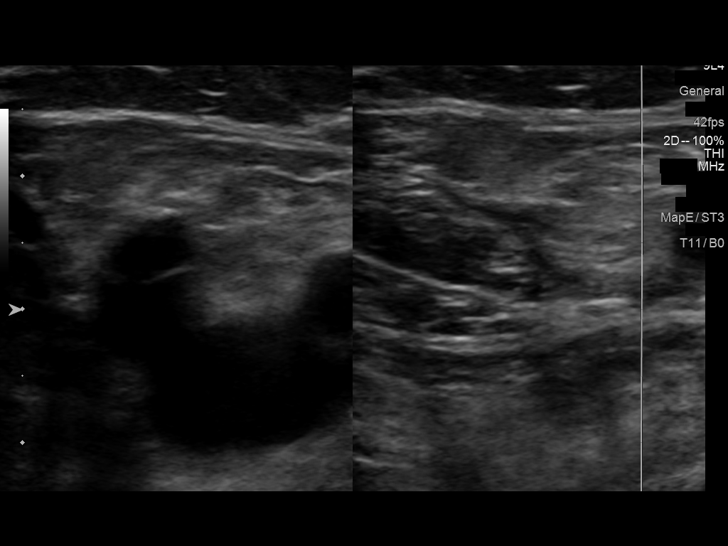
[im 27/33]
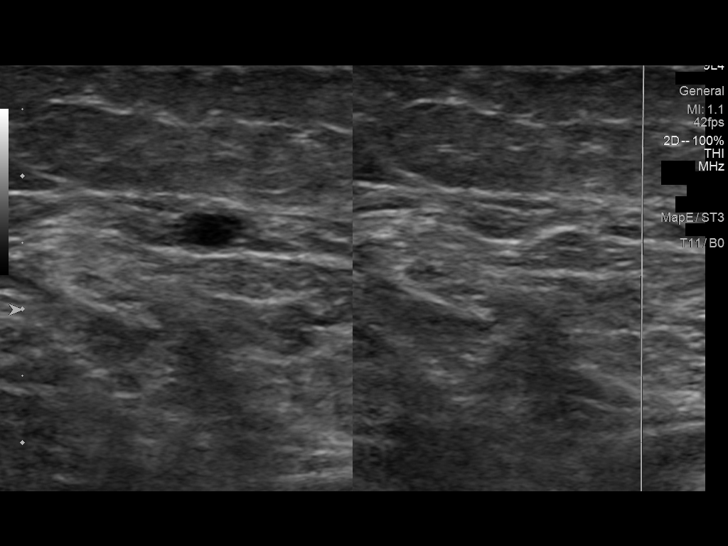
[im 30/33]
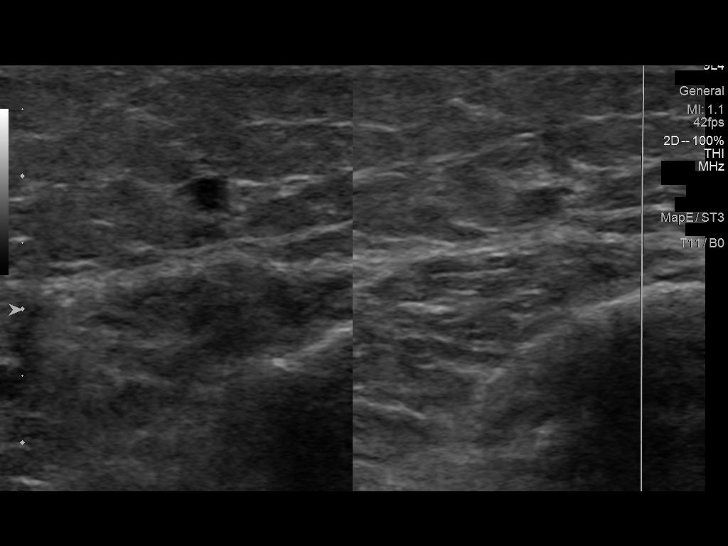
[im 33/33]
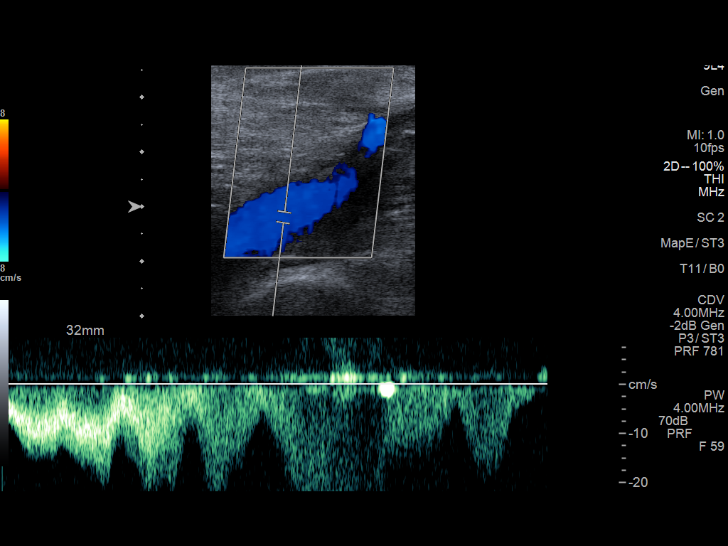

[13 of 24 positions shown; findings below may reference images not displayed]

FINDINGS: Contralateral Common Femoral Vein: Respiratory phasicity is normal
and symmetric with the symptomatic side. No evidence of thrombus.
Normal compressibility.

Common Femoral Vein: No evidence of thrombus. Normal
compressibility, respiratory phasicity and response to augmentation.

Saphenofemoral Junction: No evidence of thrombus. Normal
compressibility and flow on color Doppler imaging.

Profunda Femoral Vein: No evidence of thrombus. Normal
compressibility and flow on color Doppler imaging.

Femoral Vein: No evidence of thrombus. Normal compressibility,
respiratory phasicity and response to augmentation.

Popliteal Vein: No evidence of thrombus. Normal compressibility,
respiratory phasicity and response to augmentation.

Calf Veins: No evidence of thrombus. Normal compressibility and flow
on color Doppler imaging.

Superficial Great Saphenous Vein: No evidence of thrombus. Normal
compressibility.

Venous Reflux:  None.

Other Findings:  None.
IMPRESSION: Negative for deep venous thrombosis in left lower extremity.

## 2018-07-02 IMAGING — US ULTRASOUND AORTA
1 series · 14 of 23 positions shown · non-contrast
Comparison: Chest CT [DATE]

CLINICAL DATA: Family history of abdominal aortic aneurysm

EXAM:
ULTRASOUND OF ABDOMINAL AORTA
TECHNIQUE: Ultrasound examination of the abdominal aorta and proximal common
iliac arteries was performed to evaluate for aneurysm. Additional
color and Doppler images of the distal aorta were obtained to
document patency.

[Series 1: ultrasound aorta · 0.20mm/px · 14 of 23 slices shown]
[im 1/23]
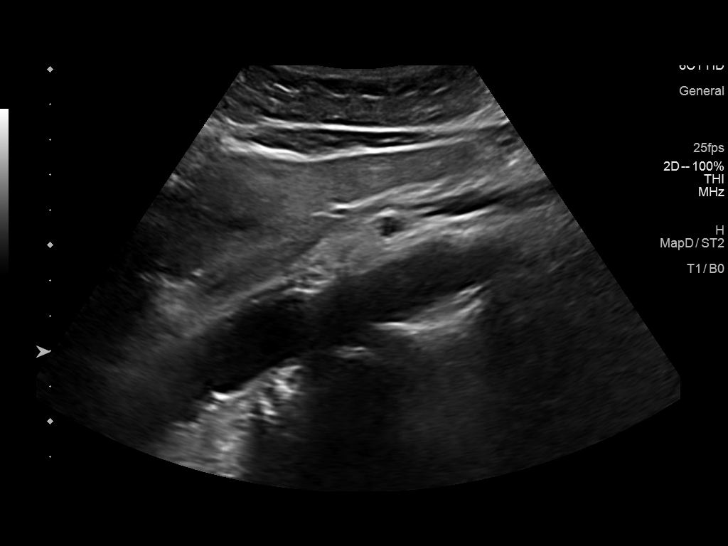
[im 3/23]
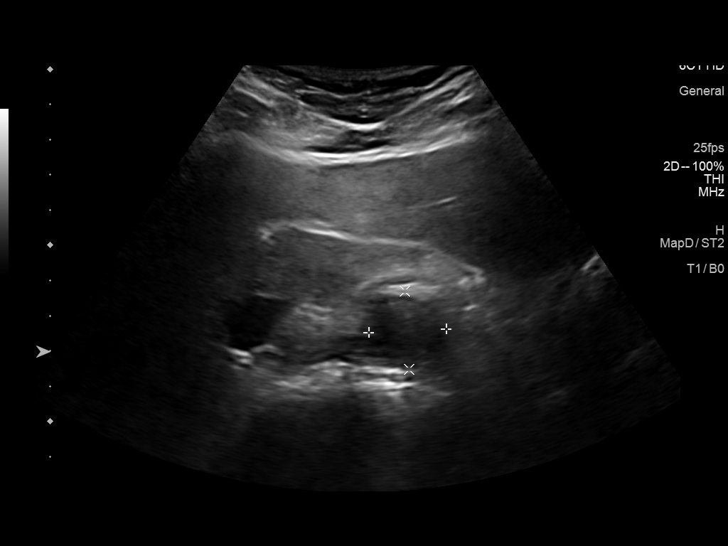
[im 5/23]
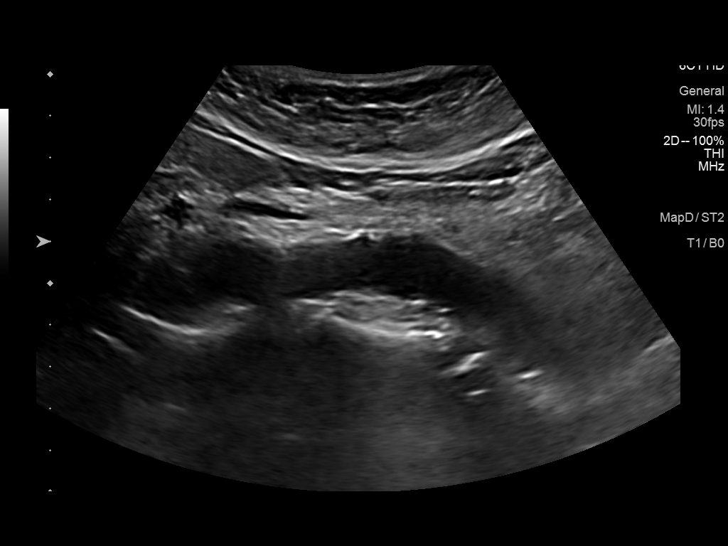
[im 6/23]
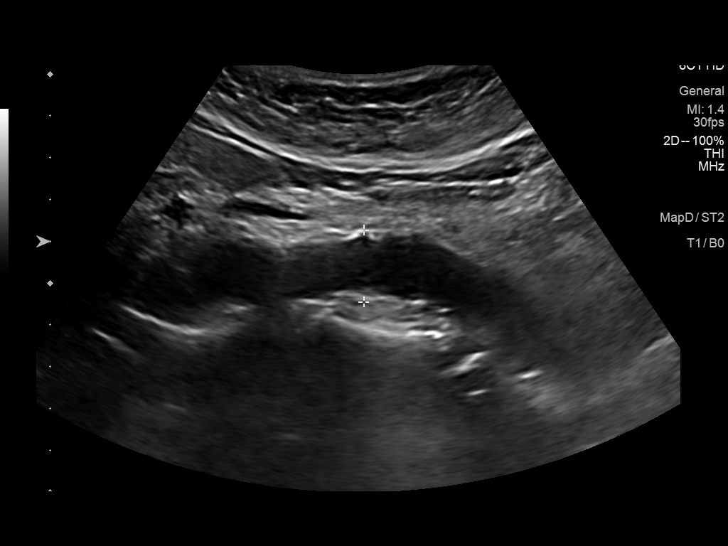
[im 8/23]
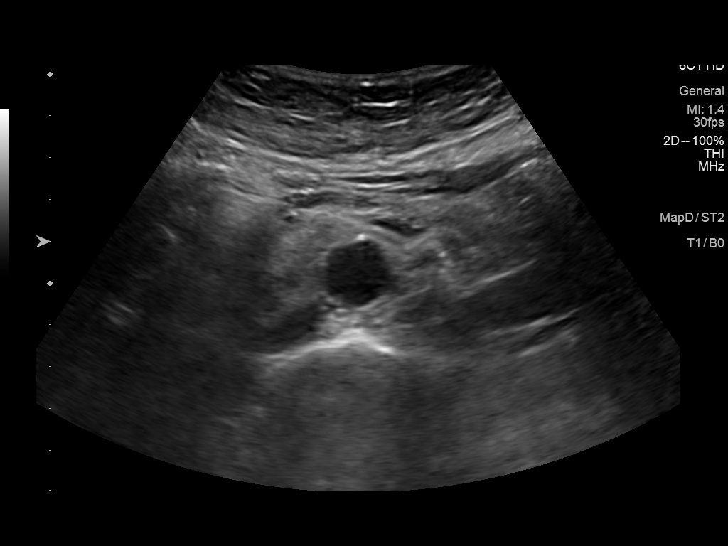
[im 10/23]
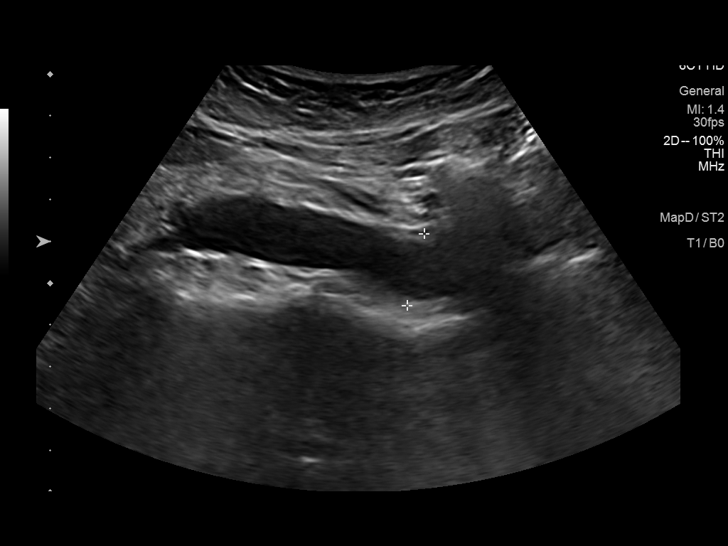
[im 11/23]
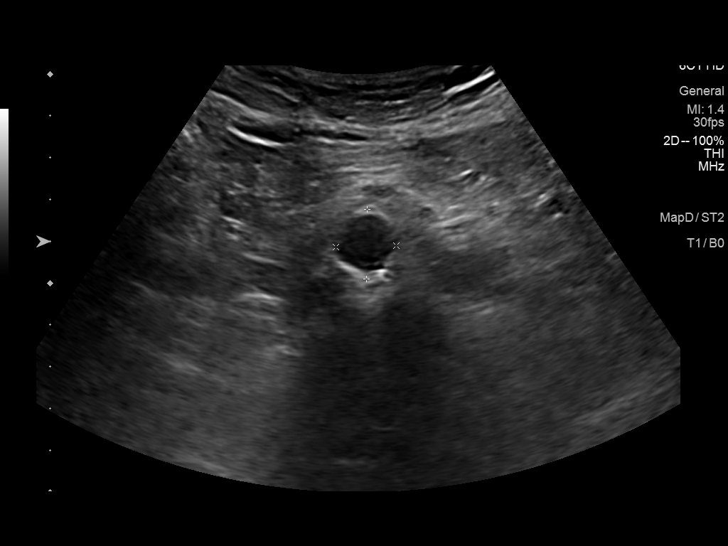
[im 13/23]
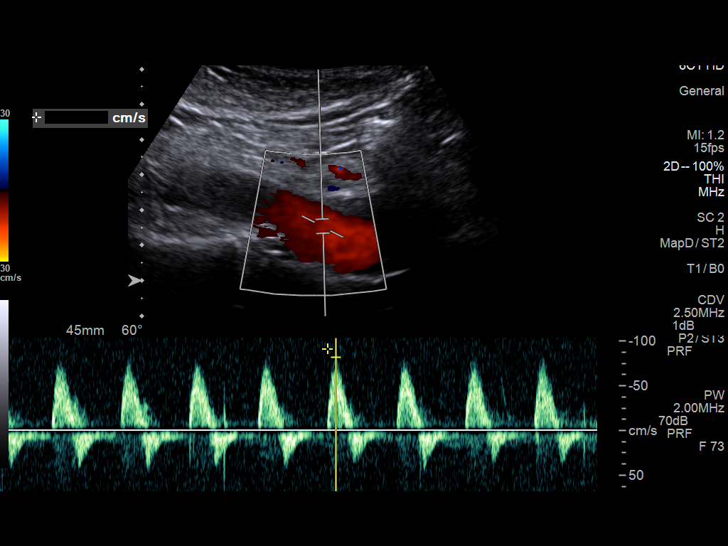
[im 14/23]
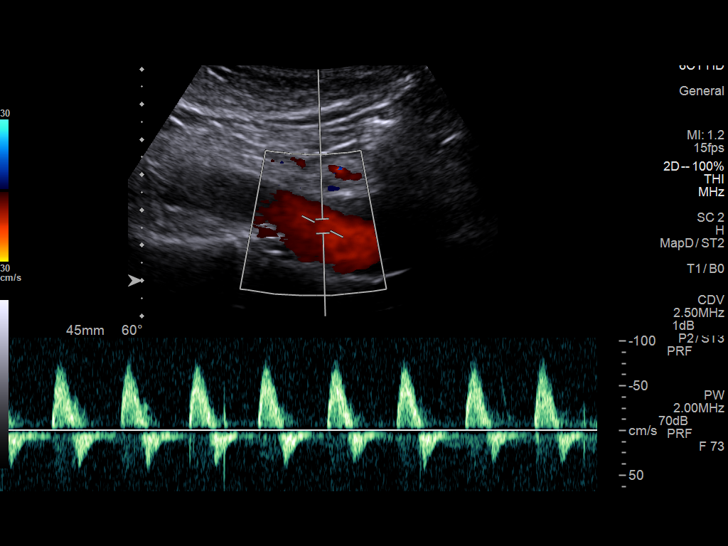
[im 16/23]
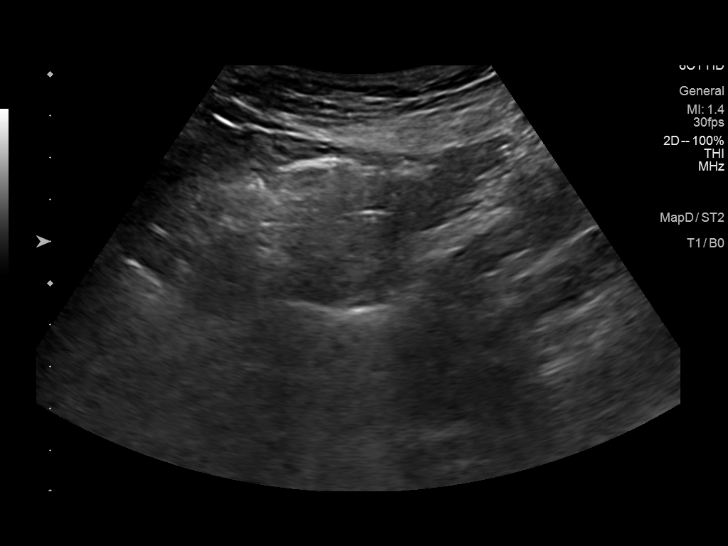
[im 18/23]
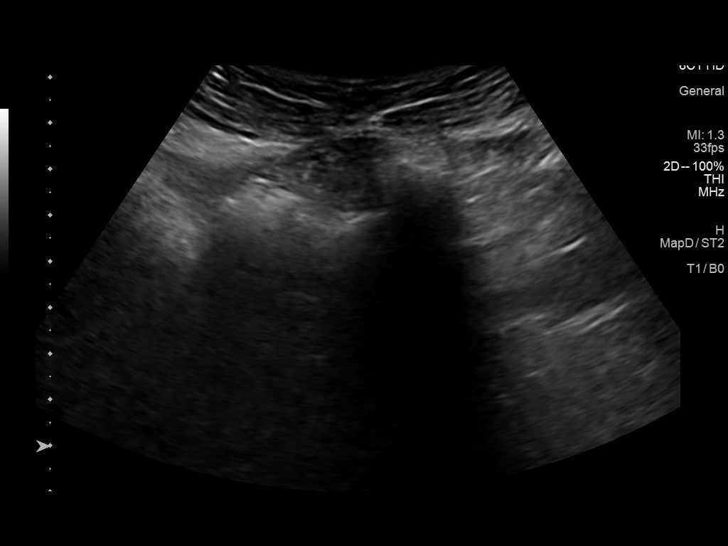
[im 19/23]
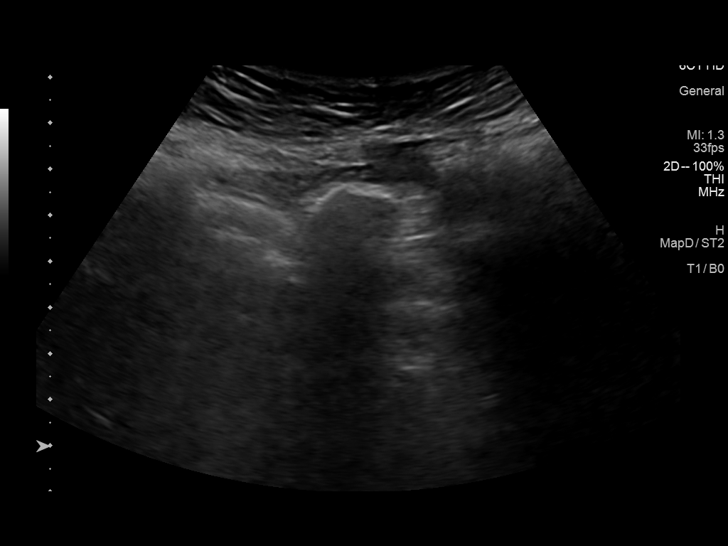
[im 21/23]
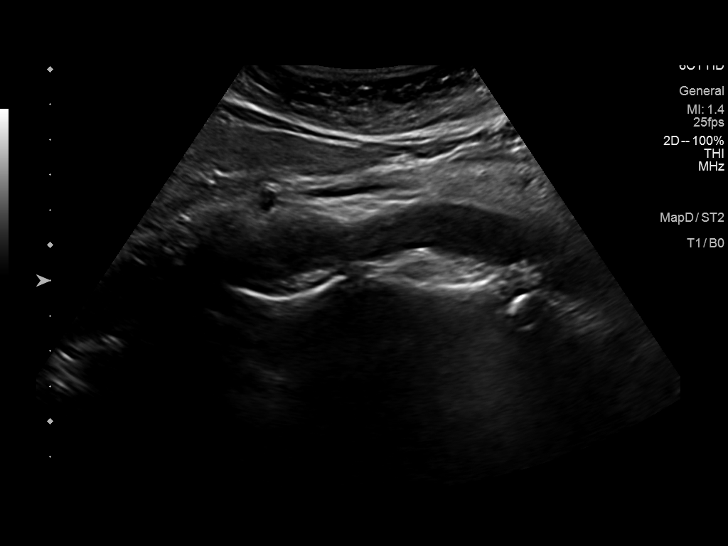
[im 23/23]
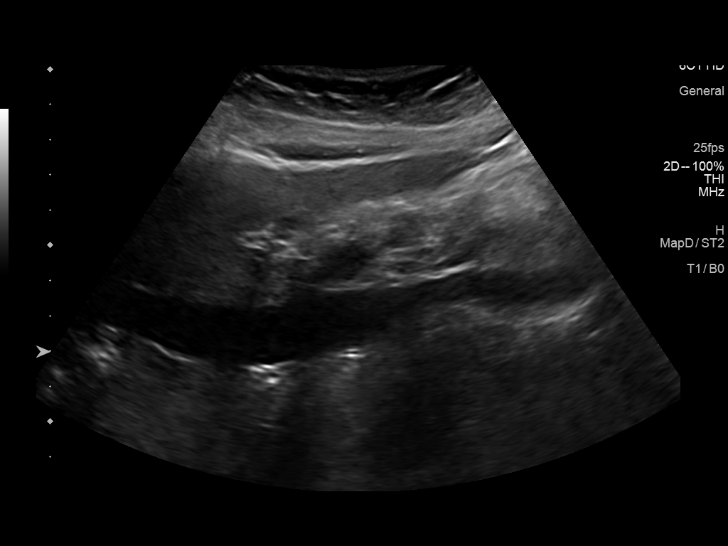

[14 of 23 positions shown; findings below may reference images not displayed]

FINDINGS: Abdominal aortic measurements as follows:

Proximal:  2.3 cm

Mid:  1.8 cm

Distal:  1.8 cm
Patent: Yes, peak systolic velocity is 81.9 cm/s

Atherosclerotic irregularity and calcifications noted.

Right common iliac artery: Not visualized due to overlying bowel gas

Left common iliac artery: Not visualized due to overlying bowel gas
IMPRESSION: Aortic atherosclerosis.  No evidence of aneurysm.

## 2018-07-10 ENCOUNTER — Telehealth: Payer: Self-pay | Admitting: *Deleted

## 2018-07-10 NOTE — Telephone Encounter (Signed)

## 2018-07-11 ENCOUNTER — Other Ambulatory Visit: Payer: Self-pay

## 2018-07-11 ENCOUNTER — Ambulatory Visit (INDEPENDENT_AMBULATORY_CARE_PROVIDER_SITE_OTHER)
Admission: RE | Admit: 2018-07-11 | Discharge: 2018-07-11 | Disposition: A | Discharge status: Home / Self Care | Payer: Medicare HMO | Source: Ambulatory Visit | Attending: Internal Medicine | Admitting: Internal Medicine

## 2018-07-11 DIAGNOSIS — R918 Other nonspecific abnormal finding of lung field: Secondary | ICD-10-CM

## 2018-07-11 IMAGING — CT CT CHEST WITHOUT CONTRAST
2 of 3 series · 15 of 36 positions shown, 18 images · non-contrast
Comparison: [DATE], [DATE] and [DATE].

CLINICAL DATA: Pulmonary nodules.

EXAM:
CT CHEST WITHOUT CONTRAST
TECHNIQUE: Multidetector CT imaging of the chest was performed following the
standard protocol without IV contrast.

[Series 2: thorax · axial · 0.64mm/px · z∈[-319,-35]mm · 12 of 168 slices shown, 15 images]
[im 13/168  mediastinal]
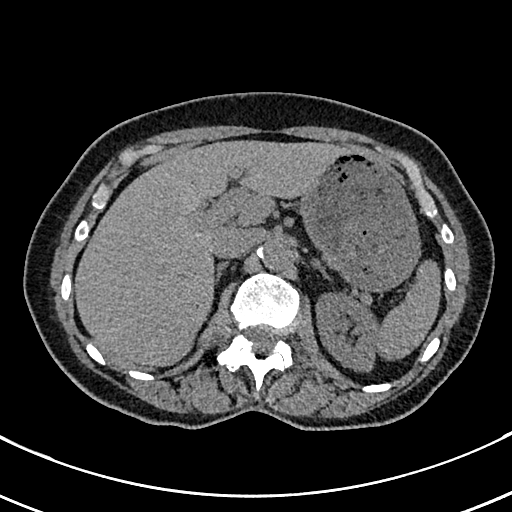
[im 13/168  lung]
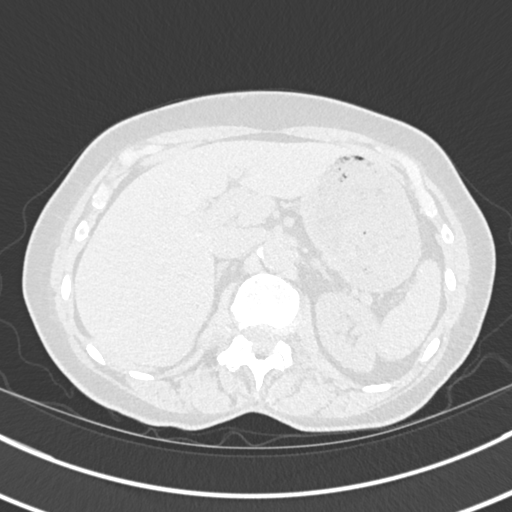
[im 25/168  lung]
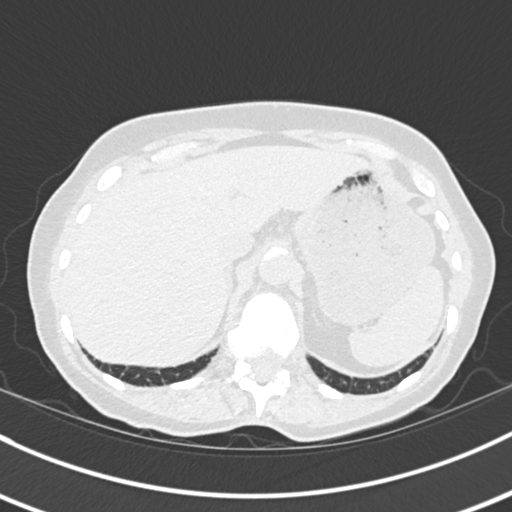
[im 38/168  lung]
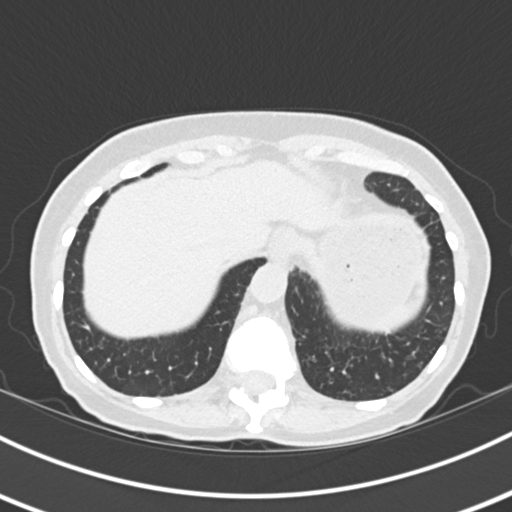
[im 50/168  lung]
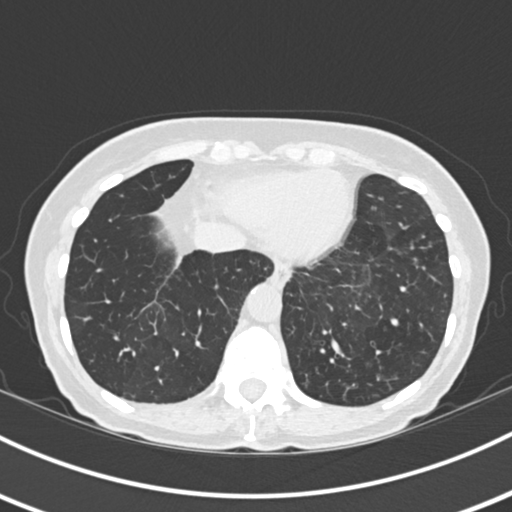
[im 62/168  mediastinal]
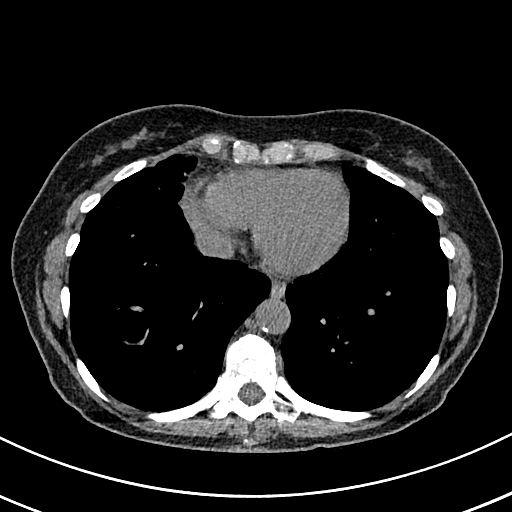
[im 62/168  lung]
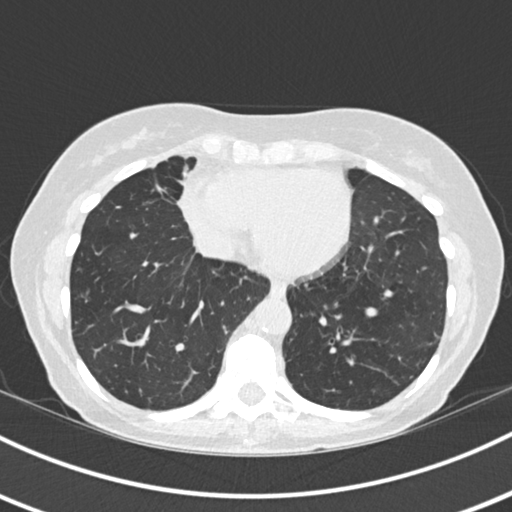
[im 75/168  lung]
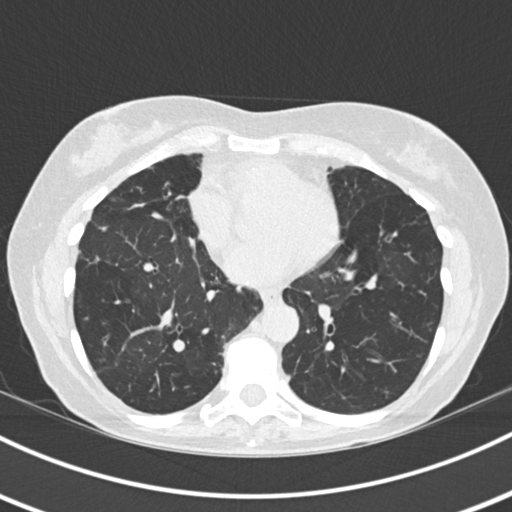
[im 93/168  lung]
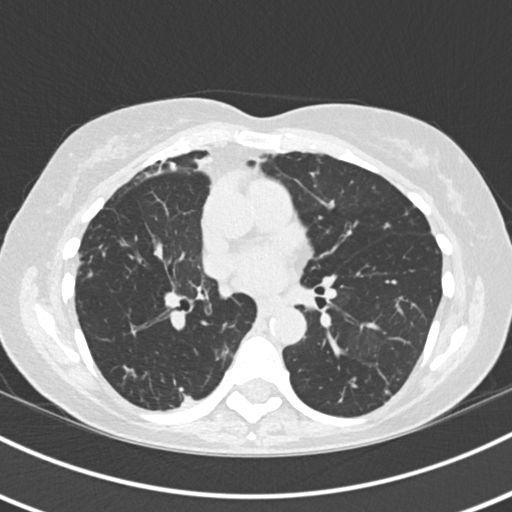
[im 106/168  lung]
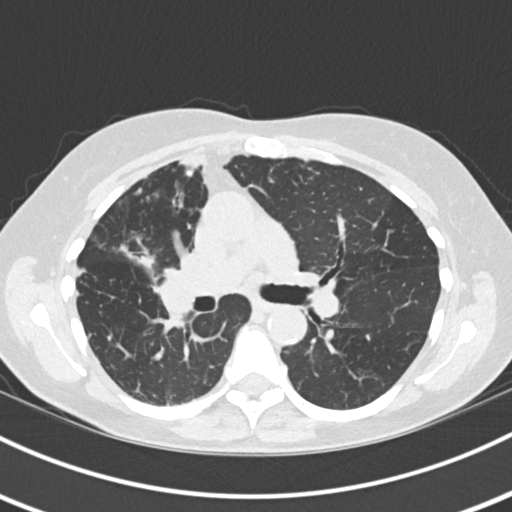
[im 118/168  mediastinal]
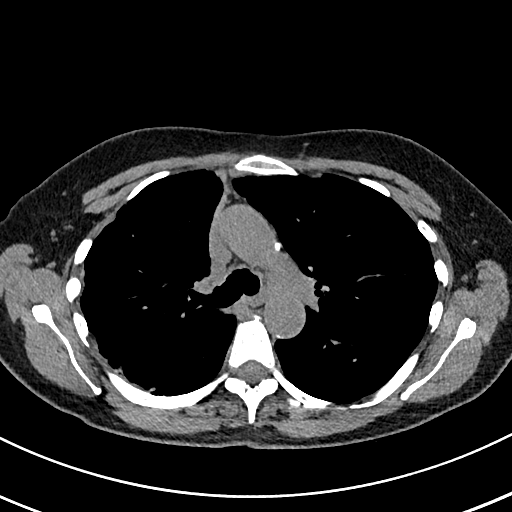
[im 118/168  lung]
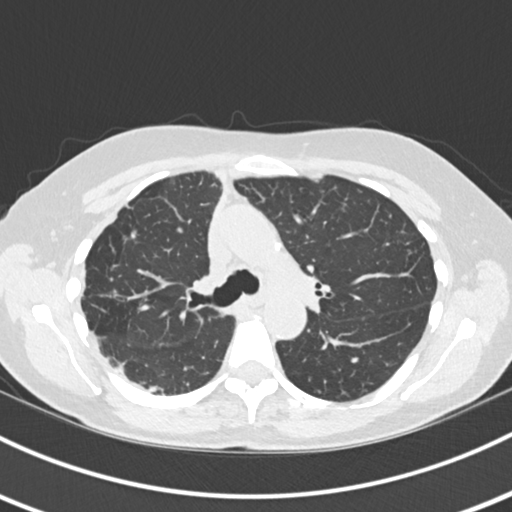
[im 130/168  lung]
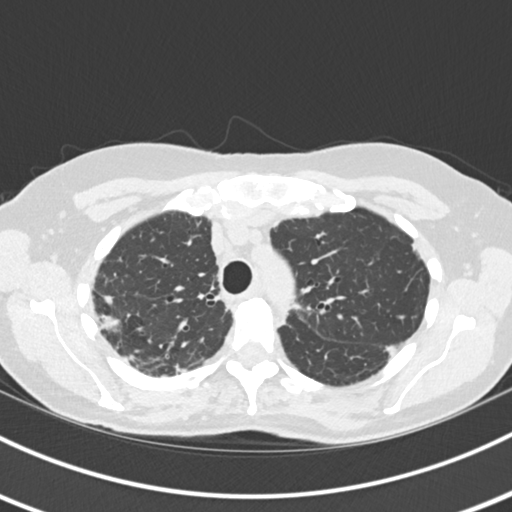
[im 143/168  lung]
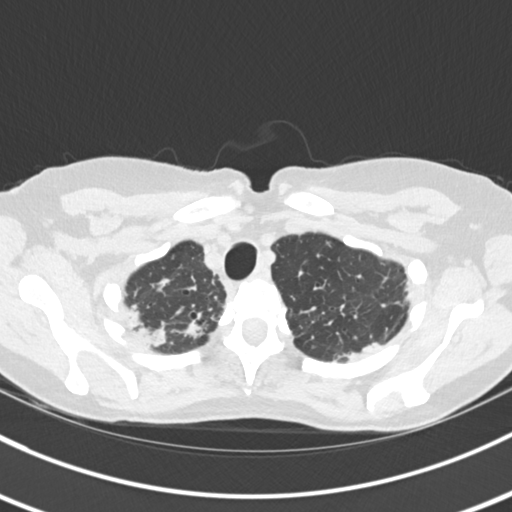
[im 155/168  lung]
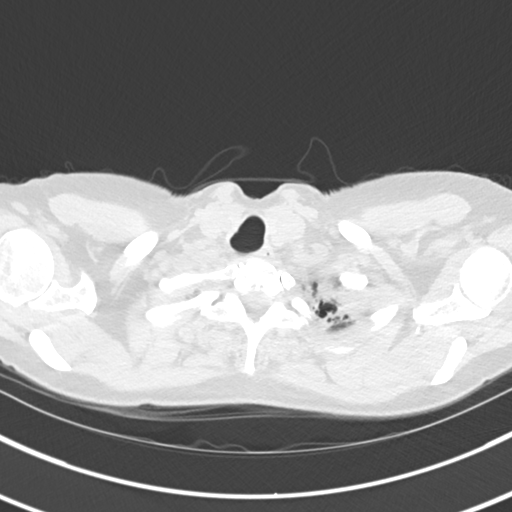

[Series 5: coronal · coronal · 0.66mm/px · 3 of 113 slices shown]
[im 23/113  lung]
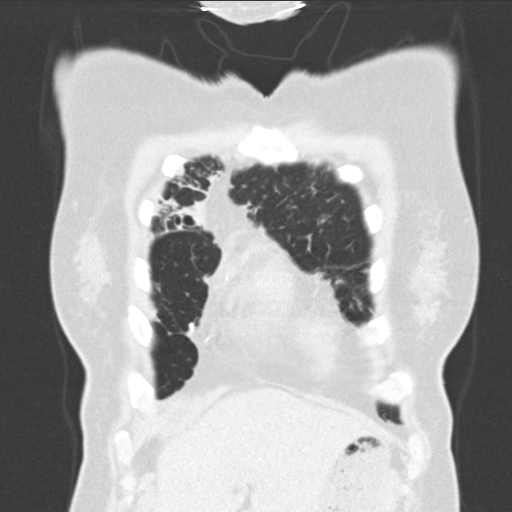
[im 45/113  lung]
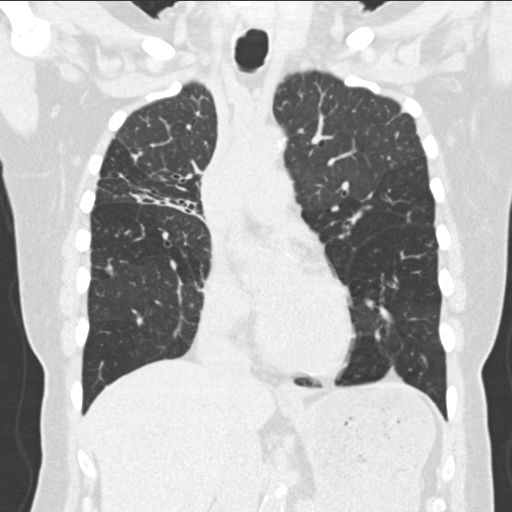
[im 68/113  lung]
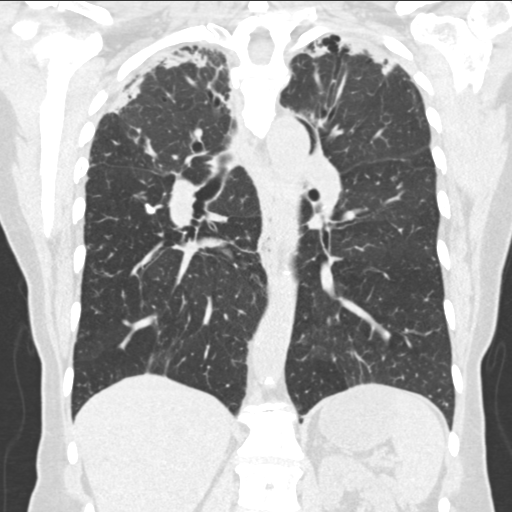

[15 of 36 positions shown; findings below may reference images not displayed]

FINDINGS: Cardiovascular: Atherosclerotic calcification of the aorta and
coronary arteries. Heart size normal. No pericardial effusion.

Mediastinum/Nodes: Mediastinal lymph nodes measure up to 11 mm in
the low right paratracheal station, as before. Hilar regions are
difficult to definitively evaluate without IV contrast. No axillary
adenopathy. Esophagus is grossly unremarkable.

Lungs/Pleura: Biapical pleuroparenchymal scarring with
bronchiectasis and architectural distortion. Multiple areas of
peribronchovascular nodularity and nodular consolidation are seen as
well, similar to [DATE]. Largest individual nodule is seen in
the posterior right lower lobe, measuring 1.9 x 2.1 cm (series 3,
image 82), stable from [DATE]. Central bronchiectasis. No
pleural fluid. Airway is unremarkable.

Upper Abdomen: Visualized portions of the liver, adrenal glands,
left kidney, spleen, pancreas and stomach are grossly unremarkable.

Musculoskeletal: Advanced degenerative disc disease at T12-L1. No
worrisome lytic or sclerotic lesions.
IMPRESSION: 1. Peribronchovascular nodularity/nodular consolidation with
bronchiectasis and architectural distortion, similar to [DATE]
and likely due to mycobacterium avium complex.
2. Dominant right lower lobe nodule is stable. If additional
follow-up is desired, CT chest without contrast in 1 year could be
performed.
3. Aortic atherosclerosis ([JH]-170.0). Coronary artery
calcification.

## 2018-07-18 DIAGNOSIS — R600 Localized edema: Secondary | ICD-10-CM | POA: Diagnosis not present

## 2018-07-23 ENCOUNTER — Encounter: Payer: Self-pay | Admitting: Internal Medicine

## 2018-07-30 DIAGNOSIS — H401132 Primary open-angle glaucoma, bilateral, moderate stage: Secondary | ICD-10-CM | POA: Diagnosis not present

## 2018-08-15 DIAGNOSIS — M25571 Pain in right ankle and joints of right foot: Secondary | ICD-10-CM | POA: Diagnosis not present

## 2018-08-15 DIAGNOSIS — R600 Localized edema: Secondary | ICD-10-CM | POA: Diagnosis not present

## 2018-08-20 DIAGNOSIS — H401132 Primary open-angle glaucoma, bilateral, moderate stage: Secondary | ICD-10-CM | POA: Diagnosis not present

## 2018-08-20 DIAGNOSIS — H5201 Hypermetropia, right eye: Secondary | ICD-10-CM | POA: Diagnosis not present

## 2018-08-20 DIAGNOSIS — H52223 Regular astigmatism, bilateral: Secondary | ICD-10-CM | POA: Diagnosis not present

## 2018-08-20 DIAGNOSIS — H5212 Myopia, left eye: Secondary | ICD-10-CM | POA: Diagnosis not present

## 2018-08-25 ENCOUNTER — Other Ambulatory Visit: Payer: Self-pay | Admitting: Family Medicine

## 2018-08-29 DIAGNOSIS — R600 Localized edema: Secondary | ICD-10-CM | POA: Diagnosis not present

## 2018-09-23 DIAGNOSIS — H401131 Primary open-angle glaucoma, bilateral, mild stage: Secondary | ICD-10-CM | POA: Diagnosis not present

## 2018-09-23 DIAGNOSIS — H2511 Age-related nuclear cataract, right eye: Secondary | ICD-10-CM | POA: Diagnosis not present

## 2018-09-26 ENCOUNTER — Other Ambulatory Visit: Payer: Self-pay | Admitting: Family Medicine

## 2018-09-26 DIAGNOSIS — Z1231 Encounter for screening mammogram for malignant neoplasm of breast: Secondary | ICD-10-CM

## 2018-10-08 ENCOUNTER — Ambulatory Visit: Payer: Medicare HMO

## 2018-10-09 DIAGNOSIS — H2511 Age-related nuclear cataract, right eye: Secondary | ICD-10-CM | POA: Diagnosis not present

## 2018-10-09 DIAGNOSIS — H25811 Combined forms of age-related cataract, right eye: Secondary | ICD-10-CM | POA: Diagnosis not present

## 2018-10-10 ENCOUNTER — Other Ambulatory Visit: Payer: Self-pay | Admitting: Family Medicine

## 2018-10-10 DIAGNOSIS — N6011 Diffuse cystic mastopathy of right breast: Secondary | ICD-10-CM

## 2018-10-14 ENCOUNTER — Ambulatory Visit
Admission: RE | Admit: 2018-10-14 | Discharge: 2018-10-14 | Disposition: A | Discharge status: Home / Self Care | Payer: Medicare HMO | Source: Ambulatory Visit | Attending: Family Medicine | Admitting: Family Medicine

## 2018-10-14 ENCOUNTER — Other Ambulatory Visit: Payer: Self-pay

## 2018-10-14 DIAGNOSIS — R928 Other abnormal and inconclusive findings on diagnostic imaging of breast: Secondary | ICD-10-CM | POA: Diagnosis not present

## 2018-10-14 DIAGNOSIS — N6011 Diffuse cystic mastopathy of right breast: Secondary | ICD-10-CM

## 2018-10-14 IMAGING — MG DIGITAL DIAGNOSTIC BILATERAL MAMMOGRAM WITH TOMO AND CAD
6 of 10 series · 6 of 30 positions shown · non-contrast
Comparison: Previous exam(s).

CLINICAL DATA: Patient presents with a palpable concern in the
right breast

EXAM:
DIGITAL DIAGNOSTIC BILATERAL MAMMOGRAM WITH CAD AND TOMO
ULTRASOUND RIGHT BREAST

[L CC synth-2D]
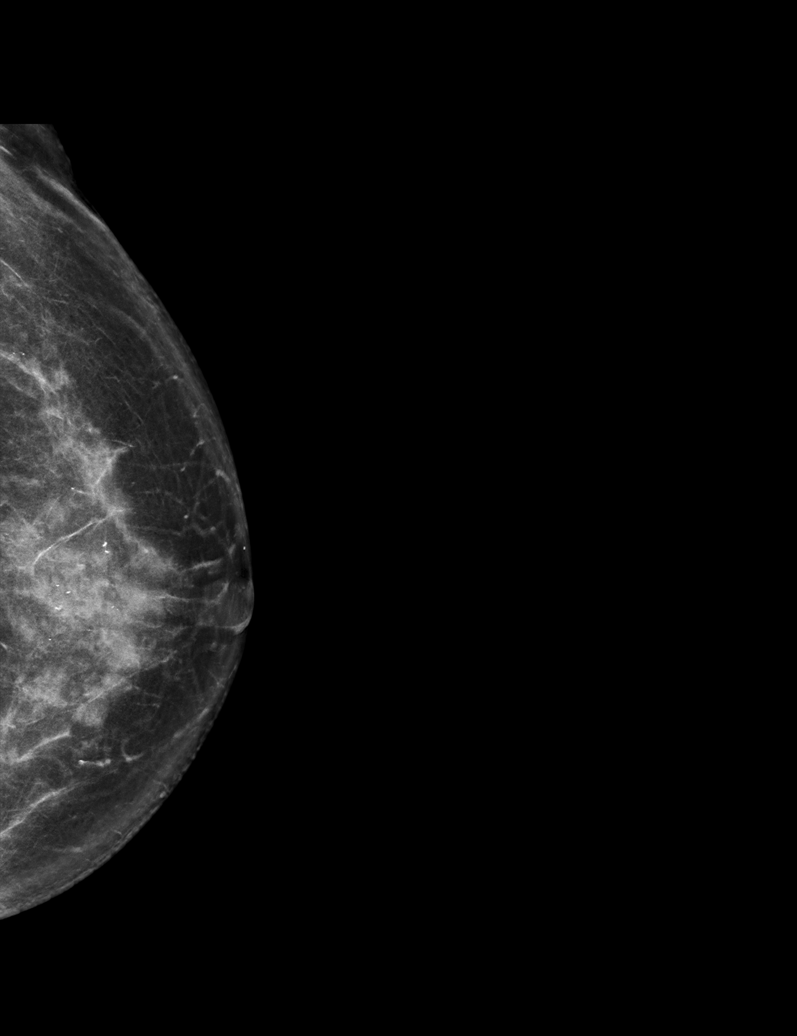

[R MLO synth-2D (1 of 2)]
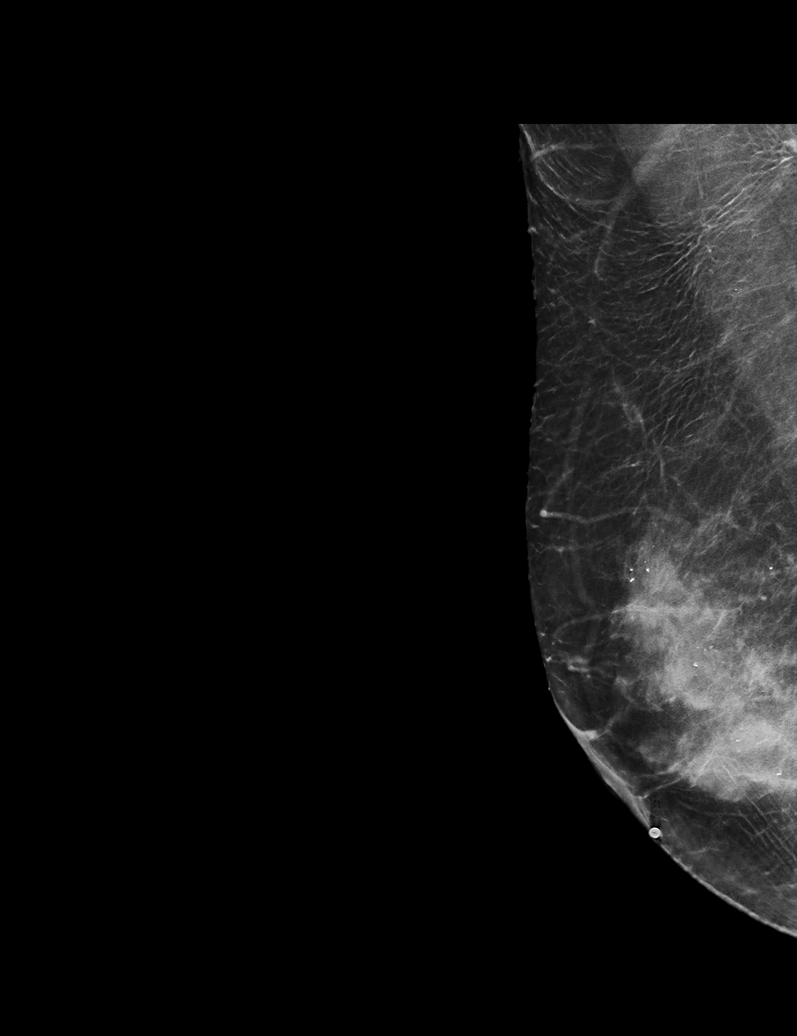

[R MLO synth-2D (2 of 2)]
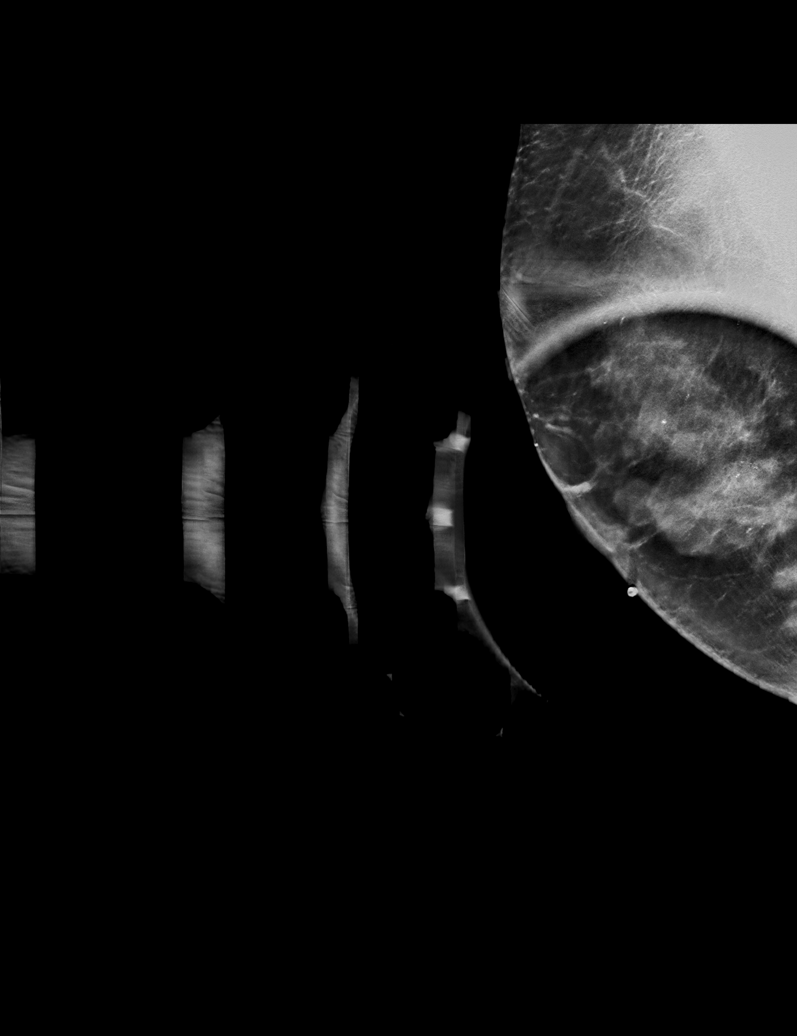

[R CC synth-2D]
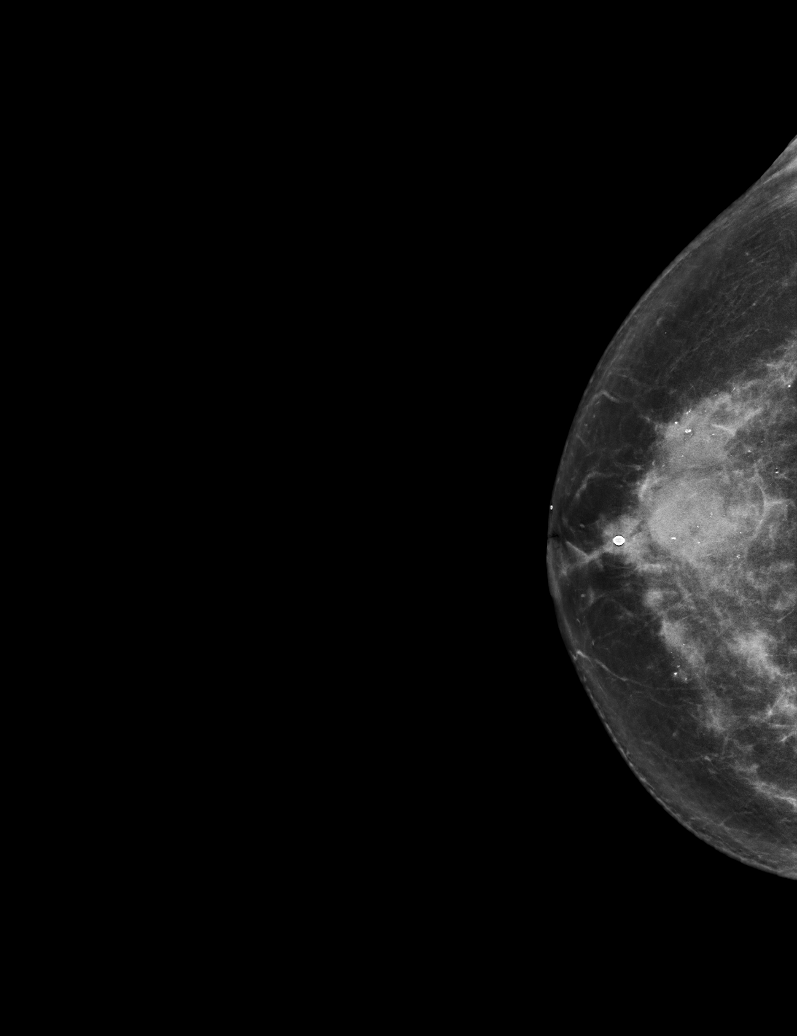

[L MLO synth-2D]
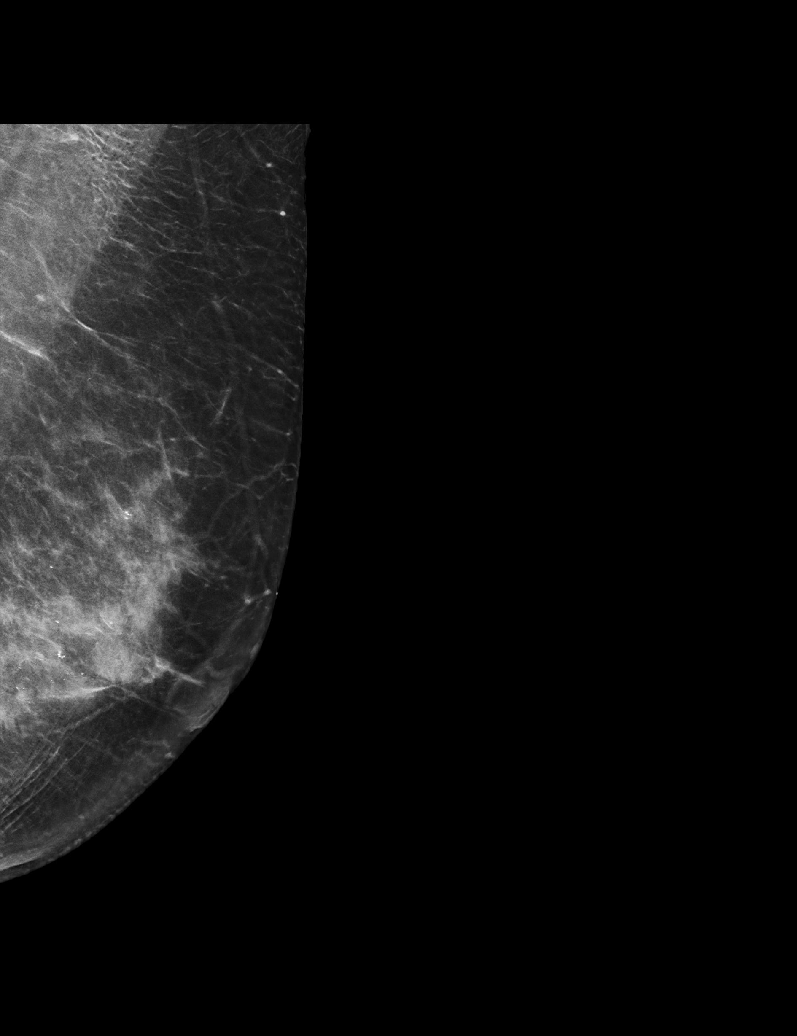

[R MLO tomo · tomo slice 37/73.0]
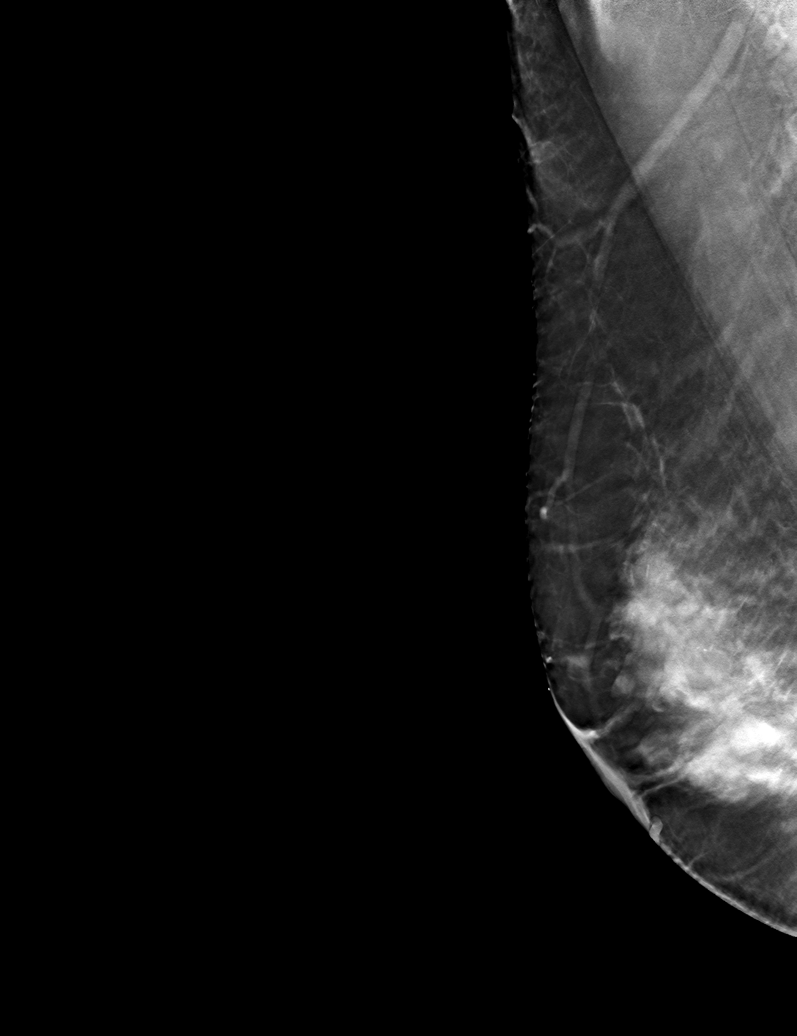

[6 of 30 positions shown; findings below may reference images not displayed]

ACR Breast Density Category c: The breast tissue is heterogeneously
dense, which may obscure small masses.
FINDINGS: Mammogram: At the palpable site of concern in the inferior right
breast there are multiple round and oval circumscribed masses,
largest measuring up to 2.7 cm. No mammographic evidence of
malignancy in the bilateral breasts.

Mammographic images were processed with CAD.

On physical exam, I palpate at least one smooth mobile mass in the
inferior right breast at the palpable site of concern.

Targeted ultrasound is performed, showing multiple round and oval
anechoic circumscribed masses at 6 o'clock 1 cm from the nipple,
corresponding to the palpable site of concern. A group of clustered
masses overall measures 2.5 x 2.5 x 2.1 cm, likely corresponding to
the mass seen on mammogram. No internal blood flow identified.
IMPRESSION: Multiple simple cysts in the right breast at 6 o'clock are benign,
corresponding to the palpable site of concern. No mammographic or
sonographic evidence of malignancy.

RECOMMENDATION:
Screening mammogram in one year.(Code:[12])

Discussed with the patient the possibility of therapeutic aspiration
should the cysts become painful or bothersome. Patient states she
will contact our office at some point in the future should she need
aspiration.

I have discussed the findings and recommendations with the patient.
Results were also provided in writing at the conclusion of the
visit. If applicable, a reminder letter will be sent to the patient
regarding the next appointment.

BI-RADS CATEGORY  2: Benign.

## 2018-10-14 IMAGING — US ULTRASOUND RIGHT BREAST LIMITED
1 series · 5 of 5 positions shown · non-contrast
Comparison: Previous exam(s).

CLINICAL DATA: Patient presents with a palpable concern in the
right breast

EXAM:
DIGITAL DIAGNOSTIC BILATERAL MAMMOGRAM WITH CAD AND TOMO
ULTRASOUND RIGHT BREAST

[Series 1: ultrasound right breast limited · 0.07mm/px · 5 of 5 slices shown]
[im 1/5]
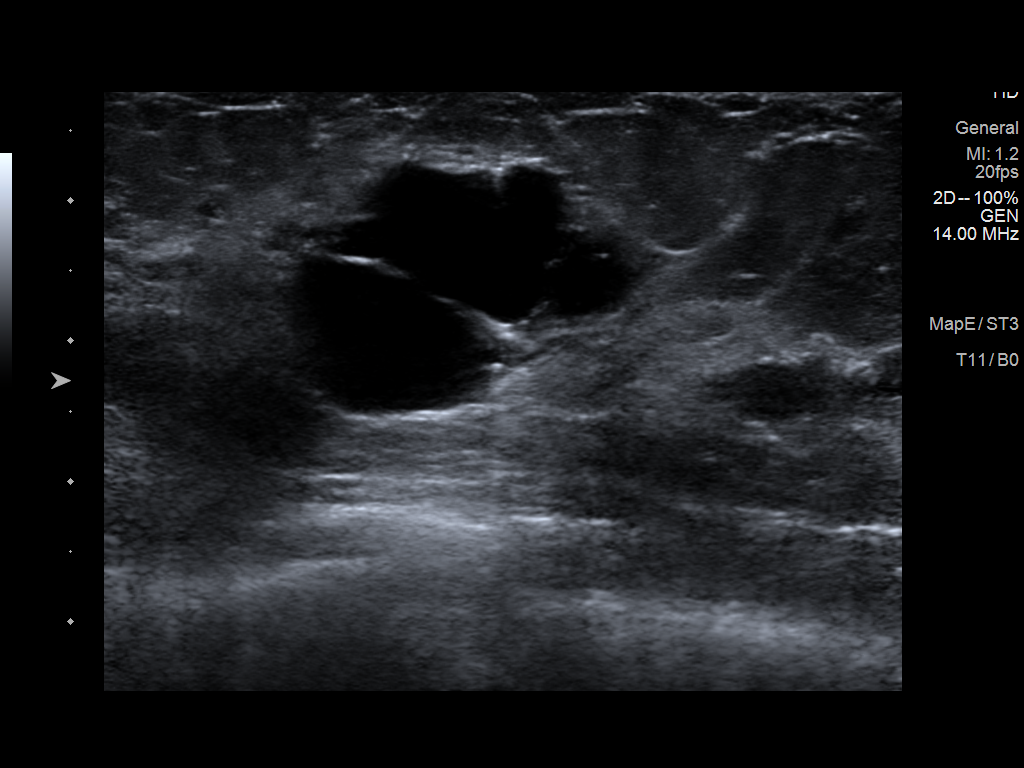
[im 2/5]
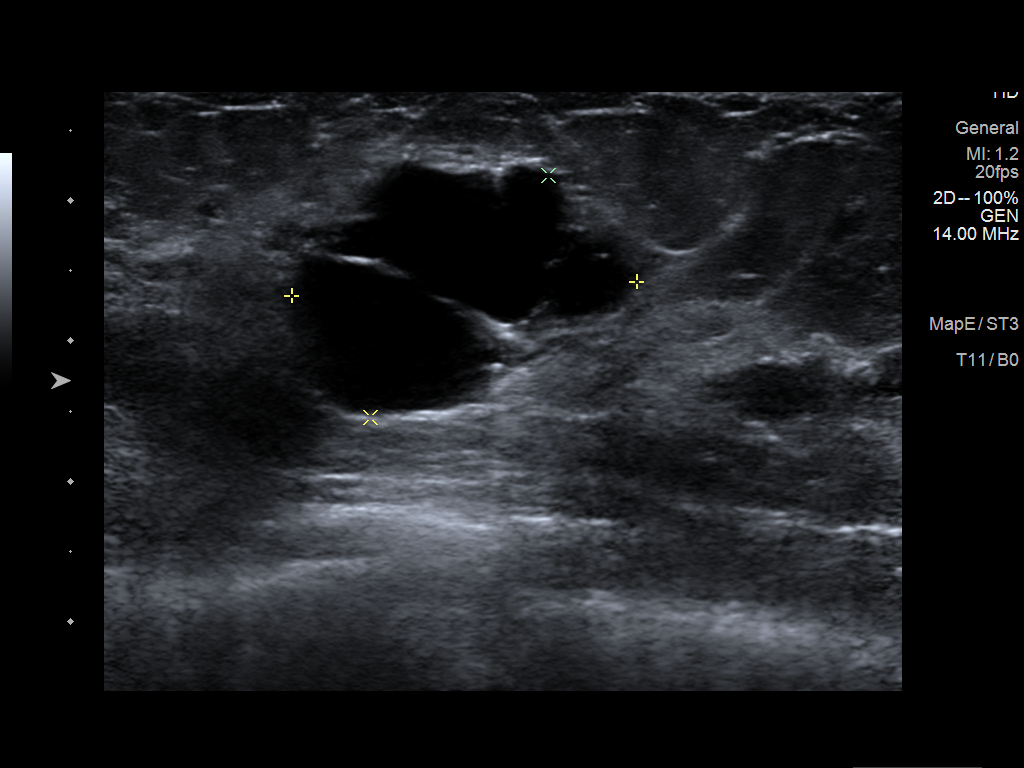
[im 3/5]
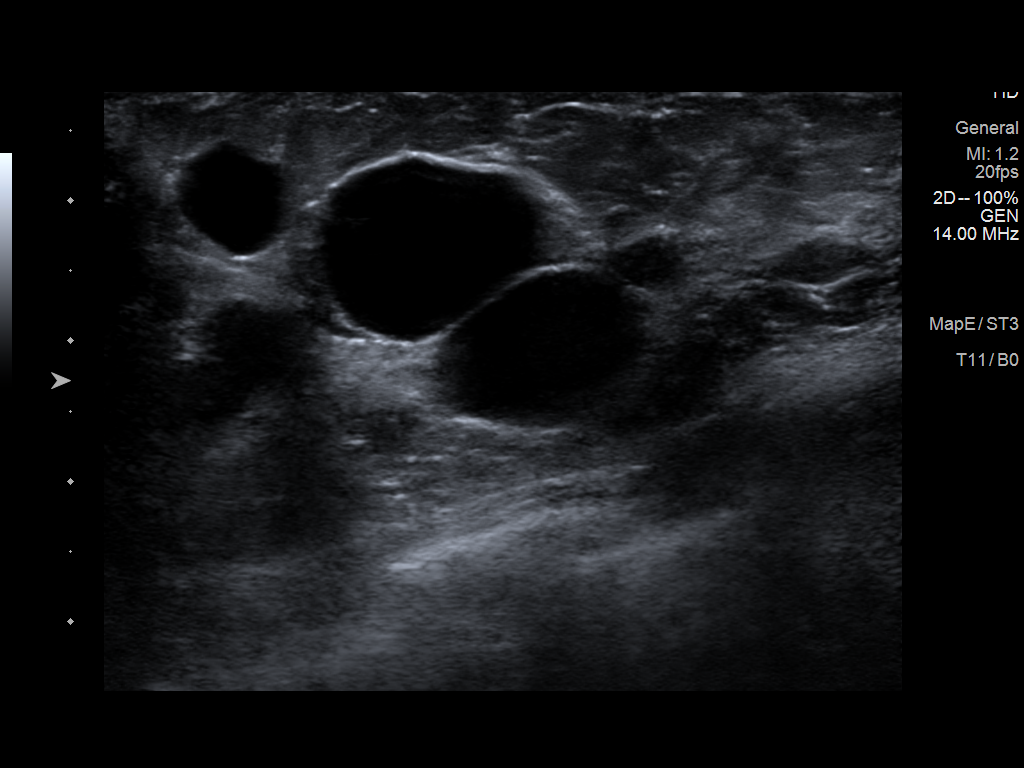
[im 4/5]
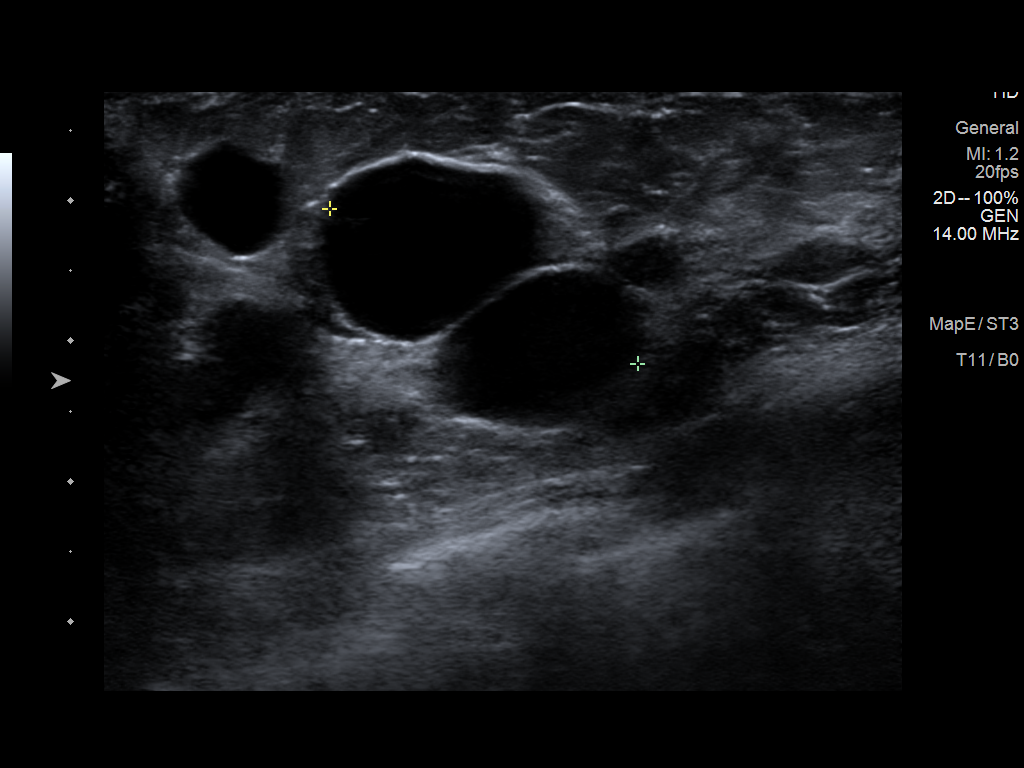
[im 5/5]
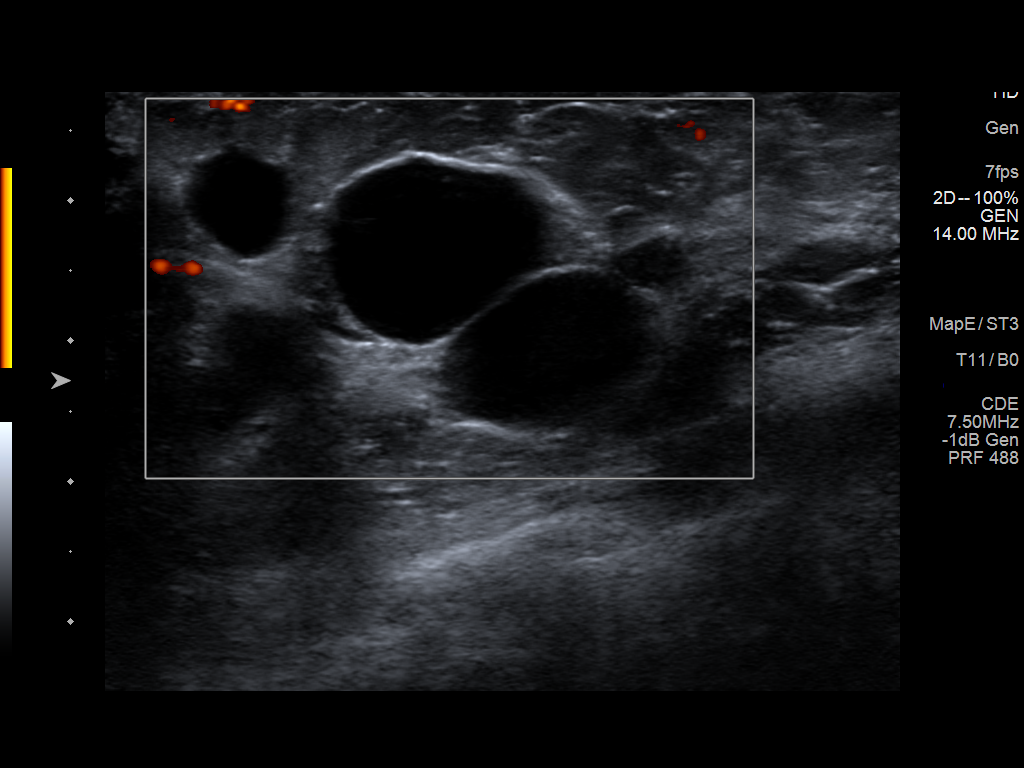

[5 of 5 positions shown; findings below may reference images not displayed]

ACR Breast Density Category c: The breast tissue is heterogeneously
dense, which may obscure small masses.
FINDINGS: Mammogram: At the palpable site of concern in the inferior right
breast there are multiple round and oval circumscribed masses,
largest measuring up to 2.7 cm. No mammographic evidence of
malignancy in the bilateral breasts.

Mammographic images were processed with CAD.

On physical exam, I palpate at least one smooth mobile mass in the
inferior right breast at the palpable site of concern.

Targeted ultrasound is performed, showing multiple round and oval
anechoic circumscribed masses at 6 o'clock 1 cm from the nipple,
corresponding to the palpable site of concern. A group of clustered
masses overall measures 2.5 x 2.5 x 2.1 cm, likely corresponding to
the mass seen on mammogram. No internal blood flow identified.
IMPRESSION: Multiple simple cysts in the right breast at 6 o'clock are benign,
corresponding to the palpable site of concern. No mammographic or
sonographic evidence of malignancy.

RECOMMENDATION:
Screening mammogram in one year.(Code:[12])

Discussed with the patient the possibility of therapeutic aspiration
should the cysts become painful or bothersome. Patient states she
will contact our office at some point in the future should she need
aspiration.

I have discussed the findings and recommendations with the patient.
Results were also provided in writing at the conclusion of the
visit. If applicable, a reminder letter will be sent to the patient
regarding the next appointment.

BI-RADS CATEGORY  2: Benign.

## 2018-10-24 ENCOUNTER — Ambulatory Visit: Payer: Medicare HMO | Admitting: Podiatry

## 2018-10-28 ENCOUNTER — Telehealth: Payer: Self-pay | Admitting: *Deleted

## 2018-10-28 ENCOUNTER — Ambulatory Visit: Payer: Medicare HMO | Admitting: Podiatry

## 2018-10-28 ENCOUNTER — Other Ambulatory Visit: Payer: Self-pay

## 2018-10-28 DIAGNOSIS — B351 Tinea unguium: Secondary | ICD-10-CM | POA: Diagnosis not present

## 2018-10-28 MED ORDER — NONFORMULARY OR COMPOUNDED ITEM: 11 refills | Status: DC

## 2018-10-28 NOTE — Telephone Encounter (Signed)
[employer] [employer] called requested a call back.

## 2018-10-28 NOTE — Telephone Encounter (Signed)
Dr. Jacqualyn Posey asked if itraconazole could be added to antifungal topical and what strength. I spoke with [place] and she states she will research and call again.

## 2018-10-28 NOTE — Telephone Encounter (Signed)
I called [employer] and she states can add Itraconazole 3% to the antifungal topical. Faxed orders to [facility].

## 2018-10-30 NOTE — Progress Notes (Signed)
.  Subjective: 78 y.o. returns the office today for elongated, thickened toenails which she cannot trim herself and to discuss options for nail fungus treatment.  She not seeing much improvement with the fluconazole.  Denies any redness or drainage around the nails. Denies any acute changes since last appointment and no new complaints today. Denies any systemic complaints such as fevers, chills, nausea, vomiting.   PCP: Jonathon Jordan, MD  Objective: AAO 3, NAD DP/PT pulses palpable, CRT less than 3 seconds Nails hypertrophic, dystrophic, elongated, brittle, discolored 10. There is tenderness overlying the nails 1-5 bilaterally. There is no surrounding erythema or drainage along the nail sites. No open lesions or pre-ulcerative lesions are identified. No other areas of tenderness bilateral lower extremities. No overlying edema, erythema, increased warmth. No pain with calf compression, swelling, warmth, erythema.  Assessment: Patient presents with symptomatic onychomycosis  Plan: -Treatment options including alternatives, risks, complications were discussed -Nails sharply debrided 10 without complication/bleeding. -Order compound cream for onychomycosis to include itraconazole. -Discussed daily foot inspection. If there are any changes, to call the office immediately.  -Follow-up in 3 months or sooner if any problems are to arise. In the meantime, encouraged to call the office with any questions, concerns, changes symptoms.  Celesta Gentile, DPM

## 2018-11-27 ENCOUNTER — Encounter: Payer: Self-pay | Admitting: Internal Medicine

## 2018-11-27 ENCOUNTER — Ambulatory Visit: Payer: Medicare HMO | Admitting: Internal Medicine

## 2018-11-27 ENCOUNTER — Other Ambulatory Visit: Payer: Self-pay

## 2018-11-27 DIAGNOSIS — R042 Hemoptysis: Secondary | ICD-10-CM | POA: Diagnosis not present

## 2018-11-27 DIAGNOSIS — J479 Bronchiectasis, uncomplicated: Secondary | ICD-10-CM

## 2018-11-27 MED ORDER — ALBUTEROL SULFATE HFA 108 (90 BASE) MCG/ACT IN AERS: 2.0000 | INHALATION_SPRAY | Freq: Four times a day (QID) | RESPIRATORY_TRACT | 12 refills | Status: AC | PRN

## 2018-11-27 NOTE — Progress Notes (Signed)
Patient ID: [name], female    DOB: February 25, 1940, 78 y.o.   MRN: [id number]  HPI  F never smoker with hx bronchitis/bronchiectasis and chronic recurrent hemoptysis,  + MAIC.. 09/12/12.complicated by sinusitis, Nocardia,  glaucoma, hx sinusistis  Rx: 02/10/13- zith 500 mg TIW, EMB 1200 TIW, Rif 300 mg TIW. Ended by ID. Nocardia Rx'd Bactrim x 1 year Office spirometry 06/29/2014-moderate obstructive airways disease, FVC 2.17/69%, FEV1 1.39/60%, FEV1/FVC 64%, FEF 25-75 percent 0.75/4  ----------------------------------------------------------------------  11/26/2017- 78 year old female never smoker followed for chronic bronchitis/bronchiectasis/ MAIC, chronic recurrent hemoptysis, Glaucoma, positive MAIC 94/50/3888 complicated by Nocardia, history sinusitis Rx: 02/10/13- zith 500 mg TIW, EMB 1200 TIW, Rif 300 mg TIW. Ended by ID. Nocardia Rx'd Bactrim x 1 year -----COPD; Pt states she is currently having wheezing and congestion from Sinus infection-on Omicef BID.  Advair 250, albuterol HFA, Flonase/ Dymista, , Has had 6 of 10 days of Omnicef from her PCP for sinusitis.  Feeling much better.  Mucus is clear.  Still has nasal congestion/sinus pressure sensation. Keeps some cough, usually nonproductive.  We discussed ongoing monitoring of her bronchiectasis/atypical respiratory infection.  She denies fevers or night sweats, purulent or bloody sputum. We reviewed recent CT. CT chest 11/19/17 IMPRESSION: 1. Stable bilateral pulmonary nodularity, including the dominant lesions in the right lower lobe, from comparison study nearly 6 months ago. 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 Consider continued CT follow-up in 6-12 months. 2. No new or enlarging nodules. 3. No acute inflammation or adenopathy. 4. Coronary and Aortic Atherosclerosis (ICD10-I70.0).  11/27/2018- 78 year old  female never smoker followed for chronic bronchitis/bronchiectasis/ MAIC, chronic recurrent hemoptysis, Glaucoma, positive MAIC 28/00/3491 complicated by Nocardia, history sinusitis Rx: 02/10/13- zith 500 mg TIW, EMB 1200 TIW, Rif 300 mg TIW. Ended by ID. Nocardia Rx'd Bactrim x 1 year -----pt states her breathing varies, reports taking SpO2 at home that run in the low 90%s, denies being on home O2 Advair 250, albuterol HFA, Flonase/ Dymista,  Out of her inhalers. Had flu vax. Felt more congested in Spring season. Currently little cough or phlegm, no fever/ sweat.  Discussed last CT. CT chest 07/11/2018-  IMPRESSION: 1. 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 2. Dominant right lower lobe nodule is stable. If additional follow-up is desired, CT chest without contrast in 1 year could be performed. 3. Aortic atherosclerosis (ICD10-170.0). Coronary artery calcification.  Review of Systems-see HPI   + = positive Constitutional:   No-   weight loss, night sweats, fevers, chills, fatigue, lassitude. HEENT:   No-  headaches, difficulty swallowing, tooth/dental problems, sore throat,       No- sneezing, no-itching, ear ache, +congestion, post nasal drip+,  CV:  No-   chest pain, orthopnea, PND, swelling in lower extremities, anasarca, dizziness, palpitations Resp: +  shortness of breath with exertion or at rest.             +productive cough,  + non-productive cough,  No- recent coughing up of blood.               change in color of mucus.  No- wheezing.   Skin: No-   rash or lesions. GI:  No-   heartburn, indigestion, abdominal pain, nausea, vomiting,  GU:  MS:  No-   joint pain or swelling.  . Neuro-     nothing unusual Psych:  No- change in  mood or affect. No depression or anxiety.  No memory loss.  Objective:   Physical Exam General- Alert, Oriented,  Affect-cheerful, Distress- none acute. Trim. Looks well. Skin- rash-none, lesions- none, excoriation- none Lymphadenopathy- none Head- atraumatic            Eyes- Gross vision intact, PERRLA, conjunctivae clear secretions            Ears- Hearing, canals-normal            Nose- + stuffy, no-Septal dev, mucus, polyps, erosion, perforation             Throat- Mallampati II , mucosa clear , drainage- none, tonsils-  atrophic Neck- flexible , trachea midline, no stridor , thyroid nl, carotid no bruit Chest - symmetrical excursion , unlabored           Heart/CV- RRR/ occ extra beat , no murmur , no gallop  , no rub, nl s1 s2                           - JVD- none , edema- none, stasis changes- none, varices- none           Lung- + few crackles L base, unlabored, cough+ slight,  wheeze- none, dullness-none, rub- none           Chest wall-  Abd-  Br/ Gen/ Rectal- Not done, not indicated Extrem- cyanosis- none, clubbing, none, atrophy- none, strength- nl.  Neuro- grossly intact to observation

## 2018-11-27 NOTE — Patient Instructions (Signed)
Refill script sent for albuterol "rescue" inhaler  To use if needed for shortness of breath or congestion- it opens your airways some.  We will decide when we see you next about ordering another CT scan.  Please call if we can help

## 2018-11-30 NOTE — Assessment & Plan Note (Addendum)
Minimal cough and no recent hemoptysis. Plan- refill rescue inhaler. Wait and watch need for Advair since she hasn't been needing it.

## 2018-11-30 NOTE — Assessment & Plan Note (Signed)
Doesn't seem to have active MAIC now- bronchitis component is quiet. We will continue to follow.

## 2018-12-05 DIAGNOSIS — N3000 Acute cystitis without hematuria: Secondary | ICD-10-CM | POA: Diagnosis not present

## 2019-01-27 ENCOUNTER — Ambulatory Visit: Payer: Medicare HMO | Admitting: Podiatry

## 2019-01-27 ENCOUNTER — Other Ambulatory Visit: Payer: Self-pay

## 2019-01-27 DIAGNOSIS — B351 Tinea unguium: Secondary | ICD-10-CM

## 2019-02-02 NOTE — Progress Notes (Signed)
.  Subjective: 78 y.o. returns the office today for elongated, thickened toenails which she cannot trim herself and to discuss options for nail fungus treatment.  She has been using the topical medicine that has been helpful and she feels the nails have been improving.  She notes is a small brown area on the right second digit toenail at the tip but no pain.  This happened about 1 month ago. Denies any systemic complaints such as fevers, chills, nausea, vomiting.   PCP: Jonathon Jordan, MD  Objective: AAO 3, NAD DP/PT pulses palpable, CRT less than 3 seconds Nails hypertrophic, dystrophic, elongated, brittle, discolored 10. There is tenderness overlying the nails 1-5 bilaterally. There is no surrounding erythema or drainage along the nail sites.  Small superficial area of dried blood at the distal aspect the right second toe and upon debridement there is no further dried blood.  No open sores. No open lesions or pre-ulcerative lesions are identified. No other areas of tenderness bilateral lower extremities. No overlying edema, erythema, increased warmth. No pain with calf compression, swelling, warmth, erythema.  Assessment: Patient presents with symptomatic onychomycosis  Plan: -Treatment options including alternatives, risks, complications were discussed -Nails sharply debrided 10 without complication/bleeding. -Continue topical antifungal -Discussed daily foot inspection. If there are any changes, to call the office immediately.  -Follow-up in 3 months or sooner if any problems are to arise. In the meantime, encouraged to call the office with any questions, concerns, changes symptoms.  Celesta Gentile, DPM

## 2019-02-24 DIAGNOSIS — R300 Dysuria: Secondary | ICD-10-CM | POA: Diagnosis not present

## 2019-02-24 DIAGNOSIS — N390 Urinary tract infection, site not specified: Secondary | ICD-10-CM | POA: Diagnosis not present

## 2019-04-23 ENCOUNTER — Ambulatory Visit: Payer: Medicare HMO | Attending: Internal Medicine

## 2019-04-23 DIAGNOSIS — Z23 Encounter for immunization: Secondary | ICD-10-CM | POA: Insufficient documentation

## 2019-04-23 NOTE — Progress Notes (Signed)
   Covid-19 Vaccination Clinic  Name:  [name]    MRN: [id number] DOB: 17-Nov-1940  04/23/2019  [name] was observed post Covid-19 immunization for 15 minutes without incident. She was provided with Vaccine Information Sheet and instruction to access the V-Safe system.   [name] was instructed to call 911 with any severe reactions post vaccine: Marland Kitchen Difficulty breathing  . Swelling of face and throat  . A fast heartbeat  . A bad rash all over body  . Dizziness and weakness   Immunizations Administered    Name Date Dose VIS Date Route   Pfizer COVID-19 Vaccine 04/23/2019 10:05 AM 0.3 mL 01/30/2019 Intramuscular   Manufacturer: [place]   Lot: [id number]   [place]: [id number]

## 2019-04-28 ENCOUNTER — Encounter: Payer: Self-pay | Admitting: Podiatry

## 2019-04-28 ENCOUNTER — Ambulatory Visit: Payer: Medicare HMO | Admitting: Podiatry

## 2019-04-28 ENCOUNTER — Other Ambulatory Visit: Payer: Self-pay

## 2019-04-28 VITALS — Temp 98.2°F

## 2019-04-28 DIAGNOSIS — B351 Tinea unguium: Secondary | ICD-10-CM

## 2019-04-28 NOTE — Progress Notes (Signed)
Subjective: 79 y.o. returns the office today for elongated, thickened toenails which she cannot trim herself. She has been using the topical antifungal and this has been very helpful. She is asking if she needs to continue with this. Denies any systemic complaints such as fevers, chills, nausea, vomiting.   PCP: Abigail Jordan, MD  Objective: AAO 3, NAD DP/PT pulses palpable, CRT less than 3 seconds Nails hypertrophic, dystrophic, elongated, brittle, discolored 10. There is tenderness overlying the nails 1-5 bilaterally. There is no surrounding erythema or drainage along the nail sites.  There is clearing along the proximal nail folds.  No open lesions or pre-ulcerative lesions are identified. No other areas of tenderness bilateral lower extremities. No overlying edema, erythema, increased warmth. No pain with calf compression, swelling, warmth, erythema.  Assessment: Patient presents with symptomatic onychomycosis  Plan: -Treatment options including alternatives, risks, complications were discussed -Nails sharply debrided 10 without complication/bleeding. -Continue topical antifungal- if she needs a refill informed to contact the office.  -Discussed daily foot inspection. If there are any changes, to call the office immediately.  -Follow-up in 3 months or sooner if any problems are to arise. In the meantime, encouraged to call the office with any questions, concerns, changes symptoms.  [name], DPM

## 2019-04-29 DIAGNOSIS — H26492 Other secondary cataract, left eye: Secondary | ICD-10-CM | POA: Diagnosis not present

## 2019-04-29 DIAGNOSIS — H401131 Primary open-angle glaucoma, bilateral, mild stage: Secondary | ICD-10-CM | POA: Diagnosis not present

## 2019-05-14 DIAGNOSIS — H26492 Other secondary cataract, left eye: Secondary | ICD-10-CM | POA: Diagnosis not present

## 2019-05-20 ENCOUNTER — Ambulatory Visit: Payer: Medicare HMO | Attending: Internal Medicine

## 2019-05-20 DIAGNOSIS — Z23 Encounter for immunization: Secondary | ICD-10-CM

## 2019-05-20 NOTE — Progress Notes (Signed)
   Covid-19 Vaccination Clinic  Name:  MEILIN LICURSI    MRN: [id number] DOB: 24-Feb-1940  05/20/2019  Ms. Pesola was observed post Covid-19 immunization for 15 minutes without incident. She was provided with Vaccine Information Sheet and instruction to access the V-Safe system.   Ms. Schwamberger was instructed to call 911 with any severe reactions post vaccine: Marland Kitchen Difficulty breathing  . Swelling of face and throat  . A fast heartbeat  . A bad rash all over body  . Dizziness and weakness   Immunizations Administered    Name Date Dose VIS Date Route   Pfizer COVID-19 Vaccine 05/20/2019 10:12 AM 0.3 mL 01/30/2019 Intramuscular   Manufacturer: [employer], [employer]   Lot: [id number]   [place]: [id number]

## 2019-05-27 DIAGNOSIS — I2510 Atherosclerotic heart disease of native coronary artery without angina pectoris: Secondary | ICD-10-CM | POA: Diagnosis not present

## 2019-05-27 DIAGNOSIS — F331 Major depressive disorder, recurrent, moderate: Secondary | ICD-10-CM | POA: Diagnosis not present

## 2019-05-27 DIAGNOSIS — Z79899 Other long term (current) drug therapy: Secondary | ICD-10-CM | POA: Diagnosis not present

## 2019-05-27 DIAGNOSIS — N3281 Overactive bladder: Secondary | ICD-10-CM | POA: Diagnosis not present

## 2019-05-27 DIAGNOSIS — I700 Atherosclerosis of aorta: Secondary | ICD-10-CM | POA: Diagnosis not present

## 2019-05-27 DIAGNOSIS — Z0000 Encounter for general adult medical examination without abnormal findings: Secondary | ICD-10-CM | POA: Diagnosis not present

## 2019-05-27 DIAGNOSIS — E039 Hypothyroidism, unspecified: Secondary | ICD-10-CM | POA: Diagnosis not present

## 2019-05-27 DIAGNOSIS — E7800 Pure hypercholesterolemia, unspecified: Secondary | ICD-10-CM | POA: Diagnosis not present

## 2019-05-27 DIAGNOSIS — J9611 Chronic respiratory failure with hypoxia: Secondary | ICD-10-CM | POA: Diagnosis not present

## 2019-05-27 DIAGNOSIS — J479 Bronchiectasis, uncomplicated: Secondary | ICD-10-CM | POA: Diagnosis not present

## 2019-05-27 DIAGNOSIS — E559 Vitamin D deficiency, unspecified: Secondary | ICD-10-CM | POA: Diagnosis not present

## 2019-05-27 DIAGNOSIS — I10 Essential (primary) hypertension: Secondary | ICD-10-CM | POA: Diagnosis not present

## 2019-06-10 DIAGNOSIS — R3989 Other symptoms and signs involving the genitourinary system: Secondary | ICD-10-CM | POA: Diagnosis not present

## 2019-06-10 DIAGNOSIS — N3000 Acute cystitis without hematuria: Secondary | ICD-10-CM | POA: Diagnosis not present

## 2019-07-29 DIAGNOSIS — E7800 Pure hypercholesterolemia, unspecified: Secondary | ICD-10-CM | POA: Diagnosis not present

## 2019-07-30 ENCOUNTER — Other Ambulatory Visit: Payer: Self-pay

## 2019-07-30 ENCOUNTER — Ambulatory Visit: Payer: Medicare HMO | Admitting: Podiatry

## 2019-07-30 DIAGNOSIS — B351 Tinea unguium: Secondary | ICD-10-CM | POA: Diagnosis not present

## 2019-07-30 DIAGNOSIS — M79674 Pain in right toe(s): Secondary | ICD-10-CM | POA: Diagnosis not present

## 2019-07-30 DIAGNOSIS — M79675 Pain in left toe(s): Secondary | ICD-10-CM | POA: Diagnosis not present

## 2019-08-03 NOTE — Progress Notes (Signed)
Subjective: 79 y.o. returns the office today for elongated, thickened toenails which she cannot trim herself. She has been continuing with the topical antifungal which she states has been helping quite a bit. Denies any systemic complaints such as fevers, chills, nausea, vomiting.   PCP: Jonathon Jordan, MD  Objective: AAO 3, NAD DP/PT pulses palpable, CRT less than 3 seconds Nails hypertrophic, dystrophic, elongated, brittle, discolored 10. There is tenderness overlying the nails 1-5 bilaterally. There is no surrounding erythema or drainage along the nail sites.  There is clearing along the proximal nail folds.  No open lesions or pre-ulcerative lesions are identified. No other areas of tenderness bilateral lower extremities. No overlying edema, erythema, increased warmth. No pain with calf compression, swelling, warmth, erythema.  Assessment: Patient presents with symptomatic onychomycosis  Plan: -Treatment options including alternatives, risks, complications were discussed -Nails sharply debrided 10 without complication/bleeding- continue topical antifungal that there is some clearing. -Discussed daily foot inspection. If there are any changes, to call the office immediately.  -Follow-up in 3 months or sooner if any problems are to arise. In the meantime, encouraged to call the office with any questions, concerns, changes symptoms.  Celesta Gentile, DPM

## 2019-09-25 ENCOUNTER — Other Ambulatory Visit: Payer: Self-pay | Admitting: Family Medicine

## 2019-09-25 DIAGNOSIS — Z1231 Encounter for screening mammogram for malignant neoplasm of breast: Secondary | ICD-10-CM

## 2019-09-30 ENCOUNTER — Other Ambulatory Visit: Payer: Self-pay | Admitting: Family Medicine

## 2019-09-30 DIAGNOSIS — E2839 Other primary ovarian failure: Secondary | ICD-10-CM

## 2019-10-09 ENCOUNTER — Ambulatory Visit
Admission: RE | Admit: 2019-10-09 | Discharge: 2019-10-09 | Disposition: A | Discharge status: Home / Self Care | Payer: Medicare HMO | Source: Ambulatory Visit | Attending: Family Medicine | Admitting: Family Medicine

## 2019-10-09 ENCOUNTER — Other Ambulatory Visit: Payer: Self-pay

## 2019-10-09 DIAGNOSIS — E2839 Other primary ovarian failure: Secondary | ICD-10-CM

## 2019-10-09 DIAGNOSIS — Z780 Asymptomatic menopausal state: Secondary | ICD-10-CM | POA: Diagnosis not present

## 2019-10-16 ENCOUNTER — Other Ambulatory Visit: Payer: Self-pay

## 2019-10-16 ENCOUNTER — Ambulatory Visit
Admission: RE | Admit: 2019-10-16 | Discharge: 2019-10-16 | Disposition: A | Discharge status: Home / Self Care | Payer: Medicare HMO | Source: Ambulatory Visit | Attending: Family Medicine | Admitting: Family Medicine

## 2019-10-16 DIAGNOSIS — Z1231 Encounter for screening mammogram for malignant neoplasm of breast: Secondary | ICD-10-CM | POA: Diagnosis not present

## 2019-10-16 IMAGING — MG DIGITAL SCREENING BILAT W/ TOMO W/ CAD
8 of 14 series · 9 of 40 positions shown · non-contrast
Comparison: Previous exam(s).

CLINICAL DATA: Screening.

EXAM:
DIGITAL SCREENING BILATERAL MAMMOGRAM WITH TOMO AND CAD

[L MLO synth-2D (1 of 3)]
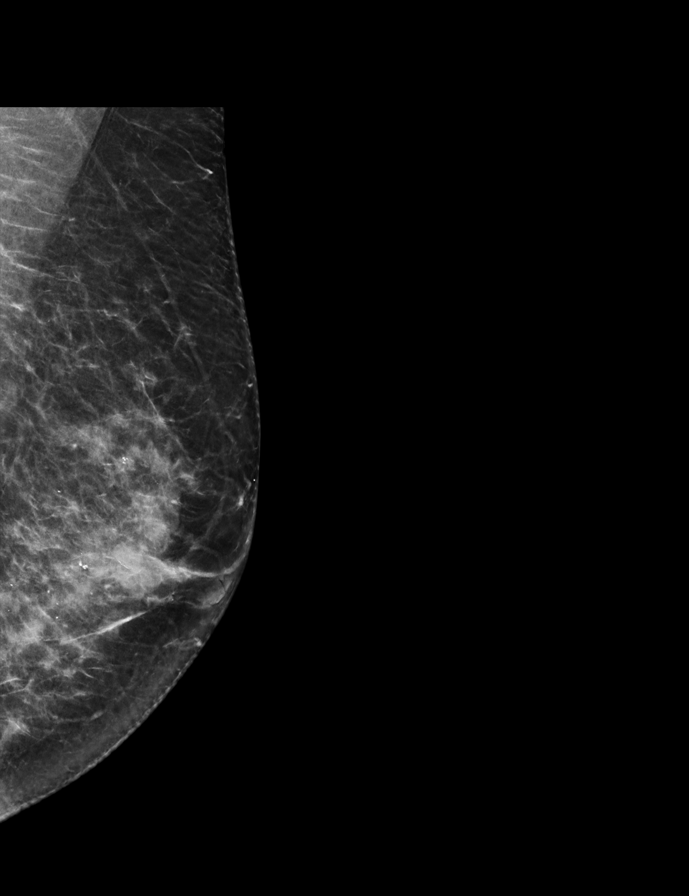

[R MLO synth-2D (1 of 2)]
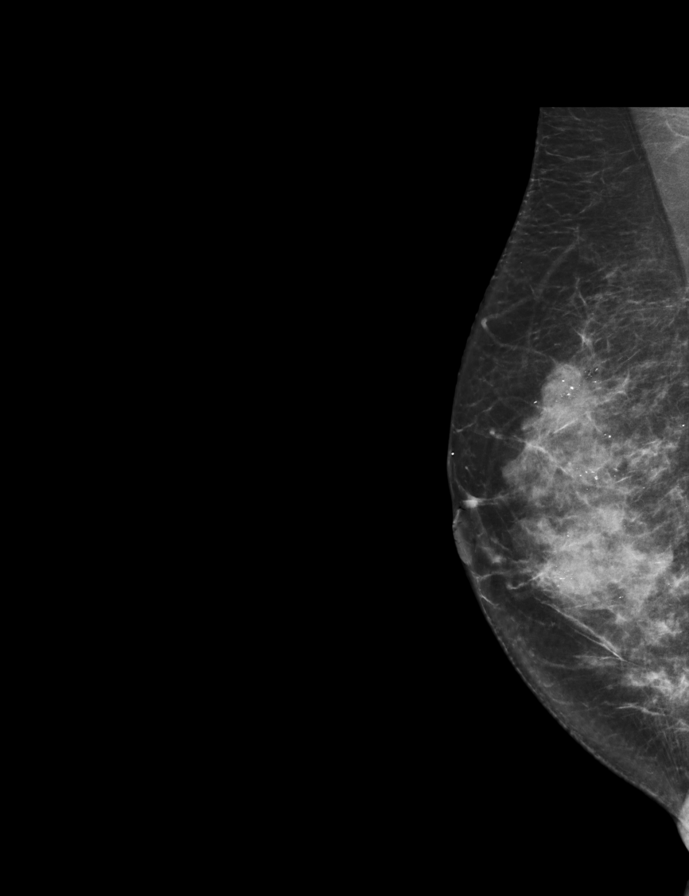

[R CC synth-2D]
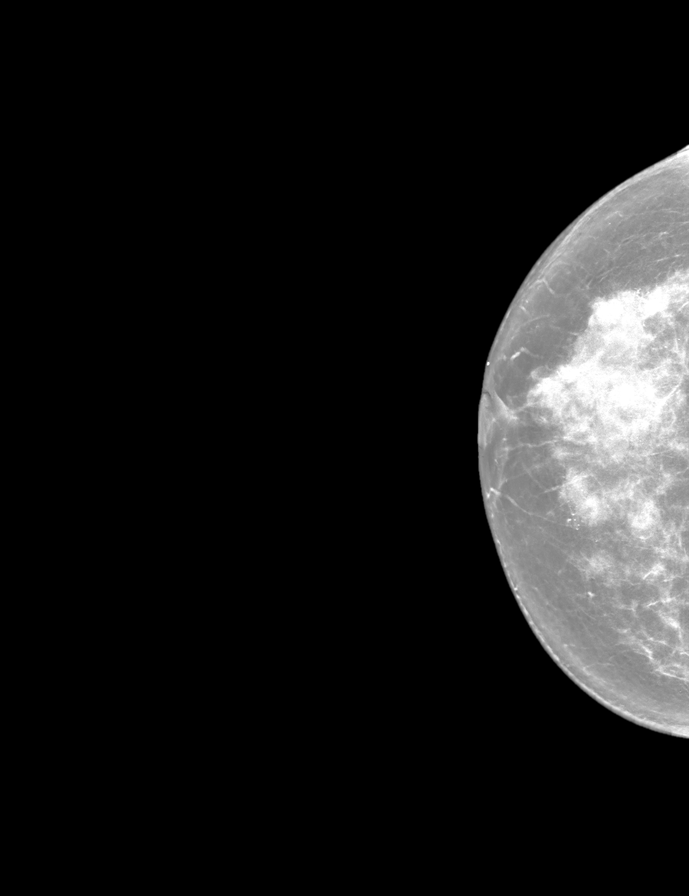

[L MLO synth-2D (2 of 3)]
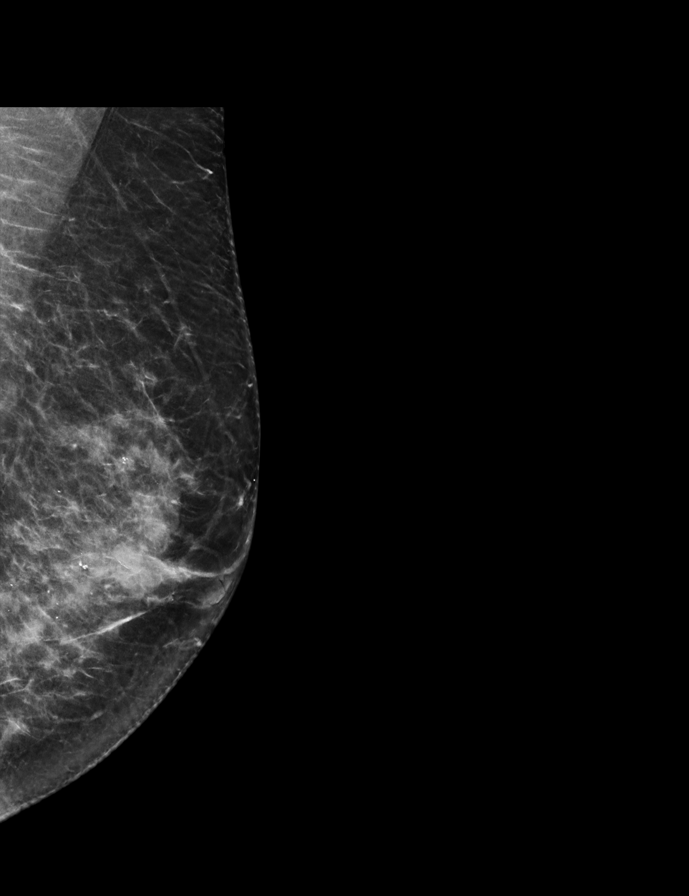

[R MLO synth-2D (2 of 2)]
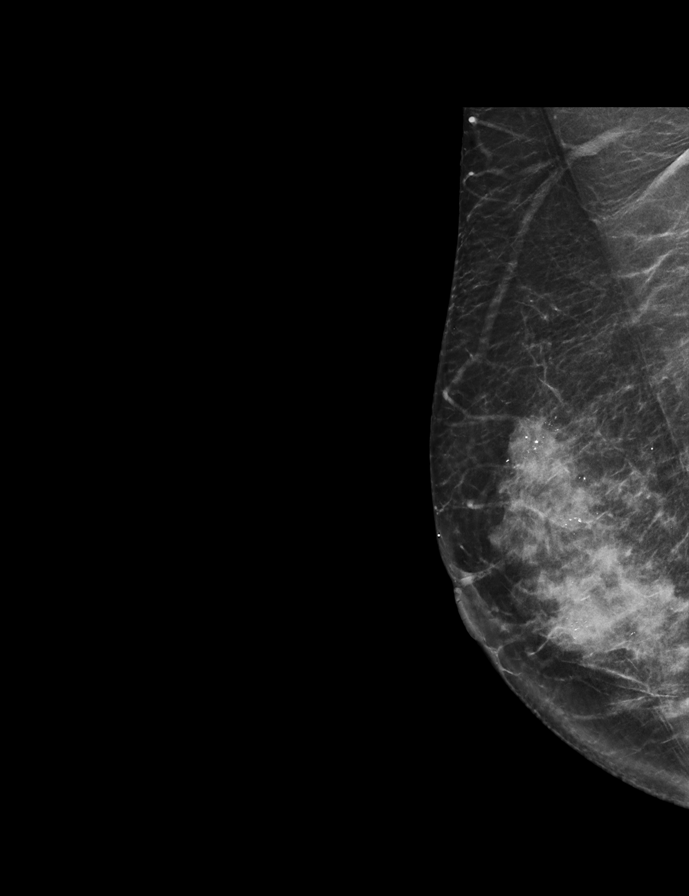

[L CC synth-2D]
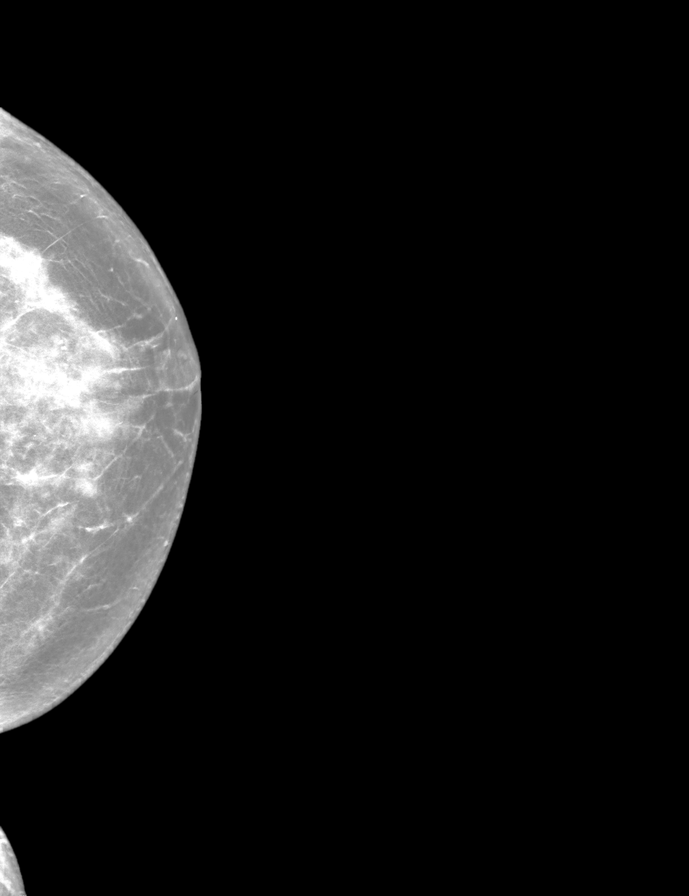

[L MLO synth-2D (3 of 3)]
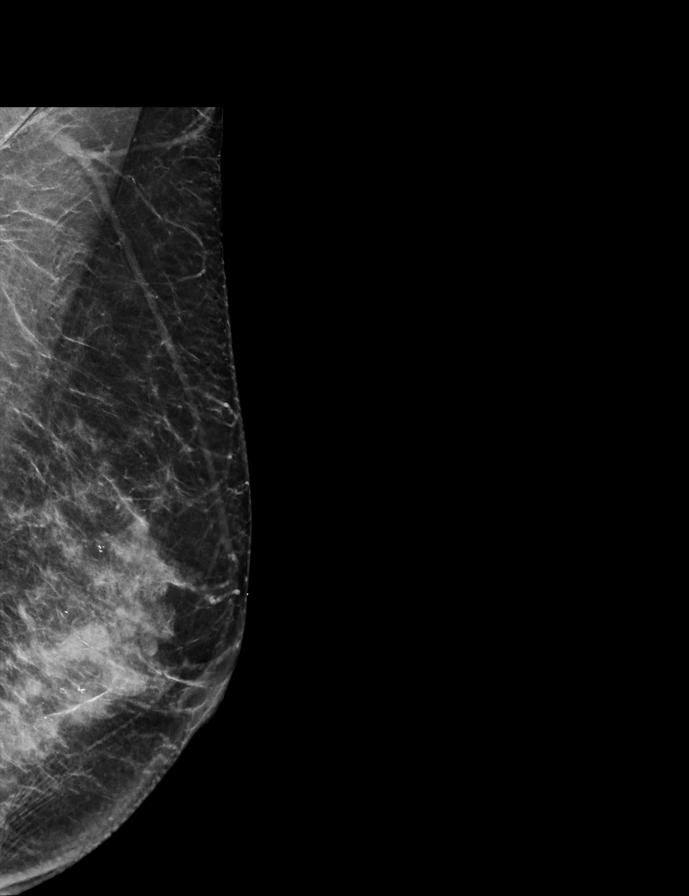

[R MLO tomo · 2 of 71 frames shown]
[frame 23/71]
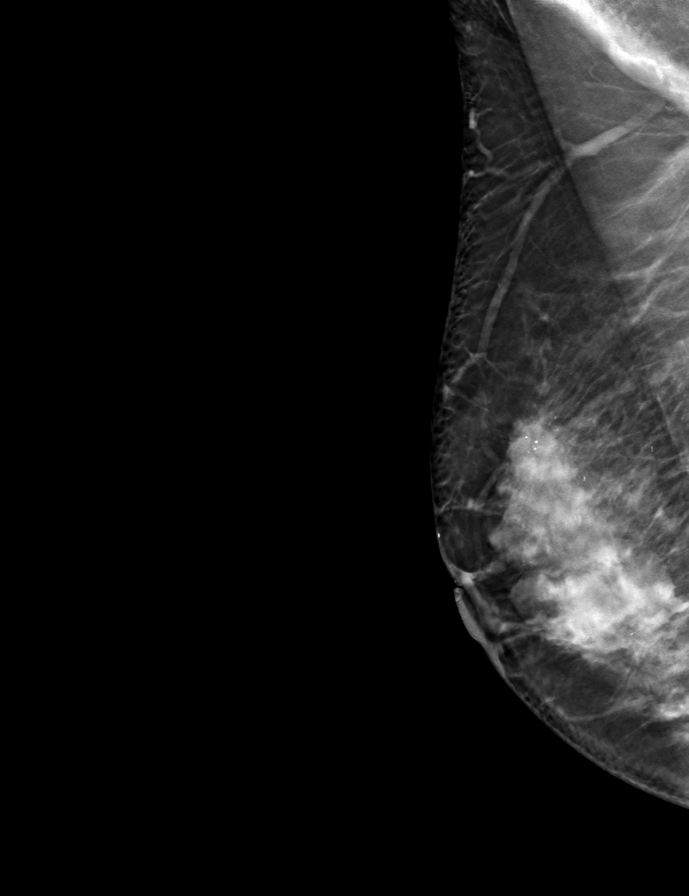
[frame 36/71]
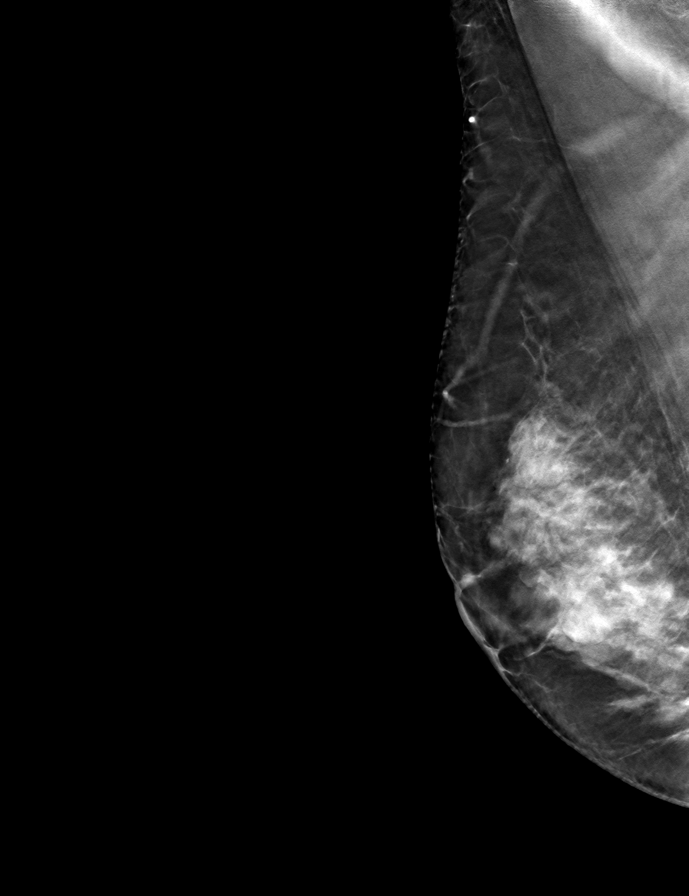

[9 of 40 positions shown; findings below may reference images not displayed]

ACR Breast Density Category c: The breast tissue is heterogeneously
dense, which may obscure small masses.
FINDINGS: In the right breast, a possible mass with calcifications warrants
further evaluation. In the left breast, no findings suspicious for
malignancy. Images were processed with CAD.
IMPRESSION: Further evaluation is suggested for a possible mass with
calcifications in the right breast.

RECOMMENDATION:
Diagnostic mammogram and possibly ultrasound of the right breast.
(Code:[QO])

The patient will be contacted regarding the findings, and additional
imaging will be scheduled.

BI-RADS CATEGORY  0: Incomplete. Need additional imaging evaluation
and/or prior mammograms for comparison.

## 2019-10-20 ENCOUNTER — Other Ambulatory Visit: Payer: Self-pay | Admitting: Family Medicine

## 2019-10-20 DIAGNOSIS — R928 Other abnormal and inconclusive findings on diagnostic imaging of breast: Secondary | ICD-10-CM

## 2019-11-03 ENCOUNTER — Ambulatory Visit
Admission: RE | Admit: 2019-11-03 | Discharge: 2019-11-03 | Disposition: A | Discharge status: Home / Self Care | Payer: Medicare HMO | Source: Ambulatory Visit | Attending: Family Medicine | Admitting: Family Medicine

## 2019-11-03 ENCOUNTER — Ambulatory Visit: Payer: Medicare HMO | Admitting: Podiatry

## 2019-11-03 ENCOUNTER — Other Ambulatory Visit: Payer: Self-pay | Admitting: Family Medicine

## 2019-11-03 ENCOUNTER — Other Ambulatory Visit: Payer: Self-pay

## 2019-11-03 DIAGNOSIS — N6310 Unspecified lump in the right breast, unspecified quadrant: Secondary | ICD-10-CM

## 2019-11-03 DIAGNOSIS — R921 Mammographic calcification found on diagnostic imaging of breast: Secondary | ICD-10-CM | POA: Diagnosis not present

## 2019-11-03 DIAGNOSIS — R928 Other abnormal and inconclusive findings on diagnostic imaging of breast: Secondary | ICD-10-CM

## 2019-11-03 DIAGNOSIS — N6489 Other specified disorders of breast: Secondary | ICD-10-CM | POA: Diagnosis not present

## 2019-11-03 IMAGING — MG MM DIGITAL DIAGNOSTIC UNILAT*R* W/ TOMO W/ CAD
8 series · 8 of 20 positions shown · non-contrast
Comparison: Previous exam(s).

CLINICAL DATA: Patient recalled from screening for right breast
mass and calcifications

EXAM:
DIGITAL DIAGNOSTIC RIGHT MAMMOGRAM WITH CAD AND TOMO
ULTRASOUND RIGHT BREAST

[R ML]
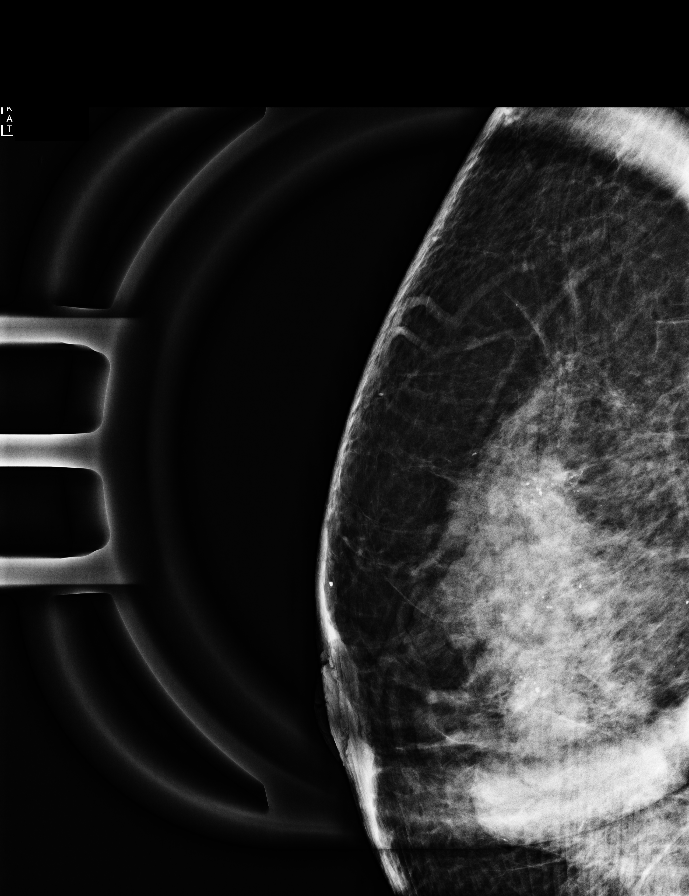

[R CC]
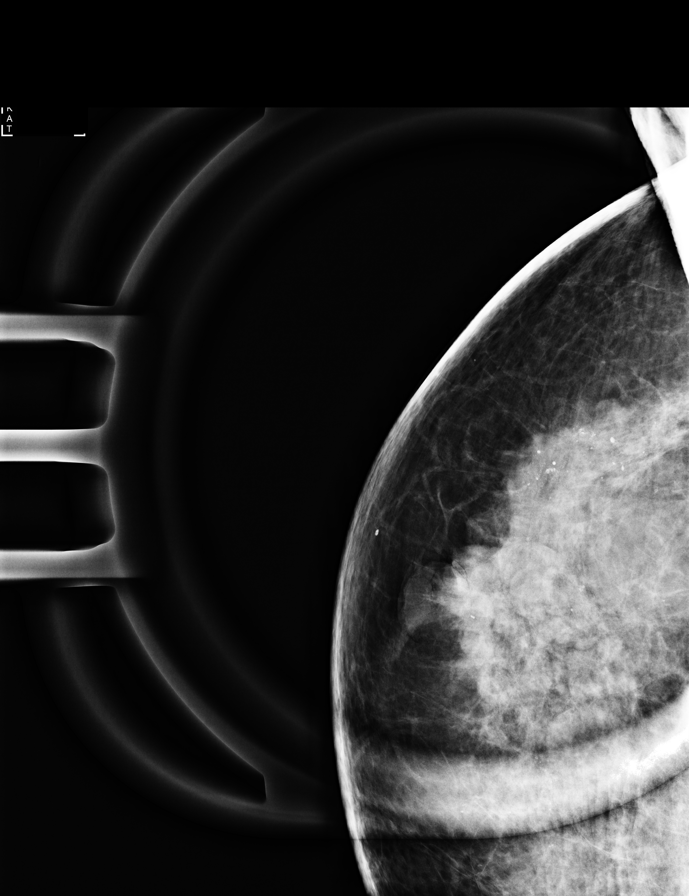

[R MLO synth-2D]
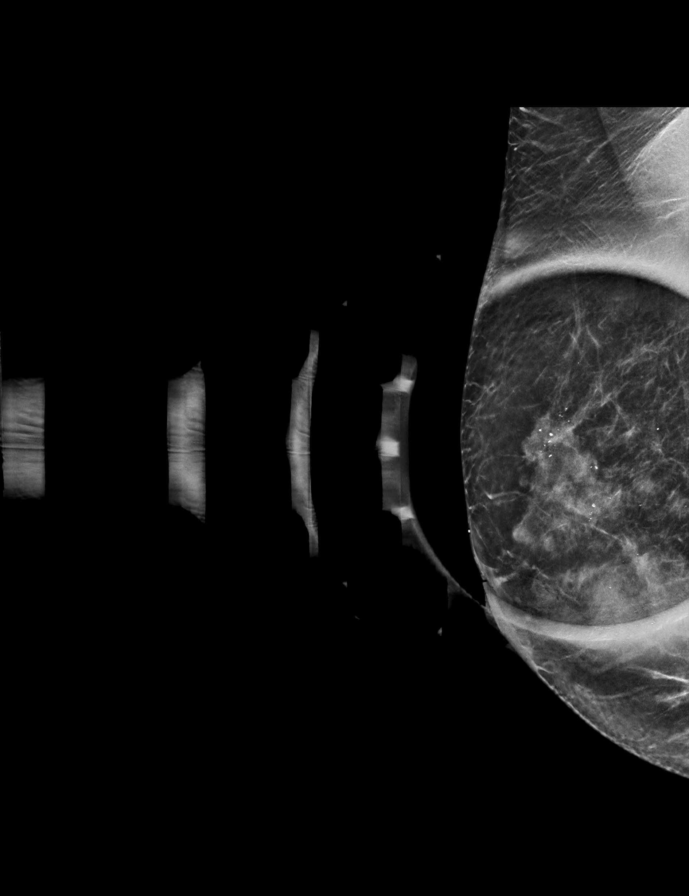

[R ML synth-2D]
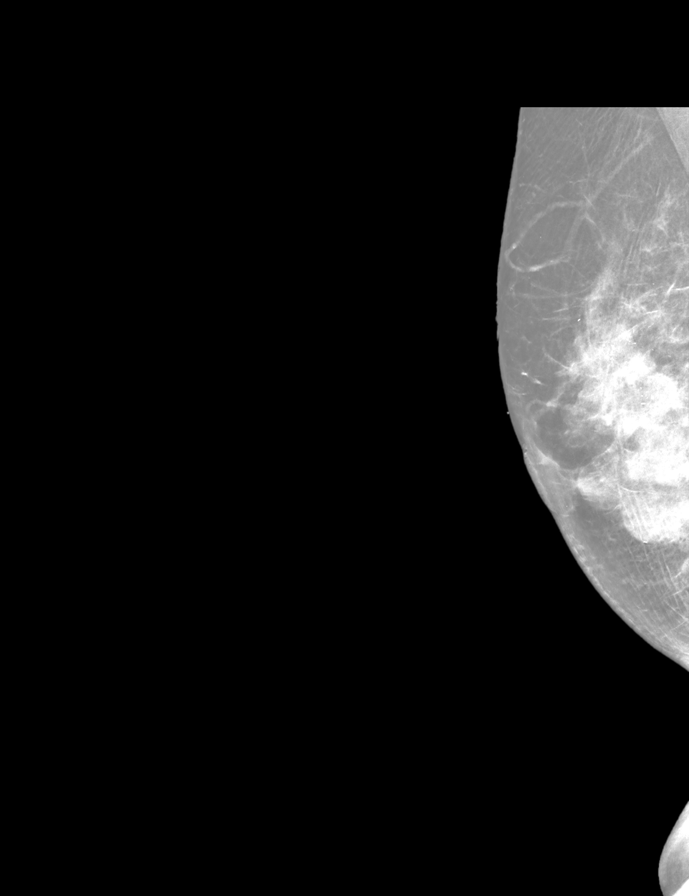

[R CC synth-2D]
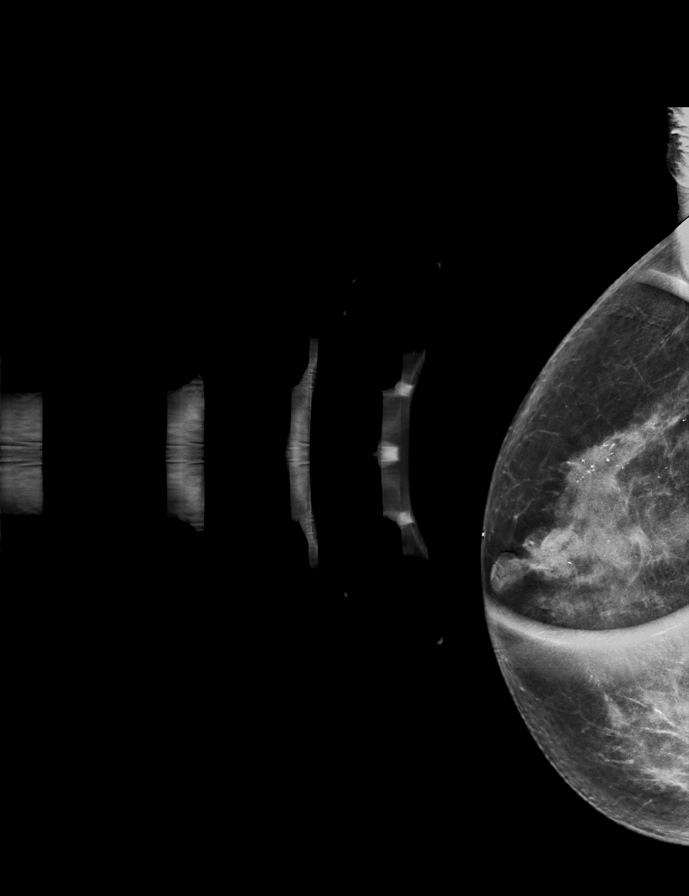

[R MLO tomo · tomo slice 32/63.0]
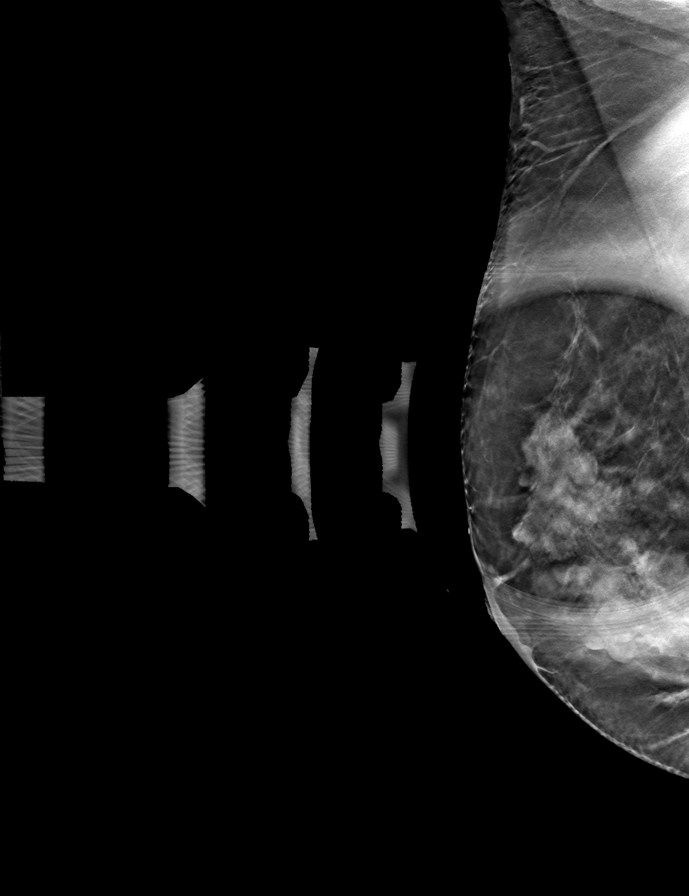

[R ML tomo · tomo slice 36/71.0]
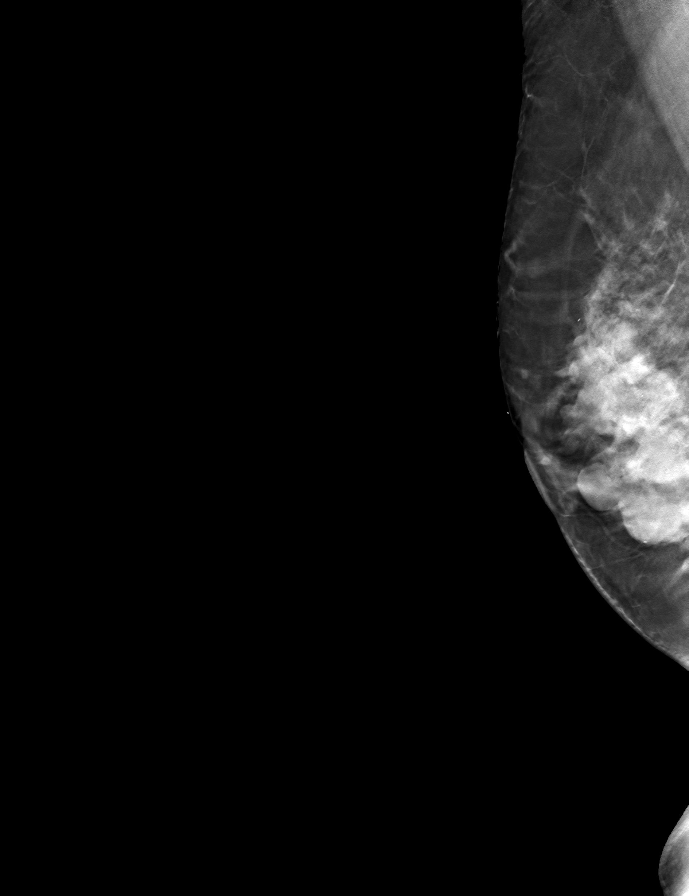

[R CC tomo · tomo slice 33/65.0]
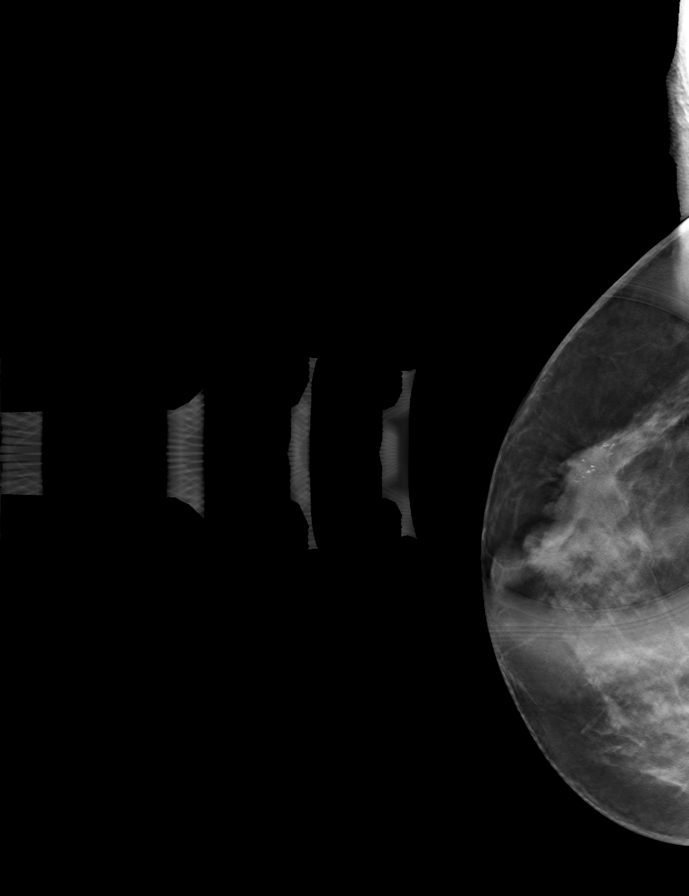

[8 of 20 positions shown; findings below may reference images not displayed]

ACR Breast Density Category d: The breast tissue is extremely dense,
which lowers the sensitivity of mammography.
FINDINGS: With the upper-outer right breast middle depth there is a persistent
irregular mass further evaluated with spot compression CC and MLO
tomosynthesis images. This mass contains approximately 2.2 cm of
coarse heterogeneous calcifications, further evaluated with
magnification views.

Mammographic images were processed with CAD.

Targeted ultrasound is performed, showing a 1.7 x 1.3 x 1.5 cm
irregular hypoechoic mass right breast 10 o'clock position 3 cm from
nipple containing echogenic foci compatible with coarse
heterogeneous calcifications on mammography.

Within the right breast 9:30 o'clock 3 cm from nipple there is a
x 0.7 x 0.9 cm solid and cystic mass.

No right axillary adenopathy.
IMPRESSION: 1. Suspicious irregular solid mass right breast 10 o'clock position.
There are associated coarse heterogeneous calcifications within the
mass identified on mammogram and ultrasound.
2. Indeterminate right breast mass 9:30 o'clock, potentially
representing a complicated cyst.

RECOMMENDATION:
1. Ultrasound-guided core needle biopsy right breast mass 10 o'clock
position.
2. Ultrasound-guided core needle aspiration/biopsy right breast mass
9:30 o'clock.
3. There are associated coarse heterogeneous calcifications with the
mass right breast 10 o'clock position 3 cm from nipple which would
need to be removed at the time of surgical treatment of the
suspected malignancy in the right breast 10 o'clock position.
4. Recommend bilateral breast MRI after completion of the biopsy
procedures given the dense breast and calcifications.

I have discussed the findings and recommendations with the patient.
If applicable, a reminder letter will be sent to the patient
regarding the next appointment.

BI-RADS CATEGORY  5: Highly suggestive of malignancy.

## 2019-11-03 IMAGING — US US BREAST*R* LIMITED INC AXILLA
1 series · 13 of 15 positions shown · non-contrast
Comparison: Previous exam(s).

CLINICAL DATA: Patient recalled from screening for right breast
mass and calcifications

EXAM:
DIGITAL DIAGNOSTIC RIGHT MAMMOGRAM WITH CAD AND TOMO
ULTRASOUND RIGHT BREAST

[Series 1: us breast*right* limited inc axilla · 0.06mm/px · 13 of 15 slices shown]
[im 1/15]
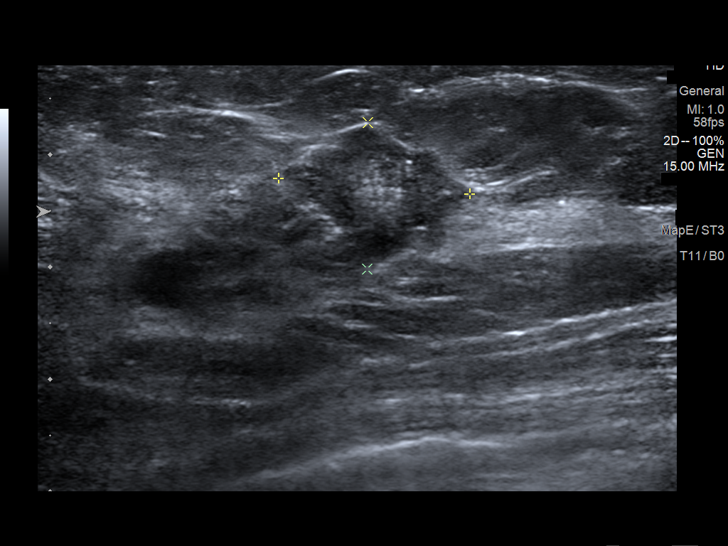
[im 2/15]
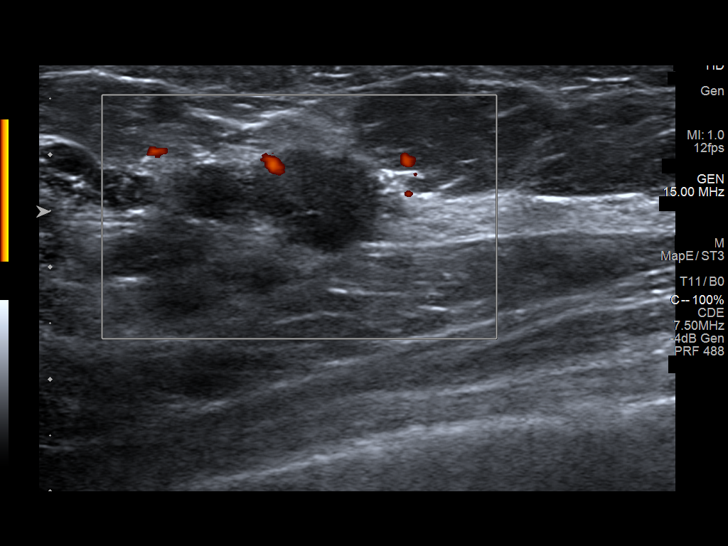
[im 3/15]
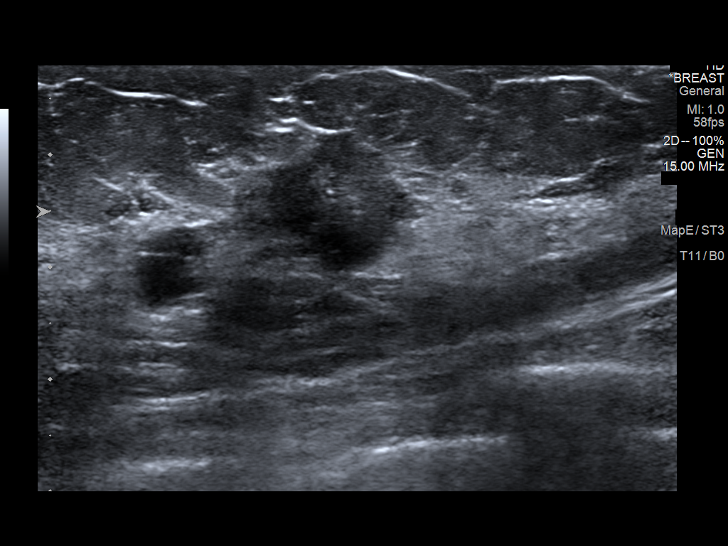
[im 5/15]
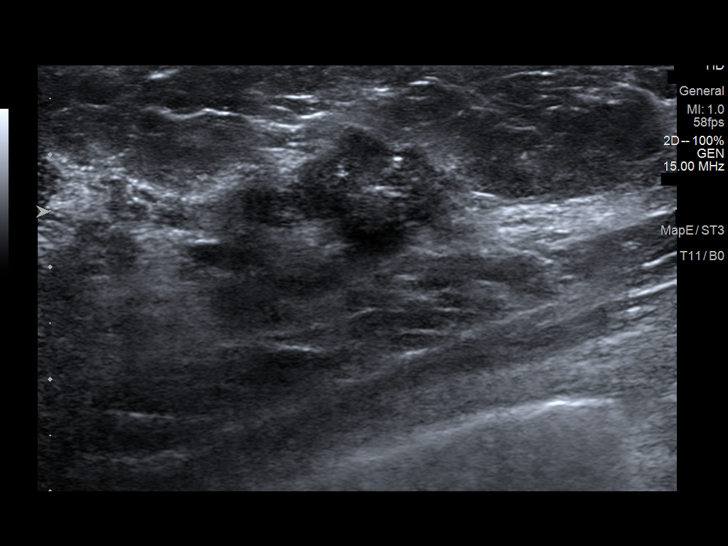
[im 6/15]
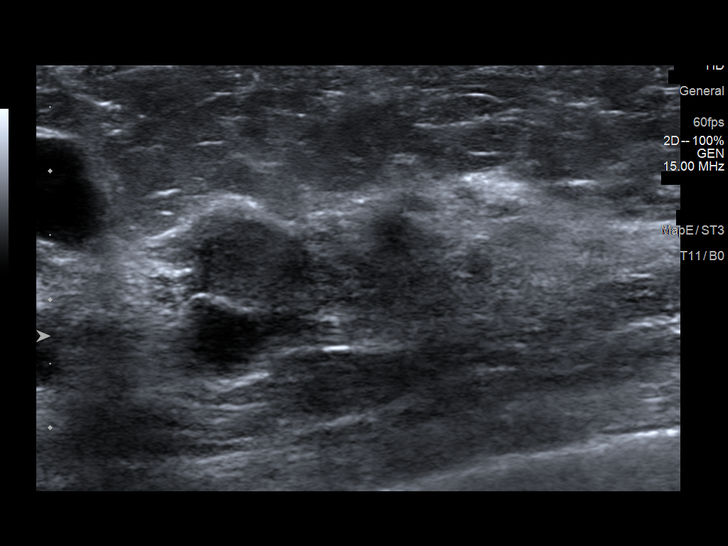
[im 7/15]
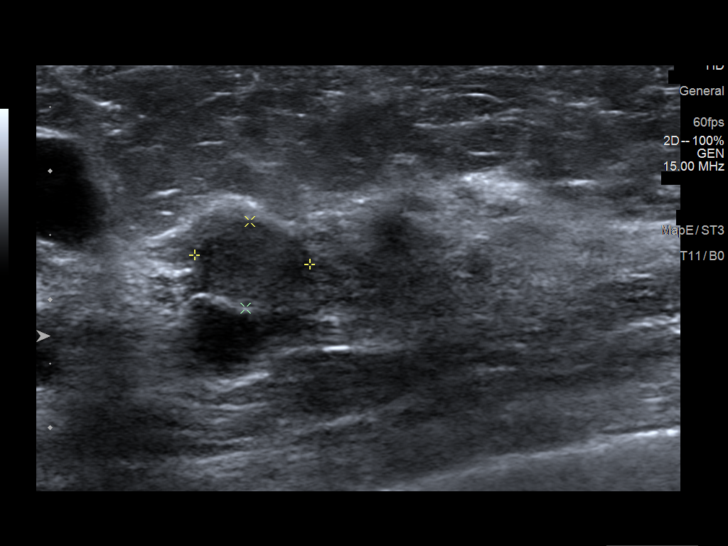
[im 8/15]
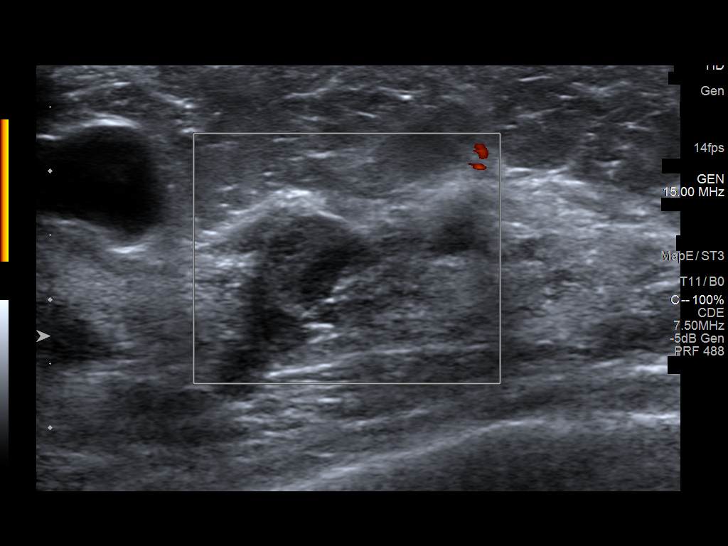
[im 9/15]
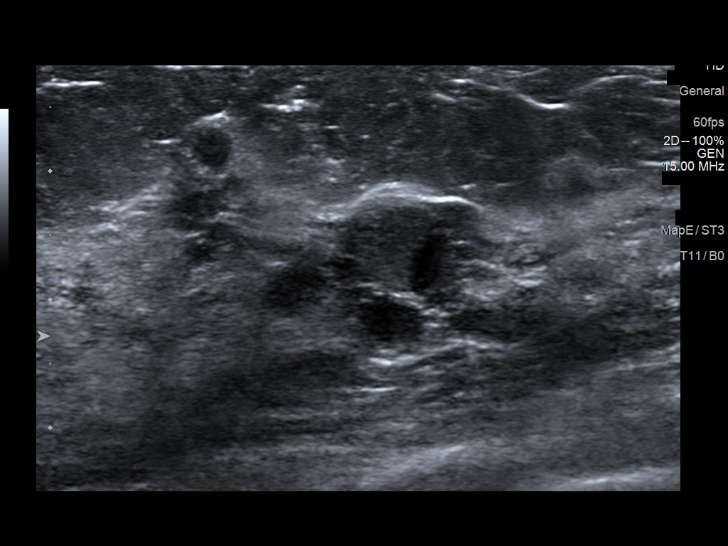
[im 10/15]
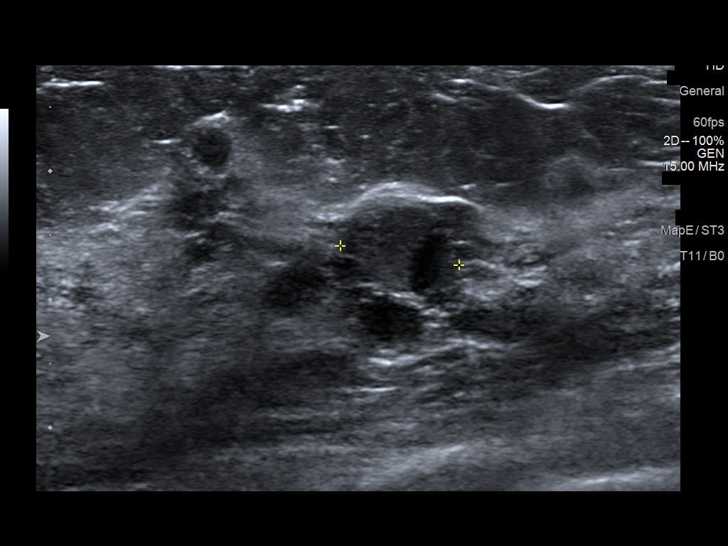
[im 11/15]
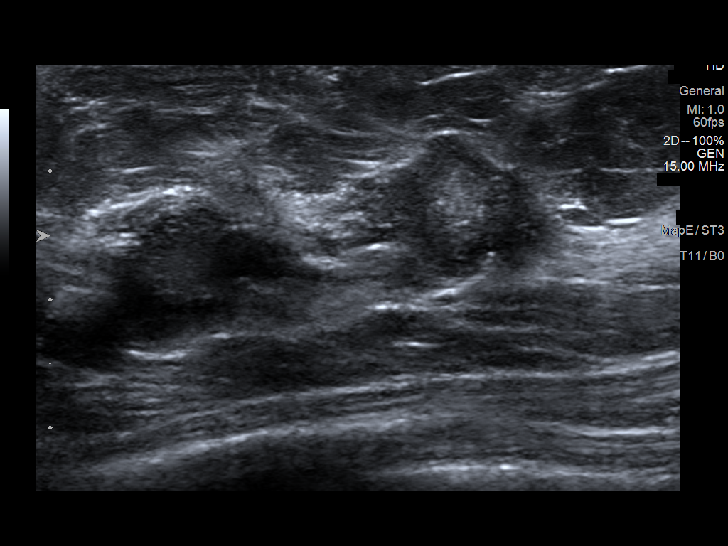
[im 13/15]
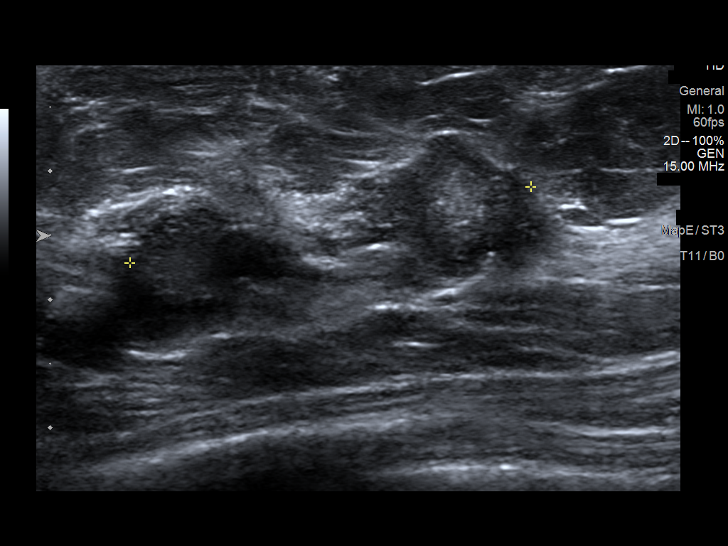
[im 14/15]
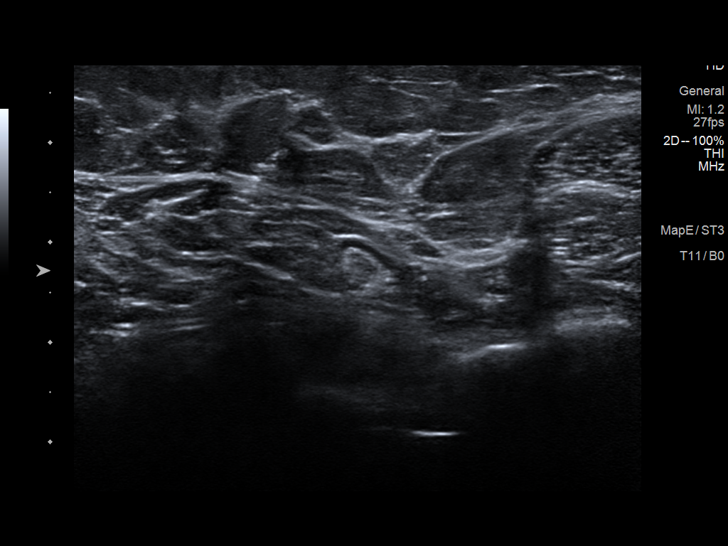
[im 15/15]
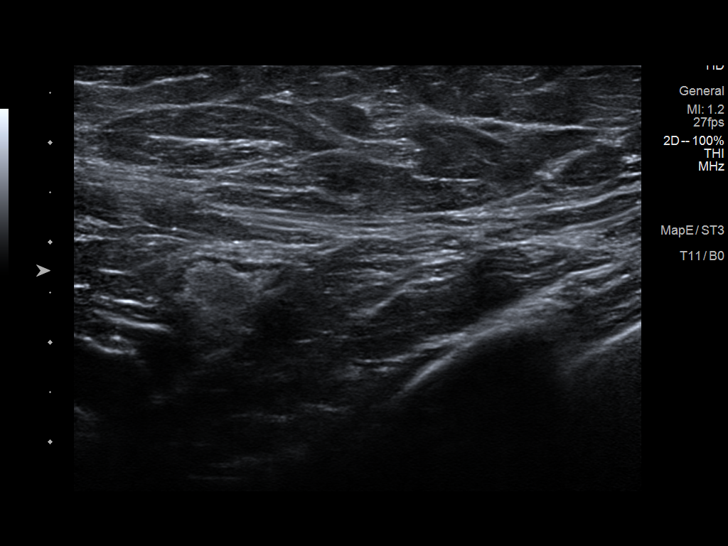

[13 of 15 positions shown; findings below may reference images not displayed]

ACR Breast Density Category d: The breast tissue is extremely dense,
which lowers the sensitivity of mammography.
FINDINGS: With the upper-outer right breast middle depth there is a persistent
irregular mass further evaluated with spot compression CC and MLO
tomosynthesis images. This mass contains approximately 2.2 cm of
coarse heterogeneous calcifications, further evaluated with
magnification views.

Mammographic images were processed with CAD.

Targeted ultrasound is performed, showing a 1.7 x 1.3 x 1.5 cm
irregular hypoechoic mass right breast 10 o'clock position 3 cm from
nipple containing echogenic foci compatible with coarse
heterogeneous calcifications on mammography.

Within the right breast 9:30 o'clock 3 cm from nipple there is a
x 0.7 x 0.9 cm solid and cystic mass.

No right axillary adenopathy.
IMPRESSION: 1. Suspicious irregular solid mass right breast 10 o'clock position.
There are associated coarse heterogeneous calcifications within the
mass identified on mammogram and ultrasound.
2. Indeterminate right breast mass 9:30 o'clock, potentially
representing a complicated cyst.

RECOMMENDATION:
1. Ultrasound-guided core needle biopsy right breast mass 10 o'clock
position.
2. Ultrasound-guided core needle aspiration/biopsy right breast mass
9:30 o'clock.
3. There are associated coarse heterogeneous calcifications with the
mass right breast 10 o'clock position 3 cm from nipple which would
need to be removed at the time of surgical treatment of the
suspected malignancy in the right breast 10 o'clock position.
4. Recommend bilateral breast MRI after completion of the biopsy
procedures given the dense breast and calcifications.

I have discussed the findings and recommendations with the patient.
If applicable, a reminder letter will be sent to the patient
regarding the next appointment.

BI-RADS CATEGORY  5: Highly suggestive of malignancy.

## 2019-11-12 ENCOUNTER — Other Ambulatory Visit: Payer: Self-pay | Admitting: Family Medicine

## 2019-11-12 ENCOUNTER — Ambulatory Visit
Admission: RE | Admit: 2019-11-12 | Discharge: 2019-11-12 | Disposition: A | Discharge status: Home / Self Care | Payer: Medicare HMO | Source: Ambulatory Visit | Attending: Family Medicine | Admitting: Family Medicine

## 2019-11-12 ENCOUNTER — Other Ambulatory Visit: Payer: Self-pay

## 2019-11-12 DIAGNOSIS — C50411 Malignant neoplasm of upper-outer quadrant of right female breast: Secondary | ICD-10-CM | POA: Diagnosis not present

## 2019-11-12 DIAGNOSIS — N6310 Unspecified lump in the right breast, unspecified quadrant: Secondary | ICD-10-CM

## 2019-11-12 DIAGNOSIS — N6311 Unspecified lump in the right breast, upper outer quadrant: Secondary | ICD-10-CM | POA: Diagnosis not present

## 2019-11-12 DIAGNOSIS — N6011 Diffuse cystic mastopathy of right breast: Secondary | ICD-10-CM | POA: Diagnosis not present

## 2019-11-12 IMAGING — US US  BREAST BX W/ LOC DEV 1ST LESION IMG BX SPEC US GUIDE*R*
1 series · 13 of 13 positions shown · non-contrast
Comparison: Previous exam(s).
COMPARISON: Previous exam(s).

Addendum:
CLINICAL DATA: Patient with indeterminate right breast mass 9:30
o'clock and 10 o'clock position.

EXAM:
ULTRASOUND GUIDED RIGHT BREAST CORE NEEDLE BIOPSY

[Series 1: us breast bx w/ loc dev 1st lesion img bx spec us  · 0.07mm/px · 13 of 13 slices shown]
[im 1/13]
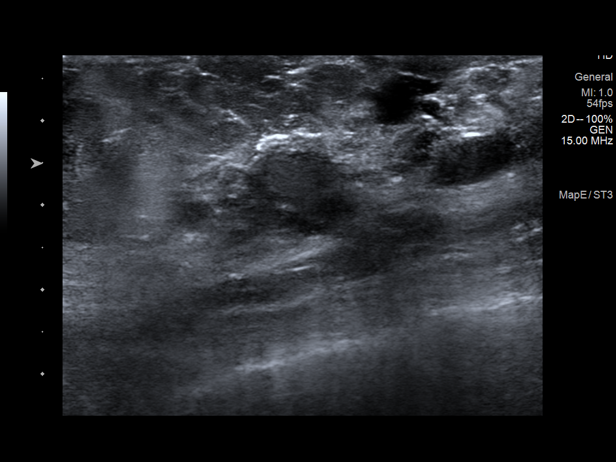
[im 2/13]
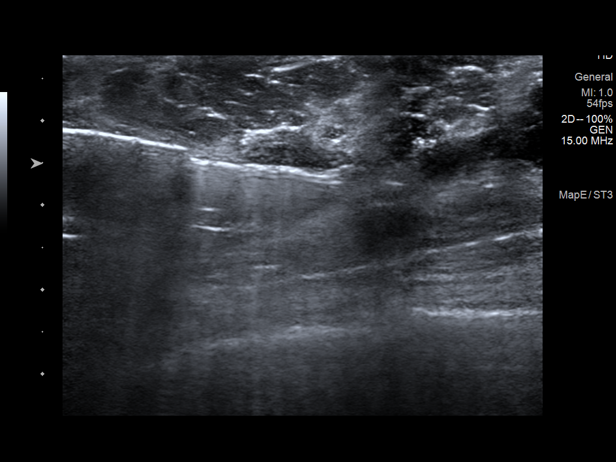
[im 3/13]
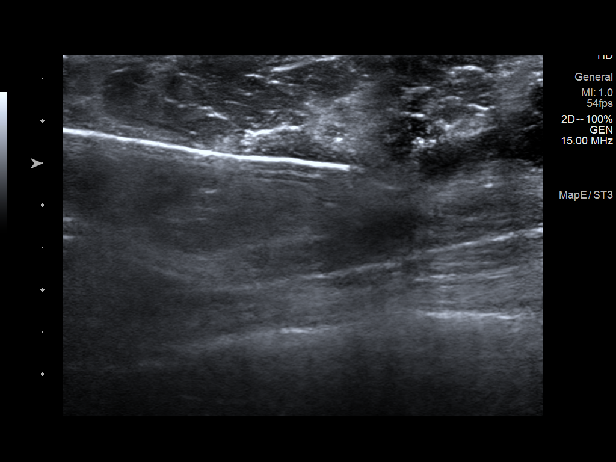
[im 4/13]
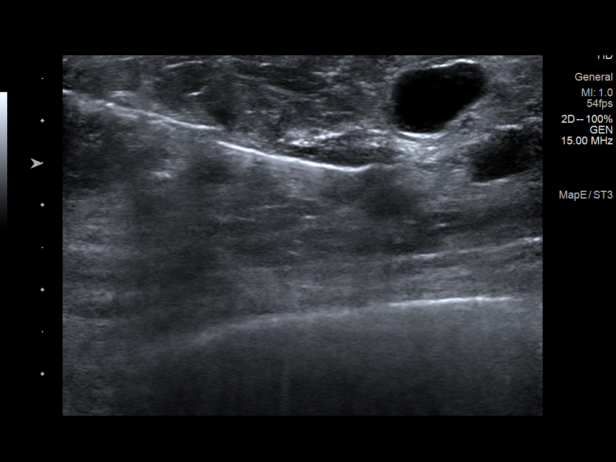
[im 5/13]
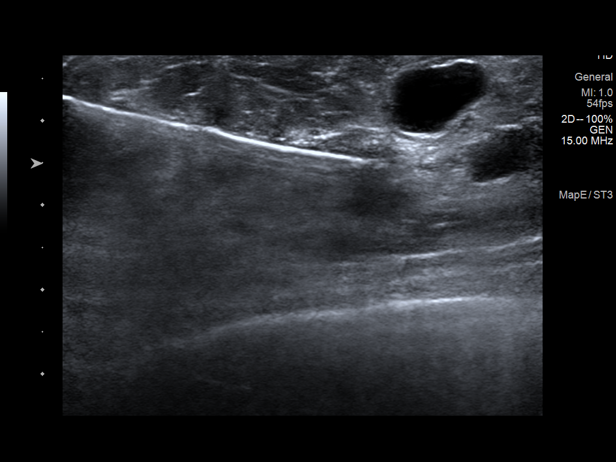
[im 6/13]
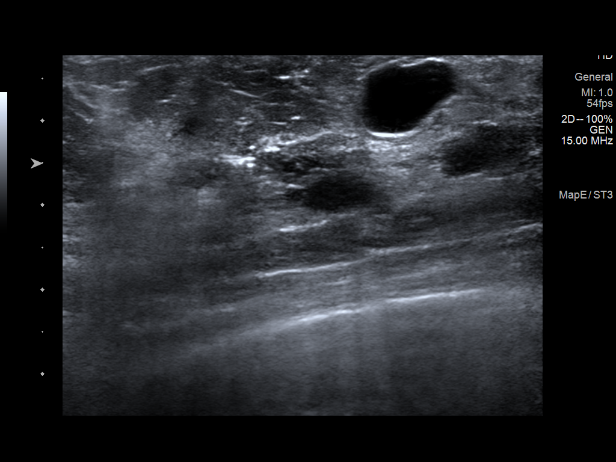
[im 7/13]
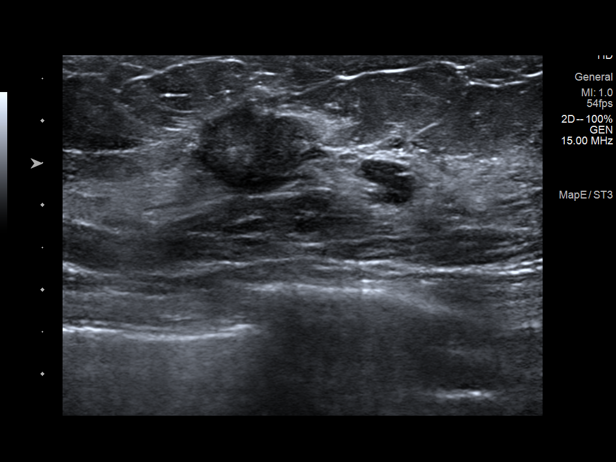
[im 8/13]
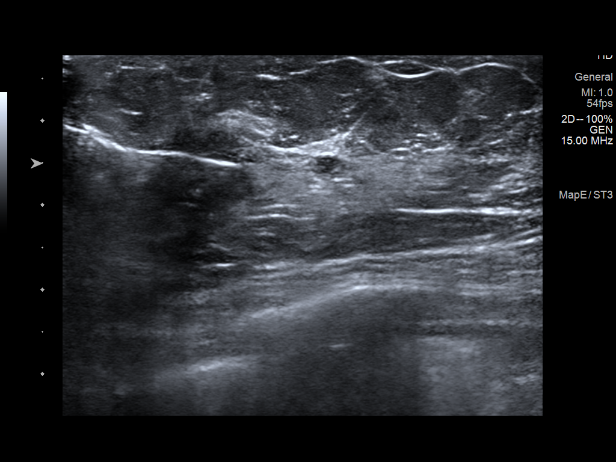
[im 9/13]
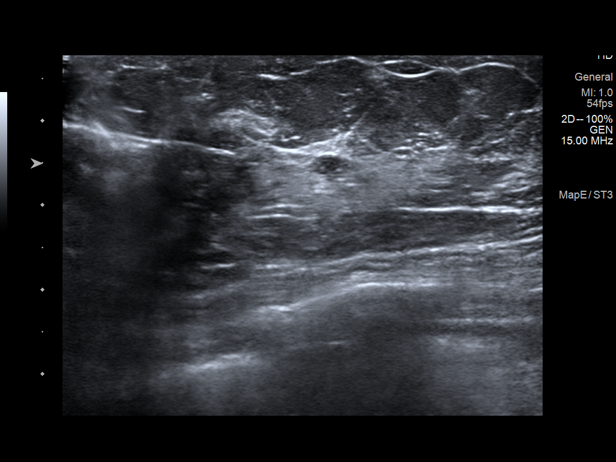
[im 10/13]
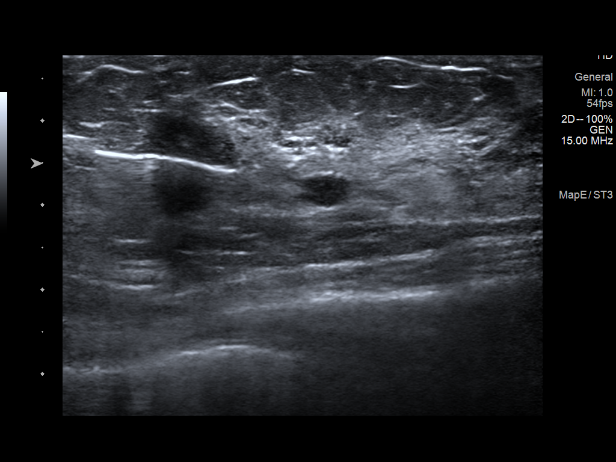
[im 11/13]
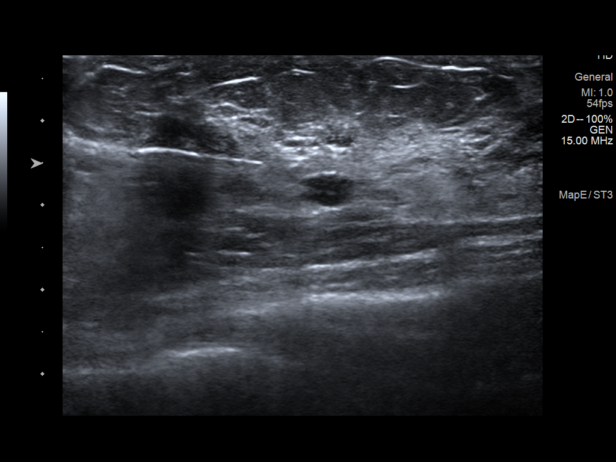
[im 12/13]
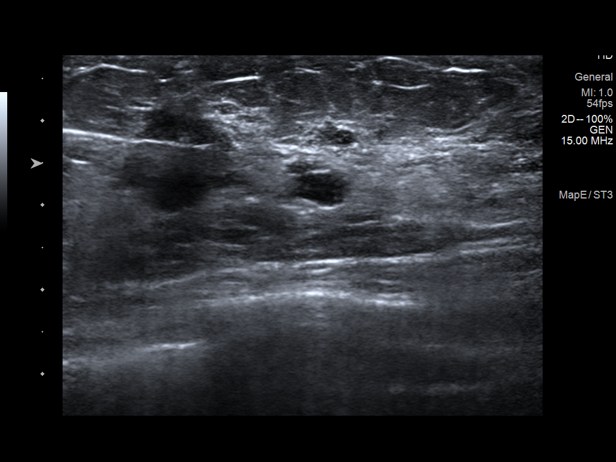
[im 13/13]
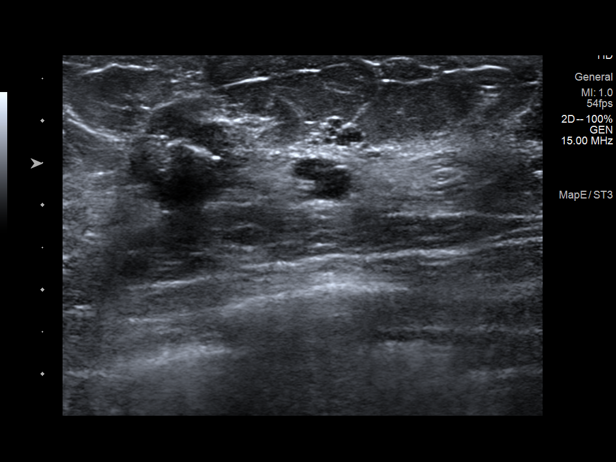

[13 of 13 positions shown; findings below may reference images not displayed]



Site 1: Right breast mass 9:30 o'clock

Lesion quadrant: Upper outer quadrant

Using sterile technique and 1% Lidocaine as local anesthetic, under
direct ultrasound visualization, a 14 gauge BRJUNIN device was
used to perform biopsy of right breast mass 9:30 o'clock using a
lateral approach. At the conclusion of the procedure ribbon shaped
tissue marker clip was deployed into the biopsy cavity. Follow up 2
view mammogram was performed and dictated separately.

Site 2: Right breast mass 10 o'clock

Lesion quadrant: Upper outer quadrant

Using sterile technique and 1% Lidocaine as local anesthetic, under
direct ultrasound visualization, a 14 gauge BRJUNIN device was
used to perform biopsy of right breast mass 9:30 o'clock using a
lateral approach. At the conclusion of the procedure coil shaped
tissue marker clip was deployed into the biopsy cavity. Follow up 2
view mammogram was performed and dictated separately.
IMPRESSION: Ultrasound guided biopsy of right breast mass 9:30 o'clock and 10
o'clock position. No apparent complications.

ADDENDUM:
PATHOLOGY revealed: Site 1. Breast, RIGHT, needle core biopsy, 9:30
o'clock - FIBROCYSTIC CHANGE. - PSEUDOANGIOMATOUS STROMAL
HYPERPLASIA (PASH).- NO MALIGNANCY IDENTIFIED.

Pathology results are CONCORDANT with imaging findings, per Dr. BRJUNIN
BRJUNIN.

PATHOLOGY revealed: Site 2. Breast, RIGHT, needle core biopsy, 10
o'clock- INVASIVE DUCTAL CARCINOMA, SEE COMMENT. Microscopic Comment
2. The carcinoma appears grade 3 and measures 12 mm in greatest
linear extent. Prognostic makers will be ordered.

Pathology results are CONCORDANT with imaging findings, per Dr. BRJUNIN
BRJUNIN.

Pathology results and recommendations below were discussed with
reported biopsy site within normal limits with slight tenderness at
the site. Post biopsy care instructions were reviewed, questions
were answered and my direct phone number was provided to patient.
Patient was instructed to call [REDACTED]
if any concerns or questions arise related to the biopsy.

Recommendations:

1. Patient was referred to [REDACTED]
[REDACTED] at [REDACTED] [REDACTED], with
appointment on [DATE].

2. There are associated coarse heterogeneous calcifications with the
RIGHT breast mass at 10 o'clock position (3 cm from nipple) which
would need excision at time of surgical treatment of the suspected
malignancy in the RIGHT breast, 10 o'clock position.

3. Recommend bilateral breast MRI after completion of biopsy
procedures given dense breasts and calcifications.

Pathology results reported by BRJUNIN RN on [DATE].



Site 1: Right breast mass 9:30 o'clock

Lesion quadrant: Upper outer quadrant

Using sterile technique and 1% Lidocaine as local anesthetic, under
direct ultrasound visualization, a 14 gauge BRJUNIN device was
used to perform biopsy of right breast mass 9:30 o'clock using a
lateral approach. At the conclusion of the procedure ribbon shaped
tissue marker clip was deployed into the biopsy cavity. Follow up 2
view mammogram was performed and dictated separately.

Site 2: Right breast mass 10 o'clock

Lesion quadrant: Upper outer quadrant

Using sterile technique and 1% Lidocaine as local anesthetic, under
direct ultrasound visualization, a 14 gauge BRJUNIN device was
used to perform biopsy of right breast mass 9:30 o'clock using a
lateral approach. At the conclusion of the procedure coil shaped
tissue marker clip was deployed into the biopsy cavity. Follow up 2
view mammogram was performed and dictated separately.
IMPRESSION: Ultrasound guided biopsy of right breast mass 9:30 o'clock and 10
o'clock position. No apparent complications.

## 2019-11-12 IMAGING — MG MM BREAST LOCALIZATION CLIP
4 series · 4 of 12 positions shown · non-contrast
Comparison: Previous exam(s).

CLINICAL DATA: Patient status post ultrasound-guided core needle
biopsy right breast masses.

EXAM:
DIAGNOSTIC RIGHT MAMMOGRAM POST ULTRASOUND BIOPSY

[R ML synth-2D]
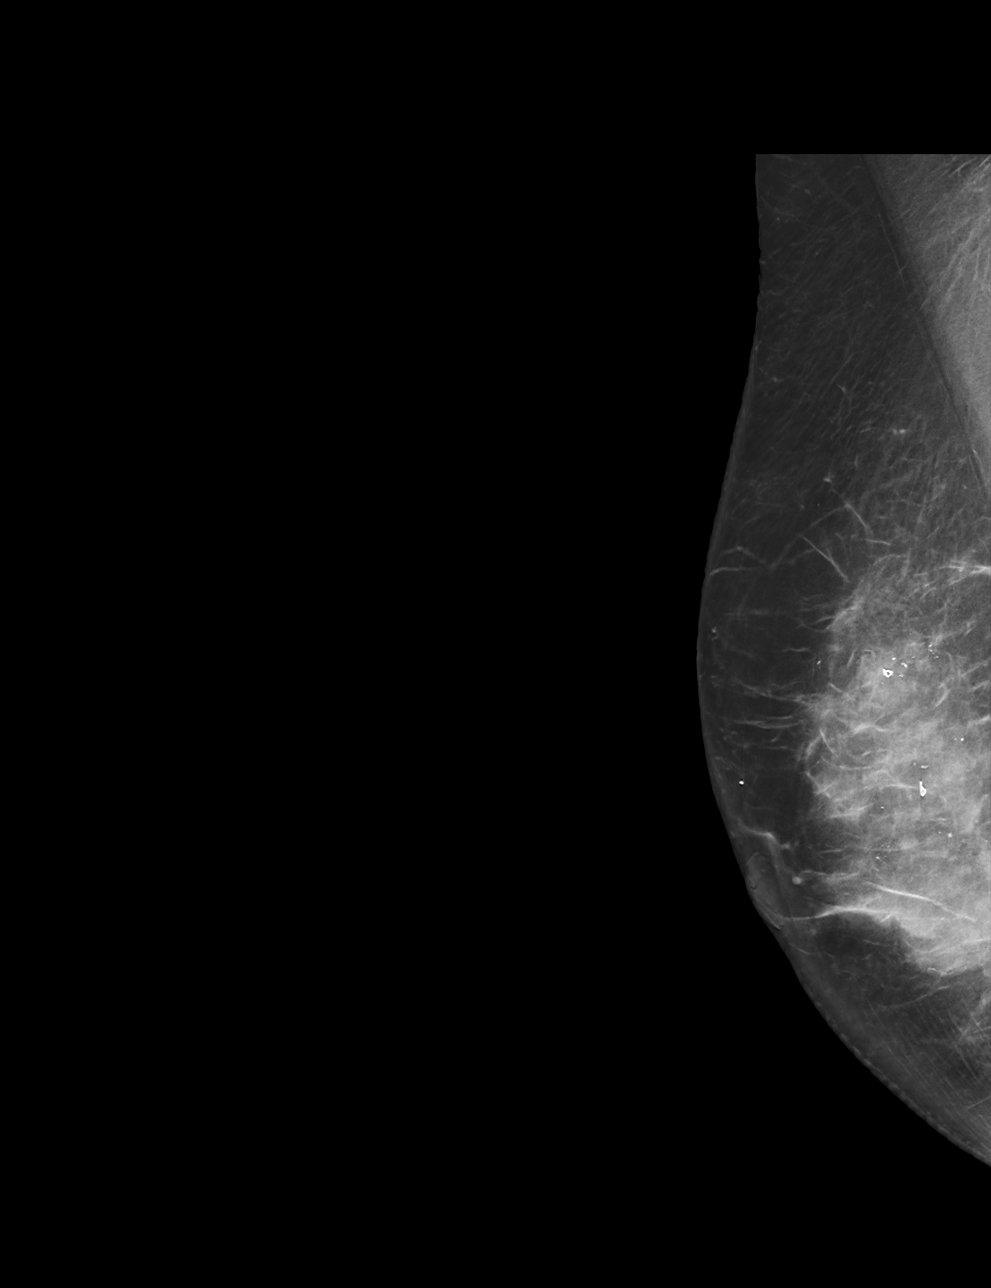

[R CC synth-2D]
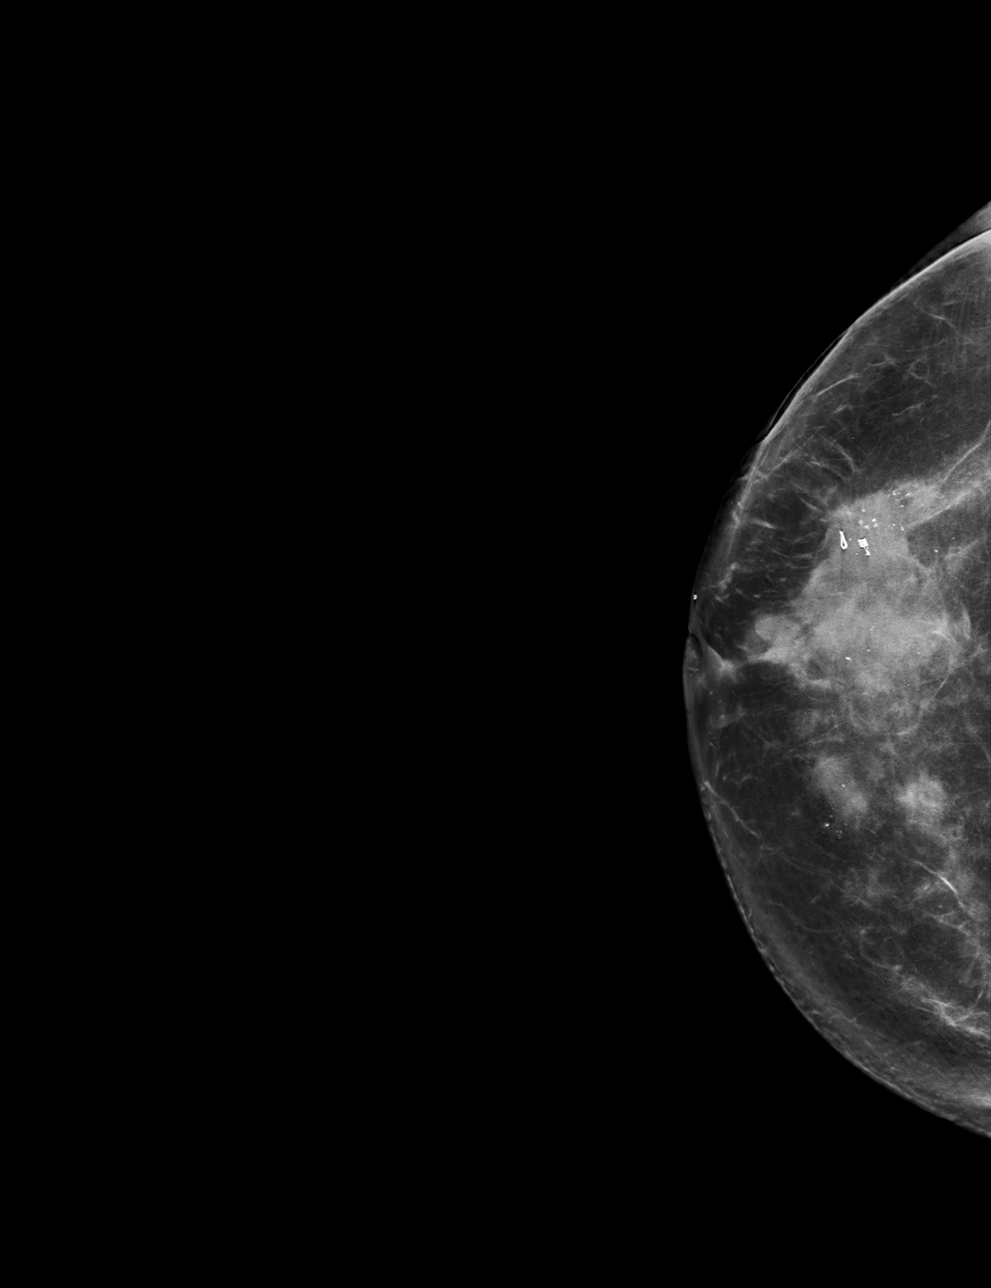

[R ML tomo · tomo slice 39/77.0]
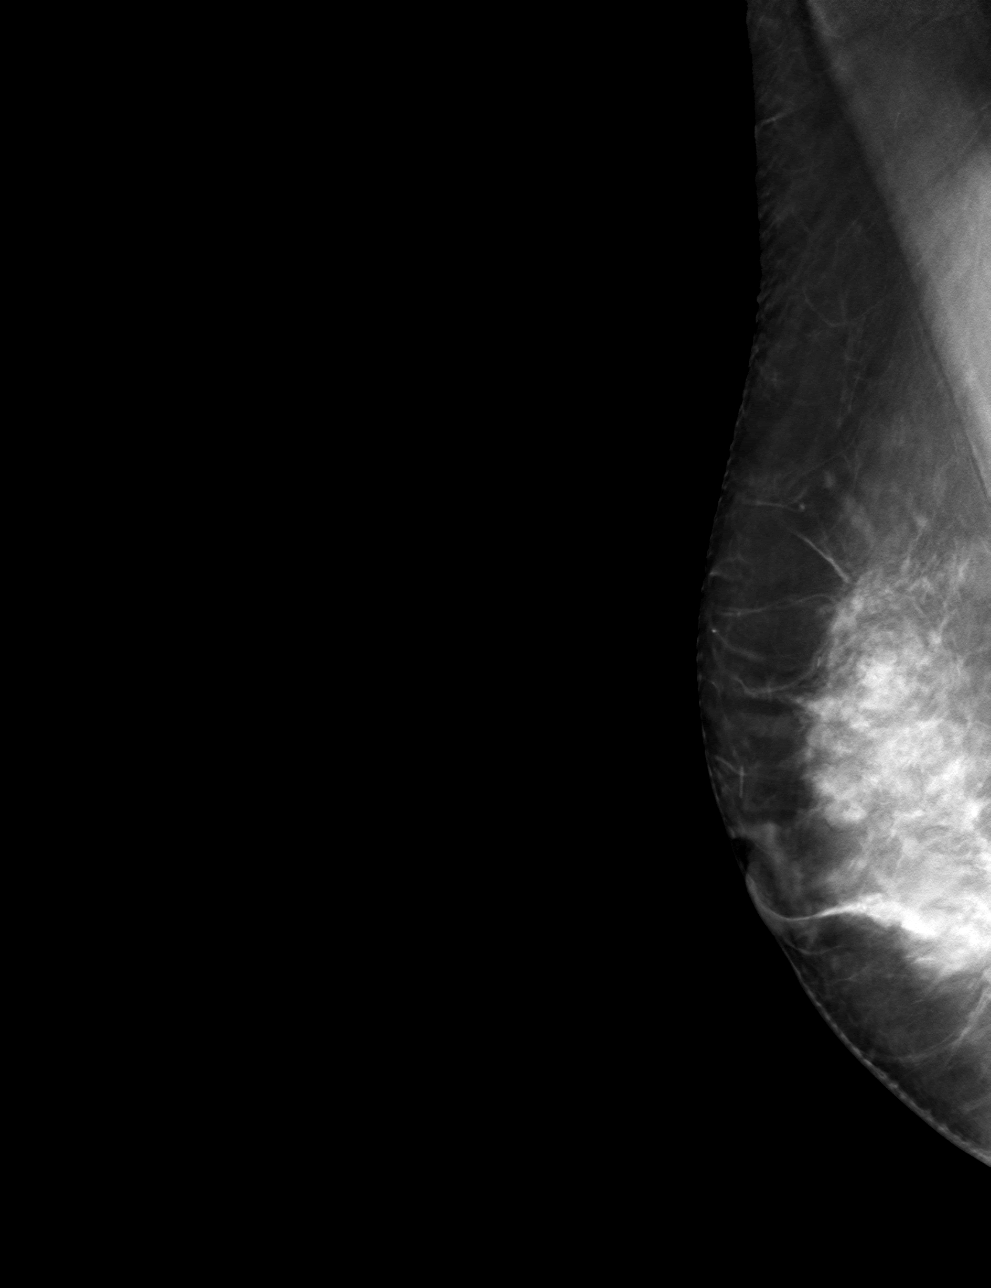

[R CC tomo · tomo slice 39/78.0]
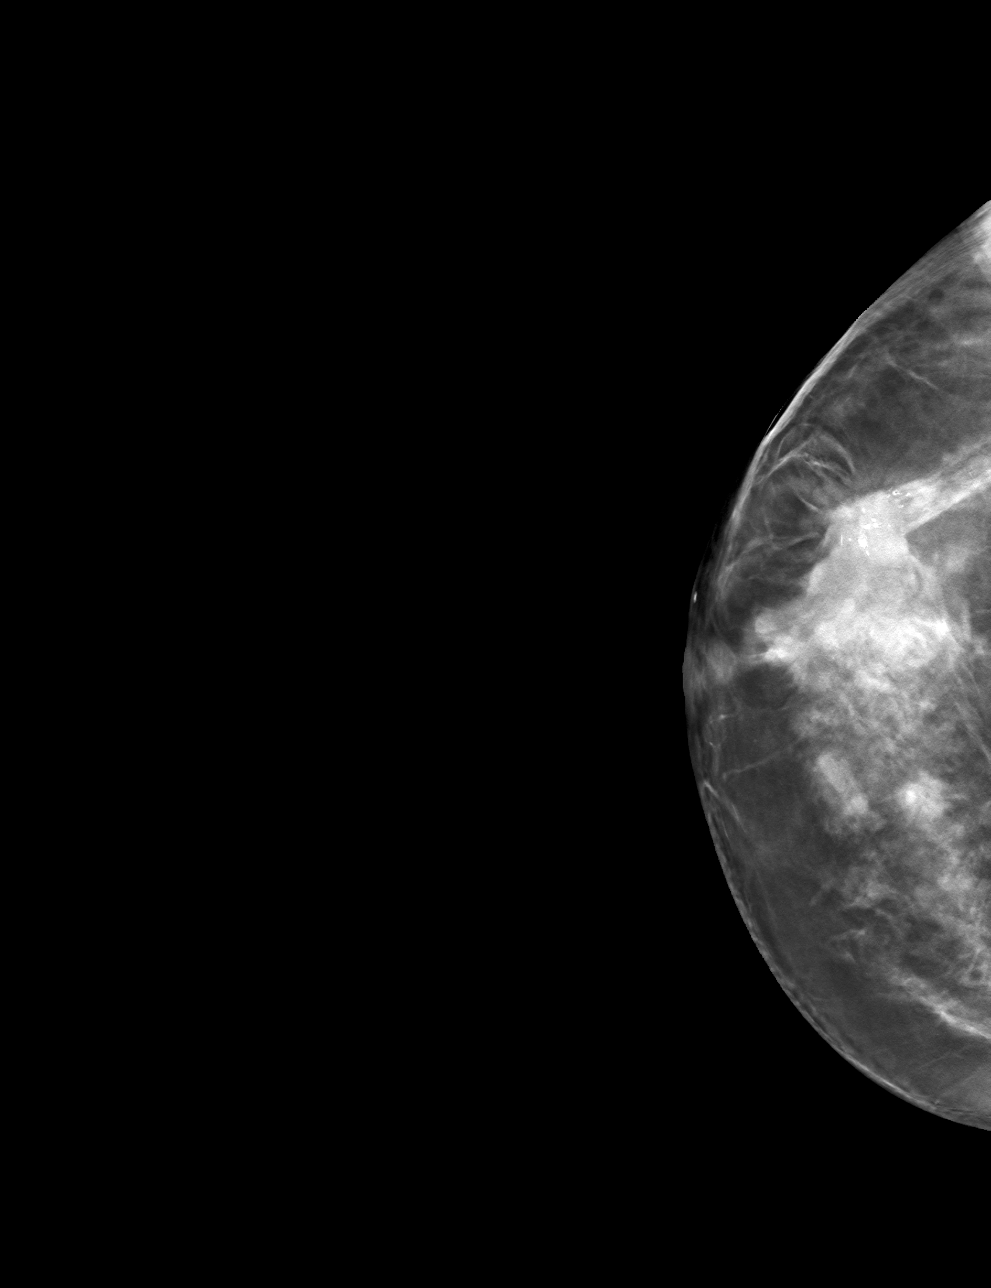

[4 of 12 positions shown; findings below may reference images not displayed]

FINDINGS: Site 1: Right breast mass 9:30 o'clock: Ribbon shaped clip: In
appropriate position.

Site 2: Right breast mass 10 o'clock position: Coil shaped clip: In
appropriate position.
IMPRESSION: Appropriate position biopsy marking clips as above.

Final Assessment: Post Procedure Mammograms for Marker Placement

## 2019-11-16 ENCOUNTER — Encounter: Payer: Self-pay | Admitting: *Deleted

## 2019-11-17 ENCOUNTER — Other Ambulatory Visit: Payer: Self-pay | Admitting: *Deleted

## 2019-11-17 DIAGNOSIS — C50411 Malignant neoplasm of upper-outer quadrant of right female breast: Secondary | ICD-10-CM | POA: Insufficient documentation

## 2019-11-17 DIAGNOSIS — Z170 Estrogen receptor positive status [ER+]: Secondary | ICD-10-CM | POA: Insufficient documentation

## 2019-11-17 NOTE — Progress Notes (Signed)
[place] [facility] NOTE  Patient Care Team: Jonathon Jordan, MD as PCP - General (Family Medicine) Rockwell Germany, RN as Oncology Nurse Navigator Mauro Kaufmann, RN as Oncology Nurse Navigator Erroll Luna, MD as Consulting Physician (General Surgery) Nicholas Lose, MD as Consulting Physician (Hematology and Oncology) Kyung Rudd, MD as Consulting Physician (Radiation Oncology)  CHIEF COMPLAINTS/PURPOSE OF CONSULTATION:  Newly diagnosed breast cancer  HISTORY OF PRESENTING ILLNESS:  [name] 79 y.o. female is here because of recent diagnosis of invasive ductal carcinoma of the right breast. Screening mammogram on 10/16/19 detected a right breast mass with calcifications. Diagnostic mammogram and [place] on 11/03/19 showed a 1.7cm mass at the 10 o'clock position with calcifications, a 0.9cm mass at the 9:30 position, and no right axillary adenopathy. Biopsy on 11/12/19 showed no malignancy at the 9:30 position, and at the 10 o'clock position, invasive ductal carcinoma, grade 3, HER-2 equivocal (2+), ER+ 15% weak, PR- 0%, Ki67 40%. She presents to the clinic today for initial evaluation and discussion of treatment options.   I reviewed her records extensively and collaborated the history with the patient.  SUMMARY OF ONCOLOGIC HISTORY: Oncology History  Malignant neoplasm of upper-outer quadrant of right breast in female, estrogen receptor positive ([place])  11/12/2019 Initial Diagnosis   Screening mammogram detected a 1.7cm mass at the 10 o'clock position with calcifications, a 0.9cm mass at the 9:30 position, and no right axillary adenopathy. Biopsy showed no malignancy at the 9:30 position, and at the 10 o'clock position, IDC, grade 3, HER-2 equivocal (2+), ER+ 15% weak, PR- 0%, Ki67 40%.       MEDICAL HISTORY:  Past Medical History:  Diagnosis Date  . Allergy   . Anemia   . Breast cancer ([place])   . Cataract   . COPD (chronic obstructive pulmonary disease)  ([place])   . Depression   . Essential hypertension 11/29/2014  . Glaucoma   . Hemoptysis   . Hypothyroidism   . Oxygen deficiency    patient was using oxygen at home, her pulmonologist discontinued it and patient stopped using it on Monday Mar 20, 2015  . Restless leg syndrome   . Sinusitis   . Vertigo     SURGICAL HISTORY: Past Surgical History:  Procedure Laterality Date  . ABDOMINAL HYSTERECTOMY    . APPENDECTOMY  2009  . BREAST CYST ASPIRATION Right 06/12/2016  . VESICOVAGINAL FISTULA CLOSURE W/ TAH  1992    SOCIAL HISTORY: Social History   Socioeconomic History  . Marital status: Single    Spouse name: Not on file  . Number of children: Not on file  . Years of education: Not on file  . Highest education level: Not on file  Occupational History  . Occupation: retired    Fish farm manager: [employer]  Tobacco Use  . Smoking status: Never Smoker  . Smokeless tobacco: Never Used  Substance and Sexual Activity  . Alcohol use: No    Alcohol/week: 0.0 standard drinks  . Drug use: No  . Sexual activity: Not on file  Other Topics Concern  . Not on file  Social History Narrative  . Not on file   Social Determinants of Health   Financial Resource Strain:   . Difficulty of Paying Living Expenses: Not on file  Food Insecurity:   . Worried About Charity fundraiser in the Last Year: Not on file  . Ran Out of Food in the Last Year: Not on file  Transportation Needs:   .  Lack of Transportation (Medical): Not on file  . Lack of Transportation (Non-Medical): Not on file  Physical Activity:   . Days of Exercise per Week: Not on file  . Minutes of Exercise per Session: Not on file  Stress:   . Feeling of Stress : Not on file  Social Connections:   . Frequency of Communication with Friends and Family: Not on file  . Frequency of Social Gatherings with Friends and Family: Not on file  . Attends Religious Services: Not on file  . Active Member of Clubs or Organizations: Not on  file  . Attends Club or Organization Meetings: Not on file  . Marital Status: Not on file  Intimate Partner Violence:   . Fear of Current or Ex-Partner: Not on file  . Emotionally Abused: Not on file  . Physically Abused: Not on file  . Sexually Abused: Not on file    FAMILY HISTORY: Family History  Problem Relation Age of Onset  . Emphysema Mother        smoker  . Heart disease Mother   . COPD Mother   . Colon polyps Mother   . Emphysema Father        smoker  . Heart disease Father   . Breast cancer Sister   . Colon cancer Neg Hx   . Esophageal cancer Neg Hx   . Stomach cancer Neg Hx   . Rectal cancer Neg Hx     ALLERGIES:  is allergic to augmentin [amoxicillin-pot clavulanate], black cohosh, codeine, hyoscyamine, oxybutynin chloride, and minocycline.  MEDICATIONS:  Current Outpatient Medications  Medication Sig Dispense Refill  . albuterol (VENTOLIN HFA) 108 (90 Base) MCG/ACT inhaler Inhale 2 puffs into the lungs every 6 (six) hours as needed for wheezing or shortness of breath. 8 g 12  . azelastine (ASTELIN) 0.1 % nasal spray 1-2 puffs each nostril twice daily if needed 30 mL 12  . Azelastine-Fluticasone (DYMISTA) 137-50 MCG/ACT SUSP Place 2 sprays into both nostrils at bedtime. 1 Bottle 0  . AZOPT 1 % ophthalmic suspension Place 1 drop into the left eye 2 (two) times daily.    . benzonatate (TESSALON) 100 MG capsule Take 100 mg by mouth 3 (three) times daily as needed for cough.    . cholecalciferol (VITAMIN D3) 25 MCG (1000 UT) tablet Take 1,000 Units by mouth daily.    . citalopram (CELEXA) 20 MG tablet Take 20 mg by mouth daily.      . Coenzyme Q10 (COQ-10) 100 MG CAPS Take by mouth daily.    . Cyanocobalamin (B-12 PO) Take by mouth daily.    . dorzolamide (TRUSOPT) 2 % ophthalmic solution     . estradiol (ESTRACE) 0.5 MG tablet Take 0.5 mg by mouth daily.      . ferrous gluconate (FERGON) 246 (28 FE) MG tablet Take 246 mg by mouth daily with breakfast.      .  fluticasone (FLONASE) 50 MCG/ACT nasal spray Place 2 sprays into both nostrils daily. (Patient taking differently: Place 2 sprays into both nostrils daily as needed. ) 15 g 2  . latanoprost (XALATAN) 0.005 % ophthalmic solution Place 1 drop into both eyes at bedtime.    . levothyroxine (SYNTHROID, LEVOTHROID) 88 MCG tablet Take 88 mcg by mouth daily.      . Multiple Minerals (CALCIUM/MAGNESIUM/ZINC) TABS Take 3 tablets by mouth at bedtime. 1800mg    . Multiple Vitamin (MULTIVITAMIN) tablet Take 1 tablet by mouth daily.      .   Naproxen Sodium (ALEVE) 220 MG CAPS Take 440 mg by mouth daily. As needed for pain    . NONFORMULARY OR COMPOUNDED ITEM [facility] - Antifungal topical - Terbinafine 3%, Fluconazole 2%, Tea Tree Oil 5%, Urea 10%, Ibuprofen 2%, + Itraconazole 3%, in #30ml DMSO suspension. Apply to affected toenail(s) once at bedtime or twice daily. 30 each 11  . rosuvastatin (CRESTOR) 5 MG tablet     . vitamin C (ASCORBIC ACID) 500 MG tablet Take 500 mg by mouth daily.      . vitamin E 400 UNIT capsule Take 400 Units by mouth daily.    . FLUZONE HIGH-DOSE QUADRIVALENT 0.7 ML SUSY PHARMACIST ADMINISTERED IMMUNIZATION ADMINISTERED AT TIME OF DISPENSING     Current Facility-Administered Medications  Medication Dose Route Frequency Provider Last Rate Last Admin  . 0.9 %  sodium chloride infusion  500 mL Intravenous Continuous Danis, Henry L III, MD        REVIEW OF SYSTEMS:     Breast: Denies any palpable lumps or discharge All other systems were reviewed with the patient and are negative.  PHYSICAL EXAMINATION: ECOG PERFORMANCE STATUS: 1 - Symptomatic but completely ambulatory  Vitals:   11/18/19 1233  BP: 137/67  Pulse: 82  Resp: 17  Temp: 97.6 F (36.4 C)  SpO2: 95%   Filed Weights   11/18/19 1233  Weight: 138 lb 1.6 oz (62.6 kg)       LABORATORY DATA:  I have reviewed the data as listed Lab Results  Component Value Date   WBC 7.3 11/18/2019   HGB 14.6  11/18/2019   HCT 45.7 11/18/2019   MCV 97.2 11/18/2019   PLT 214 11/18/2019   Lab Results  Component Value Date   NA 139 11/18/2019   K 3.7 11/18/2019   CL 104 11/18/2019   CO2 29 11/18/2019    RADIOGRAPHIC STUDIES: I have personally reviewed the radiological reports and agreed with the findings in the report.  ASSESSMENT AND PLAN:  Malignant neoplasm of upper-outer quadrant of right breast in female, estrogen receptor positive ([facility]) 9/23/2021Screening mammogram detected a 1.7cm mass at the 10 o'clock position with calcifications, a 0.9cm mass at the 9:30 position, and no right axillary adenopathy. Biopsy showed no malignancy at the 9:30 position, and at the 10 o'clock position, IDC, grade 3, HER-2 equivocal (2+), ER+ 15% weak, PR- 0%, Ki67 40%.   Pathology and radiology counseling: Discussed with the patient, the details of pathology including the type of breast cancer,the clinical staging, the significance of ER, PR and HER-2/neu receptors and the implications for treatment. After reviewing the pathology in detail, we proceeded to discuss the different treatment options between surgery, radiation, chemotherapy, antiestrogen therapies.  Recommendation: 1.  Breast conserving surgery with sentinel lymph node biopsy 2. adjuvant radiation therapy 3.  Followed by antiestrogen therapy. We will call her with the results of the HER-2 test. If she is HER-2 positive then we will consider adjuvant anti-HER-2 therapy.  Patient does have a moderately high risk of breast cancer recurrence based on low ER receptor status and proliferation rate of 40%. Return to clinic after surgery to discuss final pathology report.  All questions were answered. The patient knows to call the clinic with any problems, questions or concerns.   Vinay K Gudena, MD, MPH 11/18/2019    I, Molly Dorshimer, am acting as scribe for Vinay Gudena, MD.  I have reviewed the above documentation for accuracy and  completeness, and I agree with the above.   

## 2019-11-18 ENCOUNTER — Other Ambulatory Visit: Payer: Self-pay

## 2019-11-18 ENCOUNTER — Other Ambulatory Visit: Payer: Self-pay | Admitting: *Deleted

## 2019-11-18 ENCOUNTER — Ambulatory Visit: Payer: Medicare HMO | Attending: Surgery | Admitting: Physical Therapy

## 2019-11-18 ENCOUNTER — Ambulatory Visit: Payer: Self-pay | Admitting: Surgery

## 2019-11-18 ENCOUNTER — Inpatient Hospital Stay: Payer: Medicare HMO | Attending: Hematology and Oncology | Admitting: Hematology and Oncology

## 2019-11-18 ENCOUNTER — Encounter: Payer: Self-pay | Admitting: Physical Therapy

## 2019-11-18 ENCOUNTER — Inpatient Hospital Stay: Payer: Medicare HMO

## 2019-11-18 ENCOUNTER — Ambulatory Visit
Admission: RE | Admit: 2019-11-18 | Discharge: 2019-11-18 | Disposition: A | Discharge status: Home / Self Care | Payer: Medicare HMO | Source: Ambulatory Visit | Attending: Radiation Oncology | Admitting: Radiation Oncology

## 2019-11-18 ENCOUNTER — Encounter: Payer: Self-pay | Admitting: Hematology and Oncology

## 2019-11-18 DIAGNOSIS — Z8371 Family history of colonic polyps: Secondary | ICD-10-CM

## 2019-11-18 DIAGNOSIS — C50411 Malignant neoplasm of upper-outer quadrant of right female breast: Secondary | ICD-10-CM

## 2019-11-18 DIAGNOSIS — Z170 Estrogen receptor positive status [ER+]: Secondary | ICD-10-CM

## 2019-11-18 DIAGNOSIS — Z885 Allergy status to narcotic agent status: Secondary | ICD-10-CM | POA: Diagnosis not present

## 2019-11-18 DIAGNOSIS — Z836 Family history of other diseases of the respiratory system: Secondary | ICD-10-CM

## 2019-11-18 DIAGNOSIS — R293 Abnormal posture: Secondary | ICD-10-CM | POA: Diagnosis not present

## 2019-11-18 DIAGNOSIS — Z9049 Acquired absence of other specified parts of digestive tract: Secondary | ICD-10-CM

## 2019-11-18 DIAGNOSIS — Z803 Family history of malignant neoplasm of breast: Secondary | ICD-10-CM | POA: Diagnosis not present

## 2019-11-18 DIAGNOSIS — Z79899 Other long term (current) drug therapy: Secondary | ICD-10-CM | POA: Diagnosis not present

## 2019-11-18 DIAGNOSIS — Z880 Allergy status to penicillin: Secondary | ICD-10-CM | POA: Diagnosis not present

## 2019-11-18 DIAGNOSIS — Z881 Allergy status to other antibiotic agents status: Secondary | ICD-10-CM

## 2019-11-18 DIAGNOSIS — C50911 Malignant neoplasm of unspecified site of right female breast: Secondary | ICD-10-CM

## 2019-11-18 DIAGNOSIS — Z8249 Family history of ischemic heart disease and other diseases of the circulatory system: Secondary | ICD-10-CM

## 2019-11-18 LAB — CMP (CANCER CENTER ONLY)
ALT: 18 U/L (ref 0–44)
AST: 25 U/L (ref 15–41)
Albumin: 3.8 g/dL (ref 3.5–5.0)
Alkaline Phosphatase: 99 U/L (ref 38–126)
Anion gap: 6 (ref 5–15)
BUN: 14 mg/dL (ref 8–23)
CO2: 29 mmol/L (ref 22–32)
Calcium: 9.7 mg/dL (ref 8.9–10.3)
Chloride: 104 mmol/L (ref 98–111)
Creatinine: 0.87 mg/dL (ref 0.44–1.00)
GFR, Est AFR Am: >60 mL/min (ref >60)
GFR, Estimated: >60 mL/min (ref >60)
Glucose, Bld: 99 mg/dL (ref 70–99)
Potassium: 3.7 mmol/L (ref 3.5–5.1)
Sodium: 139 mmol/L (ref 135–145)
Total Bilirubin: 0.4 mg/dL (ref 0.3–1.2)
Total Protein: 7.8 g/dL (ref 6.5–8.1)

## 2019-11-18 LAB — CBC WITH DIFFERENTIAL (CANCER CENTER ONLY)
Abs Immature Granulocytes: 0.01 10*3/uL (ref 0.00–0.07)
Basophils Absolute: 0.1 10*3/uL (ref 0.0–0.1)
Basophils Relative: 1 %
Eosinophils Absolute: 0.2 10*3/uL (ref 0.0–0.5)
Eosinophils Relative: 3 %
HCT: 45.7 % (ref 36.0–46.0)
Hemoglobin: 14.6 g/dL (ref 12.0–15.0)
Immature Granulocytes: 0 %
Lymphocytes Relative: 20 %
Lymphs Abs: 1.4 10*3/uL (ref 0.7–4.0)
MCH: 31.1 pg (ref 26.0–34.0)
MCHC: 31.9 g/dL (ref 30.0–36.0)
MCV: 97.2 fL (ref 80.0–100.0)
Monocytes Absolute: 0.7 10*3/uL (ref 0.1–1.0)
Monocytes Relative: 9 %
Neutro Abs: 4.9 10*3/uL (ref 1.7–7.7)
Neutrophils Relative %: 67 %
Platelet Count: 214 10*3/uL (ref 150–400)
RBC: 4.7 MIL/uL (ref 3.87–5.11)
RDW: 13.2 % (ref 11.5–15.5)
WBC Count: 7.3 10*3/uL (ref 4.0–10.5)
nRBC: 0 % (ref 0.0–0.2)

## 2019-11-18 LAB — GENETIC SCREENING ORDER

## 2019-11-18 NOTE — Patient Instructions (Signed)

## 2019-11-18 NOTE — Therapy (Signed)
[name], [name], [address] Phone: [phone]   Fax:  [phone]  Physical Therapy Evaluation  Patient Details  Name: [name] MRN: [id number] Date of Birth: 1940-04-13 Referring Provider (PT): Dr. Erroll Luna   Encounter Date: 11/18/2019   PT End of Session - 11/18/19 1948    Visit Number 1    Number of Visits 2    Date for PT Re-Evaluation 01/13/20    PT Start Time 1409    PT Stop Time 8110    PT Time Calculation (min) 27 min    Activity Tolerance Patient tolerated treatment well    Behavior During Therapy [facility] for tasks assessed/performed           Past Medical History:  Diagnosis Date   Allergy    Anemia    Breast cancer ([place])    Cataract    COPD (chronic obstructive pulmonary disease) ([place])    Depression    Essential hypertension 11/29/2014   Glaucoma    Hemoptysis    Hypothyroidism    Oxygen deficiency    patient was using oxygen at home, her pulmonologist discontinued it and patient stopped using it on Monday Mar 20, 2015   Restless leg syndrome    Sinusitis    Vertigo     Past Surgical History:  Procedure Laterality Date   ABDOMINAL HYSTERECTOMY     APPENDECTOMY  2009   BREAST CYST ASPIRATION Right 06/12/2016   VESICOVAGINAL FISTULA CLOSURE W/ TAH  1992    There were no vitals filed for this visit.    Subjective Assessment - 11/18/19 1909    Subjective Patient reports she is here today to be seen by her medical team for her newly diagnosed right breast cancer.    Patient is accompained by: Family member    Pertinent History Patient was diagnosed on 10/16/2019 with right grade III invasive ductal carcinoma breast cnacer. It measures 1.7 cm and is located in the upper outer quadrant. It is ER positive, PR negative, and HER2 negative with a Ki67 of 40%.    Patient Stated Goals Reduce lymphedema risk and learn post op shoulder ROM HEP     Currently in Pain? No/denies              [facility] PT Assessment - 11/18/19 0001      Assessment   Medical Diagnosis Right breast cancer    Referring Provider (PT) Dr. [name] [place]    Onset Date/Surgical Date 10/16/19    Hand Dominance Right    Prior Therapy none      Precautions   Precautions Other (comment)    Precaution Comments active cancer      Restrictions   Weight Bearing Restrictions No      Balance Screen   Has the patient fallen in the past 6 months No    Has the patient had a decrease in activity level because of a fear of falling?  No    Is the patient reluctant to leave their home because of a fear of falling?  No      Home Environment   Living Environment Private residence    Living Arrangements Alone    Available Help at Discharge Family      Prior Function   Level of [place] Retired    Leisure She walks 15 minutes 1-2x/week      Cognition   Overall Cognitive Status Within Functional Limits  for tasks assessed      Posture/Postural Control   Posture/Postural Control Postural limitations    Postural Limitations Rounded Shoulders;Forward head;Increased thoracic kyphosis      ROM / Strength   AROM / PROM / Strength AROM;Strength      AROM   AROM Assessment Site Shoulder;Cervical    Right/Left Shoulder Right;Left    Right Shoulder Extension 50 Degrees    Right Shoulder Flexion 150 Degrees    Right Shoulder ABduction 151 Degrees    Right Shoulder Internal Rotation 56 Degrees    Right Shoulder External Rotation 68 Degrees    Left Shoulder Extension 58 Degrees    Left Shoulder Flexion 144 Degrees    Left Shoulder ABduction 165 Degrees    Left Shoulder Internal Rotation 52 Degrees    Left Shoulder External Rotation 78 Degrees    Cervical Flexion WNL    Cervical Extension 90% limited    Cervical - Right Side Bend 75% limited    Cervical - Left Side Bend 75% limited    Cervical - Right Rotation 75% limited    Cervical  - Left Rotation 75% limited      Strength   Overall Strength Within functional limits for tasks performed             LYMPHEDEMA/ONCOLOGY QUESTIONNAIRE - 11/18/19 0001      Type   Cancer Type Right breast cancer      Lymphedema Assessments   Lymphedema Assessments Upper extremities      Right Upper Extremity Lymphedema   10 cm Proximal to Olecranon Process 23.5 cm    Olecranon Process 22.8 cm    10 cm Proximal to Ulnar Styloid Process 19 cm    Just Proximal to Ulnar Styloid Process 14.5 cm    Across Hand at [employer] 17.4 cm    At [place] of 2nd Digit 6 cm      Left Upper Extremity Lymphedema   10 cm Proximal to Olecranon Process 23.8 cm    Olecranon Process 23.4 cm    10 cm Proximal to Ulnar Styloid Process 19.5 cm    Just Proximal to Ulnar Styloid Process 14.5 cm    Across Hand at [employer] 18.3 cm    At [place] of 2nd Digit 5.8 cm           L-DEX FLOWSHEETS - 11/18/19 1900      L-DEX LYMPHEDEMA SCREENING   Measurement Type Unilateral    L-DEX MEASUREMENT EXTREMITY Upper Extremity    POSITION  Standing    DOMINANT SIDE Right    At Risk Side Right    BASELINE SCORE (UNILATERAL) -5.2           The patient was assessed using the L-Dex machine today to produce a lymphedema index baseline score. The patient will be reassessed on a regular basis (typically every 3 months) to obtain new L-Dex scores. If the score is > 6.5 points away from his/her baseline score indicating onset of subclinical lymphedema, it will be recommended to wear a compression garment for 4 weeks, 12 hours per day and then be reassessed. If the score continues to be > 6.5 points from baseline at reassessment, we will initiate lymphedema treatment. Assessing in this manner has a 95% rate of preventing clinically significant lymphedema.      [name] - 11/18/19 0001    Open a tight or new jar No difficulty    Do heavy household chores (wash walls, wash  floors) Moderate difficulty     Carry a shopping bag or briefcase No difficulty    Wash your back Moderate difficulty    Use a knife to cut food No difficulty    Recreational activities in which you take some force or impact through your arm, shoulder, or hand (golf, hammering, tennis) No difficulty    During the past week, to what extent has your arm, shoulder or hand problem interfered with your normal social activities with family, friends, neighbors, or groups? Not at all    During the past week, to what extent has your arm, shoulder or hand problem limited your work or other regular daily activities Not at all    Arm, shoulder, or hand pain. None    Tingling (pins and needles) in your arm, shoulder, or hand None    Difficulty Sleeping No difficulty    DASH Score 9.09 %            Objective measurements completed on examination: See above findings.        Patient was instructed today in a home exercise program today for post op shoulder range of motion. These included active assist shoulder flexion in sitting, scapular retraction, wall walking with shoulder abduction, and hands behind head external rotation.  She was encouraged to do these twice a day, holding 3 seconds and repeating 5 times when permitted by her physician.           PT Education - 11/18/19 1946    Education Details Lymphedema risk reduction and post op shoulder ROM HEP    Person(s) Educated Patient;Child(ren)   Son   Methods Explanation;Demonstration;Handout    Comprehension Returned demonstration;Verbalized understanding;Verbal cues required             PT Long Term Goals - 11/18/19 1952      PT LONG TERM GOAL #1   Title Patient will demonstrate she has regained full shoulder ROM and function post operatively compared to baselines.    Time 8    Period Weeks    Status New    Target Date 01/13/20           Breast Clinic Goals - 11/18/19 1952      Patient will be able to verbalize understanding of pertinent lymphedema  risk reduction practices relevant to her diagnosis specifically related to skin care.   Time 1    Period Days    Status Achieved      Patient will be able to return demonstrate and/or verbalize understanding of the post-op home exercise program related to regaining shoulder range of motion.   Time 1    Period Days    Status Achieved      Patient will be able to verbalize understanding of the importance of attending the postoperative After Breast Cancer Class for further lymphedema risk reduction education and therapeutic exercise.   Time 1    Period Days    Status Achieved                 Plan - 11/18/19 1948    Clinical Impression Statement Patient was diagnosed on 10/16/2019 with right grade III invasive ductal carcinoma breast cnacer. It measures 1.7 cm and is located in the upper outer quadrant. It is ER positive, PR negative, and HER2 negative with a Ki67 of 40%. Her multidisciplinary medical team met prior to her assessments to determine a recommended treatment plan. She is planning to have a right lumpectomy and sentinel node biopsy  followed by radiation and anti-estrogen therapy.    Stability/Clinical Decision Making Stable/Uncomplicated    Clinical Decision Making Low    Rehab Potential Excellent    PT Frequency --   Eval and 1 f/u visit   PT Treatment/Interventions ADLs/Self Care Home Management;Therapeutic exercise;Patient/family education    PT Next Visit Plan Will reassess 3-4 weeks post op to determine needs    PT Home Exercise Plan Post op shoulder ROM HEP    Consulted and Agree with Plan of Care Patient;Family member/caregiver    Family Member Consulted son           Patient will benefit from skilled therapeutic intervention in order to improve the following deficits and impairments:  Decreased knowledge of precautions, Impaired UE functional use, Pain, Decreased range of motion, Postural dysfunction  Visit Diagnosis: Malignant neoplasm of upper-outer  quadrant of right breast in female, estrogen receptor positive ([place]) - Plan: PT plan of care cert/re-cert  Abnormal posture - Plan: PT plan of care cert/re-cert   Patient will follow up at outpatient cancer rehab 3-4 weeks following surgery.  If the patient requires physical therapy at that time, a specific plan will be dictated and sent to the referring physician for approval. The patient was educated today on appropriate basic range of motion exercises to begin post operatively and the importance of attending the After Breast Cancer class following surgery.  Patient was educated today on lymphedema risk reduction practices as it pertains to recommendations that will benefit the patient immediately following surgery.  She verbalized good understanding.      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

## 2019-11-18 NOTE — Assessment & Plan Note (Signed)
9/23/2021Screening mammogram detected a 1.7cm mass at the 10 o'clock position with calcifications, a 0.9cm mass at the 9:30 position, and no right axillary adenopathy. Biopsy showed no malignancy at the 9:30 position, and at the 10 o'clock position, IDC, grade 3, HER-2 equivocal (2+), ER+ 15% weak, PR- 0%, Ki67 40%.   Pathology and radiology counseling: Discussed with the patient, the details of pathology including the type of breast cancer,the clinical staging, the significance of ER, PR and HER-2/neu receptors and the implications for treatment. After reviewing the pathology in detail, we proceeded to discuss the different treatment options between surgery, radiation, chemotherapy, antiestrogen therapies.  Recommendation: 1.  Breast conserving surgery with sentinel lymph node biopsy 2. adjuvant radiation therapy 3.  Followed by antiestrogen therapy.  Patient does have a moderately high risk of breast cancer recurrence based on low ER receptor status and proliferation rate of 40%.

## 2019-11-18 NOTE — H&P (Signed)
[facility] Wellen Appointment: 11/18/2019 1:00 PM Location: [facility] Patient #: [id number] DOB: September 23, 1940 Undefined / Language: Suszanne Conners / Race: White Female  History of Present Illness [name] A. Montrice Gracey MD; 11/18/2019 2:03 PM) Patient words: Pt presents to the [facility] for evaluation of right breast mass on SDM. She had a sister with breast cancer. 1 .7 cm mass 10 oclock IDC grade 3 ER 15 % PR - HER 2 NEU -KI 40 %. sORE FROM THE BIOPSY BUT NO OTHER COMPLAINTS    ADDITIONAL INFORMATION: 2. PROGNOSTIC INDICATORS Results: IMMUNOHISTOCHEMICAL AND MORPHOMETRIC ANALYSIS PERFORMED MANUALLY The tumor cells are EQUIVOCAL for Her2 (2+). Her2 by FISH will be performed and results reported separately. Estrogen Receptor: 15%, POSITIVE, WEAK STAINING INTENSITY Progesterone Receptor: 0%, NEGATIVE Proliferation Marker Ki67: 40% COMMENT: The negative hormone receptor study(ies) in this case has an internal positive control. REFERENCE RANGE ESTROGEN RECEPTOR NEGATIVE 0% POSITIVE =>1% REFERENCE RANGE PROGESTERONE RECEPTOR NEGATIVE 0% POSITIVE =>1% All controls stained appropriately Thressa Sheller MD Pathologist, Electronic Signature ( Signed 11/16/2019) FINAL DIAGNOSIS Diagnosis 1. Breast, right, needle core biopsy, 9:30 o'clock - FIBROCYSTIC CHANGE. - PSEUDOANGIOMATOUS STROMAL HYPERPLASIA (PASH). 1 of 3 FINAL for CAILA, CIRELLI S ([id number]) Diagnosis(continued) - NO MALIGNANCY IDENTIFIED. 2. Breast, right, needle core biopsy, 10 o'clock - INVASIVE DUCTAL CARCINOMA, SEE COMMENT. Microscopic Comment 2. The carcinoma appears grade 3 and measures 12 mm in greatest linear extent. Prognostic makers will be ordered. Dr. Jeannie Done has reviewed the case. The case was called to [facility] on 11/13/2019. Vicente Males MD Pathologist, Electronic Signature (Case signed 11/13/2019) Specimen Gross and Clinical Information Specimen Comment 1. TIF: 1:20 pm, CIT: < 30  seconds, mass 2. TIF: 1:25 pm, CIT: < 30 seconds, mass Specimen(s) Obtained: 1. Breast, right, needle core biopsy, 9:30 o'clock 2. Breast, right, needle core biopsy, 10 o'clock Specimen Clinical.  The patient is a 79 year old female.   Medication History Conni Slipper, RN; 11/18/2019 8:18 AM) Medications Reconciled     Review of Systems [name] A. Aleysha Meckler MD; 11/18/2019 2:03 PM) All other systems negative   Physical Exam (Aime Carreras A. Katyana Trolinger MD; 11/18/2019 2:04 PM)  General Mental Status-Alert. General Appearance-Consistent with stated age. Hydration-Well hydrated. Voice-Normal.  Breast Note: BRUISING RIGHT BREAST MASS / HEMATOMA AT 10 OCLOCK LEFT BREAST NORMAL  Cardiovascular Cardiovascular examination reveals -on palpation PMI is normal in location and amplitude, no palpable S3 or S4. Normal cardiac borders., normal heart sounds, regular rate and rhythm with no murmurs, carotid auscultation reveals no bruits and normal pedal pulses bilaterally.  Musculoskeletal Normal Exam - Left-Upper Extremity Strength Normal and Lower Extremity Strength Normal. Normal Exam - Right-Upper Extremity Strength Normal, Lower Extremity Weakness.    Assessment & Plan (Beverely Suen A. Petro Talent MD; 11/18/2019 2:05 PM)  BREAST CANCER, RIGHT (C50.911) Impression: PT OPTED FOR RIGHT BREAST LUMPECTOMY X 2 AND sln MAPPING Risk of lumpectomy include bleeding, infection, seroma, more surgery, use of seed/wire, wound care, cosmetic deformity and the need for other treatments, death , blood clots, death. Pt agrees to proceed. Risk of sentinel lymph node mapping include bleeding, infection, lymphedema, shoulder pain. stiffness, dye allergy. cosmetic deformity , blood clots, death, need for more surgery. Pt agrees to proceed.  Current Plans You are being scheduled for surgery- Our schedulers will call you.  You should hear from our office's scheduling department within 5 working days about the  location, date, and time of surgery. We try to make accommodations for patient's preferences in scheduling surgery, but sometimes the  OR schedule or the surgeon's schedule prevents [place] from making those accommodations.  If you have not heard from our office [phone]) in 5 working days, call the office and ask for your surgeon's nurse.  If you have other questions about your diagnosis, plan, or surgery, call the office and ask for your surgeon's nurse.  Pt Education - [facility] Breast Cancer Information Given - [facility] "Breast Journey" Package We discussed the staging and pathophysiology of breast cancer. We discussed all of the different options for treatment for breast cancer including surgery, chemotherapy, radiation therapy, Herceptin, and antiestrogen therapy. We discussed a sentinel lymph node biopsy as she does not appear to having lymph node involvement right now. We discussed the performance of that with injection of radioactive tracer and blue dye. We discussed that she would have an incision underneath her axillary hairline. We discussed that there is a bout a 10-20% chance of having a positive node with a sentinel lymph node biopsy and we will await the permanent pathology to make any other first further decisions in terms of her treatment. One of these options might be to return to the operating room to perform an axillary lymph node dissection. We discussed about a 1-2% risk lifetime of chronic shoulder pain as well as lymphedema associated with a sentinel lymph node biopsy. We discussed the options for treatment of the breast cancer which included lumpectomy versus a mastectomy. We discussed the performance of the lumpectomy with a wire placement. We discussed a 10-20% chance of a positive margin requiring reexcision in the operating room. We also discussed that she may need radiation therapy or antiestrogen therapy or both if she undergoes lumpectomy. We discussed the mastectomy and the  postoperative care for that as well. We discussed that there is no difference in her survival whether she undergoes lumpectomy with radiation therapy or antiestrogen therapy versus a mastectomy. There is a slight difference in the local recurrence rate being 3-5% with lumpectomy and about 1% with a mastectomy. We discussed the risks of operation including bleeding, infection, possible reoperation. She understands her further therapy will be based on what her stages at the time of her operation.  Pt Education - [facility] breast cancer surgery: discussed with patient and provided information. Pt Education - [facility] (After Breast Cancer) Class Info: discussed with patient and provided information.

## 2019-11-18 NOTE — Progress Notes (Signed)
Radiation Oncology         [phone] ________________________________  Name: FLORABELLE CARDIN        MRN: [id number]  Date of Service: 11/18/2019 DOB: April 12, 1940  CC:Jonathon Jordan, MD  Erroll Luna, MD     REFERRING PHYSICIAN: Erroll Luna, MD   DIAGNOSIS: The encounter diagnosis was Malignant neoplasm of upper-outer quadrant of right breast in female, estrogen receptor positive ([place]).   HISTORY OF PRESENT ILLNESS: DEZIREE [name] is a 79 y.o. female seen in the multidisciplinary breast clinic for a new diagnosis of right breast cancer. The patient was noted to have a screening detected abnormality in the right breast.  She underwent diagnostic imaging which identified a mass at the 10 o'clock position measuring 1.7 cm in greatest dimension.  A second lesion at the 9:30 position was measured at 9 mm and no evidence of adenopathy was identified.  She underwent a biopsy on 11/12/2019 the 9:30 position revealed benign findings consistent with PASH.  The 10 o'clock position however revealed an invasive ductal carcinoma that was grade 3, her tumor was ER positive, though weak, PR negative, HER-2 negative reflex and to [place] with a Ki-67 of 40%.  She is seen today to discuss treatment recommendations of her cancer.     PREVIOUS RADIATION THERAPY: No   PAST MEDICAL HISTORY:  Past Medical History:  Diagnosis Date   Allergy    Anemia    Breast cancer ([place])    Cataract    COPD (chronic obstructive pulmonary disease) ([place])    Depression    Essential hypertension 11/29/2014   Glaucoma    Hemoptysis    Hypothyroidism    Oxygen deficiency    patient was using oxygen at home, her pulmonologist discontinued it and patient stopped using it on Monday Mar 20, 2015   Restless leg syndrome    Sinusitis    Vertigo        PAST SURGICAL HISTORY: Past Surgical History:  Procedure Laterality Date   ABDOMINAL HYSTERECTOMY     APPENDECTOMY  2009   BREAST CYST  ASPIRATION Right 06/12/2016   VESICOVAGINAL FISTULA CLOSURE W/ TAH  1992     FAMILY HISTORY:  Family History  Problem Relation Age of Onset   Emphysema Mother        smoker   Heart disease Mother    COPD Mother    Colon polyps Mother    Emphysema Father        smoker   Heart disease Father    Breast cancer Sister    Colon cancer Neg Hx    Esophageal cancer Neg Hx    Stomach cancer Neg Hx    Rectal cancer Neg Hx      SOCIAL HISTORY:  reports that she has never smoked. She has never used smokeless tobacco. She reports that she does not drink alcohol and does not use drugs. The patient is single and lives in [place]. She has 4 adult sons, 3 of which live in the [place] area and one in [place] who accompanies her today.   ALLERGIES: Augmentin [amoxicillin-pot clavulanate], Black cohosh, Codeine, Hyoscyamine, Oxybutynin chloride, and Minocycline   MEDICATIONS:  Current Outpatient Medications  Medication Sig Dispense Refill   albuterol (VENTOLIN HFA) 108 (90 Base) MCG/ACT inhaler Inhale 2 puffs into the lungs every 6 (six) hours as needed for wheezing or shortness of breath. 8 g 12   azelastine (ASTELIN) 0.1 % nasal spray 1-2 puffs each nostril twice daily if needed  30 mL 12   Azelastine-Fluticasone (DYMISTA) 137-50 MCG/ACT SUSP Place 2 sprays into both nostrils at bedtime. 1 Bottle 0   AZOPT 1 % ophthalmic suspension Place 1 drop into the left eye 2 (two) times daily.     benzonatate (TESSALON) 100 MG capsule Take 100 mg by mouth 3 (three) times daily as needed for cough.     cholecalciferol (VITAMIN D3) 25 MCG (1000 UT) tablet Take 1,000 Units by mouth daily.     citalopram (CELEXA) 20 MG tablet Take 20 mg by mouth daily.       Coenzyme Q10 (COQ-10) 100 MG CAPS Take by mouth daily.     Cyanocobalamin (B-12 PO) Take by mouth daily.     dorzolamide (TRUSOPT) 2 % ophthalmic solution      estradiol (ESTRACE) 0.5 MG tablet Take 0.5 mg by mouth daily.        ferrous gluconate (FERGON) 246 (28 FE) MG tablet Take 246 mg by mouth daily with breakfast.       fluticasone (FLONASE) 50 MCG/ACT nasal spray Place 2 sprays into both nostrils daily. (Patient taking differently: Place 2 sprays into both nostrils daily as needed. ) 15 g 2   FLUZONE HIGH-DOSE QUADRIVALENT 0.7 ML SUSY PHARMACIST ADMINISTERED IMMUNIZATION ADMINISTERED AT TIME OF DISPENSING     latanoprost (XALATAN) 0.005 % ophthalmic solution Place 1 drop into both eyes at bedtime.     levothyroxine (SYNTHROID, LEVOTHROID) 88 MCG tablet Take 88 mcg by mouth daily.       Multiple Minerals (CALCIUM/MAGNESIUM/ZINC) TABS Take 3 tablets by mouth at bedtime. 1836m     Multiple Vitamin (MULTIVITAMIN) tablet Take 1 tablet by mouth daily.       Naproxen Sodium (ALEVE) 220 MG CAPS Take 440 mg by mouth daily. As needed for pain     NONFORMULARY OR COMPOUNDED ITEM [facility] - Antifungal topical - Terbinafine 3%, Fluconazole 2%, Tea Tree Oil 5%, Urea 10%, Ibuprofen 2%, + Itraconazole 3%, in #323mDMSO suspension. Apply to affected toenail(s) once at bedtime or twice daily. 30 each 11   rosuvastatin (CRESTOR) 5 MG tablet      vitamin C (ASCORBIC ACID) 500 MG tablet Take 500 mg by mouth daily.       vitamin E 400 UNIT capsule Take 400 Units by mouth daily.     Current Facility-Administered Medications  Medication Dose Route Frequency Provider Last Rate Last Admin   0.9 %  sodium chloride infusion  500 mL Intravenous Continuous Danis, HeEstill CottaII, MD         REVIEW OF SYSTEMS: On review of systems, the patient reports that she is doing well overall. She denies any specific concerns with her breasts.    PHYSICAL EXAM:  Wt Readings from Last 3 Encounters:  11/18/19 138 lb 1.6 oz (62.6 kg)  11/27/18 141 lb (64 kg)  11/26/17 142 lb (64.4 kg)   Temp Readings from Last 3 Encounters:  11/18/19 97.6 F (36.4 C) (Tympanic)  04/28/19 98.2 F (36.8 C)  11/27/18 97.8 F (36.6 C)  (Temporal)   BP Readings from Last 3 Encounters:  11/18/19 137/67  11/27/18 122/70  11/26/17 110/78   Pulse Readings from Last 3 Encounters:  11/18/19 82  11/27/18 78  11/26/17 86    In general this is a well appearing caucasian female in no acute distress. She's alert and oriented x4 and appropriate throughout the examination. Cardiopulmonary assessment is negative for acute distress and she exhibits normal effort. Bilateral breast exam  is deferred.    ECOG = 0  0 - Asymptomatic (Fully active, able to carry on all predisease activities without restriction)  1 - Symptomatic but completely ambulatory (Restricted in physically strenuous activity but ambulatory and able to carry out work of a light or sedentary nature. For example, light housework, office work)  2 - Symptomatic, <50% in bed during the day (Ambulatory and capable of all self care but unable to carry out any work activities. Up and about more than 50% of waking hours)  3 - Symptomatic, >50% in bed, but not bedbound (Capable of only limited self-care, confined to bed or chair 50% or more of waking hours)  4 - Bedbound (Completely disabled. Cannot carry on any self-care. Totally confined to bed or chair)  5 - Death   Eustace Pen MM, Creech RH, Tormey DC, et al. [phone]). "Toxicity and response criteria of the [facility] Group". [place] Oncol. 5 (6): 649-55    LABORATORY DATA:  Lab Results  Component Value Date   WBC 7.3 11/18/2019   HGB 14.6 11/18/2019   HCT 45.7 11/18/2019   MCV 97.2 11/18/2019   PLT 214 11/18/2019   Lab Results  Component Value Date   NA 139 11/18/2019   K 3.7 11/18/2019   CL 104 11/18/2019   CO2 29 11/18/2019   Lab Results  Component Value Date   ALT 18 11/18/2019   AST 25 11/18/2019   ALKPHOS 99 11/18/2019   BILITOT 0.4 11/18/2019      RADIOGRAPHY: US BREAST LTD UNI RIGHT INC AXILLA  Result Date: 11/03/2019 CLINICAL DATA:  Patient recalled from screening for  right breast mass and calcifications EXAM: DIGITAL DIAGNOSTIC RIGHT MAMMOGRAM WITH CAD AND TOMO ULTRASOUND RIGHT BREAST COMPARISON:  Previous exam(s). ACR Breast Density Category d: The breast tissue is extremely dense, which lowers the sensitivity of mammography. FINDINGS: With the upper-outer right breast middle depth there is a persistent irregular mass further evaluated with spot compression CC and MLO tomosynthesis images. This mass contains approximately 2.2 cm of coarse heterogeneous calcifications, further evaluated with magnification views. Mammographic images were processed with CAD. Targeted ultrasound is performed, showing a 1.7 x 1.3 x 1.5 cm irregular hypoechoic mass right breast 10 o'clock position 3 cm from nipple containing echogenic foci compatible with coarse heterogeneous calcifications on mammography. Within the right breast 9:30 o'clock 3 cm from nipple there is a 0.9 x 0.7 x 0.9 cm solid and cystic mass. No right axillary adenopathy. IMPRESSION: 1. Suspicious irregular solid mass right breast 10 o'clock position. There are associated coarse heterogeneous calcifications within the mass identified on mammogram and ultrasound. 2. Indeterminate right breast mass 9:30 o'clock, potentially representing a complicated cyst. RECOMMENDATION: 1. Ultrasound-guided core needle biopsy right breast mass 10 o'clock position. 2. Ultrasound-guided core needle aspiration/biopsy right breast mass 9:30 o'clock. 3. There are associated coarse heterogeneous calcifications with the mass right breast 10 o'clock position 3 cm from nipple which would need to be removed at the time of surgical treatment of the suspected malignancy in the right breast 10 o'clock position. 4. Recommend bilateral breast MRI after completion of the biopsy procedures given the dense breast and calcifications. I have discussed the findings and recommendations with the patient. If applicable, a reminder letter will be sent to the patient  regarding the next appointment. BI-RADS CATEGORY  5: Highly suggestive of malignancy. Electronically Signed   By: Lovey Newcomer M.D.   On: 11/03/2019 12:42   MM DIAG BREAST TOMO UNI  RIGHT  Result Date: 11/03/2019 CLINICAL DATA:  Patient recalled from screening for right breast mass and calcifications EXAM: DIGITAL DIAGNOSTIC RIGHT MAMMOGRAM WITH CAD AND TOMO ULTRASOUND RIGHT BREAST COMPARISON:  Previous exam(s). ACR Breast Density Category d: The breast tissue is extremely dense, which lowers the sensitivity of mammography. FINDINGS: With the upper-outer right breast middle depth there is a persistent irregular mass further evaluated with spot compression CC and MLO tomosynthesis images. This mass contains approximately 2.2 cm of coarse heterogeneous calcifications, further evaluated with magnification views. Mammographic images were processed with CAD. Targeted ultrasound is performed, showing a 1.7 x 1.3 x 1.5 cm irregular hypoechoic mass right breast 10 o'clock position 3 cm from nipple containing echogenic foci compatible with coarse heterogeneous calcifications on mammography. Within the right breast 9:30 o'clock 3 cm from nipple there is a 0.9 x 0.7 x 0.9 cm solid and cystic mass. No right axillary adenopathy. IMPRESSION: 1. Suspicious irregular solid mass right breast 10 o'clock position. There are associated coarse heterogeneous calcifications within the mass identified on mammogram and ultrasound. 2. Indeterminate right breast mass 9:30 o'clock, potentially representing a complicated cyst. RECOMMENDATION: 1. Ultrasound-guided core needle biopsy right breast mass 10 o'clock position. 2. Ultrasound-guided core needle aspiration/biopsy right breast mass 9:30 o'clock. 3. There are associated coarse heterogeneous calcifications with the mass right breast 10 o'clock position 3 cm from nipple which would need to be removed at the time of surgical treatment of the suspected malignancy in the right breast 10  o'clock position. 4. Recommend bilateral breast MRI after completion of the biopsy procedures given the dense breast and calcifications. I have discussed the findings and recommendations with the patient. If applicable, a reminder letter will be sent to the patient regarding the next appointment. BI-RADS CATEGORY  5: Highly suggestive of malignancy. Electronically Signed   By: Lovey Newcomer M.D.   On: 11/03/2019 12:42   MM CLIP PLACEMENT RIGHT  Result Date: 11/12/2019 CLINICAL DATA:  Patient status post ultrasound-guided core needle biopsy right breast masses. EXAM: DIAGNOSTIC RIGHT MAMMOGRAM POST ULTRASOUND BIOPSY COMPARISON:  Previous exam(s). FINDINGS: Site 1: Right breast mass 9:30 o'clock: Ribbon shaped clip: In appropriate position. Site 2: Right breast mass 10 o'clock position: Coil shaped clip: In appropriate position. IMPRESSION: Appropriate position biopsy marking clips as above. Final Assessment: Post Procedure Mammograms for Marker Placement Electronically Signed   By: Lovey Newcomer M.D.   On: 11/12/2019 13:52   [place] RT BREAST BX W LOC DEV 1ST LESION IMG BX SPEC US GUIDE  Addendum Date: 11/17/2019   ADDENDUM REPORT: 11/16/2019 14:02 ADDENDUM: PATHOLOGY revealed: Site 1. Breast, RIGHT, needle core biopsy, 9:30 o'clock - FIBROCYSTIC CHANGE. - PSEUDOANGIOMATOUS STROMAL HYPERPLASIA (PASH).- NO MALIGNANCY IDENTIFIED. Pathology results are CONCORDANT with imaging findings, per Dr. Lovey Newcomer. PATHOLOGY revealed: Site 2. Breast, RIGHT, needle core biopsy, 10 o'clock- INVASIVE DUCTAL CARCINOMA, SEE COMMENT. Microscopic Comment 2. The carcinoma appears grade 3 and measures 12 mm in greatest linear extent. Prognostic makers will be ordered. Pathology results are CONCORDANT with imaging findings, per Dr. Lovey Newcomer. Pathology results and recommendations below were discussed with patient by Stacie Acres RN via telephone on 11/16/2019. Patient reported biopsy site within normal limits with slight tenderness at the  site. Post biopsy care instructions were reviewed, questions were answered and my direct phone number was provided to patient. Patient was instructed to call [place] if any concerns or questions arise related to the biopsy. Recommendations: 1. Patient was [facility], with appointment on 11/18/2019. 2. There are associated coarse heterogeneous calcifications with the RIGHT breast mass at 10 o'clock position (3 cm from nipple) which would need excision at time of surgical treatment of the suspected malignancy in the RIGHT breast, 10 o'clock position. 3. Recommend bilateral breast MRI after completion of biopsy procedures given dense breasts and calcifications. Pathology results reported by Electa Sniff RN on 11/16/2019. Electronically Signed   By: Lovey Newcomer M.D.   On: 11/16/2019 14:02   Result Date: 11/17/2019 CLINICAL DATA:  Patient with indeterminate right breast mass 9:30 o'clock and 10 o'clock position. EXAM: ULTRASOUND GUIDED RIGHT BREAST CORE NEEDLE BIOPSY COMPARISON:  Previous exam(s). PROCEDURE: I met with the patient and we discussed the procedure of ultrasound-guided biopsy, including benefits and alternatives. We discussed the high likelihood of a successful procedure. We discussed the risks of the procedure, including infection, bleeding, tissue injury, clip migration, and inadequate sampling. Informed written consent was given. The usual time-out protocol was performed immediately prior to the procedure. Site 1: Right breast mass 9:30 o'clock Lesion quadrant: Upper outer quadrant Using sterile technique and 1% Lidocaine as local anesthetic, under direct ultrasound visualization, a 14 gauge spring-loaded device was used to perform biopsy of right breast mass 9:30 o'clock using a lateral approach. At the conclusion of the procedure ribbon shaped tissue marker clip was deployed into the biopsy  cavity. Follow up 2 view mammogram was performed and dictated separately. Site 2: Right breast mass 10 o'clock Lesion quadrant: Upper outer quadrant Using sterile technique and 1% Lidocaine as local anesthetic, under direct ultrasound visualization, a 14 gauge spring-loaded device was used to perform biopsy of right breast mass 9:30 o'clock using a lateral approach. At the conclusion of the procedure coil shaped tissue marker clip was deployed into the biopsy cavity. Follow up 2 view mammogram was performed and dictated separately. IMPRESSION: Ultrasound guided biopsy of right breast mass 9:30 o'clock and 10 o'clock position. No apparent complications. Electronically Signed: By: Lovey Newcomer M.D. On: 11/12/2019 13:51   [place] RT BREAST BX W LOC DEV EA ADD LESION IMG BX SPEC US GUIDE  Addendum Date: 11/17/2019   ADDENDUM REPORT: 11/16/2019 14:02 ADDENDUM: PATHOLOGY revealed: Site 1. Breast, RIGHT, needle core biopsy, 9:30 o'clock - FIBROCYSTIC CHANGE. - PSEUDOANGIOMATOUS STROMAL HYPERPLASIA (PASH).- NO MALIGNANCY IDENTIFIED. Pathology results are CONCORDANT with imaging findings, per Dr. Lovey Newcomer. PATHOLOGY revealed: Site 2. Breast, RIGHT, needle core biopsy, 10 o'clock- INVASIVE DUCTAL CARCINOMA, SEE COMMENT. Microscopic Comment 2. The carcinoma appears grade 3 and measures 12 mm in greatest linear extent. Prognostic makers will be ordered. Pathology results are CONCORDANT with imaging findings, per Dr. Lovey Newcomer. Pathology results and recommendations below were discussed with patient by Stacie Acres RN via telephone on 11/16/2019. Patient reported biopsy site within normal limits with slight tenderness at the site. Post biopsy care instructions were reviewed, questions were answered and my direct phone number was provided to patient. Patient was instructed to call [place] if any concerns or questions arise related to the biopsy. Recommendations: 1. Patient was referred to the [place] [facility] at [facility], with appointment on 11/18/2019. 2. There are associated coarse heterogeneous calcifications with the RIGHT breast mass at 10 o'clock position (3 cm from nipple) which would need excision at time of surgical treatment of the suspected malignancy in the RIGHT breast, 10 o'clock position. 3. Recommend bilateral breast  MRI after completion of biopsy procedures given dense breasts and calcifications. Pathology results reported by Electa Sniff RN on 11/16/2019. Electronically Signed   By: Lovey Newcomer M.D.   On: 11/16/2019 14:02   Result Date: 11/17/2019 CLINICAL DATA:  Patient with indeterminate right breast mass 9:30 o'clock and 10 o'clock position. EXAM: ULTRASOUND GUIDED RIGHT BREAST CORE NEEDLE BIOPSY COMPARISON:  Previous exam(s). PROCEDURE: I met with the patient and we discussed the procedure of ultrasound-guided biopsy, including benefits and alternatives. We discussed the high likelihood of a successful procedure. We discussed the risks of the procedure, including infection, bleeding, tissue injury, clip migration, and inadequate sampling. Informed written consent was given. The usual time-out protocol was performed immediately prior to the procedure. Site 1: Right breast mass 9:30 o'clock Lesion quadrant: Upper outer quadrant Using sterile technique and 1% Lidocaine as local anesthetic, under direct ultrasound visualization, a 14 gauge spring-loaded device was used to perform biopsy of right breast mass 9:30 o'clock using a lateral approach. At the conclusion of the procedure ribbon shaped tissue marker clip was deployed into the biopsy cavity. Follow up 2 view mammogram was performed and dictated separately. Site 2: Right breast mass 10 o'clock Lesion quadrant: Upper outer quadrant Using sterile technique and 1% Lidocaine as local anesthetic, under direct ultrasound visualization, a 14 gauge spring-loaded device was used to perform biopsy of  right breast mass 9:30 o'clock using a lateral approach. At the conclusion of the procedure coil shaped tissue marker clip was deployed into the biopsy cavity. Follow up 2 view mammogram was performed and dictated separately. IMPRESSION: Ultrasound guided biopsy of right breast mass 9:30 o'clock and 10 o'clock position. No apparent complications. Electronically Signed: By: Lovey Newcomer M.D. On: 11/12/2019 13:51       IMPRESSION/PLAN: 1. Stage IB, cT1N0M0 grade 3, ER positive invasive ductal carcinoma of the right breast. Dr. Lisbeth Renshaw discusses the pathology findings and reviews the nature of right breast disease. The consensus from the breast conference includes breast conservation with lumpectomy with  sentinel node biopsy.  Dr. Lisbeth Renshaw recommends the rationale for adjuvant external radiotherapy to the breast to reduce the risks of local recurrence, followed by antiestrogen therapy. We discussed the risks, benefits, short, and long term effects of radiotherapy, and the patient is interested in proceeding. Dr. Lisbeth Renshaw discusses the delivery and logistics of radiotherapy and anticipates a course of 4- 6 1/2 weeks of radiotherapy. We will see her back a few weeks after surgery to discuss the simulation process and anticipate we starting radiotherapy about 4-6 weeks after surgery.   In a visit lasting 60 minutes, greater than 50% of the time was spent face to face reviewing her case, as well as in preparation of, discussing, and coordinating the patient's care.  The above documentation reflects my direct findings during this shared patient visit. Please see the separate note by Dr. Lisbeth Renshaw on this date for the remainder of the patient's plan of care.    Carola Rhine, PAC

## 2019-11-19 ENCOUNTER — Encounter: Payer: Self-pay | Admitting: General Practice

## 2019-11-19 ENCOUNTER — Telehealth: Payer: Self-pay | Admitting: Hematology and Oncology

## 2019-11-19 NOTE — Telephone Encounter (Signed)
No 9/29 los, no changes made to pt schedule

## 2019-11-19 NOTE — Progress Notes (Signed)
[facility] Psychosocial Distress Screening Spiritual Care  Met with [name] by phone following Breast Multidisciplinary Clinic to introduce Support Center team/resources, reviewing distress screen per protocol.  The patient scored a 1 on the Psychosocial Distress Thermometer which indicates mild distress. Also assessed for distress and other psychosocial needs.   ONCBCN DISTRESS SCREENING 11/19/2019  Screening Type Initial Screening  Distress experienced in past week (1-10) 1  Emotional problem type Nervousness/Anxiety  Physical Problem type Loss of appetitie;Skin dry/itchy  Referral to support programs Yes   [name] was very appreciative of follow-up call. She notes that she has minimal distress, particularly now that much of her uncertainty was resolved by [facility]. Her faith is a strong coping and meaning-making tool that keeps her from experiencing more anxiety.  Follow up needed: No. Per Abigail Mcgaughy, no other needs at this time, but she knows to contact team whenever needed/desired.   Chaplain Lisa Lundeen, MDiv, [facility] Pager [phone] Voicemail [phone]      

## 2019-11-20 ENCOUNTER — Encounter: Payer: Self-pay | Admitting: *Deleted

## 2019-11-23 ENCOUNTER — Other Ambulatory Visit: Payer: Self-pay | Admitting: Surgery

## 2019-11-23 DIAGNOSIS — C50911 Malignant neoplasm of unspecified site of right female breast: Secondary | ICD-10-CM

## 2019-11-23 DIAGNOSIS — Z170 Estrogen receptor positive status [ER+]: Secondary | ICD-10-CM

## 2019-11-24 ENCOUNTER — Telehealth: Payer: Self-pay | Admitting: *Deleted

## 2019-11-24 ENCOUNTER — Encounter: Payer: Self-pay | Admitting: *Deleted

## 2019-11-24 DIAGNOSIS — C50411 Malignant neoplasm of upper-outer quadrant of right female breast: Secondary | ICD-10-CM

## 2019-11-24 NOTE — Progress Notes (Signed)
Nutrition  Patient identified by attending Breast Clinic on 11/18/19. Patient was given nutrition packet with RD contact information by nurse navigator  79 year old female with new diagnosis of breast cancer.  Planning lumpectomy, radiation and antiestrogens  Ht: 66 inches Wt: 138 lb BMI: 21  Patient is currently not at nutritional risk.  Please consult RD if changes occur in nutritional status.   Shermon Bozzi B. Zenia Resides, [place], [place] Registered Dietitian [phone] (mobile)

## 2019-11-24 NOTE — Telephone Encounter (Signed)
Spoke to pt concerning [place] from 9.29.21. Denies questions or concerns regarding dx or treatment care plan. Confirmed MRI appt for 10/6. Scheduled and confirmed post op with Dr. Lindi Adie on 11/1 at 9:45am. Encourage pt to call with needs. Received verbal understanding.

## 2019-11-25 ENCOUNTER — Ambulatory Visit (HOSPITAL_COMMUNITY): Admission: RE | Admit: 2019-11-25 | Payer: Medicare HMO | Source: Ambulatory Visit

## 2019-11-26 ENCOUNTER — Encounter: Payer: Self-pay | Admitting: *Deleted

## 2019-11-27 ENCOUNTER — Encounter: Payer: Self-pay | Admitting: Internal Medicine

## 2019-11-27 ENCOUNTER — Ambulatory Visit: Payer: Medicare HMO | Admitting: Internal Medicine

## 2019-11-27 ENCOUNTER — Ambulatory Visit (INDEPENDENT_AMBULATORY_CARE_PROVIDER_SITE_OTHER): Payer: Medicare HMO

## 2019-11-27 ENCOUNTER — Other Ambulatory Visit: Payer: Self-pay

## 2019-11-27 VITALS — BP 150/70 | HR 88 | Temp 97.7°F | Ht 67.0 in | Wt 137.6 lb

## 2019-11-27 DIAGNOSIS — J310 Chronic rhinitis: Secondary | ICD-10-CM | POA: Diagnosis not present

## 2019-11-27 DIAGNOSIS — J449 Chronic obstructive pulmonary disease, unspecified: Secondary | ICD-10-CM

## 2019-11-27 DIAGNOSIS — J479 Bronchiectasis, uncomplicated: Secondary | ICD-10-CM

## 2019-11-27 IMAGING — DX DG CHEST 2V
2 series · 2 of 2 positions shown · non-contrast
Comparison: [DATE], [DATE]

CLINICAL DATA: COPD

EXAM:
CHEST - 2 VIEW

[chest pa]
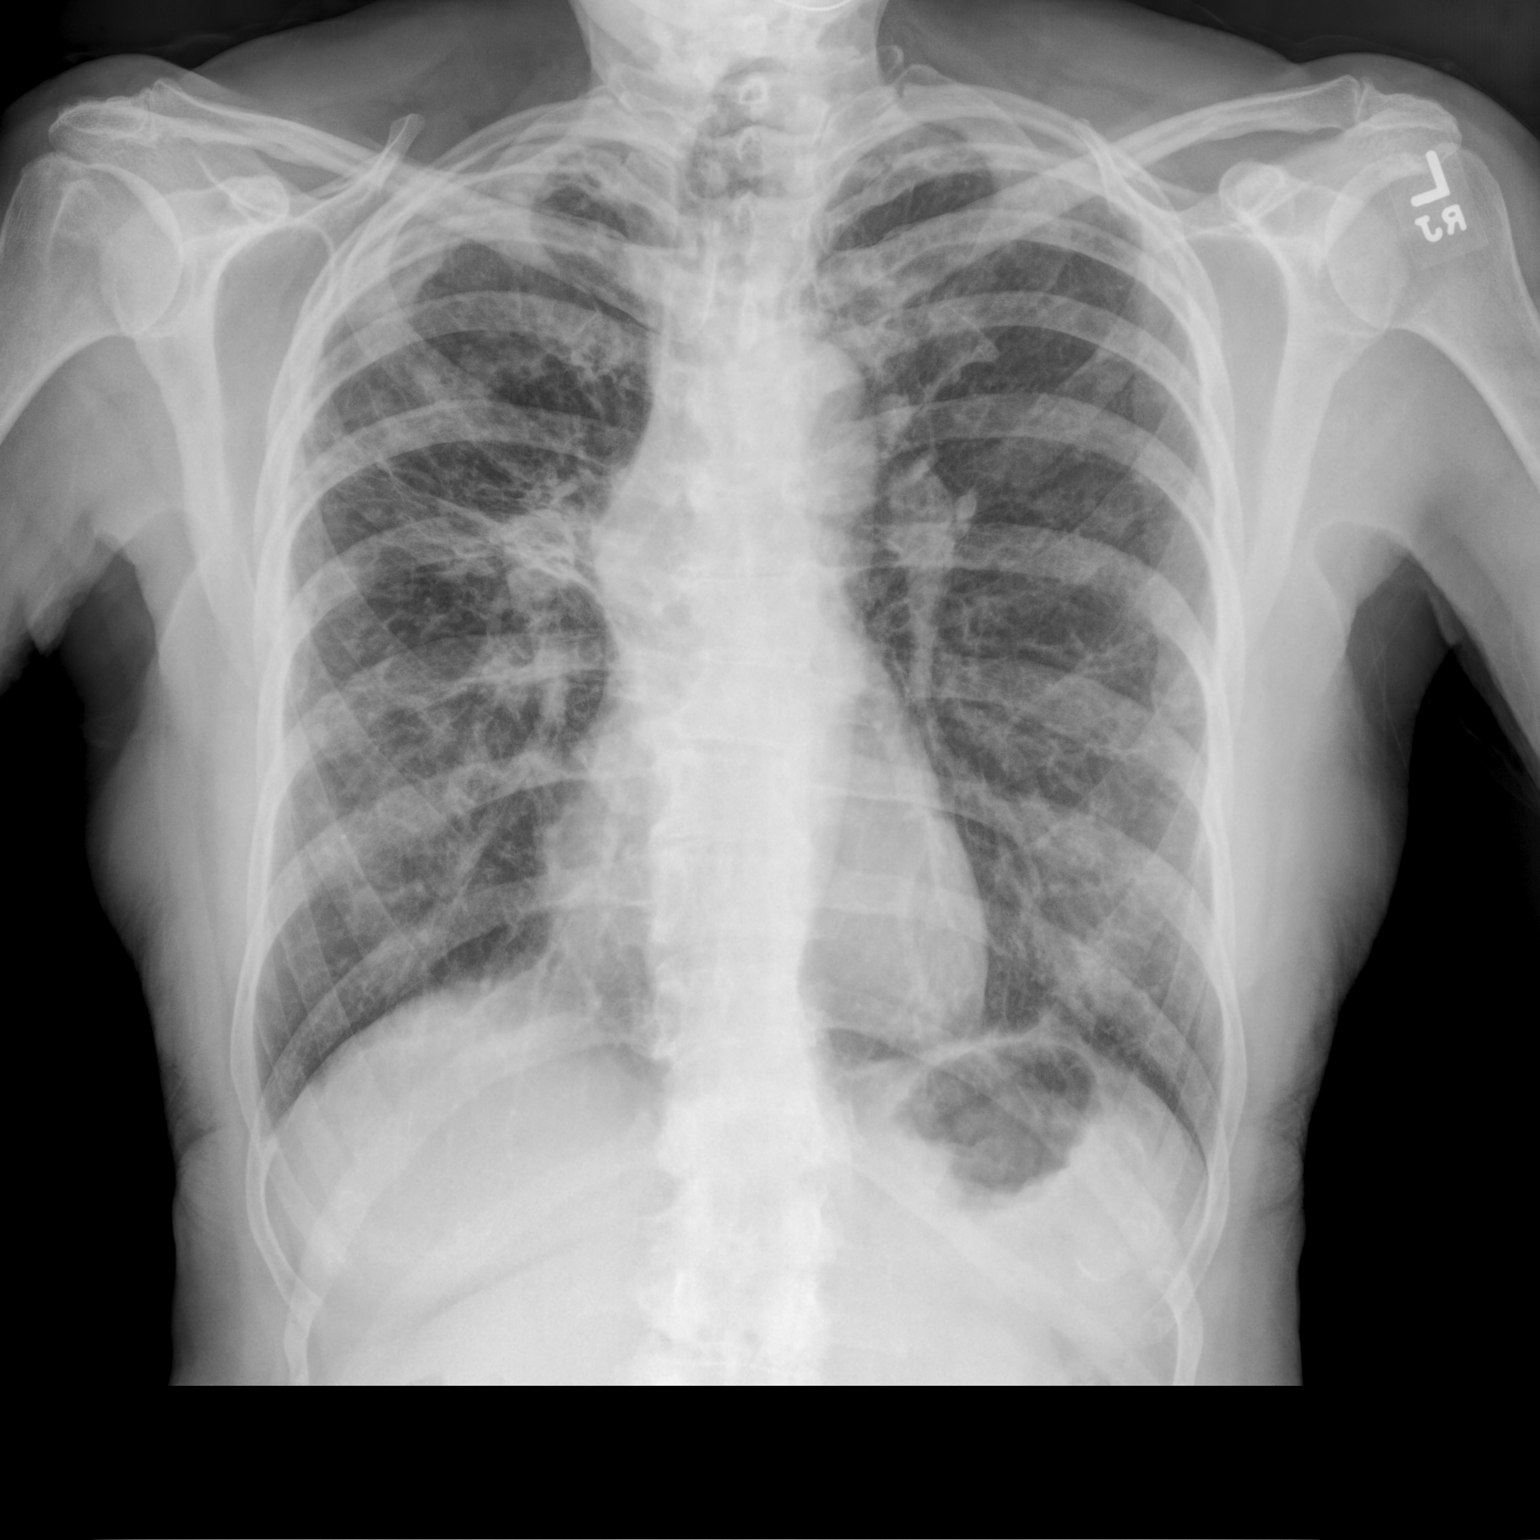

[chest lat]
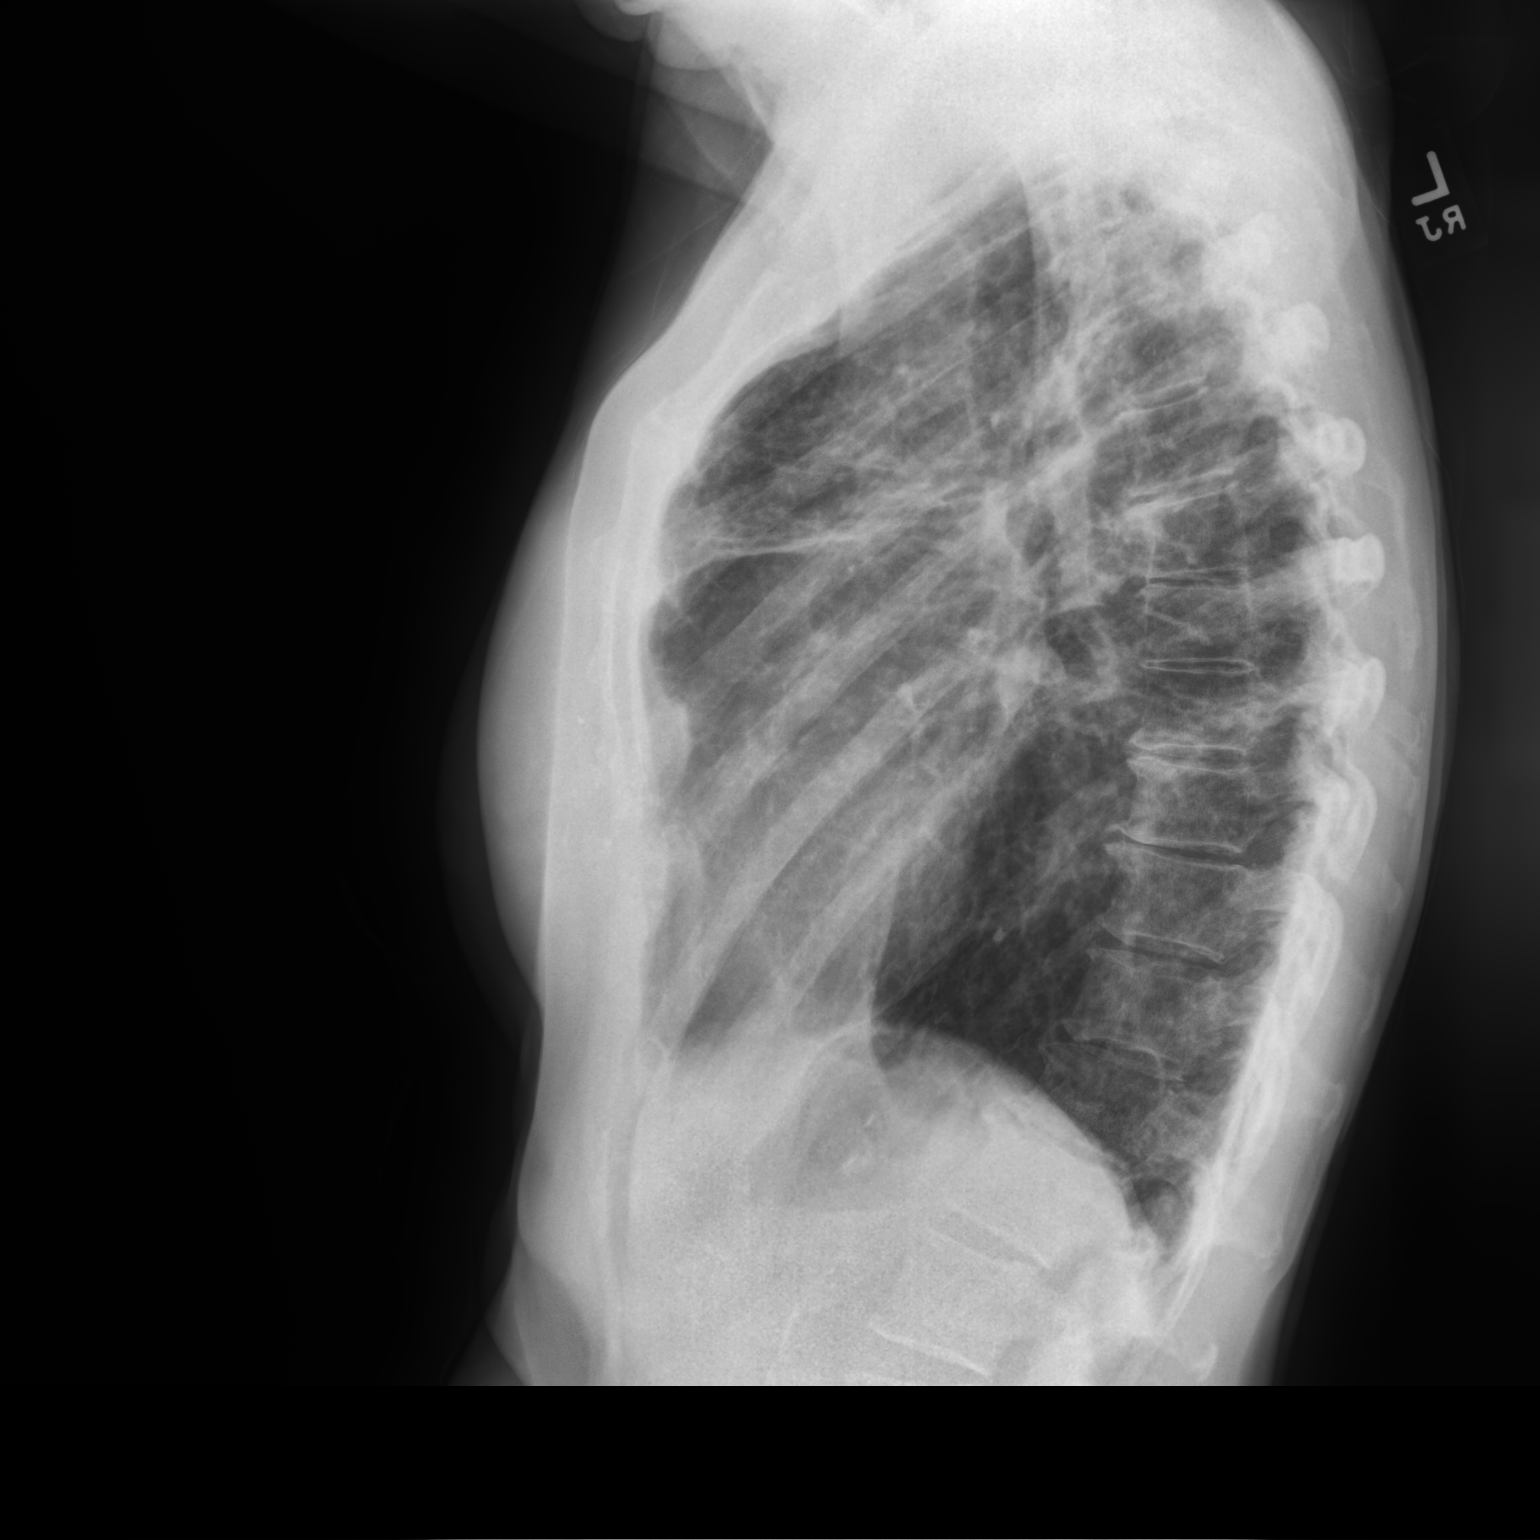

[2 of 2 positions shown; findings below may reference images not displayed]

FINDINGS: Biapical scarring with bronchiectasis. Slight increased opacity at
the right hilar region with patchy opacity at the left base. Normal
heart size. No pneumothorax.
IMPRESSION: Biapical scarring with bilateral bronchiectasis. Possible acute
superimposed airspace opacities in the right hilar region and left
base, suggest short interval radiographic follow-up.

## 2019-11-27 MED ORDER — TRELEGY ELLIPTA 100-62.5-25 MCG/INH IN AEPB: 1.0000 | INHALATION_SPRAY | Freq: Every day | RESPIRATORY_TRACT | 0 refills | Status: DC

## 2019-11-27 NOTE — Progress Notes (Signed)
Patient ID: [name], female    DOB: 1941/01/11, 79 y.o.   MRN: [id number]  HPI  F never smoker with hx bronchitis/bronchiectasis and chronic recurrent hemoptysis,  + MAIC.. 09/12/12.complicated by sinusitis, Nocardia,  glaucoma, hx sinusistis  Rx: 02/10/13- zith 500 mg TIW, EMB 1200 TIW, Rif 300 mg TIW. Ended by ID. Nocardia Rx'd Bactrim x 1 year Office spirometry 06/29/2014-moderate obstructive airways disease, FVC 2.17/69%, FEV1 1.39/60%, FEV1/FVC 64%, FEF 25-75 percent 0.75/4  ----------------------------------------------------------------------   11/27/2018- 79 year old female never smoker followed for chronic bronchitis/bronchiectasis/ MAIC, chronic recurrent hemoptysis, Glaucoma, positive MAIC 88/91/6945 complicated by Nocardia, history sinusitis Rx: 02/10/13- zith 500 mg TIW, EMB 1200 TIW, Rif 300 mg TIW. Ended by ID. Nocardia Rx'd Bactrim x 1 year -----pt states her breathing varies, reports taking SpO2 at home that run in the low 90%s, denies being on home O2 Advair 250, albuterol HFA, Flonase/ Dymista,  Out of her inhalers. Had flu vax. Felt more congested in Spring season. Currently little cough or phlegm, no fever/ sweat.  Discussed last CT. CT chest 07/11/2018-  IMPRESSION: 1. 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 2. Dominant right lower lobe nodule is stable. If additional follow-up is desired, CT chest without contrast in 1 year could be performed. 3. Aortic atherosclerosis (ICD10-170.0). Coronary artery Calcification.   11/27/19- 79 year old female never smoker followed for COPD/ chronic bBonchitis/Bronchiectasis/ MAIC, chronic recurrent hemoptysis, Allergic Rhinitis,  Glaucoma, Hypothyroid, Breast Cancer R,   positive MAIC 03/88/8280 complicated by Nocardia, history sinusitis Rx: 02/10/13- zith 500 mg TIW, EMB 1200 TIW, Rif 300  mg TIW. Ended by ID. Nocardia Rx'd Bactrim x 1 year Advair 250,- doesn't remember albuterol HFA, Flonase/ Dymista,  Pending R breast lumpectomy/ Rx for Breast Cancer Covid vax- 3 Phizer Flu vax- done Stable dyspnea on exertion- hills and stairs. Occasional cough, mostly dry. No fever, blood or acute events. Fall season is usually her worst as leaves fall. Nasal stuffiness has begun. Only using occasional Ventolin inhaler. She doesn't remember Advair.  Review of Systems-see HPI   + = positive Constitutional:   No-   weight loss, night sweats, fevers, chills, fatigue, lassitude. HEENT:   No-  headaches, difficulty swallowing, tooth/dental problems, sore throat,       No- sneezing, no-itching, ear ache, +congestion, post nasal drip+,  CV:  No-   chest pain, orthopnea, PND, swelling in lower extremities, anasarca, dizziness, palpitations Resp: +  shortness of breath with exertion or at rest.             +productive cough,  + non-productive cough,  No- recent coughing up of blood.               change in color of mucus.  No- wheezing.   Skin: No-   rash or lesions. GI:  No-   heartburn, indigestion, abdominal pain, nausea, vomiting,  GU:  MS:  No-   joint pain or swelling.  . Neuro-     nothing unusual Psych:  No- change in mood or affect. No depression or anxiety.  No memory loss.  Objective:   Physical Exam General- Alert, Oriented, Affect-cheerful, Distress- none acute. Trim. Looks well. Skin- rash-none, lesions- none, excoriation- none Lymphadenopathy- none Head- atraumatic            Eyes- Gross vision intact, PERRLA, conjunctivae clear secretions            Ears- Hearing, canals-normal  Nose- + stuffy, no-Septal dev, mucus, polyps, erosion, perforation             Throat- Mallampati II , mucosa clear , drainage- none, tonsils-  atrophic Neck- flexible , trachea midline, no stridor , thyroid nl, carotid no bruit Chest - symmetrical excursion , unlabored           Heart/CV-  RRR/ occ extra beat , no murmur , no gallop  , no rub, nl s1 s2                           - JVD- none , edema- none, stasis changes- none, varices- none           Lung- + few crackles L base, unlabored, cough+ slight,  wheeze- none, dullness-none, rub- none           Chest wall-  Abd-  Br/ Gen/ Rectal- Not done, not indicated Extrem- cyanosis- none, clubbing, none, atrophy- none, strength- nl.  Neuro- grossly intact to observation

## 2019-11-27 NOTE — Assessment & Plan Note (Signed)
Symptoms and exam are stable. Would prefer to stay off LAMA class due to glaucoma, but we have only limited samples today. Plan- Try sample Trelegy 100 to see if it helps breathing, CXR

## 2019-11-27 NOTE — Assessment & Plan Note (Signed)
Seasonal exacerbation Plan- start regular use of Flonase daily while needed

## 2019-11-27 NOTE — Patient Instructions (Signed)
Sample x 3 Breo 100     If available     Inhale 1 puff, then rinse mouth, once daily  Order- CXR    Dx COPD mixed type  Suggest you start otc Flonase nasal spray  1-2 puffs each nostril every night at bedtime while needed  Please cal if we can help

## 2019-11-30 ENCOUNTER — Ambulatory Visit: Payer: Medicare HMO

## 2019-11-30 ENCOUNTER — Telehealth: Payer: Self-pay | Admitting: Internal Medicine

## 2019-11-30 ENCOUNTER — Other Ambulatory Visit: Payer: Self-pay | Admitting: Internal Medicine

## 2019-11-30 DIAGNOSIS — J479 Bronchiectasis, uncomplicated: Secondary | ICD-10-CM

## 2019-11-30 MED ORDER — AZITHROMYCIN 250 MG PO TABS: ORAL_TABLET | ORAL | 0 refills | Status: DC

## 2019-11-30 NOTE — Telephone Encounter (Signed)
CXR- shows some hazy areas. Infection can't be ruled out. I will send script for Zpak to her drug store and we will repeat the CXR in a month  Order- future CXR in 1 month  dx Bronchectasis  Left VM for patient to return call regarding above.  Will await return call.

## 2019-11-30 NOTE — Progress Notes (Signed)
Called and left message to return call. 

## 2019-12-01 ENCOUNTER — Other Ambulatory Visit: Payer: Self-pay | Admitting: Internal Medicine

## 2019-12-01 ENCOUNTER — Telehealth: Payer: Self-pay

## 2019-12-01 DIAGNOSIS — J479 Bronchiectasis, uncomplicated: Secondary | ICD-10-CM

## 2019-12-01 NOTE — Telephone Encounter (Signed)
Pt was left vm to return call to get cxr results

## 2019-12-01 NOTE — Telephone Encounter (Signed)
Tried calling patient several times so a letter was sent out

## 2019-12-01 NOTE — Progress Notes (Signed)
[place], [name] - [address] [place] [name] [address] Phone: [phone] Fax: [phone]      Your procedure is scheduled on Wednesday October 20th.  Report to [facility] Main Entrance "A" at 6:30 A.M., and check in at the Admitting office.  Call this number if you have problems the morning of surgery:  [phone]  Call [phone] if you have any questions prior to your surgery date Monday-Friday 8am-4pm    Remember:  Do not eat or drink after midnight the night before your surgery    Take these medicines the morning of surgery with A SIP OF WATER   azithromycin (ZITHROMAX Z-PAK) 250 MG tablet  AZOPT 1 % ophthalmic suspension   citalopram (CELEXA) 20 MG tablet  dorzolamide (TRUSOPT) 2 % ophthalmic solution  Fluticasone-Umeclidin-Vilant (TRELEGY ELLIPTA) 100-62.5-25 MCG/INH AEPB  levothyroxine (SYNTHROID, LEVOTHROID) 88 MCG tablet  rosuvastatin (CRESTOR) 5 MG tablet    IF NEEDED  albuterol (VENTOLIN HFA) 108 (90 Base) MCG/ACT inhaler  benzonatate (TESSALON) 100 MG capsule    fluticasone (FLONASE) 50 MCG/ACT nasal spray  Naproxen Sodium (ALEVE) 220 MG CAPS  As of today, STOP taking any Aspirin (unless otherwise instructed by your surgeon) Aleve, Naproxen, Ibuprofen, Motrin, Advil, Goody's, BC's, all herbal medications, fish oil, and all vitamins.                      Do not wear jewelry, make up, or nail polish            Do not wear lotions, powders, perfumes, or deodorant.            Do not shave 48 hours prior to surgery.  Men may shave face and neck.            Do not bring valuables to the hospital.            [facility] is not responsible for any belongings or valuables.  Do NOT Smoke (Tobacco/Vaping) or drink Alcohol 24 hours prior to your procedure If you use a CPAP at night, you may bring all equipment for your overnight stay.   Contacts, glasses, dentures or bridgework may not be worn into surgery.       For patients admitted to the hospital, discharge time will be determined by your treatment team.   Patients discharged the day of surgery will not be allowed to drive home, and someone needs to stay with them for 24 hours.    Special instructions:   [place]- Preparing For Surgery  Before surgery, you can play an important role. Because skin is not sterile, your skin needs to be as free of germs as possible. You can reduce the number of germs on your skin by washing with CHG (chlorahexidine gluconate) Soap before surgery.  CHG is an antiseptic cleaner which kills germs and bonds with the skin to continue killing germs even after washing.    Oral Hygiene is also important to reduce your risk of infection.  Remember - BRUSH YOUR TEETH THE MORNING OF SURGERY WITH YOUR REGULAR TOOTHPASTE  Please do not use if you have an allergy to CHG or antibacterial soaps. If your skin becomes reddened/irritated stop using the CHG.  Do not shave (including legs and underarms) for at least 48 hours prior to first CHG shower. It is OK to shave your face.  Please follow these instructions carefully.   1. Shower the NIGHT BEFORE SURGERY and the MORNING OF  SURGERY with CHG Soap.   2. If you chose to wash your hair, wash your hair first as usual with your normal shampoo.  3. After you shampoo, rinse your hair and body thoroughly to remove the shampoo.  4. Use CHG as you would any other liquid soap. You can apply CHG directly to the skin and wash gently with a scrungie or a clean washcloth.   5. Apply the CHG Soap to your body ONLY FROM THE NECK DOWN.  Do not use on open wounds or open sores. Avoid contact with your eyes, ears, mouth and genitals (private parts). Wash Face and genitals (private parts)  with your normal soap.   6. Wash thoroughly, paying special attention to the area where your surgery will be performed.  7. Thoroughly rinse your body with warm water from the neck down.  8. DO NOT  shower/wash with your normal soap after using and rinsing off the CHG Soap.  9. Pat yourself dry with a CLEAN TOWEL.  10. Wear CLEAN PAJAMAS to bed the night before surgery  11. Place CLEAN SHEETS on your bed the night of your first shower and DO NOT SLEEP WITH PETS.   Day of Surgery: Wear Clean/Comfortable clothing the morning of surgery Do not apply any deodorants/lotions.   Remember to brush your teeth WITH YOUR REGULAR TOOTHPASTE.   Please read over the following fact sheets that you were given.

## 2019-12-02 ENCOUNTER — Ambulatory Visit (HOSPITAL_COMMUNITY): Admission: RE | Admit: 2019-12-02 | Payer: Medicare HMO | Source: Ambulatory Visit

## 2019-12-02 ENCOUNTER — Encounter (HOSPITAL_COMMUNITY)
Admission: RE | Admit: 2019-12-02 | Discharge: 2019-12-02 | Disposition: A | Discharge status: Home / Self Care | Payer: Medicare HMO | Source: Ambulatory Visit | Attending: Surgery | Admitting: Surgery

## 2019-12-02 ENCOUNTER — Encounter (HOSPITAL_COMMUNITY): Payer: Self-pay

## 2019-12-02 ENCOUNTER — Other Ambulatory Visit: Payer: Self-pay

## 2019-12-02 ENCOUNTER — Encounter (HOSPITAL_COMMUNITY): Payer: Self-pay | Admitting: Physician Assistant

## 2019-12-02 DIAGNOSIS — C50911 Malignant neoplasm of unspecified site of right female breast: Secondary | ICD-10-CM | POA: Diagnosis not present

## 2019-12-02 DIAGNOSIS — Z01818 Encounter for other preprocedural examination: Secondary | ICD-10-CM | POA: Diagnosis not present

## 2019-12-02 DIAGNOSIS — Z170 Estrogen receptor positive status [ER+]: Secondary | ICD-10-CM | POA: Insufficient documentation

## 2019-12-02 LAB — CBC WITH DIFFERENTIAL/PLATELET
Abs Immature Granulocytes: 0.01 10*3/uL (ref 0.00–0.07)
Basophils Absolute: 0.1 10*3/uL (ref 0.0–0.1)
Basophils Relative: 1 %
Eosinophils Absolute: 0.2 10*3/uL (ref 0.0–0.5)
Eosinophils Relative: 4 %
HCT: 44.5 % (ref 36.0–46.0)
Hemoglobin: 14 g/dL (ref 12.0–15.0)
Immature Granulocytes: 0 %
Lymphocytes Relative: 30 %
Lymphs Abs: 1.5 10*3/uL (ref 0.7–4.0)
MCH: 31.1 pg (ref 26.0–34.0)
MCHC: 31.5 g/dL (ref 30.0–36.0)
MCV: 98.9 fL (ref 80.0–100.0)
Monocytes Absolute: 0.6 10*3/uL (ref 0.1–1.0)
Monocytes Relative: 11 %
Neutro Abs: 2.7 10*3/uL (ref 1.7–7.7)
Neutrophils Relative %: 54 %
Platelets: 201 10*3/uL (ref 150–400)
RBC: 4.5 MIL/uL (ref 3.87–5.11)
RDW: 13.1 % (ref 11.5–15.5)
WBC: 5.1 10*3/uL (ref 4.0–10.5)
nRBC: 0 % (ref 0.0–0.2)

## 2019-12-02 LAB — COMPREHENSIVE METABOLIC PANEL
ALT: 17 U/L (ref 0–44)
AST: 25 U/L (ref 15–41)
Albumin: 3.9 g/dL (ref 3.5–5.0)
Alkaline Phosphatase: 80 U/L (ref 38–126)
Anion gap: 7 (ref 5–15)
BUN: 12 mg/dL (ref 8–23)
CO2: 29 mmol/L (ref 22–32)
Calcium: 9.6 mg/dL (ref 8.9–10.3)
Chloride: 103 mmol/L (ref 98–111)
Creatinine, Ser: 0.84 mg/dL (ref 0.44–1.00)
GFR, Estimated: >60 mL/min (ref >60)
Glucose, Bld: 77 mg/dL (ref 70–99)
Potassium: 3.7 mmol/L (ref 3.5–5.1)
Sodium: 139 mmol/L (ref 135–145)
Total Bilirubin: 0.6 mg/dL (ref 0.3–1.2)
Total Protein: 7.6 g/dL (ref 6.5–8.1)

## 2019-12-02 NOTE — Progress Notes (Signed)
[place], [name] - [address] [place] [name] [address] Phone: [phone] Fax: [phone]      Your procedure is scheduled on October 20  Report to [facility] Main Entrance "A" at [place].M., and check in at the Admitting office.  Call this number if you have problems the morning of surgery:  [phone]  Call [phone] if you have any questions prior to your surgery date Monday-Friday 8am-4pm    Remember:  Do not eat after midnight the night before your surgery  You may drink clear liquids until 0530 am the morning of your surgery.   Clear liquids allowed are: Water, Non-Citrus Juices (without pulp), Carbonated Beverages, Clear Tea, Black Coffee Only, and Gatorade    Take these medicines the morning of surgery with A SIP OF WATER albuterol (VENTOLIN HFA) if needed, Please bring all inhalers with you the day of surgery.  azithromycin (ZITHROMAX Z-PAK) citalopram (CELEXA) Eye drops if needed fluticasone (FLONASE) if needed Fluticasone-Umeclidin-Vilant (TRELEGY ELLIPTA)  levothyroxine (SYNTHROID, LEVOTHROID) rosuvastatin (CRESTOR)    As of today, STOP taking any Aspirin (unless otherwise instructed by your surgeon) Aleve, Naproxen, Ibuprofen, Motrin, Advil, Goody's, BC's, all herbal medications, fish oil, and all vitamins.                      Do not wear jewelry, make up, or nail polish            Do not wear lotions, powders, perfumes, or deodorant.            Do not shave 48 hours prior to surgery.             Do not bring valuables to the hospital.            [facility] is not responsible for any belongings or valuables.  Do NOT Smoke (Tobacco/Vaping) or drink Alcohol 24 hours prior to your procedure If you use a CPAP at night, you may bring all equipment for your overnight stay.   Contacts, glasses, dentures or bridgework may not be worn into surgery.      For patients admitted to the hospital, discharge  time will be determined by your treatment team.   Patients discharged the day of surgery will not be allowed to drive home, and someone needs to stay with them for 24 hours.    Special instructions:   [place]- Preparing For Surgery  Before surgery, you can play an important role. Because skin is not sterile, your skin needs to be as free of germs as possible. You can reduce the number of germs on your skin by washing with CHG (chlorahexidine gluconate) Soap before surgery.  CHG is an antiseptic cleaner which kills germs and bonds with the skin to continue killing germs even after washing.    Oral Hygiene is also important to reduce your risk of infection.  Remember - BRUSH YOUR TEETH THE MORNING OF SURGERY WITH YOUR REGULAR TOOTHPASTE  Please do not use if you have an allergy to CHG or antibacterial soaps. If your skin becomes reddened/irritated stop using the CHG.  Do not shave (including legs and underarms) for at least 48 hours prior to first CHG shower. It is OK to shave your face.  Please follow these instructions carefully.   1. Shower the NIGHT BEFORE SURGERY and the MORNING OF SURGERY with CHG Soap.   2. If you chose to wash your hair, wash your hair first as  usual with your normal shampoo.  3. After you shampoo, rinse your hair and body thoroughly to remove the shampoo.  4. Use CHG as you would any other liquid soap. You can apply CHG directly to the skin and wash gently with a scrungie or a clean washcloth.   5. Apply the CHG Soap to your body ONLY FROM THE NECK DOWN.  Do not use on open wounds or open sores. Avoid contact with your eyes, ears, mouth and genitals (private parts). Wash Face and genitals (private parts)  with your normal soap.   6. Wash thoroughly, paying special attention to the area where your surgery will be performed.  7. Thoroughly rinse your body with warm water from the neck down.  8. DO NOT shower/wash with your normal soap after using and rinsing  off the CHG Soap.  9. Pat yourself dry with a CLEAN TOWEL.  10. Wear CLEAN PAJAMAS to bed the night before surgery  11. Place CLEAN SHEETS on your bed the night of your first shower and DO NOT SLEEP WITH PETS.   Day of Surgery: Wear Clean/Comfortable clothing the morning of surgery Do not apply any deodorants/lotions.   Remember to brush your teeth WITH YOUR REGULAR TOOTHPASTE.   Please read over the following fact sheets that you were given.

## 2019-12-02 NOTE — Progress Notes (Addendum)
PCP - Jonathon Jordan Cardiologist -  [place]  Chest x-ray - 11/27/19 EKG - 12/02/19 Stress Test - denies ECHO - denies Cardiac Cath - denies  ERAS Protcol - yes clear liquids until 0530 am PRE-SURGERY Ensure or G2- ensure not ordered  COVID TEST- 12/05/19, educated to quarantine after test   Anesthesia review: Jeneen Rinks to evaluate patient at PAT appt, see pulmonary notes Antibiotic started: patient has not started, patient knows that she needs to go pick up prescription at [facility].  Patient keeps an eye on her pulse o2 at home never above 95 usually between 90-95   Patient denies shortness of breath, fever, cough and chest pain at PAT appointment   All instructions explained to the patient, with a verbal understanding of the material. Patient agrees to go over the instructions while at home for a better understanding. Patient also instructed to self quarantine after being tested for COVID-19. The opportunity to ask questions was provided.

## 2019-12-03 ENCOUNTER — Encounter: Payer: Self-pay | Admitting: *Deleted

## 2019-12-03 ENCOUNTER — Ambulatory Visit (HOSPITAL_COMMUNITY)
Admission: RE | Admit: 2019-12-03 | Discharge: 2019-12-03 | Disposition: A | Discharge status: Home / Self Care | Payer: Medicare HMO | Source: Ambulatory Visit | Attending: Hematology and Oncology | Admitting: Hematology and Oncology

## 2019-12-03 DIAGNOSIS — N6011 Diffuse cystic mastopathy of right breast: Secondary | ICD-10-CM | POA: Diagnosis not present

## 2019-12-03 DIAGNOSIS — Z170 Estrogen receptor positive status [ER+]: Secondary | ICD-10-CM | POA: Diagnosis not present

## 2019-12-03 DIAGNOSIS — C50411 Malignant neoplasm of upper-outer quadrant of right female breast: Secondary | ICD-10-CM | POA: Diagnosis not present

## 2019-12-03 IMAGING — MR MR BREAST BILAT WO/W CM
7 of 11 series · 28 of 48 positions shown · IV contrast (gadavist)
Comparison: Previous examinations, including the recent mammogram,
ultrasound and biopsy examinations.

CLINICAL DATA: Recently diagnosed grade 3 invasive ductal carcinoma
in the 10 o'clock position of the right breast. Recently diagnosed
fibrocystic change and pseudoangiomatous stromal hyperplasia in the
9:30 o'clock position of the right breast. The patient also has
extremely dense breast tissue and calcifications.

LABS:  None obtained on site today.
EXAM:
BILATERAL BREAST MRI WITH AND WITHOUT CONTRAST
TECHNIQUE: Multiplanar, multisequence MR images of both breasts were obtained
prior to and following the intravenous administration of 6 ml of
Gadavist

[Series 2: T2 · axial · 3.0mm · 0.76mm/px · z∈[-78,+87]mm · 4 of 56 slices shown (1 of 2)]
[im 1/56]
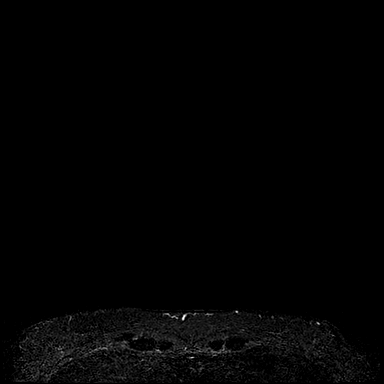
[im 19/56]
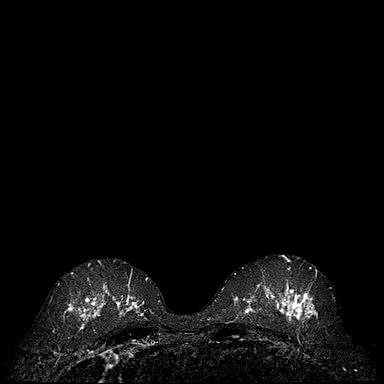
[im 37/56]
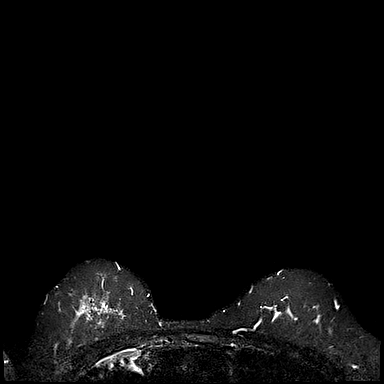
[im 56/56]
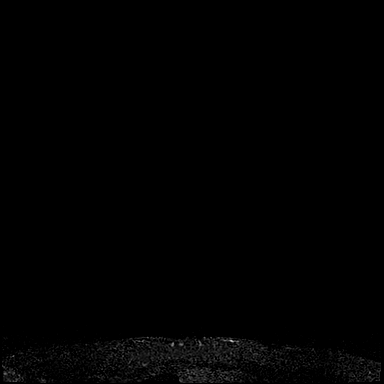

[Series 3: T1 fat-sat · axial · 1.2mm · 0.71mm/px · z∈[-57,+67]mm · 5 of 104 slices shown (1 of 4)]
[im 1/104]
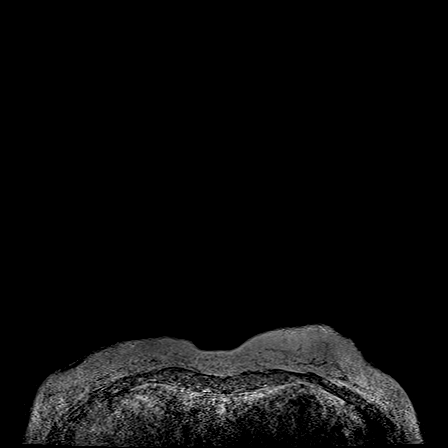
[im 26/104]
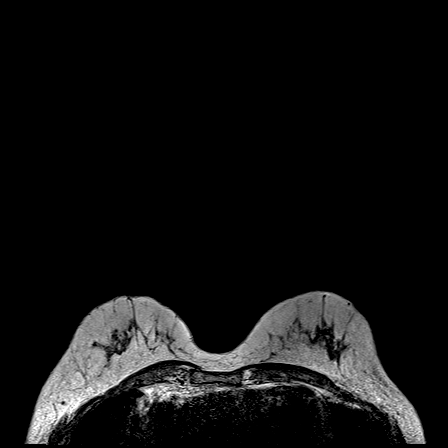
[im 52/104]
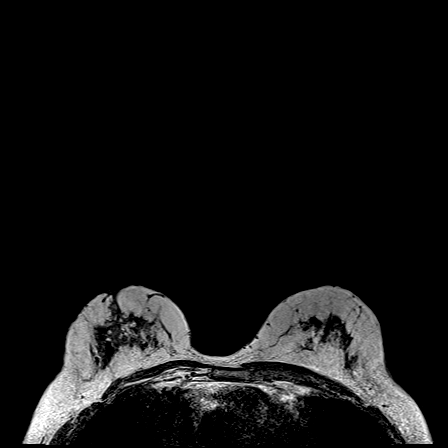
[im 78/104]
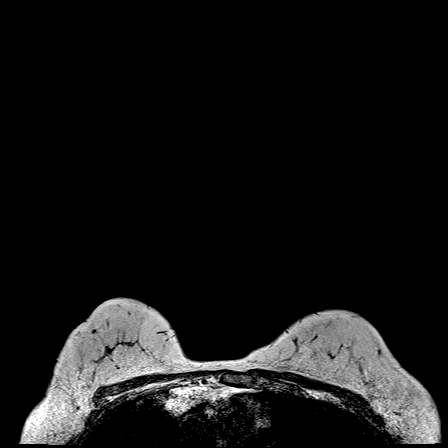
[im 104/104]
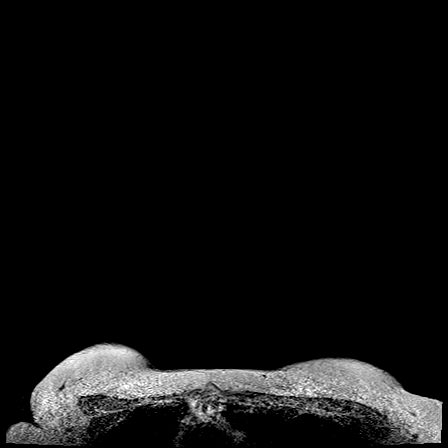

[Series 4: T2 · axial · 3.0mm · 0.76mm/px · z∈[-78,+87]mm · 3 of 56 slices shown (2 of 2)]
[im 1/56]
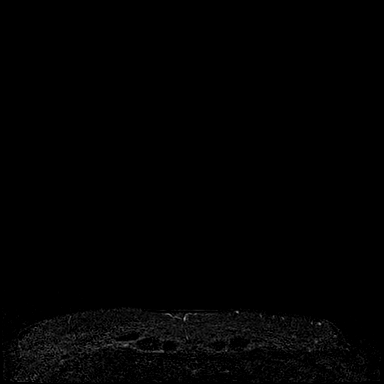
[im 28/56]
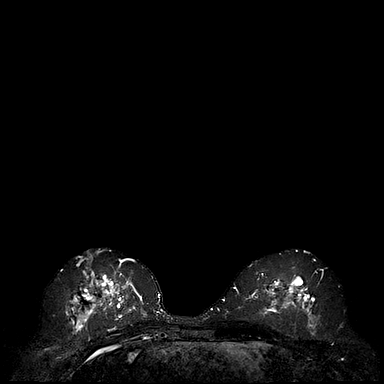
[im 56/56]
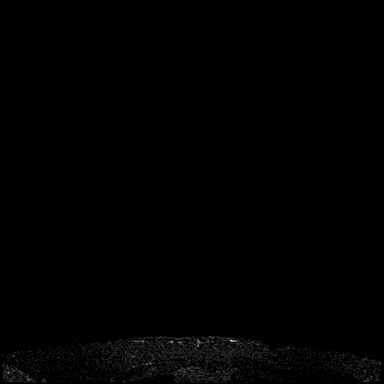

[Series 6: T1 fat-sat · axial · 1.6mm · 0.70mm/px · z∈[-83,+95]mm · 5 of 112 slices shown (2 of 4)]
[im 1/112]
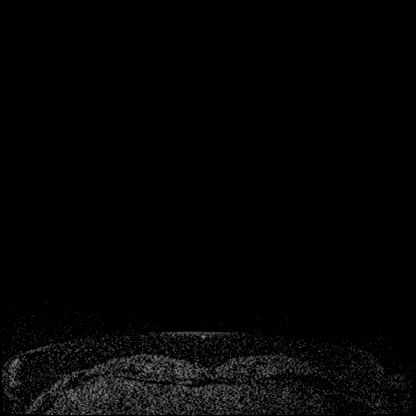
[im 28/112]
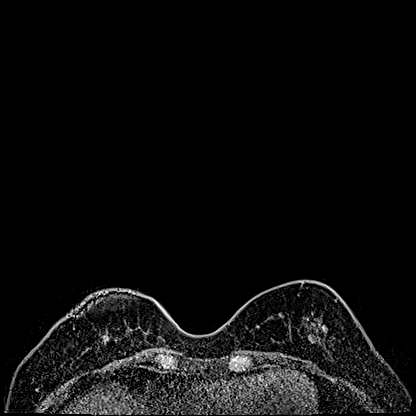
[im 56/112]
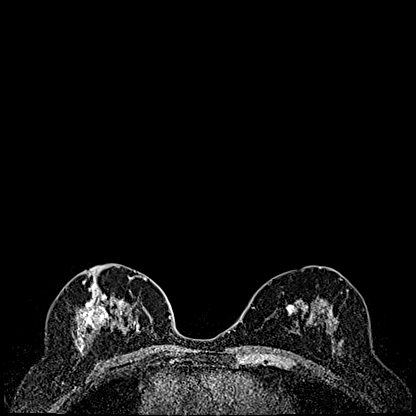
[im 84/112]
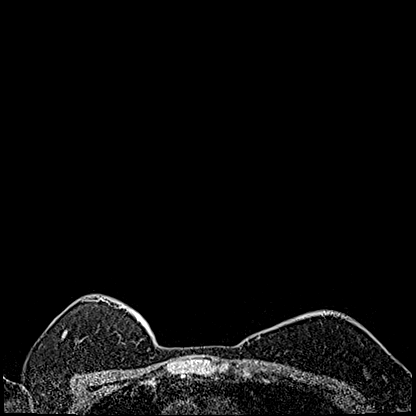
[im 112/112]
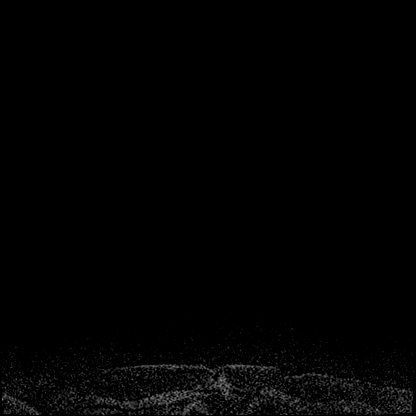

[Series 7: T1 fat-sat · axial · 1.6mm · 0.70mm/px · z∈[-83,+95]mm · 5 of 112 slices shown (3 of 4)]
[im 1/112]
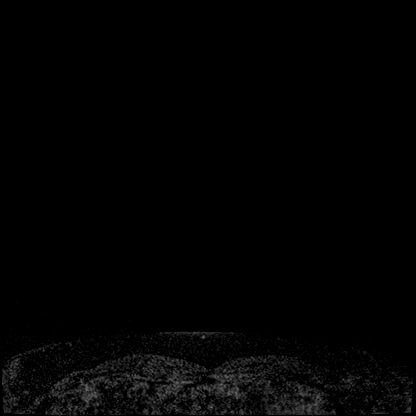
[im 28/112]
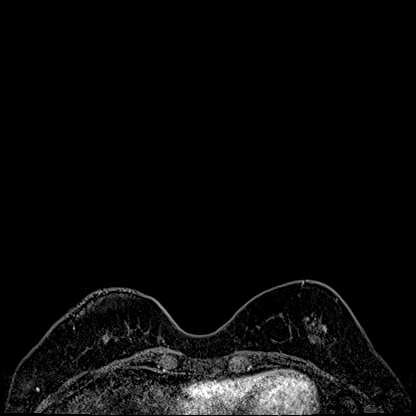
[im 56/112]
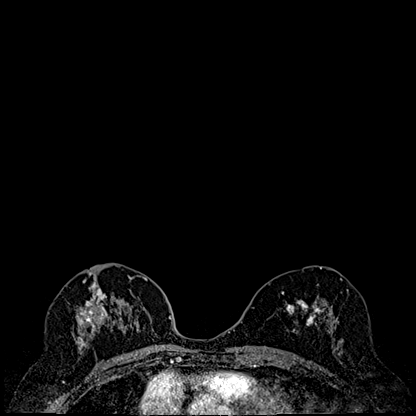
[im 84/112]
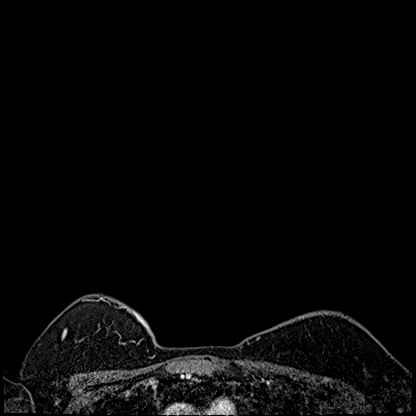
[im 112/112]
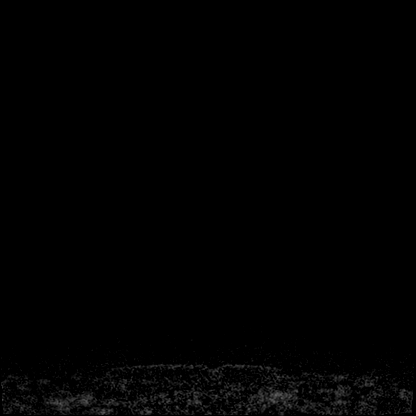

[Series 8: T1 · axial · 1.6mm · 0.70mm/px · z∈[-83,+95]mm · 5 of 112 slices shown]
[im 1/112]
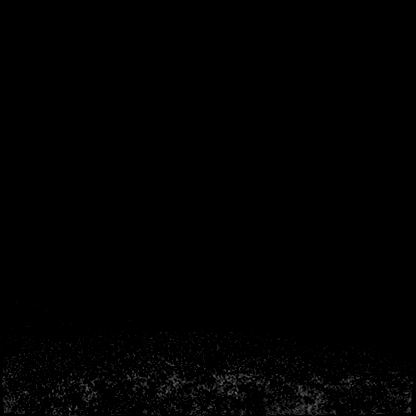
[im 28/112]
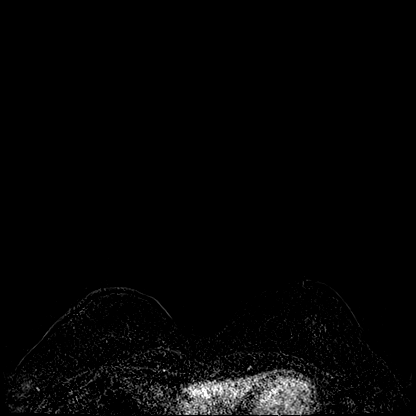
[im 56/112]
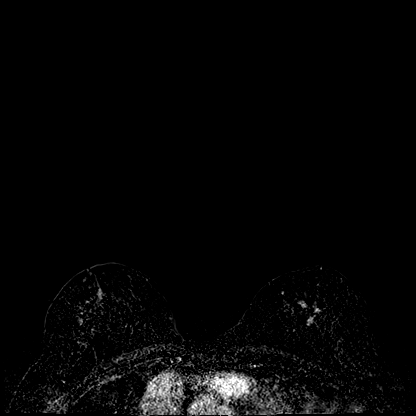
[im 84/112]
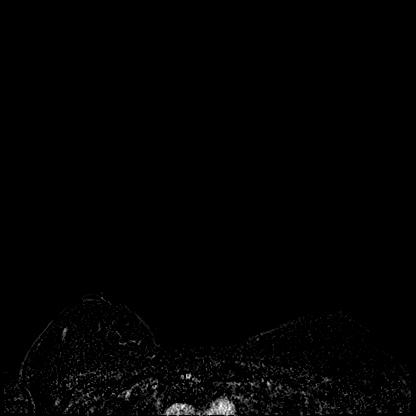
[im 112/112]
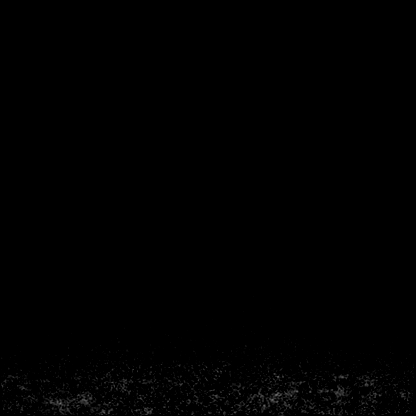

[Series 11: T1 fat-sat · axial · 1.6mm · 0.70mm/px · 1 of 112 slices shown (4 of 4)]
[im 1/112]
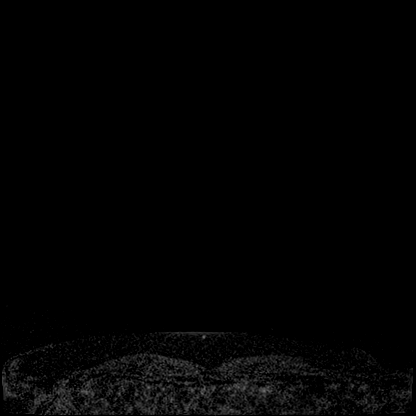

[28 of 48 positions shown; findings below may reference images not displayed]

Three-dimensional MR images were rendered by post-processing of the
original MR data on an independent workstation. The
three-dimensional MR images were interpreted, and findings are
reported in the following complete MRI report for this study. Three
dimensional images were evaluated at the independent interpreting
workstation using the DynaCAD thin client.
FINDINGS: Breast composition: d. Extreme fibroglandular tissue.

Background parenchymal enhancement: Mild.

Right breast: Irregular enhancing mass containing a biopsy marker
clip artifact in the upper outer quadrant of the right breast in the
middle 3rd. This measures 1.7 x 1.6 cm on image number 48 series 8
and 2.3 cm in cephalocaudal dimension. This has a mixture of plateau
and persistent enhancement kinetics and contains a biopsy marker
clip artifact, corresponding to the recently biopsied malignancy.

There is also a small area of patchy, non mass enhancement in the
medial right breast at the level of the nipple, in the middle depth.
This has a mixture of plateau and persistent enhancement kinetics.
This measures 1.2 x 0.8 cm on image number 60 series 12 and 0.5 cm
in cephalocaudal dimension.

Also demonstrated is a large number of cysts in the breast. The
largest cysts are in inferior portions of the breast.

Left breast: There is clumped nodular enhancement in the central
left breast, measuring 2.7 x 2.3 cm on image number 63 series 12 and
4.2 cm in cephalocaudal dimension. This has predominantly persistent
enhancement kinetics.

Also demonstrated are multiple left breast cysts, on average,
smaller than the cysts on the right.

Lymph nodes: No abnormal appearing lymph nodes.

Ancillary findings:  None.
IMPRESSION: 1. 2.3 x 1.7 x 1.6 cm biopsy-proven invasive ductal carcinoma in the
10 o'clock position of the right breast.
2. 1.2 x 0.8 x 0.5 cm area of patchy, non mass enhancement in the
medial right breast in the 3 o'clock position. This has
indeterminate MR features and could represent fibrocystic changes or
DCIS.
3. 4.2 x 2.7 x 2.3 cm area of clumped nodular enhancement in the
central left breast. This also has indeterminate MR features and
could represent fibrocystic changes or DCIS.
4. Multiple bilateral breast cysts.
5. No adenopathy.

RECOMMENDATION:
1. MR guided core needle biopsy of the 1.2 cm area of patchy, non
mass enhancement in 3 o'clock position of the right breast.
2. MR guided core needle biopsy of the 4.2 cm area of clumped
nodular enhancement in the central left breast.

BI-RADS CATEGORY  4: Suspicious.

## 2019-12-03 MED ORDER — GADOBUTROL 1 MMOL/ML IV SOLN
6.5000 mL | Freq: Once | INTRAVENOUS | Status: AC | PRN
  Administered 2019-12-03: 6 mL via INTRAVENOUS

## 2019-12-03 NOTE — Progress Notes (Addendum)
Anesthesia Chart Review:  Case: [id number] Date/Time: 12/09/19 0815   Procedure: RIGHT BREAST LUMPECTOMY X 2 WITH RADIOACTIVE SEED AND SENTINEL LYMPH NODE MAPPING (Right Breast) - PEC BLOCK   Anesthesia type: General   Pre-op diagnosis: RIGHT BREAST CANCER   Location: [place] OR ROOM 06 / [facility] OR   Surgeons: Erroll Luna, MD      DISCUSSION: Patient is an 79 year old female scheduled for the above procedure.  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 Roseanne Reno Dr. Annamaria Boots), anemia, vertigo, hypothyroidism, glaucoma, appendectomy (12/09/07), left BBB.  Patient's respiratory status was evaluated by Karoline Caldwell, PA-C at 12/02/19 PAT visit. He wrote, "Addendum 12/03/19 by Karoline Caldwell, PA-C: Pt was evaluated at PAT due to pulmonary hx. She reported breathing as baseline. She does get DOE but this is stable. Lungs CTAB.  When she was seen by Dr. Annamaria Boots on 10/8 she had a routine chest x-ray for monitoring of chronic pulmonary disease.  CXR showed some hazy areas.  Per note, infection could not be ruled out.  Z-Pak was sent. Spoke to patient, she was unaware prescription has been sent.  She has remained asymptomatic from a pulmonary standpoint.  She advised to pick up medication and take as prescribed.  She said she would do so as soon as she left appointment."  Last pulmonology evaluation 11/27/19 by Dr. Annamaria Boots.  Bronchiectasis symptoms stable. Sample of Trelegy 100 given. He did start her on a Z-Pak as a precautionary measure for slight increase opacity on CXR. He is aware that she is pending right breast lumpectomy plan.   She has a chronic left BBB since at least 03/16/05 (old EKGs in [place] printed and placed on hard chart). I called and spoke with patient. She does not recall ever  being told that she has a left BBB or ever seeing a cardiologist. She denied know cardiac history personally, but said both her parents had heart attacks in their 44's but were both smokers. Her younger sister has afib s/p ablation. Her middle sister died at age 29 and was morbidly obese and inactive--it was unclear whether she may have died from a AAA or MI (family believes AAA due to some symptoms she experienced leading up to her death). Patient denied prior cardiac testing, although due to concern of family history of AAA, she had an aortic US last year which was negative for AAA. No SOB at rest or with walking on flat surfaces. She is able to babysit her grandson after school and tries to walk ~ 3000 steps per day (walks laps in her son's house). She has chronic DOE, primarily with any type of incline, which she says has been stable for over a year. She denied chest pain, palpitations, syncope. She has had issues with vertigo in the past, but none in > 1 year. She has chronic intermittent mild left ankle swelling that is stable. No orthopnea. She said her HR stays ~ 70, but twice it went up to ~ 103 bpm but with no associated symptoms (last was > 1 month ago). Her O2 sats at home are typically in the low to mid 90's. She uses her inhalers (does not have a nebulizer), but does not require home chest percussion. She is not currently having issues with persistent/chronic cough. No fever. Continues to feel at her  baseline from a respiratory status. She confirmed she is taking the Zpak as prescribed. She says her son will be with her after surgery.   Given her chronic LBBB without prior cardiac testing, I reviewed case with anesthesiologist Suella Broad, MD. He recommended speaking with a cardiologist to inquire if there were  any general guidelines pertaining to perioperative evaluation of patients with chronic left BBB without previous cardiac testing. I was able to speak with Henry Russel, MD. He  discussed that in a patient with known chronic LBBB who is scheduled for a time sensitive (as in this case due to cancer diagnosis), low-moderate risk surgery, who is not having CV/CHF symptoms, and has activity tolerance 4 METS (as in this case) then in general, it would be considered reasonable to proceed without further cardiac testing. This was a general recommendations, as he cannot provide specific "clearance" on a patient that he has not personally examined. Also, generally, he would defer decision for future cardiology evaluation based on new/progression of any symptoms, new EKG changes, or for patient and/or provider preference.   RSL is scheduled for 12/08/19. Anesthesia team to evaluate on the day of surgery.   VS: BP (!) 152/62   Pulse 84   Temp (!) 36.4 C (Oral)   Resp 17   Ht _0  (1.702 m)   Wt 63.2 kg   SpO2 96%   BMI 21.82 kg/m    PROVIDERS: Jonathon Jordan, MD is PCP Baird Lyons, MD is pulmonologist Nicholas Lose, MD is HEM-ONC Kyung Rudd, MD is RAD-ONC   LABS: Labs reviewed: Acceptable for surgery. (all labs ordered are listed, but only abnormal results are displayed)  Labs Reviewed  CBC WITH DIFFERENTIAL/PLATELET  COMPREHENSIVE METABOLIC PANEL    Spirometry 06/29/14: "moderate obstructive airways disease, FVC 2.17/69%, FEV1 1.39/60%, FEV1/FVC 64%, FEF 25-75 percent 0.75/4"   IMAGES: CXR 11/27/19: FINDINGS: Biapical scarring with bronchiectasis. Slight increased opacity at the right hilar region with patchy opacity at the left base. Normal heart size. No pneumothorax. IMPRESSION: Biapical scarring with bilateral bronchiectasis. Possible acute superimposed airspace opacities in the right hilar region and left base, suggest short interval radiographic follow-up. - Per Dr. Annamaria Boots, "CXR- shows some hazy areas. Infection can't be ruled out. I will send script for Zpak to her drug store and we will repeat the CXR in a month"  CT Chest  07/11/18: IMPRESSION: 1. 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 2. Dominant right lower lobe nodule is stable. If additional follow-up is desired, CT chest without contrast in 1 year could be performed. 3. Aortic atherosclerosis (ICD10-170.0). Coronary artery calcification.  US abdominal aorta 07/02/18 (report in [facility]/PACS): IMPRESSION: Aortic atherosclerosis. No evidence of aneurysm.   EKG: EKG 12/02/19: Normal sinus rhythm Left bundle branch block Abnormal ECG No significant change since last tracing Confirmed by Candee Furbish [phone]) on 12/02/2019 9:10:33 PM  Comparison EKGs in [facility]: EKG 03/16/05:  Normal sinus rhythm Left bundle branch block  EKG 03/09/06: Normal sinus rhythm Nonspecific intraventricular conduction block Anterolateral infarct, age undetermined  EKG 12/09/07: Normal sinus rhythm Left bundle branch block Prolonged QT   CV: Denied prior stress test, echocardiogram, cardiac cath.   Past Medical History:  Diagnosis Date  . Allergy   . Anemia   . Breast cancer ([place])   . Cataract   . COPD (chronic obstructive pulmonary disease) ([place])   . Depression   . Essential hypertension 11/29/2014  . Glaucoma   .  Hemoptysis   . Hypothyroidism   . Oxygen deficiency    patient was using oxygen at home, her pulmonologist discontinued it and patient stopped using it on Monday Mar 20, 2015  . Restless leg syndrome   . Sinusitis   . Vertigo     Past Surgical History:  Procedure Laterality Date  . ABDOMINAL HYSTERECTOMY    . APPENDECTOMY  2009  . BREAST CYST ASPIRATION Right 06/12/2016  . VESICOVAGINAL FISTULA CLOSURE W/ TAH  1992    MEDICATIONS: . albuterol (VENTOLIN HFA) 108 (90 Base) MCG/ACT inhaler  . azelastine (ASTELIN) 0.1 % nasal spray  . Azelastine-Fluticasone (DYMISTA) 137-50 MCG/ACT SUSP  . azithromycin  (ZITHROMAX Z-PAK) 250 MG tablet  . AZOPT 1 % ophthalmic suspension  . benzonatate (TESSALON) 100 MG capsule  . BETA CAROTENE PO  . cholecalciferol (VITAMIN D3) 25 MCG (1000 UT) tablet  . citalopram (CELEXA) 20 MG tablet  . Coenzyme Q10 (COQ-10) 100 MG CAPS  . Cyanocobalamin (B-12 PO)  . dorzolamide (TRUSOPT) 2 % ophthalmic solution  . ferrous gluconate (FERGON) 246 (28 FE) MG tablet  . fluticasone (FLONASE) 50 MCG/ACT nasal spray  . Fluticasone-Umeclidin-Vilant (TRELEGY ELLIPTA) 100-62.5-25 MCG/INH AEPB  . FLUZONE HIGH-DOSE QUADRIVALENT 0.7 ML SUSY  . folic acid (FOLATE) 893 MCG tablet  . Krill Oil 350 MG CAPS  . latanoprost (XALATAN) 0.005 % ophthalmic solution  . levothyroxine (SYNTHROID, LEVOTHROID) 88 MCG tablet  . Multiple Minerals (CALCIUM/MAGNESIUM/ZINC) TABS  . Multiple Vitamin (MULTIVITAMIN) tablet  . Naproxen Sodium (ALEVE) 220 MG CAPS  . NONFORMULARY OR COMPOUNDED ITEM  . rosuvastatin (CRESTOR) 5 MG tablet  . vitamin C (ASCORBIC ACID) 500 MG tablet  . vitamin E 400 UNIT capsule   . 0.9 %  sodium chloride infusion     Myra Gianotti, PA-C Surgical Short Stay/Anesthesiology [facility] Phone [phone] [facility] Phone [phone] 12/04/2019 3:30 PM

## 2019-12-04 ENCOUNTER — Encounter (HOSPITAL_COMMUNITY): Payer: Self-pay

## 2019-12-04 NOTE — Anesthesia Preprocedure Evaluation (Deleted)
Anesthesia Evaluation    Airway        Dental   Pulmonary           Cardiovascular hypertension,      Neuro/Psych    GI/Hepatic   Endo/Other    Renal/GU      Musculoskeletal   Abdominal   Peds  Hematology   Anesthesia Other Findings   Reproductive/Obstetrics                             Anesthesia Physical Anesthesia Plan  ASA:   Anesthesia Plan:    Post-op Pain Management:    Induction:   PONV Risk Score and Plan:   Airway Management Planned:   Additional Equipment:   Intra-op Plan:   Post-operative Plan:   Informed Consent:   Plan Discussed with:   Anesthesia Plan Comments: (See PAT note written 12/04/2019 by Myra Gianotti, PA-C. Hx breast cancer, bronchiectasis (Dr. Annamaria Boots) and known chronic BBB.  )        Anesthesia Quick Evaluation

## 2019-12-05 ENCOUNTER — Inpatient Hospital Stay (HOSPITAL_COMMUNITY): Admission: RE | Admit: 2019-12-05 | Payer: Medicare HMO | Source: Ambulatory Visit

## 2019-12-07 ENCOUNTER — Telehealth: Payer: Self-pay | Admitting: *Deleted

## 2019-12-07 ENCOUNTER — Other Ambulatory Visit: Payer: Self-pay | Admitting: Surgery

## 2019-12-07 DIAGNOSIS — R9389 Abnormal findings on diagnostic imaging of other specified body structures: Secondary | ICD-10-CM

## 2019-12-08 ENCOUNTER — Telehealth: Payer: Self-pay | Admitting: *Deleted

## 2019-12-08 ENCOUNTER — Inpatient Hospital Stay: Admission: RE | Admit: 2019-12-08 | Payer: Medicare HMO | Source: Ambulatory Visit

## 2019-12-08 ENCOUNTER — Encounter: Payer: Self-pay | Admitting: *Deleted

## 2019-12-08 NOTE — Telephone Encounter (Signed)
Spoke to pt concerning MRI results and next step after bilateral MRI bx. Received verbal understanding. Denies further needs or questions at this time.

## 2019-12-09 ENCOUNTER — Ambulatory Visit (HOSPITAL_COMMUNITY): Admission: AD | Admit: 2019-12-09 | Payer: Medicare HMO | Source: Home / Self Care | Admitting: Surgery

## 2019-12-09 ENCOUNTER — Encounter (HOSPITAL_COMMUNITY): Admission: AD | Payer: Self-pay | Source: Home / Self Care

## 2019-12-09 ENCOUNTER — Ambulatory Visit (HOSPITAL_COMMUNITY): Admission: RE | Admit: 2019-12-09 | Payer: Medicare HMO | Source: Ambulatory Visit

## 2019-12-09 ENCOUNTER — Other Ambulatory Visit (HOSPITAL_COMMUNITY): Payer: Medicare HMO

## 2019-12-09 SURGERY — BREAST LUMPECTOMY WITH RADIOACTIVE SEED AND SENTINEL LYMPH NODE BIOPSY
Anesthesia: General | Site: Breast | Laterality: Right

## 2019-12-15 DIAGNOSIS — H401131 Primary open-angle glaucoma, bilateral, mild stage: Secondary | ICD-10-CM | POA: Diagnosis not present

## 2019-12-15 DIAGNOSIS — Z961 Presence of intraocular lens: Secondary | ICD-10-CM | POA: Diagnosis not present

## 2019-12-16 ENCOUNTER — Other Ambulatory Visit (HOSPITAL_COMMUNITY): Payer: Self-pay | Admitting: Diagnostic Radiology

## 2019-12-16 ENCOUNTER — Ambulatory Visit
Admission: RE | Admit: 2019-12-16 | Discharge: 2019-12-16 | Disposition: A | Discharge status: Home / Self Care | Payer: Medicare HMO | Source: Ambulatory Visit | Attending: Surgery | Admitting: Surgery

## 2019-12-16 ENCOUNTER — Other Ambulatory Visit: Payer: Self-pay

## 2019-12-16 DIAGNOSIS — R9389 Abnormal findings on diagnostic imaging of other specified body structures: Secondary | ICD-10-CM

## 2019-12-16 DIAGNOSIS — R928 Other abnormal and inconclusive findings on diagnostic imaging of breast: Secondary | ICD-10-CM | POA: Diagnosis not present

## 2019-12-16 DIAGNOSIS — D0512 Intraductal carcinoma in situ of left breast: Secondary | ICD-10-CM | POA: Diagnosis not present

## 2019-12-16 IMAGING — MR MR BREAST BX W LOC DEV 1ST LESION IMAGE BX SPEC MR GUIDE*L*
7 of 10 series · 33 of 48 positions shown · IV contrast (gadavist)
Comparison: Previous exams.
COMPARISON: Previous exams.

Addendum:
CLINICAL DATA: Patient presents for MRI guided core needle biopsy
of focal 1.2 cm non mass enhancement medial right breast as well as
4.2 cm clumped non mass enhancement central left breast. Recent
diagnosis of right breast cancer. Only the left breast could be
biopsied today due to lack of supplies as additional biopsy needle
apparatus is not available.

EXAM:
MRI GUIDED CORE NEEDLE BIOPSY OF THE LEFT BREAST
TECHNIQUE: Multiplanar, multisequence MR imaging of the left breast was
performed both before and after administration of intravenous
contrast.
CONTRAST:  5mL GADAVIST GADOBUTROL 1 MMOL/ML IV SOLN

[Series 2: fiducial bilateral · sagittal · 2.0mm · 1.33mm/px · 5 of 176 slices shown]
[im 1/176]
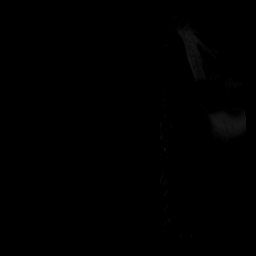
[im 44/176]
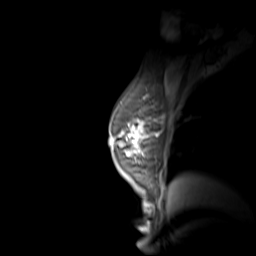
[im 88/176]
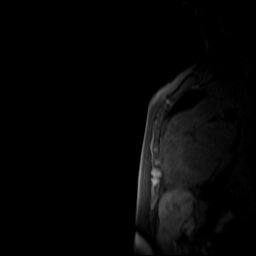
[im 132/176]
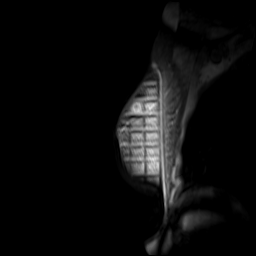
[im 176/176]
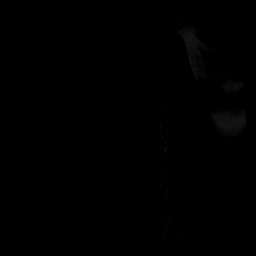

[Series 3: dynamic pre · axial · non-contrast · 1.3mm · 0.73mm/px · z∈[-114,+72]mm · 4 of 144 slices shown]
[im 1/144]
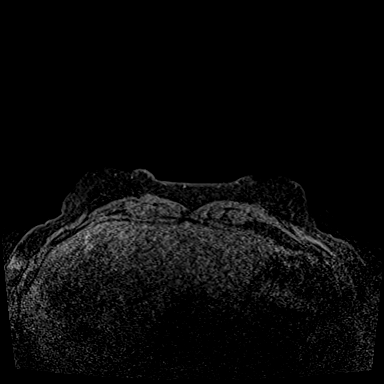
[im 48/144]
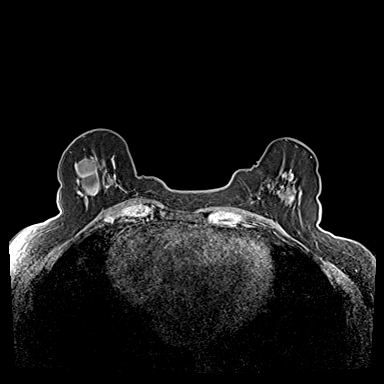
[im 96/144]
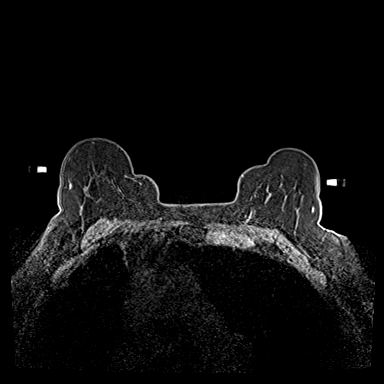
[im 144/144]
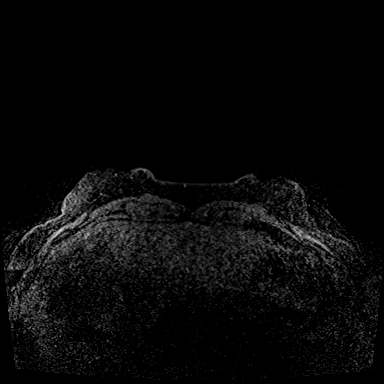

[Series 4: dynamic post 20 · axial · 1.3mm · 0.73mm/px · z∈[-114,+72]mm · 4 of 144 slices shown (1 of 2)]
[im 1/144]
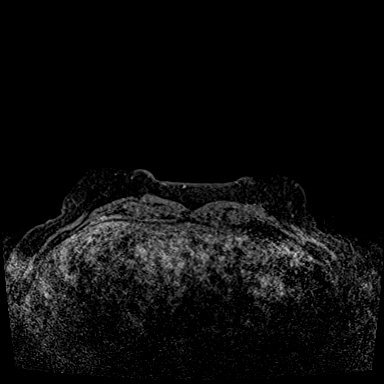
[im 48/144]
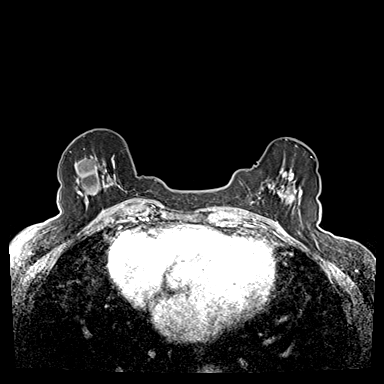
[im 96/144]
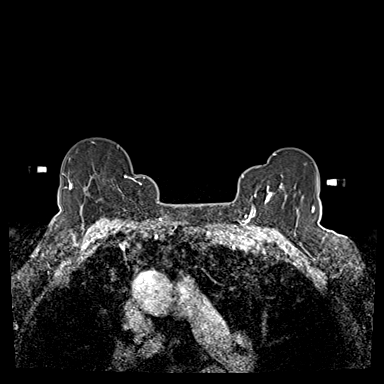
[im 144/144]
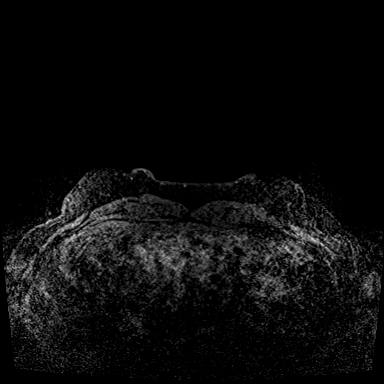

[Series 5: dynamic post 20 · axial · 1.3mm · 0.73mm/px · z∈[-114,+72]mm · 5 of 144 slices shown (2 of 2)]
[im 1/144]
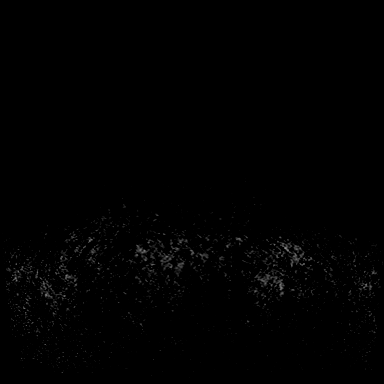
[im 36/144]
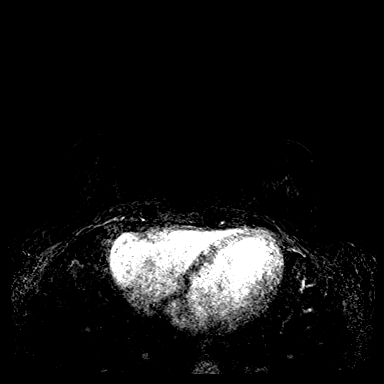
[im 72/144]
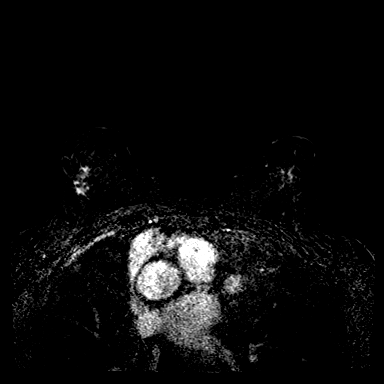
[im 108/144]
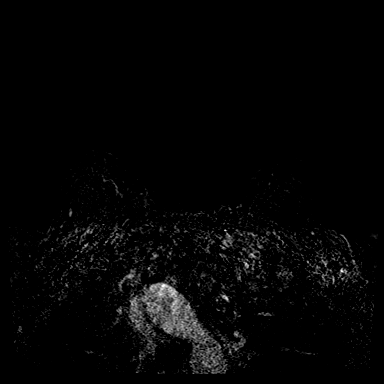
[im 144/144]
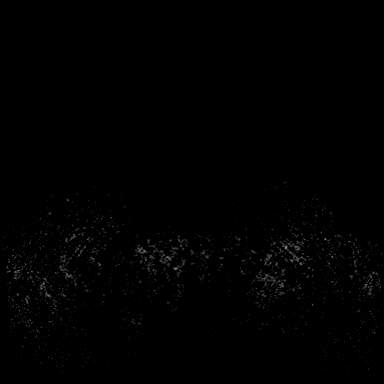

[Series 6: dynamic post 3 · axial · 1.3mm · 0.73mm/px · z∈[-114,+72]mm · 5 of 144 slices shown (1 of 2)]
[im 1/144]
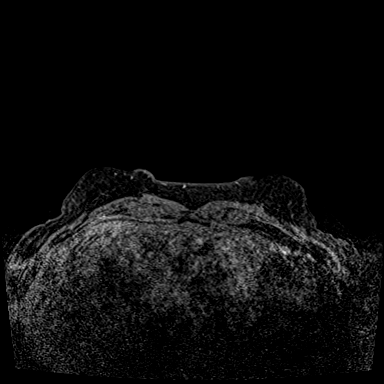
[im 36/144]
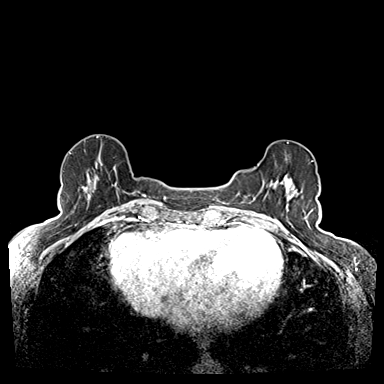
[im 72/144]
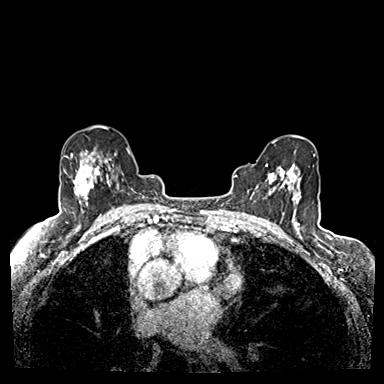
[im 108/144]
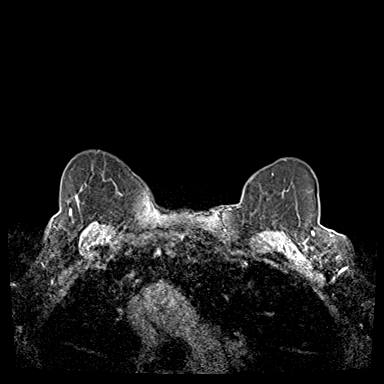
[im 144/144]
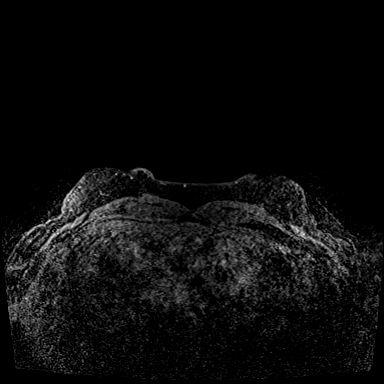

[Series 7: dynamic post 3 · axial · 1.3mm · 0.73mm/px · z∈[-114,+72]mm · 5 of 144 slices shown (2 of 2)]
[im 1/144]
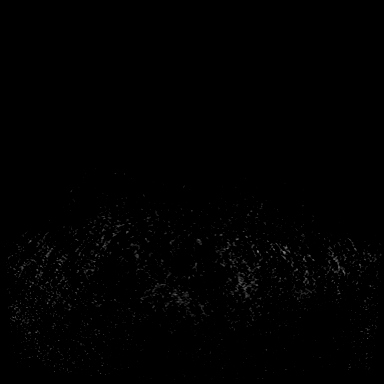
[im 36/144]
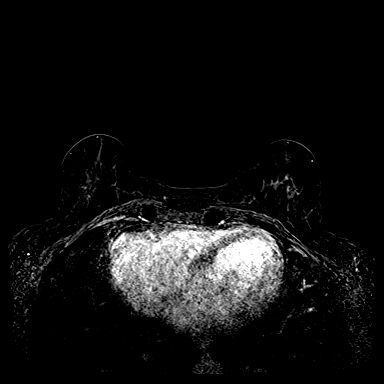
[im 72/144]
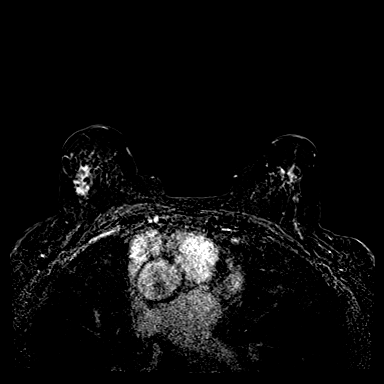
[im 108/144]
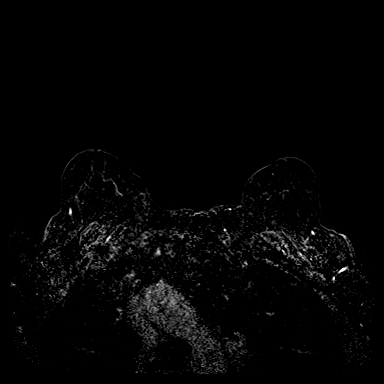
[im 144/144]
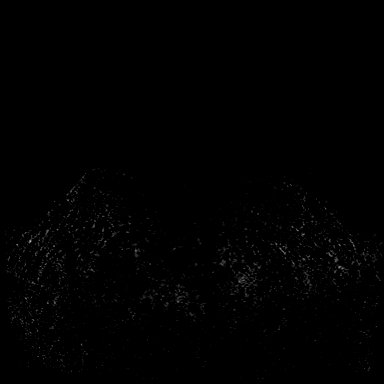

[Series 8: needle confirmation · axial · 1.3mm · 0.73mm/px · z∈[-114,+72]mm · 5 of 144 slices shown]
[im 1/144]
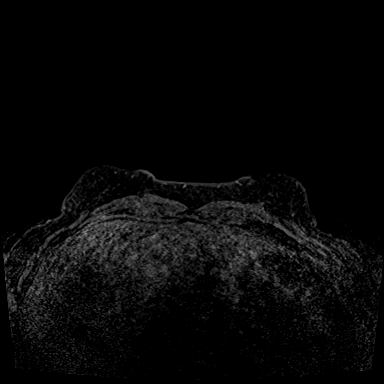
[im 36/144]
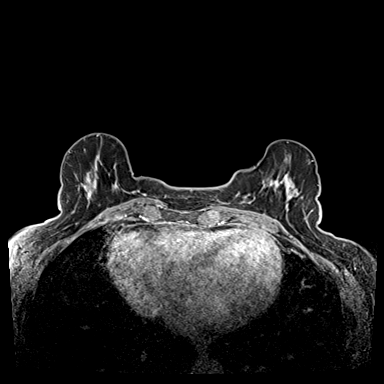
[im 72/144]
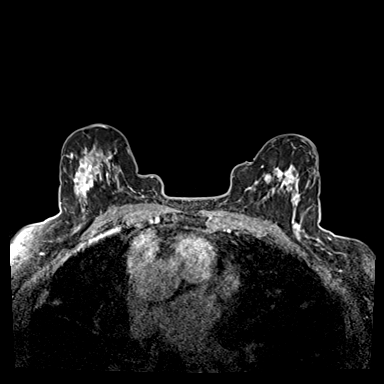
[im 108/144]
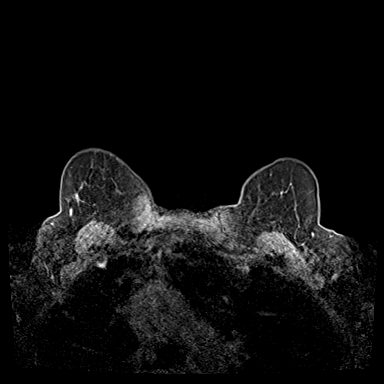
[im 144/144]
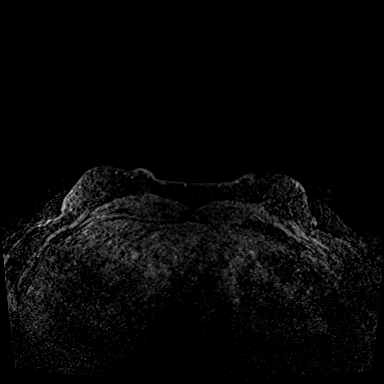

[33 of 48 positions shown; findings below may reference images not displayed]

FINDINGS: I met with the patient, and we discussed the procedure of MRI guided
biopsy, including risks, benefits, and alternatives. Specifically,
we discussed the risks of infection, bleeding, tissue injury, clip
migration, and inadequate sampling. Informed, written consent was
given. The usual time out protocol was performed immediately prior
to the procedure.

Using sterile technique, 1% Lidocaine, MRI guidance, and a 9 gauge
vacuum assisted device, biopsy was performed of targeted 4.2 cm area
of non mass enhancement in the middle third of the central left
breast using a lateral to medial approach. At the conclusion of the
procedure, a barbell shaped tissue marker clip was deployed into the
biopsy cavity. Follow-up 2-view mammogram was performed and dictated
separately.
IMPRESSION: MRI guided biopsy of 4.2 cm area of non mass enhancement over the
central left breast. No apparent complications. Note that the
patient still requires biopsy of a 1.2 cm focus of non mass
enhancement over the medial right breast which could not be
performed today due to lack of biopsy needle supplies.

ADDENDUM:
Pathology revealed HIGH GRADE DUCTAL CARCINOMA IN SITU WITH NECROSIS
of the LEFT breast, central. This was found to be concordant by Dr.
DYKA.

Pathology results were discussed with the patient by telephone. The
patient reported doing well after the biopsy with tenderness at the
site. Post biopsy instructions and care were reviewed and questions
were answered. The patient was encouraged to call The [REDACTED] for any additional concerns. My direct phone
number was provided.

The patient has a recent diagnosis of RIGHT breast cancer and should
follow her outlined treatment plan.

The patient is scheduled for a RIGHT breast MRI guided biopsy on
[DATE]. Further recommendations will be guided by the
results of this biopsy.

Dr. DYKA was notified of biopsy results via [REDACTED] message
on [DATE].

Pathology results reported by DYKA, RN on [DATE].

*** End of Addendum ***
FINDINGS: I met with the patient, and we discussed the procedure of MRI guided
biopsy, including risks, benefits, and alternatives. Specifically,
we discussed the risks of infection, bleeding, tissue injury, clip
migration, and inadequate sampling. Informed, written consent was
given. The usual time out protocol was performed immediately prior
to the procedure.

Using sterile technique, 1% Lidocaine, MRI guidance, and a 9 gauge
vacuum assisted device, biopsy was performed of targeted 4.2 cm area
of non mass enhancement in the middle third of the central left
breast using a lateral to medial approach. At the conclusion of the
procedure, a barbell shaped tissue marker clip was deployed into the
biopsy cavity. Follow-up 2-view mammogram was performed and dictated
separately.
IMPRESSION: MRI guided biopsy of 4.2 cm area of non mass enhancement over the
central left breast. No apparent complications. Note that the
patient still requires biopsy of a 1.2 cm focus of non mass
enhancement over the medial right breast which could not be
performed today due to lack of biopsy needle supplies.

## 2019-12-16 IMAGING — MG MM BREAST LOCALIZATION CLIP
4 series · 4 of 12 positions shown · non-contrast
Comparison: Previous exam(s).

CLINICAL DATA: Patient is post MRI guided biopsy of 4.2 cm area of
non mass enhancement over the central left breast.

EXAM:
DIAGNOSTIC LEFT MAMMOGRAM POST MRI BIOPSY

[L ML synth-2D]
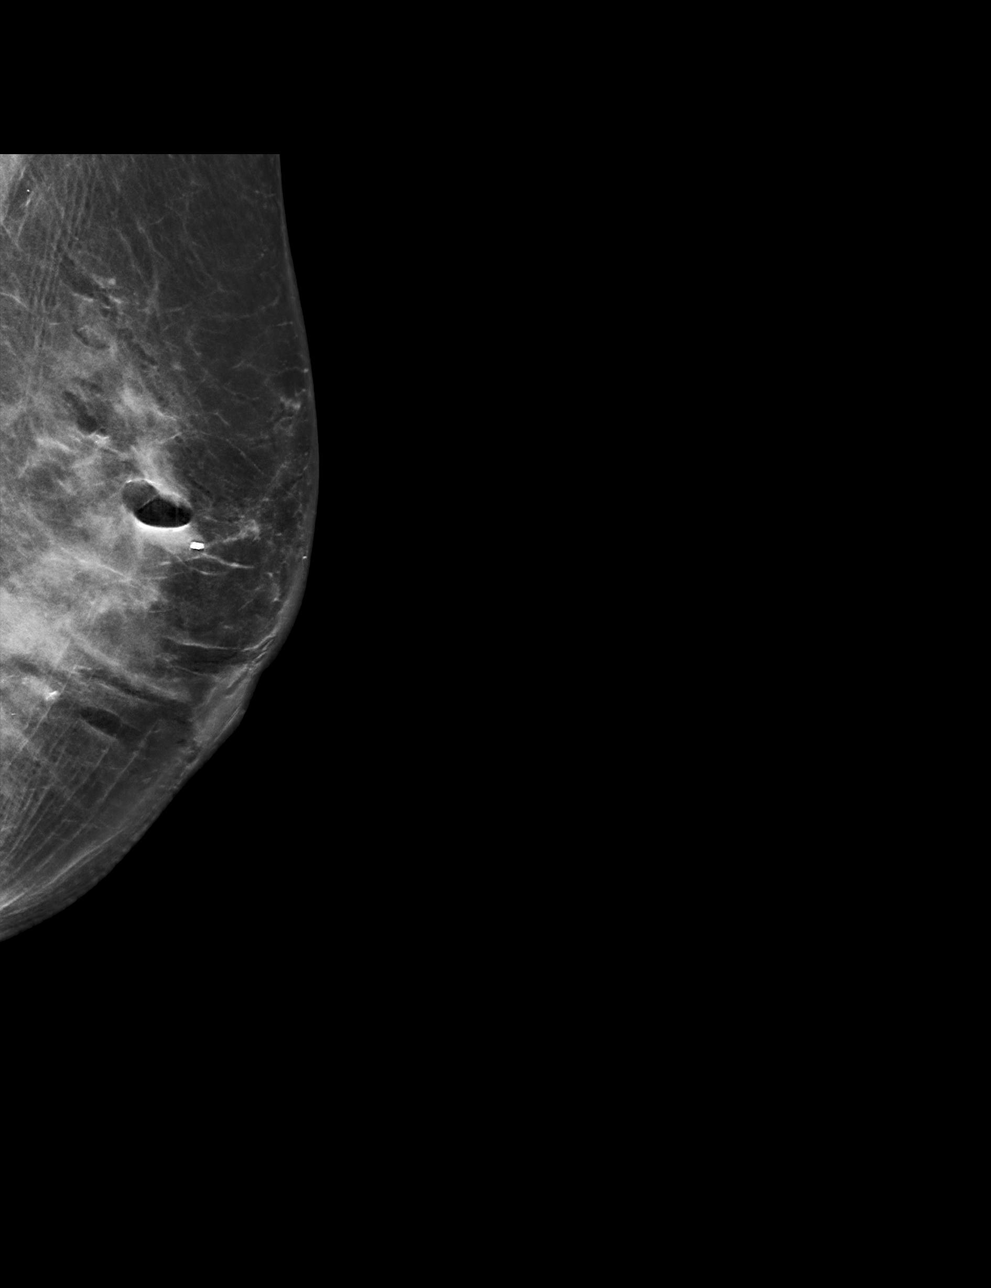

[L CC synth-2D]
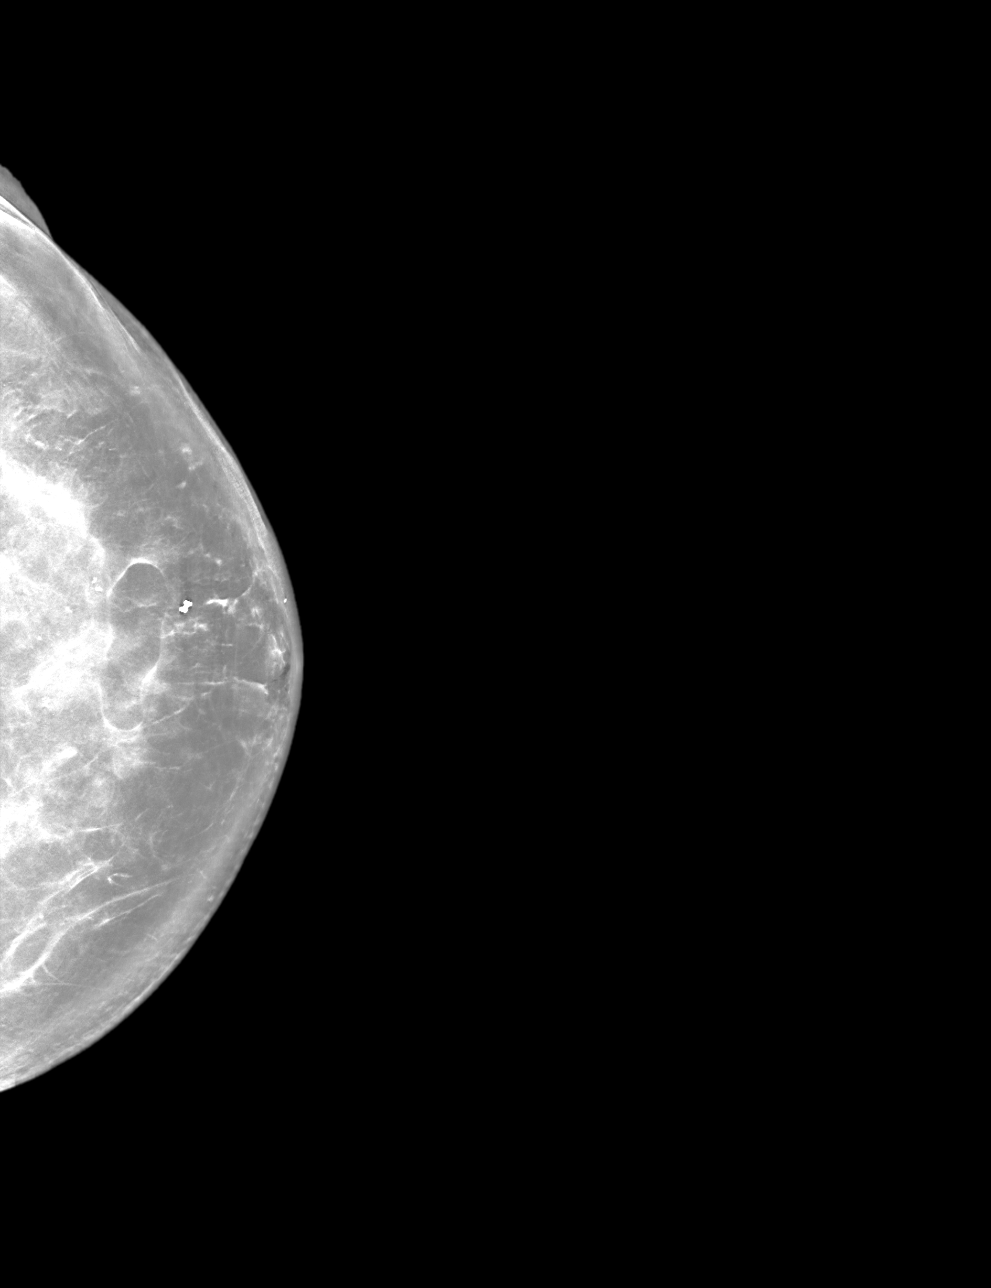

[L CC tomo · tomo slice 45/89.0]
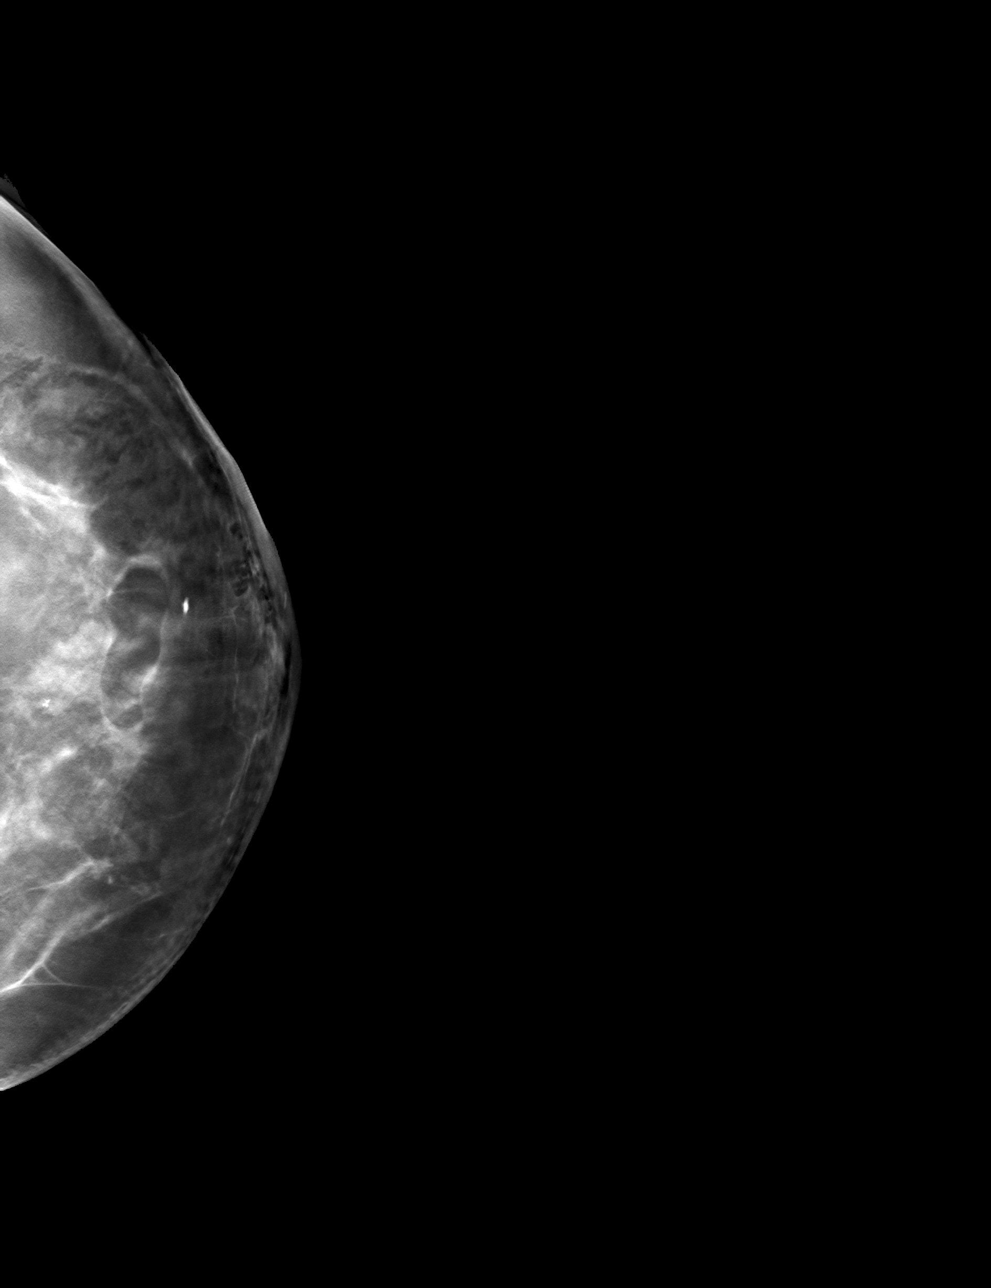

[L ML tomo · tomo slice 41/82.0]
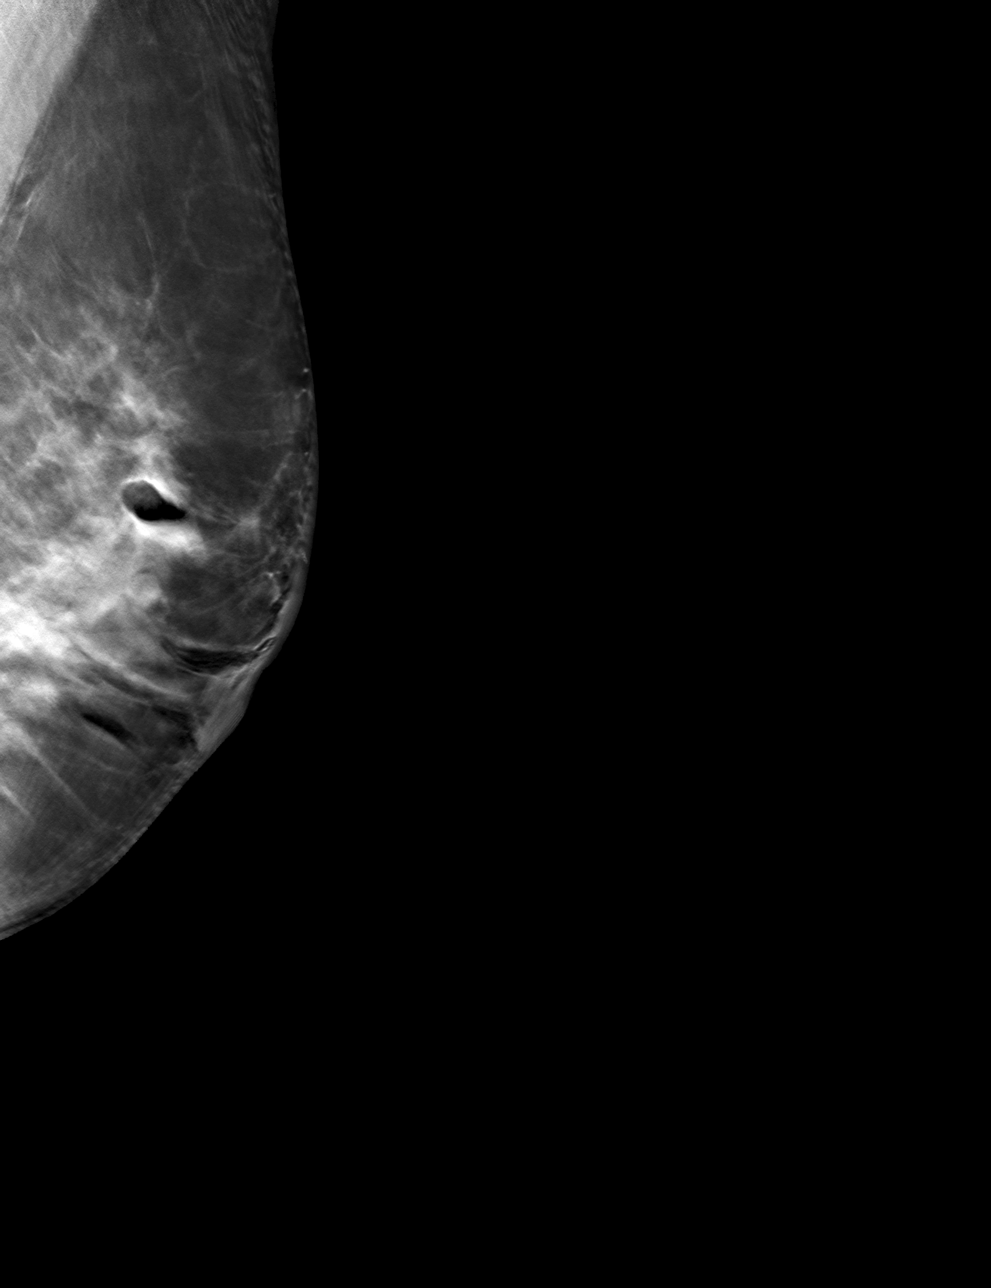

[4 of 12 positions shown; findings below may reference images not displayed]

FINDINGS: Mammographic images were obtained following MRI guided biopsy of the
targeted non mass enhancement over the central left breast. The
biopsy marking clip lies along the anterior edge of the expected
biopsy site.
IMPRESSION: Barbell shaped biopsy marking clip at the site of biopsy in the
central left breast as the clip lies along the anterior edge of the
biopsy site.

Final Assessment: Post Procedure Mammograms for Marker Placement

## 2019-12-16 MED ORDER — GADOBUTROL 1 MMOL/ML IV SOLN
5.0000 mL | Freq: Once | INTRAVENOUS | Status: AC | PRN
  Administered 2019-12-16: 5 mL via INTRAVENOUS

## 2019-12-17 ENCOUNTER — Encounter: Payer: Self-pay | Admitting: *Deleted

## 2019-12-18 ENCOUNTER — Encounter: Payer: Self-pay | Admitting: *Deleted

## 2019-12-21 ENCOUNTER — Ambulatory Visit: Payer: Medicare HMO | Admitting: Hematology and Oncology

## 2019-12-25 ENCOUNTER — Ambulatory Visit
Admission: RE | Admit: 2019-12-25 | Discharge: 2019-12-25 | Disposition: A | Discharge status: Home / Self Care | Payer: Medicare HMO | Source: Ambulatory Visit | Attending: Surgery | Admitting: Surgery

## 2019-12-25 ENCOUNTER — Other Ambulatory Visit: Payer: Self-pay

## 2019-12-25 DIAGNOSIS — D0511 Intraductal carcinoma in situ of right breast: Secondary | ICD-10-CM | POA: Diagnosis not present

## 2019-12-25 DIAGNOSIS — R928 Other abnormal and inconclusive findings on diagnostic imaging of breast: Secondary | ICD-10-CM | POA: Diagnosis not present

## 2019-12-25 IMAGING — MG MM BREAST LOCALIZATION CLIP
4 series · 4 of 12 positions shown · non-contrast
Comparison: Previous exam(s).
COMPARISON: Previous exam(s).

Addendum:
CLINICAL DATA: Evaluate CYLINDER biopsy clip placement following MR
guided RIGHT breast biopsy.

EXAM:
3D DIAGNOSTIC RIGHT MAMMOGRAM POST MRI BIOPSY

[R CC synth-2D]
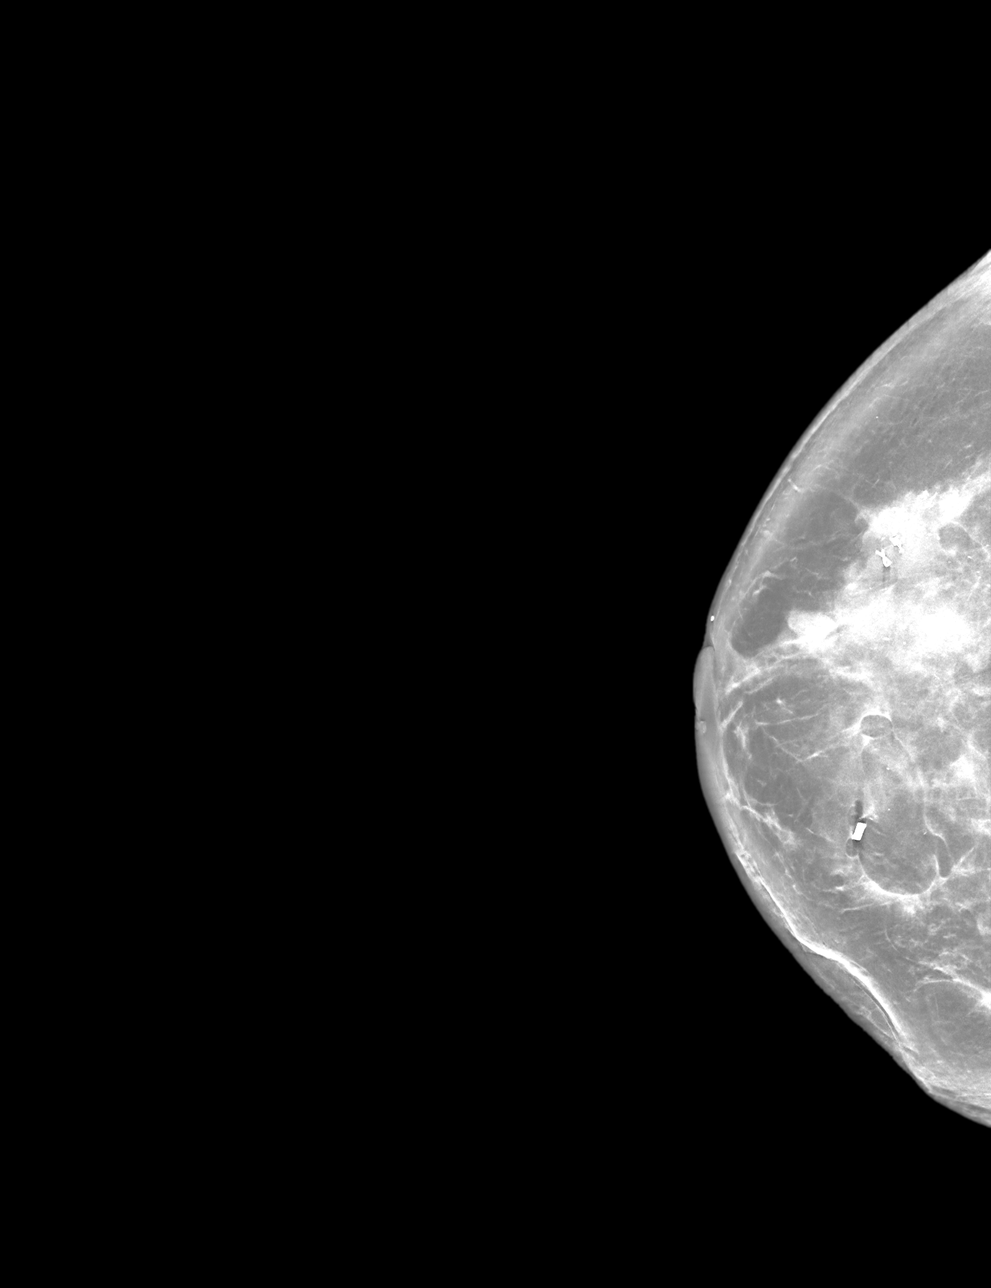

[R ML synth-2D]
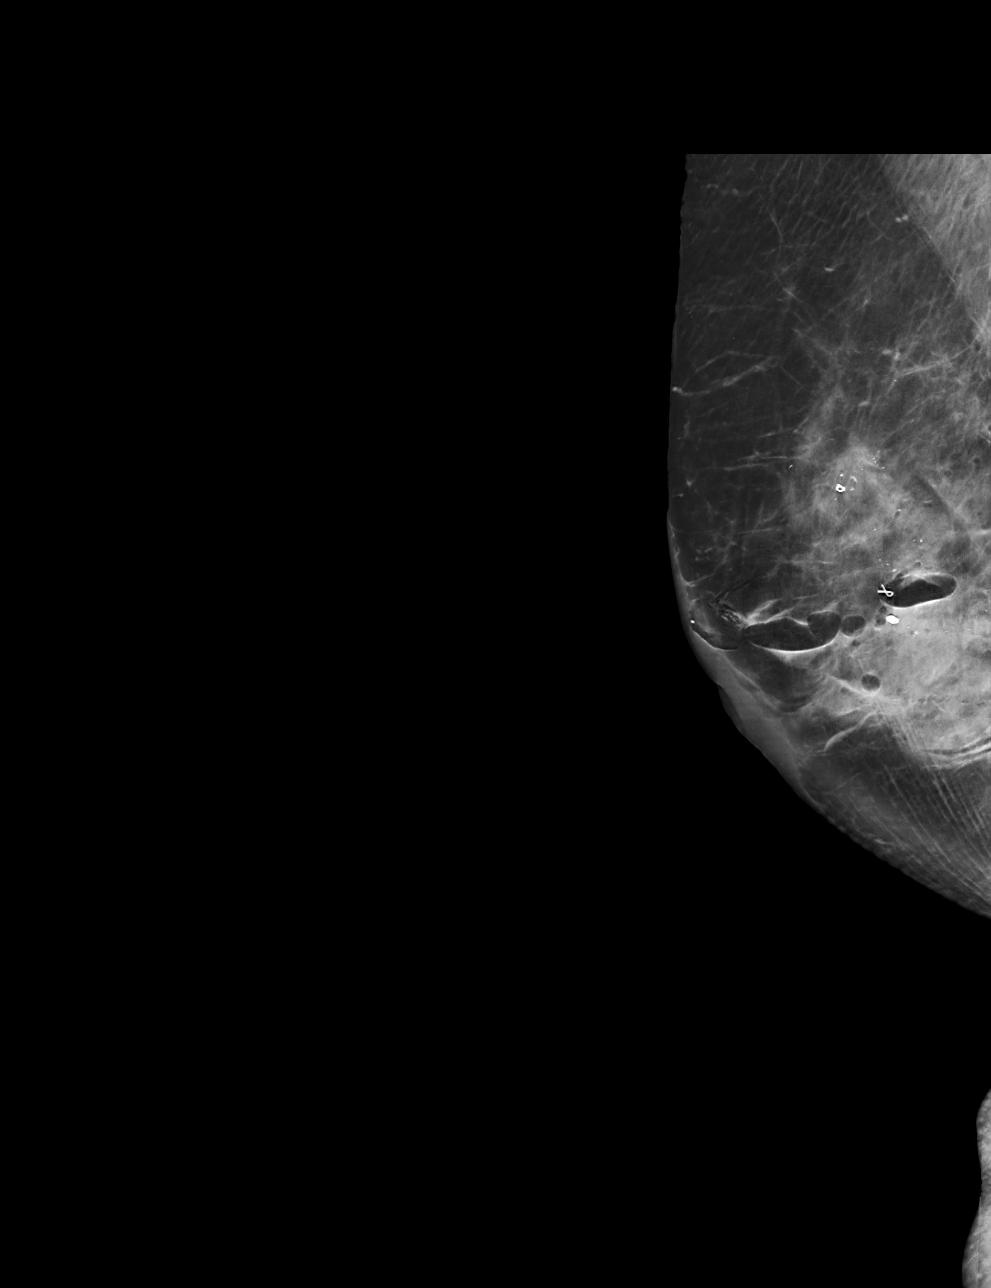

[R ML tomo · tomo slice 45/88.0]
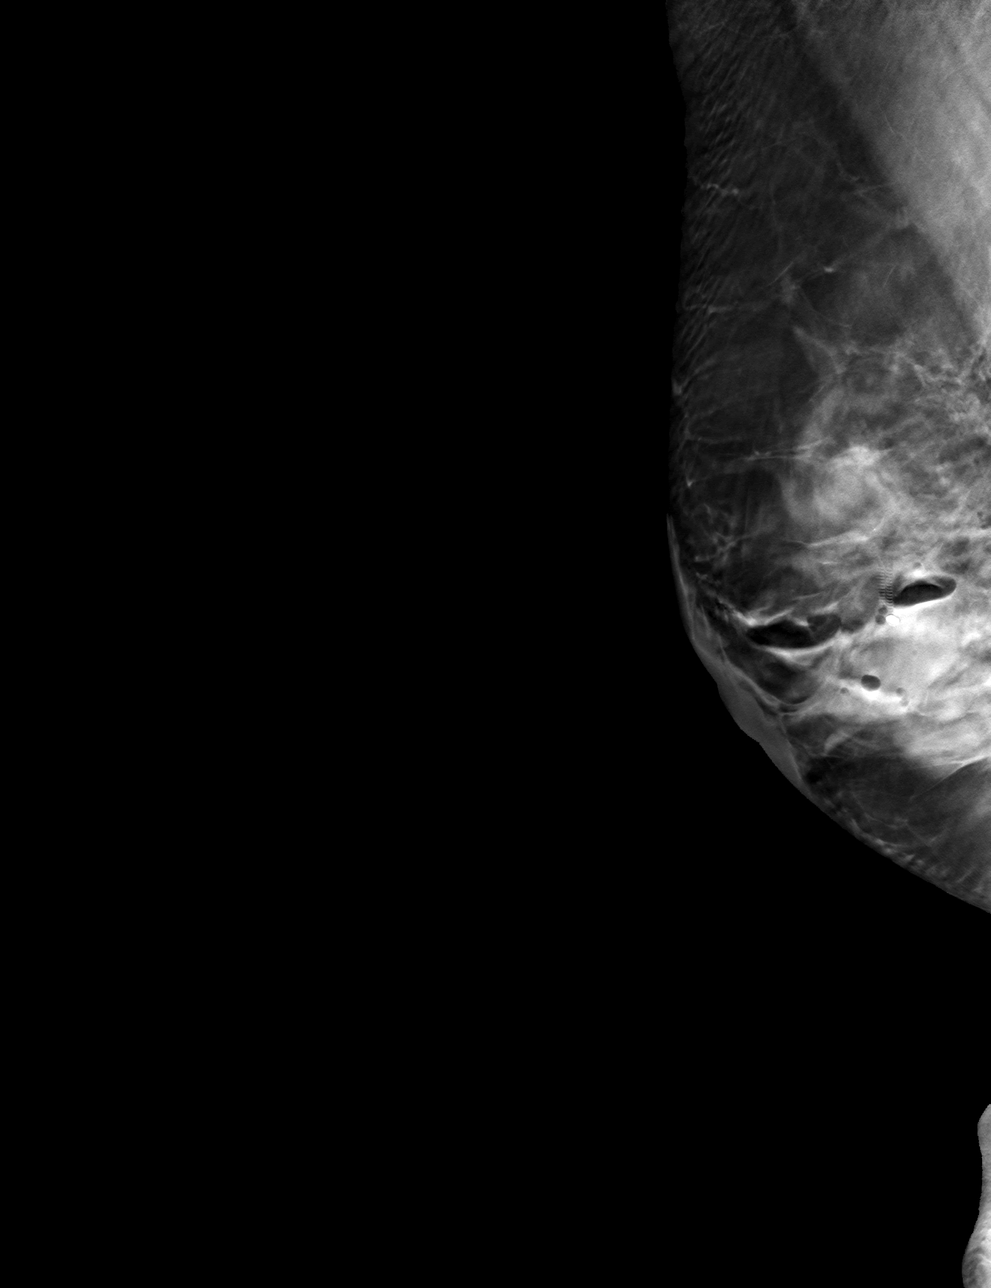

[R CC tomo · tomo slice 45/90.0]
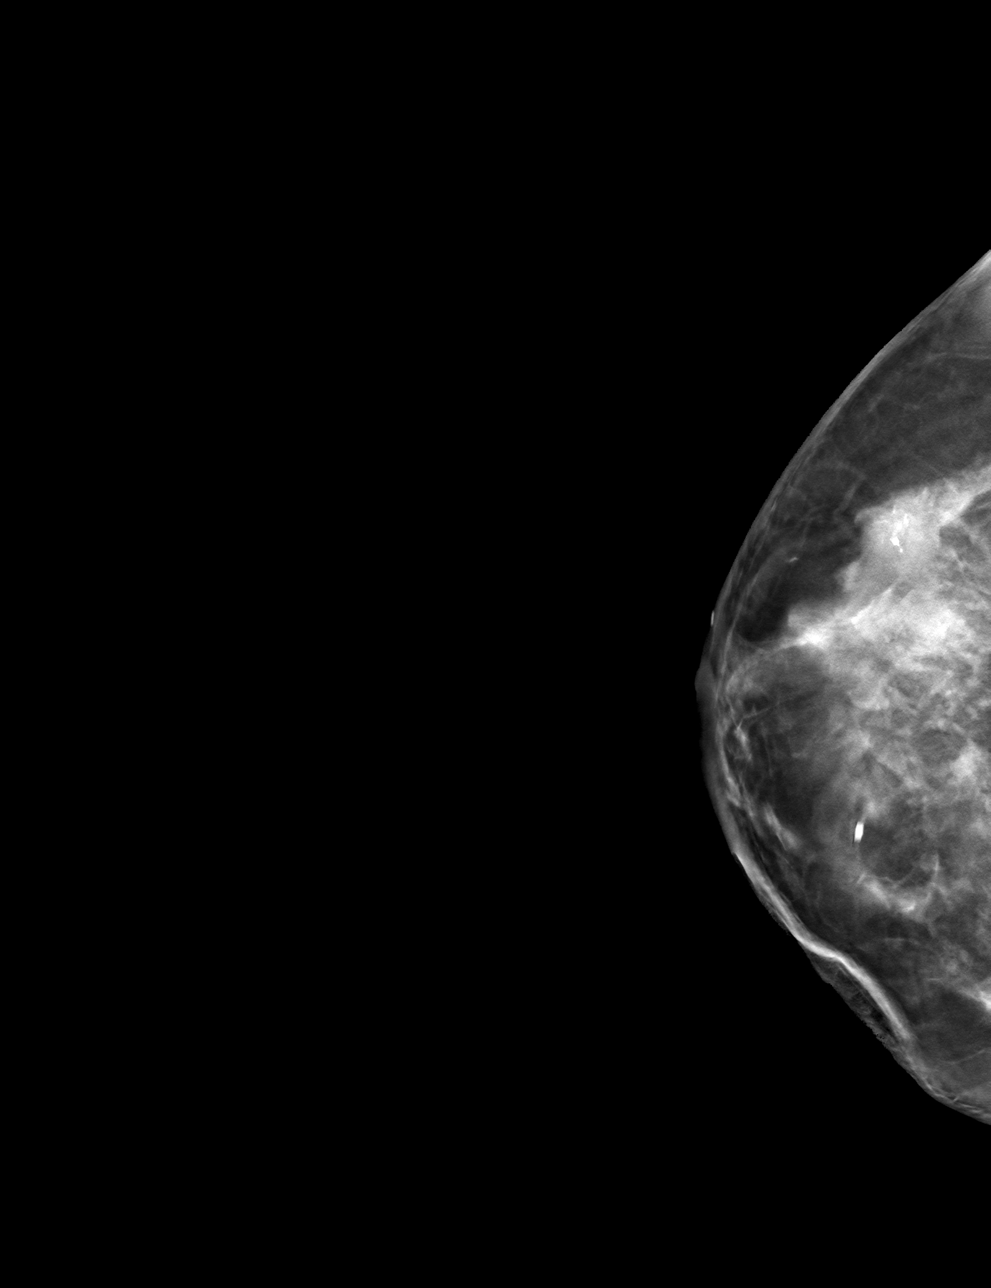

[4 of 12 positions shown; findings below may reference images not displayed]

FINDINGS: 3D Mammographic images were obtained following MR guided biopsy of
the 1.2 cm INNER RIGHT breast non masslike enhancement. The CYLINDER
biopsy marking clip is in expected position at the site of biopsy.

The CYLINDER clip is separated by a distance of 4.3 cm from the
RIBBON clip (at site of biopsy-proven malignancy).
IMPRESSION: Appropriate positioning of the CYLINDER shaped biopsy marking clip
at the site of biopsy in the INNER RIGHT breast.

Final Assessment: Post Procedure Mammograms for Marker Placement

ADDENDUM:
Within the findings section, the statement should read:

The CYLINDER clip is separated by a distance of 4.3 cm from the COIL
clip (at the site of biopsy-proven malignancy).

*** End of Addendum ***
FINDINGS: 3D Mammographic images were obtained following MR guided biopsy of
the 1.2 cm INNER RIGHT breast non masslike enhancement. The CYLINDER
biopsy marking clip is in expected position at the site of biopsy.

The CYLINDER clip is separated by a distance of 4.3 cm from the
RIBBON clip (at site of biopsy-proven malignancy).
IMPRESSION: Appropriate positioning of the CYLINDER shaped biopsy marking clip
at the site of biopsy in the INNER RIGHT breast.

Final Assessment: Post Procedure Mammograms for Marker Placement

## 2019-12-25 IMAGING — MR MR BREAST BX W/ LOC DEV 1ST LEASION IMAGE BX SPEC MR GUIDE*R*
9 of 12 series · 33 of 48 positions shown · IV contrast (6ml Gadavist)
Comparison: Previous exams.
COMPARISON: Previous exams.

Addendum:
CLINICAL DATA: 79-year-old female for tissue sampling of INNER
RIGHT breast non masslike enhancement. Recent diagnosis of bilateral
breast cancers.

EXAM:
MRI GUIDED CORE NEEDLE BIOPSY OF THE RIGHT BREAST
TECHNIQUE: Multiplanar, multisequence MR imaging of the RIGHT breast was
performed both before and after administration of intravenous
contrast.
CONTRAST:  6mL GADAVIST GADOBUTROL 1 MMOL/ML IV SOLN

[Series 2: fiducial unilateral · sagittal · 2.0mm · 1.33mm/px · 3 of 52 slices shown]
[im 1/52]
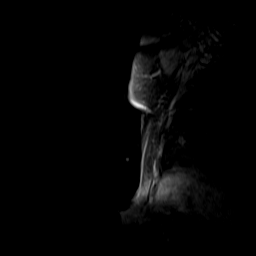
[im 26/52]
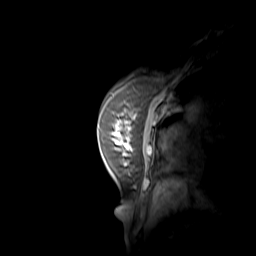
[im 52/52]
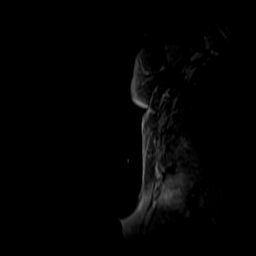

[Series 3: dynamic pre · axial · non-contrast · 1.3mm · 0.73mm/px · z∈[-52,+113]mm · 5 of 128 slices shown]
[im 1/128]
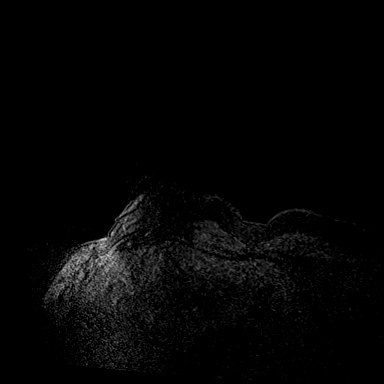
[im 32/128]
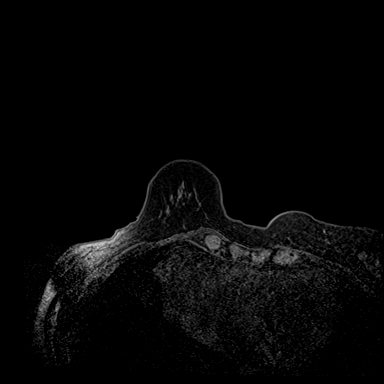
[im 64/128]
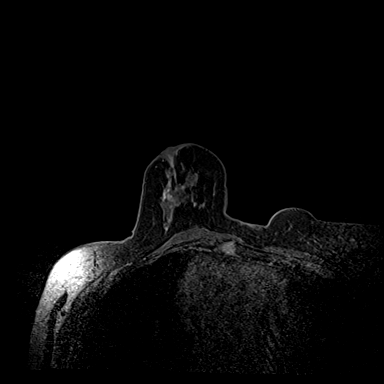
[im 96/128]
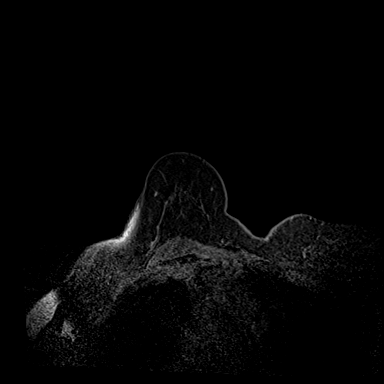
[im 128/128]
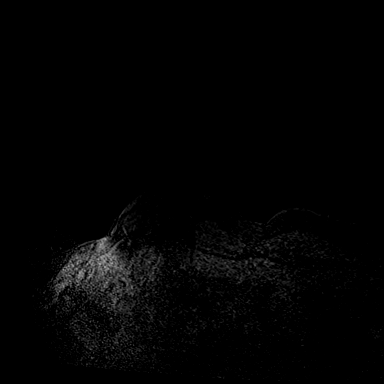

[Series 4: dynamic post 20 · axial · 1.3mm · 0.73mm/px · z∈[-52,+113]mm · 4 of 128 slices shown (1 of 2)]
[im 1/128]
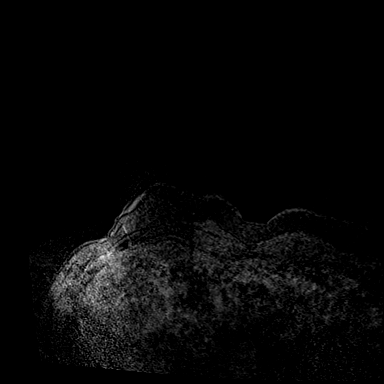
[im 43/128]
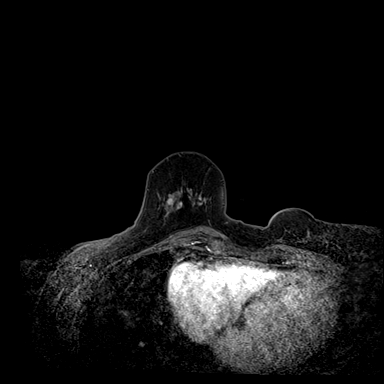
[im 85/128]
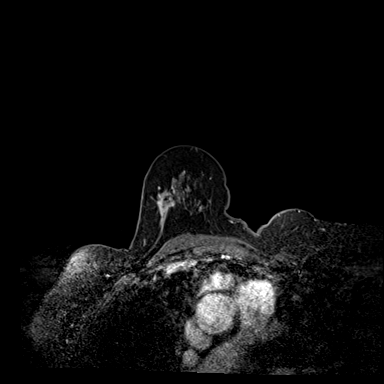
[im 128/128]
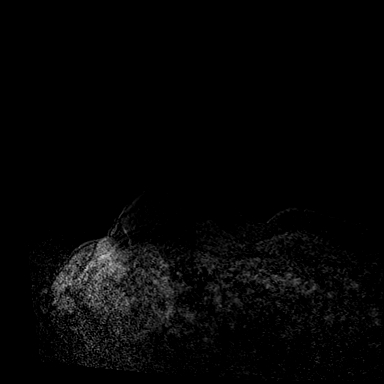

[Series 5: dynamic post 20 · axial · 1.3mm · 0.73mm/px · z∈[-52,+113]mm · 4 of 128 slices shown (2 of 2)]
[im 1/128]
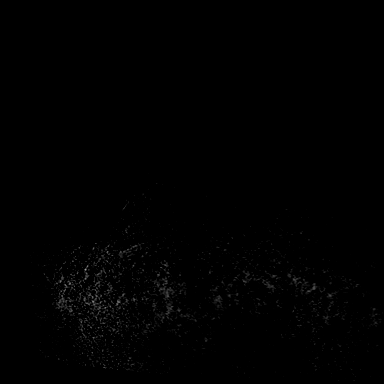
[im 43/128]
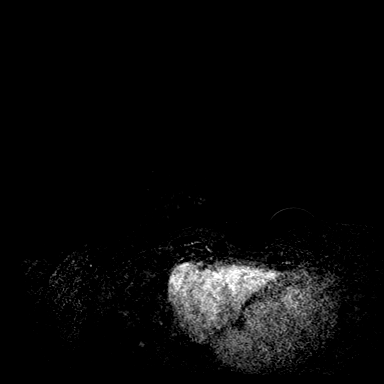
[im 85/128]
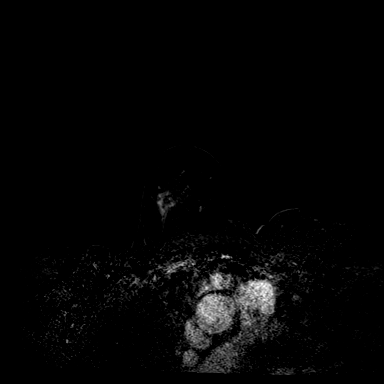
[im 128/128]
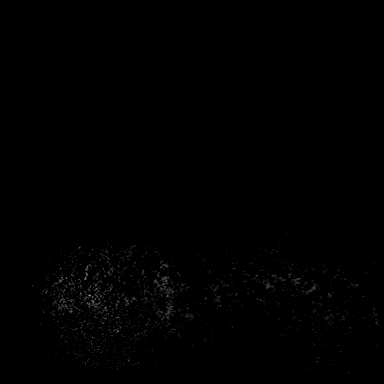

[Series 6: dynamic post 3 · axial · 1.3mm · 0.73mm/px · z∈[-52,+113]mm · 4 of 128 slices shown (1 of 2)]
[im 1/128]
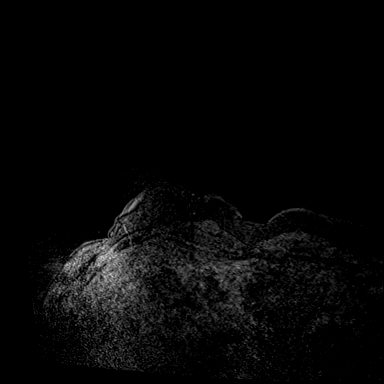
[im 43/128]
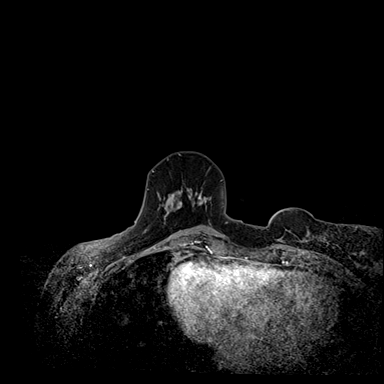
[im 85/128]
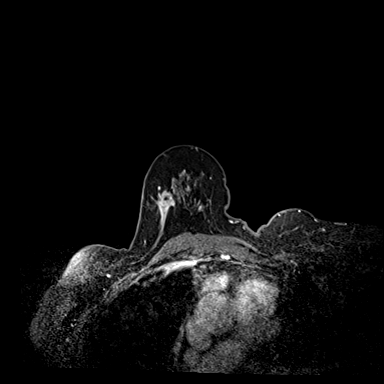
[im 128/128]
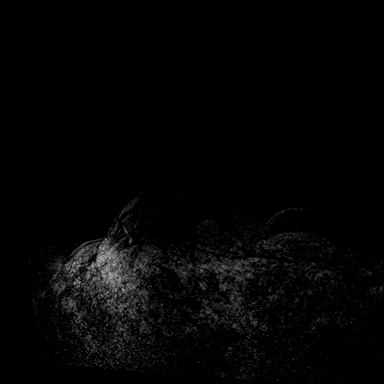

[Series 7: dynamic post 3 · axial · 1.3mm · 0.73mm/px · z∈[-52,+113]mm · 4 of 128 slices shown (2 of 2)]
[im 1/128]
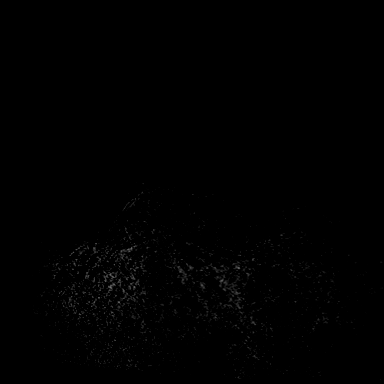
[im 43/128]
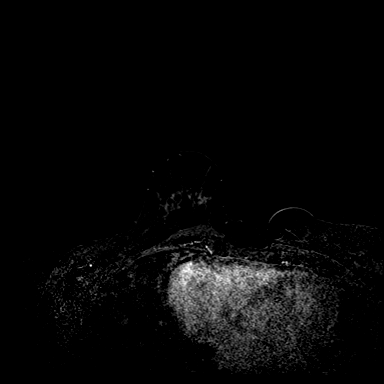
[im 85/128]
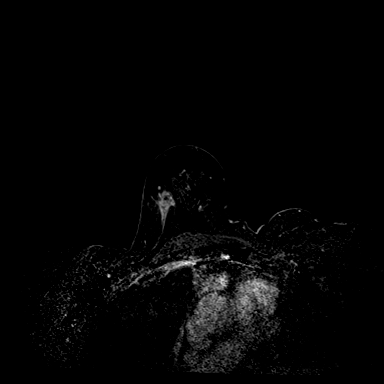
[im 128/128]
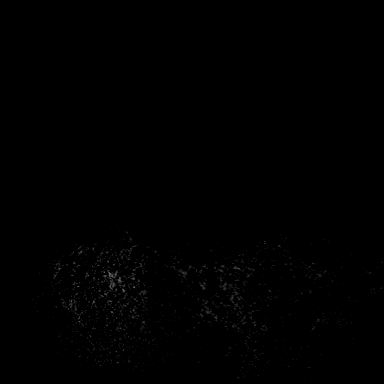

[Series 8: dynamic post 5 · axial · 1.3mm · 0.73mm/px · z∈[-52,+113]mm · 4 of 128 slices shown (1 of 2)]
[im 1/128]
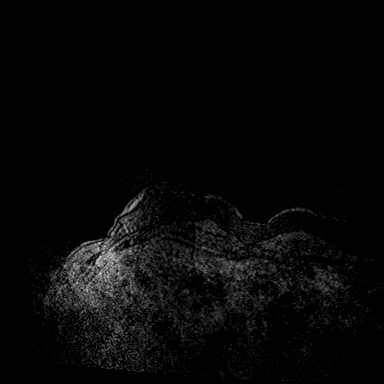
[im 43/128]
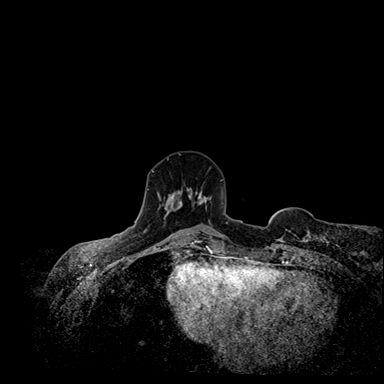
[im 85/128]
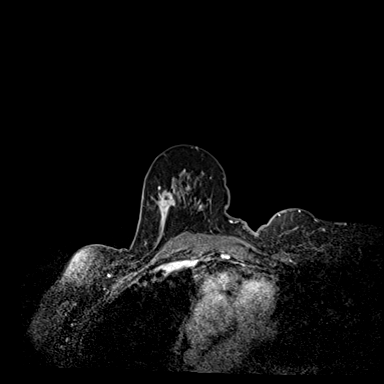
[im 128/128]
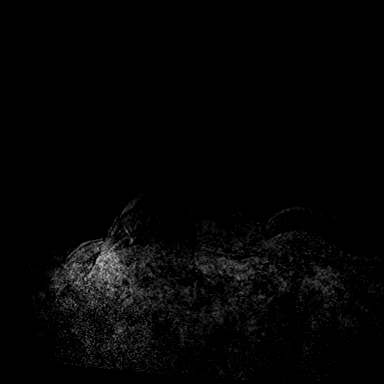

[Series 9: dynamic post 5 · axial · 1.3mm · 0.73mm/px · z∈[-52,+113]mm · 4 of 128 slices shown (2 of 2)]
[im 1/128]
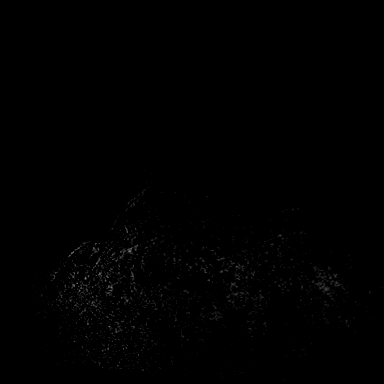
[im 43/128]
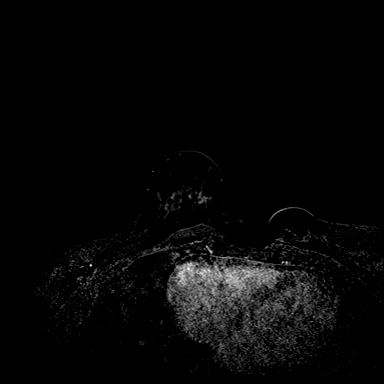
[im 85/128]
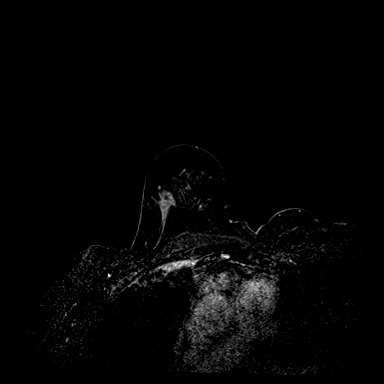
[im 128/128]
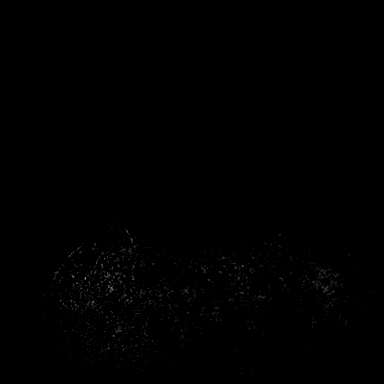

[Series 10: needle confirmation · axial · 1.3mm · 0.73mm/px · 1 of 128 slices shown]
[im 1/128]
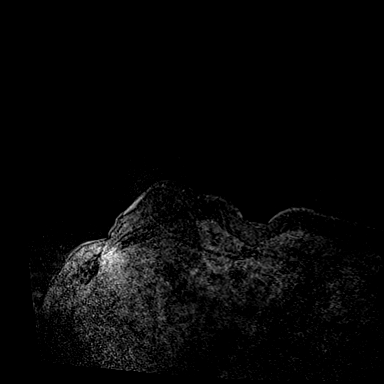

[33 of 48 positions shown; findings below may reference images not displayed]

FINDINGS: I met with the patient, and we discussed the procedure of MRI guided
biopsy, including risks, benefits, and alternatives. Specifically,
we discussed the risks of infection, bleeding, tissue injury, clip
migration, and inadequate sampling. Informed, written consent was
given. The usual time out protocol was performed immediately prior
to the procedure.

Using sterile technique, 1% Lidocaine, MRI guidance, and a 9 gauge
vacuum assisted device, biopsy was performed of the 1.2 cm area of
non masslike enhancement within the INNER RIGHT breast, middle depth
using a MEDIAL approach. At the conclusion of the procedure, a
CYLINDER tissue marker clip was deployed into the biopsy cavity.
Follow-up 2-view mammogram was performed and dictated separately.
IMPRESSION: MRI guided biopsy of INNER RIGHT breast non masslike enhancement. No
apparent complications.

ADDENDUM:
Pathology revealed DUCTAL CARCINOMA IN SITU WITH CALCIFICATIONS,
COMPLEX SCLEROSING LESION WITH USUAL DUCTAL HYPERPLASIA AND
CALCIFICATIONS of the RIGHT breast, inner. This was found to be
concordant by Dr. FRANCK.

Pathology results were discussed with the patient by telephone. The
patient reported doing well after the biopsy with tenderness at the
site. Post biopsy instructions and care were reviewed and questions
were answered. The patient was encouraged to call The [REDACTED] for any additional concerns. My direct phone
number was provided.

The patient has a recent diagnosis of BILATERAL breast cancer and
should follow her outlined treatment plan.

Dr. FRANCK was notified of biopsy results via [REDACTED] message
on [DATE].

Pathology results reported by FRANCK, RN on [DATE].

*** End of Addendum ***
FINDINGS: I met with the patient, and we discussed the procedure of MRI guided
biopsy, including risks, benefits, and alternatives. Specifically,
we discussed the risks of infection, bleeding, tissue injury, clip
migration, and inadequate sampling. Informed, written consent was
given. The usual time out protocol was performed immediately prior
to the procedure.

Using sterile technique, 1% Lidocaine, MRI guidance, and a 9 gauge
vacuum assisted device, biopsy was performed of the 1.2 cm area of
non masslike enhancement within the INNER RIGHT breast, middle depth
using a MEDIAL approach. At the conclusion of the procedure, a
CYLINDER tissue marker clip was deployed into the biopsy cavity.
Follow-up 2-view mammogram was performed and dictated separately.
IMPRESSION: MRI guided biopsy of INNER RIGHT breast non masslike enhancement. No
apparent complications.

## 2019-12-25 MED ORDER — GADOBUTROL 1 MMOL/ML IV SOLN
6.0000 mL | Freq: Once | INTRAVENOUS | Status: AC | PRN
  Administered 2019-12-25: 6 mL via INTRAVENOUS

## 2019-12-28 ENCOUNTER — Ambulatory Visit: Payer: Self-pay | Admitting: Surgery

## 2019-12-28 ENCOUNTER — Encounter: Payer: Self-pay | Admitting: *Deleted

## 2019-12-28 DIAGNOSIS — D0512 Intraductal carcinoma in situ of left breast: Secondary | ICD-10-CM

## 2019-12-28 NOTE — H&P (Signed)
Pt had a left sided focus of DCIS  Want breast conserving surgery  Significant area but not unreasonable to try Understands re excision risk and need for mastectomy if needed

## 2020-01-01 DIAGNOSIS — I10 Essential (primary) hypertension: Secondary | ICD-10-CM | POA: Diagnosis not present

## 2020-01-01 DIAGNOSIS — D0510 Intraductal carcinoma in situ of unspecified breast: Secondary | ICD-10-CM | POA: Diagnosis not present

## 2020-01-01 DIAGNOSIS — R5383 Other fatigue: Secondary | ICD-10-CM | POA: Diagnosis not present

## 2020-01-06 ENCOUNTER — Telehealth: Payer: Self-pay | Admitting: Radiation Oncology

## 2020-01-06 ENCOUNTER — Ambulatory Visit: Payer: Medicare HMO | Admitting: Radiation Oncology

## 2020-01-07 ENCOUNTER — Ambulatory Visit: Payer: Medicare HMO | Admitting: Radiation Oncology

## 2020-01-12 ENCOUNTER — Encounter: Payer: Self-pay | Admitting: *Deleted

## 2020-01-12 ENCOUNTER — Other Ambulatory Visit: Payer: Self-pay | Admitting: Surgery

## 2020-01-12 DIAGNOSIS — C50911 Malignant neoplasm of unspecified site of right female breast: Secondary | ICD-10-CM

## 2020-01-12 DIAGNOSIS — Z170 Estrogen receptor positive status [ER+]: Secondary | ICD-10-CM

## 2020-01-12 DIAGNOSIS — C50411 Malignant neoplasm of upper-outer quadrant of right female breast: Secondary | ICD-10-CM

## 2020-01-12 DIAGNOSIS — D0512 Intraductal carcinoma in situ of left breast: Secondary | ICD-10-CM

## 2020-01-15 ENCOUNTER — Telehealth: Payer: Self-pay | Admitting: Hematology and Oncology

## 2020-01-15 NOTE — Telephone Encounter (Signed)
Scheduled appts per 11/23 sch msg. Pt confirmed appt date and time.

## 2020-01-19 ENCOUNTER — Telehealth: Payer: Self-pay | Admitting: Internal Medicine

## 2020-01-19 NOTE — Telephone Encounter (Signed)
Copied from 11/27/19 cxr results:  CXR- shows some hazy areas. Infection can't be ruled out. I will send script for Zpak to her drug store and we will repeat the CXR in a month  Order- future CXR in 1 month  dx Bronchectasis  Left VM for patient to return call regarding above.  Will await return call. ----------------  Pt did not have follow up cxr. lmtcb for pt- need to see if she is still symptomatic or why she is requesting abx.

## 2020-01-21 ENCOUNTER — Encounter: Payer: Self-pay | Admitting: *Deleted

## 2020-01-26 NOTE — Telephone Encounter (Signed)
Patient would like refill for Azithromycin called into pharmacy. Pharmacy is [facility]. Patient phone number is [phone].

## 2020-01-26 NOTE — Telephone Encounter (Signed)
lmtcb for pt.  

## 2020-01-28 NOTE — Telephone Encounter (Signed)
Pt returning call again and can be reached @ [phone].[name]

## 2020-02-01 ENCOUNTER — Other Ambulatory Visit (HOSPITAL_COMMUNITY)
Admission: RE | Admit: 2020-02-01 | Discharge: 2020-02-01 | Disposition: A | Discharge status: Home / Self Care | Payer: Medicare HMO | Source: Ambulatory Visit | Attending: Surgery | Admitting: Surgery

## 2020-02-01 ENCOUNTER — Other Ambulatory Visit: Payer: Self-pay

## 2020-02-01 ENCOUNTER — Encounter (HOSPITAL_BASED_OUTPATIENT_CLINIC_OR_DEPARTMENT_OTHER): Payer: Self-pay | Admitting: Surgery

## 2020-02-01 DIAGNOSIS — Z01812 Encounter for preprocedural laboratory examination: Secondary | ICD-10-CM | POA: Diagnosis not present

## 2020-02-01 DIAGNOSIS — Z20822 Contact with and (suspected) exposure to covid-19: Secondary | ICD-10-CM | POA: Diagnosis not present

## 2020-02-01 NOTE — Progress Notes (Signed)
Spoke to pt for anesthesia pre-op call.  Pt with hx of COPD with current symptoms of sinus congestion, runny nose, and cough.  Pt denies SOB. Chart reviewed with Dr. Conrad Carson.  Decision to move pt to Main OR based on above and need for radioactive seeds for surgery.  Debi Lisi at Dr. Josetta Huddle office notified and patient notified.

## 2020-02-02 ENCOUNTER — Other Ambulatory Visit (HOSPITAL_COMMUNITY)
Admission: RE | Admit: 2020-02-02 | Discharge: 2020-02-02 | Disposition: A | Discharge status: Home / Self Care | Payer: Medicare HMO | Source: Ambulatory Visit | Attending: Surgery | Admitting: Surgery

## 2020-02-02 DIAGNOSIS — Z170 Estrogen receptor positive status [ER+]: Secondary | ICD-10-CM | POA: Insufficient documentation

## 2020-02-02 DIAGNOSIS — C50911 Malignant neoplasm of unspecified site of right female breast: Secondary | ICD-10-CM | POA: Insufficient documentation

## 2020-02-02 LAB — COMPREHENSIVE METABOLIC PANEL
ALT: 22 U/L (ref 0–44)
AST: 30 U/L (ref 15–41)
Albumin: 4.1 g/dL (ref 3.5–5.0)
Alkaline Phosphatase: 83 U/L (ref 38–126)
Anion gap: 10 (ref 5–15)
BUN: 12 mg/dL (ref 8–23)
CO2: 26 mmol/L (ref 22–32)
Calcium: 9.8 mg/dL (ref 8.9–10.3)
Chloride: 104 mmol/L (ref 98–111)
Creatinine, Ser: 0.87 mg/dL (ref 0.44–1.00)
GFR, Estimated: >60 mL/min (ref >60)
Glucose, Bld: 100 mg/dL — ABNORMAL HIGH (ref 70–99)
Potassium: 4 mmol/L (ref 3.5–5.1)
Sodium: 140 mmol/L (ref 135–145)
Total Bilirubin: 0.7 mg/dL (ref 0.3–1.2)
Total Protein: 7.8 g/dL (ref 6.5–8.1)

## 2020-02-02 LAB — CBC WITH DIFFERENTIAL/PLATELET
Abs Immature Granulocytes: 0.01 10*3/uL (ref 0.00–0.07)
Basophils Absolute: 0.1 10*3/uL (ref 0.0–0.1)
Basophils Relative: 1 %
Eosinophils Absolute: 0.2 10*3/uL (ref 0.0–0.5)
Eosinophils Relative: 3 %
HCT: 44.8 % (ref 36.0–46.0)
Hemoglobin: 14.7 g/dL (ref 12.0–15.0)
Immature Granulocytes: 0 %
Lymphocytes Relative: 33 %
Lymphs Abs: 2 10*3/uL (ref 0.7–4.0)
MCH: 32.2 pg (ref 26.0–34.0)
MCHC: 32.8 g/dL (ref 30.0–36.0)
MCV: 98.2 fL (ref 80.0–100.0)
Monocytes Absolute: 0.6 10*3/uL (ref 0.1–1.0)
Monocytes Relative: 10 %
Neutro Abs: 3.2 10*3/uL (ref 1.7–7.7)
Neutrophils Relative %: 53 %
Platelets: 241 10*3/uL (ref 150–400)
RBC: 4.56 MIL/uL (ref 3.87–5.11)
RDW: 13 % (ref 11.5–15.5)
WBC: 6 10*3/uL (ref 4.0–10.5)
nRBC: 0 % (ref 0.0–0.2)

## 2020-02-02 LAB — SARS CORONAVIRUS 2 (TAT 6-24 HRS): SARS Coronavirus 2: NEGATIVE

## 2020-02-02 NOTE — Progress Notes (Signed)
[place], [name] - [address] [place] [name] [address] Phone: [phone] Fax: [phone]      Your procedure is scheduled on Thursday December 16  Report to [facility] Main Entrance "A" at 1200 P.M., and check in at the Admitting office.  Call this number if you have problems the morning of surgery:  [phone]  Call [phone] if you have any questions prior to your surgery date Monday-Friday 8am-4pm    Remember:  Do not eat after midnight the night before your surgery  You may drink clear liquids until 1100 am the morning of your surgery.   Clear liquids allowed are: Water, Non-Citrus Juices (without pulp), Carbonated Beverages, Clear Tea, Black Coffee Only, and Gatorade    Take these medicines the morning of surgery with A SIP OF WATER  albuterol (VENTOLIN HFA) if needed, Please bring all inhalers with you the day of surgery.  azelastine (ASTELIN) nasal spray if needed citalopram (CELEXA) Eye drops if needed fluticasone (FLONASE) if needed levothyroxine (SYNTHROID, LEVOTHROID)  As of today, STOP taking any Aspirin (unless otherwise instructed by your surgeon) Aleve, Naproxen, Ibuprofen, Motrin, Advil, Goody's, BC's, all herbal medications, fish oil, and all vitamins.                      Do not wear jewelry, make up, or nail polish            Do not wear lotions, powders, perfumes, or deodorant.            Do not shave 48 hours prior to surgery.              Do not bring valuables to the hospital.            [facility] is not responsible for any belongings or valuables.  Do NOT Smoke (Tobacco/Vaping) or drink Alcohol 24 hours prior to your procedure If you use a CPAP at night, you may bring all equipment for your overnight stay.   Contacts, glasses, dentures or bridgework may not be worn into surgery.      For patients admitted to the hospital, discharge time will be determined by your treatment team.    Patients discharged the day of surgery will not be allowed to drive home, and someone needs to stay with them for 24 hours.    Special instructions:   [place]- Preparing For Surgery  Before surgery, you can play an important role. Because skin is not sterile, your skin needs to be as free of germs as possible. You can reduce the number of germs on your skin by washing with CHG (chlorahexidine gluconate) Soap before surgery.  CHG is an antiseptic cleaner which kills germs and bonds with the skin to continue killing germs even after washing.    Oral Hygiene is also important to reduce your risk of infection.  Remember - BRUSH YOUR TEETH THE MORNING OF SURGERY WITH YOUR REGULAR TOOTHPASTE  Please do not use if you have an allergy to CHG or antibacterial soaps. If your skin becomes reddened/irritated stop using the CHG.  Do not shave (including legs and underarms) for at least 48 hours prior to first CHG shower. It is OK to shave your face.  Please follow these instructions carefully.   1. Shower the NIGHT BEFORE SURGERY and the MORNING OF SURGERY with CHG Soap.   2. If you chose to wash your hair, wash your hair first as usual with  your normal shampoo.  3. After you shampoo, rinse your hair and body thoroughly to remove the shampoo.  4. Use CHG as you would any other liquid soap. You can apply CHG directly to the skin and wash gently with a scrungie or a clean washcloth.   5. Apply the CHG Soap to your body ONLY FROM THE NECK DOWN.  Do not use on open wounds or open sores. Avoid contact with your eyes, ears, mouth and genitals (private parts). Wash Face and genitals (private parts)  with your normal soap.   6. Wash thoroughly, paying special attention to the area where your surgery will be performed.  7. Thoroughly rinse your body with warm water from the neck down.  8. DO NOT shower/wash with your normal soap after using and rinsing off the CHG Soap.  9. Pat yourself dry with a  CLEAN TOWEL.  10. Wear CLEAN PAJAMAS to bed the night before surgery  11. Place CLEAN SHEETS on your bed the night of your first shower and DO NOT SLEEP WITH PETS.   Day of Surgery: Wear Clean/Comfortable clothing the morning of surgery Do not apply any deodorants/lotions.   Remember to brush your teeth WITH YOUR REGULAR TOOTHPASTE.   Please read over the following fact sheets that you were given.

## 2020-02-03 ENCOUNTER — Ambulatory Visit
Admission: RE | Admit: 2020-02-03 | Discharge: 2020-02-03 | Disposition: A | Discharge status: Home / Self Care | Payer: Medicare HMO | Source: Ambulatory Visit | Attending: Surgery | Admitting: Surgery

## 2020-02-03 ENCOUNTER — Other Ambulatory Visit: Payer: Self-pay

## 2020-02-03 DIAGNOSIS — C50911 Malignant neoplasm of unspecified site of right female breast: Secondary | ICD-10-CM

## 2020-02-03 DIAGNOSIS — C50912 Malignant neoplasm of unspecified site of left female breast: Secondary | ICD-10-CM | POA: Diagnosis not present

## 2020-02-03 DIAGNOSIS — D0512 Intraductal carcinoma in situ of left breast: Secondary | ICD-10-CM

## 2020-02-03 IMAGING — MG MM PLC BREAST LOC DEV EA ADD LEASION INC MAMMO GUIDE*R*
6 series · 6 of 6 positions shown · non-contrast
Comparison: Previous exam(s).

CLINICAL DATA: Localization of 3 known sites of breast cancer
bilaterally.

EXAM:
MAMMOGRAPHIC GUIDED RADIOACTIVE SEED LOCALIZATION OF THE BILATERAL
BREAST

[R CC (1 of 3)]
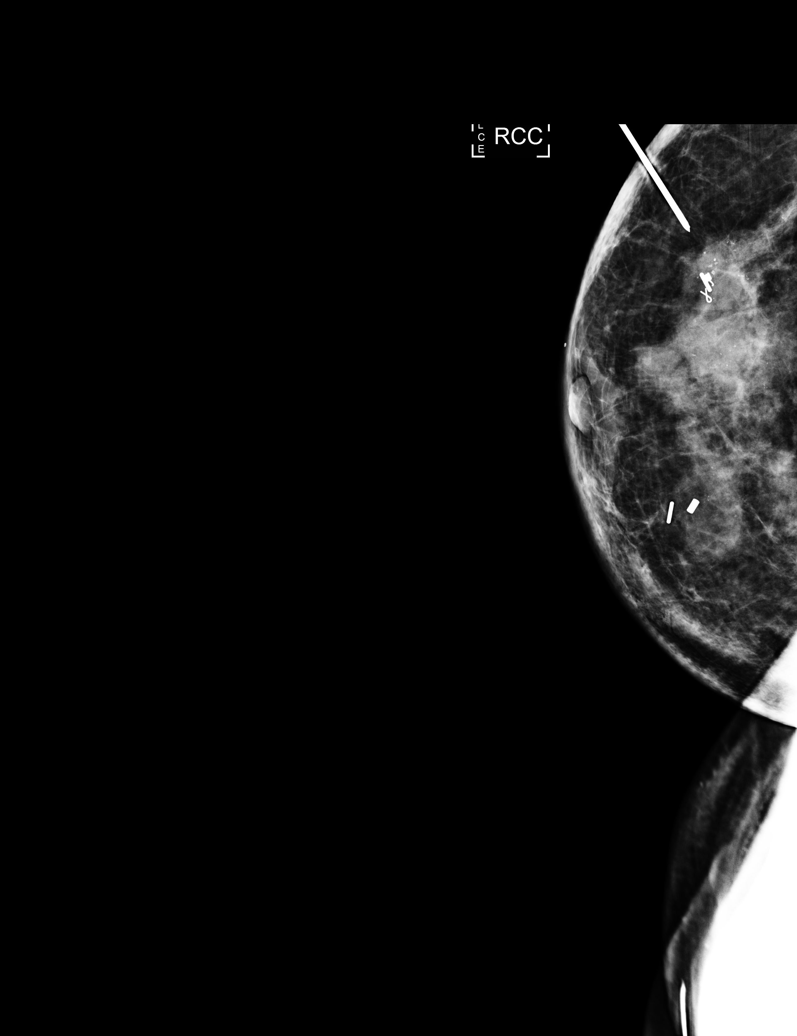

[R CC (2 of 3)]
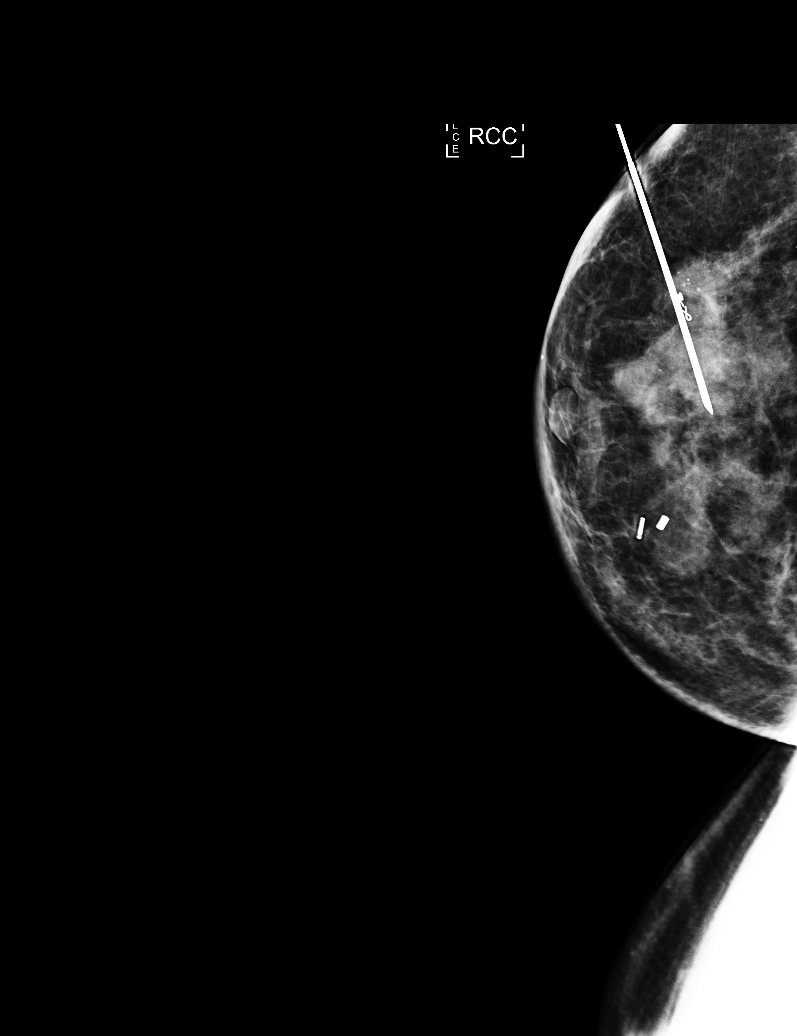

[R CC (3 of 3)]
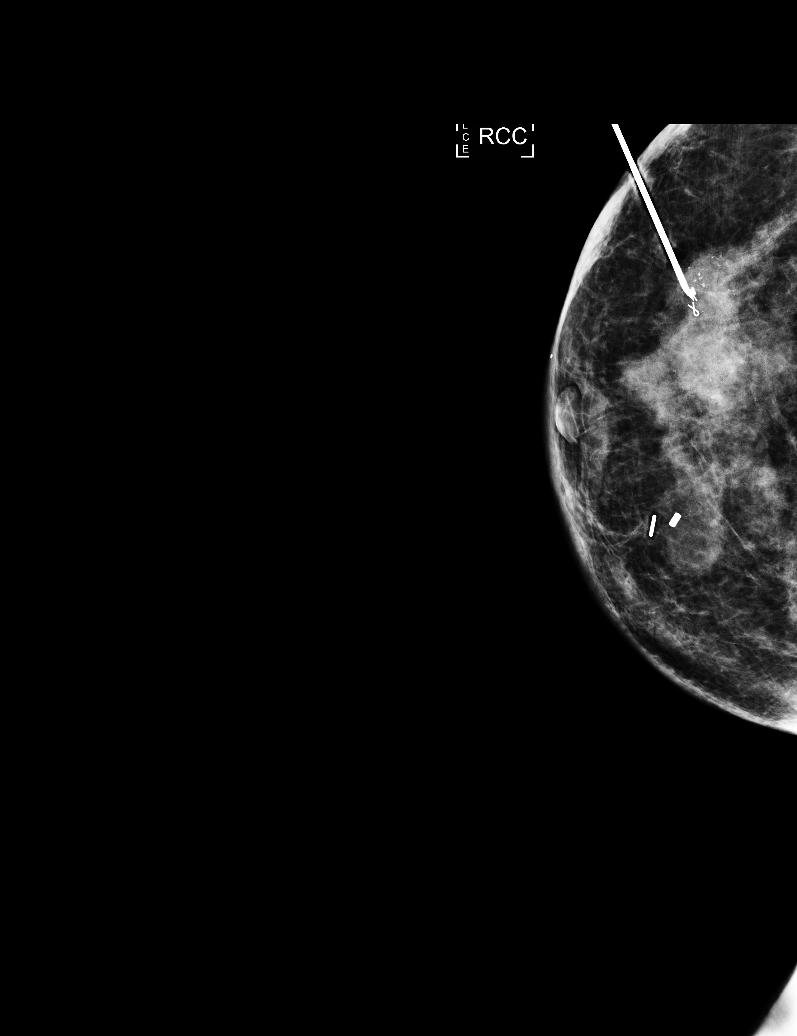

[R LM (1 of 3)]
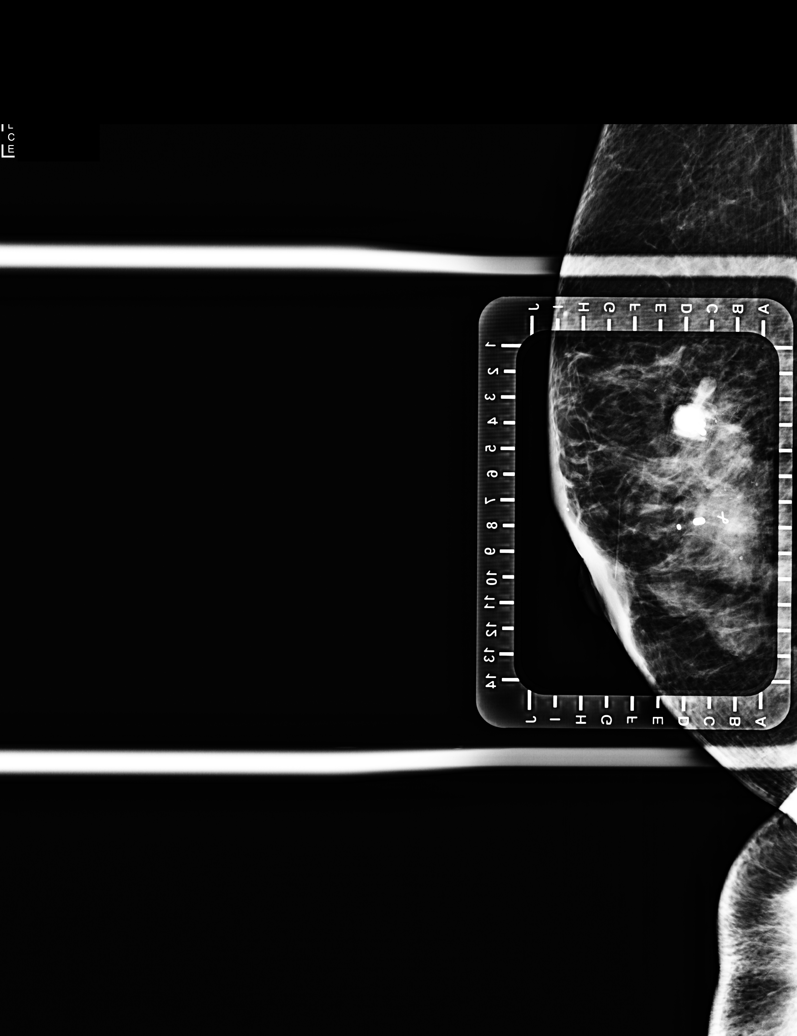

[R LM (2 of 3)]
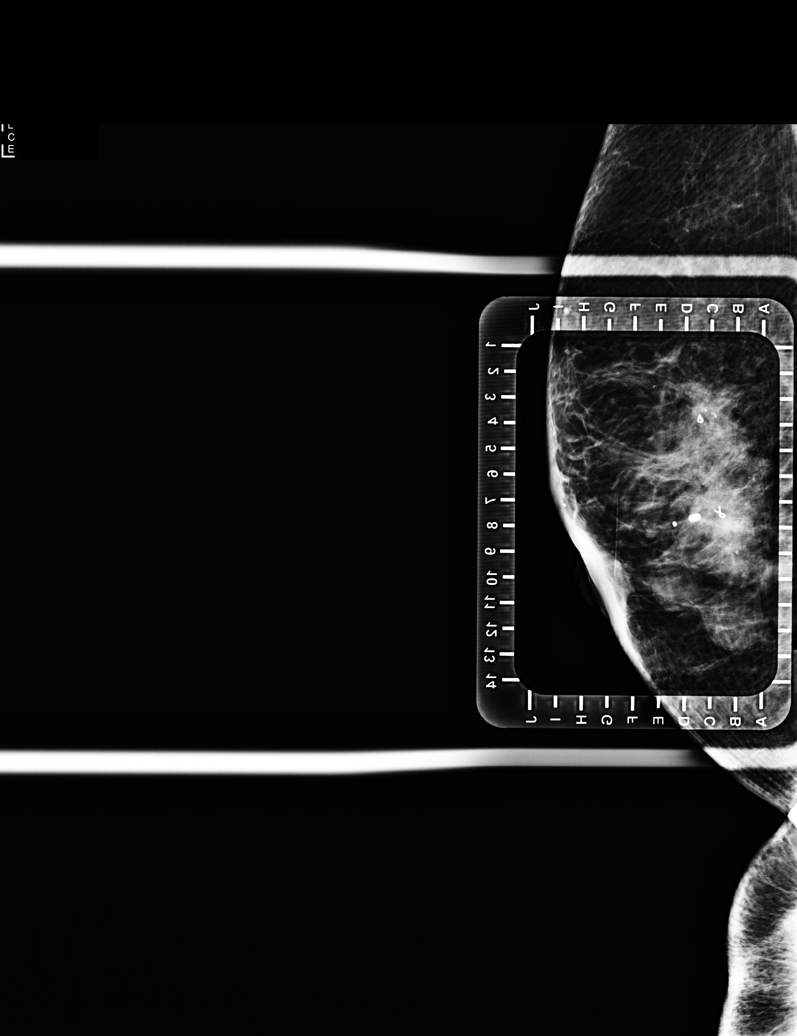

[R LM (3 of 3)]
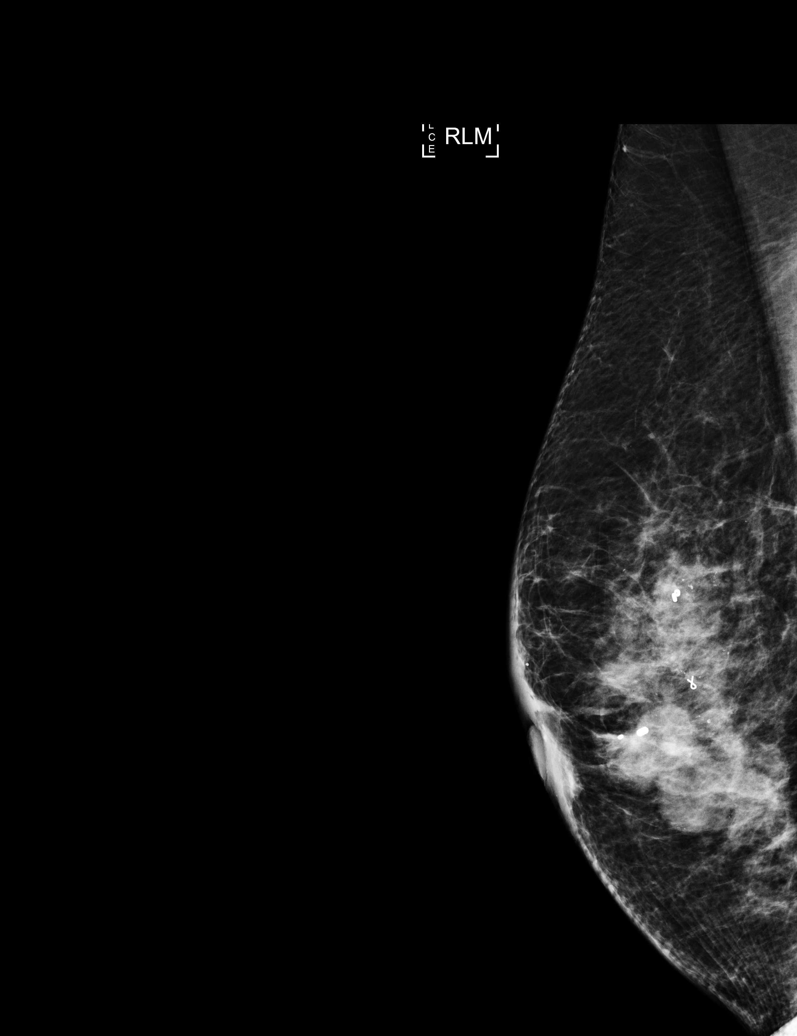

[6 of 6 positions shown; findings below may reference images not displayed]



The usual time-out protocol was performed immediately prior to the
procedure.

Using mammographic guidance, sterile technique, 1% lidocaine and an
[WX] radioactive seed, the left-sided biopsy clip was localized
using a lateral approach. The follow-up mammogram images confirm the
seed in the expected location.

Follow-up survey of the patient confirms presence of the radioactive
seed.

Order number of [WX] seed:  [PHONE_NUMBER].

Total activity:  0.243 millicuries reference Date: [DATE]

Using mammographic guidance, sterile technique, 1% lidocaine and an
[WX] radioactive seed, the medial cylinder shaped clip was
localized using a medial approach. The follow-up mammogram images
confirm the seed in the expected location.

Follow-up survey of the patient confirms presence of the radioactive
seed.

Order number of [WX] seed:  [PHONE_NUMBER].

Total activity:  0.258 millicuries reference Date: [DATE]

Using mammographic guidance, sterile technique, 1% lidocaine and an
[WX] radioactive seed, the lateral coil shaped clip was localized
using a lateral approach. The follow-up mammogram images confirm the
seed in the expected location.

Follow-up survey of the patient confirms presence of the radioactive
seed.

Order number of [WX] seed:  [PHONE_NUMBER].

Total activity:  0.258 millicurie reference Date: [DATE]

The patient tolerated the procedure well and was released from the
[REDACTED]. She was given instructions regarding seed removal.
IMPRESSION: Radioactive seed localizations in both breasts. No apparent
complications.

## 2020-02-03 IMAGING — MG MM PLC BREAST LOC DEV 1ST LESION INC MAMMO GUIDE*L*
7 series · 7 of 7 positions shown · non-contrast
Comparison: Previous exam(s).

CLINICAL DATA: Localization of 3 known sites of breast cancer
bilaterally.

EXAM:
MAMMOGRAPHIC GUIDED RADIOACTIVE SEED LOCALIZATION OF THE BILATERAL
BREAST

[L CC (1 of 4)]
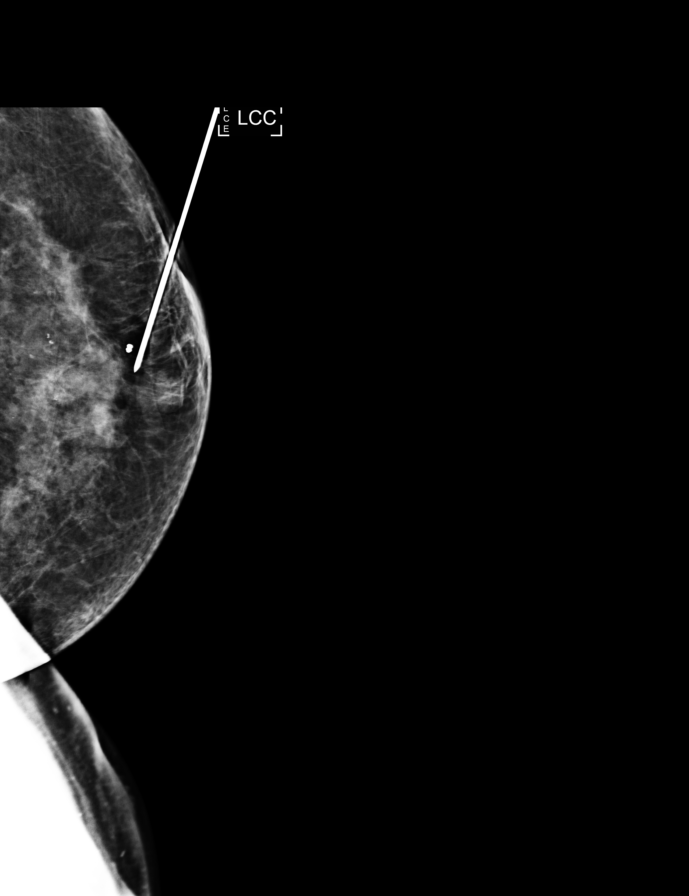

[L LM (1 of 3)]
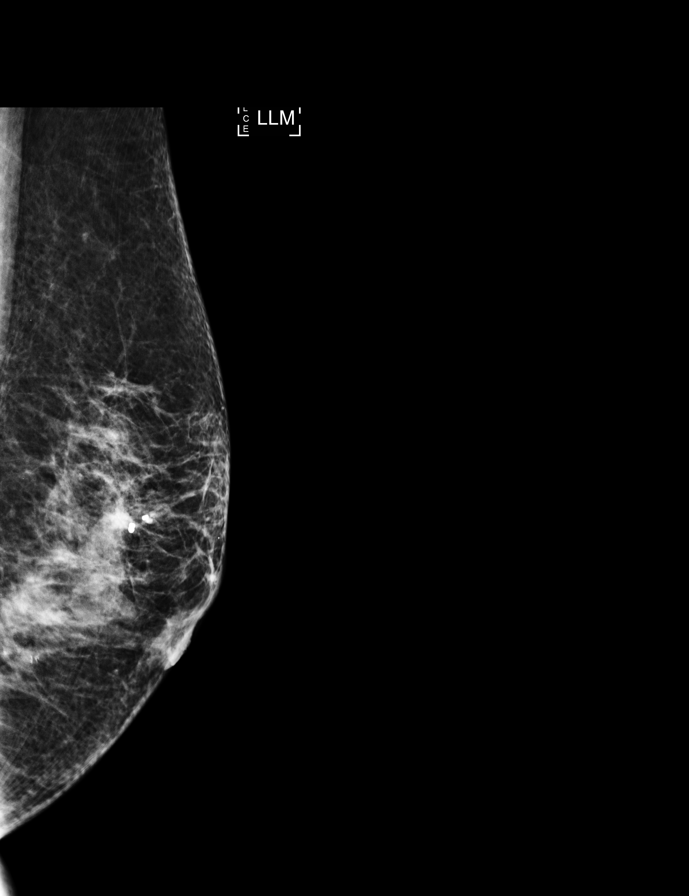

[L CC (2 of 4)]
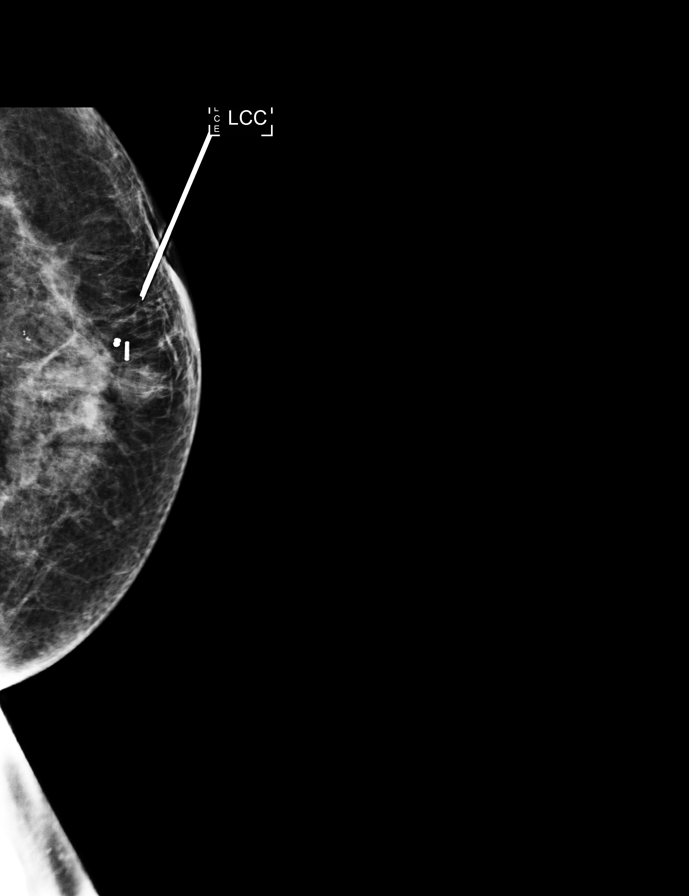

[L LM (2 of 3)]
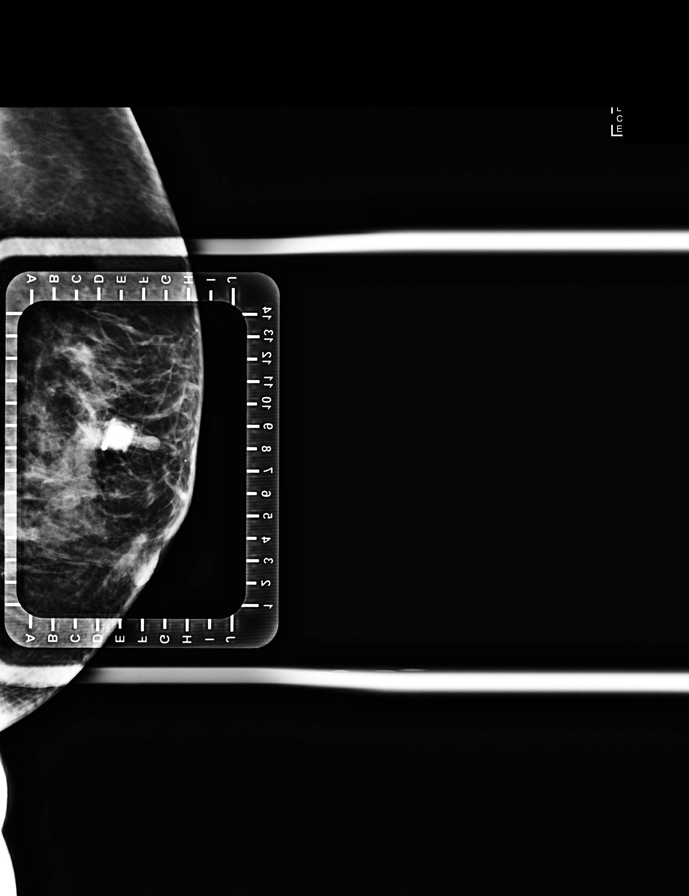

[L CC (3 of 4)]
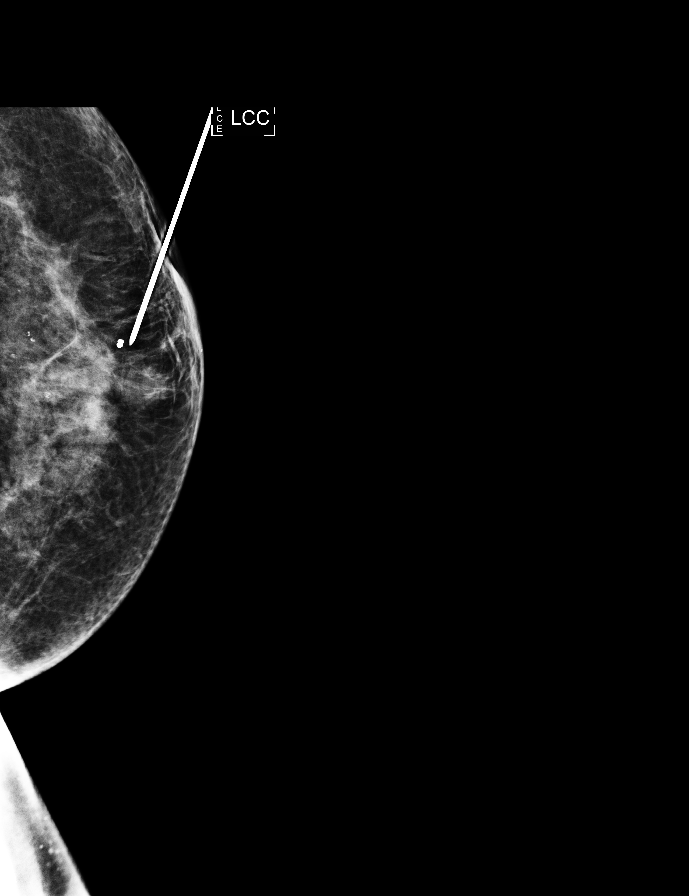

[L CC (4 of 4)]
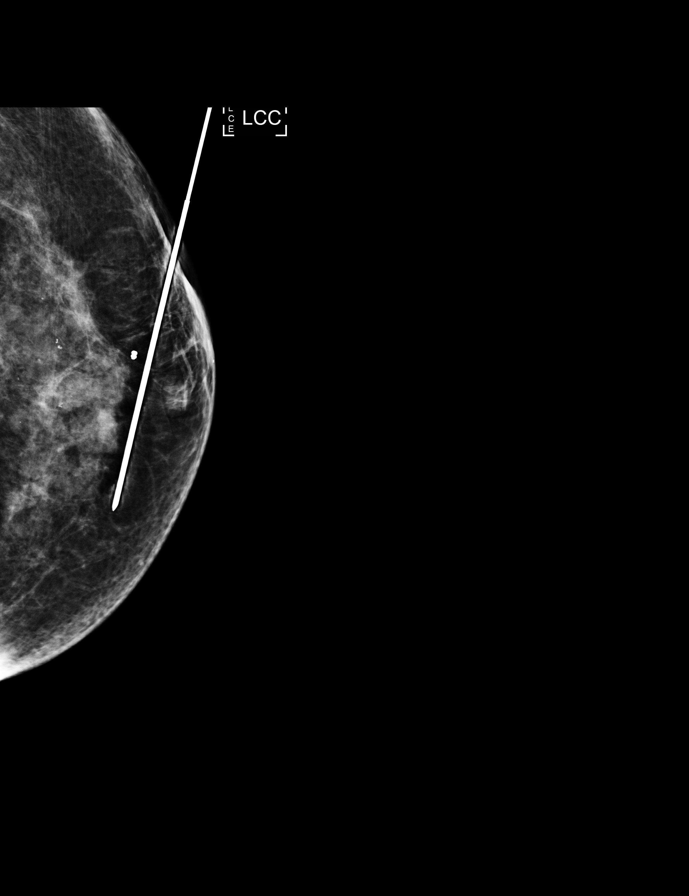

[L LM (3 of 3)]
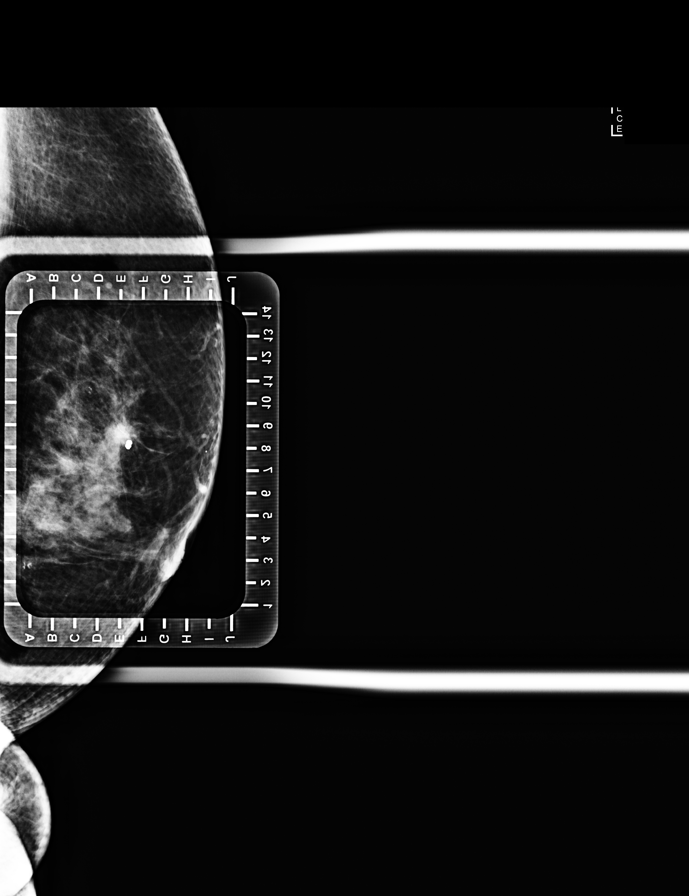

[7 of 7 positions shown; findings below may reference images not displayed]



The usual time-out protocol was performed immediately prior to the
procedure.

Using mammographic guidance, sterile technique, 1% lidocaine and an
[WX] radioactive seed, the left-sided biopsy clip was localized
using a lateral approach. The follow-up mammogram images confirm the
seed in the expected location.

Follow-up survey of the patient confirms presence of the radioactive
seed.

Order number of [WX] seed:  [PHONE_NUMBER].

Total activity:  0.243 millicuries reference Date: [DATE]

Using mammographic guidance, sterile technique, 1% lidocaine and an
[WX] radioactive seed, the medial cylinder shaped clip was
localized using a medial approach. The follow-up mammogram images
confirm the seed in the expected location.

Follow-up survey of the patient confirms presence of the radioactive
seed.

Order number of [WX] seed:  [PHONE_NUMBER].

Total activity:  0.258 millicuries reference Date: [DATE]

Using mammographic guidance, sterile technique, 1% lidocaine and an
[WX] radioactive seed, the lateral coil shaped clip was localized
using a lateral approach. The follow-up mammogram images confirm the
seed in the expected location.

Follow-up survey of the patient confirms presence of the radioactive
seed.

Order number of [WX] seed:  [PHONE_NUMBER].

Total activity:  0.258 millicurie reference Date: [DATE]

The patient tolerated the procedure well and was released from the
[REDACTED]. She was given instructions regarding seed removal.
IMPRESSION: Radioactive seed localizations in both breasts. No apparent
complications.

## 2020-02-03 IMAGING — MG MM PLC BREAST LOC DEV 1ST LESION INC*R*
5 series · 5 of 5 positions shown · non-contrast
Comparison: Previous exam(s).

CLINICAL DATA: Localization of 3 known sites of breast cancer
bilaterally.

EXAM:
MAMMOGRAPHIC GUIDED RADIOACTIVE SEED LOCALIZATION OF THE BILATERAL
BREAST

[R CC (1 of 3)]
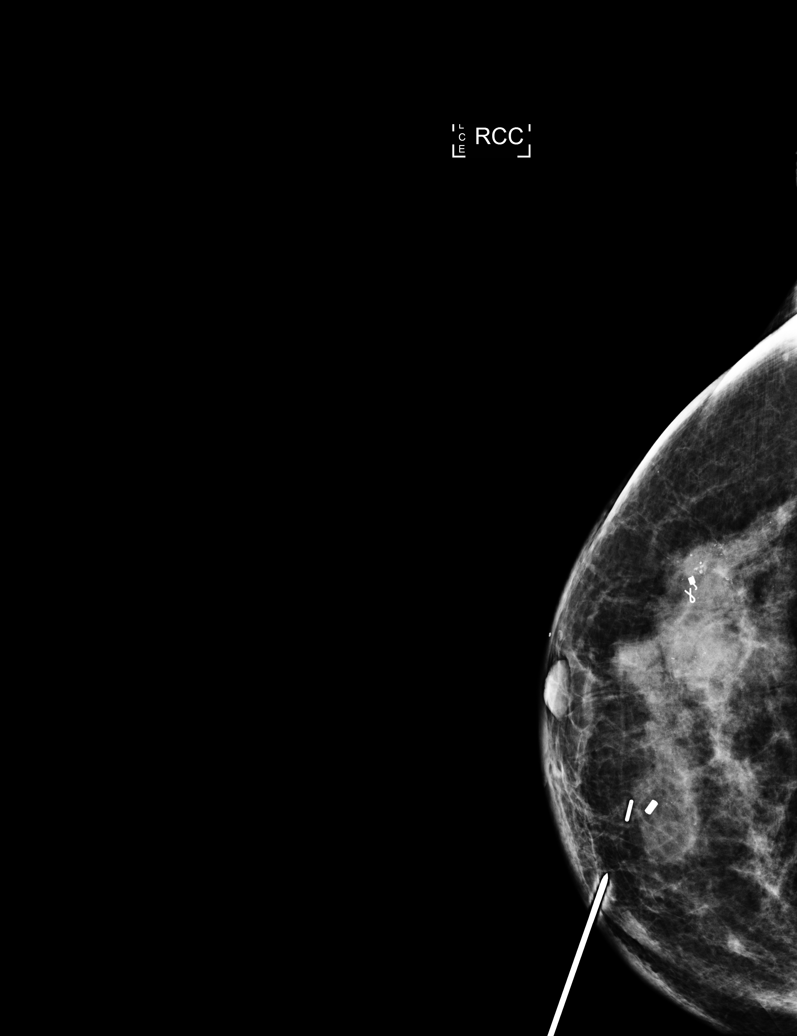

[R ML (1 of 2)]
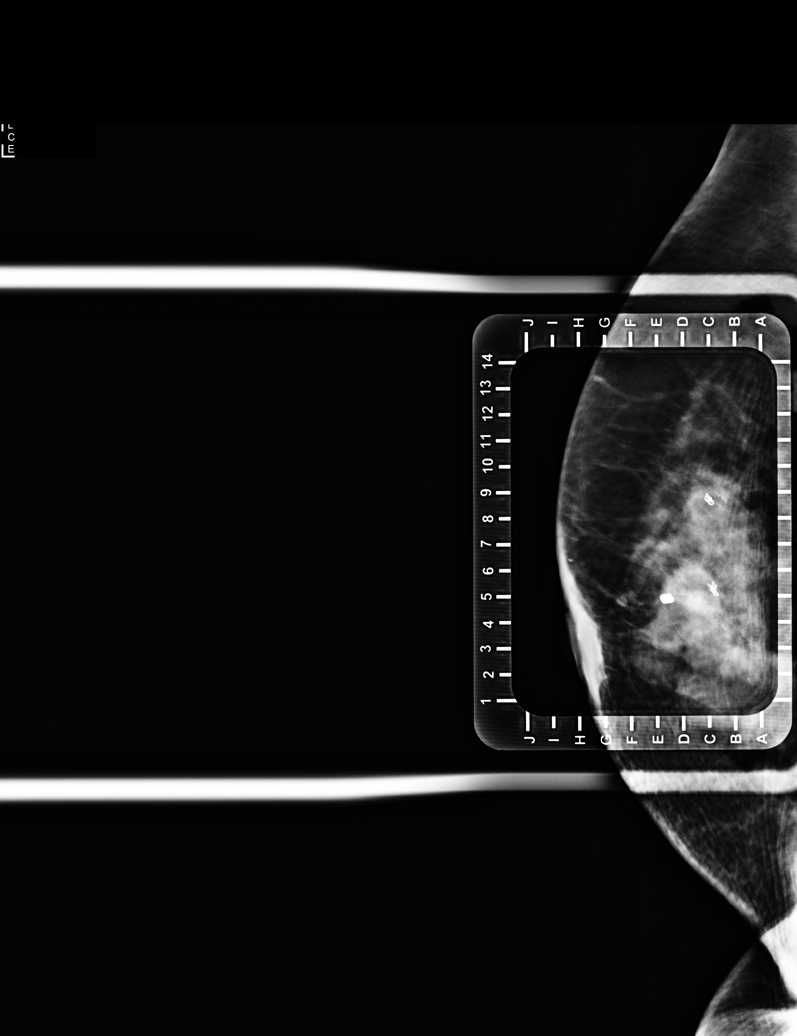

[R CC (2 of 3)]
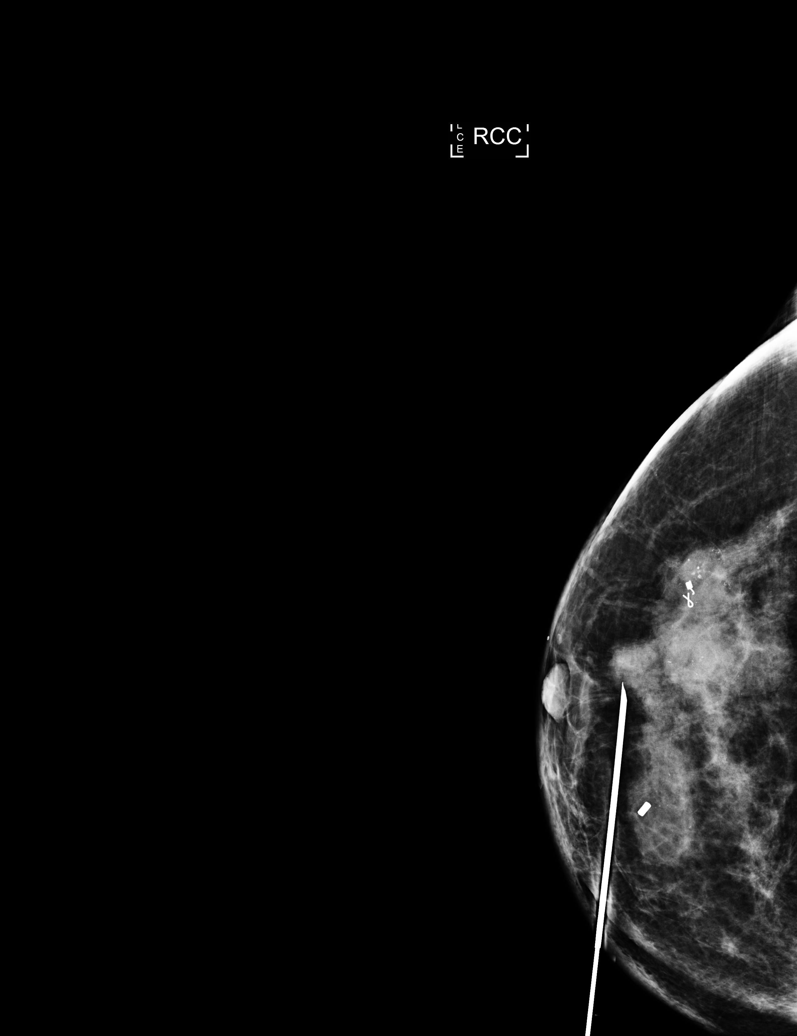

[R CC (3 of 3)]
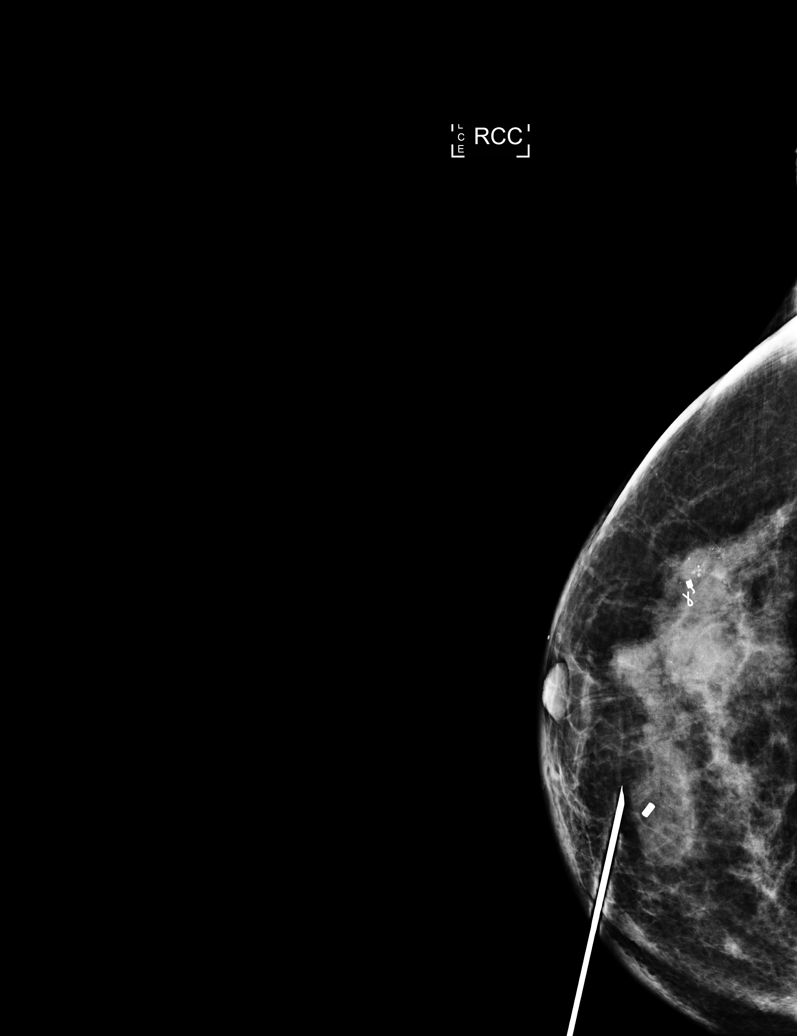

[R ML (2 of 2)]
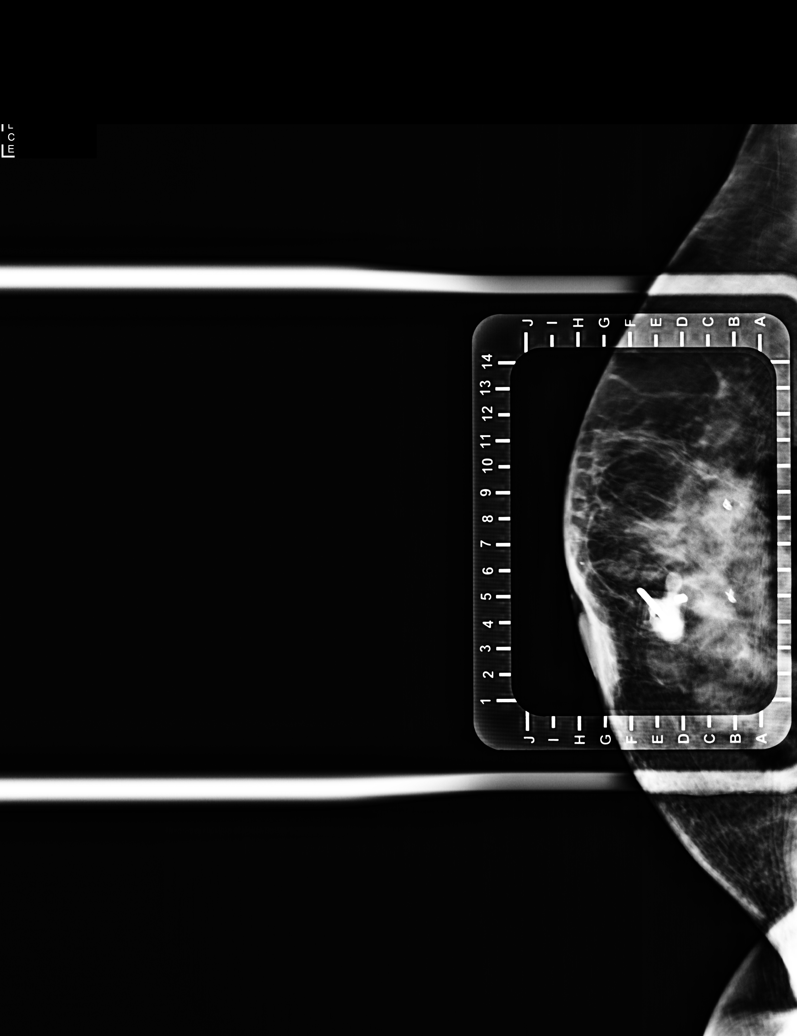

[5 of 5 positions shown; findings below may reference images not displayed]



The usual time-out protocol was performed immediately prior to the
procedure.

Using mammographic guidance, sterile technique, 1% lidocaine and an
[WX] radioactive seed, the left-sided biopsy clip was localized
using a lateral approach. The follow-up mammogram images confirm the
seed in the expected location.

Follow-up survey of the patient confirms presence of the radioactive
seed.

Order number of [WX] seed:  [PHONE_NUMBER].

Total activity:  0.243 millicuries reference Date: [DATE]

Using mammographic guidance, sterile technique, 1% lidocaine and an
[WX] radioactive seed, the medial cylinder shaped clip was
localized using a medial approach. The follow-up mammogram images
confirm the seed in the expected location.

Follow-up survey of the patient confirms presence of the radioactive
seed.

Order number of [WX] seed:  [PHONE_NUMBER].

Total activity:  0.258 millicuries reference Date: [DATE]

Using mammographic guidance, sterile technique, 1% lidocaine and an
[WX] radioactive seed, the lateral coil shaped clip was localized
using a lateral approach. The follow-up mammogram images confirm the
seed in the expected location.

Follow-up survey of the patient confirms presence of the radioactive
seed.

Order number of [WX] seed:  [PHONE_NUMBER].

Total activity:  0.258 millicurie reference Date: [DATE]

The patient tolerated the procedure well and was released from the
[REDACTED]. She was given instructions regarding seed removal.
IMPRESSION: Radioactive seed localizations in both breasts. No apparent
complications.

## 2020-02-04 ENCOUNTER — Encounter (HOSPITAL_COMMUNITY): Payer: Self-pay | Admitting: Surgery

## 2020-02-04 ENCOUNTER — Other Ambulatory Visit: Payer: Self-pay

## 2020-02-04 ENCOUNTER — Ambulatory Visit (HOSPITAL_COMMUNITY)
Admission: RE | Admit: 2020-02-04 | Discharge: 2020-02-04 | Disposition: A | Discharge status: Home / Self Care | Payer: Medicare HMO | Attending: Surgery | Admitting: Surgery

## 2020-02-04 ENCOUNTER — Ambulatory Visit (HOSPITAL_COMMUNITY): Payer: Medicare HMO | Admitting: Vascular Surgery

## 2020-02-04 ENCOUNTER — Ambulatory Visit
Admission: RE | Admit: 2020-02-04 | Discharge: 2020-02-04 | Disposition: A | Discharge status: Home / Self Care | Payer: Medicare HMO | Source: Ambulatory Visit | Attending: Surgery | Admitting: Surgery

## 2020-02-04 ENCOUNTER — Ambulatory Visit (HOSPITAL_COMMUNITY): Payer: Medicare HMO

## 2020-02-04 ENCOUNTER — Ambulatory Visit (HOSPITAL_COMMUNITY)
Admission: RE | Admit: 2020-02-04 | Discharge: 2020-02-04 | Disposition: A | Discharge status: Home / Self Care | Payer: Medicare HMO | Source: Ambulatory Visit | Attending: Surgery | Admitting: Surgery

## 2020-02-04 ENCOUNTER — Encounter (HOSPITAL_COMMUNITY)
Admission: RE | Disposition: A | Discharge status: Home / Self Care | Payer: Self-pay | Source: Home / Self Care | Attending: Surgery

## 2020-02-04 DIAGNOSIS — C50911 Malignant neoplasm of unspecified site of right female breast: Secondary | ICD-10-CM

## 2020-02-04 DIAGNOSIS — C50412 Malignant neoplasm of upper-outer quadrant of left female breast: Secondary | ICD-10-CM | POA: Insufficient documentation

## 2020-02-04 DIAGNOSIS — Z803 Family history of malignant neoplasm of breast: Secondary | ICD-10-CM | POA: Insufficient documentation

## 2020-02-04 DIAGNOSIS — C50411 Malignant neoplasm of upper-outer quadrant of right female breast: Secondary | ICD-10-CM | POA: Insufficient documentation

## 2020-02-04 DIAGNOSIS — Z170 Estrogen receptor positive status [ER+]: Secondary | ICD-10-CM | POA: Insufficient documentation

## 2020-02-04 DIAGNOSIS — G8918 Other acute postprocedural pain: Secondary | ICD-10-CM | POA: Diagnosis not present

## 2020-02-04 DIAGNOSIS — D0512 Intraductal carcinoma in situ of left breast: Secondary | ICD-10-CM

## 2020-02-04 DIAGNOSIS — E039 Hypothyroidism, unspecified: Secondary | ICD-10-CM | POA: Diagnosis not present

## 2020-02-04 DIAGNOSIS — D241 Benign neoplasm of right breast: Secondary | ICD-10-CM | POA: Diagnosis not present

## 2020-02-04 DIAGNOSIS — C50912 Malignant neoplasm of unspecified site of left female breast: Secondary | ICD-10-CM | POA: Diagnosis not present

## 2020-02-04 HISTORY — PX: BREAST LUMPECTOMY WITH RADIOACTIVE SEED AND SENTINEL LYMPH NODE BIOPSY: SHX6550

## 2020-02-04 HISTORY — PX: BREAST LUMPECTOMY: SHX2

## 2020-02-04 IMAGING — MG MM BREAST SURGICAL SPECIMEN
1 series · 1 of 1 positions shown · non-contrast
Comparison: Previous exam(s).

CLINICAL DATA: Specimen mammograms following surgical excision a
right breast carcinoma and a carcinoma in situ lesion with a complex
sclerosing lesion.

EXAM:
SPECIMEN RADIOGRAPH OF THE RIGHT BREAST

[R]
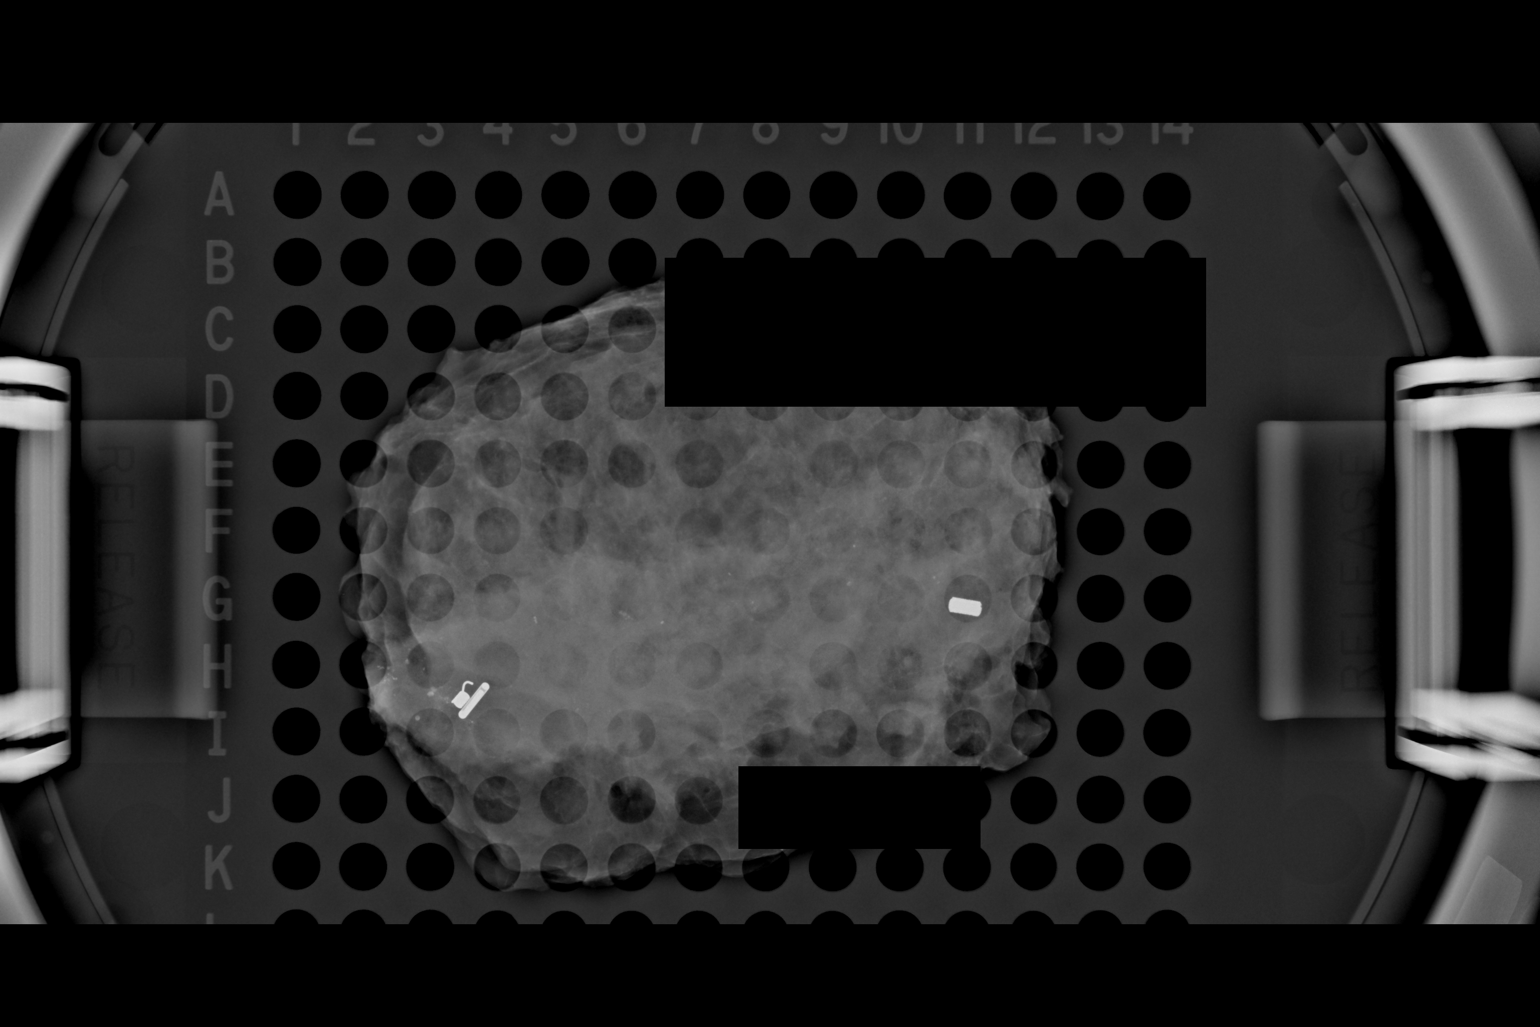

[1 of 1 positions shown; findings below may reference images not displayed]

FINDINGS: Status post excision of the right breast. The radioactive seed lies
adjacent to the coil shaped biopsy clip and several calcifications
on one end of the specimen. The cylinder shaped clip lies on the
opposite end of the specimen and the seed was submitted separately
in a cup. The seeds and clips were marked for pathology.
IMPRESSION: Specimen radiograph of the left breast.

## 2020-02-04 IMAGING — MG MM BREAST SURGICAL SPECIMEN
2 series · 2 of 2 positions shown · non-contrast
Comparison: Previous exam(s).

CLINICAL DATA: Status post LEFT breast lumpectomy today after
earlier radioactive seed localization.

EXAM:
SPECIMEN RADIOGRAPH OF THE LEFT BREAST

[L (1 of 2)]
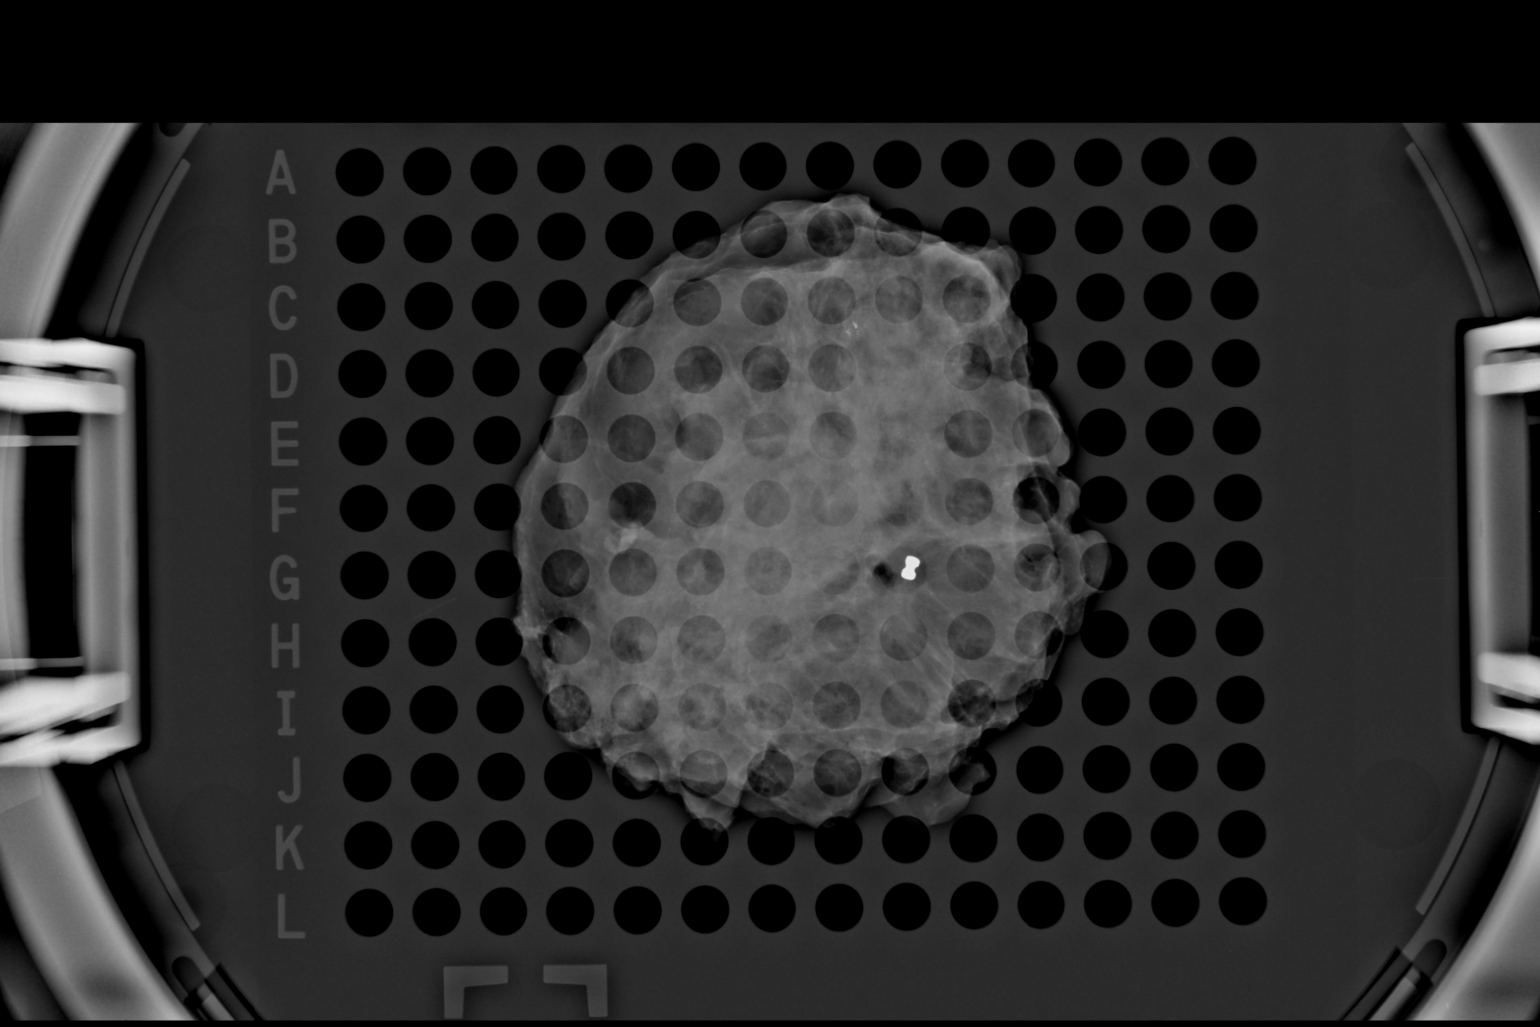

[L (2 of 2)]
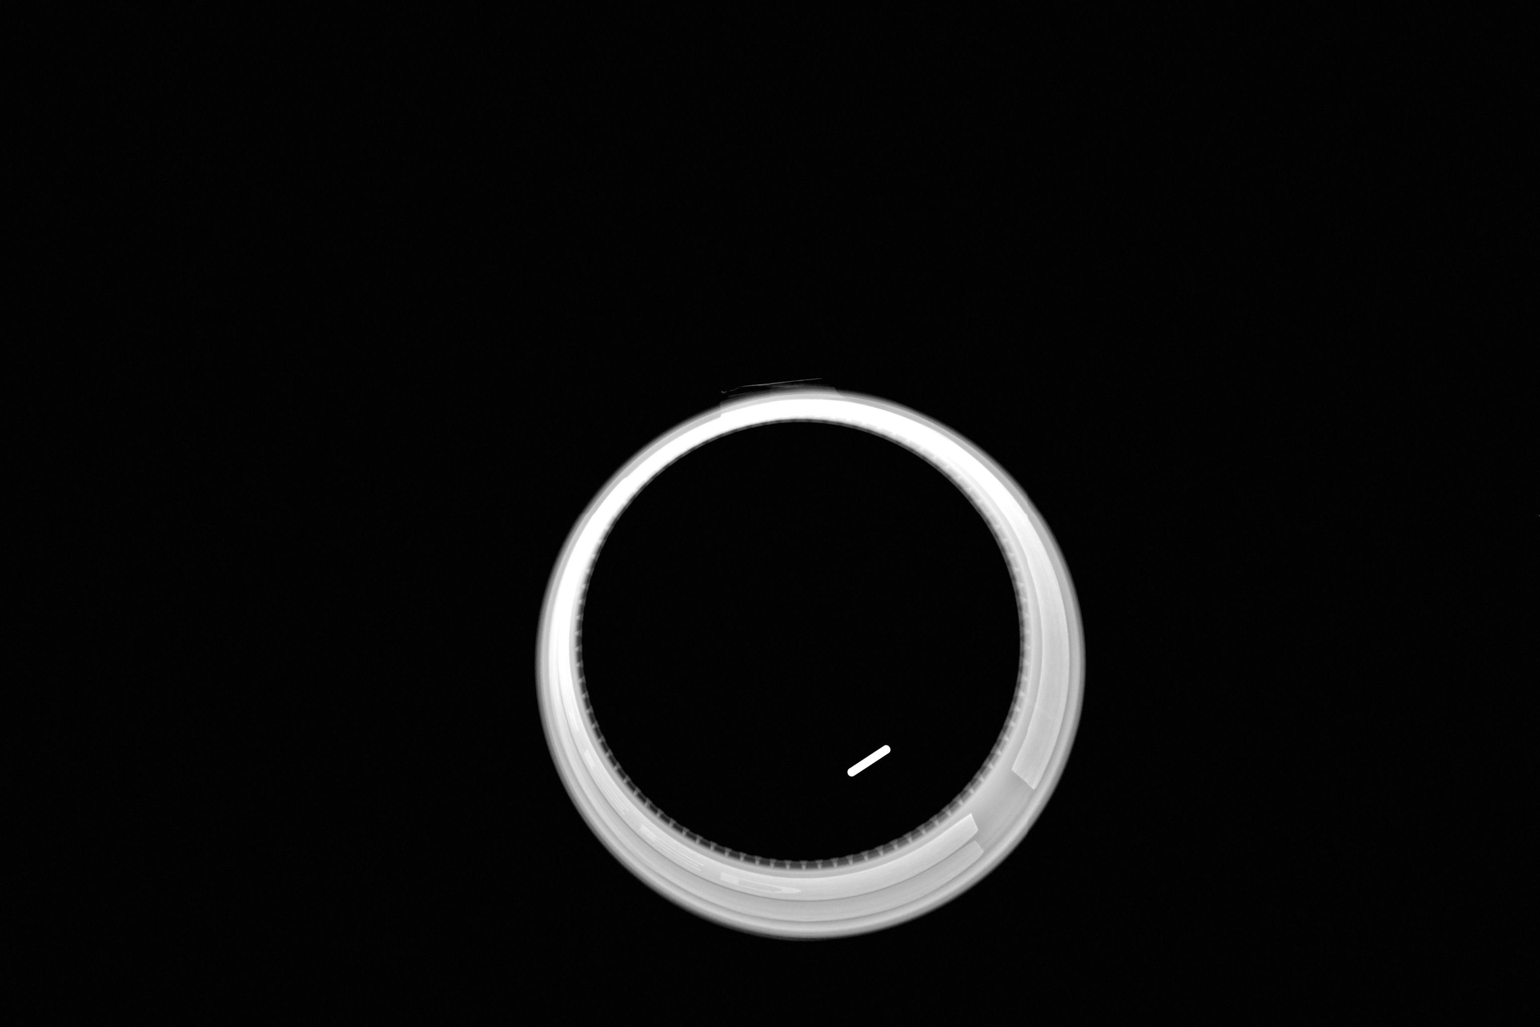

[2 of 2 positions shown; findings below may reference images not displayed]

FINDINGS: Status post excision of the left breast. The radioactive seed and
biopsy marker clip are present, completely intact, and were marked
for pathology.
IMPRESSION: Specimen radiograph of the left breast.

## 2020-02-04 IMAGING — MG MM BREAST SURGICAL SPECIMEN
1 series · 1 of 1 positions shown · non-contrast
Comparison: Previous exam(s).

CLINICAL DATA: Specimen mammograms following surgical excision a
right breast carcinoma and a carcinoma in situ lesion with a complex
sclerosing lesion.

EXAM:
SPECIMEN RADIOGRAPH OF THE RIGHT BREAST

[R]
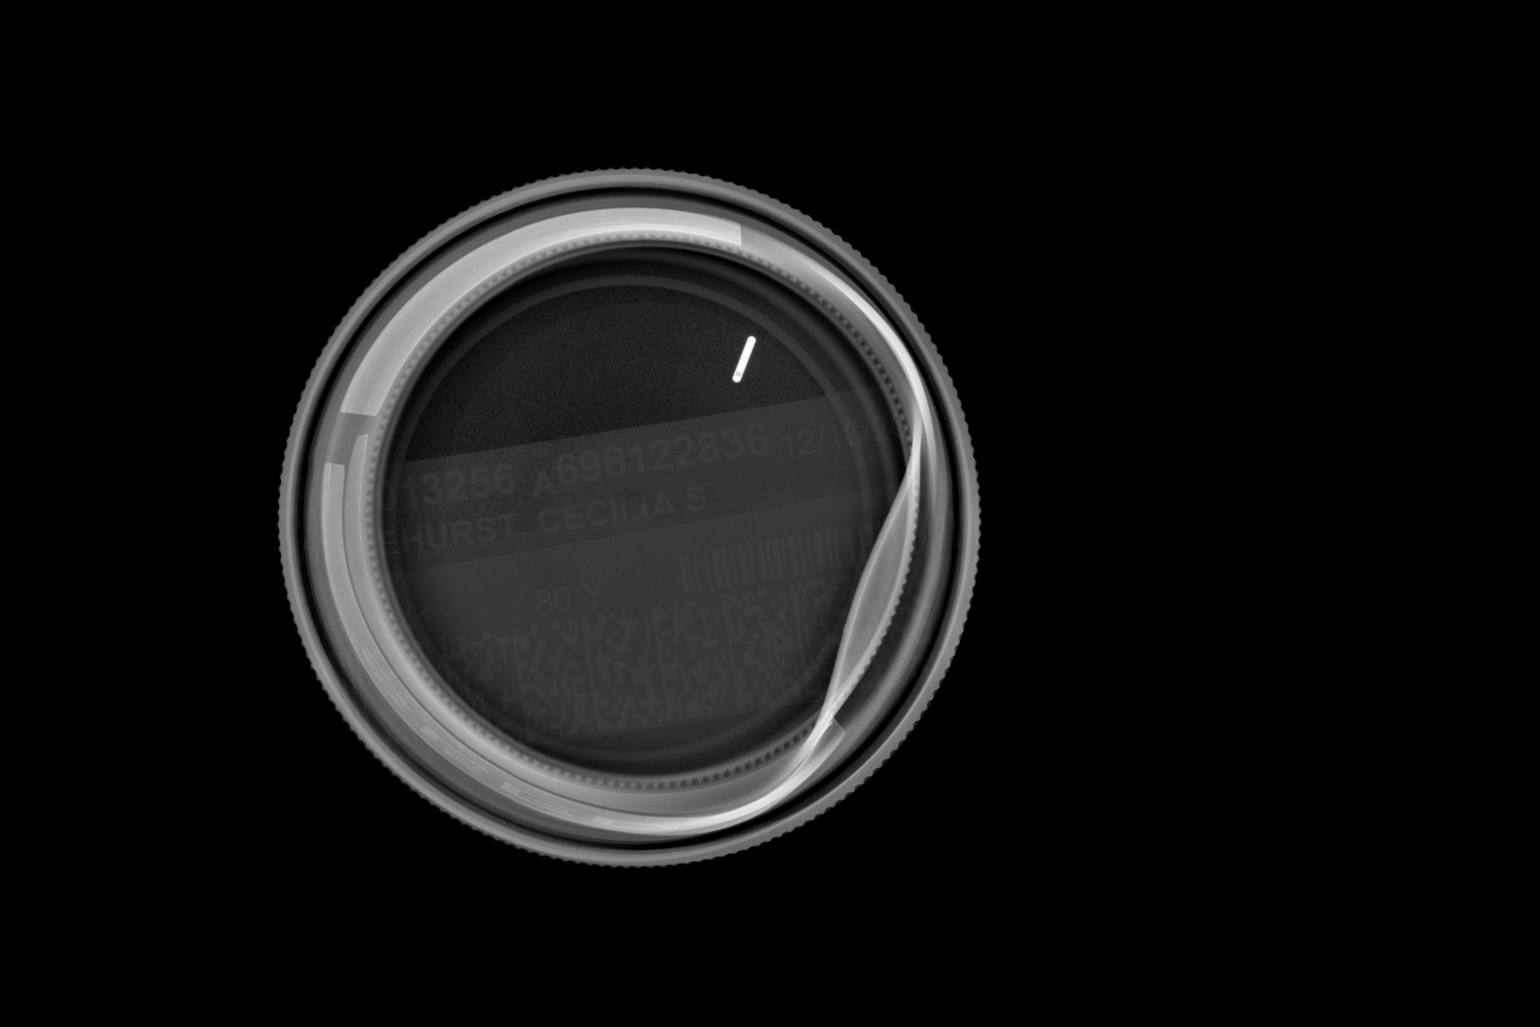

[1 of 1 positions shown; findings below may reference images not displayed]

FINDINGS: Status post excision of the right breast. The radioactive seed lies
adjacent to the coil shaped biopsy clip and several calcifications
on one end of the specimen. The cylinder shaped clip lies on the
opposite end of the specimen and the seed was submitted separately
in a cup. The seeds and clips were marked for pathology.
IMPRESSION: Specimen radiograph of the left breast.

## 2020-02-04 SURGERY — BREAST LUMPECTOMY WITH RADIOACTIVE SEED AND SENTINEL LYMPH NODE BIOPSY
Anesthesia: General | Site: Breast | Laterality: Bilateral

## 2020-02-04 MED ORDER — METHYLENE BLUE 0.5 % INJ SOLN
INTRAVENOUS | Status: AC
  Filled 2020-02-04: qty 10

## 2020-02-04 MED ORDER — ACETAMINOPHEN 500 MG PO TABS
1000.0000 mg | ORAL_TABLET | ORAL | Status: AC
  Administered 2020-02-04: 1000 mg via ORAL
  Filled 2020-02-04: qty 2

## 2020-02-04 MED ORDER — FENTANYL CITRATE (PF) 100 MCG/2ML IJ SOLN
50.0000 ug | Freq: Once | INTRAMUSCULAR | Status: AC
Start: 2020-02-04 — End: 2020-02-04

## 2020-02-04 MED ORDER — LIDOCAINE HCL (CARDIAC) PF 100 MG/5ML IV SOSY
PREFILLED_SYRINGE | INTRAVENOUS | Status: DC | PRN
  Administered 2020-02-04: 60 mg via INTRATRACHEAL

## 2020-02-04 MED ORDER — FENTANYL CITRATE (PF) 100 MCG/2ML IJ SOLN: 25.0000 ug | INTRAMUSCULAR | Status: DC | PRN

## 2020-02-04 MED ORDER — TECHNETIUM TC 99M TILMANOCEPT KIT: 1.0000 | PACK | Freq: Once | INTRAVENOUS | Status: DC | PRN

## 2020-02-04 MED ORDER — PROPOFOL 10 MG/ML IV BOLUS
INTRAVENOUS | Status: AC
  Filled 2020-02-04: qty 20

## 2020-02-04 MED ORDER — PHENYLEPHRINE 40 MCG/ML (10ML) SYRINGE FOR IV PUSH (FOR BLOOD PRESSURE SUPPORT)
PREFILLED_SYRINGE | INTRAVENOUS | Status: DC | PRN
  Administered 2020-02-04: 80 ug via INTRAVENOUS
  Administered 2020-02-04: 120 ug via INTRAVENOUS

## 2020-02-04 MED ORDER — ONDANSETRON HCL 4 MG PO TABS: 4.0000 mg | ORAL_TABLET | Freq: Every day | ORAL | 1 refills | Status: DC | PRN

## 2020-02-04 MED ORDER — CLINDAMYCIN PHOSPHATE 900 MG/50ML IV SOLN
900.0000 mg | INTRAVENOUS | Status: AC
  Administered 2020-02-04: 900 mg via INTRAVENOUS
  Filled 2020-02-04: qty 50

## 2020-02-04 MED ORDER — CLINDAMYCIN PHOSPHATE 900 MG/50ML IV SOLN: 900.0000 mg | INTRAVENOUS | Status: DC

## 2020-02-04 MED ORDER — ONDANSETRON HCL 4 MG/2ML IJ SOLN
4.0000 mg | Freq: Once | INTRAMUSCULAR | Status: AC | PRN
  Administered 2020-02-04: 4 mg via INTRAVENOUS

## 2020-02-04 MED ORDER — ORAL CARE MOUTH RINSE: 15.0000 mL | Freq: Once | OROMUCOSAL | Status: AC

## 2020-02-04 MED ORDER — CHLORHEXIDINE GLUCONATE CLOTH 2 % EX PADS: 6.0000 | MEDICATED_PAD | Freq: Once | CUTANEOUS | Status: DC

## 2020-02-04 MED ORDER — HYDROCODONE-ACETAMINOPHEN 5-325 MG PO TABS: 1.0000 | ORAL_TABLET | Freq: Four times a day (QID) | ORAL | 0 refills | Status: DC | PRN

## 2020-02-04 MED ORDER — BUPIVACAINE HCL 0.25 % IJ SOLN
INTRAMUSCULAR | Status: DC | PRN
  Administered 2020-02-04: 30 mL

## 2020-02-04 MED ORDER — CHLORHEXIDINE GLUCONATE 0.12 % MT SOLN: 15.0000 mL | Freq: Once | OROMUCOSAL | Status: AC

## 2020-02-04 MED ORDER — BUPIVACAINE HCL (PF) 0.25 % IJ SOLN
INTRAMUSCULAR | Status: AC
  Filled 2020-02-04: qty 30

## 2020-02-04 MED ORDER — FENTANYL CITRATE (PF) 100 MCG/2ML IJ SOLN
INTRAMUSCULAR | Status: AC
  Administered 2020-02-04: 50 ug via INTRAVENOUS
  Filled 2020-02-04: qty 2

## 2020-02-04 MED ORDER — SUCCINYLCHOLINE CHLORIDE 20 MG/ML IJ SOLN
INTRAMUSCULAR | Status: DC | PRN
  Administered 2020-02-04: 100 mg via INTRAVENOUS

## 2020-02-04 MED ORDER — GABAPENTIN 300 MG PO CAPS: 300.0000 mg | ORAL_CAPSULE | ORAL | Status: DC

## 2020-02-04 MED ORDER — FENTANYL CITRATE (PF) 250 MCG/5ML IJ SOLN
INTRAMUSCULAR | Status: AC
  Filled 2020-02-04: qty 5

## 2020-02-04 MED ORDER — ACETAMINOPHEN 500 MG PO TABS: 1000.0000 mg | ORAL_TABLET | Freq: Four times a day (QID) | ORAL | 2 refills | Status: AC | PRN

## 2020-02-04 MED ORDER — MIDAZOLAM HCL 2 MG/2ML IJ SOLN
INTRAMUSCULAR | Status: AC
  Filled 2020-02-04: qty 2

## 2020-02-04 MED ORDER — ONDANSETRON HCL 4 MG/2ML IJ SOLN
INTRAMUSCULAR | Status: AC
  Filled 2020-02-04: qty 2

## 2020-02-04 MED ORDER — GABAPENTIN 300 MG PO CAPS
300.0000 mg | ORAL_CAPSULE | ORAL | Status: AC
  Administered 2020-02-04: 300 mg via ORAL
  Filled 2020-02-04: qty 1

## 2020-02-04 MED ORDER — ACETAMINOPHEN 500 MG PO TABS: 1000.0000 mg | ORAL_TABLET | ORAL | Status: DC

## 2020-02-04 MED ORDER — CHLORHEXIDINE GLUCONATE 0.12 % MT SOLN
OROMUCOSAL | Status: AC
  Administered 2020-02-04: 15 mL
  Filled 2020-02-04: qty 15

## 2020-02-04 MED ORDER — LACTATED RINGERS IV SOLN: INTRAVENOUS | Status: DC

## 2020-02-04 MED ORDER — FENTANYL CITRATE (PF) 250 MCG/5ML IJ SOLN
INTRAMUSCULAR | Status: DC | PRN
  Administered 2020-02-04 (×2): 50 ug via INTRAVENOUS
  Administered 2020-02-04: 25 ug via INTRAVENOUS

## 2020-02-04 MED ORDER — PROPOFOL 10 MG/ML IV BOLUS
INTRAVENOUS | Status: DC | PRN
  Administered 2020-02-04: 130 mg via INTRAVENOUS

## 2020-02-04 MED ORDER — BUPIVACAINE-EPINEPHRINE (PF) 0.5% -1:200000 IJ SOLN
INTRAMUSCULAR | Status: DC | PRN
  Administered 2020-02-04: 30 mL via PERINEURAL

## 2020-02-04 MED ORDER — ONDANSETRON HCL 4 MG/2ML IJ SOLN
INTRAMUSCULAR | Status: DC | PRN
  Administered 2020-02-04: 4 mg via INTRAVENOUS

## 2020-02-04 MED ORDER — 0.9 % SODIUM CHLORIDE (POUR BTL) OPTIME
TOPICAL | Status: DC | PRN
  Administered 2020-02-04: 1000 mL

## 2020-02-04 MED ORDER — DEXAMETHASONE SODIUM PHOSPHATE 10 MG/ML IJ SOLN
INTRAMUSCULAR | Status: DC | PRN
  Administered 2020-02-04 (×2): 10 mg

## 2020-02-04 MED ORDER — PHENYLEPHRINE HCL-NACL 10-0.9 MG/250ML-% IV SOLN
INTRAVENOUS | Status: DC | PRN
  Administered 2020-02-04: 50 ug/min via INTRAVENOUS

## 2020-02-04 SURGICAL SUPPLY — 37 items
ADH SKN CLS APL DERMABOND .7 (GAUZE/BANDAGES/DRESSINGS) ×1
APL PRP STRL LF DISP 70% ISPRP (MISCELLANEOUS) ×1
APPLIER CLIP 9.375 MED OPEN (MISCELLANEOUS) ×3
APR CLP MED 9.3 20 MLT OPN (MISCELLANEOUS) ×1
BINDER BREAST LRG (GAUZE/BANDAGES/DRESSINGS) ×2 IMPLANT
BINDER BREAST XLRG (GAUZE/BANDAGES/DRESSINGS) IMPLANT
CANISTER SUCT 3000ML PPV (MISCELLANEOUS) ×3 IMPLANT
CHLORAPREP W/TINT 26 (MISCELLANEOUS) ×3 IMPLANT
CLIP APPLIE 9.375 MED OPEN (MISCELLANEOUS) ×1 IMPLANT
COVER PROBE W GEL 5X96 (DRAPES) ×3 IMPLANT
COVER SURGICAL LIGHT HANDLE (MISCELLANEOUS) ×3 IMPLANT
DERMABOND ADVANCED (GAUZE/BANDAGES/DRESSINGS) ×2
DERMABOND ADVANCED .7 DNX12 (GAUZE/BANDAGES/DRESSINGS) ×1 IMPLANT
DEVICE DUBIN SPECIMEN MAMMOGRA (MISCELLANEOUS) ×5 IMPLANT
DRAPE CHEST BREAST 15X10 FENES (DRAPES) ×3 IMPLANT
ELECT CAUTERY BLADE 6.4 (BLADE) ×3 IMPLANT
ELECT REM PT RETURN 9FT ADLT (ELECTROSURGICAL) ×3
ELECTRODE REM PT RTRN 9FT ADLT (ELECTROSURGICAL) ×1 IMPLANT
GAUZE SPONGE 4X4 12PLY STRL (GAUZE/BANDAGES/DRESSINGS) ×2 IMPLANT
GLOVE BIO SURGEON STRL SZ8 (GLOVE) ×3 IMPLANT
GLOVE BIOGEL PI IND STRL 8 (GLOVE) ×1 IMPLANT
GLOVE BIOGEL PI INDICATOR 8 (GLOVE) ×2
GOWN STRL REUS W/ TWL LRG LVL3 (GOWN DISPOSABLE) ×1 IMPLANT
GOWN STRL REUS W/ TWL XL LVL3 (GOWN DISPOSABLE) ×1 IMPLANT
GOWN STRL REUS W/TWL LRG LVL3 (GOWN DISPOSABLE) ×3
GOWN STRL REUS W/TWL XL LVL3 (GOWN DISPOSABLE) ×3
KIT BASIN OR (CUSTOM PROCEDURE TRAY) ×3 IMPLANT
KIT MARKER MARGIN INK (KITS) ×3 IMPLANT
NDL HYPO 25GX1X1/2 BEV (NEEDLE) ×1 IMPLANT
NEEDLE HYPO 25GX1X1/2 BEV (NEEDLE) ×3 IMPLANT
NS IRRIG 1000ML POUR BTL (IV SOLUTION) ×3 IMPLANT
PACK GENERAL/GYN (CUSTOM PROCEDURE TRAY) ×3 IMPLANT
SUT MNCRL AB 4-0 PS2 18 (SUTURE) ×3 IMPLANT
SUT VIC AB 3-0 SH 18 (SUTURE) ×7 IMPLANT
SYR CONTROL 10ML LL (SYRINGE) ×3 IMPLANT
TOWEL GREEN STERILE (TOWEL DISPOSABLE) ×3 IMPLANT
TOWEL GREEN STERILE FF (TOWEL DISPOSABLE) ×3 IMPLANT

## 2020-02-04 NOTE — Interval H&P Note (Signed)
History and Physical Interval Note:  02/04/2020 2:46 PM  [name]  has presented today for surgery, with the diagnosis of RIGHT BREAST CANCER.  The various methods of treatment have been discussed with the patient and family. After consideration of risks, benefits and other options for treatment, the patient has consented to  Procedure(s): BILATERAL BREAST LUMPECTOMY WITH RADIOACTIVE SEED , RIGHT X 2, LEFT X 1 AND RIGHT SENTINEL LYMPH NODE MAPPING (Bilateral) as a surgical intervention.  The patient's history has been reviewed, patient examined, no change in status, stable for surgery.  I have reviewed the patient's chart and labs.  Questions were answered to the patient's satisfaction.     Turner Daniels MD

## 2020-02-04 NOTE — Progress Notes (Signed)
Patient ate a bowl of cereal with milk at 0900 am, dr Luberta Mutter aware. Patient to be delayed until 3 pm

## 2020-02-04 NOTE — Transfer of Care (Signed)
Immediate Anesthesia Transfer of Care Note  Patient: [name]  Procedure(s) Performed: BILATERAL BREAST LUMPECTOMY WITH RADIOACTIVE SEED , RIGHT X 2, LEFT X 1 AND RIGHT SENTINEL LYMPH NODE MAPPING (Bilateral Breast)  Patient Location: PACU  Anesthesia Type:General  Level of Consciousness: awake, drowsy and patient cooperative  Airway & Oxygen Therapy: Patient Spontanous Breathing and Patient connected to nasal cannula oxygen  Post-op Assessment: Report given to RN and Post -op Vital signs reviewed and stable  Post vital signs: Reviewed and stable  Last Vitals:  Vitals Value Taken Time  BP 189/84 02/04/20 1703  Temp    Pulse 87 02/04/20 1705  Resp 17 02/04/20 1705  SpO2 95 % 02/04/20 1705  Vitals shown include unvalidated device data.  Last Pain:  Vitals:   02/04/20 1332  TempSrc:   PainSc: 0-No pain      Patients Stated Pain Goal: 4 (68/[id number])  Complications: No complications documented.

## 2020-02-04 NOTE — Progress Notes (Signed)
Paged Dr Brantley Stage for consent clarification for procedure today

## 2020-02-04 NOTE — Anesthesia Preprocedure Evaluation (Addendum)
Anesthesia Evaluation  Patient identified by MRN, date of birth, ID band Patient awake    Reviewed: Allergy & Precautions, NPO status , Patient's Chart, lab work & pertinent test results  Airway Mallampati: II  TM Distance: >3 FB Neck ROM: Full    Dental  (+) Teeth Intact   Pulmonary COPD,  COPD inhaler,    Pulmonary exam normal breath sounds clear to auscultation       Cardiovascular hypertension, Normal cardiovascular exam+ dysrhythmias (LBBB)  Rhythm:Regular Rate:Normal     Neuro/Psych PSYCHIATRIC DISORDERS Depression negative neurological ROS     GI/Hepatic negative GI ROS, Neg liver ROS,   Endo/Other  Hypothyroidism   Renal/GU negative Renal ROS     Musculoskeletal negative musculoskeletal ROS (+)   Abdominal   Peds  Hematology negative hematology ROS (+)   Anesthesia Other Findings   Reproductive/Obstetrics                          Anesthesia Physical Anesthesia Plan  ASA: III  Anesthesia Plan: General   Post-op Pain Management:  Regional for Post-op pain   Induction: Intravenous  PONV Risk Score and Plan: 3 and Ondansetron and Dexamethasone  Airway Management Planned: LMA  Additional Equipment:   Intra-op Plan:   Post-operative Plan: Extubation in OR  Informed Consent: I have reviewed the patients History and Physical, chart, labs and discussed the procedure including the risks, benefits and alternatives for the proposed anesthesia with the patient or authorized representative who has indicated his/her understanding and acceptance.       Plan Discussed with: Anesthesiologist and CRNA  Anesthesia Plan Comments: (PAT note written by Myra Gianotti, PA-C, see encounter date 12/02/19. Surgery delayed for additional breast imaging. Rescheduled at [facility], but moved back to [place]. Had updated labs and COVID-19 test.  )      Anesthesia Quick Evaluation

## 2020-02-04 NOTE — Anesthesia Procedure Notes (Signed)
Procedure Name: Intubation Date/Time: 02/04/2020 3:08 PM Performed by: Dorthea Cove, CRNA Pre-anesthesia Checklist: Patient identified, Emergency Drugs available, Suction available and Patient being monitored Patient Re-evaluated:Patient Re-evaluated prior to induction Oxygen Delivery Method: Circle System Utilized Preoxygenation: Pre-oxygenation with 100% oxygen Induction Type: IV induction Ventilation: Mask ventilation without difficulty Laryngoscope Size: Glidescope and 3 Grade View: Grade I Tube type: Oral Tube size: 7.0 mm Number of attempts: 1 Airway Equipment and Method: Stylet,  Oral airway and Video-laryngoscopy Placement Confirmation: ETT inserted through vocal cords under direct vision,  positive ETCO2 and breath sounds checked- equal and bilateral Secured at: 21 cm Tube secured with: Tape Dental Injury: Teeth and Oropharynx as per pre-operative assessment  Difficulty Due To: Difficult Airway- due to reduced neck mobility and Difficult Airway- due to anterior larynx Comments: CRNA failed attempt x1 MAC 3 grade 3 view. MD failed attempt x1 with MAC 3 and bougie. Successful with glidescope 3.

## 2020-02-04 NOTE — Anesthesia Procedure Notes (Signed)
Anesthesia Regional Block: Pectoralis block   Pre-Anesthetic Checklist: ,, timeout performed, Correct Patient, Correct Site, Correct Laterality, Correct Procedure, Correct Position, site marked, Risks and benefits discussed,  Surgical consent,  Pre-op evaluation,  At surgeon's request and post-op pain management  Laterality: Right  Prep: chloraprep       Needles:  Injection technique: Single-shot  Needle Type: Echogenic Needle     Needle Length: 9cm  Needle Gauge: 21     Additional Needles:   Procedures:,,,, ultrasound used (permanent image in chart),,,,  Narrative:  Start time: 02/04/2020 1:57 PM End time: 02/04/2020 2:07 PM Injection made incrementally with aspirations every 5 mL.  Performed by: Personally  Anesthesiologist: Catalina Gravel, MD  Additional Notes: No pain on injection. No increased resistance to injection. Injection made in 5cc increments.  Good needle visualization.  Patient tolerated procedure well.

## 2020-02-04 NOTE — H&P (Signed)
[name]  Location: [facility] Patient #: [id number] DOB: 1940/05/03 Undefined / Language: Suszanne Conners / Race: White Female  History of Present Illness (3 PM) Patient words: Pt presents to the [facility] for evaluation of right breast mass on SDM. She had a sister with breast cancer. 1 .7 cm mass 10 oclock IDC grade 3 ER 15 % PR - HER 2 NEU -KI 40 %. sORE FROM THE BIOPSY BUT NO OTHER COMPLAINTS    ADDITIONAL INFORMATION: 2. PROGNOSTIC INDICATORS Results: IMMUNOHISTOCHEMICAL AND MORPHOMETRIC ANALYSIS PERFORMED MANUALLY The tumor cells are EQUIVOCAL for Her2 (2+). Her2 by FISH will be performed and results reported separately. Estrogen Receptor: 15%, POSITIVE, WEAK STAINING INTENSITY Progesterone Receptor: 0%, NEGATIVE Proliferation Marker Ki67: 40% COMMENT: The negative hormone receptor study(ies) in this case has an internal positive control. REFERENCE RANGE ESTROGEN RECEPTOR NEGATIVE 0% POSITIVE =>1% REFERENCE RANGE PROGESTERONE RECEPTOR NEGATIVE 0% POSITIVE =>1% All controls stained appropriately Thressa Sheller MD Pathologist, Electronic Signature ( Signed 11/16/2019) FINAL DIAGNOSIS Diagnosis 1. Breast, right, needle core biopsy, 9:30 o'clock - FIBROCYSTIC CHANGE. - PSEUDOANGIOMATOUS STROMAL HYPERPLASIA (PASH). 1 of 3 FINAL for FRANCHELLE, FOSKETT S ([id number]) Diagnosis(continued) - NO MALIGNANCY IDENTIFIED. 2. Breast, right, needle core biopsy, 10 o'clock - INVASIVE DUCTAL CARCINOMA, SEE COMMENT. Microscopic Comment 2. The carcinoma appears grade 3 and measures 12 mm in greatest linear extent. Prognostic makers will be ordered. Dr. Jeannie Done has reviewed the case. The case was called to [facility] on 11/13/2019. Vicente Males MD Pathologist, Electronic Signature (Case signed 11/13/2019) Specimen Gross and Clinical Information Specimen Comment 1. TIF: 1:20 pm, CIT: < 30 seconds, mass 2. TIF: 1:25 pm, CIT: < 30 seconds,  mass Specimen(s) Obtained: 1. Breast, right, needle core biopsy, 9:30 o'clock 2. Breast, right, needle core biopsy, 10 o'clock Specimen Clinical.  The patient is a 79 year old female.   Medication History ( Medications Reconciled     Review of Systems ( All other systems negative   Physical Exam   General Mental Status-Alert. General Appearance-Consistent with stated age. Hydration-Well hydrated. Voice-Normal.  Breast Note: BRUISING RIGHT BREAST MASS / HEMATOMA AT 10 OCLOCK LEFT BREAST NORMAL  Cardiovascular Cardiovascular examination reveals -on palpation PMI is normal in location and amplitude, no palpable S3 or S4. Normal cardiac borders., normal heart sounds, regular rate and rhythm with no murmurs, carotid auscultation reveals no bruits and normal pedal pulses bilaterally.  Musculoskeletal Normal Exam - Left-Upper Extremity Strength Normal and Lower Extremity Strength Normal. Normal Exam - Right-Upper Extremity Strength Normal, Lower Extremity Weakness.    Assessment & Plan  BREAST CANCER, RIGHT (C50.911) Impression: PT OPTED FOR RIGHT BREAST LUMPECTOMY X 2 AND sln MAPPING Risk of lumpectomy include bleeding, infection, seroma, more surgery, use of seed/wire, wound care, cosmetic deformity and the need for other treatments, death , blood clots, death. Pt agrees to proceed. Risk of sentinel lymph node mapping include bleeding, infection, lymphedema, shoulder pain. stiffness, dye allergy. cosmetic deformity , blood clots, death, need for more surgery. Pt agrees to proceed.  Current Plans You are being scheduled for surgery- Our schedulers will call you.  You should hear from our office's scheduling department within 5 working days about the location, date, and time of surgery. We try to make accommodations for patient's preferences in scheduling surgery, but sometimes the OR schedule or the surgeon's schedule prevents [place] from  making those accommodations.  If you have not heard from our office [phone] in 5 working days, call the office and ask for  your surgeon's nurse.  If you have other questions about your diagnosis, plan, or surgery, call the office and ask for your surgeon's nurse.  Pt Education - [facility] Breast Cancer Information Given - [facility] "Breast Journey" Package We discussed the staging and pathophysiology of breast cancer. We discussed all of the different options for treatment for breast cancer including surgery, chemotherapy, radiation therapy, Herceptin, and antiestrogen therapy. We discussed a sentinel lymph node biopsy as she does not appear to having lymph node involvement right now. We discussed the performance of that with injection of radioactive tracer and blue dye. We discussed that she would have an incision underneath her axillary hairline. We discussed that there is a bout a 10-20% chance of having a positive node with a sentinel lymph node biopsy and we will await the permanent pathology to make any other first further decisions in terms of her treatment. One of these options might be to return to the operating room to perform an axillary lymph node dissection. We discussed about a 1-2% risk lifetime of chronic shoulder pain as well as lymphedema associated with a sentinel lymph node biopsy. We discussed the options for treatment of the breast cancer which included lumpectomy versus a mastectomy. We discussed the performance of the lumpectomy with a wire placement. We discussed a 10-20% chance of a positive margin requiring reexcision in the operating room. We also discussed that she may need radiation therapy or antiestrogen therapy or both if she undergoes lumpectomy. We discussed the mastectomy and the postoperative care for that as well. We discussed that there is no difference in her survival whether she undergoes lumpectomy with radiation therapy or antiestrogen therapy versus a  mastectomy. There is a slight difference in the local recurrence rate being 3-5% with lumpectomy and about 1% with a mastectomy. We discussed the risks of operation including bleeding, infection, possible reoperation. She understands her further therapy will be based on what her stages at the time of her operation.  Pt Education - [facility] breast cancer surgery: discussed with patient and provided information. Pt Education - ABC (After Breast Cancer) Class Info: discussed with patient and provided information.                          Pt had a left sided focus of DCIS  Want breast conserving surgery  Significant area but not unreasonable to try Understands re excision risk and need for mastectomy if needed

## 2020-02-04 NOTE — Op Note (Signed)
Preoperative diagnosis: Stage I right breast cancer upper outer quadrant and left breast upper outer quadrant DCIS high-grade  Postoperative diagnosis: Same  Procedure: Right breast seed localized lumpectomy using 2 seeds to bracket the area and right axillary sentinel lymph node mapping and left seed localized lumpectomy  Surgeon: Erroll Luna, MD  Anesthesia: General with local anesthetic  Drains: None  Specimen: Right breast tissue with seed and clip to pathology with additional cavity shave biopsy sent and right axillary sentinel node x1 with left breast tissue with localizing clip and seed all specimens were verified by Faxitron  EBL: 40 cc  IV fluids: Per anesthesia record  Indications for procedure: The patient is an 79 year old female found to have right breast cancer stage I a left breast DCIS.  She had very dense small breast.  We discussed different management strategies for her given her advanced age but her overall health was pretty good and she went to proceed with surgery.  We discussed bilateral mastectomies due to her small breast size and fibrous tissue but she did want an attempt at breast conserving surgery first.  She realized her margins were positive she had 2 additional surgery.  We discussed at great length.  She understood that risk but was very motivated to keep both of her breasts and this was felt to be reasonable after examination reviewing her films.The procedure has been discussed with the patient. Alternatives to surgery have been discussed with the patient.  Risks of surgery include bleeding,  Infection, positive margin, wound complication, lymphedema seroma formation, death,  and the need for further surgery.   The patient understands and wishes to proceed.   Description of procedure: The patient was met in the holding area and questions were answered.  She underwent injection with technetium sulfur colloid in the right for mapping.  Neoprobe used to identify  seeds on both sides since there were 2 seeds on the right and one on the left.  These films were reviewed.  She was taken back to the operating.  She is placed supine upon the OR table.  After induction of general anesthesia, both breasts and upper arms were prepped and draped in sterile fashion timeout performed.  Proper patient, site and procedure were verified.  The right side was then first.  Neoprobe was used to identify both seeds.  Curvilinear incision made above the nipple areolar complex and all tissue in this area excised with the help of the neoprobe.  Of note 1 seed became extruded due to the very dense nature of her tissue and she was quite friable due to previous hematoma.  Once I removed the specimen the Faxitron image revealed the initial medial clip to be present.  The medial seed was removed separately.  The lateral clip and seed were identified.  I went ahead and excised all margins.  Hemostasis was achieved.  These were oriented all with ink and sent separately.  Local anesthetic infiltrated.  After ensuring hemostasis the wound was closed with 3-0 Vicryl and 4 Monocryl.  Neoprobe setting switched technetium.  Hotspot identified in the right axilla.  A 3 cm incision was made dissection was carried down into the level 1 contents.  1 hot node was identified and removed.  Background counts approached 0.  There were no other abnormal nodes.  Hemostasis achieved.  Wound closed with 3-0 Vicryl and 4-0 Monocryl.  Left breast lumpectomy done in a similar fashion.  Neoprobe was used to identify the seed.  Curvilinear incision  was made at the superior border of the nipple areolar complex.  All tissue around the seed were removed but during the process the seed became extruded due to the dense nature of her tissue was sent separately.  Once tissue was removed the Faxitron image revealed the seed to be present.  Wound was irrigated.  Hemostasis achieved.  Wound closed with 3-0 Vicryl for Monocryl.   Dermabond applied to both incisions.  Breast binder placed.  All counts were found to be correct.  The patient was awoke extubated taken to recovery in satisfactory condition.

## 2020-02-04 NOTE — Discharge Instructions (Signed)
[facility] °Office Phone Number [phone] ° °BREAST BIOPSY/ PARTIAL MASTECTOMY: POST OP INSTRUCTIONS ° °Always review your discharge instruction sheet given to you by the facility where your surgery was performed. ° °IF YOU HAVE DISABILITY OR FAMILY LEAVE FORMS, YOU MUST BRING THEM TO THE OFFICE FOR PROCESSING.  DO NOT GIVE THEM TO YOUR DOCTOR. ° °1. A prescription for pain medication may be given to you upon discharge.  Take your pain medication as prescribed, if needed.  If narcotic pain medicine is not needed, then you may take acetaminophen (Tylenol) or ibuprofen (Advil) as needed. °2. Take your usually prescribed medications unless otherwise directed °3. If you need a refill on your pain medication, please contact your pharmacy.  They will contact our office to request authorization.  Prescriptions will not be filled after 5pm or on week-ends. °4. You should eat very light the first 24 hours after surgery, such as soup, crackers, pudding, etc.  Resume your normal diet the day after surgery. °5. Most patients will experience some swelling and bruising in the breast.  Ice packs and a good support bra will help.  Swelling and bruising can take several days to resolve.  °6. It is common to experience some constipation if taking pain medication after surgery.  Increasing fluid intake and taking a stool softener will usually help or prevent this problem from occurring.  A mild laxative (Milk of Magnesia or Miralax) should be taken according to package directions if there are no bowel movements after 48 hours. °7. Unless discharge instructions indicate otherwise, you may remove your bandages 24-48 hours after surgery, and you may shower at that time.  You may have steri-strips (small skin tapes) in place directly over the incision.  These strips should be left on the skin for 7-10 days.  If your surgeon used skin glue on the incision, you may shower in 24 hours.  The glue will flake off over the  next 2-3 weeks.  Any sutures or staples will be removed at the office during your follow-up visit. °8. ACTIVITIES:  You may resume regular daily activities (gradually increasing) beginning the next day.  Wearing a good support bra or sports bra minimizes pain and swelling.  You may have sexual intercourse when it is comfortable. °a. You may drive when you no longer are taking prescription pain medication, you can comfortably wear a seatbelt, and you can safely maneuver your car and apply brakes. °b. RETURN TO WORK:  ______________________________________________________________________________________ °9. You should see your doctor in the office for a follow-up appointment approximately two weeks after your surgery.  Your doctor’s nurse will typically make your follow-up appointment when she calls you with your pathology report.  Expect your pathology report 2-3 business days after your surgery.  You may call to check if you do not hear from us after three days. °10. OTHER INSTRUCTIONS: _______________________________________________________________________________________________ _____________________________________________________________________________________________________________________________________ °_____________________________________________________________________________________________________________________________________ °_____________________________________________________________________________________________________________________________________ ° °WHEN TO CALL YOUR DOCTOR: °1. Fever over 101.0 °2. Nausea and/or vomiting. °3. Extreme swelling or bruising. °4. Continued bleeding from incision. °5. Increased pain, redness, or drainage from the incision. ° °The clinic staff is available to answer your questions during regular business hours.  Please don’t hesitate to call and ask to speak to one of the nurses for clinical concerns.  If you have a medical emergency, go to the nearest  emergency room or call 911.  A surgeon from [facility] is always on call at the hospital. ° °For further questions, please visit [email or web address]  °

## 2020-02-04 NOTE — Anesthesia Postprocedure Evaluation (Signed)
Anesthesia Post Note  Patient: [name]  Procedure(s) Performed: BILATERAL BREAST LUMPECTOMY WITH RADIOACTIVE SEED , RIGHT X 2, LEFT X 1 AND RIGHT SENTINEL LYMPH NODE MAPPING (Bilateral Breast)     Patient location during evaluation: PACU Anesthesia Type: General Level of consciousness: awake and alert and awake Pain management: pain level controlled Vital Signs Assessment: post-procedure vital signs reviewed and stable Respiratory status: spontaneous breathing, nonlabored ventilation, respiratory function stable and patient connected to nasal cannula oxygen Cardiovascular status: blood pressure returned to baseline and stable Postop Assessment: no apparent nausea or vomiting Anesthetic complications: no   No complications documented.  Last Vitals:  Vitals:   02/04/20 1733 02/04/20 1735  BP: (!) 190/92 (!) 175/82  Pulse: 88 85  Resp: 18 14  Temp:    SpO2: 92% 90%    Last Pain:  Vitals:   02/04/20 1733  TempSrc:   PainSc: 0-No pain                 Catalina Gravel

## 2020-02-05 ENCOUNTER — Encounter (HOSPITAL_COMMUNITY): Payer: Self-pay | Admitting: Surgery

## 2020-02-09 ENCOUNTER — Encounter: Payer: Self-pay | Admitting: *Deleted

## 2020-02-11 ENCOUNTER — Encounter: Payer: Self-pay | Admitting: *Deleted

## 2020-02-15 ENCOUNTER — Ambulatory Visit: Payer: Self-pay | Admitting: Surgery

## 2020-02-15 DIAGNOSIS — C50421 Malignant neoplasm of upper-outer quadrant of right male breast: Secondary | ICD-10-CM

## 2020-02-16 LAB — SURGICAL PATHOLOGY

## 2020-02-17 ENCOUNTER — Encounter: Payer: Self-pay | Admitting: *Deleted

## 2020-02-21 NOTE — Progress Notes (Signed)
Patient Care Team: Jonathon Jordan, MD as PCP - General (Family Medicine) Rockwell Germany, RN as Oncology Nurse Navigator Tressie Ellis, Paulette Blanch, RN as Oncology Nurse Navigator [place], [name], MD as Consulting Physician (General Surgery) Nicholas Lose, MD as Consulting Physician (Hematology and Oncology) Kyung Rudd, MD as Consulting Physician (Radiation Oncology)  DIAGNOSIS:    ICD-10-CM   1. Malignant neoplasm of upper-outer quadrant of right breast in female, estrogen receptor positive ([place])  C50.411    Z17.0     SUMMARY OF ONCOLOGIC HISTORY: Oncology History  Malignant neoplasm of upper-outer quadrant of right breast in female, estrogen receptor positive ([place])  11/12/2019 Initial Diagnosis   Screening mammogram detected a 1.7cm mass at the 10 o'clock position with calcifications, a 0.9cm mass at the 9:30 position, and no right axillary adenopathy. Biopsy showed no malignancy at the 9:30 position, and at the 10 o'clock position, IDC, grade 3, HER-2 equivocal (2+), ER+ 15% weak, PR- 0%, Ki67 40%.    02/04/2020 Surgery   Bilateral lumpectomies ([place]):  Right breast: IDC, grade 3, 2.2cm, high grade DCIS, involved margins, one right axillary lymph node negative for carcinoma Left breast: high grade DCIS, 1.7cm, involved margins.      CHIEF COMPLIANT: Follow-up of bilateral breast cancers s/p lumpectomies  INTERVAL HISTORY: [name] is a 80 y.o. with above-mentioned history of right breast cancer and left breast DCUS. She underwent bilateral lumpectomies with Dr. Brantley Stage on 02/04/20 for which pathology showed in the right breast, invasive ductal carcinoma, grade 3, 2.2cm, high grade DCIS, involved margins, one right axillary lymph node negative for carcinoma, and in the left breast, high grade DCIS, 1.7cm, involved margins. She presents to the clinic today to discuss the pathology report and further treatment.  Patient is complaining of severe emotional distress because she  has become very forgetful since her surgery.  ALLERGIES:  is allergic to augmentin [amoxicillin-pot clavulanate], black cohosh, codeine, hyoscyamine, oxybutynin chloride, and minocycline.  MEDICATIONS:  Current Outpatient Medications  Medication Sig Dispense Refill  . acetaminophen (TYLENOL) 500 MG tablet Take 2 tablets (1,000 mg total) by mouth every 6 (six) hours as needed. 40 tablet 2  . albuterol (VENTOLIN HFA) 108 (90 Base) MCG/ACT inhaler Inhale 2 puffs into the lungs every 6 (six) hours as needed for wheezing or shortness of breath. 8 g 12  . azelastine (ASTELIN) 0.1 % nasal spray 1-2 puffs each nostril twice daily if needed (Patient taking differently: Place 1-2 sprays into both nostrils daily as needed for rhinitis.) 30 mL 12  . benzonatate (TESSALON) 100 MG capsule Take 100 mg by mouth 3 (three) times daily as needed for cough.     Marland Kitchen BETA CAROTENE PO Take 7,500 mcg by mouth daily.    . Cholecalciferol (VITAMIN D) 50 MCG (2000 UT) tablet Take 2,000 Units by mouth daily.    . citalopram (CELEXA) 20 MG tablet Take 20 mg by mouth daily.    . Coenzyme Q10 (COQ-10) 100 MG CAPS Take 100 mg by mouth daily.    . Cyanocobalamin (B-12 PO) Take 1,000 mg by mouth daily.    . dorzolamide (TRUSOPT) 2 % ophthalmic solution Place 1 drop into both eyes 2 (two) times daily.     . ferrous sulfate 325 (65 FE) MG tablet Take 325 mg by mouth daily with breakfast.    . fluticasone (FLONASE) 50 MCG/ACT nasal spray Place 2 sprays into both nostrils daily. (Patient taking differently: Place 2 sprays into both nostrils daily as needed for  allergies.) 15 g 2  . Fluticasone-Umeclidin-Vilant (TRELEGY ELLIPTA) 100-62.5-25 MCG/INH AEPB Inhale 1 puff into the lungs daily. (Patient not taking: Reported on 02/02/2020) 14 each 0  . FLUZONE HIGH-DOSE QUADRIVALENT 0.7 ML SUSY PHARMACIST ADMINISTERED IMMUNIZATION ADMINISTERED AT TIME OF DISPENSING    . folic acid (FOLVITE) 683 MCG tablet Take 400 mcg by mouth daily.    Marland Kitchen  HYDROcodone-acetaminophen (NORCO/VICODIN) 5-325 MG tablet Take 1 tablet by mouth every 6 (six) hours as needed for moderate pain. 15 tablet 0  . Krill Oil 350 MG CAPS Take 350 mg by mouth daily.    Marland Kitchen latanoprost (XALATAN) 0.005 % ophthalmic solution Place 1 drop into both eyes at bedtime.    Marland Kitchen levothyroxine (SYNTHROID, LEVOTHROID) 88 MCG tablet Take 88 mcg by mouth daily before breakfast.    . Multiple Minerals (CALCIUM/MAGNESIUM/ZINC) TABS Take 3 tablets by mouth at bedtime. 1831m     . Multiple Vitamin (MULTIVITAMIN) tablet Take 1 tablet by mouth daily.    . Naproxen Sodium 220 MG CAPS Take 440 mg by mouth daily as needed (pain).    . NONFORMULARY OR COMPOUNDED ITEM [facility] - Antifungal topical - Terbinafine 3%, Fluconazole 2%, Tea Tree Oil 5%, Urea 10%, Ibuprofen 2%, + Itraconazole 3%, in #332mDMSO suspension. Apply to affected toenail(s) once at bedtime or twice daily. (Patient taking differently: Apply 1 application topically daily as needed (Toe fungus). [facility] Antifungal topical - Terbinafine 3%, Fluconazole 2%, Tea Tree Oil 5%, Urea 10%, Ibuprofen 2%, + Itraconazole 3%, in #3028mMSO suspension. Apply to affected toenail(s) once at bedtime or twice daily.) 30 each 11  . ondansetron (ZOFRAN) 4 MG tablet Take 1 tablet (4 mg total) by mouth daily as needed for nausea or vomiting. 30 tablet 1  . rosuvastatin (CRESTOR) 5 MG tablet Take 5 mg by mouth at bedtime.    . vitamin C (ASCORBIC ACID) 500 MG tablet Take 500 mg by mouth daily.    . vitamin E 400 UNIT capsule Take 400 Units by mouth daily.     Current Facility-Administered Medications  Medication Dose Route Frequency Provider Last Rate Last Admin  . 0.9 %  sodium chloride infusion  500 mL Intravenous Continuous Danis, HenEstill CottaI, MD        PHYSICAL EXAMINATION: ECOG PERFORMANCE STATUS: 1 - Symptomatic but completely ambulatory  There were no vitals filed for this visit. There were no vitals filed for this  visit.  LABORATORY DATA:  I have reviewed the data as listed CMP Latest Ref Rng & Units 02/02/2020 12/02/2019 11/18/2019  Glucose 70 - 99 mg/dL 100(H) 77 99  BUN 8 - 23 mg/dL '12 12 14  ' Creatinine 0.44 - 1.00 mg/dL 0.87 0.84 0.87  Sodium 135 - 145 mmol/L 140 139 139  Potassium 3.5 - 5.1 mmol/L 4.0 3.7 3.7  Chloride 98 - 111 mmol/L 104 103 104  CO2 22 - 32 mmol/L '26 29 29  ' Calcium 8.9 - 10.3 mg/dL 9.8 9.6 9.7  Total Protein 6.5 - 8.1 g/dL 7.8 7.6 7.8  Total Bilirubin 0.3 - 1.2 mg/dL 0.7 0.6 0.4  Alkaline Phos 38 - 126 U/L 83 80 99  AST 15 - 41 U/L '30 25 25  ' ALT 0 - 44 U/L '22 17 18    ' Lab Results  Component Value Date   WBC 6.0 02/02/2020   HGB 14.7 02/02/2020   HCT 44.8 02/02/2020   MCV 98.2 02/02/2020   PLT 241 02/02/2020   NEUTROABS 3.2 02/02/2020  ASSESSMENT & PLAN:  Malignant neoplasm of upper-outer quadrant of right breast in female, estrogen receptor positive ([place]) 02/04/2020: ilateral lumpectomies ([place]):  Right breast: IDC, grade 3, 2.2cm, high grade DCIS, involved margins (posterior and lateral), one right axillary lymph node negative for carcinoma Left breast: high grade DCIS, 1.7cm, involved margins (inferior).  HER-2 Negative by FISH, ER+ 15% weak, PR- 0%, Ki67 40%.  Pathology counseling: I discussed the final pathology report of the patient provided  a copy of this report. I discussed the margins as well as lymph node surgeries. We also discussed the final staging along with previously performed ER/PR and HER-2/neu testing.  Treatment Plan: Resection of the positive margins on both breasts. 1. Adjuvant radiation therapy 2. Followed by antiestrogen therapy.  Emotional distress: Since surgery she has become very forgetful and this is causing her severe emotional distress. The fact that she will need another surgery is giving her significant cause for concern. She is worried that her memory will not come back.  We requested a Education officer, museum to counsel her  and help her with her emotional distress. RTC after RT is complete  No orders of the defined types were placed in this encounter.  The patient has a good understanding of the overall plan. she agrees with it. she will call with any problems that may develop before the next visit here.  Total time spent: 20 mins including face to face time and time spent for planning, charting and coordination of care  Nicholas Lose, MD 02/22/2020  I, Cloyde Reams Dorshimer, am acting as scribe for Dr. Nicholas Lose.  I have reviewed the above documentation for accuracy and completeness, and I agree with the above.

## 2020-02-22 ENCOUNTER — Other Ambulatory Visit: Payer: Self-pay

## 2020-02-22 ENCOUNTER — Encounter: Payer: Self-pay | Admitting: *Deleted

## 2020-02-22 ENCOUNTER — Telehealth: Payer: Self-pay | Admitting: Radiation Oncology

## 2020-02-22 ENCOUNTER — Inpatient Hospital Stay: Payer: Medicare HMO | Attending: Hematology and Oncology | Admitting: Hematology and Oncology

## 2020-02-22 ENCOUNTER — Encounter: Payer: Self-pay | Admitting: General Practice

## 2020-02-22 DIAGNOSIS — Z885 Allergy status to narcotic agent status: Secondary | ICD-10-CM | POA: Diagnosis not present

## 2020-02-22 DIAGNOSIS — Z79899 Other long term (current) drug therapy: Secondary | ICD-10-CM | POA: Insufficient documentation

## 2020-02-22 DIAGNOSIS — C50411 Malignant neoplasm of upper-outer quadrant of right female breast: Secondary | ICD-10-CM

## 2020-02-22 DIAGNOSIS — Z880 Allergy status to penicillin: Secondary | ICD-10-CM | POA: Diagnosis not present

## 2020-02-22 DIAGNOSIS — Z881 Allergy status to other antibiotic agents status: Secondary | ICD-10-CM | POA: Insufficient documentation

## 2020-02-22 DIAGNOSIS — Z170 Estrogen receptor positive status [ER+]: Secondary | ICD-10-CM | POA: Insufficient documentation

## 2020-02-22 NOTE — Telephone Encounter (Signed)
Attempted to change pt's radiation consult and CT SIM appt due to 1/13 surgery. LVM for her to call us back.

## 2020-02-22 NOTE — Progress Notes (Signed)
[facility] Spiritual Care Note  Referred by Dr Lindi Adie and Becky Sax for emotional support regarding distress at diagnosis and reported post-anesthesia forgetfulness. Met with her in exam room after her appointment, bringing a prayer shawl as a tangible symbol of comfort, support, and encouragement. Ms Faeth found it very helpful to talk about her feelings and process her memory issues, which she recalls having had after other surgeries as well. Having surgery just before the Christmas holiday added to the stress and feeling of loss of control/agency. Ms Propp reports good support from her sons, one of whom is local and will provide support after her upcoming surgery, which helps her feel safe and cared for. Provided empathic listening, pastoral reflection, and affirmation of strengths. She left the encounter feeling more optimistic and able to approach another surgery with a sense of agency and conviction that the memory issues will be temporary. She has my direct dial number and encouragement to call whenever needed/desired, and we also plan for me to phone next week for a pastoral check-in.   [place], [place], [facility] Pager [phone] Voicemail [phone]

## 2020-02-22 NOTE — Assessment & Plan Note (Signed)
02/04/2020: ilateral lumpectomies ([place]):  Right breast: IDC, grade 3, 2.2cm, high grade DCIS, involved margins, one right axillary lymph node negative for carcinoma Left breast: high grade DCIS, 1.7cm, involved margins.  HER-2 Negative by FISH, ER+ 15% weak, PR- 0%, Ki67 40%.  Pathology counseling: I discussed the final pathology report of the patient provided  a copy of this report. I discussed the margins as well as lymph node surgeries. We also discussed the final staging along with previously performed ER/PR and HER-2/neu testing.  Treatment Plan: 1. Adjuvant radiation therapy 2. Followed by antiestrogen therapy.  RTC after RT is complete

## 2020-02-23 ENCOUNTER — Telehealth: Payer: Self-pay | Admitting: Hematology and Oncology

## 2020-02-23 ENCOUNTER — Telehealth: Payer: Self-pay | Admitting: Radiation Oncology

## 2020-02-23 NOTE — Telephone Encounter (Signed)
No 1/3 los, no changes made to pt schedule  

## 2020-02-23 NOTE — Telephone Encounter (Signed)
Called pt to r/s her consult and SIM due to her sx date changing, no answer, lvm for return call.

## 2020-02-26 ENCOUNTER — Ambulatory Visit: Payer: Self-pay | Admitting: Surgery

## 2020-02-26 NOTE — H&P (Signed)
[name] Appointment: 02/26/2020 10:50 AM Location: [facility] Patient #: [id number] DOB: [birth year] Undefined / Language: [name] / Race: White Female  History of Present Illness [name] A. Abigail Seawright MD; 02/26/2020 11:18 AM) Patient words: Patient returns after bilateral breast lumpectomies for bilateral breast cancers. Reportedly multiple margins were involved both sites. She presents today for follow-up. She is scheduled for bilateral breast reexcision next week. She does have some mild redness and pain of the right breast. Left breast is doing well.                     3 The patient is a 80 year old female.   Past Surgical History [name], CMA; 02/26/2020 10:54 AM) Appendectomy Breast Biopsy Right. Cataract Surgery Bilateral. Hysterectomy (not due to cancer) - Complete  Diagnostic Studies History [name], CMA; 02/26/2020 10:54 AM) Colonoscopy 1-5 years ago Mammogram within last year Pap Smear >5 years ago  Allergies ([name], CMA; 02/26/2020 10:54 AM) Augmentin *PENICILLINS* Nausea, Vomiting. Black Cohosh Menopause Complex *Miscellaneous Therapeutic Classes** Nausea, Vomiting. Codeine Phosphate *ANALGESICS - OPIOID* Vomiting, Nausea. Hyoscyamine *CHEMICALS* Cramps, made urinary symptoms worse Oxybutynin *URINARY ANTISPASMODICS* Cramps, made urinary symptoms worse Minocycline HCl *TETRACYCLINES* Scratchy tongue, swollen lips Allergies Reconciled  Medication History ([name], CMA; 02/26/2020 10:54 AM) Albuterol (90MCG/ACT Aerosol Soln, Inhalation) Active. Azelastine HCl (0.1% Solution, Nasal) Active. Vitamin D3 (25 MCG(1000 UT) Capsule, Oral) Active. CeleXA (20MG  Tablet, Oral) Active. Coenzyme Q-10 (100MG  Capsule, Oral) Active. Cyanocobalamin (50MCG Tablet, Oral) Active. Trusopt (2% Solution, Ophthalmic) Active. Estrace (0.5MG  Tablet, Oral)  Active. Ferrous Gluconate (246 (27 Fe)MG Tablet, Oral) Active. Flonase (50MCG/DOSE Inhaler, Nasal) Active. Advair Diskus (250-50MCG/DOSE Aero Pow Br Act, Inhalation) Active. Lasix (20MG  Tablet, Oral) Active. Xalatan (0.005% Solution, Ophthalmic) Active. Synthroid (88MCG Tablet, Oral) Active. Loratadine (10MG  Tablet, Oral) Active. Singulair (10MG  Tablet, Oral) Active. Antivert (25MG  Tablet, Oral) Active. Multiple Vitamin (1 (one) Oral) Active. Aleve (220MG  Capsule, Oral) Active. Crestor (5MG  Tablet, Oral) Active. Vitamin C (500MG  Capsule, Oral) Active. Vitamin E (400UNIT Capsule, Oral) Active. Medications Reconciled  Social History [name], CMA; 02/26/2020 10:54 AM) Caffeine use Tea. No alcohol use No drug use Tobacco use Never smoker.  Family History [name], CMA; 02/26/2020 10:54 AM) Arthritis Mother. Breast Cancer Sister. Heart Disease Father, Mother. Hypertension Mother. Melanoma Sister. Respiratory Condition Mother. Seizure disorder Son.  Pregnancy / Birth History [name], CMA; 02/26/2020 10:54 AM) Age at menarche 38 years. Age of menopause 37-60 Gravida 4 Irregular periods Maternal age 47-30 Para 71  Other Problems [name], CMA; 02/26/2020 10:54 AM) Chronic Obstructive Lung Disease Depression Hypercholesterolemia Thyroid Disease     Review of Systems ([name] CMA; 02/26/2020 10:54 AM) General Not Present- Appetite Loss, Chills, Fatigue, Fever, Night Sweats, Weight Gain and Weight Loss. Skin Present- Dryness. Not Present- Change in Wart/Mole, Hives, Jaundice, New Lesions, Non-Healing Wounds, Rash and Ulcer. HEENT Present- Seasonal Allergies. Not Present- Earache, Hearing Loss, Hoarseness, Nose Bleed, Oral Ulcers, Ringing in the Ears, Sinus Pain, Sore Throat, Visual Disturbances, Wears glasses/contact lenses and Yellow Eyes. Respiratory Not Present-  Bloody sputum, Chronic Cough, Difficulty Breathing, Snoring and Wheezing. Breast Not Present- Breast Mass, Breast Pain, Nipple Discharge and Skin Changes. Cardiovascular Not Present- Chest Pain, Difficulty Breathing Lying Down, Leg Cramps, Palpitations, Rapid Heart Rate, Shortness of Breath and Swelling of Extremities. Gastrointestinal Not Present- Abdominal Pain, Bloating, Bloody Stool, Change in Bowel Habits, Chronic diarrhea, Constipation, Difficulty Swallowing, Excessive gas, Gets full quickly at meals, Hemorrhoids, Indigestion, Nausea, Rectal Pain  and Vomiting. Female Genitourinary Not Present- Frequency, Nocturia, Painful Urination, Pelvic Pain and Urgency. Musculoskeletal Not Present- Back Pain, Joint Pain, Joint Stiffness, Muscle Pain, Muscle Weakness and Swelling of Extremities. Neurological Present- Decreased Memory. Not Present- Fainting, Headaches, Numbness, Seizures, Tingling, Tremor, Trouble walking and Weakness. Psychiatric Present- Depression. Not Present- Anxiety, Bipolar, Change in Sleep Pattern, Fearful and Frequent crying. Endocrine Not Present- Cold Intolerance, Excessive Hunger, Hair Changes, Heat Intolerance, Hot flashes and New Diabetes. Hematology Not Present- Blood Thinners, Easy Bruising, Excessive bleeding, Gland problems, HIV and Persistent Infections.  Vitals ([name] CMA; 02/26/2020 10:55 AM) 02/26/2020 10:54 AM Weight: 141.38 lb Height: 67in Body Surface Area: 1.74 m Body Mass Index: 22.14 kg/m  Temp.: 98.66F  Pulse: 90 (Regular)  P.OX: 93% (Room air) BP: 140/76(Sitting, Left Arm, Standard)        Physical Exam ([name] [name]; 02/26/2020 11:19 AM)  Breast Note: Right breast shows mild synovitic change her incision. No large fluid collection or fluctuance. Left breast incision. It is healing well. Both axillary incisions are healing well.    Assessment & Plan ([name]; 02/26/2020 11:19 AM)  POST-OPERATIVE STATE  [phone]) Impression: Mild cellulitis right breast START CLINDAMYCIN 300 MG PO TID SCHEDULED FOR REEXCISION NEXT THURSDAY CALL WITH WORSENSING FEVER CHILLS PAIN OR REDNESS  Current Plans Pt Education - [facility] Free Text Education/Instructions: discussed with patient and provided information. Started Clindamycin HCl 300 MG Oral Capsule, 1 (one) Capsule three times daily, #21, 02/26/2020, No Refill.

## 2020-02-26 NOTE — H&P (View-Only) (Signed)
[name] Appointment: 02/26/2020 10:50 AM Location: [facility] Patient #: [id number] DOB: 10/31/1940 Undefined / Language: Undefined / Race: White Female  History of Present Illness (Abigail Koon A. Keyondre Hepburn MD; 02/26/2020 11:18 AM) Patient words: Patient returns after bilateral breast lumpectomies for bilateral breast cancers. Reportedly multiple margins were involved both sites. She presents today for follow-up. She is scheduled for bilateral breast reexcision next week. She does have some mild redness and pain of the right breast. Left breast is doing well.                     3 The patient is a 79 year old female.   Past Surgical History ([name], CMA; 02/26/2020 10:54 AM) Appendectomy Breast Biopsy Right. Cataract Surgery Bilateral. Hysterectomy (not due to cancer) - Complete  Diagnostic Studies History ([name], CMA; 02/26/2020 10:54 AM) Colonoscopy 1-5 years ago Mammogram within last year Pap Smear >5 years ago  Allergies ([name], CMA; 02/26/2020 10:54 AM) Augmentin *PENICILLINS* Nausea, Vomiting. Black Cohosh Menopause Complex *Miscellaneous Therapeutic Classes** Nausea, Vomiting. Codeine Phosphate *ANALGESICS - OPIOID* Vomiting, Nausea. Hyoscyamine *CHEMICALS* Cramps, made urinary symptoms worse Oxybutynin *URINARY ANTISPASMODICS* Cramps, made urinary symptoms worse Minocycline HCl *TETRACYCLINES* Scratchy tongue, swollen lips Allergies Reconciled  Medication History ([name], CMA; 02/26/2020 10:54 AM) Albuterol (90MCG/ACT Aerosol Soln, Inhalation) Active. Azelastine HCl (0.1% Solution, Nasal) Active. Vitamin D3 (25 MCG(1000 UT) Capsule, Oral) Active. CeleXA (20MG Tablet, Oral) Active. Coenzyme Q-10 (100MG Capsule, Oral) Active. Cyanocobalamin (50MCG Tablet, Oral) Active. Trusopt (2% Solution, Ophthalmic) Active. Estrace (0.5MG Tablet, Oral)  Active. Ferrous Gluconate (246 (27 Fe)MG Tablet, Oral) Active. Flonase (50MCG/DOSE Inhaler, Nasal) Active. Advair Diskus (250-50MCG/DOSE Aero Pow Br Act, Inhalation) Active. Lasix (20MG Tablet, Oral) Active. Xalatan (0.005% Solution, Ophthalmic) Active. Synthroid (88MCG Tablet, Oral) Active. Loratadine (10MG Tablet, Oral) Active. Singulair (10MG Tablet, Oral) Active. Antivert (25MG Tablet, Oral) Active. Multiple Vitamin (1 (one) Oral) Active. Aleve (220MG Capsule, Oral) Active. Crestor (5MG Tablet, Oral) Active. Vitamin C (500MG Capsule, Oral) Active. Vitamin E (400UNIT Capsule, Oral) Active. Medications Reconciled  Social History ([name], CMA; 02/26/2020 10:54 AM) Caffeine use Tea. No alcohol use No drug use Tobacco use Never smoker.  Family History ([name], CMA; 02/26/2020 10:54 AM) Arthritis Mother. Breast Cancer Sister. Heart Disease Father, Mother. Hypertension Mother. Melanoma Sister. Respiratory Condition Mother. Seizure disorder Son.  Pregnancy / Birth History ([name], CMA; 02/26/2020 10:54 AM) Age at menarche 12 years. Age of menopause 56-60 Gravida 4 Irregular periods Maternal age 26-30 Para 4  Other Problems ([name], CMA; 02/26/2020 10:54 AM) Chronic Obstructive Lung Disease Depression Hypercholesterolemia Thyroid Disease     Review of Systems ([name] CMA; 02/26/2020 10:54 AM) General Not Present- Appetite Loss, Chills, Fatigue, Fever, Night Sweats, Weight Gain and Weight Loss. Skin Present- Dryness. Not Present- Change in Wart/Mole, Hives, Jaundice, New Lesions, Non-Healing Wounds, Rash and Ulcer. HEENT Present- Seasonal Allergies. Not Present- Earache, Hearing Loss, Hoarseness, Nose Bleed, Oral Ulcers, Ringing in the Ears, Sinus Pain, Sore Throat, Visual Disturbances, Wears glasses/contact lenses and Yellow Eyes. Respiratory Not Present-  Bloody sputum, Chronic Cough, Difficulty Breathing, Snoring and Wheezing. Breast Not Present- Breast Mass, Breast Pain, Nipple Discharge and Skin Changes. Cardiovascular Not Present- Chest Pain, Difficulty Breathing Lying Down, Leg Cramps, Palpitations, Rapid Heart Rate, Shortness of Breath and Swelling of Extremities. Gastrointestinal Not Present- Abdominal Pain, Bloating, Bloody Stool, Change in Bowel Habits, Chronic diarrhea, Constipation, Difficulty Swallowing, Excessive gas, Gets full quickly at meals, Hemorrhoids, Indigestion, Nausea, Rectal Pain   and Vomiting. Female Genitourinary Not Present- Frequency, Nocturia, Painful Urination, Pelvic Pain and Urgency. Musculoskeletal Not Present- Back Pain, Joint Pain, Joint Stiffness, Muscle Pain, Muscle Weakness and Swelling of Extremities. Neurological Present- Decreased Memory. Not Present- Fainting, Headaches, Numbness, Seizures, Tingling, Tremor, Trouble walking and Weakness. Psychiatric Present- Depression. Not Present- Anxiety, Bipolar, Change in Sleep Pattern, Fearful and Frequent crying. Endocrine Not Present- Cold Intolerance, Excessive Hunger, Hair Changes, Heat Intolerance, Hot flashes and New Diabetes. Hematology Not Present- Blood Thinners, Easy Bruising, Excessive bleeding, Gland problems, HIV and Persistent Infections.  Vitals ([name] CMA; 02/26/2020 10:55 AM) 02/26/2020 10:54 AM Weight: 141.38 lb Height: 67in Body Surface Area: 1.74 m Body Mass Index: 22.14 kg/m  Temp.: 98.66F  Pulse: 90 (Regular)  P.OX: 93% (Room air) BP: 140/76(Sitting, Left Arm, Standard)        Physical Exam (Abigail Eatherly A. Reeves Musick MD; 02/26/2020 11:19 AM)  Breast Note: Right breast shows mild synovitic change her incision. No large fluid collection or fluctuance. Left breast incision. It is healing well. Both axillary incisions are healing well.    Assessment & Plan (Abigail Dayley A. Lelan Cush MD; 02/26/2020 11:19 AM)  POST-OPERATIVE STATE  [phone]) Impression: Mild cellulitis right breast START CLINDAMYCIN 300 MG PO TID SCHEDULED FOR REEXCISION NEXT THURSDAY CALL WITH WORSENSING FEVER CHILLS PAIN OR REDNESS  Current Plans Pt Education - [facility] Free Text Education/Instructions: discussed with patient and provided information. Started Clindamycin HCl 300 MG Oral Capsule, 1 (one) Capsule three times daily, #21, 02/26/2020, No Refill.

## 2020-02-29 ENCOUNTER — Other Ambulatory Visit (HOSPITAL_COMMUNITY)
Admission: RE | Admit: 2020-02-29 | Discharge: 2020-02-29 | Disposition: A | Discharge status: Home / Self Care | Payer: Medicare HMO | Source: Ambulatory Visit | Attending: Surgery | Admitting: Surgery

## 2020-02-29 ENCOUNTER — Other Ambulatory Visit: Payer: Self-pay

## 2020-02-29 ENCOUNTER — Encounter: Payer: Self-pay | Admitting: General Practice

## 2020-02-29 ENCOUNTER — Encounter: Payer: Self-pay | Admitting: Physical Therapy

## 2020-02-29 ENCOUNTER — Encounter (HOSPITAL_BASED_OUTPATIENT_CLINIC_OR_DEPARTMENT_OTHER): Payer: Self-pay | Admitting: Surgery

## 2020-02-29 DIAGNOSIS — Z20822 Contact with and (suspected) exposure to covid-19: Secondary | ICD-10-CM | POA: Diagnosis not present

## 2020-02-29 DIAGNOSIS — Z01812 Encounter for preprocedural laboratory examination: Secondary | ICD-10-CM | POA: Insufficient documentation

## 2020-02-29 LAB — SARS CORONAVIRUS 2 (TAT 6-24 HRS): SARS Coronavirus 2: NEGATIVE

## 2020-02-29 NOTE — Progress Notes (Signed)
Chart reviewed by Dr. Sabra Heck, including history of difficult intubation and memory issues post surgery from Dec 2021: and will proceed with surgery as scheduled at [facility].

## 2020-02-29 NOTE — Progress Notes (Signed)
[facility] Spiritual Care Note  Left voicemail of care and support, encouraging return call.   Chaplain Santos Hardwick, MDiv, [facility] Pager [phone] Voicemail [phone]  

## 2020-03-03 ENCOUNTER — Ambulatory Visit (HOSPITAL_BASED_OUTPATIENT_CLINIC_OR_DEPARTMENT_OTHER)
Admission: RE | Admit: 2020-03-03 | Discharge: 2020-03-03 | Disposition: A | Discharge status: Home / Self Care | Payer: Medicare HMO | Attending: Surgery | Admitting: Surgery

## 2020-03-03 ENCOUNTER — Encounter (HOSPITAL_BASED_OUTPATIENT_CLINIC_OR_DEPARTMENT_OTHER)
Admission: RE | Disposition: A | Discharge status: Home / Self Care | Payer: Self-pay | Source: Home / Self Care | Attending: Surgery

## 2020-03-03 ENCOUNTER — Other Ambulatory Visit: Payer: Self-pay

## 2020-03-03 ENCOUNTER — Ambulatory Visit (HOSPITAL_BASED_OUTPATIENT_CLINIC_OR_DEPARTMENT_OTHER): Payer: Medicare HMO | Admitting: Anesthesiology

## 2020-03-03 DIAGNOSIS — Z8249 Family history of ischemic heart disease and other diseases of the circulatory system: Secondary | ICD-10-CM | POA: Insufficient documentation

## 2020-03-03 DIAGNOSIS — Z170 Estrogen receptor positive status [ER+]: Secondary | ICD-10-CM | POA: Diagnosis not present

## 2020-03-03 DIAGNOSIS — C50911 Malignant neoplasm of unspecified site of right female breast: Secondary | ICD-10-CM | POA: Insufficient documentation

## 2020-03-03 DIAGNOSIS — Z8261 Family history of arthritis: Secondary | ICD-10-CM | POA: Insufficient documentation

## 2020-03-03 DIAGNOSIS — D0512 Intraductal carcinoma in situ of left breast: Secondary | ICD-10-CM | POA: Diagnosis not present

## 2020-03-03 DIAGNOSIS — E039 Hypothyroidism, unspecified: Secondary | ICD-10-CM | POA: Diagnosis not present

## 2020-03-03 DIAGNOSIS — Z808 Family history of malignant neoplasm of other organs or systems: Secondary | ICD-10-CM | POA: Insufficient documentation

## 2020-03-03 DIAGNOSIS — Z836 Family history of other diseases of the respiratory system: Secondary | ICD-10-CM | POA: Insufficient documentation

## 2020-03-03 DIAGNOSIS — Z820 Family history of epilepsy and other diseases of the nervous system: Secondary | ICD-10-CM | POA: Insufficient documentation

## 2020-03-03 DIAGNOSIS — N6012 Diffuse cystic mastopathy of left breast: Secondary | ICD-10-CM | POA: Diagnosis not present

## 2020-03-03 DIAGNOSIS — C50912 Malignant neoplasm of unspecified site of left female breast: Secondary | ICD-10-CM | POA: Insufficient documentation

## 2020-03-03 DIAGNOSIS — D638 Anemia in other chronic diseases classified elsewhere: Secondary | ICD-10-CM | POA: Diagnosis not present

## 2020-03-03 DIAGNOSIS — C50421 Malignant neoplasm of upper-outer quadrant of right male breast: Secondary | ICD-10-CM

## 2020-03-03 DIAGNOSIS — Z888 Allergy status to other drugs, medicaments and biological substances status: Secondary | ICD-10-CM | POA: Insufficient documentation

## 2020-03-03 DIAGNOSIS — C50411 Malignant neoplasm of upper-outer quadrant of right female breast: Secondary | ICD-10-CM | POA: Diagnosis not present

## 2020-03-03 DIAGNOSIS — Z885 Allergy status to narcotic agent status: Secondary | ICD-10-CM | POA: Insufficient documentation

## 2020-03-03 DIAGNOSIS — Z90710 Acquired absence of both cervix and uterus: Secondary | ICD-10-CM | POA: Insufficient documentation

## 2020-03-03 DIAGNOSIS — Z803 Family history of malignant neoplasm of breast: Secondary | ICD-10-CM | POA: Insufficient documentation

## 2020-03-03 DIAGNOSIS — Z79899 Other long term (current) drug therapy: Secondary | ICD-10-CM | POA: Diagnosis not present

## 2020-03-03 HISTORY — PX: RE-EXCISION OF BREAST LUMPECTOMY: SHX6048

## 2020-03-03 SURGERY — EXCISION, LESION, BREAST
Anesthesia: General | Site: Breast | Laterality: Bilateral

## 2020-03-03 MED ORDER — LIDOCAINE 2% (20 MG/ML) 5 ML SYRINGE
INTRAMUSCULAR | Status: AC
  Filled 2020-03-03: qty 5

## 2020-03-03 MED ORDER — OXYCODONE HCL 5 MG PO TABS
ORAL_TABLET | ORAL | Status: AC
  Filled 2020-03-03: qty 1

## 2020-03-03 MED ORDER — OXYCODONE HCL 5 MG PO TABS
5.0000 mg | ORAL_TABLET | Freq: Once | ORAL | Status: AC | PRN
  Administered 2020-03-03: 5 mg via ORAL

## 2020-03-03 MED ORDER — FENTANYL CITRATE (PF) 100 MCG/2ML IJ SOLN
INTRAMUSCULAR | Status: DC | PRN
  Administered 2020-03-03 (×4): 25 ug via INTRAVENOUS

## 2020-03-03 MED ORDER — CLINDAMYCIN PHOSPHATE 600 MG/50ML IV SOLN
INTRAVENOUS | Status: DC | PRN
  Administered 2020-03-03: 600 mg via INTRAVENOUS

## 2020-03-03 MED ORDER — CELECOXIB 200 MG PO CAPS: 200.0000 mg | ORAL_CAPSULE | ORAL | Status: DC

## 2020-03-03 MED ORDER — CLINDAMYCIN HCL 300 MG PO CAPS: 300.0000 mg | ORAL_CAPSULE | Freq: Three times a day (TID) | ORAL | 0 refills | Status: DC

## 2020-03-03 MED ORDER — CLINDAMYCIN PHOSPHATE 900 MG/50ML IV SOLN: 900.0000 mg | INTRAVENOUS | Status: DC

## 2020-03-03 MED ORDER — CHLORHEXIDINE GLUCONATE CLOTH 2 % EX PADS: 6.0000 | MEDICATED_PAD | Freq: Once | CUTANEOUS | Status: DC

## 2020-03-03 MED ORDER — LACTATED RINGERS IV SOLN: INTRAVENOUS | Status: DC

## 2020-03-03 MED ORDER — PROMETHAZINE HCL 25 MG/ML IJ SOLN: 6.2500 mg | INTRAMUSCULAR | Status: DC | PRN

## 2020-03-03 MED ORDER — FENTANYL CITRATE (PF) 100 MCG/2ML IJ SOLN: 25.0000 ug | INTRAMUSCULAR | Status: DC | PRN

## 2020-03-03 MED ORDER — ONDANSETRON HCL 4 MG/2ML IJ SOLN
INTRAMUSCULAR | Status: AC
  Filled 2020-03-03: qty 2

## 2020-03-03 MED ORDER — ACETAMINOPHEN 500 MG PO TABS
1000.0000 mg | ORAL_TABLET | ORAL | Status: AC
  Administered 2020-03-03: 1000 mg via ORAL

## 2020-03-03 MED ORDER — ONDANSETRON HCL 4 MG PO TABS: 4.0000 mg | ORAL_TABLET | Freq: Every day | ORAL | 1 refills | Status: AC | PRN

## 2020-03-03 MED ORDER — PROPOFOL 10 MG/ML IV BOLUS
INTRAVENOUS | Status: AC
  Filled 2020-03-03: qty 20

## 2020-03-03 MED ORDER — EPHEDRINE SULFATE 50 MG/ML IJ SOLN
INTRAMUSCULAR | Status: DC | PRN
  Administered 2020-03-03: 15 mg via INTRAVENOUS
  Administered 2020-03-03: 10 mg via INTRAVENOUS

## 2020-03-03 MED ORDER — CELECOXIB 200 MG PO CAPS
ORAL_CAPSULE | ORAL | Status: AC
  Filled 2020-03-03: qty 1

## 2020-03-03 MED ORDER — ACETAMINOPHEN 500 MG PO TABS
ORAL_TABLET | ORAL | Status: AC
  Filled 2020-03-03: qty 2

## 2020-03-03 MED ORDER — BUPIVACAINE-EPINEPHRINE 0.25% -1:200000 IJ SOLN
INTRAMUSCULAR | Status: DC | PRN
  Administered 2020-03-03: 18 mL

## 2020-03-03 MED ORDER — CLINDAMYCIN PHOSPHATE 900 MG/50ML IV SOLN
INTRAVENOUS | Status: AC
  Filled 2020-03-03: qty 50

## 2020-03-03 MED ORDER — PROPOFOL 10 MG/ML IV BOLUS
INTRAVENOUS | Status: DC | PRN
  Administered 2020-03-03: 100 mg via INTRAVENOUS

## 2020-03-03 MED ORDER — LIDOCAINE HCL (CARDIAC) PF 100 MG/5ML IV SOSY
PREFILLED_SYRINGE | INTRAVENOUS | Status: DC | PRN
  Administered 2020-03-03: 50 mg via INTRAVENOUS

## 2020-03-03 MED ORDER — HYDROCODONE-ACETAMINOPHEN 5-325 MG PO TABS: 1.0000 | ORAL_TABLET | Freq: Four times a day (QID) | ORAL | 0 refills | Status: DC | PRN

## 2020-03-03 MED ORDER — FENTANYL CITRATE (PF) 100 MCG/2ML IJ SOLN
INTRAMUSCULAR | Status: AC
  Filled 2020-03-03: qty 2

## 2020-03-03 MED ORDER — DEXAMETHASONE SODIUM PHOSPHATE 10 MG/ML IJ SOLN
INTRAMUSCULAR | Status: DC | PRN
  Administered 2020-03-03: 5 mg via INTRAVENOUS

## 2020-03-03 MED ORDER — ONDANSETRON HCL 4 MG/2ML IJ SOLN
INTRAMUSCULAR | Status: DC | PRN
  Administered 2020-03-03: 4 mg via INTRAVENOUS

## 2020-03-03 MED ORDER — OXYCODONE HCL 5 MG/5ML PO SOLN: 5.0000 mg | Freq: Once | ORAL | Status: AC | PRN

## 2020-03-03 MED ORDER — GABAPENTIN 300 MG PO CAPS: 300.0000 mg | ORAL_CAPSULE | ORAL | Status: DC

## 2020-03-03 MED ORDER — LACTATED RINGERS IV SOLN: INTRAVENOUS | Status: DC | PRN

## 2020-03-03 SURGICAL SUPPLY — 51 items
ADH SKN CLS APL DERMABOND .7 (GAUZE/BANDAGES/DRESSINGS) ×1
APL PRP STRL LF DISP 70% ISPRP (MISCELLANEOUS) ×1
APPLIER CLIP 9.375 MED OPEN (MISCELLANEOUS)
APR CLP MED 9.3 20 MLT OPN (MISCELLANEOUS)
BINDER BREAST LRG (GAUZE/BANDAGES/DRESSINGS) IMPLANT
BINDER BREAST MEDIUM (GAUZE/BANDAGES/DRESSINGS) IMPLANT
BINDER BREAST XLRG (GAUZE/BANDAGES/DRESSINGS) IMPLANT
BINDER BREAST XXLRG (GAUZE/BANDAGES/DRESSINGS) IMPLANT
BLADE SURG 15 STRL LF DISP TIS (BLADE) ×1 IMPLANT
BLADE SURG 15 STRL SS (BLADE) ×2
CANISTER SUCT 1200ML W/VALVE (MISCELLANEOUS) ×2 IMPLANT
CHLORAPREP W/TINT 26 (MISCELLANEOUS) ×2 IMPLANT
CLIP APPLIE 9.375 MED OPEN (MISCELLANEOUS) IMPLANT
COVER BACK TABLE 60X90IN (DRAPES) ×2 IMPLANT
COVER MAYO STAND STRL (DRAPES) ×2 IMPLANT
COVER WAND RF STERILE (DRAPES) IMPLANT
DECANTER SPIKE VIAL GLASS SM (MISCELLANEOUS) ×2 IMPLANT
DERMABOND ADVANCED (GAUZE/BANDAGES/DRESSINGS) ×1
DERMABOND ADVANCED .7 DNX12 (GAUZE/BANDAGES/DRESSINGS) ×1 IMPLANT
DRAPE LAPAROSCOPIC ABDOMINAL (DRAPES) IMPLANT
DRAPE LAPAROTOMY 100X72 PEDS (DRAPES) ×2 IMPLANT
DRAPE UTILITY XL STRL (DRAPES) ×2 IMPLANT
ELECT COATED BLADE 2.86 ST (ELECTRODE) ×2 IMPLANT
ELECT REM PT RETURN 9FT ADLT (ELECTROSURGICAL) ×2
ELECTRODE REM PT RTRN 9FT ADLT (ELECTROSURGICAL) ×1 IMPLANT
GLOVE ECLIPSE 8.0 STRL XLNG CF (GLOVE) ×2 IMPLANT
GLOVE SRG 8 PF TXTR STRL LF DI (GLOVE) ×1 IMPLANT
GLOVE SURG UNDER POLY LF SZ8 (GLOVE) ×2
GOWN STRL REUS W/ TWL LRG LVL3 (GOWN DISPOSABLE) ×2 IMPLANT
GOWN STRL REUS W/ TWL XL LVL3 (GOWN DISPOSABLE) ×1 IMPLANT
GOWN STRL REUS W/TWL LRG LVL3 (GOWN DISPOSABLE) ×4
GOWN STRL REUS W/TWL XL LVL3 (GOWN DISPOSABLE) ×2
HEMOSTAT ARISTA ABSORB 3G PWDR (HEMOSTASIS) ×1 IMPLANT
KIT MARKER MARGIN INK (KITS) IMPLANT
NDL HYPO 25X1 1.5 SAFETY (NEEDLE) ×1 IMPLANT
NEEDLE HYPO 25X1 1.5 SAFETY (NEEDLE) ×2 IMPLANT
NS IRRIG 1000ML POUR BTL (IV SOLUTION) ×2 IMPLANT
PACK BASIN DAY SURGERY FS (CUSTOM PROCEDURE TRAY) ×2 IMPLANT
PENCIL SMOKE EVACUATOR (MISCELLANEOUS) ×2 IMPLANT
SLEEVE SCD COMPRESS KNEE MED (MISCELLANEOUS) ×2 IMPLANT
SPONGE LAP 4X18 RFD (DISPOSABLE) ×2 IMPLANT
STAPLER VISISTAT 35W (STAPLE) IMPLANT
SUT MNCRL AB 4-0 PS2 18 (SUTURE) ×1 IMPLANT
SUT MON AB 4-0 PC3 18 (SUTURE) ×2 IMPLANT
SUT SILK 2 0 SH (SUTURE) IMPLANT
SUT VICRYL 3-0 CR8 SH (SUTURE) ×3 IMPLANT
SYR CONTROL 10ML LL (SYRINGE) ×2 IMPLANT
TOWEL GREEN STERILE FF (TOWEL DISPOSABLE) ×4 IMPLANT
TRAY FAXITRON CT DISP (TRAY / TRAY PROCEDURE) IMPLANT
TUBE CONNECTING 20X1/4 (TUBING) ×2 IMPLANT
YANKAUER SUCT BULB TIP NO VENT (SUCTIONS) ×2 IMPLANT

## 2020-03-03 NOTE — Op Note (Addendum)
Preoperative diagnosis: Bilateral breast cancer  Postoperative diagnosis: Same  Procedure: Reexcision bilateral breast lumpectomy  Surgeon: Erroll Luna, MD  Anesthesia: General with local anesthetic with 0.25% Marcaine  EBL: 30 cc  Specimen left breast with anterior, posterior, superior, inferior, medial, lateral margins and right breast with the same margins all to pathology.  Drains: None IV fluids: Per anesthesia record  Indications for procedure: Patient is an 80 year old female who had bilateral lumpectomies for breast cancer.  She had multiple positive margins.  I discussed about options of reexcision lumpectomy versus mastectomy.  She was very motivated to preserve her breast and did not wish to undergo bilateral mastectomies.  I discussed the pros and cons of this approach the need for reexcision due to multiple involved and close margins bilaterally.  She understood the potential risk of a other operation at this point and the need for mastectomy if we could not clear her margins.  We discussed potential risk of surgery with her as well at her age and potential complications that could occur.The procedure has been discussed with the patient. Alternatives to surgery have been discussed with the patient.  Risks of surgery include bleeding,  Infection, worsening medical problems, need for more surgery, wound infection, cosmetic deformity, death, DVT, cardiovascular event, seroma formation, death,  and the need for further surgery.   The patient understands and wishes to proceed.   Description of procedure: The patient was met in the holding area and questions were answered.  She was taken back to the operative room and placed supine on the OR table.  After induction of general seizure both breasts were prepped and draped in a sterile fashion and timeout was performed.  She received appropriate preoperative antibiotics.  Left side was done first.  The old incision was opened with the help of  a scalpel and hemostat.  The cerumen was evacuated.  All margins were reexcised and oriented with ink and sent to pathology.  The cavity is made hemostatic with cautery then closed with a deep layer of 3-0 Vicryl and subsequent 4-0 Monocryl subcuticular stitch.  The right side was done in similar fashion.  The cavity is opened in a similar fashion and the cerumen was evacuated.  All margins were reexcised.  There is some oozing from the muscle controlled with cautery and Arista on the side.  Prior to this we irrigated out very well.  Once we placed the Arista all the oozing stopped.  We then closed the wound with 3-0 Vicryl and 4-0 Monocryl.  Dermabond applied.  Breast binder placed.  All counts were found to be correct.  Patient awoke extubated taken to recovery in satisfactory condition.

## 2020-03-03 NOTE — Anesthesia Postprocedure Evaluation (Signed)
Anesthesia Post Note  Patient: [name]  Procedure(s) Performed: RE-EXCISION BILATERAL BREAST LUMPECTOMY (Bilateral Breast)     Patient location during evaluation: PACU Anesthesia Type: General Level of consciousness: awake and alert Pain management: pain level controlled Vital Signs Assessment: post-procedure vital signs reviewed and stable Respiratory status: spontaneous breathing, nonlabored ventilation, respiratory function stable and patient connected to nasal cannula oxygen Cardiovascular status: blood pressure returned to baseline and stable Postop Assessment: no apparent nausea or vomiting Anesthetic complications: no   No complications documented.  Last Vitals:  Vitals:   03/03/20 1600 03/03/20 1630  BP: (!) 140/56 (!) 150/65  Pulse: 90 91  Resp: 11 16  Temp:  (!) 36.4 C  SpO2: 95% 99%    Last Pain:  Vitals:   03/03/20 1600  TempSrc:   PainSc: Asleep                 Catalina Gravel

## 2020-03-03 NOTE — Discharge Instructions (Signed)
[facility] °Office Phone Number [phone] ° °BREAST BIOPSY/ PARTIAL MASTECTOMY: POST OP INSTRUCTIONS ° °Always review your discharge instruction sheet given to you by the facility where your surgery was performed. ° °IF YOU HAVE DISABILITY OR FAMILY LEAVE FORMS, YOU MUST BRING THEM TO THE OFFICE FOR PROCESSING.  DO NOT GIVE THEM TO YOUR DOCTOR. ° °1. A prescription for pain medication may be given to you upon discharge.  Take your pain medication as prescribed, if needed.  If narcotic pain medicine is not needed, then you may take acetaminophen (Tylenol) or ibuprofen (Advil) as needed. °2. Take your usually prescribed medications unless otherwise directed °3. If you need a refill on your pain medication, please contact your pharmacy.  They will contact our office to request authorization.  Prescriptions will not be filled after 5pm or on week-ends. °4. You should eat very light the first 24 hours after surgery, such as soup, crackers, pudding, etc.  Resume your normal diet the day after surgery. °5. Most patients will experience some swelling and bruising in the breast.  Ice packs and a good support bra will help.  Swelling and bruising can take several days to resolve.  °6. It is common to experience some constipation if taking pain medication after surgery.  Increasing fluid intake and taking a stool softener will usually help or prevent this problem from occurring.  A mild laxative (Milk of Magnesia or Miralax) should be taken according to package directions if there are no bowel movements after 48 hours. °7. Unless discharge instructions indicate otherwise, you may remove your bandages 24-48 hours after surgery, and you may shower at that time.  You may have steri-strips (small skin tapes) in place directly over the incision.  These strips should be left on the skin for 7-10 days.  If your surgeon used skin glue on the incision, you may shower in 24 hours.  The glue will flake off over the  next 2-3 weeks.  Any sutures or staples will be removed at the office during your follow-up visit. °8. ACTIVITIES:  You may resume regular daily activities (gradually increasing) beginning the next day.  Wearing a good support bra or sports bra minimizes pain and swelling.  You may have sexual intercourse when it is comfortable. °a. You may drive when you no longer are taking prescription pain medication, you can comfortably wear a seatbelt, and you can safely maneuver your car and apply brakes. °b. RETURN TO WORK:  ______________________________________________________________________________________ °9. You should see your doctor in the office for a follow-up appointment approximately two weeks after your surgery.  Your doctor’s nurse will typically make your follow-up appointment when she calls you with your pathology report.  Expect your pathology report 2-3 business days after your surgery.  You may call to check if you do not hear from us after three days. °10. OTHER INSTRUCTIONS: _______________________________________________________________________________________________ _____________________________________________________________________________________________________________________________________ °_____________________________________________________________________________________________________________________________________ °_____________________________________________________________________________________________________________________________________ ° °WHEN TO CALL YOUR DOCTOR: °1. Fever over 101.0 °2. Nausea and/or vomiting. °3. Extreme swelling or bruising. °4. Continued bleeding from incision. °5. Increased pain, redness, or drainage from the incision. ° °The clinic staff is available to answer your questions during regular business hours.  Please don’t hesitate to call and ask to speak to one of the nurses for clinical concerns.  If you have a medical emergency, go to the nearest  emergency room or call 911.  A surgeon from [facility] is always on call at the hospital. ° °For further questions, please visit [email or web address]  ° ° ° ° °  Post Anesthesia Home Care Instructions ° °Activity: °Get plenty of rest for the remainder of the day. A responsible individual must stay with you for 24 hours following the procedure.  °For the next 24 hours, DO NOT: °-Drive a car °-Operate machinery °-Drink alcoholic beverages °-Take any medication unless instructed by your physician °-Make any legal decisions or sign important papers. ° °Meals: °Start with liquid foods such as gelatin or soup. Progress to regular foods as tolerated. Avoid greasy, spicy, heavy foods. If nausea and/or vomiting occur, drink only clear liquids until the nausea and/or vomiting subsides. Call your physician if vomiting continues. ° °Special Instructions/Symptoms: °Your throat may feel dry or sore from the anesthesia or the breathing tube placed in your throat during surgery. If this causes discomfort, gargle with warm salt water. The discomfort should disappear within 24 hours. ° °If you had a scopolamine patch placed behind your ear for the management of post- operative nausea and/or vomiting: ° °1. The medication in the patch is effective for 72 hours, after which it should be removed.  Wrap patch in a tissue and discard in the trash. Wash hands thoroughly with soap and water. °2. You may remove the patch earlier than 72 hours if you experience unpleasant side effects which may include dry mouth, dizziness or visual disturbances. °3. Avoid touching the patch. Wash your hands with soap and water after contact with the patch. °  ° °

## 2020-03-03 NOTE — Anesthesia Procedure Notes (Signed)
Procedure Name: LMA Insertion Performed by: Astha Probasco M, CRNA Pre-anesthesia Checklist: Patient identified, Emergency Drugs available, Suction available and Patient being monitored Patient Re-evaluated:Patient Re-evaluated prior to induction Oxygen Delivery Method: Circle system utilized Preoxygenation: Pre-oxygenation with 100% oxygen Induction Type: IV induction Ventilation: Mask ventilation without difficulty LMA: LMA inserted LMA Size: 4.0 Number of attempts: 1 Airway Equipment and Method: Bite block Placement Confirmation: positive ETCO2 Tube secured with: Tape Dental Injury: Teeth and Oropharynx as per pre-operative assessment        

## 2020-03-03 NOTE — Interval H&P Note (Signed)
History and Physical Interval Note:  03/03/2020 1:47 PM  [name]  has presented today for surgery, with the diagnosis of BILATERAL BREAST CANCER.  The various methods of treatment have been discussed with the patient and family. After consideration of risks, benefits and other options for treatment, the patient has consented to  Procedure(s): RE-EXCISION BILATERAL BREAST LUMPECTOMY (Bilateral) as a surgical intervention.  The patient's history has been reviewed, patient examined, no change in status, stable for surgery.  I have reviewed the patient's chart and labs.  Questions were answered to the patient's satisfaction.     Turner Daniels MD

## 2020-03-03 NOTE — Transfer of Care (Signed)
Immediate Anesthesia Transfer of Care Note  Patient: [name]  Procedure(s) Performed: RE-EXCISION BILATERAL BREAST LUMPECTOMY (Bilateral Breast)  Patient Location: PACU  Anesthesia Type:General  Level of Consciousness: awake, alert  and oriented  Airway & Oxygen Therapy: Patient Spontanous Breathing and Patient connected to face mask oxygen  Post-op Assessment: Report given to RN and Post -op Vital signs reviewed and stable  Post vital signs: Reviewed and stable  Last Vitals:  Vitals Value Taken Time  BP    Temp    Pulse    Resp    SpO2      Last Pain:  Vitals:   03/03/20 1337  TempSrc: Oral  PainSc: 3       Patients Stated Pain Goal: 3 (93/90/30 0923)  Complications: No complications documented.

## 2020-03-03 NOTE — Anesthesia Preprocedure Evaluation (Signed)
Anesthesia Evaluation  Patient identified by MRN, date of birth, ID band Patient awake    Reviewed: Allergy & Precautions, NPO status , Patient's Chart, lab work & pertinent test results  History of Anesthesia Complications Negative for: history of anesthetic complications  Airway Mallampati: II  TM Distance: >3 FB Neck ROM: Full    Dental no notable dental hx.    Pulmonary COPD,    Pulmonary exam normal        Cardiovascular hypertension, Normal cardiovascular exam     Neuro/Psych Depression RLS    GI/Hepatic negative GI ROS, Neg liver ROS,   Endo/Other  negative endocrine ROS  Renal/GU negative Renal ROS  negative genitourinary   Musculoskeletal negative musculoskeletal ROS (+)   Abdominal   Peds  Hematology negative hematology ROS (+)   Anesthesia Other Findings Day of surgery medications reviewed with patient.  Reproductive/Obstetrics negative OB ROS                            Anesthesia Physical Anesthesia Plan  ASA: II  Anesthesia Plan: General   Post-op Pain Management:    Induction: Intravenous  PONV Risk Score and Plan: 3 and Treatment may vary due to age or medical condition, Ondansetron and Dexamethasone  Airway Management Planned: LMA  Additional Equipment: None  Intra-op Plan:   Post-operative Plan: Extubation in OR  Informed Consent: I have reviewed the patients History and Physical, chart, labs and discussed the procedure including the risks, benefits and alternatives for the proposed anesthesia with the patient or authorized representative who has indicated his/her understanding and acceptance.     Dental advisory given  Plan Discussed with: CRNA  Anesthesia Plan Comments:        Anesthesia Quick Evaluation

## 2020-03-04 ENCOUNTER — Encounter (HOSPITAL_BASED_OUTPATIENT_CLINIC_OR_DEPARTMENT_OTHER): Payer: Self-pay | Admitting: Surgery

## 2020-03-07 LAB — SURGICAL PATHOLOGY

## 2020-03-08 ENCOUNTER — Institutional Professional Consult (permissible substitution): Payer: Medicare HMO | Admitting: Radiation Oncology

## 2020-03-09 ENCOUNTER — Encounter: Payer: Self-pay | Admitting: *Deleted

## 2020-03-09 ENCOUNTER — Ambulatory Visit: Payer: Medicare HMO | Admitting: Radiation Oncology

## 2020-03-14 ENCOUNTER — Encounter: Payer: Self-pay | Admitting: Physical Therapy

## 2020-03-14 ENCOUNTER — Ambulatory Visit: Payer: Medicare HMO | Attending: Surgery | Admitting: Physical Therapy

## 2020-03-14 ENCOUNTER — Other Ambulatory Visit: Payer: Self-pay

## 2020-03-14 DIAGNOSIS — C50411 Malignant neoplasm of upper-outer quadrant of right female breast: Secondary | ICD-10-CM | POA: Diagnosis not present

## 2020-03-14 DIAGNOSIS — R293 Abnormal posture: Secondary | ICD-10-CM | POA: Diagnosis not present

## 2020-03-14 DIAGNOSIS — Z170 Estrogen receptor positive status [ER+]: Secondary | ICD-10-CM | POA: Diagnosis not present

## 2020-03-14 DIAGNOSIS — Z483 Aftercare following surgery for neoplasm: Secondary | ICD-10-CM | POA: Diagnosis not present

## 2020-03-14 NOTE — Patient Instructions (Signed)
            [facility]         [address], [place] [address]         [phone]         Annia Friendly, PT, CLT   After Breast Cancer Class It is recommended you attend the ABC class to be educated on lymphedema risk reduction. This class is free of charge and lasts for 1 hour. It is a 1-time class.  You need to download the Webex app on your computer or your smartphone and we will send you link to the class. You are scheduled for February 7th at 11:00.  Scar massage You can begin gentle scar massage to both breast incisions once it is completely healed (probably another 2 weeks). You can use coconut oil.  Home exercise Program Begin doing the exercises to regain the last bit of range of motion. Continue doing them until you don't feel any stretch when you do them.   Follow up PT: It is recommended you return every 3 months for the first 3 years following surgery to be assessed on the SOZO machine for an L-Dex score. This helps prevent clinically significant lymphedema in 95% of patients. These follow up screens are 15 minute appointments that you are not billed for. You are scheduled for April 25th at 4:00 at [address].

## 2020-03-14 NOTE — Therapy (Signed)
[name], [name], [address] Phone: [phone]   Fax:  [phone]  Physical Therapy Treatment  Patient Details  Name: [name] MRN: [id number] Date of Birth: Jul 03, 1940 Referring Provider (PT): [name]   Encounter Date: 03/14/2020   PT End of Session - 03/14/20 1156    Visit Number 2    Number of Visits 2    PT Start Time 1106    PT Stop Time 1150    PT Time Calculation (min) 44 min    Activity Tolerance Patient tolerated treatment well    Behavior During Therapy [facility] for tasks assessed/performed           Past Medical History:  Diagnosis Date  . Allergy   . Anemia   . Breast cancer ([place])   . Cataract   . COPD (chronic obstructive pulmonary disease) ([place])   . Depression   . Essential hypertension 11/29/2014  . Glaucoma   . Hemoptysis   . Hypothyroidism   . Left bundle branch block 03/16/2005  . Oxygen deficiency    patient was using oxygen at home, her pulmonologist discontinued it and patient stopped using it on Monday Mar 20, 2015  . Restless leg syndrome   . Sinusitis   . Vertigo     Past Surgical History:  Procedure Laterality Date  . ABDOMINAL HYSTERECTOMY    . APPENDECTOMY  2009  . BREAST CYST ASPIRATION Right 06/12/2016  . BREAST LUMPECTOMY WITH RADIOACTIVE SEED AND SENTINEL LYMPH NODE BIOPSY Bilateral 02/04/2020   Procedure: BILATERAL BREAST LUMPECTOMY WITH RADIOACTIVE SEED , RIGHT X 2, LEFT X 1 AND RIGHT SENTINEL LYMPH NODE MAPPING;  Surgeon: Erroll Luna, MD;  Location: [place];  Service: General;  Laterality: Bilateral;  . RE-EXCISION OF BREAST LUMPECTOMY Bilateral 03/03/2020   Procedure: RE-EXCISION BILATERAL BREAST LUMPECTOMY;  Surgeon: Erroll Luna, MD;  Location: [place];  Service: General;  Laterality: Bilateral;  . VESICOVAGINAL FISTULA CLOSURE W/ TAH  1992    There were no vitals filed for this visit.   Subjective  Assessment - 03/14/20 1110    Subjective Patient underwent a right lumpectomy and sentinel node biopsy (1 negative node) and a left lumpectomy on 02/04/2020 followed by a re-excision to obtain clear margins on 03/03/2020. She meets with radiaiton oncology 03/29/2020.    Pertinent History Patient was diagnosed on 10/16/2019 with right grade III invasive ductal carcinoma breast cancer. Patient underwent a right lumpectomy and sentinel node biopsy (1 negative node) on 02/04/2020 followed by a re-excision to obtain clear margins on 03/03/2020.  It is ER positive, PR negative, and HER2 negative with a Ki67 of 40%. She also had a left lumpectomy on the left breast due to DCIS.    Patient Stated Goals See if my arm is doing ok    Currently in Pain? No/denies              [facility] PT Assessment - 03/14/20 0001      Assessment   Medical Diagnosis s/p right lumpectomy and SLNB    Referring Provider (PT) [name]    Onset Date/Surgical Date 02/04/20    Hand Dominance Right    Prior Therapy Baselines      Precautions   Precautions Other (comment)    Precaution Comments recent surgery and right arm lymphedema risk      Restrictions   Weight Bearing Restrictions No      Balance Screen   Has  the patient fallen in the past 6 months No    Has the patient had a decrease in activity level because of a fear of falling?  No    Is the patient reluctant to leave their home because of a fear of falling?  No      Home Environment   Living Environment Private residence    Living Arrangements Alone    Available Help at Discharge Family      Prior Function   Level of [place] Retired    Leisure She has not been walking since surgery due to weather      Cognition   Overall Cognitive Status Within Functional Limits for tasks assessed   Memory deficits     Observation/Other Assessments   Observations All incisions (right and left breast and right axillary) incisions all  appear to be healing well. Her right breast incision around her nipple is still healing from the re-excision. The scar appears deep and tight on both breasts.      Posture/Postural Control   Posture/Postural Control Postural limitations    Postural Limitations Rounded Shoulders;Forward head;Increased thoracic kyphosis      ROM / Strength   AROM / PROM / Strength AROM      AROM   AROM Assessment Site Shoulder    Right/Left Shoulder Right    Right Shoulder Extension 57 Degrees    Right Shoulder Flexion 140 Degrees    Right Shoulder ABduction 155 Degrees    Right Shoulder Internal Rotation 68 Degrees    Right Shoulder External Rotation 60 Degrees      Strength   Overall Strength Within functional limits for tasks performed             LYMPHEDEMA/ONCOLOGY QUESTIONNAIRE - 03/14/20 0001      Type   Cancer Type Right breast cancer      Surgeries   Lumpectomy Date 02/04/20    Sentinel Lymph Node Biopsy Date 02/04/20    Number Lymph Nodes Removed 1      Treatment   Active Chemotherapy Treatment No    Past Chemotherapy Treatment No    Active Radiation Treatment No    Past Radiation Treatment No    Current Hormone Treatment No    Past Hormone Therapy No      What other symptoms do you have   Are you Having Heaviness or Tightness No    Are you having Pain No    Are you having pitting edema No    Is it Hard or Difficult finding clothes that fit No    Do you have infections No    Is there Decreased scar mobility No    [place] Sign No      Lymphedema Assessments   Lymphedema Assessments Upper extremities      Right Upper Extremity Lymphedema   10 cm Proximal to Olecranon Process 23.6 cm    Olecranon Process 22.4 cm    10 cm Proximal to Ulnar Styloid Process 18.4 cm    Just Proximal to Ulnar Styloid Process 14.3 cm    Across Hand at [employer] 17.3 cm    At [place] of 2nd Digit 5.9 cm      Left Upper Extremity Lymphedema   10 cm Proximal to Olecranon Process 23.8 cm     Olecranon Process 22.2 cm    10 cm Proximal to Ulnar Styloid Process 19.2 cm    Just Proximal to Ulnar Styloid Process  14.5 cm    Across Hand at [employer] 17.9 cm    At [place] of 2nd Digit 6 cm              [name] - 03/14/20 0001    Open a tight or new jar Mild difficulty    Do heavy household chores (wash walls, wash floors) No difficulty    Carry a shopping bag or briefcase No difficulty    Wash your back Mild difficulty    Use a knife to cut food No difficulty    Recreational activities in which you take some force or impact through your arm, shoulder, or hand (golf, hammering, tennis) No difficulty    During the past week, to what extent has your arm, shoulder or hand problem interfered with your normal social activities with family, friends, neighbors, or groups? Not at all    During the past week, to what extent has your arm, shoulder or hand problem limited your work or other regular daily activities Not at all    Arm, shoulder, or hand pain. None    Tingling (pins and needles) in your arm, shoulder, or hand None    Difficulty Sleeping No difficulty    DASH Score 4.55 %                          PT Education - 03/14/20 1156    Education Details HEP review; scar massage instructions to begin 2 weeks from now; aftercare    Person(s) Educated Patient    Methods Explanation;Demonstration;Handout    Comprehension Returned demonstration;Verbalized understanding               PT Long Term Goals - 03/14/20 1205      PT LONG TERM GOAL #1   Title Patient will demonstrate she has regained full shoulder ROM and function post operatively compared to baselines.    Time 8    Period Weeks    Status Achieved                 Plan - 03/14/20 1157    Clinical Impression Statement Patient is doing well s/p bil lumpectomies and right sentinel node biopsy on 02/04/2020 with a re-excision on 03/03/2020. Her right breast incision is still healing and  has a small amount of oozing from the incision but does not appear to be infected. It just appears to not have been cleaned. Bilateral breast incisions appear to have deep scars pulling her tissue inward. She sees her surgeon next week and will call us to begin PT if she wants help with scar tissue. Her shoulder ROM is back to baseline and she has no sign of lymphedema. She plans to participate in the After Breast Cancer class on 03/28/2020 for lymphedema education. Otherwise she has no PT needs at this time.    PT Treatment/Interventions ADLs/Self Care Home Management;Therapeutic exercise;Patient/family education    PT Next Visit Plan D/C    Consulted and Agree with Plan of Care Patient           Patient will benefit from skilled therapeutic intervention in order to improve the following deficits and impairments:  Decreased knowledge of precautions,Impaired UE functional use,Pain,Decreased range of motion,Postural dysfunction  Visit Diagnosis: Malignant neoplasm of upper-outer quadrant of right breast in female, estrogen receptor positive ([place])  Abnormal posture  Aftercare following surgery for neoplasm     Problem List Patient Active Problem List  Diagnosis Date Noted  . Malignant neoplasm of upper-outer quadrant of right breast in female, estrogen receptor positive ([place]) 11/17/2019  . Rhinitis, nonallergic, chronic 11/23/2016  . Chronic respiratory failure with hypoxia ([place]) 11/13/2015  . Essential hypertension 11/29/2014  . 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 Allergic sinusitis 07/29/2013  . Unspecified sinusitis (chronic) 04/13/2013  . Cough with hemoptysis 08/01/2010  . Bronchiectasis without acute exacerbation + [facility] 08/01/2010  . HYPOTHYROIDISM NOS 12/17/2006  . Lung nodules 12/17/2006  . DIVERTICULITIS, ACUTE 12/17/2006  . Acute exacerbation of chronic obstructive pulmonary disease (COPD)/ bronchiectasis 12/17/2006   PHYSICAL THERAPY DISCHARGE SUMMARY  Visits  from Start of Care: 2  Current functional level related to goals / functional outcomes: Goals met. See above for objective findings   Remaining deficits: Incisions still healing   Education / Equipment: Lymphedema risk reduction and HEP Plan: Patient agrees to discharge.  Patient goals were met. Patient is being discharged due to meeting the stated rehab goals.  ?????         Annia Friendly, [place] 03/14/20 12:06 PM  [place], [name], [address] Phone: [phone]   Fax:  [phone]  Name: [name] MRN: [id number] Date of Birth: 01/27/1941

## 2020-03-29 ENCOUNTER — Other Ambulatory Visit: Payer: Self-pay

## 2020-03-29 ENCOUNTER — Ambulatory Visit
Admission: RE | Admit: 2020-03-29 | Discharge: 2020-03-29 | Disposition: A | Discharge status: Home / Self Care | Payer: Medicare HMO | Source: Ambulatory Visit | Attending: Radiation Oncology | Admitting: Radiation Oncology

## 2020-03-29 ENCOUNTER — Encounter: Payer: Self-pay | Admitting: Radiation Oncology

## 2020-03-29 VITALS — BP 123/59 | HR 74 | Temp 97.8°F | Resp 20 | Ht 67.0 in | Wt 142.4 lb

## 2020-03-29 DIAGNOSIS — G2581 Restless legs syndrome: Secondary | ICD-10-CM | POA: Diagnosis not present

## 2020-03-29 DIAGNOSIS — F329 Major depressive disorder, single episode, unspecified: Secondary | ICD-10-CM | POA: Insufficient documentation

## 2020-03-29 DIAGNOSIS — Z79899 Other long term (current) drug therapy: Secondary | ICD-10-CM | POA: Insufficient documentation

## 2020-03-29 DIAGNOSIS — E039 Hypothyroidism, unspecified: Secondary | ICD-10-CM | POA: Insufficient documentation

## 2020-03-29 DIAGNOSIS — J449 Chronic obstructive pulmonary disease, unspecified: Secondary | ICD-10-CM | POA: Insufficient documentation

## 2020-03-29 DIAGNOSIS — C50411 Malignant neoplasm of upper-outer quadrant of right female breast: Secondary | ICD-10-CM

## 2020-03-29 DIAGNOSIS — Z170 Estrogen receptor positive status [ER+]: Secondary | ICD-10-CM | POA: Insufficient documentation

## 2020-03-29 DIAGNOSIS — D0512 Intraductal carcinoma in situ of left breast: Secondary | ICD-10-CM | POA: Insufficient documentation

## 2020-03-29 DIAGNOSIS — I10 Essential (primary) hypertension: Secondary | ICD-10-CM | POA: Diagnosis not present

## 2020-03-29 DIAGNOSIS — Z510 Encounter for antineoplastic radiation therapy: Secondary | ICD-10-CM | POA: Diagnosis not present

## 2020-03-29 NOTE — Addendum Note (Signed)
Encounter addended by: Cori Razor, RN on: 03/29/2020 10:13 AM  Actions taken: Flowsheet accepted

## 2020-03-29 NOTE — Progress Notes (Signed)
Radiation Oncology         [phone] ________________________________  Name: [name]        MRN: [id number]  Date of Service: 03/29/2020 DOB: [birth year]  CC:Abigail Jordan, MD  Nicholas Lose, MD     REFERRING PHYSICIAN: Nicholas Lose, MD   DIAGNOSIS: The encounter diagnosis was Malignant neoplasm of upper-outer quadrant of right breast in female, estrogen receptor positive ([place]).   HISTORY OF PRESENT ILLNESS: [name] is a 80 y.o. female originally seen in the multidisciplinary breast clinic for a new diagnosis of right breast cancer. The patient was noted to have a screening detected abnormality in the right breast.  She underwent diagnostic imaging which identified a mass at the 10 o'clock position measuring 1.7 cm in greatest dimension.  A second lesion at the 9:30 position was measured at 9 mm and no evidence of adenopathy was identified.  She underwent a biopsy on 11/12/2019 the 9:30 position revealed benign findings consistent with PASH.  The 10 o'clock position however revealed an invasive ductal carcinoma that was grade 3, her tumor was ER positive, though weak, PR negative, HER-2 negative reflex and to [place] with a Ki-67 of 40%.  She was interested in proceeding with breast conserving surgery, she did undergo additional imaging With MRI on 12/03/2019 that showed a 2.3 cm known area of invasive ductal carcinoma at the 10 o'clock position in the right breast as well as non-mass enhancement in the 3 o'clock position measuring up to 1.2 cm, interestingly in the left breast there was also a 4.2 cm area of nodular enhancement and it was recommended that she undergo additional biopsies, she proceeded with biopsy on 12/16/2018 one of the left breast which revealed high-grade DCIS with necrosis that was ER positive moderate weak staining and PR negative.  A repeat biopsy of the right breast showed DCIS with calcifications and complex sclerosing lesions with usual ductal  hyperplasia and calcifications when performed on 12/25/2019.  She was counseled on the risk of reexcision and possible need for mastectomy with this information and based on the size of these areas.  She underwent bilateral lumpectomy on the right side x2 and left side x1 with right sentinel lymph node mapping, final pathology revealed a grade 3 invasive ductal carcinoma measuring 2.2 cm in the right breast as well as associated high-grade DCIS invasive carcinoma involved the posterior inked margin and within additional excisional margin of the right lateral specimen there was residual invasive and in situ disease extending to the new inked lateral margin, a single lymph node was negative for disease.  Her left lumpectomy specimen revealed a 1.5 cm high-grade DCIS extending to the inferior margin.  She went back for reexcision of her margins on 03/03/2020 for final left anterior, inferior, lateral, medial and posterior, and superior margins were all clear in the left breast.  Her right breast margins were clear in the medial posterior and superior margins.  She is now finished with surgery, she has met with Dr. Lindi Adie who recommends antiestrogen therapy in the adjuvant setting as well as adjuvant radiotherapy.  She is seen today to discuss next steps in order to move forward with radiation.      PREVIOUS RADIATION THERAPY: No   PAST MEDICAL HISTORY:  Past Medical History:  Diagnosis Date  . Allergy   . Anemia   . Breast cancer ([place])   . Cataract   . COPD (chronic obstructive pulmonary disease) ([place])   . Depression   .  Essential hypertension 11/29/2014  . Glaucoma   . Hemoptysis   . Hypothyroidism   . Left bundle branch block 03/16/2005  . Oxygen deficiency    patient was using oxygen at home, her pulmonologist discontinued it and patient stopped using it on Monday Mar 20, 2015  . Restless leg syndrome   . Sinusitis   . Vertigo        PAST SURGICAL HISTORY: Past Surgical History:   Procedure Laterality Date  . ABDOMINAL HYSTERECTOMY    . APPENDECTOMY  2009  . BREAST CYST ASPIRATION Right 06/12/2016  . BREAST LUMPECTOMY WITH RADIOACTIVE SEED AND SENTINEL LYMPH NODE BIOPSY Bilateral 02/04/2020   Procedure: BILATERAL BREAST LUMPECTOMY WITH RADIOACTIVE SEED , RIGHT X 2, LEFT X 1 AND RIGHT SENTINEL LYMPH NODE MAPPING;  Surgeon: Erroll Luna, MD;  Location: [place];  Service: General;  Laterality: Bilateral;  . RE-EXCISION OF BREAST LUMPECTOMY Bilateral 03/03/2020   Procedure: RE-EXCISION BILATERAL BREAST LUMPECTOMY;  Surgeon: Erroll Luna, MD;  Location: [place];  Service: General;  Laterality: Bilateral;  . VESICOVAGINAL FISTULA CLOSURE W/ TAH  1992     FAMILY HISTORY:  Family History  Problem Relation Age of Onset  . Emphysema Mother        smoker  . Heart disease Mother   . COPD Mother   . Colon polyps Mother   . Emphysema Father        smoker  . Heart disease Father   . Breast cancer Sister   . Colon cancer Neg Hx   . Esophageal cancer Neg Hx   . Stomach cancer Neg Hx   . Rectal cancer Neg Hx      SOCIAL HISTORY:  reports that she has never smoked. She has never used smokeless tobacco. She reports that she does not drink alcohol and does not use drugs. The patient is single and lives in [place]. She has 4 adult sons, 3 of which live in the [place] area.   ALLERGIES: Augmentin [amoxicillin-pot clavulanate], Black cohosh, Codeine, Hyoscyamine, Oxybutynin chloride, and Minocycline   MEDICATIONS:  Current Outpatient Medications  Medication Sig Dispense Refill  . acetaminophen (TYLENOL) 500 MG tablet Take 2 tablets (1,000 mg total) by mouth every 6 (six) hours as needed. 40 tablet 2  . albuterol (VENTOLIN HFA) 108 (90 Base) MCG/ACT inhaler Inhale 2 puffs into the lungs every 6 (six) hours as needed for wheezing or shortness of breath. 8 g 12  . azelastine (ASTELIN) 0.1 % nasal spray 1-2 puffs each nostril twice daily if needed  (Patient taking differently: Place 1-2 sprays into both nostrils daily as needed for rhinitis.) 30 mL 12  . benzonatate (TESSALON) 100 MG capsule Take 100 mg by mouth 3 (three) times daily as needed for cough.     Marland Kitchen BETA CAROTENE PO Take 7,500 mcg by mouth daily.    . Cholecalciferol (VITAMIN D) 50 MCG (2000 UT) tablet Take 2,000 Units by mouth daily.    . citalopram (CELEXA) 20 MG tablet Take 20 mg by mouth daily.    . clindamycin (CLEOCIN) 300 MG capsule Take 1 capsule (300 mg total) by mouth 3 (three) times daily. 21 capsule 0  . Coenzyme Q10 (COQ-10) 100 MG CAPS Take 100 mg by mouth daily.    . Cyanocobalamin (B-12 PO) Take 1,000 mg by mouth daily.    . dorzolamide (TRUSOPT) 2 % ophthalmic solution Place 1 drop into both eyes 2 (two) times daily.     . ferrous sulfate 325 (  65 FE) MG tablet Take 325 mg by mouth daily with breakfast.    . fluticasone (FLONASE) 50 MCG/ACT nasal spray Place 2 sprays into both nostrils daily. (Patient taking differently: Place 2 sprays into both nostrils daily as needed for allergies.) 15 g 2  . Fluticasone-Umeclidin-Vilant (TRELEGY ELLIPTA) 100-62.5-25 MCG/INH AEPB Inhale 1 puff into the lungs daily. (Patient not taking: No sig reported) 14 each 0  . FLUZONE HIGH-DOSE QUADRIVALENT 0.7 ML SUSY PHARMACIST ADMINISTERED IMMUNIZATION ADMINISTERED AT TIME OF DISPENSING    . folic acid (FOLVITE) 353 MCG tablet Take 400 mcg by mouth daily.    Marland Kitchen HYDROcodone-acetaminophen (NORCO/VICODIN) 5-325 MG tablet Take 1 tablet by mouth every 6 (six) hours as needed for moderate pain. 15 tablet 0  . Krill Oil 350 MG CAPS Take 350 mg by mouth daily.    Marland Kitchen latanoprost (XALATAN) 0.005 % ophthalmic solution Place 1 drop into both eyes at bedtime.    Marland Kitchen levothyroxine (SYNTHROID, LEVOTHROID) 88 MCG tablet Take 88 mcg by mouth daily before breakfast.    . Multiple Minerals (CALCIUM/MAGNESIUM/ZINC) TABS Take 3 tablets by mouth at bedtime. 1810m     . Multiple Vitamin (MULTIVITAMIN) tablet Take  1 tablet by mouth daily.    . Naproxen Sodium 220 MG CAPS Take 440 mg by mouth daily as needed (pain).    . NONFORMULARY OR COMPOUNDED ITEM [facility] - Antifungal topical - Terbinafine 3%, Fluconazole 2%, Tea Tree Oil 5%, Urea 10%, Ibuprofen 2%, + Itraconazole 3%, in #338mDMSO suspension. Apply to affected toenail(s) once at bedtime or twice daily. (Patient taking differently: Apply 1 application topically daily as needed (Toe fungus). [facility] Antifungal topical - Terbinafine 3%, Fluconazole 2%, Tea Tree Oil 5%, Urea 10%, Ibuprofen 2%, + Itraconazole 3%, in #3072mMSO suspension. Apply to affected toenail(s) once at bedtime or twice daily.) 30 each 11  . ondansetron (ZOFRAN) 4 MG tablet Take 1 tablet (4 mg total) by mouth daily as needed for nausea or vomiting. 30 tablet 1  . ondansetron (ZOFRAN) 4 MG tablet Take 1 tablet (4 mg total) by mouth daily as needed for nausea or vomiting. 30 tablet 1  . rosuvastatin (CRESTOR) 5 MG tablet Take 5 mg by mouth at bedtime.    . vitamin C (ASCORBIC ACID) 500 MG tablet Take 500 mg by mouth daily.    . vitamin E 400 UNIT capsule Take 400 Units by mouth daily.     Current Facility-Administered Medications  Medication Dose Route Frequency Provider Last Rate Last Admin  . 0.9 %  sodium chloride infusion  500 mL Intravenous Continuous Danis, HenEstill CottaI, MD         REVIEW OF SYSTEMS: On review of systems, the patient reports that she is doing well overall. She denies any specific concerns with her breasts. She reports her depression is controlled as long as she takes her prescribed medication. She reports she is doing much better with her memory which was altered after her first surgery.    PHYSICAL EXAM:  Wt Readings from Last 3 Encounters:  03/03/20 139 lb 1.8 oz (63.1 kg)  02/22/20 139 lb 8 oz (63.3 kg)  02/04/20 139 lb 15.9 oz (63.5 kg)   Temp Readings from Last 3 Encounters:  03/03/20 (!) 97.5 F (36.4 C)  02/22/20 (!) 97.2 F  (36.2 C) (Tympanic)  02/04/20 97.8 F (36.6 C)   BP Readings from Last 3 Encounters:  03/03/20 (!) 150/65  02/22/20 (!) 159/65  02/04/20 137/62  Pulse Readings from Last 3 Encounters:  03/03/20 91  02/22/20 64  02/04/20 81    In general this is a well appearing caucasian female in no acute distress. She's alert and oriented x4 and appropriate throughout the examination. Cardiopulmonary assessment is negative for acute distress and she exhibits normal effort. Bilateral breast exam is deferred.    ECOG = 0  0 - Asymptomatic (Fully active, able to carry on all predisease activities without restriction)  1 - Symptomatic but completely ambulatory (Restricted in physically strenuous activity but ambulatory and able to carry out work of a light or sedentary nature. For example, light housework, office work)  2 - Symptomatic, <50% in bed during the day (Ambulatory and capable of all self care but unable to carry out any work activities. Up and about more than 50% of waking hours)  3 - Symptomatic, >50% in bed, but not bedbound (Capable of only limited self-care, confined to bed or chair 50% or more of waking hours)  4 - Bedbound (Completely disabled. Cannot carry on any self-care. Totally confined to bed or chair)  5 - Death   Eustace Pen MM, Creech RH, Tormey DC, et al. [phone]). "Toxicity and response criteria of the [place] [place] [facility] [employer] Group". [place] Oncol. 5 (6): 649-55    LABORATORY DATA:  Lab Results  Component Value Date   WBC 6.0 02/02/2020   HGB 14.7 02/02/2020   HCT 44.8 02/02/2020   MCV 98.2 02/02/2020   PLT 241 02/02/2020   Lab Results  Component Value Date   NA 140 02/02/2020   K 4.0 02/02/2020   CL 104 02/02/2020   CO2 26 02/02/2020   Lab Results  Component Value Date   ALT 22 02/02/2020   AST 30 02/02/2020   ALKPHOS 83 02/02/2020   BILITOT 0.7 02/02/2020      RADIOGRAPHY: No results found.     IMPRESSION/PLAN: 1. Stage IIA, pT2N0M0  grade 3, ER positive invasive ductal carcinoma of the right breast with synchronous High Grade ER positive DCIS of the left breast. Dr. Lisbeth Renshaw discusses the final pathology findings and reviews the nature of right breast disease. The patient has healed well at this time. Dr. Lisbeth Renshaw recommends the rationale for adjuvant external radiotherapy to the breast to reduce the risks of local recurrence, followed by antiestrogen therapy. We discussed the risks, benefits, short, and long term effects of radiotherapy, and the patient is interested in proceeding. Dr. Lisbeth Renshaw discusses the delivery and logistics of radiotherapy and recommends  4  weeks of radiotherapy to bilateral breasts with curative intent. Written consent is obtained and placed in the chart, a copy was provided to the patient. She will simulate this morning.    In a visit lasting 45 minutes, greater than 50% of the time was spent face to face reviewing her case, as well as in preparation of, discussing, and coordinating the patient's care.  The above documentation reflects my direct findings during this shared patient visit. Please see the separate note by Dr. Lisbeth Renshaw on this date for the remainder of the patient's plan of care.    Carola Rhine, PAC

## 2020-03-29 NOTE — Progress Notes (Signed)
New Breast Cancer Diagnosis: Right Breast/Left Breast  Did patient present with symptoms (if so, please note symptoms) or screening mammography?:Screening Mass    Location and Extent of disease :right breast. Located at 10 o'clock position, measured  1.7 cm in greatest dimension. Second lesion at 9:30 position, measured at 9 mm.  Adenopathy no.  Histology per Pathology Report: grade 3, Invasive Ductal Carcinoma  Receptor Status: ER(positive), PR (negative), Her2-neu (negative), Ki-(40%)  Histology per Pathology Report: Left Breast: High grade DCIS with necrosis.  Receptor Status: ER (+), PR (-)  Surgeon and surgical plan, if any: Dr. Brantley Stage -02/04/2020-Bilateral breast lumpectomy with radioactive seed, right x2 with SLN, left x1. -03/03/2020- Re-excision bilateral breast lumpectomy  Medical oncologist, treatment if any:    Lymphedema issues, if any:  No  Pain issues, if any:  no   SAFETY ISSUES: Prior radiation? No Pacemaker/ICD? No  Possible current pregnancy? Postmenopausal Is the patient on methotrexate? No  Current Complaints / other details:

## 2020-03-29 NOTE — Addendum Note (Signed)
Encounter addended by: Cori Razor, RN on: 03/29/2020 11:51 AM  Actions taken: Charge Capture section accepted

## 2020-04-04 ENCOUNTER — Encounter: Payer: Self-pay | Admitting: *Deleted

## 2020-04-04 ENCOUNTER — Telehealth: Payer: Self-pay | Admitting: Hematology and Oncology

## 2020-04-04 NOTE — Telephone Encounter (Signed)
Scheduled appt per 2/14 sch msg - mailed letter with appt date and time

## 2020-04-10 DIAGNOSIS — Z170 Estrogen receptor positive status [ER+]: Secondary | ICD-10-CM | POA: Diagnosis not present

## 2020-04-10 DIAGNOSIS — D0512 Intraductal carcinoma in situ of left breast: Secondary | ICD-10-CM | POA: Diagnosis not present

## 2020-04-10 DIAGNOSIS — C50411 Malignant neoplasm of upper-outer quadrant of right female breast: Secondary | ICD-10-CM | POA: Diagnosis not present

## 2020-04-10 DIAGNOSIS — Z510 Encounter for antineoplastic radiation therapy: Secondary | ICD-10-CM | POA: Diagnosis not present

## 2020-04-11 ENCOUNTER — Ambulatory Visit
Admission: RE | Admit: 2020-04-11 | Discharge: 2020-04-11 | Disposition: A | Discharge status: Home / Self Care | Payer: Medicare HMO | Source: Ambulatory Visit | Attending: Radiation Oncology | Admitting: Radiation Oncology

## 2020-04-11 ENCOUNTER — Other Ambulatory Visit: Payer: Self-pay

## 2020-04-11 DIAGNOSIS — C50411 Malignant neoplasm of upper-outer quadrant of right female breast: Secondary | ICD-10-CM | POA: Diagnosis not present

## 2020-04-11 DIAGNOSIS — Z170 Estrogen receptor positive status [ER+]: Secondary | ICD-10-CM | POA: Diagnosis not present

## 2020-04-11 DIAGNOSIS — D0512 Intraductal carcinoma in situ of left breast: Secondary | ICD-10-CM | POA: Diagnosis not present

## 2020-04-11 DIAGNOSIS — Z510 Encounter for antineoplastic radiation therapy: Secondary | ICD-10-CM | POA: Diagnosis not present

## 2020-04-12 ENCOUNTER — Other Ambulatory Visit: Payer: Self-pay

## 2020-04-12 ENCOUNTER — Ambulatory Visit
Admission: RE | Admit: 2020-04-12 | Discharge: 2020-04-12 | Disposition: A | Discharge status: Home / Self Care | Payer: Medicare HMO | Source: Ambulatory Visit | Attending: Radiation Oncology | Admitting: Radiation Oncology

## 2020-04-12 DIAGNOSIS — C50411 Malignant neoplasm of upper-outer quadrant of right female breast: Secondary | ICD-10-CM | POA: Diagnosis not present

## 2020-04-12 DIAGNOSIS — Z510 Encounter for antineoplastic radiation therapy: Secondary | ICD-10-CM | POA: Diagnosis not present

## 2020-04-12 DIAGNOSIS — D0512 Intraductal carcinoma in situ of left breast: Secondary | ICD-10-CM | POA: Diagnosis not present

## 2020-04-12 DIAGNOSIS — Z170 Estrogen receptor positive status [ER+]: Secondary | ICD-10-CM | POA: Diagnosis not present

## 2020-04-13 ENCOUNTER — Other Ambulatory Visit: Payer: Self-pay

## 2020-04-13 ENCOUNTER — Ambulatory Visit
Admission: RE | Admit: 2020-04-13 | Discharge: 2020-04-13 | Disposition: A | Discharge status: Home / Self Care | Payer: Medicare HMO | Source: Ambulatory Visit | Attending: Radiation Oncology | Admitting: Radiation Oncology

## 2020-04-13 DIAGNOSIS — Z170 Estrogen receptor positive status [ER+]: Secondary | ICD-10-CM | POA: Diagnosis not present

## 2020-04-13 DIAGNOSIS — D0512 Intraductal carcinoma in situ of left breast: Secondary | ICD-10-CM | POA: Diagnosis not present

## 2020-04-13 DIAGNOSIS — Z510 Encounter for antineoplastic radiation therapy: Secondary | ICD-10-CM | POA: Diagnosis not present

## 2020-04-13 DIAGNOSIS — C50411 Malignant neoplasm of upper-outer quadrant of right female breast: Secondary | ICD-10-CM | POA: Diagnosis not present

## 2020-04-14 ENCOUNTER — Ambulatory Visit
Admission: RE | Admit: 2020-04-14 | Discharge: 2020-04-14 | Disposition: A | Discharge status: Home / Self Care | Payer: Medicare HMO | Source: Ambulatory Visit | Attending: Radiation Oncology | Admitting: Radiation Oncology

## 2020-04-14 ENCOUNTER — Other Ambulatory Visit: Payer: Self-pay

## 2020-04-14 DIAGNOSIS — Z510 Encounter for antineoplastic radiation therapy: Secondary | ICD-10-CM | POA: Diagnosis not present

## 2020-04-14 DIAGNOSIS — C50411 Malignant neoplasm of upper-outer quadrant of right female breast: Secondary | ICD-10-CM | POA: Diagnosis not present

## 2020-04-14 DIAGNOSIS — Z170 Estrogen receptor positive status [ER+]: Secondary | ICD-10-CM | POA: Diagnosis not present

## 2020-04-14 DIAGNOSIS — D0512 Intraductal carcinoma in situ of left breast: Secondary | ICD-10-CM | POA: Diagnosis not present

## 2020-04-14 NOTE — Progress Notes (Signed)

## 2020-04-15 ENCOUNTER — Ambulatory Visit
Admission: RE | Admit: 2020-04-15 | Discharge: 2020-04-15 | Disposition: A | Discharge status: Home / Self Care | Payer: Medicare HMO | Source: Ambulatory Visit | Attending: Radiation Oncology | Admitting: Radiation Oncology

## 2020-04-15 ENCOUNTER — Other Ambulatory Visit: Payer: Self-pay

## 2020-04-15 DIAGNOSIS — D0512 Intraductal carcinoma in situ of left breast: Secondary | ICD-10-CM | POA: Diagnosis not present

## 2020-04-15 DIAGNOSIS — C50411 Malignant neoplasm of upper-outer quadrant of right female breast: Secondary | ICD-10-CM | POA: Diagnosis not present

## 2020-04-15 DIAGNOSIS — Z510 Encounter for antineoplastic radiation therapy: Secondary | ICD-10-CM | POA: Diagnosis not present

## 2020-04-15 DIAGNOSIS — Z170 Estrogen receptor positive status [ER+]: Secondary | ICD-10-CM | POA: Diagnosis not present

## 2020-04-15 MED ORDER — RADIAPLEXRX EX GEL: Freq: Once | CUTANEOUS | Status: AC

## 2020-04-15 MED ORDER — ALRA NON-METALLIC DEODORANT (RAD-ONC)
1.0000 "application " | Freq: Once | TOPICAL | Status: AC
  Administered 2020-04-15: 1 via TOPICAL

## 2020-04-18 ENCOUNTER — Ambulatory Visit
Admission: RE | Admit: 2020-04-18 | Discharge: 2020-04-18 | Disposition: A | Discharge status: Home / Self Care | Payer: Medicare HMO | Source: Ambulatory Visit | Attending: Radiation Oncology | Admitting: Radiation Oncology

## 2020-04-18 ENCOUNTER — Other Ambulatory Visit: Payer: Self-pay

## 2020-04-18 DIAGNOSIS — C50411 Malignant neoplasm of upper-outer quadrant of right female breast: Secondary | ICD-10-CM | POA: Diagnosis not present

## 2020-04-18 DIAGNOSIS — Z170 Estrogen receptor positive status [ER+]: Secondary | ICD-10-CM | POA: Diagnosis not present

## 2020-04-18 DIAGNOSIS — Z510 Encounter for antineoplastic radiation therapy: Secondary | ICD-10-CM | POA: Diagnosis not present

## 2020-04-18 DIAGNOSIS — D0512 Intraductal carcinoma in situ of left breast: Secondary | ICD-10-CM | POA: Diagnosis not present

## 2020-04-19 ENCOUNTER — Other Ambulatory Visit: Payer: Self-pay

## 2020-04-19 ENCOUNTER — Ambulatory Visit
Admission: RE | Admit: 2020-04-19 | Discharge: 2020-04-19 | Disposition: A | Discharge status: Home / Self Care | Payer: Medicare HMO | Source: Ambulatory Visit | Attending: Radiation Oncology | Admitting: Radiation Oncology

## 2020-04-19 DIAGNOSIS — D0512 Intraductal carcinoma in situ of left breast: Secondary | ICD-10-CM | POA: Insufficient documentation

## 2020-04-19 DIAGNOSIS — C50411 Malignant neoplasm of upper-outer quadrant of right female breast: Secondary | ICD-10-CM | POA: Insufficient documentation

## 2020-04-19 DIAGNOSIS — Z510 Encounter for antineoplastic radiation therapy: Secondary | ICD-10-CM | POA: Diagnosis not present

## 2020-04-19 DIAGNOSIS — Z170 Estrogen receptor positive status [ER+]: Secondary | ICD-10-CM | POA: Diagnosis not present

## 2020-04-20 ENCOUNTER — Ambulatory Visit
Admission: RE | Admit: 2020-04-20 | Discharge: 2020-04-20 | Disposition: A | Discharge status: Home / Self Care | Payer: Medicare HMO | Source: Ambulatory Visit | Attending: Radiation Oncology | Admitting: Radiation Oncology

## 2020-04-20 ENCOUNTER — Other Ambulatory Visit: Payer: Self-pay

## 2020-04-20 DIAGNOSIS — D0512 Intraductal carcinoma in situ of left breast: Secondary | ICD-10-CM | POA: Diagnosis not present

## 2020-04-20 DIAGNOSIS — Z170 Estrogen receptor positive status [ER+]: Secondary | ICD-10-CM | POA: Diagnosis not present

## 2020-04-20 DIAGNOSIS — Z510 Encounter for antineoplastic radiation therapy: Secondary | ICD-10-CM | POA: Diagnosis not present

## 2020-04-20 DIAGNOSIS — C50411 Malignant neoplasm of upper-outer quadrant of right female breast: Secondary | ICD-10-CM | POA: Diagnosis not present

## 2020-04-21 ENCOUNTER — Ambulatory Visit
Admission: RE | Admit: 2020-04-21 | Discharge: 2020-04-21 | Disposition: A | Discharge status: Home / Self Care | Payer: Medicare HMO | Source: Ambulatory Visit | Attending: Radiation Oncology | Admitting: Radiation Oncology

## 2020-04-21 ENCOUNTER — Other Ambulatory Visit: Payer: Self-pay

## 2020-04-21 DIAGNOSIS — Z170 Estrogen receptor positive status [ER+]: Secondary | ICD-10-CM | POA: Diagnosis not present

## 2020-04-21 DIAGNOSIS — D0512 Intraductal carcinoma in situ of left breast: Secondary | ICD-10-CM | POA: Diagnosis not present

## 2020-04-21 DIAGNOSIS — C50411 Malignant neoplasm of upper-outer quadrant of right female breast: Secondary | ICD-10-CM | POA: Diagnosis not present

## 2020-04-21 DIAGNOSIS — Z510 Encounter for antineoplastic radiation therapy: Secondary | ICD-10-CM | POA: Diagnosis not present

## 2020-04-22 ENCOUNTER — Other Ambulatory Visit: Payer: Self-pay

## 2020-04-22 ENCOUNTER — Ambulatory Visit
Admission: RE | Admit: 2020-04-22 | Discharge: 2020-04-22 | Disposition: A | Discharge status: Home / Self Care | Payer: Medicare HMO | Source: Ambulatory Visit | Attending: Radiation Oncology | Admitting: Radiation Oncology

## 2020-04-22 DIAGNOSIS — D0512 Intraductal carcinoma in situ of left breast: Secondary | ICD-10-CM | POA: Diagnosis not present

## 2020-04-22 DIAGNOSIS — Z170 Estrogen receptor positive status [ER+]: Secondary | ICD-10-CM | POA: Diagnosis not present

## 2020-04-22 DIAGNOSIS — Z510 Encounter for antineoplastic radiation therapy: Secondary | ICD-10-CM | POA: Diagnosis not present

## 2020-04-22 DIAGNOSIS — C50411 Malignant neoplasm of upper-outer quadrant of right female breast: Secondary | ICD-10-CM | POA: Diagnosis not present

## 2020-04-25 ENCOUNTER — Ambulatory Visit
Admission: RE | Admit: 2020-04-25 | Discharge: 2020-04-25 | Disposition: A | Discharge status: Home / Self Care | Payer: Medicare HMO | Source: Ambulatory Visit | Attending: Radiation Oncology | Admitting: Radiation Oncology

## 2020-04-25 DIAGNOSIS — Z170 Estrogen receptor positive status [ER+]: Secondary | ICD-10-CM | POA: Diagnosis not present

## 2020-04-25 DIAGNOSIS — Z510 Encounter for antineoplastic radiation therapy: Secondary | ICD-10-CM | POA: Diagnosis not present

## 2020-04-25 DIAGNOSIS — D0512 Intraductal carcinoma in situ of left breast: Secondary | ICD-10-CM | POA: Diagnosis not present

## 2020-04-25 DIAGNOSIS — C50411 Malignant neoplasm of upper-outer quadrant of right female breast: Secondary | ICD-10-CM | POA: Diagnosis not present

## 2020-04-26 ENCOUNTER — Ambulatory Visit
Admission: RE | Admit: 2020-04-26 | Discharge: 2020-04-26 | Disposition: A | Discharge status: Home / Self Care | Payer: Medicare HMO | Source: Ambulatory Visit | Attending: Radiation Oncology | Admitting: Radiation Oncology

## 2020-04-26 ENCOUNTER — Encounter: Payer: Self-pay | Admitting: Radiation Oncology

## 2020-04-26 ENCOUNTER — Other Ambulatory Visit: Payer: Self-pay

## 2020-04-26 DIAGNOSIS — C50411 Malignant neoplasm of upper-outer quadrant of right female breast: Secondary | ICD-10-CM | POA: Diagnosis not present

## 2020-04-26 DIAGNOSIS — Z510 Encounter for antineoplastic radiation therapy: Secondary | ICD-10-CM | POA: Diagnosis not present

## 2020-04-26 DIAGNOSIS — Z170 Estrogen receptor positive status [ER+]: Secondary | ICD-10-CM | POA: Diagnosis not present

## 2020-04-26 DIAGNOSIS — D0512 Intraductal carcinoma in situ of left breast: Secondary | ICD-10-CM | POA: Diagnosis not present

## 2020-04-27 ENCOUNTER — Ambulatory Visit
Admission: RE | Admit: 2020-04-27 | Discharge: 2020-04-27 | Disposition: A | Discharge status: Home / Self Care | Payer: Medicare HMO | Source: Ambulatory Visit | Attending: Radiation Oncology | Admitting: Radiation Oncology

## 2020-04-27 ENCOUNTER — Other Ambulatory Visit: Payer: Self-pay

## 2020-04-27 DIAGNOSIS — Z510 Encounter for antineoplastic radiation therapy: Secondary | ICD-10-CM | POA: Diagnosis not present

## 2020-04-27 DIAGNOSIS — C50411 Malignant neoplasm of upper-outer quadrant of right female breast: Secondary | ICD-10-CM | POA: Diagnosis not present

## 2020-04-27 DIAGNOSIS — D0512 Intraductal carcinoma in situ of left breast: Secondary | ICD-10-CM | POA: Diagnosis not present

## 2020-04-27 DIAGNOSIS — Z170 Estrogen receptor positive status [ER+]: Secondary | ICD-10-CM | POA: Diagnosis not present

## 2020-04-28 ENCOUNTER — Other Ambulatory Visit: Payer: Self-pay

## 2020-04-28 ENCOUNTER — Ambulatory Visit
Admission: RE | Admit: 2020-04-28 | Discharge: 2020-04-28 | Disposition: A | Discharge status: Home / Self Care | Payer: Medicare HMO | Source: Ambulatory Visit | Attending: Radiation Oncology | Admitting: Radiation Oncology

## 2020-04-28 DIAGNOSIS — Z170 Estrogen receptor positive status [ER+]: Secondary | ICD-10-CM | POA: Diagnosis not present

## 2020-04-28 DIAGNOSIS — Z510 Encounter for antineoplastic radiation therapy: Secondary | ICD-10-CM | POA: Diagnosis not present

## 2020-04-28 DIAGNOSIS — D0512 Intraductal carcinoma in situ of left breast: Secondary | ICD-10-CM | POA: Diagnosis not present

## 2020-04-28 DIAGNOSIS — C50411 Malignant neoplasm of upper-outer quadrant of right female breast: Secondary | ICD-10-CM | POA: Diagnosis not present

## 2020-04-29 ENCOUNTER — Ambulatory Visit
Admission: RE | Admit: 2020-04-29 | Discharge: 2020-04-29 | Disposition: A | Discharge status: Home / Self Care | Payer: Medicare HMO | Source: Ambulatory Visit | Attending: Radiation Oncology | Admitting: Radiation Oncology

## 2020-04-29 ENCOUNTER — Ambulatory Visit: Payer: Medicare HMO | Admitting: Radiation Oncology

## 2020-04-29 DIAGNOSIS — Z510 Encounter for antineoplastic radiation therapy: Secondary | ICD-10-CM | POA: Diagnosis not present

## 2020-04-29 DIAGNOSIS — C50411 Malignant neoplasm of upper-outer quadrant of right female breast: Secondary | ICD-10-CM | POA: Diagnosis not present

## 2020-04-29 DIAGNOSIS — D0512 Intraductal carcinoma in situ of left breast: Secondary | ICD-10-CM | POA: Diagnosis not present

## 2020-04-29 DIAGNOSIS — Z170 Estrogen receptor positive status [ER+]: Secondary | ICD-10-CM | POA: Diagnosis not present

## 2020-05-02 ENCOUNTER — Ambulatory Visit: Payer: Medicare HMO

## 2020-05-02 ENCOUNTER — Ambulatory Visit
Admission: RE | Admit: 2020-05-02 | Discharge: 2020-05-02 | Disposition: A | Discharge status: Home / Self Care | Payer: Medicare HMO | Source: Ambulatory Visit | Attending: Radiation Oncology | Admitting: Radiation Oncology

## 2020-05-02 ENCOUNTER — Ambulatory Visit: Payer: Medicare HMO | Admitting: Radiation Oncology

## 2020-05-02 DIAGNOSIS — Z510 Encounter for antineoplastic radiation therapy: Secondary | ICD-10-CM | POA: Diagnosis not present

## 2020-05-02 DIAGNOSIS — D0512 Intraductal carcinoma in situ of left breast: Secondary | ICD-10-CM | POA: Diagnosis not present

## 2020-05-02 DIAGNOSIS — C50411 Malignant neoplasm of upper-outer quadrant of right female breast: Secondary | ICD-10-CM | POA: Diagnosis not present

## 2020-05-02 DIAGNOSIS — Z170 Estrogen receptor positive status [ER+]: Secondary | ICD-10-CM | POA: Diagnosis not present

## 2020-05-03 ENCOUNTER — Other Ambulatory Visit: Payer: Self-pay

## 2020-05-03 ENCOUNTER — Ambulatory Visit
Admission: RE | Admit: 2020-05-03 | Discharge: 2020-05-03 | Disposition: A | Discharge status: Home / Self Care | Payer: Medicare HMO | Source: Ambulatory Visit | Attending: Radiation Oncology | Admitting: Radiation Oncology

## 2020-05-03 ENCOUNTER — Ambulatory Visit: Payer: Medicare HMO

## 2020-05-03 ENCOUNTER — Encounter: Payer: Self-pay | Admitting: *Deleted

## 2020-05-03 DIAGNOSIS — Z510 Encounter for antineoplastic radiation therapy: Secondary | ICD-10-CM | POA: Diagnosis not present

## 2020-05-03 DIAGNOSIS — D0512 Intraductal carcinoma in situ of left breast: Secondary | ICD-10-CM | POA: Diagnosis not present

## 2020-05-03 DIAGNOSIS — C50411 Malignant neoplasm of upper-outer quadrant of right female breast: Secondary | ICD-10-CM | POA: Diagnosis not present

## 2020-05-03 DIAGNOSIS — Z170 Estrogen receptor positive status [ER+]: Secondary | ICD-10-CM | POA: Diagnosis not present

## 2020-05-04 ENCOUNTER — Ambulatory Visit
Admission: RE | Admit: 2020-05-04 | Discharge: 2020-05-04 | Disposition: A | Discharge status: Home / Self Care | Payer: Medicare HMO | Source: Ambulatory Visit | Attending: Radiation Oncology | Admitting: Radiation Oncology

## 2020-05-04 DIAGNOSIS — Z170 Estrogen receptor positive status [ER+]: Secondary | ICD-10-CM | POA: Diagnosis not present

## 2020-05-04 DIAGNOSIS — Z510 Encounter for antineoplastic radiation therapy: Secondary | ICD-10-CM | POA: Diagnosis not present

## 2020-05-04 DIAGNOSIS — C50411 Malignant neoplasm of upper-outer quadrant of right female breast: Secondary | ICD-10-CM | POA: Diagnosis not present

## 2020-05-04 DIAGNOSIS — D0512 Intraductal carcinoma in situ of left breast: Secondary | ICD-10-CM | POA: Diagnosis not present

## 2020-05-04 NOTE — Progress Notes (Signed)
TELEPHONE VISIT: Patient was supposed to come in person but she finished radiation and left for home.  Therefore we performed a telephone visit today.  After verifying her identity, I performed the assessment and plan dictated below.   Patient Care Team: Jonathon Jordan, MD as PCP - General (Family Medicine) Rockwell Germany, RN as Oncology Nurse Navigator Tressie Ellis, Paulette Blanch, RN as Oncology Nurse Navigator [place], [name], MD as Consulting Physician (General Surgery) Nicholas Lose, MD as Consulting Physician (Hematology and Oncology) Kyung Rudd, MD as Consulting Physician (Radiation Oncology)  DIAGNOSIS:    ICD-10-CM   1. Malignant neoplasm of upper-outer quadrant of right breast in female, estrogen receptor positive ([place])  C50.411    Z17.0     SUMMARY OF ONCOLOGIC HISTORY: Oncology History  Malignant neoplasm of upper-outer quadrant of right breast in female, estrogen receptor positive ([place])  11/12/2019 Initial Diagnosis   Screening mammogram detected a 1.7cm mass at the 10 o'clock position with calcifications, a 0.9cm mass at the 9:30 position, and no right axillary adenopathy. Biopsy showed no malignancy at the 9:30 position, and at the 10 o'clock position, IDC, grade 3, HER-2 equivocal (2+), ER+ 15% weak, PR- 0%, Ki67 40%.    02/04/2020 Surgery   Bilateral lumpectomies ([place]):  Right breast: IDC, grade 3, 2.2cm, high grade DCIS, involved margins, one right axillary lymph node negative for carcinoma Left breast: high grade DCIS, 1.7cm, involved margins.    03/03/2020 Surgery   Re-excision ([place]): no evidence of malignancy bilaterally   04/12/2020 -  Radiation Therapy   Adjuvant radiation     CHIEF COMPLIANT: Follow-up to discuss antiestrogen therapy  INTERVAL HISTORY: [name] is a 80 y.o. with above-mentioned history of right breast cancer and left breast DCIS who underwent bilateral lumpectomies followed by re-excision, and is currently on radiation therapy.  She presents to the clinic today to discuss antiestrogen therapy.   ALLERGIES:  is allergic to augmentin [amoxicillin-pot clavulanate], black cohosh, codeine, hyoscyamine, oxybutynin chloride, and minocycline.  MEDICATIONS:  Current Outpatient Medications  Medication Sig Dispense Refill  . anastrozole (ARIMIDEX) 1 MG tablet Take 1 tablet (1 mg total) by mouth daily. 90 tablet 3  . acetaminophen (TYLENOL) 500 MG tablet Take 2 tablets (1,000 mg total) by mouth every 6 (six) hours as needed. 40 tablet 2  . albuterol (VENTOLIN HFA) 108 (90 Base) MCG/ACT inhaler Inhale 2 puffs into the lungs every 6 (six) hours as needed for wheezing or shortness of breath. 8 g 12  . azelastine (ASTELIN) 0.1 % nasal spray 1-2 puffs each nostril twice daily if needed (Patient taking differently: Place 1-2 sprays into both nostrils daily as needed for rhinitis.) 30 mL 12  . benzonatate (TESSALON) 100 MG capsule Take 100 mg by mouth 3 (three) times daily as needed for cough.     Marland Kitchen BETA CAROTENE PO Take 7,500 mcg by mouth daily.    . Cholecalciferol (VITAMIN D) 50 MCG (2000 UT) tablet Take 2,000 Units by mouth daily.    . citalopram (CELEXA) 20 MG tablet Take 20 mg by mouth daily.    . clindamycin (CLEOCIN) 300 MG capsule Take 1 capsule (300 mg total) by mouth 3 (three) times daily. 21 capsule 0  . Coenzyme Q10 (COQ-10) 100 MG CAPS Take 100 mg by mouth daily.    . Cyanocobalamin (B-12 PO) Take 1,000 mg by mouth daily.    . dorzolamide (TRUSOPT) 2 % ophthalmic solution Place 1 drop into both eyes 2 (two) times daily.     Marland Kitchen  ferrous sulfate 325 (65 FE) MG tablet Take 325 mg by mouth daily with breakfast.    . fluticasone (FLONASE) 50 MCG/ACT nasal spray Place 2 sprays into both nostrils daily. (Patient taking differently: Place 2 sprays into both nostrils daily as needed for allergies.) 15 g 2  . Fluticasone-Umeclidin-Vilant (TRELEGY ELLIPTA) 100-62.5-25 MCG/INH AEPB Inhale 1 puff into the lungs daily. (Patient not taking:  No sig reported) 14 each 0  . FLUZONE HIGH-DOSE QUADRIVALENT 0.7 ML SUSY PHARMACIST ADMINISTERED IMMUNIZATION ADMINISTERED AT TIME OF DISPENSING    . folic acid (FOLVITE) 993 MCG tablet Take 400 mcg by mouth daily.    Marland Kitchen HYDROcodone-acetaminophen (NORCO/VICODIN) 5-325 MG tablet Take 1 tablet by mouth every 6 (six) hours as needed for moderate pain. 15 tablet 0  . Krill Oil 350 MG CAPS Take 350 mg by mouth daily.    Marland Kitchen latanoprost (XALATAN) 0.005 % ophthalmic solution Place 1 drop into both eyes at bedtime.    Marland Kitchen levothyroxine (SYNTHROID, LEVOTHROID) 88 MCG tablet Take 88 mcg by mouth daily before breakfast.    . Multiple Minerals (CALCIUM/MAGNESIUM/ZINC) TABS Take 3 tablets by mouth at bedtime. 1878m     . Multiple Vitamin (MULTIVITAMIN) tablet Take 1 tablet by mouth daily.    . Naproxen Sodium 220 MG CAPS Take 440 mg by mouth daily as needed (pain).    . NONFORMULARY OR COMPOUNDED ITEM [facility] - Antifungal topical - Terbinafine 3%, Fluconazole 2%, Tea Tree Oil 5%, Urea 10%, Ibuprofen 2%, + Itraconazole 3%, in #332mDMSO suspension. Apply to affected toenail(s) once at bedtime or twice daily. (Patient taking differently: Apply 1 application topically daily as needed (Toe fungus). [facility] Antifungal topical - Terbinafine 3%, Fluconazole 2%, Tea Tree Oil 5%, Urea 10%, Ibuprofen 2%, + Itraconazole 3%, in #3046mMSO suspension. Apply to affected toenail(s) once at bedtime or twice daily.) 30 each 11  . ondansetron (ZOFRAN) 4 MG tablet Take 1 tablet (4 mg total) by mouth daily as needed for nausea or vomiting. 30 tablet 1  . ondansetron (ZOFRAN) 4 MG tablet Take 1 tablet (4 mg total) by mouth daily as needed for nausea or vomiting. 30 tablet 1  . rosuvastatin (CRESTOR) 5 MG tablet Take 5 mg by mouth at bedtime.    . vitamin C (ASCORBIC ACID) 500 MG tablet Take 500 mg by mouth daily.    . vitamin E 400 UNIT capsule Take 400 Units by mouth daily.     Current Facility-Administered  Medications  Medication Dose Route Frequency Provider Last Rate Last Admin  . 0.9 %  sodium chloride infusion  500 mL Intravenous Continuous DanNelida MeuseI, MD        PHYSICAL EXAMINATION: ECOG PERFORMANCE STATUS: 1 - Symptomatic but completely ambulatory   LABORATORY DATA:  I have reviewed the data as listed CMP Latest Ref Rng & Units 02/02/2020 12/02/2019 11/18/2019  Glucose 70 - 99 mg/dL 100(H) 77 99  BUN 8 - 23 mg/dL '12 12 14  ' Creatinine 0.44 - 1.00 mg/dL 0.87 0.84 0.87  Sodium 135 - 145 mmol/L 140 139 139  Potassium 3.5 - 5.1 mmol/L 4.0 3.7 3.7  Chloride 98 - 111 mmol/L 104 103 104  CO2 22 - 32 mmol/L '26 29 29  ' Calcium 8.9 - 10.3 mg/dL 9.8 9.6 9.7  Total Protein 6.5 - 8.1 g/dL 7.8 7.6 7.8  Total Bilirubin 0.3 - 1.2 mg/dL 0.7 0.6 0.4  Alkaline Phos 38 - 126 U/L 83 80 99  AST 15 -  41 U/L '30 25 25  ' ALT 0 - 44 U/L '22 17 18    ' Lab Results  Component Value Date   WBC 6.0 02/02/2020   HGB 14.7 02/02/2020   HCT 44.8 02/02/2020   MCV 98.2 02/02/2020   PLT 241 02/02/2020   NEUTROABS 3.2 02/02/2020    ASSESSMENT & PLAN:  Malignant neoplasm of upper-outer quadrant of right breast in female, estrogen receptor positive ([place]) 02/04/2020: ilateral lumpectomies ([place]):  Right breast: IDC, grade 3, 2.2cm, high grade DCIS, involved margins (posterior and lateral), one right axillary lymph node negative for carcinoma Left breast: high grade DCIS, 1.7cm, involved margins (inferior).  HER-2 Negative by FISH, ER+ 15% weak, PR- 0%, Ki67 40%  Treatment Plan: Resection of the positive margins on both breasts.( Jan 2022: No residual cancer) 1. Adjuvant radiation therapy started 04/12/20-05/06/20 2. Followed by antiestrogen therapy.  Anastrozole counseling: We discussed the risks and benefits of anti-estrogen therapy with aromatase inhibitors. These include but not limited to insomnia, hot flashes, mood changes, vaginal dryness, bone density loss, and weight gain. We strongly believe  that the benefits far outweigh the risks. Patient understands these risks and consented to starting treatment. Planned treatment duration is 7 years.   RTC in 3 months for SCP visit    No orders of the defined types were placed in this encounter.  The patient has a good understanding of the overall plan. she agrees with it. she will call with any problems that may develop before the next visit here.  Total time spent: 12 mins including face to face time and time spent for planning, charting and coordination of care  Rulon Eisenmenger, MD, MPH 05/05/2020  I, Molly Dorshimer, am acting as scribe for Dr. Nicholas Lose.  I have reviewed the above documentation for accuracy and completeness, and I agree with the above.

## 2020-05-04 NOTE — Assessment & Plan Note (Signed)
02/04/2020: ilateral lumpectomies ([place]):  Right breast: IDC, grade 3, 2.2cm, high grade DCIS, involved margins (posterior and lateral), one right axillary lymph node negative for carcinoma Left breast: high grade DCIS, 1.7cm, involved margins (inferior).  HER-2 Negative by FISH, ER+ 15% weak, PR- 0%, Ki67 40%  Treatment Plan: Resection of the positive margins on both breasts.( Jan 2022: No residual cancer) 1. Adjuvant radiation therapy started 04/12/20-05/06/20 2. Followed by antiestrogen therapy.  Anastrozole counseling: We discussed the risks and benefits of anti-estrogen therapy with aromatase inhibitors. These include but not limited to insomnia, hot flashes, mood changes, vaginal dryness, bone density loss, and weight gain. We strongly believe that the benefits far outweigh the risks. Patient understands these risks and consented to starting treatment. Planned treatment duration is 7 years.   RTC in 3 months for SCP visit

## 2020-05-05 ENCOUNTER — Inpatient Hospital Stay: Payer: Medicare HMO | Attending: Hematology and Oncology | Admitting: Hematology and Oncology

## 2020-05-05 ENCOUNTER — Ambulatory Visit
Admission: RE | Admit: 2020-05-05 | Discharge: 2020-05-05 | Disposition: A | Discharge status: Home / Self Care | Payer: Medicare HMO | Source: Ambulatory Visit | Attending: Radiation Oncology | Admitting: Radiation Oncology

## 2020-05-05 DIAGNOSIS — Z170 Estrogen receptor positive status [ER+]: Secondary | ICD-10-CM

## 2020-05-05 DIAGNOSIS — D0512 Intraductal carcinoma in situ of left breast: Secondary | ICD-10-CM | POA: Diagnosis not present

## 2020-05-05 DIAGNOSIS — Z510 Encounter for antineoplastic radiation therapy: Secondary | ICD-10-CM | POA: Diagnosis not present

## 2020-05-05 DIAGNOSIS — C50411 Malignant neoplasm of upper-outer quadrant of right female breast: Secondary | ICD-10-CM | POA: Diagnosis not present

## 2020-05-05 MED ORDER — ANASTROZOLE 1 MG PO TABS: 1.0000 mg | ORAL_TABLET | Freq: Every day | ORAL | 3 refills | Status: DC

## 2020-05-06 ENCOUNTER — Telehealth: Payer: Self-pay | Admitting: Hematology and Oncology

## 2020-05-06 ENCOUNTER — Ambulatory Visit
Admission: RE | Admit: 2020-05-06 | Discharge: 2020-05-06 | Disposition: A | Discharge status: Home / Self Care | Payer: Medicare HMO | Source: Ambulatory Visit | Attending: Radiation Oncology | Admitting: Radiation Oncology

## 2020-05-06 ENCOUNTER — Other Ambulatory Visit: Payer: Self-pay

## 2020-05-06 ENCOUNTER — Encounter: Payer: Self-pay | Admitting: Radiation Oncology

## 2020-05-06 DIAGNOSIS — Z170 Estrogen receptor positive status [ER+]: Secondary | ICD-10-CM | POA: Diagnosis not present

## 2020-05-06 DIAGNOSIS — C50411 Malignant neoplasm of upper-outer quadrant of right female breast: Secondary | ICD-10-CM | POA: Diagnosis not present

## 2020-05-06 DIAGNOSIS — Z510 Encounter for antineoplastic radiation therapy: Secondary | ICD-10-CM | POA: Diagnosis not present

## 2020-05-06 DIAGNOSIS — D0512 Intraductal carcinoma in situ of left breast: Secondary | ICD-10-CM | POA: Diagnosis not present

## 2020-05-06 NOTE — Telephone Encounter (Signed)
Scheduled per 3/17 los. Called and spoke with pt, confirmed 6/17 appt

## 2020-05-09 NOTE — Progress Notes (Signed)
  Patient Name: [name] MRN: [id number] DOB: 30-May-1940 Referring Physician: Jonathon Jordan (Profile Not Attached) Date of Service: 05/02/2020 [facility], [place]                                                        End Of Treatment Note  Diagnoses: C50.411-Malignant neoplasm of upper-outer quadrant of right female breast  Cancer Staging: Stage IIA, pT2N0M0 grade 3, ER positive invasive ductal carcinoma of the right breast with synchronous High Grade ER positive DCIS of the left breast.  Intent: Curative  Radiation Treatment Dates:  04/11/2020 through 05/06/2020:  Site Technique Total Dose (Gy) Dose per Fx (Gy) Completed Fx Beam Energies  Breast, Right: Breast_Rt 3D 42.56/42.56 2.66 16/16 6X  Breast, Right: Breast_Rt_Bst 3D 8/8 2 4/4 6X  Breast, Left: Breast_Lt 3D 42.56/42.56 2.66 16/16 6X  Breast, Left: Breast_Lt_Bst 3D 8/8 2 4/4 6X    Narrative: The patient tolerated radiation therapy relatively well. She did have some reported fatigue and anticipated skin changes in the treatment fields.  Plan: The patient will receive a call in about one month from the radiation oncology department. She will continue follow up with Dr. Lindi Adie as well.  ________________________________________________    Carola Rhine, [facility]

## 2020-05-11 NOTE — Progress Notes (Signed)
  Radiation Oncology         [phone] ________________________________  Name: [name] MRN: [id number]  Date: 04/26/2020  DOB: [birth year]  SIMULATION NOTE   NARRATIVE:  The patient underwent simulation today for ongoing radiation therapy.  The existing CT study set was employed for the purpose of virtual treatment planning.  The target and avoidance structures were reviewed and modified as necessary.  Treatment planning then occurred.  The radiation boost prescription was entered and confirmed.  A total of 6 complex treatment devices were fabricated in the form of multi-leaf collimators to shape radiation around the targets while maximally excluding nearby normal structures: 3 fields to treat the right breast boost region and 3 fields to treat the left breast boost region. I have requested : Isodose Plan.    PLAN:  This modified radiation beam arrangement is intended to continue the current radiation dose to an additional 8 Gy in 4 fractions for a total cumulative dose of 50.56 Gy.    ------------------------------------------------  Jodelle Gross, MD, PhD

## 2020-05-26 NOTE — Progress Notes (Signed)
Patient ID: [name], female    DOB: November 06, 1940, 80 y.o.   MRN: [id number]  HPI  F never smoker with hx bronchitis/bronchiectasis and chronic recurrent hemoptysis,  + MAIC.. 09/12/12.complicated by sinusitis, Nocardia,  glaucoma, hx sinusistis  Rx: 02/10/13- zith 500 mg TIW, EMB 1200 TIW, Rif 300 mg TIW. Ended by ID. Nocardia Rx'd Bactrim x 1 year Office spirometry 06/29/2014-moderate obstructive airways disease, FVC 2.17/69%, FEV1 1.39/60%, FEV1/FVC 64%, FEF 25-75 percent 0.75/4  ----------------------------------------------------------------------   11/27/19- 80 year old female never smoker followed for COPD/ chronic bBonchitis/Bronchiectasis/ MAIC, chronic recurrent hemoptysis, Allergic Rhinitis,  Glaucoma, Hypothyroid, Breast Cancer R,   positive MAIC 74/09/1446 complicated by Nocardia, history sinusitis Rx: 02/10/13- zith 500 mg TIW, EMB 1200 TIW, Rif 300 mg TIW. Ended by ID. Nocardia Rx'd Bactrim x 1 year Advair 250,- doesn't remember albuterol HFA, Flonase/ Dymista,  Pending R breast lumpectomy/ Rx for Breast Cancer Covid vax- 3 Phizer Flu vax- done Stable dyspnea on exertion- hills and stairs. Occasional cough, mostly dry. No fever, blood or acute events. Fall season is usually her worst as leaves fall. Nasal stuffiness has begun. Only using occasional Ventolin inhaler. She doesn't remember Advair.  05/27/20-  80 year old female never smoker followed for COPD/ chronic bBonchitis/Bronchiectasis/ MAIC, chronic recurrent hemoptysis, Allergic Rhinitis,  Glaucoma, Hypothyroid, Breast Cancer R,&L/ XRT,   positive MAIC 18/56/3149 complicated by Nocardia, history sinusitis Rx: 02/10/13- zith 500 mg TIW, EMB 1200 TIW, Rif 300 mg TIW. Ended by ID. Nocardia Rx'd Bactrim x 1 year -Ventolin hfa, Astelin nasal, Trelegy 100,  Covid vax- 3 Phizer Flu vax-had Onset in last couple of days- nasal congestion, clear drainage, dry cough, no fever. Doesn't remember Trelegy. Started an otc  decongestant. Just finished XRT for bilateral breast cancers. CXR 11/27/19- IMPRESSION: Biapical scarring with bilateral bronchiectasis. Possible acute superimposed airspace opacities in the right hilar region and left base, suggest short interval radiographic follow-up.  Review of Systems-see HPI   + = positive Constitutional:   No-   weight loss, night sweats, fevers, chills, fatigue, lassitude. HEENT:   No-  headaches, difficulty swallowing, tooth/dental problems, sore throat,       No- sneezing, no-itching, ear ache, +congestion, post nasal drip+,  CV:  No-   chest pain, orthopnea, PND, swelling in lower extremities, anasarca, dizziness, palpitations Resp: +  shortness of breath with exertion or at rest.             productive cough,  + non-productive cough,  No- recent coughing up of blood.               change in color of mucus.  No- wheezing.   Skin: No-   rash or lesions. GI:  No-   heartburn, indigestion, abdominal pain, nausea, vomiting,  GU:  MS:  No-   joint pain or swelling.  . Neuro-     nothing unusual Psych:  No- change in mood or affect. No depression or anxiety.  No memory loss.  Objective:   Physical Exam General- Alert, Oriented, Affect-cheerful, Distress- none acute. Trim. Looks well. Skin- rash-none, lesions- none, excoriation- none Lymphadenopathy- none Head- atraumatic            Eyes- Gross vision intact, PERRLA, conjunctivae clear secretions            Ears- Hearing, canals-normal            Nose- + stuffy, no-Septal dev, mucus, polyps, erosion, perforation  Throat- Mallampati II , mucosa clear , drainage- none, tonsils-  atrophic Neck- flexible , trachea midline, no stridor , thyroid nl, carotid no bruit Chest - symmetrical excursion , unlabored           Heart/CV- RRR/ occ extra beat , no murmur , no gallop  , no rub, nl s1 s2                           - JVD- none , edema- none, stasis changes- none, varices- none           Lung- + few  crackles L base, unlabored, cough+ active dry,  wheeze- none, dullness-none, rub- none           Chest wall-  Abd-  Br/ Gen/ Rectal- Not done, not indicated Extrem- cyanosis- none, clubbing, none, atrophy- none, strength- nl.  Neuro- grossly intact to observation

## 2020-05-27 ENCOUNTER — Ambulatory Visit (INDEPENDENT_AMBULATORY_CARE_PROVIDER_SITE_OTHER): Payer: Medicare HMO

## 2020-05-27 ENCOUNTER — Ambulatory Visit: Payer: Medicare HMO | Admitting: Internal Medicine

## 2020-05-27 ENCOUNTER — Encounter: Payer: Self-pay | Admitting: Internal Medicine

## 2020-05-27 ENCOUNTER — Other Ambulatory Visit: Payer: Self-pay

## 2020-05-27 VITALS — BP 126/70 | HR 90 | Temp 97.5°F | Ht 67.0 in | Wt 138.4 lb

## 2020-05-27 DIAGNOSIS — J441 Chronic obstructive pulmonary disease with (acute) exacerbation: Secondary | ICD-10-CM

## 2020-05-27 DIAGNOSIS — J42 Unspecified chronic bronchitis: Secondary | ICD-10-CM

## 2020-05-27 DIAGNOSIS — J209 Acute bronchitis, unspecified: Secondary | ICD-10-CM | POA: Diagnosis not present

## 2020-05-27 DIAGNOSIS — J309 Allergic rhinitis, unspecified: Secondary | ICD-10-CM

## 2020-05-27 DIAGNOSIS — J479 Bronchiectasis, uncomplicated: Secondary | ICD-10-CM | POA: Diagnosis not present

## 2020-05-27 DIAGNOSIS — R911 Solitary pulmonary nodule: Secondary | ICD-10-CM | POA: Diagnosis not present

## 2020-05-27 IMAGING — DX DG CHEST 2V
2 series · 2 of 2 positions shown · non-contrast
Comparison: Chest radiograph [DATE].  CT [DATE]

CLINICAL DATA: Acute exacerbation of chronic bronchitis.

EXAM:
CHEST - 2 VIEW

[chest pa]
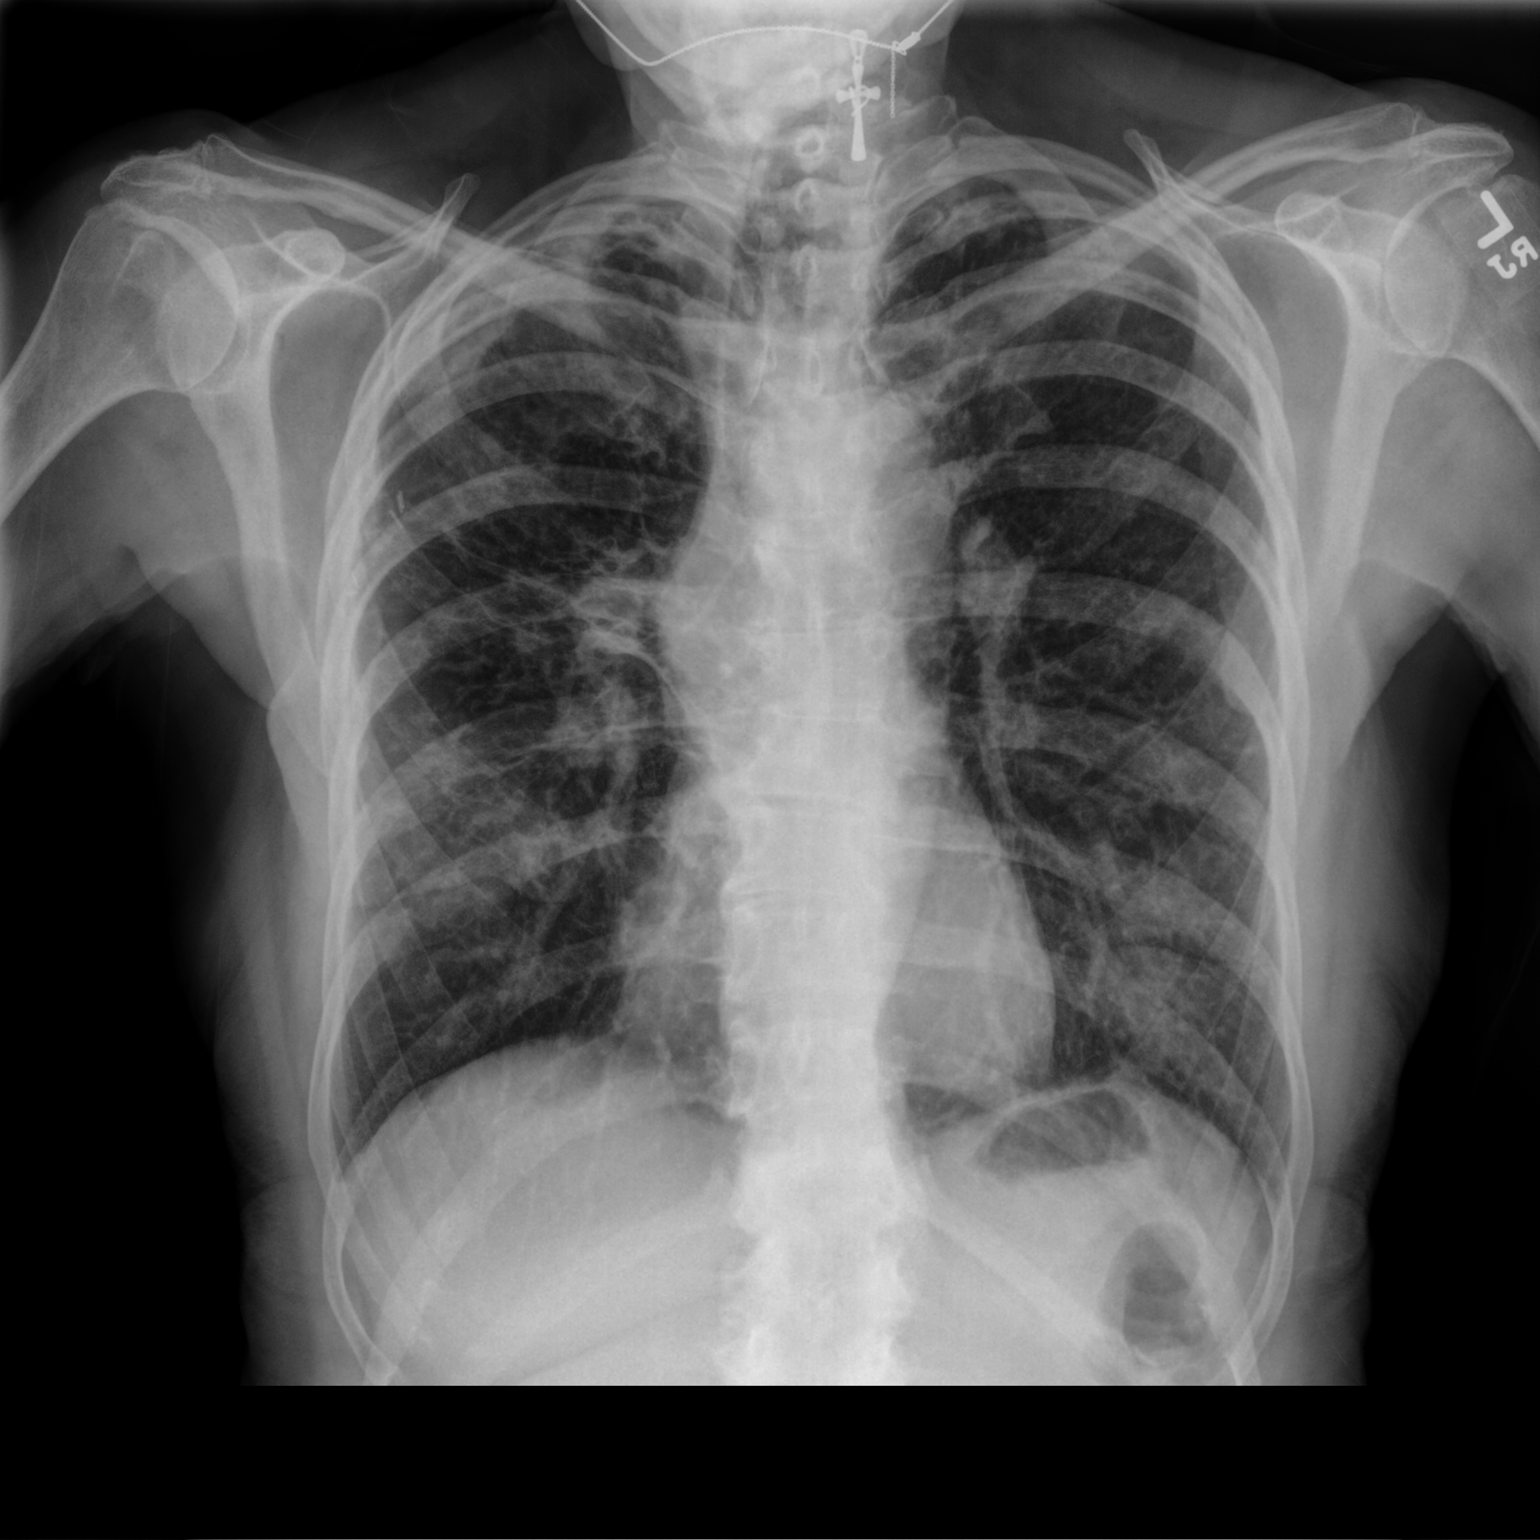

[chest lat]
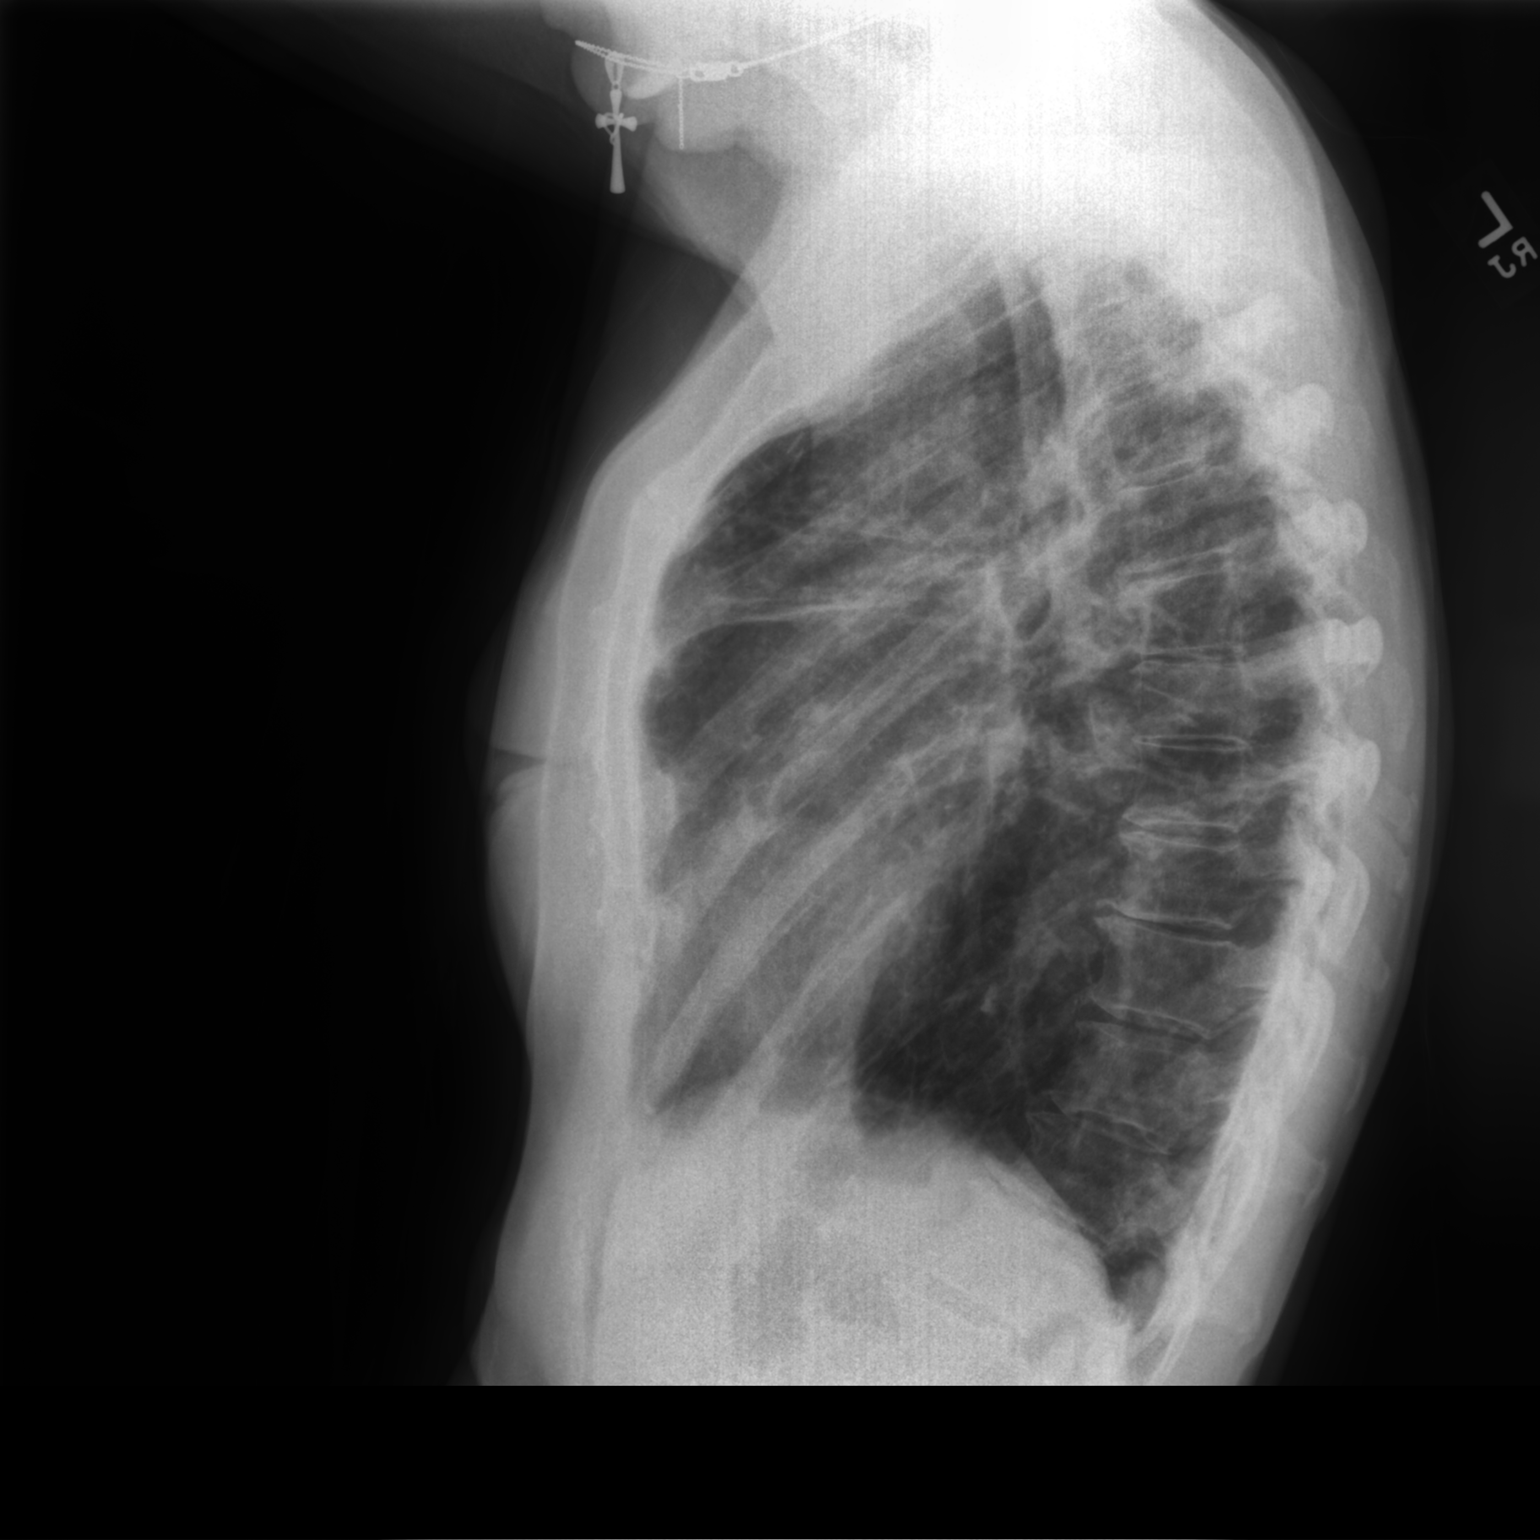

[2 of 2 positions shown; findings below may reference images not displayed]

FINDINGS: Stable heart size and mediastinal contours. Right hilar prominence
is similar to prior exam, likely due to overlapping vascular
structures and adjacent scarring. Biapical pleuroparenchymal
scarring, with bronchiectasis on prior CT. Patient's known pulmonary
nodules are not well seen by radiograph. There are patchy opacities
in both lung bases, similar to prior radiograph on the left, but
increased from prior exam on the right. No pleural fluid or
pneumothorax. Diffuse degenerative change in the spine.
IMPRESSION: 1. Chronic lung disease with multifocal scarring. There are patchy
bibasilar opacities. On the left this is similar to [DATE]
radiograph, but progressed on the right. These findings are
progressive from [DATE] CT. This is favored to represent
progression of indolent infection such as mycobacterium avium
complex. Possibility of superimposed acute infection is also
considered.
2. Patient's known pulmonary nodules are not well seen by
radiograph.

## 2020-05-27 MED ORDER — METHYLPREDNISOLONE ACETATE 80 MG/ML IJ SUSP
80.0000 mg | Freq: Once | INTRAMUSCULAR | Status: AC
  Administered 2020-05-27: 80 mg via INTRAMUSCULAR

## 2020-05-27 MED ORDER — BENZONATATE 200 MG PO CAPS: 200.0000 mg | ORAL_CAPSULE | Freq: Three times a day (TID) | ORAL | 1 refills | Status: AC | PRN

## 2020-05-27 MED ORDER — TRELEGY ELLIPTA 100-62.5-25 MCG/INH IN AEPB: 1.0000 | INHALATION_SPRAY | Freq: Every day | RESPIRATORY_TRACT | 0 refills | Status: DC

## 2020-05-27 NOTE — Assessment & Plan Note (Signed)
Suspect this is seasonal pollen allergy and will wait on antibiotic, but may need ZPAPk later. Plan- Depo 80, Flonase

## 2020-05-27 NOTE — Assessment & Plan Note (Signed)
Tracheobronchitis related to her acute rhinitis. Plan- CXR to compare with last Fall, Tessalon perles, Depomedrol, Samples Trelegy.

## 2020-05-27 NOTE — Patient Instructions (Signed)
Script sent for [employer]  Order- CXR  Dx exacerbation chronic bronchitis  Order- depo 80    Dx exacerb chronic bronchitis  Order- sample x 2 Trelegy 100    Inhale 1 puff then rinse mouth, once daily  Suggest otc nasal spray Flonase    2 puffs each nostril once daily  Please cal as needed

## 2020-06-10 ENCOUNTER — Encounter: Payer: Self-pay | Admitting: Internal Medicine

## 2020-06-13 ENCOUNTER — Ambulatory Visit: Payer: Medicare HMO | Attending: Surgery

## 2020-06-13 ENCOUNTER — Other Ambulatory Visit: Payer: Self-pay

## 2020-06-13 ENCOUNTER — Telehealth: Payer: Self-pay | Admitting: Internal Medicine

## 2020-06-13 DIAGNOSIS — Z483 Aftercare following surgery for neoplasm: Secondary | ICD-10-CM

## 2020-06-13 NOTE — Therapy (Signed)
[place], [name], [address] Phone: [phone]   Fax:  [phone]  Physical Therapy Treatment  Patient Details  Name: [name] MRN: [id number] Date of Birth: [birth year] Referring Provider (PT): Dr. Erroll Luna   Encounter Date: 06/13/2020   PT End of Session - 06/13/20 1603    Visit Number 2   screen only   PT Start Time 1600    PT Stop Time 1606    PT Time Calculation (min) 6 min    Activity Tolerance Patient tolerated treatment well    Behavior During Therapy [facility] for tasks assessed/performed           Past Medical History:  Diagnosis Date  . Allergy   . Anemia   . Breast cancer ([place])   . Cataract   . COPD (chronic obstructive pulmonary disease) ([place])   . Depression   . Essential hypertension 11/29/2014  . Glaucoma   . Hemoptysis   . Hypothyroidism   . Left bundle branch block 03/16/2005  . Oxygen deficiency    patient was using oxygen at home, her pulmonologist discontinued it and patient stopped using it on Monday Mar 20, 2015  . Restless leg syndrome   . Sinusitis   . Vertigo     Past Surgical History:  Procedure Laterality Date  . ABDOMINAL HYSTERECTOMY    . APPENDECTOMY  2009  . BREAST CYST ASPIRATION Right 06/12/2016  . BREAST LUMPECTOMY WITH RADIOACTIVE SEED AND SENTINEL LYMPH NODE BIOPSY Bilateral 02/04/2020   Procedure: BILATERAL BREAST LUMPECTOMY WITH RADIOACTIVE SEED , RIGHT X 2, LEFT X 1 AND RIGHT SENTINEL LYMPH NODE MAPPING;  Surgeon: Erroll Luna, MD;  Location: [facility];  Service: General;  Laterality: Bilateral;  . RE-EXCISION OF BREAST LUMPECTOMY Bilateral 03/03/2020   Procedure: RE-EXCISION BILATERAL BREAST LUMPECTOMY;  Surgeon: Erroll Luna, MD;  Location: [facility];  Service: General;  Laterality: Bilateral;  . VESICOVAGINAL FISTULA CLOSURE W/ TAH  1992    There were no vitals filed for this visit.   Subjective Assessment -  06/13/20 1602    Subjective Pt returns for her 3 month L-Dex screen.    Pertinent History Patient was diagnosed on 10/16/2019 with right grade III invasive ductal carcinoma breast cancer. Patient underwent a right lumpectomy and sentinel node biopsy (1 negative node) on 02/04/2020 followed by a re-excision to obtain clear margins on 03/03/2020.  It is ER positive, PR negative, and HER2 negative with a Ki67 of 40%. She also had a left lumpectomy on the left breast due to DCIS.                  L-DEX FLOWSHEETS - 06/13/20 1600      L-DEX LYMPHEDEMA SCREENING   Measurement Type Unilateral    L-DEX MEASUREMENT EXTREMITY Upper Extremity    POSITION  Standing    DOMINANT SIDE Right    At Risk Side Right    BASELINE SCORE (UNILATERAL) -5.2    L-DEX SCORE (UNILATERAL) -4.9    VALUE CHANGE (UNILAT) 0.3                                  PT Long Term Goals - 03/14/20 1205      PT LONG TERM GOAL #1   Title Patient will demonstrate she has regained full shoulder ROM and function post operatively compared to baselines.    Time 8  Period Weeks    Status Achieved                 Plan - 06/13/20 1608    Clinical Impression Statement Pt returns for her 3 month L-Dex screen. Her change from baseline of 0.3 is WNLs so no further treatment is required at this time except to cont every 3 month L-Dex screens which pt is agreeable to. She did have some questions about lymphedema and what to look for so educated her about the ABC class and she is interested in this so will have her sign up for this as well.    PT Next Visit Plan Pt to attend ABC Class and cont every 3 month L-Dex screens for up to 2 years from her SLNB.    Consulted and Agree with Plan of Care Patient           Patient will benefit from skilled therapeutic intervention in order to improve the following deficits and impairments:     Visit Diagnosis: Aftercare following surgery for  neoplasm     Problem List Patient Active Problem List   Diagnosis Date Noted  . Ductal carcinoma in situ (DCIS) of left breast 03/29/2020  . Malignant neoplasm of upper-outer quadrant of right breast in female, estrogen receptor positive ([place]) 11/17/2019  . Rhinitis, nonallergic, chronic 11/23/2016  . Chronic respiratory failure with hypoxia ([place]) 11/13/2015  . Essential hypertension 11/29/2014  . 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 Allergic sinusitis 07/29/2013  . Unspecified sinusitis (chronic) 04/13/2013  . Cough with hemoptysis 08/01/2010  . Bronchiectasis without acute exacerbation + [facility] 08/01/2010  . HYPOTHYROIDISM NOS 12/17/2006  . Lung nodules 12/17/2006  . DIVERTICULITIS, ACUTE 12/17/2006  . Acute exacerbation of chronic obstructive pulmonary disease (COPD)/ bronchiectasis 12/17/2006    Otelia Limes, PTA 06/13/2020, 4:13 PM  [place], [name], [address] Phone: [phone]   Fax:  [phone]  Name: [name] MRN: [id number] Date of Birth: 1940-11-06

## 2020-06-13 NOTE — Telephone Encounter (Signed)
Abigail Lever, MD  05/29/2020 5:58 PM EDT      CXR- there has been some progression of chronic inflammation in the right lung.   Called and spoke with patient. Let her know his results she did not want to make an appointment at this time. She will call us if she needs [place] Nothing further needed at this time.

## 2020-06-20 ENCOUNTER — Telehealth: Payer: Self-pay | Admitting: *Deleted

## 2020-06-20 ENCOUNTER — Ambulatory Visit
Admission: RE | Admit: 2020-06-20 | Discharge: 2020-06-20 | Disposition: A | Discharge status: Home / Self Care | Payer: Medicare HMO | Source: Ambulatory Visit | Attending: Adult Health | Admitting: Adult Health

## 2020-06-20 DIAGNOSIS — Z170 Estrogen receptor positive status [ER+]: Secondary | ICD-10-CM

## 2020-06-20 DIAGNOSIS — C50411 Malignant neoplasm of upper-outer quadrant of right female breast: Secondary | ICD-10-CM

## 2020-06-20 NOTE — Telephone Encounter (Signed)
Received call from pt with complaint of increase in being tired. Pt states she sleeps all day and is unsure if it is related to the anastrozole or stopping radiation treatment.  Per MD pt to stop anastrozole x2 weeks to see if energy levels improve.  Pt verbalized understanding.

## 2020-06-20 NOTE — Progress Notes (Signed)
  Radiation Oncology         [phone] ________________________________  Name: [name] MRN: [id number]  Date of Service: 06/20/2020  DOB: 1940-11-12  Post Treatment Telephone Note  Diagnosis:   StageIIA, pT2N0M0 grade 3, ER positive invasive ductal carcinoma of the right breastwith synchronous High Grade ER positive DCIS of the left breast.  Interval Since Last Radiation:  7 weeks   04/11/2020 through 05/06/2020:  Site Technique Total Dose (Gy) Dose per Fx (Gy) Completed Fx Beam Energies  Breast, Right: Breast_Rt 3D 42.56/42.56 2.66 16/16 6X  Breast, Right: Breast_Rt_Bst 3D 8/8 2 4/4 6X  Breast, Left: Breast_Lt 3D 42.56/42.56 2.66 16/16 6X  Breast, Left: Breast_Lt_Bst 3D 8/8 2 4/4 6X     Narrative:  The patient was contacted today for routine follow-up. During treatment she did very well with radiotherapy and did not have significant desquamation. She reports she is doing well and her skin is nicely healed up and her energy has improved as well.  Impression/Plan: 1. StageIIA, pT2N0M0 grade 3, ER positive invasive ductal carcinoma of the right breastwith synchronous High Grade ER positive DCIS of the left breast. The patient has been doing well since completion of radiotherapy. We discussed that we would be happy to continue to follow her as needed, but she will also continue to follow up with Dr. Lindi Adie in medical oncology. She was counseled on skin care as well as measures to avoid sun exposure to this area.  2. Survivorship. We discussed the importance of survivorship evaluation and encouraged her to attend her upcoming visit with that clinic.     Carola Rhine, PAC

## 2020-06-23 DIAGNOSIS — R5383 Other fatigue: Secondary | ICD-10-CM | POA: Diagnosis not present

## 2020-06-23 DIAGNOSIS — Z79899 Other long term (current) drug therapy: Secondary | ICD-10-CM | POA: Diagnosis not present

## 2020-06-23 DIAGNOSIS — E7800 Pure hypercholesterolemia, unspecified: Secondary | ICD-10-CM | POA: Diagnosis not present

## 2020-06-23 DIAGNOSIS — E039 Hypothyroidism, unspecified: Secondary | ICD-10-CM | POA: Diagnosis not present

## 2020-06-23 DIAGNOSIS — D692 Other nonthrombocytopenic purpura: Secondary | ICD-10-CM | POA: Diagnosis not present

## 2020-06-23 DIAGNOSIS — Z0000 Encounter for general adult medical examination without abnormal findings: Secondary | ICD-10-CM | POA: Diagnosis not present

## 2020-06-23 DIAGNOSIS — E559 Vitamin D deficiency, unspecified: Secondary | ICD-10-CM | POA: Diagnosis not present

## 2020-06-23 DIAGNOSIS — J449 Chronic obstructive pulmonary disease, unspecified: Secondary | ICD-10-CM | POA: Diagnosis not present

## 2020-06-23 DIAGNOSIS — I10 Essential (primary) hypertension: Secondary | ICD-10-CM | POA: Diagnosis not present

## 2020-06-23 DIAGNOSIS — Z23 Encounter for immunization: Secondary | ICD-10-CM | POA: Diagnosis not present

## 2020-07-14 LAB — GLUCOSE, POCT (MANUAL RESULT ENTRY): POC Glucose: 123 mg/dl — AB (ref 70–99)

## 2020-08-05 ENCOUNTER — Inpatient Hospital Stay: Payer: Medicare HMO | Attending: Adult Health | Admitting: Adult Health

## 2020-08-05 ENCOUNTER — Other Ambulatory Visit: Payer: Self-pay

## 2020-08-05 ENCOUNTER — Encounter: Payer: Self-pay | Admitting: Adult Health

## 2020-08-05 VITALS — BP 142/52 | HR 73 | Temp 97.7°F | Resp 16 | Ht 67.0 in | Wt 143.0 lb

## 2020-08-05 DIAGNOSIS — Z836 Family history of other diseases of the respiratory system: Secondary | ICD-10-CM | POA: Diagnosis not present

## 2020-08-05 DIAGNOSIS — Z79899 Other long term (current) drug therapy: Secondary | ICD-10-CM | POA: Diagnosis not present

## 2020-08-05 DIAGNOSIS — Z170 Estrogen receptor positive status [ER+]: Secondary | ICD-10-CM

## 2020-08-05 DIAGNOSIS — Z803 Family history of malignant neoplasm of breast: Secondary | ICD-10-CM | POA: Diagnosis not present

## 2020-08-05 DIAGNOSIS — Z8249 Family history of ischemic heart disease and other diseases of the circulatory system: Secondary | ICD-10-CM | POA: Diagnosis not present

## 2020-08-05 DIAGNOSIS — Z8371 Family history of colonic polyps: Secondary | ICD-10-CM | POA: Diagnosis not present

## 2020-08-05 DIAGNOSIS — Z9049 Acquired absence of other specified parts of digestive tract: Secondary | ICD-10-CM | POA: Diagnosis not present

## 2020-08-05 DIAGNOSIS — C50411 Malignant neoplasm of upper-outer quadrant of right female breast: Secondary | ICD-10-CM | POA: Diagnosis not present

## 2020-08-05 DIAGNOSIS — Z881 Allergy status to other antibiotic agents status: Secondary | ICD-10-CM | POA: Diagnosis not present

## 2020-08-05 DIAGNOSIS — J449 Chronic obstructive pulmonary disease, unspecified: Secondary | ICD-10-CM | POA: Diagnosis not present

## 2020-08-05 DIAGNOSIS — Z885 Allergy status to narcotic agent status: Secondary | ICD-10-CM | POA: Diagnosis not present

## 2020-08-05 DIAGNOSIS — Z880 Allergy status to penicillin: Secondary | ICD-10-CM | POA: Insufficient documentation

## 2020-08-05 DIAGNOSIS — D0512 Intraductal carcinoma in situ of left breast: Secondary | ICD-10-CM | POA: Diagnosis not present

## 2020-08-05 DIAGNOSIS — Z79811 Long term (current) use of aromatase inhibitors: Secondary | ICD-10-CM | POA: Diagnosis not present

## 2020-08-05 NOTE — Progress Notes (Signed)
SURVIVORSHIP  VISIT:   BRIEF ONCOLOGIC HISTORY:  Oncology History  Malignant neoplasm of upper-outer quadrant of right breast in female, estrogen receptor positive ([place])  11/12/2019 Initial Diagnosis   Screening mammogram detected a 1.7cm mass at the 10 o'clock position with calcifications, a 0.9cm mass at the 9:30 position, and no right axillary adenopathy. Biopsy showed no malignancy at the 9:30 position, and at the 10 o'clock position, IDC, grade 3, HER-2 equivocal (2+), ER+ 15% weak, PR- 0%, Ki67 40%.    02/04/2020 Surgery   Bilateral lumpectomies ([place]) [phone]):  Right breast: IDC, grade 3, 2.2cm, high grade DCIS, involved margins, one right axillary lymph node negative for carcinoma Left breast: high grade DCIS, 1.7cm, involved margins.    03/03/2020 Surgery   Re-excision ([place]) [phone]): no evidence of malignancy bilaterally   04/11/2020 - 05/06/2020 Radiation Therapy   The patient initially received a dose of 42.56 Gy in 16 fractions to BILATERAL breasts using whole-breast tangent fields. This was delivered using a 3-D conformal technique. The pt received a boost  to BILATERAL breasts delivering an additional 8 Gy in 4 fractions using a electron boost with 50meV electrons. The total dose was 50.56 Gy to each breast.   04/2020 - 04/2027 Anti-estrogen oral therapy   Anastrozole     INTERVAL HISTORY:  [name] to review her survivorship care plan detailing her treatment course for breast cancer, as well as monitoring long-term side effects of that treatment, education regarding health maintenance, screening, and overall wellness and health promotion.     Overall, [name] reports feeling quite well.  She is taking Anastrozole daily with good tolerance.  She has been increasingly fatigued.  She thought it may be related to the anastrozole and had stopped it for a brief time period, however the fatigue persisted, so she restarted the Anastrozole.  She notes  she walks about twice a week and watches her grandchildren during the day which keeps her busy.   REVIEW OF SYSTEMS:  Review of Systems  Constitutional:  Positive for fatigue. Negative for appetite change, chills, fever and unexpected weight change.  HENT:   Negative for hearing loss, lump/mass and trouble swallowing.   Eyes:  Negative for eye problems and icterus.  Respiratory:  Negative for chest tightness, cough and shortness of breath.   Cardiovascular:  Negative for chest pain, leg swelling and palpitations.  Gastrointestinal:  Negative for abdominal distention, abdominal pain, constipation, diarrhea, nausea and vomiting.  Endocrine: Negative for hot flashes.  Genitourinary:  Negative for difficulty urinating.   Musculoskeletal:  Negative for arthralgias.  Skin:  Negative for itching and rash.  Neurological:  Negative for dizziness, extremity weakness, headaches and numbness.  Hematological:  Negative for adenopathy. Does not bruise/bleed easily.  Psychiatric/Behavioral:  Negative for depression. The patient is not nervous/anxious.   Breast: Denies any new nodularity, masses, tenderness, nipple changes, or nipple discharge.      ONCOLOGY TREATMENT TEAM:  1. Surgeon:  Dr. Brantley Stage at [facility] 2. Medical Oncologist: Dr. Lindi Adie  3. Radiation Oncologist: Dr. Lisbeth Renshaw    PAST MEDICAL/SURGICAL HISTORY:  Past Medical History:  Diagnosis Date   Allergy    Anemia    Breast cancer ([place])    Cataract    COPD (chronic obstructive pulmonary disease) ([place])    Depression    Essential hypertension 11/29/2014   Glaucoma    Hemoptysis    Hypothyroidism    Left bundle branch block 03/16/2005   Oxygen deficiency    patient  was using oxygen at home, her pulmonologist discontinued it and patient stopped using it on Monday Mar 20, 2015   Restless leg syndrome    Sinusitis    Vertigo    Past Surgical History:  Procedure Laterality Date   ABDOMINAL HYSTERECTOMY      APPENDECTOMY  2009   BREAST CYST ASPIRATION Right 06/12/2016   BREAST LUMPECTOMY WITH RADIOACTIVE SEED AND SENTINEL LYMPH NODE BIOPSY Bilateral 02/04/2020   Procedure: BILATERAL BREAST LUMPECTOMY WITH RADIOACTIVE SEED , RIGHT X 2, LEFT X 1 AND RIGHT SENTINEL LYMPH NODE MAPPING;  Surgeon: Erroll Luna, MD;  Location: [facility];  Service: General;  Laterality: Bilateral;   RE-EXCISION OF BREAST LUMPECTOMY Bilateral 03/03/2020   Procedure: RE-EXCISION BILATERAL BREAST LUMPECTOMY;  Surgeon: Erroll Luna, MD;  Location: [facility];  Service: General;  Laterality: Bilateral;   VESICOVAGINAL FISTULA CLOSURE W/ TAH  1992     ALLERGIES:  Allergies  Allergen Reactions   Augmentin [Amoxicillin-Pot Clavulanate] Nausea And Vomiting    .Marland KitchenHas patient had a PCN reaction causing immediate rash, facial/tongue/throat swelling, SOB or lightheadedness with hypotension: No Has patient had a PCN reaction causing severe rash involving mucus membranes or skin necrosis: No Has patient had a PCN reaction that required hospitalization No Has patient had a PCN reaction occurring within the last 10 years: No If all of the above answers are "NO", then may proceed with Cephalosporin use.    Black Cohosh Nausea And Vomiting    Severe GI Upset, sweats   Codeine Nausea And Vomiting    Severe GI upset, sweats   Hyoscyamine Other (See Comments)    Cramps, made urinary symptoms worse   Oxybutynin Chloride Other (See Comments)    Cramps, made urinary symptoms worse   Minocycline Other (See Comments)    Scratchy tongue, swollen lips     CURRENT MEDICATIONS:  Outpatient Encounter Medications as of 08/05/2020  Medication Sig Note   acetaminophen (TYLENOL) 500 MG tablet Take 2 tablets (1,000 mg total) by mouth every 6 (six) hours as needed.    albuterol (VENTOLIN HFA) 108 (90 Base) MCG/ACT inhaler Inhale 2 puffs into the lungs every 6 (six) hours as needed for wheezing or shortness of breath.     anastrozole (ARIMIDEX) 1 MG tablet Take 1 tablet (1 mg total) by mouth daily.    azelastine (ASTELIN) 0.1 % nasal spray 1-2 puffs each nostril twice daily if needed (Patient taking differently: Place 1-2 sprays into both nostrils daily as needed for rhinitis.)    benzonatate (TESSALON) 200 MG capsule Take 1 capsule (200 mg total) by mouth 3 (three) times daily as needed for cough.    BETA CAROTENE PO Take 7,500 mcg by mouth daily.    Cholecalciferol (VITAMIN D) 50 MCG (2000 UT) tablet Take 2,000 Units by mouth daily.    citalopram (CELEXA) 20 MG tablet Take 20 mg by mouth daily.    clindamycin (CLEOCIN) 300 MG capsule Take 1 capsule (300 mg total) by mouth 3 (three) times daily.    Coenzyme Q10 (COQ-10) 100 MG CAPS Take 100 mg by mouth daily.    Cyanocobalamin (B-12 PO) Take 1,000 mg by mouth daily.    dorzolamide (TRUSOPT) 2 % ophthalmic solution Place 1 drop into both eyes 2 (two) times daily.     ferrous sulfate 325 (65 FE) MG tablet Take 325 mg by mouth daily with breakfast.    fluticasone (FLONASE) 50 MCG/ACT nasal spray Place 2 sprays into both nostrils daily. (Patient taking  differently: Place 2 sprays into both nostrils daily as needed for allergies.)    Fluticasone-Umeclidin-Vilant (TRELEGY ELLIPTA) 100-62.5-25 MCG/INH AEPB Inhale 1 puff into the lungs daily. 02/01/2020: Not taking   Fluticasone-Umeclidin-Vilant (TRELEGY ELLIPTA) 100-62.5-25 MCG/INH AEPB Inhale 1 puff into the lungs daily.    folic acid (FOLVITE) 295 MCG tablet Take 400 mcg by mouth daily.    HYDROcodone-acetaminophen (NORCO/VICODIN) 5-325 MG tablet Take 1 tablet by mouth every 6 (six) hours as needed for moderate pain.    Krill Oil 350 MG CAPS Take 350 mg by mouth daily.    latanoprost (XALATAN) 0.005 % ophthalmic solution Place 1 drop into both eyes at bedtime.    levothyroxine (SYNTHROID, LEVOTHROID) 88 MCG tablet Take 88 mcg by mouth daily before breakfast.    Multiple Minerals (CALCIUM/MAGNESIUM/ZINC) TABS Take 3  tablets by mouth at bedtime. 1871m     Multiple Vitamin (MULTIVITAMIN) tablet Take 1 tablet by mouth daily.    Naproxen Sodium 220 MG CAPS Take 440 mg by mouth daily as needed (pain).    NONFORMULARY OR COMPOUNDED ITEM [facility] - Antifungal topical - Terbinafine 3%, Fluconazole 2%, Tea Tree Oil 5%, Urea 10%, Ibuprofen 2%, + Itraconazole 3%, in #355mDMSO suspension. Apply to affected toenail(s) once at bedtime or twice daily. (Patient taking differently: Apply 1 application topically daily as needed (Toe fungus). [facility] Antifungal topical - Terbinafine 3%, Fluconazole 2%, Tea Tree Oil 5%, Urea 10%, Ibuprofen 2%, + Itraconazole 3%, in #3067mMSO suspension. Apply to affected toenail(s) once at bedtime or twice daily.)    ondansetron (ZOFRAN) 4 MG tablet Take 1 tablet (4 mg total) by mouth daily as needed for nausea or vomiting.    rosuvastatin (CRESTOR) 5 MG tablet Take 5 mg by mouth at bedtime.    vitamin C (ASCORBIC ACID) 500 MG tablet Take 500 mg by mouth daily.    vitamin E 400 UNIT capsule Take 400 Units by mouth daily.    Facility-Administered Encounter Medications as of 08/05/2020  Medication   0.9 %  sodium chloride infusion     ONCOLOGIC FAMILY HISTORY:  Family History  Problem Relation Age of Onset   Emphysema Mother        smoker   Heart disease Mother    COPD Mother    Colon polyps Mother    Emphysema Father        smoker   Heart disease Father    Breast cancer Sister    Colon cancer Neg Hx    Esophageal cancer Neg Hx    Stomach cancer Neg Hx    Rectal cancer Neg Hx      GENETIC COUNSELING/TESTING: Not at this time  SOCIAL HISTORY:  Social History   Socioeconomic History   Marital status: Single    Spouse name: Not on file   Number of children: Not on file   Years of education: Not on file   Highest education level: Not on file  Occupational History   Occupation: retired    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   Smoking status:  Never   Smokeless tobacco: Never  Vaping Use   Vaping Use: Never used  Substance and Sexual Activity   Alcohol use: No    Alcohol/week: 0.0 standard drinks   Drug use: No   Sexual activity: Not on file  Other Topics Concern   Not on file  Social History Narrative   Not on file   Social Determinants of Health   Financial  Resource Strain: Not on file  Food Insecurity: Not on file  Transportation Needs: Not on file  Physical Activity: Not on file  Stress: Not on file  Social Connections: Not on file  Intimate Partner Violence: Not At Risk   Fear of Current or Ex-Partner: No   Emotionally Abused: No   Physically Abused: No   Sexually Abused: No     OBSERVATIONS/OBJECTIVE:  BP (!) 142/52 (BP Location: Left Arm, Patient Position: Sitting)   Pulse 73   Temp 97.7 F (36.5 C) (Temporal)   Resp 16   Ht _0  (1.702 m)   Wt 143 lb (64.9 kg)   SpO2 95%   BMI 22.40 kg/m  GENERAL: Patient is a well appearing female in no acute distress HEENT:  Sclerae anicteric.  Oropharynx clear and moist. No ulcerations or evidence of oropharyngeal candidiasis. Neck is supple.  NODES:  No cervical, supraclavicular, or axillary lymphadenopathy palpated.  BREAST EXAM:  s/p bilateral lumpectomies and radiation, no sign of local recurrence LUNGS:  Clear to auscultation bilaterally.  No wheezes or rhonchi. HEART:  Regular rate and rhythm. No murmur appreciated. ABDOMEN:  Soft, nontender.  Positive, normoactive bowel sounds. No organomegaly palpated. MSK:  No focal spinal tenderness to palpation. Full range of motion bilaterally in the upper extremities. EXTREMITIES:  No peripheral edema.   SKIN:  Clear with no obvious rashes or skin changes. No nail dyscrasia. NEURO:  Nonfocal. Well oriented.  Appropriate affect.  LABORATORY DATA:  None for this visit.  DIAGNOSTIC IMAGING:  None for this visit.      ASSESSMENT AND PLAN:  Ms.. [name] is a pleasant 80 y.o. female with Stage IIA right  breast cancer and left sided DCIS, ER+/PR+/HER2-, diagnosed in 10/2019, treated with lumpectomies, adjuvant radiation therapy, and anti-estrogen therapy with Anastrozole beginning in 04/2020.  She presents to the Survivorship Clinic for our initial meeting and routine follow-up post-completion of treatment for breast cancer.    1. Bilateral breast cancer:  [name] is continuing to recover from definitive treatment for breast cancer. She will follow-up with her medical oncologist, Dr. Lindi Adie in 6 months with history and physical exam per surveillance protocol.  She will continue her anti-estrogen therapy with Anastrozole. Thus far, she is tolerating the Anastrozole well, with minimal side effects. She was instructed to make Dr. Lindi Adie or myself aware if she begins to experience any worsening side effects of the medication and I could see her back in clinic to help manage those side effects, as needed. Her mammogram is due 09/2019; orders placed today. Today, a comprehensive survivorship care plan and treatment summary was reviewed with the patient today detailing her breast cancer diagnosis, treatment course, potential late/long-term effects of treatment, appropriate follow-up care with recommendations for the future, and patient education resources.  A copy of this summary, along with a letter will be sent to the patient's primary care provider via mail/fax/In Basket message after today's visit.    2. Fatigue: I suggested that she try ginseng bid and small periods of exercise.  She understands this.  If her fatigue persists, then we or her PCP should see her for further evaluation with lab testing.    3. Bone health:  Given [name]'s age/history of breast cancer and her current treatment regimen including anti-estrogen therapy with Anastrozole, she is at risk for bone demineralization.  Her last DEXA scan was 09/2019 and was normal.  In the meantime, she was encouraged to increase her consumption of  foods  rich in calcium, as well as increase her weight-bearing activities.  She was given education on specific activities to promote bone health.  4. Cancer screening:  Due to [name]'s history and her age, she should receive screening for skin cancers, colon cancer, and gynecologic cancers.  The information and recommendations are listed on the patient's comprehensive care plan/treatment summary and were reviewed in detail with the patient.    5. Health maintenance and wellness promotion: [name] was encouraged to consume 5-7 servings of fruits and vegetables per day. We reviewed the "Nutrition Rainbow" handout, as well as the handout "Take Control of Your Health and Reduce Your Cancer Risk" from the [employer].  She was also encouraged to engage in moderate to vigorous exercise for 30 minutes per day most days of the week. We discussed the [employer] fitness program, which is designed for cancer survivors to help them become more physically fit after cancer treatments.  She was instructed to limit her alcohol consumption and continue to abstain from tobacco use.     6. Support services/counseling: It is not uncommon for this period of the patient's cancer care trajectory to be one of many emotions and stressors.  We discussed how this can be increasingly difficult during the times of quarantine and social distancing due to the COVID-19 pandemic.   She was given information regarding our available services and encouraged to contact me with any questions or for help enrolling in any of our support group/programs.    Follow up instructions:    -Return to cancer center in 6 months for f/u with Dr. Lindi Adie  -Mammogram due in 09/2020 -Follow up with surgery in one year -Bone density 09/2021 -She is welcome to return back to the Survivorship Clinic at any time; no additional follow-up needed at this time.  -Consider referral back to survivorship as a long-term survivor for  continued surveillance  The patient was provided an opportunity to ask questions and all were answered. The patient agreed with the plan and demonstrated an understanding of the instructions.   Total encounter time: 30 minutes in face to face visit time, SCP preparation, chart review, collaboration, and documentation.   Wilber Bihari, NP 08/05/20 10:05 AM Medical Oncology and Hematology [facility] [place], [place] [address] Tel. [phone]    Fax. [phone]  *Total Encounter Time as defined by the Centers for Medicare and Medicaid Services includes, in addition to the face-to-face time of a patient visit (documented in the note above) non-face-to-face time: obtaining and reviewing outside history, ordering and reviewing medications, tests or procedures, care coordination (communications with other health care professionals or caregivers) and documentation in the medical record.

## 2020-08-10 DIAGNOSIS — E039 Hypothyroidism, unspecified: Secondary | ICD-10-CM | POA: Diagnosis not present

## 2020-08-17 DIAGNOSIS — R309 Painful micturition, unspecified: Secondary | ICD-10-CM | POA: Diagnosis not present

## 2020-09-12 ENCOUNTER — Ambulatory Visit: Payer: Medicare HMO

## 2020-10-18 ENCOUNTER — Ambulatory Visit
Admission: RE | Admit: 2020-10-18 | Discharge: 2020-10-18 | Disposition: A | Discharge status: Home / Self Care | Payer: Medicare HMO | Source: Ambulatory Visit | Attending: Adult Health | Admitting: Adult Health

## 2020-10-18 ENCOUNTER — Other Ambulatory Visit: Payer: Self-pay

## 2020-10-18 DIAGNOSIS — R922 Inconclusive mammogram: Secondary | ICD-10-CM | POA: Diagnosis not present

## 2020-10-18 DIAGNOSIS — Z170 Estrogen receptor positive status [ER+]: Secondary | ICD-10-CM

## 2020-10-18 DIAGNOSIS — Z853 Personal history of malignant neoplasm of breast: Secondary | ICD-10-CM | POA: Diagnosis not present

## 2020-10-18 DIAGNOSIS — C50411 Malignant neoplasm of upper-outer quadrant of right female breast: Secondary | ICD-10-CM

## 2020-10-18 DIAGNOSIS — D0512 Intraductal carcinoma in situ of left breast: Secondary | ICD-10-CM

## 2020-10-18 HISTORY — DX: Personal history of irradiation: Z92.3

## 2020-10-18 HISTORY — DX: Personal history of antineoplastic chemotherapy: Z92.21

## 2020-10-18 IMAGING — MG DIGITAL DIAGNOSTIC BILAT W/ TOMO W/ CAD
6 of 10 series · 6 of 26 positions shown · non-contrast
Comparison: Previous exam(s).

CLINICAL DATA: History of bilateral breast cancers in [T1] status
post bilateral lumpectomies.

EXAM:
DIGITAL DIAGNOSTIC BILATERAL MAMMOGRAM WITH TOMOSYNTHESIS AND CAD
TECHNIQUE: Bilateral digital diagnostic mammography and breast tomosynthesis
was performed. The images were evaluated with computer-aided
detection.

[R MLO]
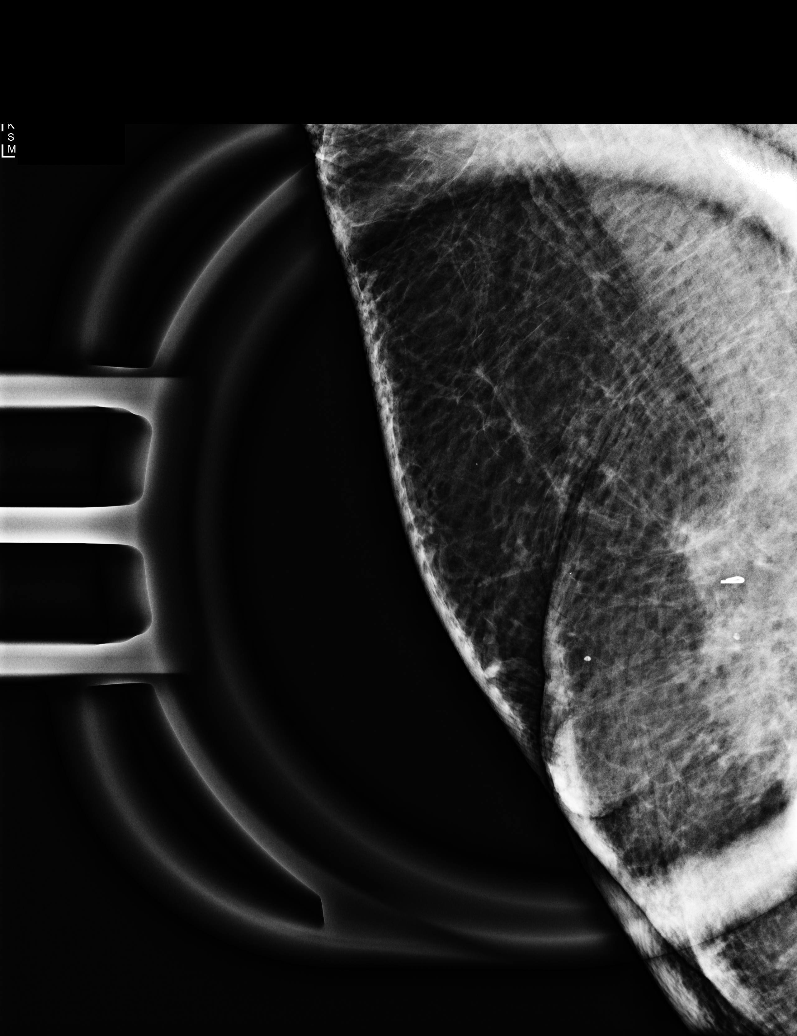

[L MLO]
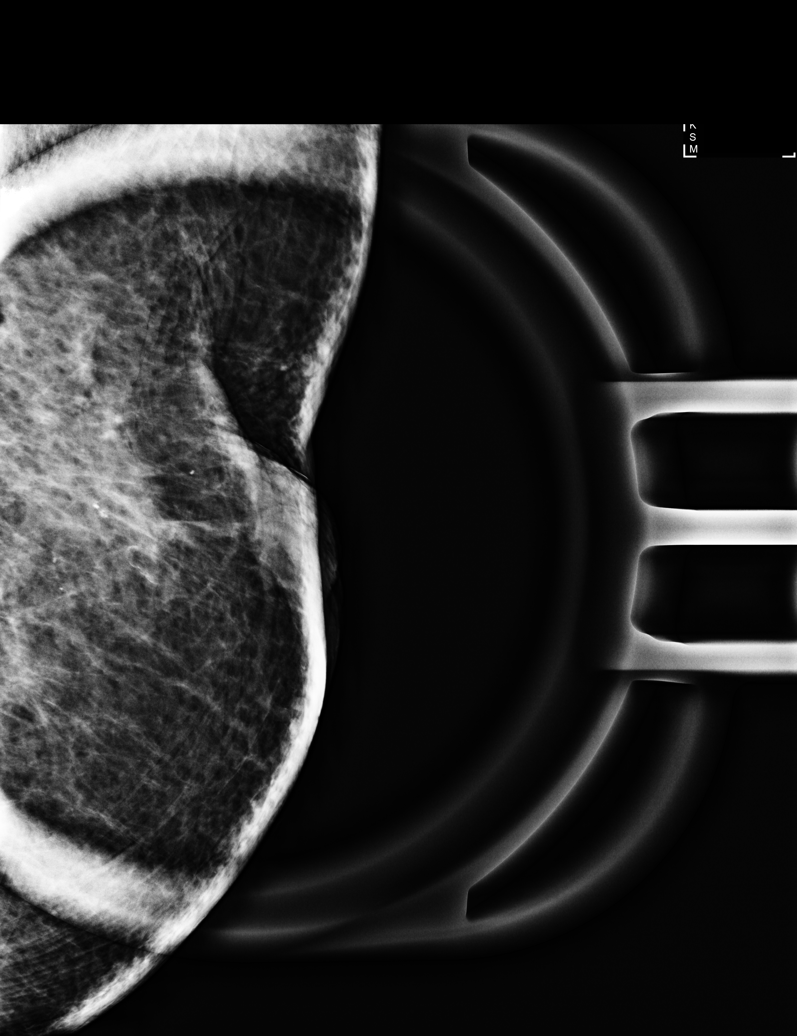

[L CC synth-2D]
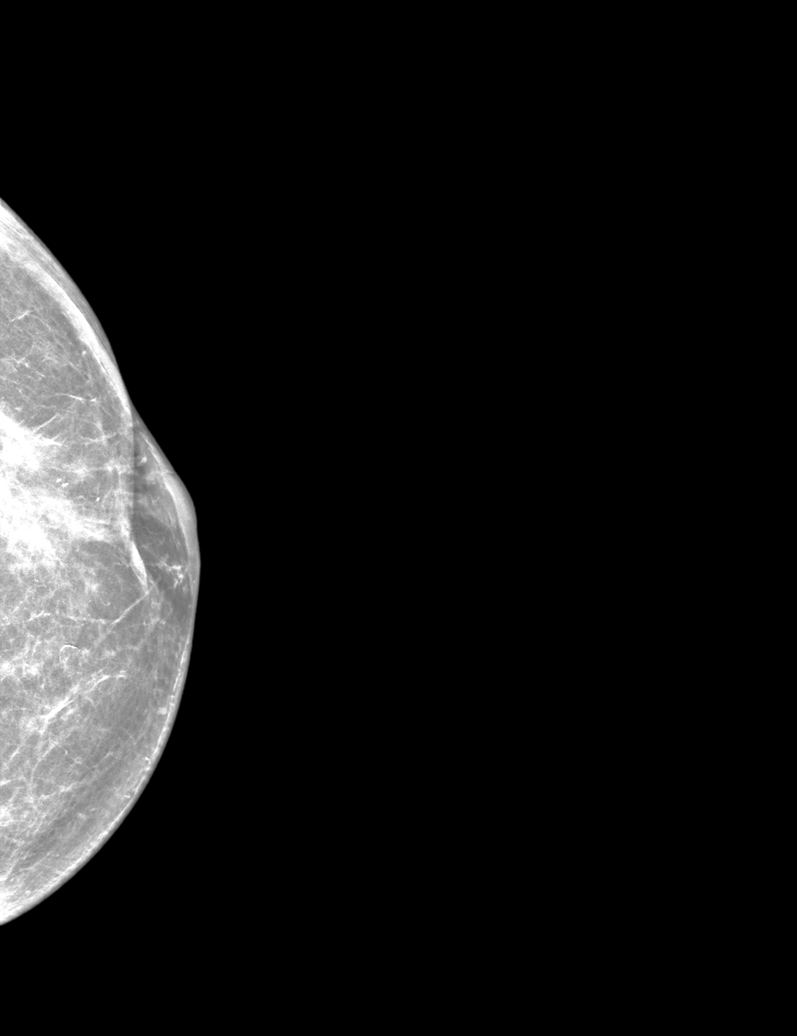

[R CC synth-2D]
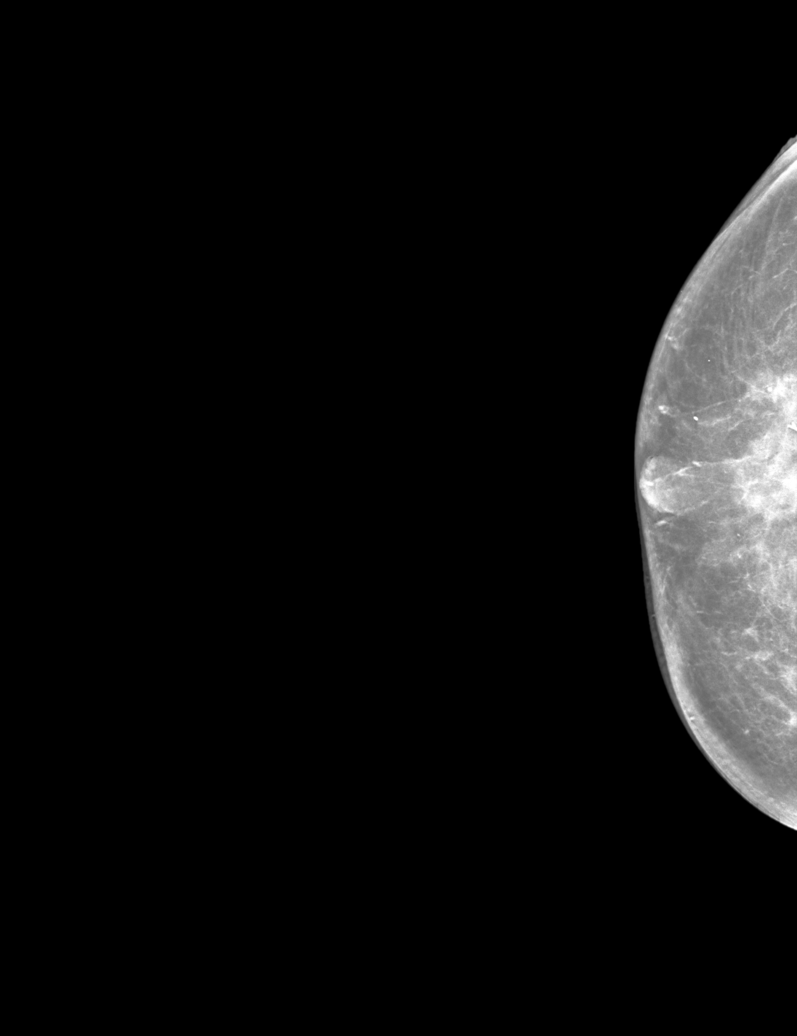

[L MLO synth-2D]
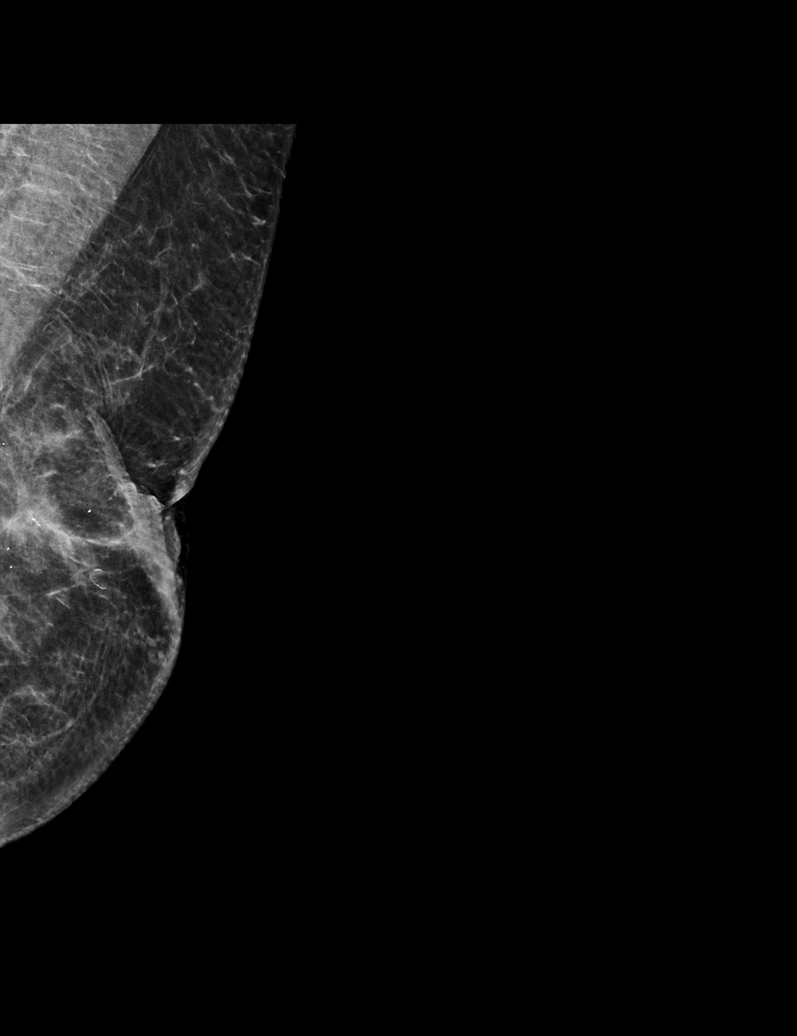

[R MLO synth-2D]
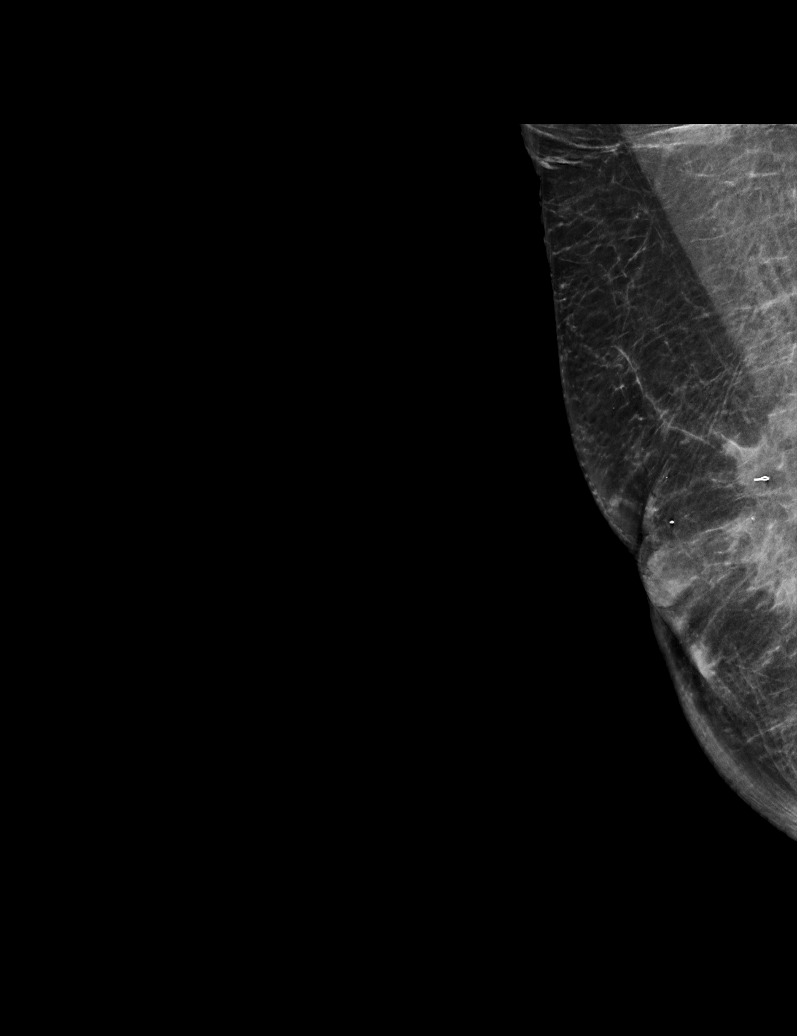

[6 of 26 positions shown; findings below may reference images not displayed]

ACR Breast Density Category c: The breast tissue is heterogeneously
dense, which may obscure small masses.
FINDINGS: There are expected postsurgical changes within each breast. There
are no new dominant masses, suspicious calcifications or secondary
signs of malignancy elsewhere within either breast.
IMPRESSION: No evidence of malignancy within either breast. Expected
postsurgical changes.

RECOMMENDATION:
Bilateral diagnostic mammogram in 1 year.

I have discussed the findings and recommendations with the patient.
If applicable, a reminder letter will be sent to the patient
regarding the next appointment.

BI-RADS CATEGORY  2: Benign.

## 2020-11-16 DIAGNOSIS — E039 Hypothyroidism, unspecified: Secondary | ICD-10-CM | POA: Diagnosis not present

## 2020-12-30 DIAGNOSIS — I10 Essential (primary) hypertension: Secondary | ICD-10-CM | POA: Diagnosis not present

## 2020-12-30 DIAGNOSIS — H409 Unspecified glaucoma: Secondary | ICD-10-CM | POA: Diagnosis not present

## 2020-12-30 DIAGNOSIS — J479 Bronchiectasis, uncomplicated: Secondary | ICD-10-CM | POA: Diagnosis not present

## 2020-12-30 DIAGNOSIS — E7800 Pure hypercholesterolemia, unspecified: Secondary | ICD-10-CM | POA: Diagnosis not present

## 2020-12-30 DIAGNOSIS — E039 Hypothyroidism, unspecified: Secondary | ICD-10-CM | POA: Diagnosis not present

## 2020-12-30 DIAGNOSIS — J449 Chronic obstructive pulmonary disease, unspecified: Secondary | ICD-10-CM | POA: Diagnosis not present

## 2020-12-30 DIAGNOSIS — F322 Major depressive disorder, single episode, severe without psychotic features: Secondary | ICD-10-CM | POA: Diagnosis not present

## 2020-12-30 DIAGNOSIS — J441 Chronic obstructive pulmonary disease with (acute) exacerbation: Secondary | ICD-10-CM | POA: Diagnosis not present

## 2020-12-30 DIAGNOSIS — J471 Bronchiectasis with (acute) exacerbation: Secondary | ICD-10-CM | POA: Diagnosis not present

## 2021-01-05 DIAGNOSIS — Z20822 Contact with and (suspected) exposure to covid-19: Secondary | ICD-10-CM | POA: Diagnosis not present

## 2021-01-10 DIAGNOSIS — J0100 Acute maxillary sinusitis, unspecified: Secondary | ICD-10-CM | POA: Diagnosis not present

## 2021-01-10 DIAGNOSIS — R051 Acute cough: Secondary | ICD-10-CM | POA: Diagnosis not present

## 2021-01-25 DIAGNOSIS — J441 Chronic obstructive pulmonary disease with (acute) exacerbation: Secondary | ICD-10-CM | POA: Diagnosis not present

## 2021-01-25 DIAGNOSIS — E7800 Pure hypercholesterolemia, unspecified: Secondary | ICD-10-CM | POA: Diagnosis not present

## 2021-01-25 DIAGNOSIS — I10 Essential (primary) hypertension: Secondary | ICD-10-CM | POA: Diagnosis not present

## 2021-01-25 DIAGNOSIS — F331 Major depressive disorder, recurrent, moderate: Secondary | ICD-10-CM | POA: Diagnosis not present

## 2021-02-02 NOTE — Progress Notes (Signed)
Patient Care Team: Jonathon Jordan, MD as PCP - General (Family Medicine) Erroll Luna, MD as Consulting Physician (General Surgery) Nicholas Lose, MD as Consulting Physician (Hematology and Oncology) Kyung Rudd, MD as Consulting Physician (Radiation Oncology)  DIAGNOSIS:    ICD-10-CM   1. Malignant neoplasm of upper-outer quadrant of right breast in female, estrogen receptor positive ([place])  C50.411    Z17.0       SUMMARY OF ONCOLOGIC HISTORY: Oncology History  Malignant neoplasm of upper-outer quadrant of right breast in female, estrogen receptor positive ([name])  11/12/2019 Initial Diagnosis   Screening mammogram detected a 1.7cm mass at the 10 o'clock position with calcifications, a 0.9cm mass at the 9:30 position, and no right axillary adenopathy. Biopsy showed no malignancy at the 9:30 position, and at the 10 o'clock position, IDC, grade 3, HER-2 equivocal (2+), ER+ 15% weak, PR- 0%, Ki67 40%.    02/04/2020 Surgery   Bilateral lumpectomies ([facility]) [phone]):  Right breast: IDC, grade 3, 2.2cm, high grade DCIS, involved margins, one right axillary lymph node negative for carcinoma Left breast: high grade DCIS, 1.7cm, involved margins.    03/03/2020 Surgery   Re-excision ([facility]) [phone]): no evidence of malignancy bilaterally   04/11/2020 - 05/06/2020 Radiation Therapy   The patient initially received a dose of 42.56 Gy in 16 fractions to BILATERAL breasts using whole-breast tangent fields. This was delivered using a 3-D conformal technique. The pt received a boost  to BILATERAL breasts delivering an additional 8 Gy in 4 fractions using a electron boost with 16mV electrons. The total dose was 50.56 Gy to each breast.   04/2020 - 04/2027 Anti-estrogen oral therapy   Anastrozole     CHIEF COMPLIANT: Follow-up to discuss antiestrogen therapy  INTERVAL HISTORY: CMAVI UNis a 80y.o. with above-mentioned history of right breast cancer and left breast  DCIS who underwent bilateral lumpectomies followed by re-excision and radiation therapy, currently on anastrozole. Mammogram on 10/18/2020 showed no evidence of malignancy. She presents to the clinic today to for follow-up.  She is tolerating antiestrogen therapy with anastrozole extremely well without any problems or concerns.  Denies any hot flashes or arthralgias or myalgias.  Denies any lumps or nodules in the breast.  ALLERGIES:  is allergic to augmentin [amoxicillin-pot clavulanate], black cohosh, codeine, hyoscyamine, oxybutynin chloride, and minocycline.  MEDICATIONS:  Current Outpatient Medications  Medication Sig Dispense Refill   acetaminophen (TYLENOL) 500 MG tablet Take 2 tablets (1,000 mg total) by mouth every 6 (six) hours as needed. 40 tablet 2   albuterol (VENTOLIN HFA) 108 (90 Base) MCG/ACT inhaler Inhale 2 puffs into the lungs every 6 (six) hours as needed for wheezing or shortness of breath. 8 g 12   anastrozole (ARIMIDEX) 1 MG tablet Take 1 tablet (1 mg total) by mouth daily. 90 tablet 3   azelastine (ASTELIN) 0.1 % nasal spray 1-2 puffs each nostril twice daily if needed (Patient taking differently: Place 1-2 sprays into both nostrils daily as needed for rhinitis.) 30 mL 12   benzonatate (TESSALON) 200 MG capsule Take 1 capsule (200 mg total) by mouth 3 (three) times daily as needed for cough. 30 capsule 1   BETA CAROTENE PO Take 7,500 mcg by mouth daily.     Cholecalciferol (VITAMIN D) 50 MCG (2000 UT) tablet Take 2,000 Units by mouth daily.     citalopram (CELEXA) 20 MG tablet Take 20 mg by mouth daily.     clindamycin (CLEOCIN) 300 MG capsule Take 1 capsule (300  mg total) by mouth 3 (three) times daily. 21 capsule 0   Coenzyme Q10 (COQ-10) 100 MG CAPS Take 100 mg by mouth daily.     Cyanocobalamin (B-12 PO) Take 1,000 mg by mouth daily.     dorzolamide (TRUSOPT) 2 % ophthalmic solution Place 1 drop into both eyes 2 (two) times daily.      ferrous sulfate 325 (65 FE) MG  tablet Take 325 mg by mouth daily with breakfast.     fluticasone (FLONASE) 50 MCG/ACT nasal spray Place 2 sprays into both nostrils daily. (Patient taking differently: Place 2 sprays into both nostrils daily as needed for allergies.) 15 g 2   Fluticasone-Umeclidin-Vilant (TRELEGY ELLIPTA) 100-62.5-25 MCG/INH AEPB Inhale 1 puff into the lungs daily. 14 each 0   Fluticasone-Umeclidin-Vilant (TRELEGY ELLIPTA) 100-62.5-25 MCG/INH AEPB Inhale 1 puff into the lungs daily. 14 each 0   folic acid (FOLVITE) 102 MCG tablet Take 400 mcg by mouth daily.     HYDROcodone-acetaminophen (NORCO/VICODIN) 5-325 MG tablet Take 1 tablet by mouth every 6 (six) hours as needed for moderate pain. 15 tablet 0   Krill Oil 350 MG CAPS Take 350 mg by mouth daily.     latanoprost (XALATAN) 0.005 % ophthalmic solution Place 1 drop into both eyes at bedtime.     levothyroxine (SYNTHROID, LEVOTHROID) 88 MCG tablet Take 88 mcg by mouth daily before breakfast.     Multiple Minerals (CALCIUM/MAGNESIUM/ZINC) TABS Take 3 tablets by mouth at bedtime. 1866m      Multiple Vitamin (MULTIVITAMIN) tablet Take 1 tablet by mouth daily.     Naproxen Sodium 220 MG CAPS Take 440 mg by mouth daily as needed (pain).     NONFORMULARY OR COMPOUNDED ITEM [facility] - Antifungal topical - Terbinafine 3%, Fluconazole 2%, Tea Tree Oil 5%, Urea 10%, Ibuprofen 2%, + Itraconazole 3%, in #341mDMSO suspension. Apply to affected toenail(s) once at bedtime or twice daily. (Patient taking differently: Apply 1 application topically daily as needed (Toe fungus). [facility] Antifungal topical - Terbinafine 3%, Fluconazole 2%, Tea Tree Oil 5%, Urea 10%, Ibuprofen 2%, + Itraconazole 3%, in #3012mMSO suspension. Apply to affected toenail(s) once at bedtime or twice daily.) 30 each 11   ondansetron (ZOFRAN) 4 MG tablet Take 1 tablet (4 mg total) by mouth daily as needed for nausea or vomiting. 30 tablet 1   rosuvastatin (CRESTOR) 5 MG tablet Take 5  mg by mouth at bedtime.     vitamin C (ASCORBIC ACID) 500 MG tablet Take 500 mg by mouth daily.     vitamin E 400 UNIT capsule Take 400 Units by mouth daily.     Current Facility-Administered Medications  Medication Dose Route Frequency Provider Last Rate Last Admin   0.9 %  sodium chloride infusion  500 mL Intravenous Continuous Danis, HenEstill CottaI, MD        PHYSICAL EXAMINATION: ECOG PERFORMANCE STATUS: 1 - Symptomatic but completely ambulatory  Vitals:   02/03/21 0914  BP: (!) 149/59  Pulse: 80  Resp: 16  Temp: (!) 97.5 F (36.4 C)  SpO2: 96%   Filed Weights   02/03/21 0914  Weight: 137 lb 12.8 oz (62.5 kg)    BREAST: No palpable masses or nodules in either right or left breasts. No palpable axillary supraclavicular or infraclavicular adenopathy no breast tenderness or nipple discharge. (exam performed in the presence of a chaperone) Patient is extremely dry skin and scar tissue bilaterally appears to be pulling the nipple superiorly.  LABORATORY DATA:  I have reviewed the data as listed CMP Latest Ref Rng & Units 02/02/2020 12/02/2019 11/18/2019  Glucose 70 - 99 mg/dL 100(H) 77 99  BUN 8 - 23 mg/dL '12 12 14  ' Creatinine 0.44 - 1.00 mg/dL 0.87 0.84 0.87  Sodium 135 - 145 mmol/L 140 139 139  Potassium 3.5 - 5.1 mmol/L 4.0 3.7 3.7  Chloride 98 - 111 mmol/L 104 103 104  CO2 22 - 32 mmol/L '26 29 29  ' Calcium 8.9 - 10.3 mg/dL 9.8 9.6 9.7  Total Protein 6.5 - 8.1 g/dL 7.8 7.6 7.8  Total Bilirubin 0.3 - 1.2 mg/dL 0.7 0.6 0.4  Alkaline Phos 38 - 126 U/L 83 80 99  AST 15 - 41 U/L '30 25 25  ' ALT 0 - 44 U/L '22 17 18    ' Lab Results  Component Value Date   WBC 6.0 02/02/2020   HGB 14.7 02/02/2020   HCT 44.8 02/02/2020   MCV 98.2 02/02/2020   PLT 241 02/02/2020   NEUTROABS 3.2 02/02/2020    ASSESSMENT & PLAN:  Malignant neoplasm of upper-outer quadrant of right breast in female, estrogen receptor positive ([place]) 02/04/2020: ilateral lumpectomies ([facility]):  Right breast:  IDC, grade 3, 2.2cm, high grade DCIS, involved margins (posterior and lateral), one right axillary lymph node negative for carcinoma Left breast: high grade DCIS, 1.7cm, involved margins (inferior).  HER-2 Negative by FISH, ER+ 15% weak, PR- 0%, Ki67 40%   Treatment Plan: Resection of the positive margins on both breasts.( Jan 2022: No residual cancer) 1. Adjuvant radiation therapy started 04/12/20-05/06/20 2. Followed by antiestrogen therapy.  With anastrozole started 05/05/2020   Anastrozole toxicities: Denies any adverse effects to anastrozole therapy. Bone density August 2021: T score of 1.3  Breast cancer surveillance: 1.  Breast exam 02/03/2021: Benign 2. mammogram 10/18/2020: Benign breast density category C  RTC in 1 year for follow-up    No orders of the defined types were placed in this encounter.  The patient has a good understanding of the overall plan. she agrees with it. she will call with any problems that may develop before the next visit here.  Total time spent: 20 mins including face to face time and time spent for planning, charting and coordination of care  Rulon Eisenmenger, MD, MPH 02/03/2021  I, Thana Ates, am acting as scribe for Dr. Nicholas Lose.  I have reviewed the above documentation for accuracy and completeness, and I agree with the above.

## 2021-02-02 NOTE — Assessment & Plan Note (Signed)
02/04/2020:ilateral lumpectomies ([facility]):  Right breast: IDC, grade 3, 2.2cm, high grade DCIS, involved margins(posterior and lateral), one right axillary lymph node negative for carcinoma Left breast: high grade DCIS, 1.7cm, involved margins(inferior).  HER-2Negative by FISH, ER+ 15% weak, PR- 0%, Ki67 40%  Treatment Plan: Resection of the positive margins on both breasts.( Jan 2022: No residual cancer) 1. Adjuvant radiation therapy started 04/12/20-05/06/20 2.Followed by antiestrogen therapy.  With anastrozole started 05/05/2020  Anastrozole toxicities:   Breast cancer surveillance: 1.  Breast exam 02/03/2021: Benign 2. mammogram 10/18/2020: Benign breast density category C  RTC in 1 year for follow-up

## 2021-02-03 ENCOUNTER — Inpatient Hospital Stay: Payer: Medicare HMO | Attending: Hematology and Oncology | Admitting: Hematology and Oncology

## 2021-02-03 ENCOUNTER — Other Ambulatory Visit: Payer: Self-pay

## 2021-02-03 DIAGNOSIS — Z79899 Other long term (current) drug therapy: Secondary | ICD-10-CM | POA: Diagnosis not present

## 2021-02-03 DIAGNOSIS — C50411 Malignant neoplasm of upper-outer quadrant of right female breast: Secondary | ICD-10-CM | POA: Diagnosis not present

## 2021-02-03 DIAGNOSIS — Z170 Estrogen receptor positive status [ER+]: Secondary | ICD-10-CM | POA: Diagnosis not present

## 2021-02-03 DIAGNOSIS — Z880 Allergy status to penicillin: Secondary | ICD-10-CM | POA: Diagnosis not present

## 2021-02-03 DIAGNOSIS — Z885 Allergy status to narcotic agent status: Secondary | ICD-10-CM | POA: Diagnosis not present

## 2021-02-03 DIAGNOSIS — Z881 Allergy status to other antibiotic agents status: Secondary | ICD-10-CM | POA: Insufficient documentation

## 2021-02-03 MED ORDER — ANASTROZOLE 1 MG PO TABS: 1.0000 mg | ORAL_TABLET | Freq: Every day | ORAL | 3 refills | Status: DC

## 2021-02-15 ENCOUNTER — Telehealth: Payer: Self-pay | Admitting: Internal Medicine

## 2021-02-15 DIAGNOSIS — U071 COVID-19: Secondary | ICD-10-CM | POA: Diagnosis not present

## 2021-02-15 DIAGNOSIS — R051 Acute cough: Secondary | ICD-10-CM | POA: Diagnosis not present

## 2021-02-15 DIAGNOSIS — J449 Chronic obstructive pulmonary disease, unspecified: Secondary | ICD-10-CM | POA: Diagnosis not present

## 2021-02-16 MED ORDER — TRELEGY ELLIPTA 100-62.5-25 MCG/ACT IN AEPB: 1.0000 | INHALATION_SPRAY | Freq: Every day | RESPIRATORY_TRACT | 6 refills | Status: DC

## 2021-02-16 NOTE — Telephone Encounter (Signed)
Trelegy sent to preferred pharmacy.  Patient is aware and voiced her understanding. Nothing further needed at this time.

## 2021-02-22 DIAGNOSIS — E7800 Pure hypercholesterolemia, unspecified: Secondary | ICD-10-CM | POA: Diagnosis not present

## 2021-02-22 DIAGNOSIS — I10 Essential (primary) hypertension: Secondary | ICD-10-CM | POA: Diagnosis not present

## 2021-02-22 DIAGNOSIS — E039 Hypothyroidism, unspecified: Secondary | ICD-10-CM | POA: Diagnosis not present

## 2021-02-22 DIAGNOSIS — J449 Chronic obstructive pulmonary disease, unspecified: Secondary | ICD-10-CM | POA: Diagnosis not present

## 2021-02-22 DIAGNOSIS — J471 Bronchiectasis with (acute) exacerbation: Secondary | ICD-10-CM | POA: Diagnosis not present

## 2021-02-22 DIAGNOSIS — J441 Chronic obstructive pulmonary disease with (acute) exacerbation: Secondary | ICD-10-CM | POA: Diagnosis not present

## 2021-02-28 DIAGNOSIS — Z961 Presence of intraocular lens: Secondary | ICD-10-CM | POA: Diagnosis not present

## 2021-02-28 DIAGNOSIS — H52203 Unspecified astigmatism, bilateral: Secondary | ICD-10-CM | POA: Diagnosis not present

## 2021-02-28 DIAGNOSIS — H401131 Primary open-angle glaucoma, bilateral, mild stage: Secondary | ICD-10-CM | POA: Diagnosis not present

## 2021-04-04 ENCOUNTER — Encounter (HOSPITAL_COMMUNITY): Payer: Self-pay

## 2021-04-14 DIAGNOSIS — H524 Presbyopia: Secondary | ICD-10-CM | POA: Diagnosis not present

## 2021-05-22 DIAGNOSIS — N3000 Acute cystitis without hematuria: Secondary | ICD-10-CM | POA: Diagnosis not present

## 2021-05-29 NOTE — Progress Notes (Signed)
? Patient ID: [name], female    DOB: [birth year], 81 y.o.   MRN: [id number] ? ?HPI ? F never smoker with hx bronchitis/bronchiectasis and chronic recurrent hemoptysis, ? + MAIC.. 09/12/12.complicated by sinusitis, Nocardia,  glaucoma, hx sinusistis  ?Rx: 02/10/13- zith 500 mg TIW, EMB 1200 TIW, Rif 300 mg TIW. Ended by ID. Nocardia Rx'd Bactrim x 1 year ?Office spirometry 06/29/2014-moderate obstructive airways disease, FVC 2.17/69%, FEV1 1.39/60%, FEV1/FVC 64%, FEF 25-75 percent 0.75/4 ? ?---------------------------------------------------------------------- ? ? ?05/27/20-  81 year old female never smoker followed for COPD/ chronic bBonchitis/Bronchiectasis/ MAIC, chronic recurrent hemoptysis, Allergic Rhinitis,  Glaucoma, Hypothyroid, Breast Cancer R,&L/ XRT,  ? positive MAIC 83/15/1761 complicated by Nocardia, history sinusitis ?Rx: 02/10/13- zith 500 mg TIW, EMB 1200 TIW, Rif 300 mg TIW. Ended by ID. Nocardia Rx'd Bactrim x 1 year ?-Ventolin hfa, Astelin nasal, Trelegy 100,  ?Covid vax- 3 Phizer ?Flu vax-had ?Onset in last couple of days- nasal congestion, clear drainage, dry cough, no fever. Doesn't remember Trelegy. Started an otc decongestant. ?Just finished XRT for bilateral breast cancers. ?CXR 11/27/19- ?IMPRESSION: ?Biapical scarring with bilateral bronchiectasis. Possible acute ?superimposed airspace opacities in the right hilar region and left ?base, suggest short interval radiographic follow-up. ? ?05/30/21-81 year old female never smoker followed for COPD/ chronic Bonchitis/Bronchiectasis/ MAIC, chronic recurrent hemoptysis, Allergic Rhinitis,  Glaucoma, Hypothyroid, Breast Cancer R,&L/ XRT,  ? positive MAIC 60/73/7106 complicated by Nocardia, history sinusitis ?Rx: 02/10/13- zith 500 mg TIW, EMB 1200 TIW, Rif 300 mg TIW. Ended by ID. Nocardia Rx'd Bactrim x 1 year ?-Ventolin hfa, Astelin nasal, Trelegy 100,  ?Covid vax- 3 Phizer ?Flu vax-had ?-----Patient is feeling good, no concerns ?Recent  bronchitis resolved with antibiotic from primary physician.  Still has a little residual light cough-dry.  Not using any inhalers currently because she does not think she needs them.  Not short of breath.  Denies night sweats or adenopathy. ?CXR 05/27/20-  ?IMPRESSION: ?1. Chronic lung disease with multifocal scarring. There are patchy ?bibasilar opacities. On the left this is similar to October 2021 ?radiograph, but progressed on the right. These findings are ?progressive from May 2020 CT. This is favored to represent ?progression of indolent infection such as mycobacterium avium ?complex. Possibility of superimposed acute infection is also ?considered. ?2. Patient's known pulmonary nodules are not well seen by ?radiograph. ? ? ?Review of Systems-see HPI   + = positive ?Constitutional:   No-   weight loss, night sweats, fevers, chills, fatigue, lassitude. ?HEENT:   No-  headaches, difficulty swallowing, tooth/dental problems, sore throat,  ?     No- sneezing, no-itching, ear ache, +congestion, post nasal drip+,  ?CV:  No-   chest pain, orthopnea, PND, swelling in lower extremities, anasarca, dizziness, palpitations ?Resp: +  shortness of breath with exertion or at rest.   ?          productive cough,  + non-productive cough,  No- recent coughing up of blood.   ?            change in color of mucus.  No- wheezing.   ?Skin: No-   rash or lesions. ?GI:  No-   heartburn, indigestion, abdominal pain, nausea, vomiting,  ?GU:  ?MS:  No-   joint pain or swelling.  Marland Kitchen ?Neuro-     nothing unusual ?Psych:  No- change in mood or affect. No depression or anxiety.  No memory loss. ? ?Objective:  ? Physical Exam ?General- Alert, Oriented, Affect-cheerful, Distress- none acute. Trim. Looks well. ?Skin- rash-none, lesions- none, excoriation-  none ?Lymphadenopathy- none ?Head- atraumatic ?           Eyes- Gross vision intact, PERRLA, conjunctivae clear secretions ?           Ears- Hearing, canals-normal ?           Nose- + stuffy,  no-Septal dev, mucus, polyps, erosion, perforation  ?           Throat- Mallampati II , mucosa clear , drainage- none, tonsils-  atrophic ?Neck- flexible , trachea midline, no stridor , thyroid nl, carotid no bruit ?Chest - symmetrical excursion , unlabored ?          Heart/CV- RRR/ occ extra beat , no murmur , no gallop  , no rub, nl s1 s2 ?                          - JVD- none , edema- none, stasis changes- none, varices- none ?          Lung- + few crackles, unlabored, cough+minimal dry,  wheeze- none, dullness-none, rub- none ?          Chest wall-  ?Abd-  ?Br/ Gen/ Rectal- Not done, not indicated ?Extrem- cyanosis- none, clubbing, none, atrophy- none, strength- nl.  ?Neuro- grossly intact to observation ? ? ?

## 2021-05-30 ENCOUNTER — Ambulatory Visit: Payer: Medicare PPO | Admitting: Internal Medicine

## 2021-05-30 ENCOUNTER — Ambulatory Visit (INDEPENDENT_AMBULATORY_CARE_PROVIDER_SITE_OTHER): Payer: Medicare PPO

## 2021-05-30 ENCOUNTER — Encounter: Payer: Self-pay | Admitting: Internal Medicine

## 2021-05-30 VITALS — BP 140/70 | HR 78 | Temp 97.7°F | Ht 67.0 in | Wt 140.4 lb

## 2021-05-30 DIAGNOSIS — J479 Bronchiectasis, uncomplicated: Secondary | ICD-10-CM

## 2021-05-30 DIAGNOSIS — J441 Chronic obstructive pulmonary disease with (acute) exacerbation: Secondary | ICD-10-CM | POA: Diagnosis not present

## 2021-05-30 IMAGING — DX DG CHEST 2V
2 series · 2 of 2 positions shown · non-contrast
Comparison: [DATE]

CLINICAL DATA: 81-year-old female with bronchiectasis

EXAM:
CHEST - 2 VIEW

[chest pa]
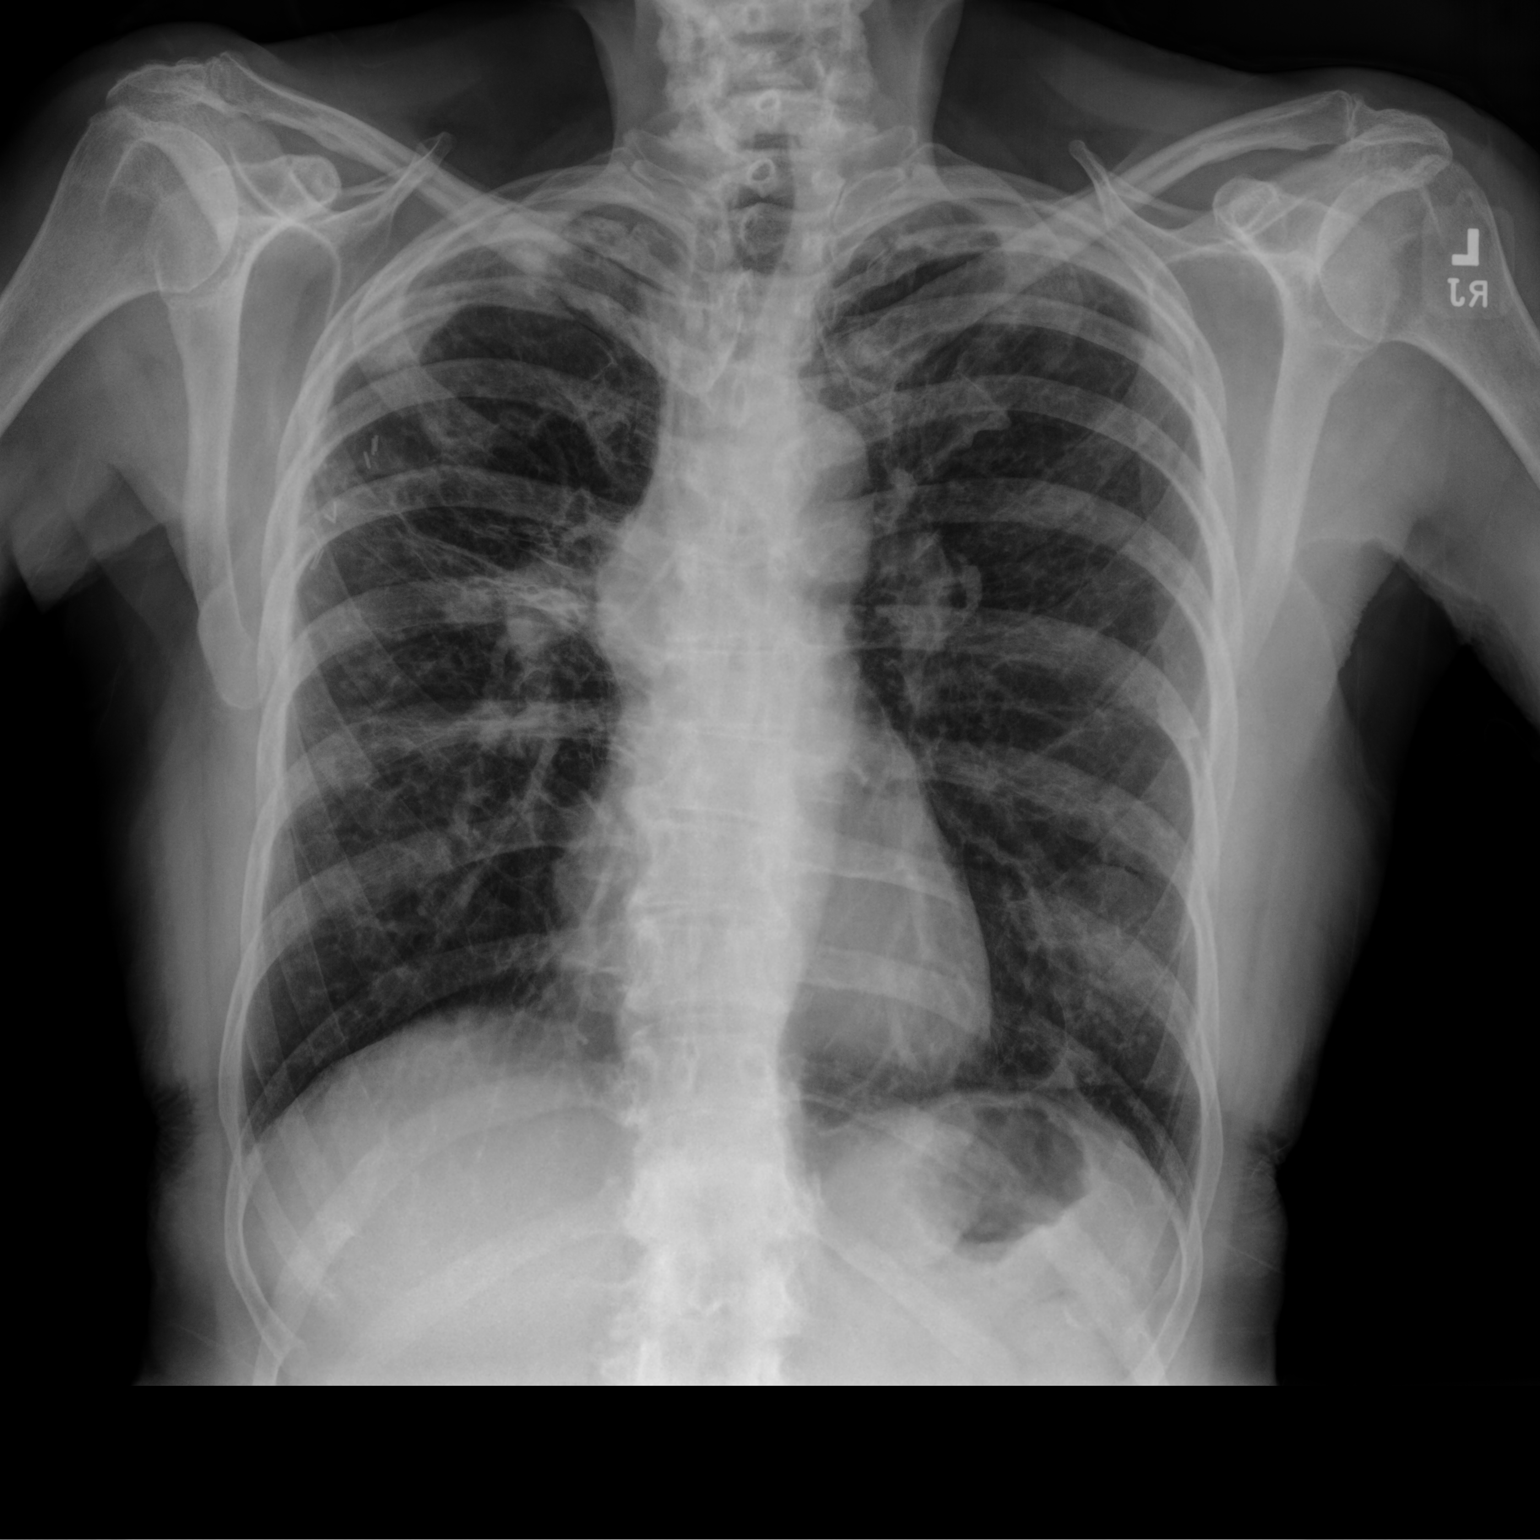

[chest lat]
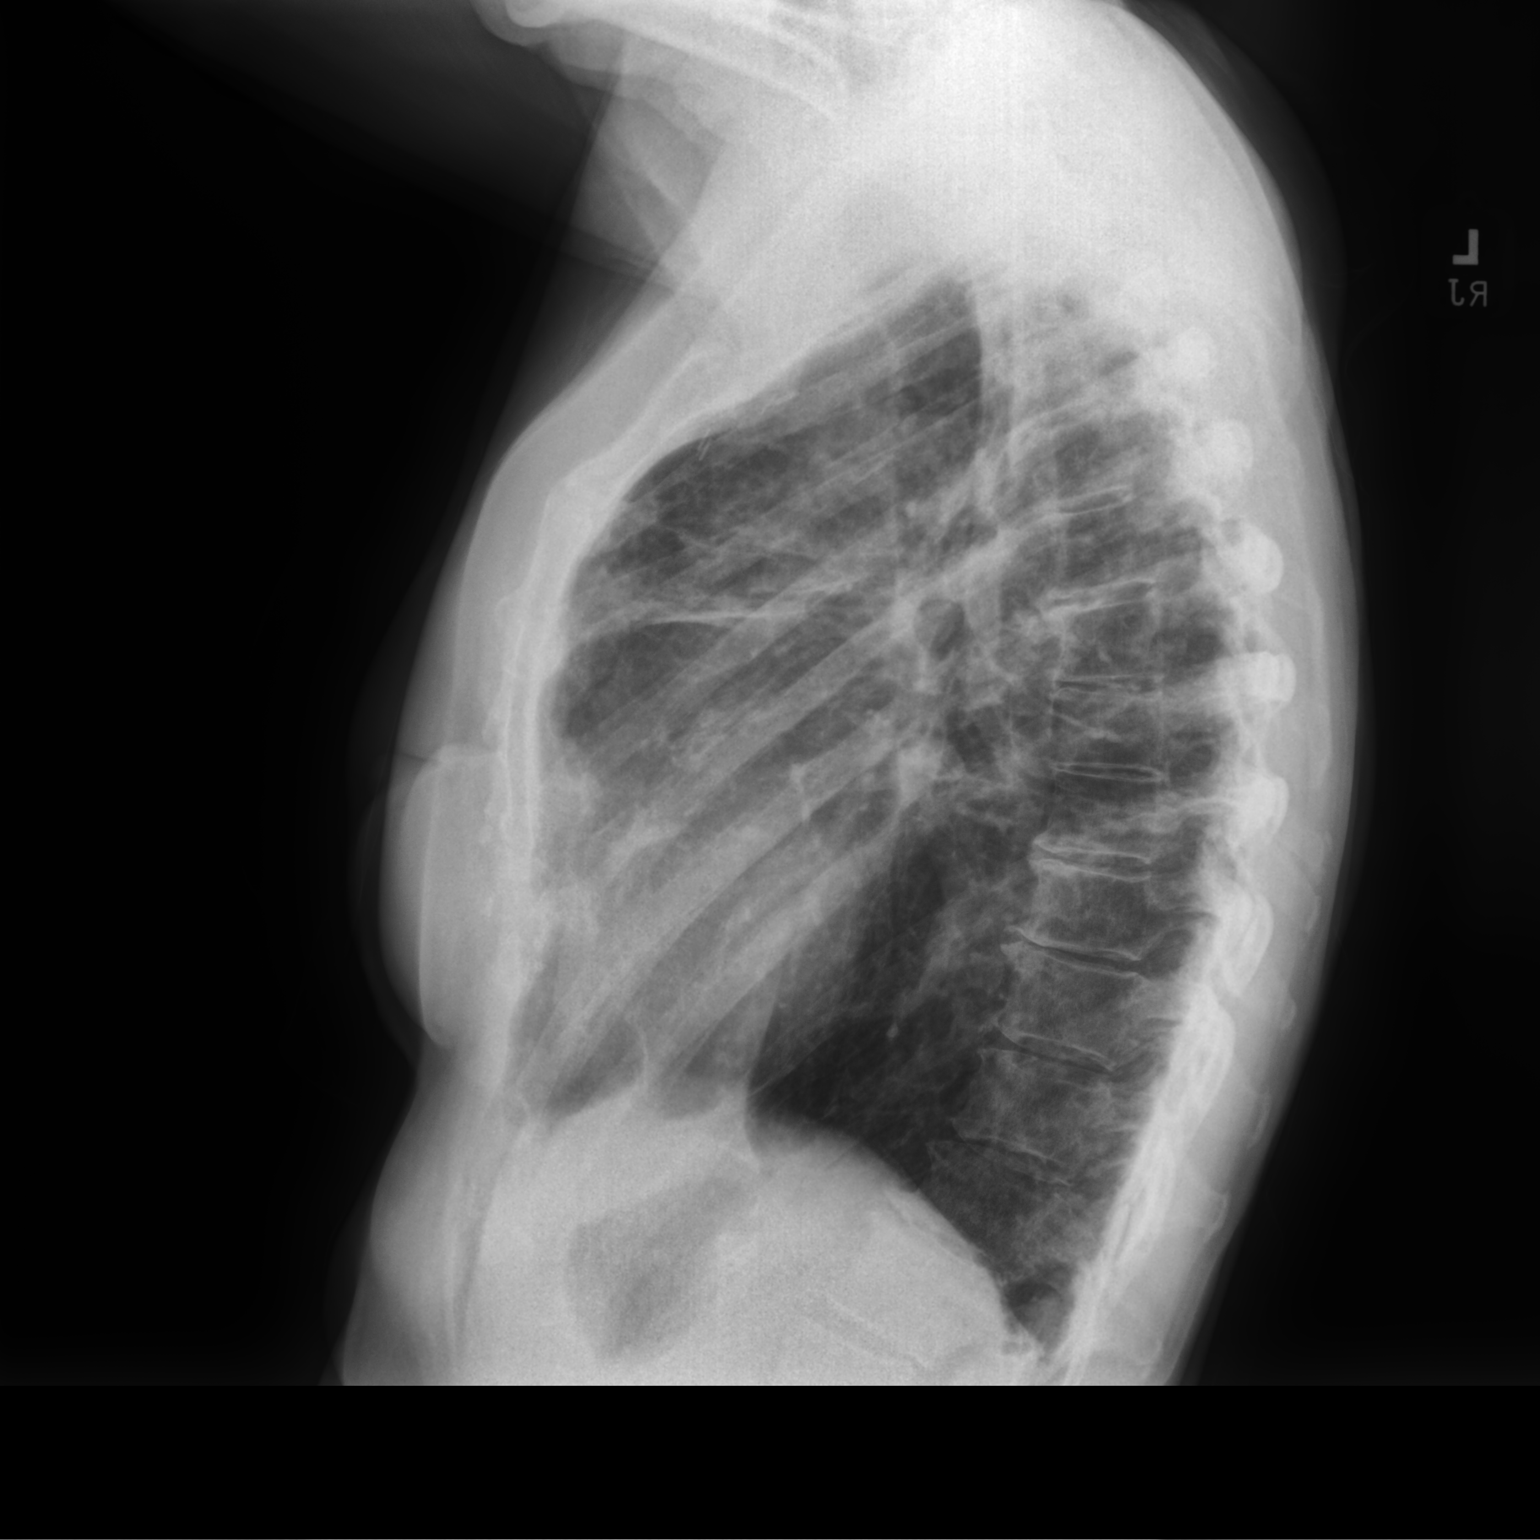

[2 of 2 positions shown; findings below may reference images not displayed]

FINDINGS: Cardiomediastinal silhouette unchanged in size and contour.

Bronchiectasis and bronchial wall thickening.

Improved aeration in the lower lungs compared to the prior plain
film. Pleuroparenchymal thickening at the apices. Surgical changes
of the right chest.

Architectural distortion extending from the hilar regions on the
lateral view.

No pneumothorax or pleural effusion.
IMPRESSION: Improving aeration in the lower lungs compared to the prior plain
film, compatible with resolving infection superimposed on chronic
disease.

## 2021-05-30 NOTE — Patient Instructions (Signed)
Order- CXR   dx Bronchiectasis, MAIC ? ?Ok to watch and see how you feel off the inhalers. ? ?Please call if we can help ?

## 2021-05-30 NOTE — Progress Notes (Signed)
Tried calling the pt and there was no answer and no option to leave msg. Will call back.

## 2021-06-02 ENCOUNTER — Encounter: Payer: Self-pay | Admitting: *Deleted

## 2021-06-02 NOTE — Progress Notes (Signed)
Tried calling the pt and there was no answer- LMTCB and letter sent per protocol.

## 2021-06-04 DIAGNOSIS — A499 Bacterial infection, unspecified: Secondary | ICD-10-CM | POA: Diagnosis not present

## 2021-06-04 DIAGNOSIS — R300 Dysuria: Secondary | ICD-10-CM | POA: Diagnosis not present

## 2021-06-04 DIAGNOSIS — Z6822 Body mass index (BMI) 22.0-22.9, adult: Secondary | ICD-10-CM | POA: Diagnosis not present

## 2021-06-04 DIAGNOSIS — N390 Urinary tract infection, site not specified: Secondary | ICD-10-CM | POA: Diagnosis not present

## 2021-06-04 DIAGNOSIS — R82998 Other abnormal findings in urine: Secondary | ICD-10-CM | POA: Diagnosis not present

## 2021-06-27 ENCOUNTER — Encounter: Payer: Self-pay | Admitting: Internal Medicine

## 2021-06-27 NOTE — Assessment & Plan Note (Signed)
Recent acute bronchitis now back to baseline.  She is not feeling need or benefit with inhalers at this time.  Discussed. ?

## 2021-06-27 NOTE — Assessment & Plan Note (Signed)
Recent nonspecific acute bronchitis resolved with antibiotic and is probably incidental to her long-term course.  She feels well.  We will continue chest x-ray surveillance.  Consider need for bronchoscopy for BAL sample depending on next CXR. ?Plan-CXR ?

## 2021-06-30 DIAGNOSIS — B079 Viral wart, unspecified: Secondary | ICD-10-CM | POA: Diagnosis not present

## 2021-07-13 ENCOUNTER — Telehealth: Payer: Self-pay | Admitting: Internal Medicine

## 2021-07-13 NOTE — Telephone Encounter (Signed)
ATC patient, LMTCB  Deneise Lever, MD  05/30/2021  2:07 PM EDT     CXR- looks better than last time. Stable old scarring.

## 2021-07-29 DIAGNOSIS — B079 Viral wart, unspecified: Secondary | ICD-10-CM | POA: Diagnosis not present

## 2021-08-16 NOTE — Telephone Encounter (Signed)
Attempted to call pt but unable to reach. Left message for pt to return call. Due to multiple attempts trying to reach pt and unable to do so, per protocol encounter will be closed. 

## 2021-08-29 DIAGNOSIS — H401131 Primary open-angle glaucoma, bilateral, mild stage: Secondary | ICD-10-CM | POA: Diagnosis not present

## 2021-09-06 DIAGNOSIS — J449 Chronic obstructive pulmonary disease, unspecified: Secondary | ICD-10-CM | POA: Diagnosis not present

## 2021-09-06 DIAGNOSIS — F322 Major depressive disorder, single episode, severe without psychotic features: Secondary | ICD-10-CM | POA: Diagnosis not present

## 2021-09-06 DIAGNOSIS — I10 Essential (primary) hypertension: Secondary | ICD-10-CM | POA: Diagnosis not present

## 2021-09-06 DIAGNOSIS — E7800 Pure hypercholesterolemia, unspecified: Secondary | ICD-10-CM | POA: Diagnosis not present

## 2021-09-15 ENCOUNTER — Other Ambulatory Visit: Payer: Self-pay | Admitting: Family Medicine

## 2021-09-15 DIAGNOSIS — Z853 Personal history of malignant neoplasm of breast: Secondary | ICD-10-CM

## 2021-10-09 ENCOUNTER — Ambulatory Visit: Payer: Medicare PPO | Admitting: Podiatry

## 2021-10-09 DIAGNOSIS — B351 Tinea unguium: Secondary | ICD-10-CM | POA: Diagnosis not present

## 2021-10-09 DIAGNOSIS — Z79899 Other long term (current) drug therapy: Secondary | ICD-10-CM

## 2021-10-09 NOTE — Patient Instructions (Signed)
You can also start UREA NAIL GEL for the thickening of the nails  Terbinafine Tablets What is this medication? TERBINAFINE (TER bin a feen) treats fungal infections of the nails. It belongs to a group of medications called antifungals. It will not treat infections caused by bacteria or viruses. This medicine may be used for other purposes; ask your health care provider or pharmacist if you have questions. COMMON BRAND NAME(S): Lamisil, Terbinex What should I tell my care team before I take this medication? They need to know if you have any of these conditions: Liver disease An unusual or allergic reaction to terbinafine, other medications, foods, dyes, or preservatives Pregnant or trying to get pregnant Breast-feeding How should I use this medication? Take this medication by mouth with water. Take it as directed on the prescription label at the same time every day. You can take it with or without food. If it upsets your stomach, take it with food. Keep taking it unless your care team tells you to stop. A special MedGuide will be given to you by the pharmacist with each prescription and refill. Be sure to read this information carefully each time. Talk to your care team regarding the use of this medication in children. Special care may be needed. Overdosage: If you think you have taken too much of this medicine contact a poison control center or emergency room at once. NOTE: This medicine is only for you. Do not share this medicine with others. What if I miss a dose? If you miss a dose, take it as soon as you can unless it is more than 4 hours late. If it is more than 4 hours late, skip the missed dose. Take the next dose at the normal time. What may interact with this medication? Do not take this medication with any of the following: Pimozide Thioridazine This medication may also interact with the following: Beta blockers Caffeine Certain medications for mental health  conditions Cimetidine Cyclosporine Medications for fungal infections like fluconazole and ketoconazole Medications for irregular heartbeat like amiodarone, flecainide and propafenone Rifampin Warfarin This list may not describe all possible interactions. Give your health care provider a list of all the medicines, herbs, non-prescription drugs, or dietary supplements you use. Also tell them if you smoke, drink alcohol, or use illegal drugs. Some items may interact with your medicine. What should I watch for while using this medication? Visit your care team for regular checks on your progress. You may need blood work while you are taking this medication. It may be some time before you see the benefit from this medication. This medication may cause serious skin reactions. They can happen weeks to months after starting the medication. Contact your care team right away if you notice fevers or flu-like symptoms with a rash. The rash may be red or purple and then turn into blisters or peeling of the skin. Or, you might notice a red rash with swelling of the face, lips or lymph nodes in your neck or under your arms. This medication can make you more sensitive to the sun. Keep out of the sun, If you cannot avoid being in the sun, wear protective clothing and sunscreen. Do not use sun lamps or tanning beds/booths. What side effects may I notice from receiving this medication? Side effects that you should report to your care team as soon as possible: Allergic reactions--skin rash, itching, hives, swelling of the face, lips, tongue, or throat Change in sense of smell Change in taste Infection--fever,  chills, cough, or sore throat Liver injury--right upper belly pain, loss of appetite, nausea, light-colored stool, dark yellow or brown urine, yellowing skin or eyes, unusual weakness or fatigue Low red blood cell level--unusual weakness or fatigue, dizziness, headache, trouble breathing Lupus-like  syndrome--joint pain, swelling, or stiffness, butterfly-shaped rash on the face, rashes that get worse in the sun, fever, unusual weakness or fatigue Rash, fever, and swollen lymph nodes Redness, blistering, peeling, or loosening of the skin, including inside the mouth Unusual bruising or bleeding Worsening mood, feelings of depression Side effects that usually do not require medical attention (report to your care team if they continue or are bothersome): Diarrhea Gas Headache Nausea Stomach pain Upset stomach This list may not describe all possible side effects. Call your doctor for medical advice about side effects. You may report side effects to FDA at 1-800-FDA-1088. Where should I keep my medication? Keep out of the reach of children and pets. Store between 20 and 25 degrees C (68 and 77 degrees F). Protect from light. Get rid of any unused medication after the expiration date. To get rid of medications that are no longer needed or have expired: Take the medication to a medication take-back program. Check with your pharmacy or law enforcement to find a location. If you cannot return the medication, check the label or package insert to see if the medication should be thrown out in the garbage or flushed down the toilet. If you are not sure, ask your care team. If it is safe to put it in the trash, take the medication out of the container. Mix the medication with cat litter, dirt, coffee grounds, or other unwanted substance. Seal the mixture in a bag or container. Put it in the trash. NOTE: This sheet is a summary. It may not cover all possible information. If you have questions about this medicine, talk to your doctor, pharmacist, or health care provider.  2023 Elsevier/Gold Standard (2020-08-30 00:00:00)

## 2021-10-09 NOTE — Progress Notes (Unsigned)
Subjective: Chief Complaint  Patient presents with   Nail Problem    bilateral foot fungus, great and 2nd toes    81 year old female with the above complaints.  She said that the nails are still thick and discolored.  Previously was on the medication which helped but she would like to go back on the medication of the fungus.  No swelling redness or drainage at the toenail sites.  No open lesions.  No other concerns.  Objective: AAO x3, NAD DP/PT pulses palpable bilaterally, CRT less than 3 seconds Nails are hypertrophic, dystrophic, brittle, discolored, elongated 10. No surrounding redness or drainage. No open lesions or pre-ulcerative lesions are identified today. No pain with calf compression, swelling, warmth, erythema  Assessment: Onychomycosis  Plan: -All treatment options discussed with the patient including all alternatives, risks, complications.  -Discussed her treatment options for nail fungus.  After discussion she wants to do oral Lamisil discussed side effects.  Check a CBC and LFT prior to starting medication. -As a courtesy debride the nails W58 without complications or bleeding. -Patient encouraged to call the office with any questions, concerns, change in symptoms.   Trula Slade DPM

## 2021-10-10 ENCOUNTER — Telehealth: Payer: Self-pay | Admitting: *Deleted

## 2021-10-10 NOTE — Telephone Encounter (Signed)
Patient is calling to request the non formulary topical compound that was ordered on 05/30/21 instead of one that was supposed to be prescribed(Terbinafine? ) at last visit.  Please advise.

## 2021-10-11 DIAGNOSIS — B351 Tinea unguium: Secondary | ICD-10-CM | POA: Insufficient documentation

## 2021-10-11 NOTE — Telephone Encounter (Signed)
Patient notified

## 2021-10-12 ENCOUNTER — Telehealth: Payer: Self-pay | Admitting: Podiatry

## 2021-10-12 NOTE — Telephone Encounter (Signed)
Received call from [facility] about a rx that was to have been faxed that they have not received. Transferred to [facility] as she is the one that faxed it on 8.22

## 2021-10-12 NOTE — Telephone Encounter (Signed)
[name] w/ [facility] is calling to get a verbal of the non formulary prescription w/ 11 refills , did not receive. Gave verbal w/ 11 refills(same as original sent).

## 2021-10-19 ENCOUNTER — Inpatient Hospital Stay: Admission: RE | Admit: 2021-10-19 | Payer: Medicare PPO | Source: Ambulatory Visit

## 2021-10-21 ENCOUNTER — Emergency Department (HOSPITAL_COMMUNITY): Payer: Medicare PPO

## 2021-10-21 ENCOUNTER — Inpatient Hospital Stay (HOSPITAL_COMMUNITY)
Admission: EM | Admit: 2021-10-21 | Discharge: 2021-10-26 | DRG: 246 | Disposition: A | Discharge status: Home / Self Care | Payer: Medicare PPO | Attending: Internal Medicine | Admitting: Internal Medicine

## 2021-10-21 ENCOUNTER — Other Ambulatory Visit: Payer: Self-pay

## 2021-10-21 ENCOUNTER — Encounter (HOSPITAL_COMMUNITY): Payer: Self-pay | Admitting: Emergency Medicine

## 2021-10-21 DIAGNOSIS — S5011XA Contusion of right forearm, initial encounter: Secondary | ICD-10-CM | POA: Diagnosis not present

## 2021-10-21 DIAGNOSIS — Z8249 Family history of ischemic heart disease and other diseases of the circulatory system: Secondary | ICD-10-CM

## 2021-10-21 DIAGNOSIS — Z853 Personal history of malignant neoplasm of breast: Secondary | ICD-10-CM | POA: Diagnosis not present

## 2021-10-21 DIAGNOSIS — I429 Cardiomyopathy, unspecified: Secondary | ICD-10-CM | POA: Diagnosis not present

## 2021-10-21 DIAGNOSIS — Z79899 Other long term (current) drug therapy: Secondary | ICD-10-CM | POA: Diagnosis not present

## 2021-10-21 DIAGNOSIS — Z170 Estrogen receptor positive status [ER+]: Secondary | ICD-10-CM

## 2021-10-21 DIAGNOSIS — N3000 Acute cystitis without hematuria: Secondary | ICD-10-CM | POA: Diagnosis not present

## 2021-10-21 DIAGNOSIS — Z79811 Long term (current) use of aromatase inhibitors: Secondary | ICD-10-CM

## 2021-10-21 DIAGNOSIS — N390 Urinary tract infection, site not specified: Secondary | ICD-10-CM

## 2021-10-21 DIAGNOSIS — S0990XA Unspecified injury of head, initial encounter: Secondary | ICD-10-CM | POA: Diagnosis present

## 2021-10-21 DIAGNOSIS — I10 Essential (primary) hypertension: Secondary | ICD-10-CM | POA: Diagnosis present

## 2021-10-21 DIAGNOSIS — Z9221 Personal history of antineoplastic chemotherapy: Secondary | ICD-10-CM | POA: Diagnosis not present

## 2021-10-21 DIAGNOSIS — Z955 Presence of coronary angioplasty implant and graft: Secondary | ICD-10-CM | POA: Diagnosis not present

## 2021-10-21 DIAGNOSIS — R457 State of emotional shock and stress, unspecified: Secondary | ICD-10-CM | POA: Diagnosis not present

## 2021-10-21 DIAGNOSIS — J479 Bronchiectasis, uncomplicated: Secondary | ICD-10-CM | POA: Diagnosis present

## 2021-10-21 DIAGNOSIS — I447 Left bundle-branch block, unspecified: Secondary | ICD-10-CM | POA: Diagnosis present

## 2021-10-21 DIAGNOSIS — Z825 Family history of asthma and other chronic lower respiratory diseases: Secondary | ICD-10-CM | POA: Diagnosis not present

## 2021-10-21 DIAGNOSIS — Z8619 Personal history of other infectious and parasitic diseases: Secondary | ICD-10-CM

## 2021-10-21 DIAGNOSIS — Z881 Allergy status to other antibiotic agents status: Secondary | ICD-10-CM | POA: Diagnosis not present

## 2021-10-21 DIAGNOSIS — S5001XA Contusion of right elbow, initial encounter: Secondary | ICD-10-CM | POA: Diagnosis not present

## 2021-10-21 DIAGNOSIS — I25110 Atherosclerotic heart disease of native coronary artery with unstable angina pectoris: Secondary | ICD-10-CM | POA: Diagnosis present

## 2021-10-21 DIAGNOSIS — Z79890 Hormone replacement therapy (postmenopausal): Secondary | ICD-10-CM

## 2021-10-21 DIAGNOSIS — W010XXA Fall on same level from slipping, tripping and stumbling without subsequent striking against object, initial encounter: Secondary | ICD-10-CM | POA: Diagnosis not present

## 2021-10-21 DIAGNOSIS — Z7982 Long term (current) use of aspirin: Secondary | ICD-10-CM

## 2021-10-21 DIAGNOSIS — R079 Chest pain, unspecified: Secondary | ICD-10-CM | POA: Diagnosis not present

## 2021-10-21 DIAGNOSIS — Z90710 Acquired absence of both cervix and uterus: Secondary | ICD-10-CM

## 2021-10-21 DIAGNOSIS — R002 Palpitations: Secondary | ICD-10-CM | POA: Diagnosis not present

## 2021-10-21 DIAGNOSIS — Z7902 Long term (current) use of antithrombotics/antiplatelets: Secondary | ICD-10-CM | POA: Diagnosis not present

## 2021-10-21 DIAGNOSIS — I214 Non-ST elevation (NSTEMI) myocardial infarction: Principal | ICD-10-CM

## 2021-10-21 DIAGNOSIS — R300 Dysuria: Secondary | ICD-10-CM | POA: Diagnosis present

## 2021-10-21 DIAGNOSIS — C50411 Malignant neoplasm of upper-outer quadrant of right female breast: Secondary | ICD-10-CM | POA: Diagnosis present

## 2021-10-21 DIAGNOSIS — R54 Age-related physical debility: Secondary | ICD-10-CM | POA: Diagnosis present

## 2021-10-21 DIAGNOSIS — Z885 Allergy status to narcotic agent status: Secondary | ICD-10-CM

## 2021-10-21 DIAGNOSIS — E876 Hypokalemia: Secondary | ICD-10-CM | POA: Diagnosis present

## 2021-10-21 DIAGNOSIS — S0083XA Contusion of other part of head, initial encounter: Secondary | ICD-10-CM | POA: Diagnosis not present

## 2021-10-21 DIAGNOSIS — Z923 Personal history of irradiation: Secondary | ICD-10-CM | POA: Diagnosis not present

## 2021-10-21 DIAGNOSIS — M4802 Spinal stenosis, cervical region: Secondary | ICD-10-CM | POA: Diagnosis not present

## 2021-10-21 DIAGNOSIS — J322 Chronic ethmoidal sinusitis: Secondary | ICD-10-CM | POA: Diagnosis not present

## 2021-10-21 DIAGNOSIS — G9341 Metabolic encephalopathy: Secondary | ICD-10-CM | POA: Diagnosis not present

## 2021-10-21 DIAGNOSIS — E039 Hypothyroidism, unspecified: Secondary | ICD-10-CM | POA: Diagnosis present

## 2021-10-21 DIAGNOSIS — I70298 Other atherosclerosis of native arteries of extremities, other extremity: Secondary | ICD-10-CM | POA: Diagnosis not present

## 2021-10-21 DIAGNOSIS — S59901A Unspecified injury of right elbow, initial encounter: Secondary | ICD-10-CM | POA: Diagnosis not present

## 2021-10-21 DIAGNOSIS — M47812 Spondylosis without myelopathy or radiculopathy, cervical region: Secondary | ICD-10-CM | POA: Diagnosis not present

## 2021-10-21 DIAGNOSIS — Z888 Allergy status to other drugs, medicaments and biological substances status: Secondary | ICD-10-CM

## 2021-10-21 DIAGNOSIS — R0789 Other chest pain: Secondary | ICD-10-CM | POA: Diagnosis not present

## 2021-10-21 DIAGNOSIS — Z803 Family history of malignant neoplasm of breast: Secondary | ICD-10-CM

## 2021-10-21 DIAGNOSIS — S0003XA Contusion of scalp, initial encounter: Secondary | ICD-10-CM | POA: Diagnosis not present

## 2021-10-21 DIAGNOSIS — I2510 Atherosclerotic heart disease of native coronary artery without angina pectoris: Secondary | ICD-10-CM | POA: Diagnosis not present

## 2021-10-21 DIAGNOSIS — E785 Hyperlipidemia, unspecified: Secondary | ICD-10-CM | POA: Diagnosis present

## 2021-10-21 DIAGNOSIS — R000 Tachycardia, unspecified: Secondary | ICD-10-CM | POA: Diagnosis not present

## 2021-10-21 LAB — BASIC METABOLIC PANEL
Anion gap: 7 (ref 5–15)
BUN: 9 mg/dL (ref 8–23)
CO2: 27 mmol/L (ref 22–32)
Calcium: 9.3 mg/dL (ref 8.9–10.3)
Chloride: 107 mmol/L (ref 98–111)
Creatinine, Ser: 0.92 mg/dL (ref 0.44–1.00)
GFR, Estimated: >60 mL/min (ref >60)
Glucose, Bld: 148 mg/dL — ABNORMAL HIGH (ref 70–99)
Potassium: 3.3 mmol/L — ABNORMAL LOW (ref 3.5–5.1)
Sodium: 141 mmol/L (ref 135–145)

## 2021-10-21 LAB — CBC
HCT: 43.1 % (ref 36.0–46.0)
Hemoglobin: 14.2 g/dL (ref 12.0–15.0)
MCH: 32.2 pg (ref 26.0–34.0)
MCHC: 32.9 g/dL (ref 30.0–36.0)
MCV: 97.7 fL (ref 80.0–100.0)
Platelets: 184 10*3/uL (ref 150–400)
RBC: 4.41 MIL/uL (ref 3.87–5.11)
RDW: 13 % (ref 11.5–15.5)
WBC: 5.1 10*3/uL (ref 4.0–10.5)
nRBC: 0 % (ref 0.0–0.2)

## 2021-10-21 LAB — D-DIMER, QUANTITATIVE: D-Dimer, Quant: 0.45 ug/mL-FEU (ref 0.00–0.50)

## 2021-10-21 LAB — TSH: TSH: 3.212 u[IU]/mL (ref 0.350–4.500)

## 2021-10-21 LAB — TROPONIN I (HIGH SENSITIVITY)
Troponin I (High Sensitivity): 43 ng/L — ABNORMAL HIGH (ref <18)
Troponin I (High Sensitivity): 794 ng/L (ref <18)

## 2021-10-21 MED ORDER — ADULT MULTIVITAMIN W/MINERALS CH
1.0000 | ORAL_TABLET | Freq: Every day | ORAL | Status: DC
  Administered 2021-10-22 – 2021-10-26 (×5): 1 via ORAL
  Filled 2021-10-21 (×5): qty 1

## 2021-10-21 MED ORDER — ACETAMINOPHEN 325 MG PO TABS
650.0000 mg | ORAL_TABLET | ORAL | Status: DC | PRN
  Administered 2021-10-22: 650 mg via ORAL
  Filled 2021-10-21: qty 2

## 2021-10-21 MED ORDER — POTASSIUM CHLORIDE CRYS ER 20 MEQ PO TBCR
20.0000 meq | EXTENDED_RELEASE_TABLET | Freq: Once | ORAL | Status: AC
  Administered 2021-10-21: 20 meq via ORAL
  Filled 2021-10-21: qty 1

## 2021-10-21 MED ORDER — ALBUTEROL SULFATE (2.5 MG/3ML) 0.083% IN NEBU
3.0000 mL | INHALATION_SOLUTION | Freq: Four times a day (QID) | RESPIRATORY_TRACT | Status: DC | PRN
Start: 2021-10-21 — End: 2021-10-26

## 2021-10-21 MED ORDER — CALCIUM/MAGNESIUM/ZINC PO TABS
3.0000 | ORAL_TABLET | Freq: Every day | ORAL | Status: DC
Start: 2021-10-21 — End: 2021-10-21

## 2021-10-21 MED ORDER — ANASTROZOLE 1 MG PO TABS
1.0000 mg | ORAL_TABLET | Freq: Every day | ORAL | Status: DC
  Administered 2021-10-22 – 2021-10-26 (×5): 1 mg via ORAL
  Filled 2021-10-21 (×5): qty 1

## 2021-10-21 MED ORDER — VENLAFAXINE HCL ER 37.5 MG PO CP24
37.5000 mg | ORAL_CAPSULE | Freq: Every day | ORAL | Status: DC
  Administered 2021-10-22 – 2021-10-26 (×5): 37.5 mg via ORAL
  Filled 2021-10-21 (×7): qty 1

## 2021-10-21 MED ORDER — HEPARIN (PORCINE) 25000 UT/250ML-% IV SOLN
800.0000 [IU]/h | INTRAVENOUS | Status: DC
  Administered 2021-10-21: 750 [IU]/h via INTRAVENOUS
  Administered 2021-10-23: 800 [IU]/h via INTRAVENOUS
  Filled 2021-10-21 (×3): qty 250

## 2021-10-21 MED ORDER — FERROUS SULFATE 325 (65 FE) MG PO TABS
325.0000 mg | ORAL_TABLET | Freq: Every day | ORAL | Status: DC
  Administered 2021-10-22 – 2021-10-26 (×5): 325 mg via ORAL
  Filled 2021-10-21 (×5): qty 1

## 2021-10-21 MED ORDER — FLUTICASONE FUROATE-VILANTEROL 100-25 MCG/ACT IN AEPB
1.0000 | INHALATION_SPRAY | Freq: Every day | RESPIRATORY_TRACT | Status: DC
  Administered 2021-10-22 – 2021-10-26 (×5): 1 via RESPIRATORY_TRACT
  Filled 2021-10-21: qty 28

## 2021-10-21 MED ORDER — HEPARIN BOLUS VIA INFUSION
3000.0000 [IU] | Freq: Once | INTRAVENOUS | Status: AC
  Administered 2021-10-21: 3000 [IU] via INTRAVENOUS
  Filled 2021-10-21: qty 3000

## 2021-10-21 MED ORDER — UMECLIDINIUM BROMIDE 62.5 MCG/ACT IN AEPB
1.0000 | INHALATION_SPRAY | Freq: Every day | RESPIRATORY_TRACT | Status: DC
  Administered 2021-10-22 – 2021-10-26 (×5): 1 via RESPIRATORY_TRACT
  Filled 2021-10-21: qty 7

## 2021-10-21 MED ORDER — ONDANSETRON HCL 4 MG/2ML IJ SOLN: 4.0000 mg | Freq: Four times a day (QID) | INTRAMUSCULAR | Status: DC | PRN

## 2021-10-21 MED ORDER — LATANOPROST 0.005 % OP SOLN
1.0000 [drp] | Freq: Every day | OPHTHALMIC | Status: DC
  Administered 2021-10-21 – 2021-10-25 (×4): 1 [drp] via OPHTHALMIC
  Filled 2021-10-21 (×2): qty 2.5

## 2021-10-21 MED ORDER — LEVOTHYROXINE SODIUM 75 MCG PO TABS
75.0000 ug | ORAL_TABLET | Freq: Every day | ORAL | Status: DC
  Administered 2021-10-22 – 2021-10-26 (×5): 75 ug via ORAL
  Filled 2021-10-21 (×5): qty 1

## 2021-10-21 MED ORDER — ENOXAPARIN SODIUM 40 MG/0.4ML IJ SOSY: 40.0000 mg | PREFILLED_SYRINGE | INTRAMUSCULAR | Status: DC

## 2021-10-21 NOTE — Assessment & Plan Note (Signed)
Listed in chart but pt denies history of. Not on any meds SBP 130s today

## 2021-10-21 NOTE — Assessment & Plan Note (Addendum)
Complex pulm history, [facility] followed by [facility] -> COPD. But no new cough, SOB.  CXR today doesn't show any new infiltrates, etc. 1. Cont home inhalers

## 2021-10-21 NOTE — Assessment & Plan Note (Deleted)
Cont home BP meds after med rec completed.

## 2021-10-21 NOTE — ED Provider Notes (Addendum)
[facility] EMERGENCY DEPARTMENT Provider Note   CSN: [id number] Arrival date & time: 10/21/21  1740     History  Chief Complaint  Patient presents with   Chest Pain    [name] is a 81 y.o. female who presents via EMS with chest pain.  Patient had onset of racing heart palpitations and right-sided chest pain starting about 45 minutes prior to arrival.  She states it lasted approximately 15 minutes.  She has a history of hypothyroidism but denies hypercholesterolemia, hypertension, smoking history.  She states that both parents had heart attacks in their 64s and 52s.  She had a similar episode earlier this week lasting about 15 minutes and had 2 baby aspirin's at the resolved her pain.  She did not have any diaphoresis, nausea, vomiting, shortness of breath.  The pain did not radiate.  She states that she took a full dose aspirin prior to EMS arrival she was given sublingual nitroglycerin with resolution of her pain and some nasal oxygen.  Initial vital signs per EMS were 160/110 blood pressure heart rate in the 130s.  Very EKG showed left bundle branch block with sinus tachycardia.  Patient has no active chest pain at this time   Chest Pain      Home Medications Prior to Admission medications   Medication Sig Start Date End Date Taking? Authorizing Provider  albuterol (VENTOLIN HFA) 108 (90 Base) MCG/ACT inhaler Inhale 2 puffs into the lungs every 6 (six) hours as needed for wheezing or shortness of breath. 11/27/18   Deneise Lever, MD  anastrozole (ARIMIDEX) 1 MG tablet Take 1 tablet (1 mg total) by mouth daily. 02/03/21   Nicholas Lose, MD  azelastine (ASTELIN) 0.1 % nasal spray 1-2 puffs each nostril twice daily if needed Patient taking differently: Place 1-2 sprays into both nostrils daily as needed for rhinitis. 11/26/17   Deneise Lever, MD  benzonatate (TESSALON) 200 MG capsule Take 1 capsule (200 mg total) by mouth 3 (three) times daily as needed  for cough. 05/27/20   Deneise Lever, MD  BETA CAROTENE PO Take 7,500 mcg by mouth daily.    [provider]  Cholecalciferol (VITAMIN D) 50 MCG (2000 UT) tablet Take 2,000 Units by mouth daily.    [provider]  citalopram (CELEXA) 20 MG tablet Take 20 mg by mouth daily.    [provider]  clindamycin (CLEOCIN) 300 MG capsule Take 1 capsule (300 mg total) by mouth 3 (three) times daily. 03/03/20   Cornett, [name], MD  Coenzyme Q10 (COQ-10) 100 MG CAPS Take 100 mg by mouth daily.    [provider]  Cyanocobalamin (B-12 PO) Take 1,000 mg by mouth daily.    [provider]  dorzolamide (TRUSOPT) 2 % ophthalmic solution Place 1 drop into both eyes 2 (two) times daily.  08/25/18   [provider]  ferrous sulfate 325 (65 FE) MG tablet Take 325 mg by mouth daily with breakfast.    [provider]  fluticasone (FLONASE) 50 MCG/ACT nasal spray Place 2 sprays into both nostrils daily. Patient taking differently: Place 2 sprays into both nostrils daily as needed for allergies. 07/29/13   Campbell Riches, MD  Fluticasone-Umeclidin-Vilant (TRELEGY ELLIPTA) 100-62.5-25 MCG/ACT AEPB Inhale 1 puff into the lungs daily. 02/16/21   Baird Lyons D, MD  Fluticasone-Umeclidin-Vilant (TRELEGY ELLIPTA) 100-62.5-25 MCG/INH AEPB Inhale 1 puff into the lungs daily. 11/27/19   Deneise Lever, MD  Fluticasone-Umeclidin-Vilant (TRELEGY ELLIPTA)  100-62.5-25 MCG/INH AEPB Inhale 1 puff into the lungs daily. 05/27/20   Deneise Lever, MD  folic acid (FOLVITE) 767 MCG tablet Take 400 mcg by mouth daily.    [provider]  HYDROcodone-acetaminophen (NORCO/VICODIN) 5-325 MG tablet Take 1 tablet by mouth every 6 (six) hours as needed for moderate pain. 03/03/20   Erroll Luna, MD  Krill Oil 350 MG CAPS Take 350 mg by mouth daily.    [provider]  latanoprost (XALATAN) 0.005 % ophthalmic solution Place 1 drop into both eyes at bedtime.     [provider]  levothyroxine (SYNTHROID, LEVOTHROID) 88 MCG tablet Take 88 mcg by mouth daily before breakfast.    [provider]  Multiple Minerals (CALCIUM/MAGNESIUM/ZINC) TABS Take 3 tablets by mouth at bedtime. '1800mg'$      [provider]  Multiple Vitamin (MULTIVITAMIN) tablet Take 1 tablet by mouth daily.    [provider]  Naproxen Sodium 220 MG CAPS Take 440 mg by mouth daily as needed (pain).    [provider]  NONFORMULARY OR COMPOUNDED ITEM [facility] - Antifungal topical - Terbinafine 3%, Fluconazole 2%, Tea Tree Oil 5%, Urea 10%, Ibuprofen 2%, + Itraconazole 3%, in #44m DMSO suspension. Apply to affected toenail(s) once at bedtime or twice daily. Patient taking differently: Apply 1 application. topically daily as needed (Toe fungus). [facility]- Antifungal topical - Terbinafine 3%, Fluconazole 2%, Tea Tree Oil 5%, Urea 10%, Ibuprofen 2%, + Itraconazole 3%, in #31mDMSO suspension. Apply to affected toenail(s) once at bedtime or twice daily. 10/28/18   WaTrula SladeDPM  rosuvastatin (CRESTOR) 5 MG tablet Take 5 mg by mouth at bedtime. 05/29/19   [provider]  vitamin C (ASCORBIC ACID) 500 MG tablet Take 500 mg by mouth daily.    [provider]  vitamin E 400 UNIT capsule Take 400 Units by mouth daily.    [provider]      Allergies    Augmentin [amoxicillin-pot clavulanate], Black cohosh, Codeine, Hyoscyamine, Oxybutynin chloride, and Minocycline    Review of Systems   Review of Systems  Cardiovascular:  Positive for chest pain.    Physical Exam Updated Vital Signs BP 134/79   Pulse (!) 104   Temp 97.8 F (36.6 C) (Oral)   Resp 15   Ht '5\' 7"'$  (1.702 m)   Wt 63.5 kg   SpO2 93%   BMI 21.93 kg/m  Physical Exam Vitals and nursing note reviewed.  Constitutional:      General: She is not in acute distress.    Appearance: She is well-developed. She is not diaphoretic.   HENT:     Head: Normocephalic and atraumatic.     Right Ear: External ear normal.     Left Ear: External ear normal.     Nose: Nose normal.     Mouth/Throat:     Mouth: Mucous membranes are moist.  Eyes:     General: No scleral icterus.    Conjunctiva/sclera: Conjunctivae normal.  Cardiovascular:     Rate and Rhythm: Regular rhythm. Tachycardia present.     Heart sounds: Normal heart sounds. No murmur heard.    No friction rub. No gallop.  Pulmonary:     Effort: Pulmonary effort is normal. No respiratory distress.     Breath sounds: Normal breath sounds.  Abdominal:     General: Bowel sounds are normal. There is no distension.     Palpations: Abdomen is soft. There is no mass.  Tenderness: There is no abdominal tenderness. There is no guarding.  Musculoskeletal:     Cervical back: Normal range of motion.  Skin:    General: Skin is warm and dry.  Neurological:     Mental Status: She is alert and oriented to person, place, and time.  Psychiatric:        Behavior: Behavior normal.     ED Results / Procedures / Treatments   Labs (all labs ordered are listed, but only abnormal results are displayed) Labs Reviewed  BASIC METABOLIC PANEL - Abnormal; Notable for the following components:      Result Value   Potassium 3.3 (*)    Glucose, Bld 148 (*)    All other components within normal limits  TROPONIN I (HIGH SENSITIVITY) - Abnormal; Notable for the following components:   Troponin I (High Sensitivity) 43 (*)    All other components within normal limits  CBC  TSH  D-DIMER, QUANTITATIVE (NOT AT [facility])  TROPONIN I (HIGH SENSITIVITY)    EKG EKG Interpretation  Date/Time:  Saturday October 21 2021 17:52:12 EDT Ventricular Rate:  112 PR Interval:  140 QRS Duration: 132 QT Interval:  410 QTC Calculation: 560 R Axis:   23 Text Interpretation: Sinus or ectopic atrial tachycardia Left bundle branch block Confirmed by Margaretmary Eddy [phone]) on 10/21/2021 7:28:18  PM  Radiology DG Chest 2 View  Result Date: 10/21/2021 CLINICAL DATA:  Right chest pain, palpitations EXAM: CHEST - 2 VIEW COMPARISON:  Previous studies including the examination of 05/30/2021 FINDINGS: Cardiac size is within normal limits. There are no signs of pulmonary edema or focal pulmonary consolidation. Linear densities in right parahilar region appears stable suggesting scarring. Small linear density in left lower lung field near the diaphragm has not changed. Small blebs are seen in both apices with no change. IMPRESSION: There are no new infiltrates or signs of pulmonary edema. Electronically Signed   By: Elmer Picker M.D.   On: 10/21/2021 18:35    Procedures Procedures    Medications Ordered in ED Medications - No data to display  ED Course/ Medical Decision Making/ A&P Clinical Course as of 10/21/21 1932  Sat Oct 21, 2021  1843 DG Chest 2 View [AH]  1927 Troponin I (High Sensitivity)(!): 43 [AH]    Clinical Course User Index [AH] Margarita Mail, PA-C                           Medical Decision Making 81 year old female who presents emergency department with chief complaint of chest pain. The emergent differential diagnosis of chest pain includes: Acute coronary syndrome, pericarditis, aortic dissection, pulmonary embolism, tension pneumothorax, pneumonia, and esophageal rupture.  Her heart score is moderate risk.  She has a mildly elevated troponin and is only about an hour out from onset of her chest pain.  She is 34 and has not had any cardiac work-up.  Given these facts and according to her personal algorithm patient will need hobs admission.  I did discuss the case with Dr. Rudi Rummage, cardiology fellow on-call who does not recommend CT coronary study.  He does recommend hobs admission and cardiology consult should her troponins continue to elevate.  Case discussed with Dr. Alcario Drought who will admit the patient for the hospitalist service.  I have discussed all  findings with the patient she is in agreement with admission.  Amount and/or Complexity of Data Reviewed Independent Historian:     Details: At child at bedside  Labs: ordered. Decision-making details documented in ED Course.    Details: Patient's labs reviewed.  Mild hypokalemia, CBC within normal limits, initial troponin elevated at 43 Radiology: ordered and independent interpretation performed. Decision-making details documented in ED Course.    Details: Independently interpreted 2 view chest x-ray which shows no abnormalities Discussion of management or test interpretation with external provider(s): EKG shows sinus tachycardia left bundle branch block.  Risk Decision regarding hospitalization.           Final Clinical Impression(s) / ED Diagnoses Final diagnoses:  Moderate risk chest pain    Rx / DC Orders ED Discharge Orders     None         Margarita Mail, PA-C 10/22/21 0029    Fransico Meadow, MD 10/22/21 [place], [place], PA-C 10/25/21 4540    Fransico Meadow, MD 10/26/21 1135

## 2021-10-21 NOTE — Progress Notes (Signed)
ANTICOAGULATION CONSULT NOTE - Initial Consult  Pharmacy Consult for heparin Indication: chest pain/ACS  Allergies  Allergen Reactions   Augmentin [Amoxicillin-Pot Clavulanate] Nausea And Vomiting    .Marland KitchenHas patient had a PCN reaction causing immediate rash, facial/tongue/throat swelling, SOB or lightheadedness with hypotension: No Has patient had a PCN reaction causing severe rash involving mucus membranes or skin necrosis: No Has patient had a PCN reaction that required hospitalization No Has patient had a PCN reaction occurring within the last 10 years: No If all of the above answers are "NO", then may proceed with Cephalosporin use.    Black Cohosh Nausea And Vomiting    Severe GI Upset, sweats   Codeine Nausea And Vomiting    Severe GI upset, sweats   Hyoscyamine Other (See Comments)    Cramps, made urinary symptoms worse   Oxybutynin Chloride Other (See Comments)    Cramps, made urinary symptoms worse   Minocycline Other (See Comments)    Scratchy tongue, swollen lips    Patient Measurements: Height: '5\' 8"'$  (172.7 cm) Weight: 64.1 kg (141 lb 5 oz) IBW/kg (Calculated) : 63.9  Vital Signs: Temp: 97.7 F (36.5 C) (09/02 2213) Temp Source: Oral (09/02 2213) BP: 145/78 (09/02 2213) Pulse Rate: 78 (09/02 2213)  Labs: Recent Labs    10/21/21 1747 10/21/21 2054  HGB 14.2  --   HCT 43.1  --   PLT 184  --   CREATININE 0.92  --   TROPONINIHS 43* 794*    Estimated Creatinine Clearance: 47.6 mL/min (by C-G formula based on SCr of 0.92 mg/dL).   Medical History: Past Medical History:  Diagnosis Date   Allergy    Anemia    Breast cancer ([place])    Cataract    COPD (chronic obstructive pulmonary disease) ([place])    Depression    Essential hypertension 11/29/2014   Glaucoma    Hemoptysis    Hypothyroidism    Left bundle branch block 03/16/2005   Oxygen deficiency    patient was using oxygen at home, her pulmonologist discontinued it and patient stopped using it on  Monday Mar 20, 2015   Personal history of chemotherapy    Personal history of radiation therapy    Restless leg syndrome    Sinusitis    Vertigo     Medications:  Facility-Administered Medications Prior to Admission  Medication Dose Route Frequency Provider Last Rate Last Admin   [DISCONTINUED] 0.9 %  sodium chloride infusion  500 mL Intravenous Continuous Danis, Estill Cotta III, MD       Medications Prior to Admission  Medication Sig Dispense Refill Last Dose   albuterol (VENTOLIN HFA) 108 (90 Base) MCG/ACT inhaler Inhale 2 puffs into the lungs every 6 (six) hours as needed for wheezing or shortness of breath. 8 g 12 unknown   anastrozole (ARIMIDEX) 1 MG tablet Take 1 tablet (1 mg total) by mouth daily. 90 tablet 3 10/20/2021   azelastine (ASTELIN) 0.1 % nasal spray 1-2 puffs each nostril twice daily if needed (Patient taking differently: Place 1-2 sprays into both nostrils daily as needed for rhinitis.) 30 mL 12 unknown   benzonatate (TESSALON) 200 MG capsule Take 1 capsule (200 mg total) by mouth 3 (three) times daily as needed for cough. 30 capsule 1 unknown   Cholecalciferol (VITAMIN D) 50 MCG (2000 UT) tablet Take 2,000 Units by mouth daily.   10/20/2021   Coenzyme Q10 (COQ-10) 100 MG CAPS Take 100 mg by mouth daily.   10/20/2021  EUTHYROX 75 MCG tablet Take 75 mcg by mouth every morning.   10/21/2021   ferrous sulfate 325 (65 FE) MG tablet Take 325 mg by mouth daily with breakfast.   10/20/2021   fluticasone (FLONASE) 50 MCG/ACT nasal spray Place 2 sprays into both nostrils daily. (Patient taking differently: Place 2 sprays into both nostrils daily as needed for allergies.) 15 g 2 unknown   Fluticasone-Umeclidin-Vilant (TRELEGY ELLIPTA) 100-62.5-25 MCG/ACT AEPB Inhale 1 puff into the lungs daily. (Patient taking differently: Inhale 1 puff into the lungs daily as needed (sob/wheezing).) 60 each 6 unknown   folic acid (FOLVITE) 409 MCG tablet Take 400 mcg by mouth daily.   10/20/2021   Krill Oil 350  MG CAPS Take 350 mg by mouth daily.   10/20/2021   latanoprost (XALATAN) 0.005 % ophthalmic solution Place 1 drop into both eyes at bedtime.   10/20/2021   levothyroxine (EUTHYROX) 75 MCG tablet Take 75 mcg by mouth daily before breakfast.   10/21/2021   Multiple Minerals (CALCIUM/MAGNESIUM/ZINC) TABS Take 3 tablets by mouth at bedtime.   10/20/2021   Multiple Vitamin (MULTIVITAMIN) tablet Take 1 tablet by mouth daily.   10/20/2021   Naproxen Sodium 220 MG CAPS Take 440 mg by mouth daily as needed (pain).   unknown   NONFORMULARY OR COMPOUNDED ITEM [facility] - Antifungal topical - Terbinafine 3%, Fluconazole 2%, Tea Tree Oil 5%, Urea 10%, Ibuprofen 2%, + Itraconazole 3%, in #23m DMSO suspension. Apply to affected toenail(s) once at bedtime or twice daily. (Patient taking differently: Apply 1 application  topically daily as needed (Toe fungus). [facility]- Antifungal topical - Terbinafine 3%, Fluconazole 2%, Tea Tree Oil 5%, Urea 10%, Ibuprofen 2%, + Itraconazole 3%, in #314mDMSO suspension. Apply to affected toenail(s) once at bedtime or twice daily.) 30 each 11 unknown   pyridOXINE (VITAMIN B6) 100 MG tablet Take 100 mg by mouth daily.   10/20/2021   venlafaxine XR (EFFEXOR-XR) 37.5 MG 24 hr capsule Take 37.5 mg by mouth daily.   10/20/2021   vitamin C (ASCORBIC ACID) 500 MG tablet Take 500 mg by mouth daily.   10/20/2021   vitamin E 400 UNIT capsule Take 400 Units by mouth daily.   10/20/2021   Scheduled:   [START ON 10/22/2021] anastrozole  1 mg Oral Daily   [START ON 10/22/2021] ferrous sulfate  325 mg Oral Q breakfast   [START ON 10/22/2021] fluticasone furoate-vilanterol  1 puff Inhalation Daily   And   [START ON 10/22/2021] umeclidinium bromide  1 puff Inhalation Daily   latanoprost  1 drop Both Eyes QHS   [START ON 10/22/2021] levothyroxine  75 mcg Oral Q0600   [START ON 10/22/2021] multivitamin with minerals  1 tablet Oral Daily   potassium chloride  20 mEq Oral Once   venlafaxine XR  37.5 mg  Oral Daily    Assessment: [age]yoemale c/o non-radiating right-sided CP/palpitations, pain subsided after NTG x1, initial troponin mildly elevated and now rising, to begin heparin.  Goal of Therapy:  Heparin level 0.3-0.7 units/ml Monitor platelets by anticoagulation protocol: Yes   Plan:  Heparin 3000 units IV bolus x1 followed by infusion at 750 units/hr. Monitor heparin levels and CBC.  VeWynona NeatPharmD, BCPS  10/21/2021,10:42 PM

## 2021-10-21 NOTE — Progress Notes (Signed)
Pt remains asymptomatic and CP free at this time.  Second trop however has now increased to 794 (from 43 on first trop).  Heparin gtt Sending message to cards about need for formal consult, in AM if nothing else Suspect she may need LHC. DDx includes demand ischemia, did have initial HR 130s with EMS  Not sure why HR was 130s initially though. Sister has chronic A.Fib but pt has no h/o A.Fib No report of A.Fib despite initial HR 130s S.Tach on initial EKG in ED

## 2021-10-21 NOTE — Assessment & Plan Note (Deleted)
Continue home O2 while asleep and PRN.

## 2021-10-21 NOTE — ED Notes (Signed)
ED TO INPATIENT HANDOFF REPORT  ED Nurse Name and Phone #: Iona Coach Name/Age/Gender [name] 81 y.o. female Room/Bed: 034C/034C  Code Status   Code Status: Full Code  Home/SNF/Other Home Patient oriented to: self, place, time, and situation Is this baseline? Yes   Triage Complete: Triage complete  Chief Complaint Chest pain, rule out acute myocardial infarction [R07.9]  Triage Note Pt bib gcems from home for non radiating R sided chest pain and palpitations starting this morning. Denies sob, n/v, abdominal pain. 324 aspirin and 1 nitro given PTA. Pain subsided after nitro.   BP 160/110 HR 130, spo2 100%   Allergies Allergies  Allergen Reactions   Augmentin [Amoxicillin-Pot Clavulanate] Nausea And Vomiting    .Marland KitchenHas patient had a PCN reaction causing immediate rash, facial/tongue/throat swelling, SOB or lightheadedness with hypotension: No Has patient had a PCN reaction causing severe rash involving mucus membranes or skin necrosis: No Has patient had a PCN reaction that required hospitalization No Has patient had a PCN reaction occurring within the last 10 years: No If all of the above answers are "NO", then may proceed with Cephalosporin use.    Black Cohosh Nausea And Vomiting    Severe GI Upset, sweats   Codeine Nausea And Vomiting    Severe GI upset, sweats   Hyoscyamine Other (See Comments)    Cramps, made urinary symptoms worse   Oxybutynin Chloride Other (See Comments)    Cramps, made urinary symptoms worse   Minocycline Other (See Comments)    Scratchy tongue, swollen lips    Level of Care/Admitting Diagnosis ED Disposition     ED Disposition  Admit   Condition  --   [place]: [place] [[id number]]  Level of Care: Telemetry Cardiac [[id number]]  May place patient in observation at [facility] or [place] if equivalent level of care is available:: No  Covid Evaluation: Asymptomatic - no recent exposure (last 10  days) testing not required  Diagnosis: Chest pain, rule out acute myocardial infarction [[id number]]  Admitting Physician: Etta Quill [[id number]]  Attending Physician: Etta Quill [[id number]]          B Medical/Surgery History Past Medical History:  Diagnosis Date   Allergy    Anemia    Breast cancer ([place])    Cataract    COPD (chronic obstructive pulmonary disease) ([place])    Depression    Essential hypertension 11/29/2014   Glaucoma    Hemoptysis    Hypothyroidism    Left bundle branch block 03/16/2005   Oxygen deficiency    patient was using oxygen at home, her pulmonologist discontinued it and patient stopped using it on Monday Mar 20, 2015   Personal history of chemotherapy    Personal history of radiation therapy    Restless leg syndrome    Sinusitis    Vertigo    Past Surgical History:  Procedure Laterality Date   ABDOMINAL HYSTERECTOMY     APPENDECTOMY  2009   BREAST CYST ASPIRATION Right 06/12/2016   BREAST LUMPECTOMY WITH RADIOACTIVE SEED AND SENTINEL LYMPH NODE BIOPSY Bilateral 02/04/2020   Procedure: BILATERAL BREAST LUMPECTOMY WITH RADIOACTIVE SEED , RIGHT X 2, LEFT X 1 AND RIGHT SENTINEL LYMPH NODE MAPPING;  Surgeon: Erroll Luna, MD;  Location: [place];  Service: General;  Laterality: Bilateral;   RE-EXCISION OF BREAST LUMPECTOMY Bilateral 03/03/2020   Procedure: RE-EXCISION BILATERAL BREAST LUMPECTOMY;  Surgeon: Erroll Luna, MD;  Location: [place];  Service: General;  Laterality: Bilateral;   VESICOVAGINAL FISTULA CLOSURE W/ TAH  1992     A IV Location/Drains/Wounds Patient Lines/Drains/Airways Status     Active Line/Drains/Airways     Name Placement date Placement time Site Days   Peripheral IV 10/21/21 18 G Left Antecubital 10/21/21  --  Antecubital  less than 1   Airway 03/03/20  1531  -- 597   Incision (Closed) 02/04/20 Breast Other (Comment) 02/04/20  1645  -- 625   Incision (Closed) 03/03/20 Breast Left 03/03/20  1455  --  597   Incision (Closed) 03/03/20 Breast Right 03/03/20  1455  -- 597            Intake/Output Last 24 hours No intake or output data in the 24 hours ending 10/21/21 2124  Labs/Imaging Results for orders placed or performed during the hospital encounter of 10/21/21 (from the past 48 hour(s))  Basic metabolic panel     Status: Abnormal   Collection Time: 10/21/21  5:47 PM  Result Value Ref Range   Sodium 141 135 - 145 mmol/L   Potassium 3.3 (L) 3.5 - 5.1 mmol/L   Chloride 107 98 - 111 mmol/L   CO2 27 22 - 32 mmol/L   Glucose, Bld 148 (H) 70 - 99 mg/dL    Comment: Glucose reference range applies only to samples taken after fasting for at least 8 hours.   BUN 9 8 - 23 mg/dL   Creatinine, Ser 0.92 0.44 - 1.00 mg/dL   Calcium 9.3 8.9 - 10.3 mg/dL   GFR, Estimated >60 >60 mL/min    Comment: (NOTE) Calculated using the CKD-EPI Creatinine Equation (2021)    Anion gap 7 5 - 15    Comment: Performed at [facility] [address], [place], [name] [address]  CBC     Status: None   Collection Time: 10/21/21  5:47 PM  Result Value Ref Range   WBC 5.1 4.0 - 10.5 K/uL   RBC 4.41 3.87 - 5.11 MIL/uL   Hemoglobin 14.2 12.0 - 15.0 g/dL   HCT 43.1 36.0 - 46.0 %   MCV 97.7 80.0 - 100.0 fL   MCH 32.2 26.0 - 34.0 pg   MCHC 32.9 30.0 - 36.0 g/dL   RDW 13.0 11.5 - 15.5 %   Platelets 184 150 - 400 K/uL   nRBC 0.0 0.0 - 0.2 %    Comment: Performed at [facility], [place] [address] [place], [place] [address]  Troponin I (High Sensitivity)     Status: Abnormal   Collection Time: 10/21/21  5:47 PM  Result Value Ref Range   Troponin I (High Sensitivity) 43 (H) <18 ng/L    Comment: (NOTE) Elevated high sensitivity troponin I (hsTnI) values and significant  changes across serial measurements may suggest ACS but many other  chronic and acute conditions are known to elevate hsTnI results.  Refer to the "Links" section for chest pain algorithms and additional  guidance. Performed at  [facility], [place] [address], [place], [place] [address]   TSH     Status: None   Collection Time: 10/21/21  5:48 PM  Result Value Ref Range   TSH 3.212 0.350 - 4.500 uIU/mL    Comment: Performed by a 3rd Generation assay with a functional sensitivity of <=0.01 uIU/mL. Performed at [facility], [place] [address], [place], [place] [address]   D-dimer, quantitative     Status: None   Collection Time:  10/21/21  6:07 PM  Result Value Ref Range   D-Dimer, Quant 0.45 0.00 - 0.50 ug/mL-FEU    Comment: (NOTE) At the manufacturer cut-off value of 0.5 g/mL FEU, this assay has a negative predictive value of 95-100%.This assay is intended for use in conjunction with a clinical pretest probability (PTP) assessment model to exclude pulmonary embolism (PE) and deep venous thrombosis (DVT) in outpatients suspected of PE or DVT. Results should be correlated with clinical presentation. Performed at [facility], [place] [address] [place], [place] [address]    DG Chest 2 View  Result Date: 10/21/2021 CLINICAL DATA:  Right chest pain, palpitations EXAM: CHEST - 2 VIEW COMPARISON:  Previous studies including the examination of 05/30/2021 FINDINGS: Cardiac size is within normal limits. There are no signs of pulmonary edema or focal pulmonary consolidation. Linear densities in right parahilar region appears stable suggesting scarring. Small linear density in left lower lung field near the diaphragm has not changed. Small blebs are seen in both apices with no change. IMPRESSION: There are no new infiltrates or signs of pulmonary edema. Electronically Signed   By: Elmer Picker M.D.   On: 10/21/2021 18:35    Pending Labs Unresulted Labs (From admission, onward)    None       Vitals/Pain Today's Vitals   10/21/21 1748 10/21/21 1751 10/21/21 1751  BP:  134/79   Pulse:  (!) 104   Resp:  15   Temp:   97.8 F (36.6 C)  TempSrc:   Oral  SpO2:  93%   Weight: 63.5 kg    Height: 5'  7" (1.702 m)      Isolation Precautions No active isolations  Medications Medications  potassium chloride SA (KLOR-CON M) CR tablet 20 mEq (has no administration in time range)  acetaminophen (TYLENOL) tablet 650 mg (has no administration in time range)  ondansetron (ZOFRAN) injection 4 mg (has no administration in time range)  enoxaparin (LOVENOX) injection 40 mg (has no administration in time range)  anastrozole (ARIMIDEX) tablet 1 mg (has no administration in time range)  fluticasone furoate-vilanterol (BREO ELLIPTA) 100-25 MCG/ACT 1 puff (has no administration in time range)    And  umeclidinium bromide (INCRUSE ELLIPTA) 62.5 MCG/ACT 1 puff (has no administration in time range)  albuterol (VENTOLIN HFA) 108 (90 Base) MCG/ACT inhaler 2 puff (has no administration in time range)    Mobility walks Low fall risk   Focused Assessments     R Recommendations: See Admitting Provider Note  Report given to:   Additional Notes:

## 2021-10-21 NOTE — ED Triage Notes (Addendum)
Pt bib gcems from home for non radiating R sided chest pain and palpitations starting this morning. Denies sob, n/v, abdominal pain. 324 aspirin and 1 nitro given PTA. Pain subsided after nitro.   BP 160/110 HR 130, spo2 100%

## 2021-10-21 NOTE — Assessment & Plan Note (Signed)
  1. Continue anastrozole 2. Outpatient follow-up.

## 2021-10-21 NOTE — Assessment & Plan Note (Signed)
Non-STEMI.   -On admission initial concern for unstable angina.  DDx includes pulmonary dz, though no worsening SOB, no other pulm symptoms worse at this time; malignancy (recurrent breast CA) as cause of CP. PE unlikely given neg d.dimer. Patient ruled in with elevated cardiac enzymes. -2D echo ordered with septal, apical and inferior apical hypokinesis with left ventricular EF of 45 to 50%, left ventricular internal cavity size mildly dilated, mild LVH, grade 1 diastolic dysfunction. -Patient started on heparin drip. -Patient seen in consultation by cardiology and patient scheduled for cardiac catheterization on Tuesday, 10/24/2021.

## 2021-10-21 NOTE — H&P (Signed)
History and Physical    Patient: [name] QHU:[id number] DOB: 01/18/1941 DOA: 10/21/2021 DOS: the patient was seen and examined on 10/21/2021 PCP: Jonathon Jordan, MD  Patient coming from: Home  Chief Complaint:  Chief Complaint  Patient presents with   Chest Pain   HPI: [name] is a 81 y.o. female with medical history significant of HTN (chart diagnosis, pt denies), COPD (due to [facility] and Nocardia, never smoker), right breast cancer and left breast DCIS underwent bilateral lumpectomies followed by re-excision and radiation therapy, currently on anastrozole.  Patient had onset of racing heart palpitations and right-sided chest pain starting about 45 minutes prior to arrival.  She states it lasted approximately 15 minutes.  Resolved with 1 SL NTG by EMS.  Denies smoking, HTN, HLD.  She states that both parents had heart attacks in their 47s and 27s.  She had a similar episode earlier this week lasting about 15 minutes and had 2 baby aspirin's at the resolved her pain.  She did not have any diaphoresis, nausea, vomiting, shortness of breath.  The pain did not radiate.  She states that she took a full dose aspirin prior to EMS arrival.  Initial vital signs per EMS were 160/110 blood pressure heart rate in the 130s.  No active CP at this time.   Review of Systems: As mentioned in the history of present illness. All other systems reviewed and are negative. Past Medical History:  Diagnosis Date   Allergy    Anemia    Breast cancer ([place])    Cataract    COPD (chronic obstructive pulmonary disease) ([place])    Depression    Essential hypertension 11/29/2014   Glaucoma    Hemoptysis    Hypothyroidism    Left bundle branch block 03/16/2005   Oxygen deficiency    patient was using oxygen at home, her pulmonologist discontinued it and patient stopped using it on Monday Mar 20, 2015   Personal history of chemotherapy    Personal history of radiation therapy     Restless leg syndrome    Sinusitis    Vertigo    Past Surgical History:  Procedure Laterality Date   ABDOMINAL HYSTERECTOMY     APPENDECTOMY  2009   BREAST CYST ASPIRATION Right 06/12/2016   BREAST LUMPECTOMY WITH RADIOACTIVE SEED AND SENTINEL LYMPH NODE BIOPSY Bilateral 02/04/2020   Procedure: BILATERAL BREAST LUMPECTOMY WITH RADIOACTIVE SEED , RIGHT X 2, LEFT X 1 AND RIGHT SENTINEL LYMPH NODE MAPPING;  Surgeon: Erroll Luna, MD;  Location: [place];  Service: General;  Laterality: Bilateral;   RE-EXCISION OF BREAST LUMPECTOMY Bilateral 03/03/2020   Procedure: RE-EXCISION BILATERAL BREAST LUMPECTOMY;  Surgeon: Erroll Luna, MD;  Location: [place];  Service: General;  Laterality: Bilateral;   VESICOVAGINAL FISTULA CLOSURE W/ TAH  1992   Social History:  reports that she has never smoked. She has never used smokeless tobacco. She reports that she does not drink alcohol and does not use drugs.  Allergies  Allergen Reactions   Augmentin [Amoxicillin-Pot Clavulanate] Nausea And Vomiting    .Marland KitchenHas patient had a PCN reaction causing immediate rash, facial/tongue/throat swelling, SOB or lightheadedness with hypotension: No Has patient had a PCN reaction causing severe rash involving mucus membranes or skin necrosis: No Has patient had a PCN reaction that required hospitalization No Has patient had a PCN reaction occurring within the last 10 years: No If all of the above answers are "NO", then may proceed with Cephalosporin use.  Black Cohosh Nausea And Vomiting    Severe GI Upset, sweats   Codeine Nausea And Vomiting    Severe GI upset, sweats   Hyoscyamine Other (See Comments)    Cramps, made urinary symptoms worse   Oxybutynin Chloride Other (See Comments)    Cramps, made urinary symptoms worse   Minocycline Other (See Comments)    Scratchy tongue, swollen lips    Family History  Problem Relation Age of Onset   Emphysema Mother        smoker   Heart disease  Mother    COPD Mother    Colon polyps Mother    Emphysema Father        smoker   Heart disease Father    Breast cancer Sister    Colon cancer Neg Hx    Esophageal cancer Neg Hx    Stomach cancer Neg Hx    Rectal cancer Neg Hx     Prior to Admission medications   Medication Sig Start Date End Date Taking? Authorizing Provider  albuterol (VENTOLIN HFA) 108 (90 Base) MCG/ACT inhaler Inhale 2 puffs into the lungs every 6 (six) hours as needed for wheezing or shortness of breath. 11/27/18   Deneise Lever, MD  anastrozole (ARIMIDEX) 1 MG tablet Take 1 tablet (1 mg total) by mouth daily. 02/03/21   Nicholas Lose, MD  azelastine (ASTELIN) 0.1 % nasal spray 1-2 puffs each nostril twice daily if needed Patient taking differently: Place 1-2 sprays into both nostrils daily as needed for rhinitis. 11/26/17   Deneise Lever, MD  benzonatate (TESSALON) 200 MG capsule Take 1 capsule (200 mg total) by mouth 3 (three) times daily as needed for cough. 05/27/20   Deneise Lever, MD  BETA CAROTENE PO Take 7,500 mcg by mouth daily.    [provider]  Cholecalciferol (VITAMIN D) 50 MCG (2000 UT) tablet Take 2,000 Units by mouth daily.    [provider]  citalopram (CELEXA) 20 MG tablet Take 20 mg by mouth daily.    [provider]  clindamycin (CLEOCIN) 300 MG capsule Take 1 capsule (300 mg total) by mouth 3 (three) times daily. 03/03/20   Cornett, [name], MD  Coenzyme Q10 (COQ-10) 100 MG CAPS Take 100 mg by mouth daily.    [provider]  Cyanocobalamin (B-12 PO) Take 1,000 mg by mouth daily.    [provider]  dorzolamide (TRUSOPT) 2 % ophthalmic solution Place 1 drop into both eyes 2 (two) times daily.  08/25/18   [provider]  ferrous sulfate 325 (65 FE) MG tablet Take 325 mg by mouth daily with breakfast.    [provider]  fluticasone (FLONASE) 50 MCG/ACT nasal spray Place 2 sprays into both nostrils daily. Patient taking differently:  Place 2 sprays into both nostrils daily as needed for allergies. 07/29/13   Campbell Riches, MD  Fluticasone-Umeclidin-Vilant (TRELEGY ELLIPTA) 100-62.5-25 MCG/ACT AEPB Inhale 1 puff into the lungs daily. 02/16/21   Baird Lyons D, MD  Fluticasone-Umeclidin-Vilant (TRELEGY ELLIPTA) 100-62.5-25 MCG/INH AEPB Inhale 1 puff into the lungs daily. 11/27/19   Baird Lyons D, MD  Fluticasone-Umeclidin-Vilant (TRELEGY ELLIPTA) 100-62.5-25 MCG/INH AEPB Inhale 1 puff into the lungs daily. 05/27/20   Deneise Lever, MD  folic acid (FOLVITE) 254 MCG tablet Take 400 mcg by mouth daily.    [provider]  HYDROcodone-acetaminophen (NORCO/VICODIN) 5-325 MG tablet Take 1 tablet by mouth every 6 (six) hours as needed for moderate pain. 03/03/20   Cornett,  [name], MD  Krill Oil 350 MG CAPS Take 350 mg by mouth daily.    [provider]  latanoprost (XALATAN) 0.005 % ophthalmic solution Place 1 drop into both eyes at bedtime.    [provider]  levothyroxine (SYNTHROID, LEVOTHROID) 88 MCG tablet Take 88 mcg by mouth daily before breakfast.    [provider]  Multiple Minerals (CALCIUM/MAGNESIUM/ZINC) TABS Take 3 tablets by mouth at bedtime. '1800mg'$      [provider]  Multiple Vitamin (MULTIVITAMIN) tablet Take 1 tablet by mouth daily.    [provider]  Naproxen Sodium 220 MG CAPS Take 440 mg by mouth daily as needed (pain).    [provider]  NONFORMULARY OR COMPOUNDED ITEM  Apothecary - Antifungal topical - Terbinafine 3%, Fluconazole 2%, Tea Tree Oil 5%, Urea 10%, Ibuprofen 2%, + Itraconazole 3%, in #62m DMSO suspension. Apply to affected toenail(s) once at bedtime or twice daily. Patient taking differently: Apply 1 application. topically daily as needed (Toe fungus). [facility]- Antifungal topical - Terbinafine 3%, Fluconazole 2%, Tea Tree Oil 5%, Urea 10%, Ibuprofen 2%, + Itraconazole 3%, in #344mDMSO suspension. Apply to  affected toenail(s) once at bedtime or twice daily. 10/28/18   WaTrula SladeDPM  rosuvastatin (CRESTOR) 5 MG tablet Take 5 mg by mouth at bedtime. 05/29/19   [provider]  vitamin C (ASCORBIC ACID) 500 MG tablet Take 500 mg by mouth daily.    [provider]  vitamin E 400 UNIT capsule Take 400 Units by mouth daily.    [provider]    Physical Exam: Vitals:   10/21/21 1748 10/21/21 1751 10/21/21 1751  BP:  134/79   Pulse:  (!) 104   Resp:  15   Temp:   97.8 F (36.6 C)  TempSrc:   Oral  SpO2:  93%   Weight: 63.5 kg    Height: '5\' 7"'$  (1.702 m)     Constitutional: NAD, calm, comfortable Eyes: PERRL, lids and conjunctivae normal ENMT: Mucous membranes are moist. Posterior pharynx clear of any exudate or lesions.Normal dentition.  Neck: normal, supple, no masses, no thyromegaly Respiratory: clear to auscultation bilaterally, no wheezing, no crackles. Normal respiratory effort. No accessory muscle use.  Cardiovascular: RRR w/o MRG. Abdomen: no tenderness, no masses palpated. No hepatosplenomegaly. Bowel sounds positive.  Musculoskeletal: no clubbing / cyanosis. No joint deformity upper and lower extremities. Good ROM, no contractures. Normal muscle tone.  Skin: no rashes, lesions, ulcers. No induration Neurologic: CN 2-12 grossly intact. Sensation intact, DTR normal. Strength 5/5 in all 4.  Psychiatric: Normal judgment and insight. Alert and oriented x 3. Normal mood.   Data Reviewed:     EKG = chronic LBBB, new S.Tach with HR 112  CXR = IMPRESSION: There are no new infiltrates or signs of pulmonary edema.  D.Dimer neg  TSH nl  CBC wnl  BMP: K 3.3, BGL 148, otherwise WNL.  Trop 43  Assessment and Plan: * Chest pain, rule out acute myocardial infarction Concern for unstable angina.  DDx includes pulmonary dz, though no worsening SOB, no other pulm symptoms worse at this time; malignancy (recurrent breast CA) as cause of CP. PE unlikely  given neg d.dimer. CP obs pathway Serial trops, first is slightly positive at 43 Tele monitor NPO after MN 2d echo ordered Cards recd consider CT coronary as next step (assuming trops dont rise further, call cards for formal consult if they do obviously). May wish to also obtain CT  chest to evaluate lungs if rest of work up negative, given her h/o pulmonary disease as well as breast CA.  Malignant neoplasm of upper-outer quadrant of right breast in female, estrogen receptor positive ([place])  Continue anastrozole  Essential hypertension Listed in chart but pt denies history of. 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 But no new cough, SOB.  CXR today doesn't show any new infiltrates, etc. Cont home inhalers      Advance Care Planning:   Code Status: Full Code  Consults: EDP spoke with cards  Family Communication: No family in room  Severity of Illness: The appropriate patient status for this patient is OBSERVATION. Observation status is judged to be reasonable and necessary in order to provide the required intensity of service to ensure the patient's safety. The patient's presenting symptoms, physical exam findings, and initial radiographic and laboratory data in the context of their medical condition is felt to place them at decreased risk for further clinical deterioration. Furthermore, it is anticipated that the patient will be medically stable for discharge from the hospital within 2 midnights of admission.   Author: Etta Quill., DO 10/21/2021 8:36 PM  For on call review [email or web address].

## 2021-10-22 ENCOUNTER — Observation Stay (HOSPITAL_COMMUNITY): Payer: Medicare PPO

## 2021-10-22 DIAGNOSIS — Z9221 Personal history of antineoplastic chemotherapy: Secondary | ICD-10-CM | POA: Diagnosis not present

## 2021-10-22 DIAGNOSIS — N390 Urinary tract infection, site not specified: Secondary | ICD-10-CM

## 2021-10-22 DIAGNOSIS — W010XXA Fall on same level from slipping, tripping and stumbling without subsequent striking against object, initial encounter: Secondary | ICD-10-CM | POA: Diagnosis not present

## 2021-10-22 DIAGNOSIS — I2510 Atherosclerotic heart disease of native coronary artery without angina pectoris: Secondary | ICD-10-CM | POA: Diagnosis not present

## 2021-10-22 DIAGNOSIS — Z825 Family history of asthma and other chronic lower respiratory diseases: Secondary | ICD-10-CM | POA: Diagnosis not present

## 2021-10-22 DIAGNOSIS — Z923 Personal history of irradiation: Secondary | ICD-10-CM | POA: Diagnosis not present

## 2021-10-22 DIAGNOSIS — E039 Hypothyroidism, unspecified: Secondary | ICD-10-CM

## 2021-10-22 DIAGNOSIS — Z888 Allergy status to other drugs, medicaments and biological substances status: Secondary | ICD-10-CM | POA: Diagnosis not present

## 2021-10-22 DIAGNOSIS — R079 Chest pain, unspecified: Secondary | ICD-10-CM

## 2021-10-22 DIAGNOSIS — I214 Non-ST elevation (NSTEMI) myocardial infarction: Secondary | ICD-10-CM | POA: Diagnosis present

## 2021-10-22 DIAGNOSIS — Z853 Personal history of malignant neoplasm of breast: Secondary | ICD-10-CM | POA: Diagnosis not present

## 2021-10-22 DIAGNOSIS — G9341 Metabolic encephalopathy: Secondary | ICD-10-CM | POA: Diagnosis present

## 2021-10-22 DIAGNOSIS — S5001XA Contusion of right elbow, initial encounter: Secondary | ICD-10-CM | POA: Diagnosis not present

## 2021-10-22 DIAGNOSIS — N3000 Acute cystitis without hematuria: Secondary | ICD-10-CM

## 2021-10-22 DIAGNOSIS — I429 Cardiomyopathy, unspecified: Secondary | ICD-10-CM | POA: Diagnosis present

## 2021-10-22 DIAGNOSIS — Z8249 Family history of ischemic heart disease and other diseases of the circulatory system: Secondary | ICD-10-CM | POA: Diagnosis not present

## 2021-10-22 DIAGNOSIS — Z955 Presence of coronary angioplasty implant and graft: Secondary | ICD-10-CM | POA: Diagnosis not present

## 2021-10-22 DIAGNOSIS — I447 Left bundle-branch block, unspecified: Secondary | ICD-10-CM | POA: Diagnosis present

## 2021-10-22 DIAGNOSIS — C50411 Malignant neoplasm of upper-outer quadrant of right female breast: Secondary | ICD-10-CM | POA: Diagnosis present

## 2021-10-22 DIAGNOSIS — I70298 Other atherosclerosis of native arteries of extremities, other extremity: Secondary | ICD-10-CM | POA: Diagnosis present

## 2021-10-22 DIAGNOSIS — I10 Essential (primary) hypertension: Secondary | ICD-10-CM | POA: Diagnosis present

## 2021-10-22 DIAGNOSIS — J479 Bronchiectasis, uncomplicated: Secondary | ICD-10-CM | POA: Diagnosis present

## 2021-10-22 DIAGNOSIS — Z885 Allergy status to narcotic agent status: Secondary | ICD-10-CM | POA: Diagnosis not present

## 2021-10-22 DIAGNOSIS — Z79899 Other long term (current) drug therapy: Secondary | ICD-10-CM | POA: Diagnosis not present

## 2021-10-22 DIAGNOSIS — Z7982 Long term (current) use of aspirin: Secondary | ICD-10-CM | POA: Diagnosis not present

## 2021-10-22 DIAGNOSIS — S0990XA Unspecified injury of head, initial encounter: Secondary | ICD-10-CM | POA: Diagnosis present

## 2021-10-22 DIAGNOSIS — Z170 Estrogen receptor positive status [ER+]: Secondary | ICD-10-CM | POA: Diagnosis not present

## 2021-10-22 DIAGNOSIS — I25110 Atherosclerotic heart disease of native coronary artery with unstable angina pectoris: Secondary | ICD-10-CM | POA: Diagnosis present

## 2021-10-22 DIAGNOSIS — Z7902 Long term (current) use of antithrombotics/antiplatelets: Secondary | ICD-10-CM | POA: Diagnosis not present

## 2021-10-22 DIAGNOSIS — Z881 Allergy status to other antibiotic agents status: Secondary | ICD-10-CM | POA: Diagnosis not present

## 2021-10-22 DIAGNOSIS — S0083XA Contusion of other part of head, initial encounter: Secondary | ICD-10-CM | POA: Diagnosis not present

## 2021-10-22 DIAGNOSIS — Z79811 Long term (current) use of aromatase inhibitors: Secondary | ICD-10-CM | POA: Diagnosis not present

## 2021-10-22 LAB — TROPONIN I (HIGH SENSITIVITY)
Troponin I (High Sensitivity): 698 ng/L (ref <18)
Troponin I (High Sensitivity): 552 ng/L (ref <18)

## 2021-10-22 LAB — LIPID PANEL
Cholesterol: 196 mg/dL (ref 0–200)
HDL: 55 mg/dL (ref >40)
LDL Cholesterol: 81 mg/dL (ref 0–99)
Total CHOL/HDL Ratio: 3.6 RATIO
Triglycerides: 299 mg/dL — ABNORMAL HIGH (ref <150)
VLDL: 60 mg/dL — ABNORMAL HIGH (ref 0–40)

## 2021-10-22 LAB — URINALYSIS, COMPLETE (UACMP) WITH MICROSCOPIC
Bilirubin Urine: NEGATIVE
Glucose, UA: NEGATIVE mg/dL
Hgb urine dipstick: NEGATIVE
Ketones, ur: NEGATIVE mg/dL
Nitrite: POSITIVE — AB
Protein, ur: NEGATIVE mg/dL
Specific Gravity, Urine: 1.004 — ABNORMAL LOW (ref 1.005–1.030)
pH: 7 (ref 5.0–8.0)

## 2021-10-22 LAB — CBC
HCT: 45.6 % (ref 36.0–46.0)
Hemoglobin: 14.8 g/dL (ref 12.0–15.0)
MCH: 31.6 pg (ref 26.0–34.0)
MCHC: 32.5 g/dL (ref 30.0–36.0)
MCV: 97.2 fL (ref 80.0–100.0)
Platelets: 189 10*3/uL (ref 150–400)
RBC: 4.69 MIL/uL (ref 3.87–5.11)
RDW: 13.2 % (ref 11.5–15.5)
WBC: 5.5 10*3/uL (ref 4.0–10.5)
nRBC: 0 % (ref 0.0–0.2)

## 2021-10-22 LAB — BASIC METABOLIC PANEL
Anion gap: 12 (ref 5–15)
BUN: 7 mg/dL — ABNORMAL LOW (ref 8–23)
CO2: 27 mmol/L (ref 22–32)
Calcium: 9.3 mg/dL (ref 8.9–10.3)
Chloride: 104 mmol/L (ref 98–111)
Creatinine, Ser: 0.77 mg/dL (ref 0.44–1.00)
GFR, Estimated: >60 mL/min (ref >60)
Glucose, Bld: 152 mg/dL — ABNORMAL HIGH (ref 70–99)
Potassium: 3.4 mmol/L — ABNORMAL LOW (ref 3.5–5.1)
Sodium: 143 mmol/L (ref 135–145)

## 2021-10-22 LAB — ECHOCARDIOGRAM COMPLETE
AR max vel: 2.05 cm2
AV Area VTI: 2.02 cm2
AV Area mean vel: 2.03 cm2
AV Mean grad: 2 mmHg
AV Peak grad: 3.2 mmHg
Ao pk vel: 0.89 m/s
Height: 68 in
S' Lateral: 2.9 cm
Weight: 2261.04 oz

## 2021-10-22 LAB — MAGNESIUM: Magnesium: 2.1 mg/dL (ref 1.7–2.4)

## 2021-10-22 LAB — HEPARIN LEVEL (UNFRACTIONATED)
Heparin Unfractionated: 0.44 IU/mL (ref 0.30–0.70)
Heparin Unfractionated: 0.32 IU/mL (ref 0.30–0.70)

## 2021-10-22 LAB — TSH: TSH: 8.344 u[IU]/mL — ABNORMAL HIGH (ref 0.350–4.500)

## 2021-10-22 MED ORDER — ASPIRIN 81 MG PO TBEC
81.0000 mg | DELAYED_RELEASE_TABLET | Freq: Every day | ORAL | Status: AC
  Administered 2021-10-22 – 2021-10-23 (×2): 81 mg via ORAL
  Filled 2021-10-22 (×2): qty 1

## 2021-10-22 MED ORDER — SODIUM CHLORIDE 0.9 % IV SOLN
250.0000 mL | INTRAVENOUS | Status: DC | PRN
Start: 2021-10-22 — End: 2021-10-24

## 2021-10-22 MED ORDER — ASPIRIN 81 MG PO TBEC
81.0000 mg | DELAYED_RELEASE_TABLET | Freq: Every day | ORAL | Status: AC
  Administered 2021-10-25 – 2021-10-26 (×2): 81 mg via ORAL
  Filled 2021-10-22 (×2): qty 1

## 2021-10-22 MED ORDER — ASPIRIN 81 MG PO CHEW
81.0000 mg | CHEWABLE_TABLET | ORAL | Status: AC
  Administered 2021-10-24: 81 mg via ORAL
  Filled 2021-10-22: qty 1

## 2021-10-22 MED ORDER — PERFLUTREN LIPID MICROSPHERE
1.0000 mL | INTRAVENOUS | Status: AC | PRN
  Administered 2021-10-22: 2 mL via INTRAVENOUS

## 2021-10-22 MED ORDER — SODIUM CHLORIDE 0.9 % IV SOLN
1.0000 g | INTRAVENOUS | Status: DC
  Administered 2021-10-22 – 2021-10-26 (×5): 1 g via INTRAVENOUS
  Filled 2021-10-22 (×5): qty 10

## 2021-10-22 MED ORDER — SODIUM CHLORIDE 0.9% FLUSH
3.0000 mL | Freq: Two times a day (BID) | INTRAVENOUS | Status: DC
Start: 2021-10-22 — End: 2021-10-24
  Administered 2021-10-22 – 2021-10-24 (×5): 3 mL via INTRAVENOUS

## 2021-10-22 MED ORDER — SODIUM CHLORIDE 0.9 % WEIGHT BASED INFUSION
3.0000 mL/kg/h | INTRAVENOUS | Status: DC
  Administered 2021-10-24: 3 mL/kg/h via INTRAVENOUS

## 2021-10-22 MED ORDER — METOPROLOL TARTRATE 25 MG PO TABS
25.0000 mg | ORAL_TABLET | Freq: Two times a day (BID) | ORAL | Status: DC
  Administered 2021-10-22 – 2021-10-25 (×7): 25 mg via ORAL
  Filled 2021-10-22 (×7): qty 1

## 2021-10-22 MED ORDER — SODIUM CHLORIDE 0.9% FLUSH
3.0000 mL | INTRAVENOUS | Status: DC | PRN
Start: 2021-10-22 — End: 2021-10-24

## 2021-10-22 MED ORDER — SODIUM CHLORIDE 0.9 % WEIGHT BASED INFUSION
1.0000 mL/kg/h | INTRAVENOUS | Status: DC
  Administered 2021-10-24: 1 mL/kg/h via INTRAVENOUS

## 2021-10-22 MED ORDER — POTASSIUM CHLORIDE CRYS ER 10 MEQ PO TBCR
40.0000 meq | EXTENDED_RELEASE_TABLET | Freq: Once | ORAL | Status: AC
  Administered 2021-10-22: 40 meq via ORAL
  Filled 2021-10-22: qty 4

## 2021-10-22 MED ORDER — ATORVASTATIN CALCIUM 80 MG PO TABS
80.0000 mg | ORAL_TABLET | Freq: Every day | ORAL | Status: DC
  Administered 2021-10-22 – 2021-10-26 (×5): 80 mg via ORAL
  Filled 2021-10-22 (×5): qty 1

## 2021-10-22 NOTE — Assessment & Plan Note (Signed)
-   Urine cultures pending. -Continue IV Rocephin. -Could likely transition to oral antibiotics postcardiac catheterization and treat for total of 3 to 5 days.

## 2021-10-22 NOTE — Progress Notes (Signed)
Rounding Note    Patient Name: [name] Date of Encounter: 10/22/2021  [place] Cardiologist: None New - Dr. Margaretann Loveless  Subjective   No recurrent chest pain since yesterday.   Inpatient Medications    Scheduled Meds:  anastrozole  1 mg Oral Daily   aspirin EC  81 mg Oral Daily   atorvastatin  80 mg Oral Daily   ferrous sulfate  325 mg Oral Q breakfast   fluticasone furoate-vilanterol  1 puff Inhalation Daily   And   umeclidinium bromide  1 puff Inhalation Daily   latanoprost  1 drop Both Eyes QHS   levothyroxine  75 mcg Oral Q0600   metoprolol tartrate  25 mg Oral BID   multivitamin with minerals  1 tablet Oral Daily   venlafaxine XR  37.5 mg Oral Daily   Continuous Infusions:  cefTRIAXone (ROCEPHIN)  IV 1 g (10/22/21 0840)   heparin 750 Units/hr (10/21/21 2308)   PRN Meds: acetaminophen, albuterol, ondansetron (ZOFRAN) IV, perflutren lipid microspheres (DEFINITY) IV suspension   Vital Signs    Vitals:   10/21/21 2135 10/21/21 2213 10/22/21 0304 10/22/21 0831  BP: 134/68 (!) 145/78 (!) 140/74   Pulse: 88 78 91   Resp: '16 16 16   '$ Temp: 98 F (36.7 C) 97.7 F (36.5 C) 97.6 F (36.4 C)   TempSrc:  Oral Oral   SpO2: 95% 100% 97% 95%  Weight:  64.1 kg    Height:  '5\' 8"'$  (1.727 m)      Intake/Output Summary (Last 24 hours) at 10/22/2021 1049 Last data filed at 10/22/2021 0551 Gross per 24 hour  Intake 538.34 ml  Output 1300 ml  Net -761.66 ml      10/21/2021   10:13 PM 10/21/2021    5:48 PM 05/30/2021   10:33 AM  Last 3 Weights  Weight (lbs) 141 lb 5 oz 140 lb 140 lb 6.4 oz  Weight (kg) 64.1 kg 63.504 kg 63.685 kg      Telemetry    ST LBBB - Personally Reviewed  ECG    ST vs ectopic atrial tachycardia, LBBB - Personally Reviewed  Physical Exam   GEN: No acute distress.   Neck: No JVD Cardiac: RRR, no murmurs, rubs, or gallops.  Respiratory: Clear to auscultation bilaterally. GI: Soft, nontender, non-distended  MS: No edema;  No deformity. Neuro:  Nonfocal  Psych: Normal affect   Labs    High Sensitivity Troponin:   Recent Labs  Lab 10/21/21 1747 10/21/21 2054 10/22/21 0609 10/22/21 0908  TROPONINIHS 43* 794* 698* 552*     Chemistry Recent Labs  Lab 10/21/21 1747 10/22/21 0908  NA 141 143  K 3.3* 3.4*  CL 107 104  CO2 27 27  GLUCOSE 148* 152*  BUN 9 7*  CREATININE 0.92 0.77  CALCIUM 9.3 9.3  MG  --  2.1  GFRNONAA >60 >60  ANIONGAP 7 12    Lipids  Recent Labs  Lab 10/22/21 0609  CHOL 196  TRIG 299*  HDL 55  LDLCALC 81  CHOLHDL 3.6    Hematology Recent Labs  Lab 10/21/21 1747 10/22/21 0557  WBC 5.1 5.5  RBC 4.41 4.69  HGB 14.2 14.8  HCT 43.1 45.6  MCV 97.7 97.2  MCH 32.2 31.6  MCHC 32.9 32.5  RDW 13.0 13.2  PLT 184 189   Thyroid  Recent Labs  Lab 10/22/21 0609  TSH 8.344*    BNPNo results for input(s): "BNP", "PROBNP" in the last  168 hours.  DDimer  Recent Labs  Lab 10/21/21 1807  DDIMER 0.45     Radiology    ECHOCARDIOGRAM COMPLETE  Result Date: 10/22/2021    ECHOCARDIOGRAM REPORT   Patient Name:   [name] Date of Exam: 10/22/2021 Medical Rec #:  [id number]            Height:       68.0 in Accession #:    [id number]           Weight:       141.3 lb Date of Birth:  January 12, 1941             BSA:          1.763 m Patient Age:    81 years             BP:           140/74 mmHg Patient Gender: F                    HR:           109 bpm. Exam Location:  Inpatient Procedure: 2D Echo, Cardiac Doppler, Color Doppler and Intracardiac            Opacification Agent Indications:    Chest pain  History:        Patient has no prior history of Echocardiogram examinations.                 COPD; Risk Factors:Hypertension.  Sonographer:    Clayton Lefort RDCS (AE) Referring Phys: [place]  1. Septal, apical and inferior apical hypokinesis . Left ventricular ejection fraction, by estimation, is 45 to 50%. The left ventricle has mildly decreased function. The  left ventricle has no regional wall motion abnormalities. The left ventricular internal cavity size was mildly dilated. There is mild left ventricular hypertrophy. Left ventricular diastolic parameters are consistent with Grade I diastolic dysfunction (impaired relaxation).  2. Right ventricular systolic function is normal. The right ventricular size is normal.  3. The mitral valve is abnormal. Trivial mitral valve regurgitation. No evidence of mitral stenosis. Moderate mitral annular calcification.  4. The aortic valve is tricuspid. There is mild calcification of the aortic valve. There is mild thickening of the aortic valve. Aortic valve regurgitation is mild. Aortic valve sclerosis is present, with no evidence of aortic valve stenosis.  5. The inferior vena cava is normal in size with greater than 50% respiratory variability, suggesting right atrial pressure of 3 mmHg. FINDINGS  Left Ventricle: Septal, apical and inferior apical hypokinesis. Left ventricular ejection fraction, by estimation, is 45 to 50%. The left ventricle has mildly decreased function. The left ventricle has no regional wall motion abnormalities. Definity contrast agent was given IV to delineate the left ventricular endocardial borders. The left ventricular internal cavity size was mildly dilated. There is mild left ventricular hypertrophy. Left ventricular diastolic parameters are consistent with Grade I  diastolic dysfunction (impaired relaxation). Right Ventricle: The right ventricular size is normal. No increase in right ventricular wall thickness. Right ventricular systolic function is normal. Left Atrium: Left atrial size was normal in size. Right Atrium: Right atrial size was normal in size. Pericardium: There is no evidence of pericardial effusion. Mitral Valve: The mitral valve is abnormal. There is mild thickening of the mitral valve leaflet(s). There is mild calcification of the mitral valve leaflet(s). Moderate mitral annular  calcification. Trivial mitral valve regurgitation. No evidence of mitral  valve stenosis. Tricuspid Valve: The tricuspid valve is normal in structure. Tricuspid valve regurgitation is not demonstrated. No evidence of tricuspid stenosis. Aortic Valve: The aortic valve is tricuspid. There is mild calcification of the aortic valve. There is mild thickening of the aortic valve. Aortic valve regurgitation is mild. Aortic valve sclerosis is present, with no evidence of aortic valve stenosis. Aortic valve mean gradient measures 2.0 mmHg. Aortic valve peak gradient measures 3.2 mmHg. Aortic valve area, by VTI measures 2.02 cm. Pulmonic Valve: The pulmonic valve was normal in structure. Pulmonic valve regurgitation is trivial. No evidence of pulmonic stenosis. Aorta: The aortic root is normal in size and structure. Venous: The inferior vena cava is normal in size with greater than 50% respiratory variability, suggesting right atrial pressure of 3 mmHg. IAS/Shunts: No atrial level shunt detected by color flow Doppler.  LEFT VENTRICLE PLAX 2D LVIDd:         4.00 cm   Diastology LVIDs:         2.90 cm   LV e' lateral: 5.33 cm/s LV PW:         1.30 cm LV IVS:        1.30 cm LVOT diam:     2.00 cm LV SV:         32 LV SV Index:   18 LVOT Area:     3.14 cm  IVC IVC diam: 0.90 cm LEFT ATRIUM             Index LA diam:        2.70 cm 1.53 cm/m LA Vol (A2C):   39.1 ml 22.17 ml/m LA Vol (A4C):   31.6 ml 17.92 ml/m LA Biplane Vol: 38.9 ml 22.06 ml/m  AORTIC VALVE AV Area (Vmax):    2.05 cm AV Area (Vmean):   2.03 cm AV Area (VTI):     2.02 cm AV Vmax:           89.10 cm/s AV Vmean:          57.000 cm/s AV VTI:            0.157 m AV Peak Grad:      3.2 mmHg AV Mean Grad:      2.0 mmHg LVOT Vmax:         58.20 cm/s LVOT Vmean:        36.900 cm/s LVOT VTI:          0.101 m LVOT/AV VTI ratio: 0.64  AORTA Ao Root diam: 2.90 cm Ao Asc diam:  3.00 cm  SHUNTS Systemic VTI:  0.10 m Systemic Diam: 2.00 cm Jenkins Rouge MD Electronically  signed by Jenkins Rouge MD Signature Date/Time: 10/22/2021/10:46:27 AM    Final    DG Chest 2 View  Result Date: 10/21/2021 CLINICAL DATA:  Right chest pain, palpitations EXAM: CHEST - 2 VIEW COMPARISON:  Previous studies including the examination of 05/30/2021 FINDINGS: Cardiac size is within normal limits. There are no signs of pulmonary edema or focal pulmonary consolidation. Linear densities in right parahilar region appears stable suggesting scarring. Small linear density in left lower lung field near the diaphragm has not changed. Small blebs are seen in both apices with no change. IMPRESSION: There are no new infiltrates or signs of pulmonary edema. Electronically Signed   By: Elmer Picker M.D.   On: 10/21/2021 18:35    Cardiac Studies   Echo as above  Patient Profile     81 y.o. female with a hx  of  HTN (chart diagnosis, pt denies), COPD (due to [facility] and Nocardia, never smoker), right breast cancer and left breast DCIS underwent bilateral lumpectomies followed by re-excision and radiation therapy, currently on anastrozole. who is being seen for the evaluation of chest pain, with apparent NSTEMI  Assessment & Plan    Principal Problem:   Chest pain, rule out acute myocardial infarction Active Problems:   Bronchiectasis without acute exacerbation + MAIC   Essential hypertension   Malignant neoplasm of upper-outer quadrant of right breast in female, estrogen receptor positive ([place])  NSTEMI - elevated trop with signif delta. Chest "hurt" that started suddenly, last week and yesterday, leading to admission. Echo with reduced EF and regionals in LAD distribution. 3V coronary calcifications with dense prox LAD calcium on CT chest from 2020. LBBB though this may be chronic and seen on EKG in 2021.  - patient told cardiology overnight consult that she had palpitations but denies this for me. If no obstructive CAD, would consider ambulatory monitoring for tachyarrhythmia that could have  caused demand ischemia. Trop is already trending down.  - cath Tuesday. INFORMED CONSENT: I have reviewed the risks, indications, and alternatives to cardiac catheterization, possible angioplasty, and stenting with the patient. Risks include but are not limited to bleeding, infection, vascular injury, stroke, myocardial infarction, arrhythmia, kidney injury, radiation-related injury in the case of prolonged fluoroscopy use, emergency cardiac surgery, and death. The patient understands the risks of serious complication is 1-2 in 3810 with diagnostic cardiac cath and 1-2% or less with angioplasty/stenting.  -cont heparin IV - cont asa 81 mg daily - metop tart 25 mg BID - atorva 80 mg daily   UTI - no fever or WBC elevation. On ABX per primary service.       For questions or updates, please contact [place] Please consult [email or web address] for contact info under        Signed, Elouise Munroe, MD  10/22/2021, 10:49 AM

## 2021-10-22 NOTE — Consult Note (Signed)
Cardiology Consultation   Patient ID: BROOKLYN ALFREDO MRN: [id number]; DOB: January 24, 1941  Admit date: 10/21/2021 Date of Consult: 10/22/2021  PCP:  Jonathon Jordan, [facility] Cardiologist:  None        Patient Profile:   [name] is a 81 y.o. female with a hx of  HTN (chart diagnosis, pt denies), COPD (due to [facility] and Nocardia, never smoker), right breast cancer and left breast DCIS underwent bilateral lumpectomies followed by re-excision and radiation therapy, currently on anastrozole. who is being seen 10/22/2021 for the evaluation of chest pain at the request of Dr Alcario Drought.  History of Present Illness:   [name] is a 81 y.o. female with a hx of  HTN (chart diagnosis, pt denies), COPD (due to [facility] and Nocardia, never smoker), right breast cancer and left breast DCIS underwent bilateral lumpectomies followed by re-excision and radiation therapy, currently on anastrozole. who is being seen 10/22/2021 for the evaluation of chest pain  Patient had onset of racing heart palpitations and right-sided chest pain starting about 45 minutes prior to arrival.  She states it lasted approximately 15 minutes.  Resolved with 1 SL NTG by EMS. She had a similar episode earlier this week lasting about 15 minutes and had 2 baby aspirin's at the resolved her pain.  She did not have any diaphoresis, nausea, vomiting, shortness of breath.  The pain did not radiate.  She states that she took a full dose aspirin prior to EMS arrival.  Trop: 43->794 EKG: LBBB (chronic)  Past Medical History:  Diagnosis Date   Allergy    Anemia    Breast cancer ([place])    Cataract    COPD (chronic obstructive pulmonary disease) ([place])    Depression    Essential hypertension 11/29/2014   Glaucoma    Hemoptysis    Hypothyroidism    Left bundle branch block 03/16/2005   Oxygen deficiency    patient was using oxygen at home, her pulmonologist discontinued it and patient  stopped using it on Monday Mar 20, 2015   Personal history of chemotherapy    Personal history of radiation therapy    Restless leg syndrome    Sinusitis    Vertigo     Past Surgical History:  Procedure Laterality Date   ABDOMINAL HYSTERECTOMY     APPENDECTOMY  2009   BREAST CYST ASPIRATION Right 06/12/2016   BREAST LUMPECTOMY WITH RADIOACTIVE SEED AND SENTINEL LYMPH NODE BIOPSY Bilateral 02/04/2020   Procedure: BILATERAL BREAST LUMPECTOMY WITH RADIOACTIVE SEED , RIGHT X 2, LEFT X 1 AND RIGHT SENTINEL LYMPH NODE MAPPING;  Surgeon: Erroll Luna, MD;  Location: [place];  Service: General;  Laterality: Bilateral;   RE-EXCISION OF BREAST LUMPECTOMY Bilateral 03/03/2020   Procedure: RE-EXCISION BILATERAL BREAST LUMPECTOMY;  Surgeon: Erroll Luna, MD;  Location: [place];  Service: General;  Laterality: Bilateral;   VESICOVAGINAL FISTULA CLOSURE W/ TAH  1992     Home Medications:  Prior to Admission medications   Medication Sig Start Date End Date Taking? Authorizing Provider  albuterol (VENTOLIN HFA) 108 (90 Base) MCG/ACT inhaler Inhale 2 puffs into the lungs every 6 (six) hours as needed for wheezing or shortness of breath. 11/27/18  Yes Young, Tarri Fuller D, MD  anastrozole (ARIMIDEX) 1 MG tablet Take 1 tablet (1 mg total) by mouth daily. 02/03/21  Yes Nicholas Lose, MD  azelastine (ASTELIN) 0.1 % nasal spray 1-2 puffs each nostril twice daily if needed Patient  taking differently: Place 1-2 sprays into both nostrils daily as needed for rhinitis. 11/26/17  Yes Young, Tarri Fuller D, MD  benzonatate (TESSALON) 200 MG capsule Take 1 capsule (200 mg total) by mouth 3 (three) times daily as needed for cough. 05/27/20  Yes Young, Tarri Fuller D, MD  Cholecalciferol (VITAMIN D) 50 MCG (2000 UT) tablet Take 2,000 Units by mouth daily.   Yes [provider]  Coenzyme Q10 (COQ-10) 100 MG CAPS Take 100 mg by mouth daily.   Yes [provider]  EUTHYROX 75 MCG tablet Take 75 mcg by  mouth every morning. 10/11/21  Yes [provider]  ferrous sulfate 325 (65 FE) MG tablet Take 325 mg by mouth daily with breakfast.   Yes [provider]  fluticasone (FLONASE) 50 MCG/ACT nasal spray Place 2 sprays into both nostrils daily. Patient taking differently: Place 2 sprays into both nostrils daily as needed for allergies. 07/29/13  Yes Campbell Riches, MD  Fluticasone-Umeclidin-Vilant (TRELEGY ELLIPTA) 100-62.5-25 MCG/ACT AEPB Inhale 1 puff into the lungs daily. Patient taking differently: Inhale 1 puff into the lungs daily as needed (sob/wheezing). 02/16/21  Yes Young, Tarri Fuller D, MD  folic acid (FOLVITE) 638 MCG tablet Take 400 mcg by mouth daily.   Yes [provider]  Javier Docker Oil 350 MG CAPS Take 350 mg by mouth daily.   Yes [provider]  latanoprost (XALATAN) 0.005 % ophthalmic solution Place 1 drop into both eyes at bedtime.   Yes [provider]  levothyroxine (EUTHYROX) 75 MCG tablet Take 75 mcg by mouth daily before breakfast.   Yes [provider]  Multiple Minerals (CALCIUM/MAGNESIUM/ZINC) TABS Take 3 tablets by mouth at bedtime.   Yes [provider]  Multiple Vitamin (MULTIVITAMIN) tablet Take 1 tablet by mouth daily.   Yes [provider]  Naproxen Sodium 220 MG CAPS Take 440 mg by mouth daily as needed (pain).   Yes [provider]  NONFORMULARY OR COMPOUNDED ITEM  Ap[facility] - Antifungal topical - Terbinafine 3%, Fluconazole 2%, Tea Tree Oil 5%, Urea 10%, Ibuprofen 2%, + Itraconazole 3%, in #61m DMSO suspension. Apply to affected toenail(s) once at bedtime or twice daily. Patient taking differently: Apply 1 application  topically daily as needed (Toe fungus). [facility]- Antifungal topical - Terbinafine 3%, Fluconazole 2%, Tea Tree Oil 5%, Urea 10%, Ibuprofen 2%, + Itraconazole 3%, in #37mDMSO suspension. Apply to affected toenail(s) once at bedtime or twice daily. 10/28/18   Yes WaTrula SladeDPM  pyridOXINE (VITAMIN B6) 100 MG tablet Take 100 mg by mouth daily.   Yes [provider]  venlafaxine XR (EFFEXOR-XR) 37.5 MG 24 hr capsule Take 37.5 mg by mouth daily. 09/28/21  Yes [provider]  vitamin C (ASCORBIC ACID) 500 MG tablet Take 500 mg by mouth daily.   Yes [provider]  vitamin E 400 UNIT capsule Take 400 Units by mouth daily.   Yes [provider]    Inpatient Medications: Scheduled Meds:  anastrozole  1 mg Oral Daily   ferrous sulfate  325 mg Oral Q breakfast   fluticasone furoate-vilanterol  1 puff Inhalation Daily   And   umeclidinium bromide  1 puff Inhalation Daily   latanoprost  1 drop Both Eyes QHS   levothyroxine  75 mcg Oral Q0600   multivitamin with minerals  1 tablet Oral Daily   venlafaxine XR  37.5 mg Oral Daily   Continuous Infusions:  heparin 750 Units/hr (10/21/21 2308)  PRN Meds: acetaminophen, albuterol, ondansetron (ZOFRAN) IV  Allergies:    Allergies  Allergen Reactions   Augmentin [Amoxicillin-Pot Clavulanate] Nausea And Vomiting    .Marland KitchenHas patient had a PCN reaction causing immediate rash, facial/tongue/throat swelling, SOB or lightheadedness with hypotension: No Has patient had a PCN reaction causing severe rash involving mucus membranes or skin necrosis: No Has patient had a PCN reaction that required hospitalization No Has patient had a PCN reaction occurring within the last 10 years: No If all of the above answers are "NO", then may proceed with Cephalosporin use.    Black Cohosh Nausea And Vomiting    Severe GI Upset, sweats   Codeine Nausea And Vomiting    Severe GI upset, sweats   Hyoscyamine Other (See Comments)    Cramps, made urinary symptoms worse   Oxybutynin Chloride Other (See Comments)    Cramps, made urinary symptoms worse   Minocycline Other (See Comments)    Scratchy tongue, swollen lips    Social History:   Social History   Socioeconomic History    Marital status: Single    Spouse name: Not on file   Number of children: Not on file   Years of education: Not on file   Highest education level: Not on file  Occupational History   Occupation: retired    Fish farm manager: [employer]  Tobacco Use   Smoking status: Never   Smokeless tobacco: Never  Vaping Use   Vaping Use: Never used  Substance and Sexual Activity   Alcohol use: No    Alcohol/week: 0.0 standard drinks of alcohol   Drug use: No   Sexual activity: Not on file  Other Topics Concern   Not on file  Social History Narrative   Not on file   Social Determinants of Health   Financial Resource Strain: Not on file  Food Insecurity: Not on file  Transportation Needs: Not on file  Physical Activity: Not on file  Stress: Not on file  Social Connections: Not on file  Intimate Partner Violence: Not At Risk (03/29/2020)   Humiliation, Afraid, Rape, and Kick questionnaire    Fear of Current or Ex-Partner: No    Emotionally Abused: No    Physically Abused: No    Sexually Abused: No    Family History:   + for CAD/MI in family members Family History  Problem Relation Age of Onset   Emphysema Mother        smoker   Heart disease Mother    COPD Mother    Colon polyps Mother    Emphysema Father        smoker   Heart disease Father    Breast cancer Sister    Colon cancer Neg Hx    Esophageal cancer Neg Hx    Stomach cancer Neg Hx    Rectal cancer Neg Hx      ROS:  Please see the history of present illness.   All other ROS reviewed and negative.     Physical Exam/Data:   Vitals:   10/21/21 1815 10/21/21 2135 10/21/21 2213 10/22/21 0304  BP: 122/72 134/68 (!) 145/78 (!) 140/74  Pulse: 83 88 78 91  Resp: '14 16 16 16  '$ Temp:  98 F (36.7 C) 97.7 F (36.5 C) 97.6 F (36.4 C)  TempSrc:   Oral Oral  SpO2: 92% 95% 100% 97%  Weight:   64.1 kg   Height:   '5\' 8"'$  (1.727 m)     Intake/Output Summary (Last 24  hours) at 10/22/2021 0332 Last data filed at 10/22/2021  0313 Gross per 24 hour  Intake 538.34 ml  Output 350 ml  Net 188.34 ml      10/21/2021   10:13 PM 10/21/2021    5:48 PM 05/30/2021   10:33 AM  Last 3 Weights  Weight (lbs) 141 lb 5 oz 140 lb 140 lb 6.4 oz  Weight (kg) 64.1 kg 63.504 kg 63.685 kg     Body mass index is 21.49 kg/m.  General:  Well nourished, well developed, in no acute distress HEENT: normal Neck: no JVD Vascular: No carotid bruits; Distal pulses 2+ bilaterally Cardiac:  normal S1, S2; RRR; no murmur  Lungs:  clear to auscultation bilaterally, no wheezing, rhonchi or rales  Abd: soft, nontender, no hepatomegaly  Ext: no edema Musculoskeletal:  No deformities, BUE and BLE strength normal and equal Skin: warm and dry  Neuro:  CNs 2-12 intact, no focal abnormalities noted Psych:  Normal affect    Laboratory Data:  High Sensitivity Troponin:   Recent Labs  Lab 10/21/21 1747 10/21/21 2054  TROPONINIHS 43* 794*     Chemistry Recent Labs  Lab 10/21/21 1747  NA 141  K 3.3*  CL 107  CO2 27  GLUCOSE 148*  BUN 9  CREATININE 0.92  CALCIUM 9.3  GFRNONAA >60  ANIONGAP 7    No results for input(s): "PROT", "ALBUMIN", "AST", "ALT", "ALKPHOS", "BILITOT" in the last 168 hours. Lipids No results for input(s): "CHOL", "TRIG", "HDL", "LABVLDL", "LDLCALC", "CHOLHDL" in the last 168 hours.  Hematology Recent Labs  Lab 10/21/21 1747  WBC 5.1  RBC 4.41  HGB 14.2  HCT 43.1  MCV 97.7  MCH 32.2  MCHC 32.9  RDW 13.0  PLT 184   Thyroid  Recent Labs  Lab 10/21/21 1748  TSH 3.212    BNPNo results for input(s): "BNP", "PROBNP" in the last 168 hours.  DDimer  Recent Labs  Lab 10/21/21 1807  DDIMER 0.45     Radiology/Studies:  DG Chest 2 View  Result Date: 10/21/2021 CLINICAL DATA:  Right chest pain, palpitations EXAM: CHEST - 2 VIEW COMPARISON:  Previous studies including the examination of 05/30/2021 FINDINGS: Cardiac size is within normal limits. There are no signs of pulmonary edema or focal  pulmonary consolidation. Linear densities in right parahilar region appears stable suggesting scarring. Small linear density in left lower lung field near the diaphragm has not changed. Small blebs are seen in both apices with no change. IMPRESSION: There are no new infiltrates or signs of pulmonary edema. Electronically Signed   By: Elmer Picker M.D.   On: 10/21/2021 18:35     Assessment and Plan:   Chest pain/NSTEMI (trop 43->794) UTI+ Chronic LBBB Comorbidities: COPD, HTN, h/o breast cancer s/p resuction and radiation therapy  Plan: - admitted under medicine - agree with IV heparin gtt, continue aspirin '81mg'$  daily, begin metoprolol tar '25mg'$  bid, add atorvastatin '80mg'$  daily  - check lipid panel, a1c and other labs, obtain ECHO. - UTI +: treatment per primary team  - NPO on Monday night for LHC on Tuesday morning.    Risk Assessment/Risk Scores:     TIMI Risk Score for Unstable Angina or Non-ST Elevation MI:   The patient's TIMI risk score is 5, which indicates a 26% risk of all cause mortality, new or recurrent myocardial infarction or need for urgent revascularization in the next 14 days.          For questions or updates, please  contact [place] Please consult [email or web address] for contact info under    Signed, Renae Fickle, MD  10/22/2021 3:32 AM

## 2021-10-22 NOTE — Progress Notes (Signed)
ANTICOAGULATION CONSULT NOTE - Initial Consult  Pharmacy Consult for heparin Indication: chest pain/ACS  Allergies  Allergen Reactions   Augmentin [Amoxicillin-Pot Clavulanate] Nausea And Vomiting    .Marland KitchenHas patient had a PCN reaction causing immediate rash, facial/tongue/throat swelling, SOB or lightheadedness with hypotension: No Has patient had a PCN reaction causing severe rash involving mucus membranes or skin necrosis: No Has patient had a PCN reaction that required hospitalization No Has patient had a PCN reaction occurring within the last 10 years: No If all of the above answers are "NO", then may proceed with Cephalosporin use.    Black Cohosh Nausea And Vomiting    Severe GI Upset, sweats   Codeine Nausea And Vomiting    Severe GI upset, sweats   Hyoscyamine Other (See Comments)    Cramps, made urinary symptoms worse   Oxybutynin Chloride Other (See Comments)    Cramps, made urinary symptoms worse   Minocycline Other (See Comments)    Scratchy tongue, swollen lips    Patient Measurements: Height: '5\' 8"'$  (172.7 cm) Weight: 64.1 kg (141 lb 5 oz) IBW/kg (Calculated) : 63.9  Vital Signs: Temp: 97.6 F (36.4 C) (09/03 1115) Temp Source: Oral (09/03 1115) BP: 121/64 (09/03 1115) Pulse Rate: 77 (09/03 1115)  Labs: Recent Labs    10/21/21 1747 10/21/21 2054 10/22/21 0557 10/22/21 0609 10/22/21 0908 10/22/21 1533  HGB 14.2  --  14.8  --   --   --   HCT 43.1  --  45.6  --   --   --   PLT 184  --  189  --   --   --   HEPARINUNFRC  --   --  0.44  --   --  0.32  CREATININE 0.92  --   --   --  0.77  --   TROPONINIHS 43* 794*  --  698* 552*  --      Estimated Creatinine Clearance: 54.7 mL/min (by C-G formula based on SCr of 0.77 mg/dL).   Medical History: Past Medical History:  Diagnosis Date   Allergy    Anemia    Breast cancer ([place])    Cataract    COPD (chronic obstructive pulmonary disease) ([place])    Depression    Essential hypertension 11/29/2014    Glaucoma    Hemoptysis    Hypothyroidism    Left bundle branch block 03/16/2005   Oxygen deficiency    patient was using oxygen at home, her pulmonologist discontinued it and patient stopped using it on Monday Mar 20, 2015   Personal history of chemotherapy    Personal history of radiation therapy    Restless leg syndrome    Sinusitis    Vertigo     Medications:  Facility-Administered Medications Prior to Admission  Medication Dose Route Frequency Provider Last Rate Last Admin   [DISCONTINUED] 0.9 %  sodium chloride infusion  500 mL Intravenous Continuous Danis, Estill Cotta III, MD       Medications Prior to Admission  Medication Sig Dispense Refill Last Dose   albuterol (VENTOLIN HFA) 108 (90 Base) MCG/ACT inhaler Inhale 2 puffs into the lungs every 6 (six) hours as needed for wheezing or shortness of breath. 8 g 12 unknown   anastrozole (ARIMIDEX) 1 MG tablet Take 1 tablet (1 mg total) by mouth daily. 90 tablet 3 10/20/2021   azelastine (ASTELIN) 0.1 % nasal spray 1-2 puffs each nostril twice daily if needed (Patient taking differently: Place 1-2 sprays into both nostrils daily  as needed for rhinitis.) 30 mL 12 unknown   benzonatate (TESSALON) 200 MG capsule Take 1 capsule (200 mg total) by mouth 3 (three) times daily as needed for cough. 30 capsule 1 unknown   Cholecalciferol (VITAMIN D) 50 MCG (2000 UT) tablet Take 2,000 Units by mouth daily.   10/20/2021   Coenzyme Q10 (COQ-10) 100 MG CAPS Take 100 mg by mouth daily.   10/20/2021   EUTHYROX 75 MCG tablet Take 75 mcg by mouth every morning.   10/21/2021   ferrous sulfate 325 (65 FE) MG tablet Take 325 mg by mouth daily with breakfast.   10/20/2021   fluticasone (FLONASE) 50 MCG/ACT nasal spray Place 2 sprays into both nostrils daily. (Patient taking differently: Place 2 sprays into both nostrils daily as needed for allergies.) 15 g 2 unknown   Fluticasone-Umeclidin-Vilant (TRELEGY ELLIPTA) 100-62.5-25 MCG/ACT AEPB Inhale 1 puff into the lungs  daily. (Patient taking differently: Inhale 1 puff into the lungs daily as needed (sob/wheezing).) 60 each 6 unknown   folic acid (FOLVITE) 132 MCG tablet Take 400 mcg by mouth daily.   10/20/2021   Krill Oil 350 MG CAPS Take 350 mg by mouth daily.   10/20/2021   latanoprost (XALATAN) 0.005 % ophthalmic solution Place 1 drop into both eyes at bedtime.   10/20/2021   levothyroxine (EUTHYROX) 75 MCG tablet Take 75 mcg by mouth daily before breakfast.   10/21/2021   Multiple Minerals (CALCIUM/MAGNESIUM/ZINC) TABS Take 3 tablets by mouth at bedtime.   10/20/2021   Multiple Vitamin (MULTIVITAMIN) tablet Take 1 tablet by mouth daily.   10/20/2021   Naproxen Sodium 220 MG CAPS Take 440 mg by mouth daily as needed (pain).   unknown   NONFORMULARY OR COMPOUNDED ITEM [facility] - Antifungal topical - Terbinafine 3%, Fluconazole 2%, Tea Tree Oil 5%, Urea 10%, Ibuprofen 2%, + Itraconazole 3%, in #10m DMSO suspension. Apply to affected toenail(s) once at bedtime or twice daily. (Patient taking differently: Apply 1 application  topically daily as needed (Toe fungus). [facility]- Antifungal topical - Terbinafine 3%, Fluconazole 2%, Tea Tree Oil 5%, Urea 10%, Ibuprofen 2%, + Itraconazole 3%, in #380mDMSO suspension. Apply to affected toenail(s) once at bedtime or twice daily.) 30 each 11 unknown   pyridOXINE (VITAMIN B6) 100 MG tablet Take 100 mg by mouth daily.   10/20/2021   venlafaxine XR (EFFEXOR-XR) 37.5 MG 24 hr capsule Take 37.5 mg by mouth daily.   10/20/2021   vitamin C (ASCORBIC ACID) 500 MG tablet Take 500 mg by mouth daily.   10/20/2021   vitamin E 400 UNIT capsule Take 400 Units by mouth daily.   10/20/2021   Scheduled:   anastrozole  1 mg Oral Daily   [START ON 10/24/2021] aspirin  81 mg Oral Pre-Cath   aspirin EC  81 mg Oral Daily   [START ON 10/25/2021] aspirin EC  81 mg Oral Daily   atorvastatin  80 mg Oral Daily   ferrous sulfate  325 mg Oral Q breakfast   fluticasone furoate-vilanterol  1 puff  Inhalation Daily   And   umeclidinium bromide  1 puff Inhalation Daily   latanoprost  1 drop Both Eyes QHS   levothyroxine  75 mcg Oral Q0600   metoprolol tartrate  25 mg Oral BID   multivitamin with minerals  1 tablet Oral Daily   sodium chloride flush  3 mL Intravenous Q12H   venlafaxine XR  37.5 mg Oral Daily    Assessment: [id number]  c/o non-radiating right-sided CP/palpitations, pain subsided after NTG x1, troponin elevated--pharmacy consulted to start heparin. LHC planned for Tuesday, 9/5.  Heparin level today is therapeutic at 0.44, on 750 units/hr. Hgb 14.8, plt 189. No line issues or signs/symptoms of bleeding reported. Troponin decreased from 794>>698 since 9/2 evening.   Heparin level is therapeutic again but slightly lower. We will bump up rate and follow up in Am.   Goal of Therapy:  Heparin level 0.3-0.7 units/ml Monitor platelets by anticoagulation protocol: Yes   Plan:  Increase heparin rate to 800 units/hr. Monitor CBC, heparin level, signs/symptoms of bleeding daily.    Onnie Boer, PharmD, BCIDP, AAHIVP, CPP Infectious Disease Pharmacist 10/22/2021 4:29 PM

## 2021-10-22 NOTE — Progress Notes (Signed)
PROGRESS NOTE    [name]  KXF:[id number] DOB: Jun 02, 1940 DOA: 10/21/2021 PCP: Jonathon Jordan, MD    Chief Complaint  Patient presents with   Chest Pain    Brief Narrative:  No notes on file    Assessment & Plan:  Principal Problem:   Chest pain, rule out acute myocardial infarction Active Problems:   NSTEMI (non-ST elevated myocardial infarction) ([place])   Bronchiectasis without acute exacerbation + MAIC   Essential hypertension   Malignant neoplasm of upper-outer quadrant of right breast in female, estrogen receptor positive ([place])   Hypothyroidism   UTI (urinary tract infection)    Assessment and Plan: * Chest pain, rule out acute myocardial infarction Non-STEMI.   -On admission initial concern for unstable angina.  DDx includes pulmonary dz, though no worsening SOB, no other pulm symptoms worse at this time; malignancy (recurrent breast CA) as cause of CP. PE unlikely given neg d.dimer. Patient ruled in with elevated cardiac enzymes. -2D echo ordered with septal, apical and inferior apical hypokinesis with left ventricular EF of 45 to 50%, left ventricular internal cavity size mildly dilated, mild LVH, grade 1 diastolic dysfunction. -Patient started on heparin drip. -Patient seen in consultation by cardiology and patient scheduled for cardiac catheterization on Tuesday, 10/24/2021.  NSTEMI (non-ST elevated myocardial infarction) ([place]) - See chest pain above.  Malignant neoplasm of upper-outer quadrant of right breast in female, estrogen receptor positive ([place])  Continue anastrozole Outpatient follow-up.  Essential hypertension Listed in chart but pt denies history of. Not on any meds prior to admission. -Patient started on Lopressor per cardiology due to non-STEMI. -Follow BP.  Bronchiectasis without acute exacerbation + MAIC Complex pulm history, MAIC followed by [place] -> COPD. But no new cough, SOB.  CXR today doesn't show any new infiltrates,  etc. Cont home inhalers  UTI (urinary tract infection) - Urine cultures pending. -Continue IV Rocephin.  Hypothyroidism - Continue home regimen Synthroid.         DVT prophylaxis: Heparin Code Status: Full Family Communication: Updated patient.  No family at bedside. Disposition: To be determined  Status is: Inpatient    Consultants:  Cardiology: Dr. Rudi Rummage 10/22/2021  Procedures:  Chest x-ray 10/21/2021 2D echo 10/22/2021  Antimicrobials:  IV Rocephin 10/22/2021>>>>>   Subjective: Laying in bed on the telephone.  Denies any further chest pain.  Does endorse some palpitations prior to admission.  Denies any shortness of breath.  No abdominal pain.  Asking whether she could go home and come back for her cath.  Overall feeling better.  Objective: Vitals:   10/21/21 2213 10/22/21 0304 10/22/21 0831 10/22/21 1115  BP: (!) 145/78 (!) 140/74  121/64  Pulse: 78 91  77  Resp: '16 16  15  '$ Temp: 97.7 F (36.5 C) 97.6 F (36.4 C)  97.6 F (36.4 C)  TempSrc: Oral Oral  Oral  SpO2: 100% 97% 95% 94%  Weight: 64.1 kg     Height: '5\' 8"'$  (1.727 m)       Intake/Output Summary (Last 24 hours) at 10/22/2021 1629 Last data filed at 10/22/2021 1300 Gross per 24 hour  Intake 778.34 ml  Output 1300 ml  Net -521.66 ml   Filed Weights   10/21/21 1748 10/21/21 2213  Weight: 63.5 kg 64.1 kg    Examination:  General exam: Appears calm and comfortable  Respiratory system: Clear to auscultation. Respiratory effort normal. Cardiovascular system: S1 & S2 heard, RRR. No JVD, murmurs, rubs, gallops or clicks. No pedal edema. Gastrointestinal  system: Abdomen is nondistended, soft and nontender. No organomegaly or masses felt. Normal bowel sounds heard. Central nervous system: Alert and oriented. No focal neurological deficits. Extremities: Symmetric 5 x 5 power. Skin: No rashes, lesions or ulcers Psychiatry: Judgement and insight appear normal. Mood & affect appropriate.     Data  Reviewed:   CBC: Recent Labs  Lab 10/21/21 1747 10/22/21 0557  WBC 5.1 5.5  HGB 14.2 14.8  HCT 43.1 45.6  MCV 97.7 97.2  PLT 184 381    Basic Metabolic Panel: Recent Labs  Lab 10/21/21 1747 10/22/21 0908  NA 141 143  K 3.3* 3.4*  CL 107 104  CO2 27 27  GLUCOSE 148* 152*  BUN 9 7*  CREATININE 0.92 0.77  CALCIUM 9.3 9.3  MG  --  2.1    GFR: Estimated Creatinine Clearance: 54.7 mL/min (by C-G formula based on SCr of 0.77 mg/dL).  Liver Function Tests: No results for input(s): "AST", "ALT", "ALKPHOS", "BILITOT", "PROT", "ALBUMIN" in the last 168 hours.  CBG: No results for input(s): "GLUCAP" in the last 168 hours.   No results found for this or any previous visit (from the past 240 hour(s)).       Radiology Studies: ECHOCARDIOGRAM COMPLETE  Result Date: 10/22/2021    ECHOCARDIOGRAM REPORT   Patient Name:   [name] Date of Exam: 10/22/2021 Medical Rec #:  [id number]            Height:       68.0 in Accession #:    [id number]           Weight:       141.3 lb Date of Birth:  [birth year]             BSA:          1.763 m Patient Age:    81 years             BP:           140/74 mmHg Patient Gender: F                    HR:           109 bpm. Exam Location:  Inpatient Procedure: 2D Echo, Cardiac Doppler, Color Doppler and Intracardiac            Opacification Agent Indications:    Chest pain  History:        Patient has no prior history of Echocardiogram examinations.                 COPD; Risk Factors:Hypertension.  Sonographer:    Clayton Lefort RDCS (AE) Referring Phys: [place]  1. Septal, apical and inferior apical hypokinesis . Left ventricular ejection fraction, by estimation, is 45 to 50%. The left ventricle has mildly decreased function. The left ventricle has no regional wall motion abnormalities. The left ventricular internal cavity size was mildly dilated. There is mild left ventricular hypertrophy. Left ventricular diastolic parameters  are consistent with Grade I diastolic dysfunction (impaired relaxation).  2. Right ventricular systolic function is normal. The right ventricular size is normal.  3. The mitral valve is abnormal. Trivial mitral valve regurgitation. No evidence of mitral stenosis. Moderate mitral annular calcification.  4. The aortic valve is tricuspid. There is mild calcification of the aortic valve. There is mild thickening of the aortic valve. Aortic valve regurgitation is mild. Aortic valve sclerosis is present, with no evidence of  aortic valve stenosis.  5. The inferior vena cava is normal in size with greater than 50% respiratory variability, suggesting right atrial pressure of 3 mmHg. FINDINGS  Left Ventricle: Septal, apical and inferior apical hypokinesis. Left ventricular ejection fraction, by estimation, is 45 to 50%. The left ventricle has mildly decreased function. The left ventricle has no regional wall motion abnormalities. Definity contrast agent was given IV to delineate the left ventricular endocardial borders. The left ventricular internal cavity size was mildly dilated. There is mild left ventricular hypertrophy. Left ventricular diastolic parameters are consistent with Grade I  diastolic dysfunction (impaired relaxation). Right Ventricle: The right ventricular size is normal. No increase in right ventricular wall thickness. Right ventricular systolic function is normal. Left Atrium: Left atrial size was normal in size. Right Atrium: Right atrial size was normal in size. Pericardium: There is no evidence of pericardial effusion. Mitral Valve: The mitral valve is abnormal. There is mild thickening of the mitral valve leaflet(s). There is mild calcification of the mitral valve leaflet(s). Moderate mitral annular calcification. Trivial mitral valve regurgitation. No evidence of mitral valve stenosis. Tricuspid Valve: The tricuspid valve is normal in structure. Tricuspid valve regurgitation is not demonstrated. No  evidence of tricuspid stenosis. Aortic Valve: The aortic valve is tricuspid. There is mild calcification of the aortic valve. There is mild thickening of the aortic valve. Aortic valve regurgitation is mild. Aortic valve sclerosis is present, with no evidence of aortic valve stenosis. Aortic valve mean gradient measures 2.0 mmHg. Aortic valve peak gradient measures 3.2 mmHg. Aortic valve area, by VTI measures 2.02 cm. Pulmonic Valve: The pulmonic valve was normal in structure. Pulmonic valve regurgitation is trivial. No evidence of pulmonic stenosis. Aorta: The aortic root is normal in size and structure. Venous: The inferior vena cava is normal in size with greater than 50% respiratory variability, suggesting right atrial pressure of 3 mmHg. IAS/Shunts: No atrial level shunt detected by color flow Doppler.  LEFT VENTRICLE PLAX 2D LVIDd:         4.00 cm   Diastology LVIDs:         2.90 cm   LV e' lateral: 5.33 cm/s LV PW:         1.30 cm LV IVS:        1.30 cm LVOT diam:     2.00 cm LV SV:         32 LV SV Index:   18 LVOT Area:     3.14 cm  IVC IVC diam: 0.90 cm LEFT ATRIUM             Index LA diam:        2.70 cm 1.53 cm/m LA Vol (A2C):   39.1 ml 22.17 ml/m LA Vol (A4C):   31.6 ml 17.92 ml/m LA Biplane Vol: 38.9 ml 22.06 ml/m  AORTIC VALVE AV Area (Vmax):    2.05 cm AV Area (Vmean):   2.03 cm AV Area (VTI):     2.02 cm AV Vmax:           89.10 cm/s AV Vmean:          57.000 cm/s AV VTI:            0.157 m AV Peak Grad:      3.2 mmHg AV Mean Grad:      2.0 mmHg LVOT Vmax:         58.20 cm/s LVOT Vmean:        36.900 cm/s LVOT VTI:  0.101 m LVOT/AV VTI ratio: 0.64  AORTA Ao Root diam: 2.90 cm Ao Asc diam:  3.00 cm  SHUNTS Systemic VTI:  0.10 m Systemic Diam: 2.00 cm Jenkins Rouge MD Electronically signed by Jenkins Rouge MD Signature Date/Time: 10/22/2021/10:46:27 AM    Final    DG Chest 2 View  Result Date: 10/21/2021 CLINICAL DATA:  Right chest pain, palpitations EXAM: CHEST - 2 VIEW COMPARISON:   Previous studies including the examination of 05/30/2021 FINDINGS: Cardiac size is within normal limits. There are no signs of pulmonary edema or focal pulmonary consolidation. Linear densities in right parahilar region appears stable suggesting scarring. Small linear density in left lower lung field near the diaphragm has not changed. Small blebs are seen in both apices with no change. IMPRESSION: There are no new infiltrates or signs of pulmonary edema. Electronically Signed   By: Elmer Picker M.D.   On: 10/21/2021 18:35        Scheduled Meds:  anastrozole  1 mg Oral Daily   [START ON 10/24/2021] aspirin  81 mg Oral Pre-Cath   aspirin EC  81 mg Oral Daily   [START ON 10/25/2021] aspirin EC  81 mg Oral Daily   atorvastatin  80 mg Oral Daily   ferrous sulfate  325 mg Oral Q breakfast   fluticasone furoate-vilanterol  1 puff Inhalation Daily   And   umeclidinium bromide  1 puff Inhalation Daily   latanoprost  1 drop Both Eyes QHS   levothyroxine  75 mcg Oral Q0600   metoprolol tartrate  25 mg Oral BID   multivitamin with minerals  1 tablet Oral Daily   sodium chloride flush  3 mL Intravenous Q12H   venlafaxine XR  37.5 mg Oral Daily   Continuous Infusions:  sodium chloride     [START ON 10/24/2021] sodium chloride     Followed by   Derrill Memo ON 10/24/2021] sodium chloride     cefTRIAXone (ROCEPHIN)  IV 1 g (10/22/21 0840)   heparin 750 Units/hr (10/21/21 2308)     LOS: 0 days    Time spent: 35 minutes    Irine Seal, MD [facility]   To contact the attending provider between 7A-7P or the covering provider during after hours 7P-7A, please log into the web site [email or web address] and access using universal [place] password for that web site. If you do not have the password, please call the hospital operator.  10/22/2021, 4:29 PM

## 2021-10-22 NOTE — Assessment & Plan Note (Signed)
-   See chest pain above.

## 2021-10-22 NOTE — Progress Notes (Addendum)
Pt reporting dysuria to RN.  Obtained UA.  Urinalysis    Component Value Date/Time   COLORURINE YELLOW 10/22/2021 0434   APPEARANCEUR HAZY (A) 10/22/2021 0434   LABSPEC 1.004 (L) 10/22/2021 0434   PHURINE 7.0 10/22/2021 0434   GLUCOSEU NEGATIVE 10/22/2021 0434   HGBUR NEGATIVE 10/22/2021 0434   BILIRUBINUR NEGATIVE 10/22/2021 0434   KETONESUR NEGATIVE 10/22/2021 0434   PROTEINUR NEGATIVE 10/22/2021 0434   UROBILINOGEN 0.2 12/08/2007 2306   NITRITE POSITIVE (A) 10/22/2021 0434   LEUKOCYTESUR LARGE (A) 10/22/2021 0434    UA is suggestive of UTI. Will order urine culture and start rocephin empirically. Also will let pt eat, cards indicating NPO after MN Monday for LHC on Tues.

## 2021-10-22 NOTE — Assessment & Plan Note (Signed)
Synthroid 

## 2021-10-22 NOTE — Progress Notes (Signed)
ANTICOAGULATION CONSULT NOTE - Initial Consult  Pharmacy Consult for heparin Indication: chest pain/ACS  Allergies  Allergen Reactions   Augmentin [Amoxicillin-Pot Clavulanate] Nausea And Vomiting    .Marland KitchenHas patient had a PCN reaction causing immediate rash, facial/tongue/throat swelling, SOB or lightheadedness with hypotension: No Has patient had a PCN reaction causing severe rash involving mucus membranes or skin necrosis: No Has patient had a PCN reaction that required hospitalization No Has patient had a PCN reaction occurring within the last 10 years: No If all of the above answers are "NO", then may proceed with Cephalosporin use.    Black Cohosh Nausea And Vomiting    Severe GI Upset, sweats   Codeine Nausea And Vomiting    Severe GI upset, sweats   Hyoscyamine Other (See Comments)    Cramps, made urinary symptoms worse   Oxybutynin Chloride Other (See Comments)    Cramps, made urinary symptoms worse   Minocycline Other (See Comments)    Scratchy tongue, swollen lips    Patient Measurements: Height: '5\' 8"'$  (172.7 cm) Weight: 64.1 kg (141 lb 5 oz) IBW/kg (Calculated) : 63.9  Vital Signs: Temp: 97.6 F (36.4 C) (09/03 0304) Temp Source: Oral (09/03 0304) BP: 140/74 (09/03 0304) Pulse Rate: 91 (09/03 0304)  Labs: Recent Labs    10/21/21 1747 10/21/21 2054 10/22/21 0557 10/22/21 0609  HGB 14.2  --  14.8  --   HCT 43.1  --  45.6  --   PLT 184  --  189  --   HEPARINUNFRC  --   --  0.44  --   CREATININE 0.92  --   --   --   TROPONINIHS 43* 794*  --  698*     Estimated Creatinine Clearance: 47.6 mL/min (by C-G formula based on SCr of 0.92 mg/dL).   Medical History: Past Medical History:  Diagnosis Date   Allergy    Anemia    Breast cancer ([place])    Cataract    COPD (chronic obstructive pulmonary disease) ([place])    Depression    Essential hypertension 11/29/2014   Glaucoma    Hemoptysis    Hypothyroidism    Left bundle branch block 03/16/2005   Oxygen  deficiency    patient was using oxygen at home, her pulmonologist discontinued it and patient stopped using it on Monday Mar 20, 2015   Personal history of chemotherapy    Personal history of radiation therapy    Restless leg syndrome    Sinusitis    Vertigo     Medications:  Facility-Administered Medications Prior to Admission  Medication Dose Route Frequency Provider Last Rate Last Admin   [DISCONTINUED] 0.9 %  sodium chloride infusion  500 mL Intravenous Continuous Danis, Estill Cotta III, MD       Medications Prior to Admission  Medication Sig Dispense Refill Last Dose   albuterol (VENTOLIN HFA) 108 (90 Base) MCG/ACT inhaler Inhale 2 puffs into the lungs every 6 (six) hours as needed for wheezing or shortness of breath. 8 g 12 unknown   anastrozole (ARIMIDEX) 1 MG tablet Take 1 tablet (1 mg total) by mouth daily. 90 tablet 3 10/20/2021   azelastine (ASTELIN) 0.1 % nasal spray 1-2 puffs each nostril twice daily if needed (Patient taking differently: Place 1-2 sprays into both nostrils daily as needed for rhinitis.) 30 mL 12 unknown   benzonatate (TESSALON) 200 MG capsule Take 1 capsule (200 mg total) by mouth 3 (three) times daily as needed for cough. 30 capsule 1  unknown   Cholecalciferol (VITAMIN D) 50 MCG (2000 UT) tablet Take 2,000 Units by mouth daily.   10/20/2021   Coenzyme Q10 (COQ-10) 100 MG CAPS Take 100 mg by mouth daily.   10/20/2021   EUTHYROX 75 MCG tablet Take 75 mcg by mouth every morning.   10/21/2021   ferrous sulfate 325 (65 FE) MG tablet Take 325 mg by mouth daily with breakfast.   10/20/2021   fluticasone (FLONASE) 50 MCG/ACT nasal spray Place 2 sprays into both nostrils daily. (Patient taking differently: Place 2 sprays into both nostrils daily as needed for allergies.) 15 g 2 unknown   Fluticasone-Umeclidin-Vilant (TRELEGY ELLIPTA) 100-62.5-25 MCG/ACT AEPB Inhale 1 puff into the lungs daily. (Patient taking differently: Inhale 1 puff into the lungs daily as needed (sob/wheezing).)  60 each 6 unknown   folic acid (FOLVITE) 295 MCG tablet Take 400 mcg by mouth daily.   10/20/2021   Krill Oil 350 MG CAPS Take 350 mg by mouth daily.   10/20/2021   latanoprost (XALATAN) 0.005 % ophthalmic solution Place 1 drop into both eyes at bedtime.   10/20/2021   levothyroxine (EUTHYROX) 75 MCG tablet Take 75 mcg by mouth daily before breakfast.   10/21/2021   Multiple Minerals (CALCIUM/MAGNESIUM/ZINC) TABS Take 3 tablets by mouth at bedtime.   10/20/2021   Multiple Vitamin (MULTIVITAMIN) tablet Take 1 tablet by mouth daily.   10/20/2021   Naproxen Sodium 220 MG CAPS Take 440 mg by mouth daily as needed (pain).   unknown   NONFORMULARY OR COMPOUNDED ITEM [facility] - Antifungal topical - Terbinafine 3%, Fluconazole 2%, Tea Tree Oil 5%, Urea 10%, Ibuprofen 2%, + Itraconazole 3%, in #21m DMSO suspension. Apply to affected toenail(s) once at bedtime or twice daily. (Patient taking differently: Apply 1 application  topically daily as needed (Toe fungus). [place]- Antifungal topical - Terbinafine 3%, Fluconazole 2%, Tea Tree Oil 5%, Urea 10%, Ibuprofen 2%, + Itraconazole 3%, in #364mDMSO suspension. Apply to affected toenail(s) once at bedtime or twice daily.) 30 each 11 unknown   pyridOXINE (VITAMIN B6) 100 MG tablet Take 100 mg by mouth daily.   10/20/2021   venlafaxine XR (EFFEXOR-XR) 37.5 MG 24 hr capsule Take 37.5 mg by mouth daily.   10/20/2021   vitamin C (ASCORBIC ACID) 500 MG tablet Take 500 mg by mouth daily.   10/20/2021   vitamin E 400 UNIT capsule Take 400 Units by mouth daily.   10/20/2021   Scheduled:   anastrozole  1 mg Oral Daily   aspirin EC  81 mg Oral Daily   atorvastatin  80 mg Oral Daily   ferrous sulfate  325 mg Oral Q breakfast   fluticasone furoate-vilanterol  1 puff Inhalation Daily   And   umeclidinium bromide  1 puff Inhalation Daily   latanoprost  1 drop Both Eyes QHS   levothyroxine  75 mcg Oral Q0600   metoprolol tartrate  25 mg Oral BID   multivitamin  with minerals  1 tablet Oral Daily   venlafaxine XR  37.5 mg Oral Daily    Assessment: [age]yoemale c/o non-radiating right-sided CP/palpitations, pain subsided after NTG x1, troponin elevated--pharmacy consulted to start heparin. LHC planned for Tuesday, 9/5.  Heparin level today is therapeutic at 0.44, on 750 units/hr. Hgb 14.8, plt 189. No line issues or signs/symptoms of bleeding reported. Troponin decreased from 794>>698 since 9/2 evening.   Goal of Therapy:  Heparin level 0.3-0.7 units/ml Monitor platelets by anticoagulation protocol: Yes  Plan:  Continue heparin rate of 750 units/hr. Check heparin level in 8hrs. Monitor CBC, heparin level, signs/symptoms of bleeding daily.    Billey Gosling, PharmD PGY1 Pharmacy Resident 9/3/20237:48 AM

## 2021-10-22 NOTE — Progress Notes (Signed)
Echo attempted. Patient receiving nursing care. Will attempt again as schedule allows.

## 2021-10-23 DIAGNOSIS — R079 Chest pain, unspecified: Secondary | ICD-10-CM | POA: Diagnosis not present

## 2021-10-23 DIAGNOSIS — I10 Essential (primary) hypertension: Secondary | ICD-10-CM | POA: Diagnosis not present

## 2021-10-23 DIAGNOSIS — J479 Bronchiectasis, uncomplicated: Secondary | ICD-10-CM | POA: Diagnosis not present

## 2021-10-23 DIAGNOSIS — E039 Hypothyroidism, unspecified: Secondary | ICD-10-CM | POA: Diagnosis not present

## 2021-10-23 LAB — CBC
HCT: 42.6 % (ref 36.0–46.0)
Hemoglobin: 13.9 g/dL (ref 12.0–15.0)
MCH: 31.3 pg (ref 26.0–34.0)
MCHC: 32.6 g/dL (ref 30.0–36.0)
MCV: 95.9 fL (ref 80.0–100.0)
Platelets: 180 10*3/uL (ref 150–400)
RBC: 4.44 MIL/uL (ref 3.87–5.11)
RDW: 13 % (ref 11.5–15.5)
WBC: 5.2 10*3/uL (ref 4.0–10.5)
nRBC: 0 % (ref 0.0–0.2)

## 2021-10-23 LAB — BASIC METABOLIC PANEL
Anion gap: 6 (ref 5–15)
BUN: 19 mg/dL (ref 8–23)
CO2: 23 mmol/L (ref 22–32)
Calcium: 9.2 mg/dL (ref 8.9–10.3)
Chloride: 108 mmol/L (ref 98–111)
Creatinine, Ser: 0.99 mg/dL (ref 0.44–1.00)
GFR, Estimated: 57 mL/min — ABNORMAL LOW (ref >60)
Glucose, Bld: 104 mg/dL — ABNORMAL HIGH (ref 70–99)
Potassium: 3.9 mmol/L (ref 3.5–5.1)
Sodium: 137 mmol/L (ref 135–145)

## 2021-10-23 LAB — HEPARIN LEVEL (UNFRACTIONATED): Heparin Unfractionated: 0.32 IU/mL (ref 0.30–0.70)

## 2021-10-23 LAB — MAGNESIUM: Magnesium: 2.2 mg/dL (ref 1.7–2.4)

## 2021-10-23 LAB — HEMOGLOBIN A1C
Hgb A1c MFr Bld: 5.8 % — ABNORMAL HIGH (ref 4.8–5.6)
Mean Plasma Glucose: 119.76 mg/dL

## 2021-10-23 NOTE — Progress Notes (Signed)
Rounding Note    Patient Name: [name] Date of Encounter: 10/23/2021  [place] Cardiologist: Elouise Munroe, MD   Subjective   No complaints this morning.  Denies chest pain or dyspnea.  Inpatient Medications    Scheduled Meds:  anastrozole  1 mg Oral Daily   [START ON 10/24/2021] aspirin  81 mg Oral Pre-Cath   aspirin EC  81 mg Oral Daily   [START ON 10/25/2021] aspirin EC  81 mg Oral Daily   atorvastatin  80 mg Oral Daily   ferrous sulfate  325 mg Oral Q breakfast   fluticasone furoate-vilanterol  1 puff Inhalation Daily   And   umeclidinium bromide  1 puff Inhalation Daily   latanoprost  1 drop Both Eyes QHS   levothyroxine  75 mcg Oral Q0600   metoprolol tartrate  25 mg Oral BID   multivitamin with minerals  1 tablet Oral Daily   sodium chloride flush  3 mL Intravenous Q12H   venlafaxine XR  37.5 mg Oral Daily   Continuous Infusions:  sodium chloride     [START ON 10/24/2021] sodium chloride     Followed by   Derrill Memo ON 10/24/2021] sodium chloride     cefTRIAXone (ROCEPHIN)  IV 1 g (10/22/21 0840)   heparin 800 Units/hr (10/23/21 0041)   PRN Meds: sodium chloride, acetaminophen, albuterol, ondansetron (ZOFRAN) IV, sodium chloride flush   Vital Signs    Vitals:   10/22/21 1832 10/22/21 2029 10/23/21 0031 10/23/21 0511  BP: (!) 142/82 124/66 (!) 123/56 131/74  Pulse: 86 76 71 61  Resp: '16 16 16 16  '$ Temp:  98.6 F (37 C) (!) 97.5 F (36.4 C) 97.7 F (36.5 C)  TempSrc:  Oral Oral Oral  SpO2: 95% 95% 93% 94%  Weight:    63.3 kg  Height:        Intake/Output Summary (Last 24 hours) at 10/23/2021 0754 Last data filed at 10/23/2021 0510 Gross per 24 hour  Intake 776.13 ml  Output 450 ml  Net 326.13 ml      10/23/2021    5:11 AM 10/22/2021    5:00 PM 10/21/2021   10:13 PM  Last 3 Weights  Weight (lbs) 139 lb 8.8 oz 140 lb 9.6 oz 141 lb 5 oz  Weight (kg) 63.3 kg 63.776 kg 64.1 kg      Telemetry    ST LBBB - Personally  Reviewed  ECG    ST vs ectopic atrial tachycardia, LBBB - Personally Reviewed  Physical Exam   GEN: No acute distress.   Neck: No JVD Cardiac: RRR, no murmurs, rubs, or gallops.  Respiratory: Clear to auscultation bilaterally. GI: Soft, nontender, non-distended  MS: No edema; No deformity. Neuro:  Nonfocal  Psych: Normal affect   Labs    High Sensitivity Troponin:   Recent Labs  Lab 10/21/21 1747 10/21/21 2054 10/22/21 0609 10/22/21 0908  TROPONINIHS 43* 794* 698* 552*     Chemistry Recent Labs  Lab 10/21/21 1747 10/22/21 0908 10/23/21 0217  NA 141 143 137  K 3.3* 3.4* 3.9  CL 107 104 108  CO2 '27 27 23  '$ GLUCOSE 148* 152* 104*  BUN 9 7* 19  CREATININE 0.92 0.77 0.99  CALCIUM 9.3 9.3 9.2  MG  --  2.1 2.2  GFRNONAA >60 >60 57*  ANIONGAP '7 12 6    '$ Lipids  Recent Labs  Lab 10/22/21 0609  CHOL 196  TRIG 299*  HDL 55  LDLCALC  81  CHOLHDL 3.6    Hematology Recent Labs  Lab 10/21/21 1747 10/22/21 0557 10/23/21 0217  WBC 5.1 5.5 5.2  RBC 4.41 4.69 4.44  HGB 14.2 14.8 13.9  HCT 43.1 45.6 42.6  MCV 97.7 97.2 95.9  MCH 32.2 31.6 31.3  MCHC 32.9 32.5 32.6  RDW 13.0 13.2 13.0  PLT 184 189 180   Thyroid  Recent Labs  Lab 10/22/21 0609  TSH 8.344*    BNPNo results for input(s): "BNP", "PROBNP" in the last 168 hours.  DDimer  Recent Labs  Lab 10/21/21 1807  DDIMER 0.45     Radiology    ECHOCARDIOGRAM COMPLETE  Result Date: 10/22/2021    ECHOCARDIOGRAM REPORT   Patient Name:   [name] Date of Exam: 10/22/2021 Medical Rec #:  [id number]            Height:       68.0 in Accession #:    [id number]           Weight:       141.3 lb Date of Birth:  August 13, 1940             BSA:          1.763 m Patient Age:    81 years             BP:           140/74 mmHg Patient Gender: F                    HR:           109 bpm. Exam Location:  Inpatient Procedure: 2D Echo, Cardiac Doppler, Color Doppler and Intracardiac            Opacification Agent  Indications:    Chest pain  History:        Patient has no prior history of Echocardiogram examinations.                 COPD; Risk Factors:Hypertension.  Sonographer:    Clayton Lefort RDCS (AE) Referring Phys: [place]  1. Septal, apical and inferior apical hypokinesis . Left ventricular ejection fraction, by estimation, is 45 to 50%. The left ventricle has mildly decreased function. The left ventricle has no regional wall motion abnormalities. The left ventricular internal cavity size was mildly dilated. There is mild left ventricular hypertrophy. Left ventricular diastolic parameters are consistent with Grade I diastolic dysfunction (impaired relaxation).  2. Right ventricular systolic function is normal. The right ventricular size is normal.  3. The mitral valve is abnormal. Trivial mitral valve regurgitation. No evidence of mitral stenosis. Moderate mitral annular calcification.  4. The aortic valve is tricuspid. There is mild calcification of the aortic valve. There is mild thickening of the aortic valve. Aortic valve regurgitation is mild. Aortic valve sclerosis is present, with no evidence of aortic valve stenosis.  5. The inferior vena cava is normal in size with greater than 50% respiratory variability, suggesting right atrial pressure of 3 mmHg. FINDINGS  Left Ventricle: Septal, apical and inferior apical hypokinesis. Left ventricular ejection fraction, by estimation, is 45 to 50%. The left ventricle has mildly decreased function. The left ventricle has no regional wall motion abnormalities. Definity contrast agent was given IV to delineate the left ventricular endocardial borders. The left ventricular internal cavity size was mildly dilated. There is mild left ventricular hypertrophy. Left ventricular diastolic parameters are consistent with Grade I  diastolic  dysfunction (impaired relaxation). Right Ventricle: The right ventricular size is normal. No increase in right ventricular  wall thickness. Right ventricular systolic function is normal. Left Atrium: Left atrial size was normal in size. Right Atrium: Right atrial size was normal in size. Pericardium: There is no evidence of pericardial effusion. Mitral Valve: The mitral valve is abnormal. There is mild thickening of the mitral valve leaflet(s). There is mild calcification of the mitral valve leaflet(s). Moderate mitral annular calcification. Trivial mitral valve regurgitation. No evidence of mitral valve stenosis. Tricuspid Valve: The tricuspid valve is normal in structure. Tricuspid valve regurgitation is not demonstrated. No evidence of tricuspid stenosis. Aortic Valve: The aortic valve is tricuspid. There is mild calcification of the aortic valve. There is mild thickening of the aortic valve. Aortic valve regurgitation is mild. Aortic valve sclerosis is present, with no evidence of aortic valve stenosis. Aortic valve mean gradient measures 2.0 mmHg. Aortic valve peak gradient measures 3.2 mmHg. Aortic valve area, by VTI measures 2.02 cm. Pulmonic Valve: The pulmonic valve was normal in structure. Pulmonic valve regurgitation is trivial. No evidence of pulmonic stenosis. Aorta: The aortic root is normal in size and structure. Venous: The inferior vena cava is normal in size with greater than 50% respiratory variability, suggesting right atrial pressure of 3 mmHg. IAS/Shunts: No atrial level shunt detected by color flow Doppler.  LEFT VENTRICLE PLAX 2D LVIDd:         4.00 cm   Diastology LVIDs:         2.90 cm   LV e' lateral: 5.33 cm/s LV PW:         1.30 cm LV IVS:        1.30 cm LVOT diam:     2.00 cm LV SV:         32 LV SV Index:   18 LVOT Area:     3.14 cm  IVC IVC diam: 0.90 cm LEFT ATRIUM             Index LA diam:        2.70 cm 1.53 cm/m LA Vol (A2C):   39.1 ml 22.17 ml/m LA Vol (A4C):   31.6 ml 17.92 ml/m LA Biplane Vol: 38.9 ml 22.06 ml/m  AORTIC VALVE AV Area (Vmax):    2.05 cm AV Area (Vmean):   2.03 cm AV Area  (VTI):     2.02 cm AV Vmax:           89.10 cm/s AV Vmean:          57.000 cm/s AV VTI:            0.157 m AV Peak Grad:      3.2 mmHg AV Mean Grad:      2.0 mmHg LVOT Vmax:         58.20 cm/s LVOT Vmean:        36.900 cm/s LVOT VTI:          0.101 m LVOT/AV VTI ratio: 0.64  AORTA Ao Root diam: 2.90 cm Ao Asc diam:  3.00 cm  SHUNTS Systemic VTI:  0.10 m Systemic Diam: 2.00 cm Jenkins Rouge MD Electronically signed by Jenkins Rouge MD Signature Date/Time: 10/22/2021/10:46:27 AM    Final    DG Chest 2 View  Result Date: 10/21/2021 CLINICAL DATA:  Right chest pain, palpitations EXAM: CHEST - 2 VIEW COMPARISON:  Previous studies including the examination of 05/30/2021 FINDINGS: Cardiac size is within normal limits. There are no signs of  pulmonary edema or focal pulmonary consolidation. Linear densities in right parahilar region appears stable suggesting scarring. Small linear density in left lower lung field near the diaphragm has not changed. Small blebs are seen in both apices with no change. IMPRESSION: There are no new infiltrates or signs of pulmonary edema. Electronically Signed   By: Elmer Picker M.D.   On: 10/21/2021 18:35    Cardiac Studies   Echo as above  Patient Profile     81 y.o. female with a hx of  HTN (chart diagnosis, pt denies), COPD (due to [facility] and Nocardia, never smoker), right breast cancer and left breast DCIS underwent bilateral lumpectomies followed by re-excision and radiation therapy, currently on anastrozole. who is being seen for the evaluation of chest pain, with apparent NSTEMI  Assessment & Plan    Principal Problem:   Chest pain, rule out acute myocardial infarction Active Problems:   Hypothyroidism   Bronchiectasis without acute exacerbation + MAIC   Essential hypertension   Malignant neoplasm of upper-outer quadrant of right breast in female, estrogen receptor positive ([place])   UTI (urinary tract infection)   NSTEMI (non-ST elevated myocardial infarction)  ([place])   NSTEMI/ACS -Cont hep gtt, BB, ASA, atorva 80 for now.  No recurrence of pain, trops downtrending -CT 2020 with LAD calcification; cor angio in AM -Multiple WMA on TTE suggests MVD  2.  Cardiomyopathy -TTE with EF 45-50% with septal, apical, inferior WMA -Cor angio in AM to characterize further  3.  HTN -BP well controlled  4.  HL -Cont high dose statin   I have reviewed the risks, indications, and alternatives to cardiac catheterization, possible angioplasty, and stenting with the patient. Risks include but are not limited to bleeding, infection, vascular injury, stroke, myocardial infection, arrhythmia, kidney injury, radiation-related injury in the case of prolonged fluoroscopy use, emergency cardiac surgery, and death. The patient understands the risks of serious complication is 1-2 in 4718 with diagnostic cardiac cath and 1-2% or less with angioplasty/stenting.         For questions or updates, please contact [facility] Please consult [email or web address] for contact info under        Signed, Early Osmond, MD  10/23/2021, 7:54 AM

## 2021-10-23 NOTE — Progress Notes (Addendum)
ANTICOAGULATION CONSULT NOTE - Initial Consult  Pharmacy Consult for heparin Indication: chest pain/ACS  Allergies  Allergen Reactions   Augmentin [Amoxicillin-Pot Clavulanate] Nausea And Vomiting    .Marland KitchenHas patient had a PCN reaction causing immediate rash, facial/tongue/throat swelling, SOB or lightheadedness with hypotension: No Has patient had a PCN reaction causing severe rash involving mucus membranes or skin necrosis: No Has patient had a PCN reaction that required hospitalization No Has patient had a PCN reaction occurring within the last 10 years: No If all of the above answers are "NO", then may proceed with Cephalosporin use.    Black Cohosh Nausea And Vomiting    Severe GI Upset, sweats   Codeine Nausea And Vomiting    Severe GI upset, sweats   Hyoscyamine Other (See Comments)    Cramps, made urinary symptoms worse   Oxybutynin Chloride Other (See Comments)    Cramps, made urinary symptoms worse   Minocycline Other (See Comments)    Scratchy tongue, swollen lips    Patient Measurements: Height: '5\' 8"'$  (172.7 cm) Weight: 63.3 kg (139 lb 8.8 oz) IBW/kg (Calculated) : 63.9 Heparin DW: 64.1 kg  Vital Signs: Temp: 97.7 F (36.5 C) (09/04 0511) Temp Source: Oral (09/04 0511) BP: 131/74 (09/04 0511) Pulse Rate: 61 (09/04 0511)  Labs: Recent Labs    10/21/21 1747 10/21/21 2054 10/22/21 0557 10/22/21 0609 10/22/21 0908 10/22/21 1533 10/23/21 0217  HGB 14.2  --  14.8  --   --   --  13.9  HCT 43.1  --  45.6  --   --   --  42.6  PLT 184  --  189  --   --   --  180  HEPARINUNFRC  --   --  0.44  --   --  0.32 0.32  CREATININE 0.92  --   --   --  0.77  --  0.99  TROPONINIHS 43* 794*  --  698* 552*  --   --     Estimated Creatinine Clearance: 43.8 mL/min (by C-G formula based on SCr of 0.99 mg/dL).   Medical History: Past Medical History:  Diagnosis Date   Allergy    Anemia    Breast cancer ([place])    Cataract    COPD (chronic obstructive pulmonary disease)  ([place])    Depression    Essential hypertension 11/29/2014   Glaucoma    Hemoptysis    Hypothyroidism    Left bundle branch block 03/16/2005   Oxygen deficiency    patient was using oxygen at home, her pulmonologist discontinued it and patient stopped using it on Monday Mar 20, 2015   Personal history of chemotherapy    Personal history of radiation therapy    Restless leg syndrome    Sinusitis    Vertigo     Medications:  Facility-Administered Medications Prior to Admission  Medication Dose Route Frequency Provider Last Rate Last Admin   [DISCONTINUED] 0.9 %  sodium chloride infusion  500 mL Intravenous Continuous Danis, Estill Cotta III, MD       Medications Prior to Admission  Medication Sig Dispense Refill Last Dose   albuterol (VENTOLIN HFA) 108 (90 Base) MCG/ACT inhaler Inhale 2 puffs into the lungs every 6 (six) hours as needed for wheezing or shortness of breath. 8 g 12 unknown   anastrozole (ARIMIDEX) 1 MG tablet Take 1 tablet (1 mg total) by mouth daily. 90 tablet 3 10/20/2021   azelastine (ASTELIN) 0.1 % nasal spray 1-2 puffs each nostril twice  daily if needed (Patient taking differently: Place 1-2 sprays into both nostrils daily as needed for rhinitis.) 30 mL 12 unknown   benzonatate (TESSALON) 200 MG capsule Take 1 capsule (200 mg total) by mouth 3 (three) times daily as needed for cough. 30 capsule 1 unknown   Cholecalciferol (VITAMIN D) 50 MCG (2000 UT) tablet Take 2,000 Units by mouth daily.   10/20/2021   Coenzyme Q10 (COQ-10) 100 MG CAPS Take 100 mg by mouth daily.   10/20/2021   EUTHYROX 75 MCG tablet Take 75 mcg by mouth every morning.   10/21/2021   ferrous sulfate 325 (65 FE) MG tablet Take 325 mg by mouth daily with breakfast.   10/20/2021   fluticasone (FLONASE) 50 MCG/ACT nasal spray Place 2 sprays into both nostrils daily. (Patient taking differently: Place 2 sprays into both nostrils daily as needed for allergies.) 15 g 2 unknown   Fluticasone-Umeclidin-Vilant (TRELEGY  ELLIPTA) 100-62.5-25 MCG/ACT AEPB Inhale 1 puff into the lungs daily. (Patient taking differently: Inhale 1 puff into the lungs daily as needed (sob/wheezing).) 60 each 6 unknown   folic acid (FOLVITE) 338 MCG tablet Take 400 mcg by mouth daily.   10/20/2021   Krill Oil 350 MG CAPS Take 350 mg by mouth daily.   10/20/2021   latanoprost (XALATAN) 0.005 % ophthalmic solution Place 1 drop into both eyes at bedtime.   10/20/2021   levothyroxine (EUTHYROX) 75 MCG tablet Take 75 mcg by mouth daily before breakfast.   10/21/2021   Multiple Minerals (CALCIUM/MAGNESIUM/ZINC) TABS Take 3 tablets by mouth at bedtime.   10/20/2021   Multiple Vitamin (MULTIVITAMIN) tablet Take 1 tablet by mouth daily.   10/20/2021   Naproxen Sodium 220 MG CAPS Take 440 mg by mouth daily as needed (pain).   unknown   NONFORMULARY OR COMPOUNDED ITEM [facility] - Antifungal topical - Terbinafine 3%, Fluconazole 2%, Tea Tree Oil 5%, Urea 10%, Ibuprofen 2%, + Itraconazole 3%, in #91m DMSO suspension. Apply to affected toenail(s) once at bedtime or twice daily. (Patient taking differently: Apply 1 application  topically daily as needed (Toe fungus). [facility]- Antifungal topical - Terbinafine 3%, Fluconazole 2%, Tea Tree Oil 5%, Urea 10%, Ibuprofen 2%, + Itraconazole 3%, in #376mDMSO suspension. Apply to affected toenail(s) once at bedtime or twice daily.) 30 each 11 unknown   pyridOXINE (VITAMIN B6) 100 MG tablet Take 100 mg by mouth daily.   10/20/2021   venlafaxine XR (EFFEXOR-XR) 37.5 MG 24 hr capsule Take 37.5 mg by mouth daily.   10/20/2021   vitamin C (ASCORBIC ACID) 500 MG tablet Take 500 mg by mouth daily.   10/20/2021   vitamin E 400 UNIT capsule Take 400 Units by mouth daily.   10/20/2021   Scheduled:   anastrozole  1 mg Oral Daily   [START ON 10/24/2021] aspirin  81 mg Oral Pre-Cath   aspirin EC  81 mg Oral Daily   [START ON 10/25/2021] aspirin EC  81 mg Oral Daily   atorvastatin  80 mg Oral Daily   ferrous sulfate  325  mg Oral Q breakfast   fluticasone furoate-vilanterol  1 puff Inhalation Daily   And   umeclidinium bromide  1 puff Inhalation Daily   latanoprost  1 drop Both Eyes QHS   levothyroxine  75 mcg Oral Q0600   metoprolol tartrate  25 mg Oral BID   multivitamin with minerals  1 tablet Oral Daily   sodium chloride flush  3 mL Intravenous Q12H  venlafaxine XR  37.5 mg Oral Daily    Assessment: 81yo female c/o non-radiating right-sided CP/palpitations, pain subsided after NTG x1, troponin elevated--pharmacy consulted to start heparin. LHC planned for Tuesday, 9/5.  Heparin level therapeutic at 0.32 and stable following slight rate increase to 800 units/hr. Hgb 13.9, PLT 180K stable. No issues with heparin infusion or s/sx of bleeding per RN.  Troponin decreased from 794>>552 since 9/2 evening.   Goal of Therapy:  Heparin level 0.3-0.7 units/ml Monitor platelets by anticoagulation protocol: Yes   Plan:  Continue heparin at 800 units/hr Monitor daily CBC, heparin level, signs/symptoms of bleeding  F/u cath plans   Eliseo Gum, PharmD PGY1 Pharmacy Resident   10/23/2021  8:29 AM

## 2021-10-23 NOTE — Progress Notes (Signed)
  Transition of Care [facility]) Screening Note   Patient Details  Name: [name] Date of Birth: 1940-04-13   Transition of Care [facility]) CM/SW Contact:    Bethann Berkshire, Kyle Phone Number: 10/23/2021, 12:50 PM    Transition of Care Department [facility]) has reviewed patient and no TOC needs have been identified at this time. We will continue to monitor patient advancement through interdisciplinary progression rounds. If new patient transition needs arise, please place a TOC consult.

## 2021-10-23 NOTE — Progress Notes (Signed)
Mobility Specialist Progress Note:   10/23/21 1033  Mobility  Activity Ambulated with assistance in hallway  Level of Assistance Contact guard assist, steadying assist  Assistive Device None  Distance Ambulated (ft) 550 ft  Activity Response Tolerated well  $Mobility charge 1 Mobility   Pt received in chair willing to participate in mobility. No complaints of pain. Left in chair with cal bell in reach and all needs met.  [facility] Marchia Diguglielmo Mobility Specialist

## 2021-10-23 NOTE — Progress Notes (Signed)
PROGRESS NOTE    [name]  SWN:[id number] DOB: [birth year] DOA: 10/21/2021 PCP: Jonathon Jordan, MD    Chief Complaint  Patient presents with   Chest Pain    Brief Narrative:  No notes on file    Assessment & Plan:  Principal Problem:   Chest pain, rule out acute myocardial infarction Active Problems:   NSTEMI (non-ST elevated myocardial infarction) ([place])   Bronchiectasis without acute exacerbation + MAIC   Essential hypertension   Malignant neoplasm of upper-outer quadrant of right breast in female, estrogen receptor positive ([place])   Hypothyroidism   UTI (urinary tract infection)    Assessment and Plan: * Chest pain, rule out acute myocardial infarction Non-STEMI.   -On admission initial concern for unstable angina.  DDx includes pulmonary dz, though no worsening SOB, no other pulm symptoms worse at this time; malignancy (recurrent breast CA) as cause of CP. PE unlikely given neg d.dimer. Patient ruled in with elevated cardiac enzymes. -2D echo ordered with septal, apical and inferior apical hypokinesis with left ventricular EF of 45 to 50%, left ventricular internal cavity size mildly dilated, mild LVH, grade 1 diastolic dysfunction. -Continue heparin drip.  -Patient seen in consultation by cardiology and patient scheduled for cardiac catheterization tomorrow, Tuesday, 10/24/2021.  NSTEMI (non-ST elevated myocardial infarction) ([place]) - See chest pain above.  Malignant neoplasm of upper-outer quadrant of right breast in female, estrogen receptor positive ([place])  Continue anastrozole Outpatient follow-up.  Essential hypertension Listed in chart but pt denies history of. Not on any meds prior to admission. -Patient started on Lopressor per cardiology due to non-STEMI. -Blood pressure stable.    Bronchiectasis without acute exacerbation + MAIC Complex pulm history, MAIC followed by [place] -> COPD. But no new cough, SOB.  CXR today doesn't show any new  infiltrates, etc. Cont home inhalers  UTI (urinary tract infection) - Urine cultures pending. -Continue IV Rocephin. -Could likely transition to oral antibiotics postcardiac catheterization.  Hypothyroidism - Continue home regimen Synthroid.         DVT prophylaxis: Heparin Code Status: Full Family Communication: Updated patient.  No family at bedside. Disposition: To be determined  Status is: Inpatient    Consultants:  Cardiology: Dr. Rudi Rummage 10/22/2021  Procedures:  Chest x-ray 10/21/2021 2D echo 10/22/2021  Antimicrobials:  IV Rocephin 10/22/2021>>>>>   Subjective: Sleeping but easily arousable.  Denies any further chest pain.  No shortness of breath.  No abdominal pain.  Tolerating current diet.  Asking when her cardiac catheterization is scheduled for tomorrow.  Objective: Vitals:   10/23/21 0031 10/23/21 0511 10/23/21 0757 10/23/21 0848  BP: (!) 123/56 131/74  120/69  Pulse: 71 61  66  Resp: '16 16  18  '$ Temp: (!) 97.5 F (36.4 C) 97.7 F (36.5 C)  98.7 F (37.1 C)  TempSrc: Oral Oral  Oral  SpO2: 93% 94% 96% 99%  Weight:  63.3 kg    Height:        Intake/Output Summary (Last 24 hours) at 10/23/2021 1620 Last data filed at 10/23/2021 1500 Gross per 24 hour  Intake 801.13 ml  Output 452 ml  Net 349.13 ml   Filed Weights   10/21/21 2213 10/22/21 1700 10/23/21 0511  Weight: 64.1 kg 63.8 kg 63.3 kg    Examination:  General exam: NAD Respiratory system: Lungs clear to auscultation bilaterally.  No wheezes, no crackles, no rhonchi.  Fair air movement.  Speaking in full sentences.   Cardiovascular system: Regular rate rhythm no murmurs rubs  or gallops.  No JVD.  No lower extremity edema.  Gastrointestinal system: Abdomen is soft, nontender, nondistended, positive bowel sounds.  No rebound.  No guarding.  Central nervous system: Alert and oriented. No focal neurological deficits. Extremities: Symmetric 5 x 5 power. Skin: No rashes, lesions or  ulcers Psychiatry: Judgement and insight appear normal. Mood & affect appropriate.     Data Reviewed:   CBC: Recent Labs  Lab 10/21/21 1747 10/22/21 0557 10/23/21 0217  WBC 5.1 5.5 5.2  HGB 14.2 14.8 13.9  HCT 43.1 45.6 42.6  MCV 97.7 97.2 95.9  PLT 184 189 283    Basic Metabolic Panel: Recent Labs  Lab 10/21/21 1747 10/22/21 0908 10/23/21 0217  NA 141 143 137  K 3.3* 3.4* 3.9  CL 107 104 108  CO2 '27 27 23  '$ GLUCOSE 148* 152* 104*  BUN 9 7* 19  CREATININE 0.92 0.77 0.99  CALCIUM 9.3 9.3 9.2  MG  --  2.1 2.2    GFR: Estimated Creatinine Clearance: 43.8 mL/min (by C-G formula based on SCr of 0.99 mg/dL).  Liver Function Tests: No results for input(s): "AST", "ALT", "ALKPHOS", "BILITOT", "PROT", "ALBUMIN" in the last 168 hours.  CBG: No results for input(s): "GLUCAP" in the last 168 hours.   No results found for this or any previous visit (from the past 240 hour(s)).       Radiology Studies: ECHOCARDIOGRAM COMPLETE  Result Date: 10/22/2021    ECHOCARDIOGRAM REPORT   Patient Name:   [name] Date of Exam: 10/22/2021 Medical Rec #:  [id number]            Height:       68.0 in Accession #:    [id number]           Weight:       141.3 lb Date of Birth:  09-27-1940             BSA:          1.763 m Patient Age:    81 years             BP:           140/74 mmHg Patient Gender: F                    HR:           109 bpm. Exam Location:  Inpatient Procedure: 2D Echo, Cardiac Doppler, Color Doppler and Intracardiac            Opacification Agent Indications:    Chest pain  History:        Patient has no prior history of Echocardiogram examinations.                 COPD; Risk Factors:Hypertension.  Sonographer:    Clayton Lefort RDCS (AE) Referring Phys: [place]  1. Septal, apical and inferior apical hypokinesis . Left ventricular ejection fraction, by estimation, is 45 to 50%. The left ventricle has mildly decreased function. The left ventricle  has no regional wall motion abnormalities. The left ventricular internal cavity size was mildly dilated. There is mild left ventricular hypertrophy. Left ventricular diastolic parameters are consistent with Grade I diastolic dysfunction (impaired relaxation).  2. Right ventricular systolic function is normal. The right ventricular size is normal.  3. The mitral valve is abnormal. Trivial mitral valve regurgitation. No evidence of mitral stenosis. Moderate mitral annular calcification.  4. The aortic valve is tricuspid.  There is mild calcification of the aortic valve. There is mild thickening of the aortic valve. Aortic valve regurgitation is mild. Aortic valve sclerosis is present, with no evidence of aortic valve stenosis.  5. The inferior vena cava is normal in size with greater than 50% respiratory variability, suggesting right atrial pressure of 3 mmHg. FINDINGS  Left Ventricle: Septal, apical and inferior apical hypokinesis. Left ventricular ejection fraction, by estimation, is 45 to 50%. The left ventricle has mildly decreased function. The left ventricle has no regional wall motion abnormalities. Definity contrast agent was given IV to delineate the left ventricular endocardial borders. The left ventricular internal cavity size was mildly dilated. There is mild left ventricular hypertrophy. Left ventricular diastolic parameters are consistent with Grade I  diastolic dysfunction (impaired relaxation). Right Ventricle: The right ventricular size is normal. No increase in right ventricular wall thickness. Right ventricular systolic function is normal. Left Atrium: Left atrial size was normal in size. Right Atrium: Right atrial size was normal in size. Pericardium: There is no evidence of pericardial effusion. Mitral Valve: The mitral valve is abnormal. There is mild thickening of the mitral valve leaflet(s). There is mild calcification of the mitral valve leaflet(s). Moderate mitral annular calcification.  Trivial mitral valve regurgitation. No evidence of mitral valve stenosis. Tricuspid Valve: The tricuspid valve is normal in structure. Tricuspid valve regurgitation is not demonstrated. No evidence of tricuspid stenosis. Aortic Valve: The aortic valve is tricuspid. There is mild calcification of the aortic valve. There is mild thickening of the aortic valve. Aortic valve regurgitation is mild. Aortic valve sclerosis is present, with no evidence of aortic valve stenosis. Aortic valve mean gradient measures 2.0 mmHg. Aortic valve peak gradient measures 3.2 mmHg. Aortic valve area, by VTI measures 2.02 cm. Pulmonic Valve: The pulmonic valve was normal in structure. Pulmonic valve regurgitation is trivial. No evidence of pulmonic stenosis. Aorta: The aortic root is normal in size and structure. Venous: The inferior vena cava is normal in size with greater than 50% respiratory variability, suggesting right atrial pressure of 3 mmHg. IAS/Shunts: No atrial level shunt detected by color flow Doppler.  LEFT VENTRICLE PLAX 2D LVIDd:         4.00 cm   Diastology LVIDs:         2.90 cm   LV e' lateral: 5.33 cm/s LV PW:         1.30 cm LV IVS:        1.30 cm LVOT diam:     2.00 cm LV SV:         32 LV SV Index:   18 LVOT Area:     3.14 cm  IVC IVC diam: 0.90 cm LEFT ATRIUM             Index LA diam:        2.70 cm 1.53 cm/m LA Vol (A2C):   39.1 ml 22.17 ml/m LA Vol (A4C):   31.6 ml 17.92 ml/m LA Biplane Vol: 38.9 ml 22.06 ml/m  AORTIC VALVE AV Area (Vmax):    2.05 cm AV Area (Vmean):   2.03 cm AV Area (VTI):     2.02 cm AV Vmax:           89.10 cm/s AV Vmean:          57.000 cm/s AV VTI:            0.157 m AV Peak Grad:      3.2 mmHg AV Mean Grad:  2.0 mmHg LVOT Vmax:         58.20 cm/s LVOT Vmean:        36.900 cm/s LVOT VTI:          0.101 m LVOT/AV VTI ratio: 0.64  AORTA Ao Root diam: 2.90 cm Ao Asc diam:  3.00 cm  SHUNTS Systemic VTI:  0.10 m Systemic Diam: 2.00 cm Jenkins Rouge MD Electronically signed by Jenkins Rouge MD Signature Date/Time: 10/22/2021/10:46:27 AM    Final    DG Chest 2 View  Result Date: 10/21/2021 CLINICAL DATA:  Right chest pain, palpitations EXAM: CHEST - 2 VIEW COMPARISON:  Previous studies including the examination of 05/30/2021 FINDINGS: Cardiac size is within normal limits. There are no signs of pulmonary edema or focal pulmonary consolidation. Linear densities in right parahilar region appears stable suggesting scarring. Small linear density in left lower lung field near the diaphragm has not changed. Small blebs are seen in both apices with no change. IMPRESSION: There are no new infiltrates or signs of pulmonary edema. Electronically Signed   By: Elmer Picker M.D.   On: 10/21/2021 18:35        Scheduled Meds:  anastrozole  1 mg Oral Daily   [START ON 10/24/2021] aspirin  81 mg Oral Pre-Cath   [START ON 10/25/2021] aspirin EC  81 mg Oral Daily   atorvastatin  80 mg Oral Daily   ferrous sulfate  325 mg Oral Q breakfast   fluticasone furoate-vilanterol  1 puff Inhalation Daily   And   umeclidinium bromide  1 puff Inhalation Daily   latanoprost  1 drop Both Eyes QHS   levothyroxine  75 mcg Oral Q0600   metoprolol tartrate  25 mg Oral BID   multivitamin with minerals  1 tablet Oral Daily   sodium chloride flush  3 mL Intravenous Q12H   venlafaxine XR  37.5 mg Oral Daily   Continuous Infusions:  sodium chloride     [START ON 10/24/2021] sodium chloride     Followed by   Derrill Memo ON 10/24/2021] sodium chloride     cefTRIAXone (ROCEPHIN)  IV 1 g (10/23/21 0922)   heparin 800 Units/hr (10/23/21 0041)     LOS: 1 day    Time spent: 35 minutes    Irine Seal, MD [facility]   To contact the attending provider between 7A-7P or the covering provider during after hours 7P-7A, please log into the web site [email or web address] and access using universal [place] password for that web site. If you do not have the password, please call the hospital  operator.  10/23/2021, 4:20 PM

## 2021-10-24 ENCOUNTER — Inpatient Hospital Stay (HOSPITAL_COMMUNITY)
Admission: EM | Disposition: A | Discharge status: Home / Self Care | Payer: Self-pay | Source: Home / Self Care | Attending: Internal Medicine

## 2021-10-24 DIAGNOSIS — I214 Non-ST elevation (NSTEMI) myocardial infarction: Secondary | ICD-10-CM | POA: Diagnosis not present

## 2021-10-24 DIAGNOSIS — I10 Essential (primary) hypertension: Secondary | ICD-10-CM | POA: Diagnosis not present

## 2021-10-24 DIAGNOSIS — R079 Chest pain, unspecified: Secondary | ICD-10-CM | POA: Diagnosis not present

## 2021-10-24 DIAGNOSIS — I2510 Atherosclerotic heart disease of native coronary artery without angina pectoris: Secondary | ICD-10-CM

## 2021-10-24 DIAGNOSIS — J479 Bronchiectasis, uncomplicated: Secondary | ICD-10-CM | POA: Diagnosis not present

## 2021-10-24 DIAGNOSIS — E039 Hypothyroidism, unspecified: Secondary | ICD-10-CM | POA: Diagnosis not present

## 2021-10-24 HISTORY — PX: CORONARY STENT INTERVENTION: CATH118234

## 2021-10-24 HISTORY — PX: LEFT HEART CATH AND CORONARY ANGIOGRAPHY: CATH118249

## 2021-10-24 LAB — HEPARIN LEVEL (UNFRACTIONATED): Heparin Unfractionated: 0.31 IU/mL (ref 0.30–0.70)

## 2021-10-24 LAB — CBC
HCT: 40.5 % (ref 36.0–46.0)
Hemoglobin: 13.3 g/dL (ref 12.0–15.0)
MCH: 31.5 pg (ref 26.0–34.0)
MCHC: 32.8 g/dL (ref 30.0–36.0)
MCV: 96 fL (ref 80.0–100.0)
Platelets: 165 10*3/uL (ref 150–400)
RBC: 4.22 MIL/uL (ref 3.87–5.11)
RDW: 13.2 % (ref 11.5–15.5)
WBC: 4.7 10*3/uL (ref 4.0–10.5)
nRBC: 0 % (ref 0.0–0.2)

## 2021-10-24 LAB — POCT ACTIVATED CLOTTING TIME
Activated Clotting Time: 227 seconds
Activated Clotting Time: 528 seconds
Activated Clotting Time: 173 seconds

## 2021-10-24 LAB — BASIC METABOLIC PANEL
Anion gap: 9 (ref 5–15)
BUN: 18 mg/dL (ref 8–23)
CO2: 23 mmol/L (ref 22–32)
Calcium: 9.4 mg/dL (ref 8.9–10.3)
Chloride: 108 mmol/L (ref 98–111)
Creatinine, Ser: 0.76 mg/dL (ref 0.44–1.00)
GFR, Estimated: >60 mL/min (ref >60)
Glucose, Bld: 99 mg/dL (ref 70–99)
Potassium: 4.4 mmol/L (ref 3.5–5.1)
Sodium: 140 mmol/L (ref 135–145)

## 2021-10-24 LAB — MAGNESIUM: Magnesium: 2.2 mg/dL (ref 1.7–2.4)

## 2021-10-24 LAB — MRSA NEXT GEN BY PCR, NASAL: MRSA by PCR Next Gen: NOT DETECTED

## 2021-10-24 SURGERY — LEFT HEART CATH AND CORONARY ANGIOGRAPHY
Anesthesia: LOCAL

## 2021-10-24 MED ORDER — NITROGLYCERIN 1 MG/10 ML FOR IR/CATH LAB
INTRA_ARTERIAL | Status: AC
Start: 2021-10-24 — End: ?
  Filled 2021-10-24: qty 10

## 2021-10-24 MED ORDER — CLOPIDOGREL BISULFATE 75 MG PO TABS
75.0000 mg | ORAL_TABLET | Freq: Every day | ORAL | Status: DC
  Administered 2021-10-25 – 2021-10-26 (×2): 75 mg via ORAL
  Filled 2021-10-24 (×3): qty 1

## 2021-10-24 MED ORDER — HYDRALAZINE HCL 20 MG/ML IJ SOLN: 10.0000 mg | INTRAMUSCULAR | Status: AC | PRN

## 2021-10-24 MED ORDER — IOHEXOL 350 MG/ML SOLN
INTRAVENOUS | Status: DC | PRN
  Administered 2021-10-24: 120 mL

## 2021-10-24 MED ORDER — CLOPIDOGREL BISULFATE 75 MG PO TABS
ORAL_TABLET | ORAL | Status: AC
Start: 2021-10-24 — End: ?
  Filled 2021-10-24: qty 8

## 2021-10-24 MED ORDER — LIDOCAINE HCL (PF) 1 % IJ SOLN
INTRAMUSCULAR | Status: AC
  Filled 2021-10-24: qty 30

## 2021-10-24 MED ORDER — VERAPAMIL HCL 2.5 MG/ML IV SOLN
INTRAVENOUS | Status: DC | PRN
  Administered 2021-10-24: 10 mL via INTRA_ARTERIAL

## 2021-10-24 MED ORDER — MIDAZOLAM HCL 2 MG/2ML IJ SOLN
INTRAMUSCULAR | Status: AC
  Filled 2021-10-24: qty 2

## 2021-10-24 MED ORDER — CLOPIDOGREL BISULFATE 75 MG PO TABS
ORAL_TABLET | ORAL | Status: AC
  Filled 2021-10-24: qty 1

## 2021-10-24 MED ORDER — NITROGLYCERIN 1 MG/10 ML FOR IR/CATH LAB
INTRA_ARTERIAL | Status: DC | PRN
  Administered 2021-10-24 (×4): 200 ug via INTRACORONARY

## 2021-10-24 MED ORDER — CLOPIDOGREL BISULFATE 300 MG PO TABS
ORAL_TABLET | ORAL | Status: DC | PRN
  Administered 2021-10-24: 600 mg via ORAL

## 2021-10-24 MED ORDER — FAMOTIDINE IN NACL 20-0.9 MG/50ML-% IV SOLN
INTRAVENOUS | Status: AC
  Filled 2021-10-24: qty 50

## 2021-10-24 MED ORDER — MIDAZOLAM HCL 2 MG/2ML IJ SOLN
INTRAMUSCULAR | Status: DC | PRN
  Administered 2021-10-24 (×2): 1 mg via INTRAVENOUS

## 2021-10-24 MED ORDER — SODIUM CHLORIDE 0.9% FLUSH
3.0000 mL | Freq: Two times a day (BID) | INTRAVENOUS | Status: DC
  Administered 2021-10-25 – 2021-10-26 (×3): 3 mL via INTRAVENOUS

## 2021-10-24 MED ORDER — HEPARIN SODIUM (PORCINE) 1000 UNIT/ML IJ SOLN
INTRAMUSCULAR | Status: AC
  Filled 2021-10-24: qty 10

## 2021-10-24 MED ORDER — FAMOTIDINE IN NACL 20-0.9 MG/50ML-% IV SOLN
INTRAVENOUS | Status: AC | PRN
  Administered 2021-10-24: 20 mg via INTRAVENOUS

## 2021-10-24 MED ORDER — ACETAMINOPHEN 325 MG PO TABS
ORAL_TABLET | ORAL | Status: AC
  Filled 2021-10-24: qty 2

## 2021-10-24 MED ORDER — VERAPAMIL HCL 2.5 MG/ML IV SOLN
INTRAVENOUS | Status: AC
  Filled 2021-10-24: qty 2

## 2021-10-24 MED ORDER — FENTANYL CITRATE (PF) 100 MCG/2ML IJ SOLN
INTRAMUSCULAR | Status: DC | PRN
  Administered 2021-10-24 (×2): 25 ug via INTRAVENOUS

## 2021-10-24 MED ORDER — HEPARIN SODIUM (PORCINE) 5000 UNIT/ML IJ SOLN
5000.0000 [IU] | Freq: Three times a day (TID) | INTRAMUSCULAR | Status: DC
  Administered 2021-10-25 – 2021-10-26 (×4): 5000 [IU] via SUBCUTANEOUS
  Filled 2021-10-24 (×4): qty 1

## 2021-10-24 MED ORDER — LIDOCAINE HCL (PF) 1 % IJ SOLN
INTRAMUSCULAR | Status: DC | PRN
  Administered 2021-10-24: 15 mL
  Administered 2021-10-24: 2 mL

## 2021-10-24 MED ORDER — HEPARIN (PORCINE) IN NACL 1000-0.9 UT/500ML-% IV SOLN
INTRAVENOUS | Status: AC
  Filled 2021-10-24: qty 1000

## 2021-10-24 MED ORDER — SODIUM CHLORIDE 0.9% FLUSH: 3.0000 mL | INTRAVENOUS | Status: DC | PRN

## 2021-10-24 MED ORDER — HEPARIN SODIUM (PORCINE) 1000 UNIT/ML IJ SOLN
INTRAMUSCULAR | Status: DC | PRN
  Administered 2021-10-24: 6000 [IU] via INTRAVENOUS

## 2021-10-24 MED ORDER — SODIUM CHLORIDE 0.9 % WEIGHT BASED INFUSION: 1.0000 mL/kg/h | INTRAVENOUS | Status: AC

## 2021-10-24 MED ORDER — ACETAMINOPHEN 325 MG PO TABS
650.0000 mg | ORAL_TABLET | Freq: Four times a day (QID) | ORAL | Status: DC | PRN
  Administered 2021-10-24: 650 mg via ORAL

## 2021-10-24 MED ORDER — FENTANYL CITRATE (PF) 100 MCG/2ML IJ SOLN
INTRAMUSCULAR | Status: AC
  Filled 2021-10-24: qty 2

## 2021-10-24 MED ORDER — SODIUM CHLORIDE 0.9 % IV SOLN: 250.0000 mL | INTRAVENOUS | Status: DC | PRN

## 2021-10-24 MED ORDER — CLOPIDOGREL BISULFATE 75 MG PO TABS
75.0000 mg | ORAL_TABLET | Freq: Once | ORAL | Status: AC
  Administered 2021-10-24: 75 mg via ORAL

## 2021-10-24 SURGICAL SUPPLY — 29 items
BALL SAPPHIRE NC24 2.75X15 (BALLOONS) ×1
BALLN SAPPHIRE 2.0X12 (BALLOONS) ×1
BALLN SCOREFLEX 2.25X15 (BALLOONS) ×1
BALLOON SAPPHIRE 2.0X12 (BALLOONS) IMPLANT
BALLOON SAPPHIRE NC24 2.75X15 (BALLOONS) IMPLANT
BALLOON SCOREFLEX 2.25X15 (BALLOONS) IMPLANT
BAND CMPR LRG ZPHR (HEMOSTASIS) ×1
BAND ZEPHYR COMPRESS 30 LONG (HEMOSTASIS) IMPLANT
CATH 5FR JL3.5 JR4 ANG PIG MP (CATHETERS) IMPLANT
CATH INFINITI 5FR JL4 (CATHETERS) IMPLANT
CATH VISTA GUIDE 6FR XBLAD3.5 (CATHETERS) IMPLANT
GLIDESHEATH SLEND SS 6F .021 (SHEATH) IMPLANT
GUIDEWIRE INQWIRE 1.5J.035X260 (WIRE) IMPLANT
INQWIRE 1.5J .035X260CM (WIRE) ×1
KIT ENCORE 26 ADVANTAGE (KITS) IMPLANT
KIT HEART LEFT (KITS) ×2 IMPLANT
PACK CARDIAC CATHETERIZATION (CUSTOM PROCEDURE TRAY) ×2 IMPLANT
SHEATH PINNACLE 5F 10CM (SHEATH) IMPLANT
SHEATH PINNACLE 6F 10CM (SHEATH) IMPLANT
SHEATH PROBE COVER 6X72 (BAG) IMPLANT
STENT SYNERGY XD 2.50X28 (Permanent Stent) IMPLANT
SYNERGY XD 2.50X28 (Permanent Stent) ×1 IMPLANT
SYR MEDRAD MARK 7 150ML (SYRINGE) ×2 IMPLANT
TRANSDUCER W/STOPCOCK (MISCELLANEOUS) ×2 IMPLANT
TUBING CIL FLEX 10 FLL-RA (TUBING) ×2 IMPLANT
WIRE ASAHI PROWATER 180CM (WIRE) IMPLANT
WIRE EMERALD 3MM-J .035X150CM (WIRE) IMPLANT
WIRE HI TORQ VERSACORE-J 145CM (WIRE) IMPLANT
WIRE RUNTHROUGH IZANAI 014 180 (WIRE) IMPLANT

## 2021-10-24 NOTE — Progress Notes (Signed)
[place] for heparin Indication: chest pain/ACS  Allergies  Allergen Reactions   Augmentin [Amoxicillin-Pot Clavulanate] Nausea And Vomiting    .Marland KitchenHas patient had a PCN reaction causing immediate rash, facial/tongue/throat swelling, SOB or lightheadedness with hypotension: No Has patient had a PCN reaction causing severe rash involving mucus membranes or skin necrosis: No Has patient had a PCN reaction that required hospitalization No Has patient had a PCN reaction occurring within the last 10 years: No If all of the above answers are "NO", then may proceed with Cephalosporin use.    Black Cohosh Nausea And Vomiting    Severe GI Upset, sweats   Codeine Nausea And Vomiting    Severe GI upset, sweats   Hyoscyamine Other (See Comments)    Cramps, made urinary symptoms worse   Oxybutynin Chloride Other (See Comments)    Cramps, made urinary symptoms worse   Minocycline Other (See Comments)    Scratchy tongue, swollen lips    Patient Measurements: Height: '5\' 8"'$  (172.7 cm) Weight: 63.3 kg (139 lb 8.8 oz) IBW/kg (Calculated) : 63.9 Heparin DW: 64.1 kg  Vital Signs: Temp: 97.6 F (36.4 C) (09/05 0825) Temp Source: Oral (09/05 0825) BP: 128/53 (09/05 0404) Pulse Rate: 64 (09/05 0404)  Labs: Recent Labs    10/21/21 1747 10/21/21 2054 10/22/21 0557 10/22/21 0609 10/22/21 0908 10/22/21 1533 10/23/21 0217 10/24/21 0248  HGB  --   --  14.8  --   --   --  13.9 13.3  HCT  --   --  45.6  --   --   --  42.6 40.5  PLT  --   --  189  --   --   --  180 165  HEPARINUNFRC   < >  --  0.44  --   --  0.32 0.32 0.31  CREATININE  --   --   --   --  0.77  --  0.99 0.76  TROPONINIHS  --  794*  --  698* 552*  --   --   --    < > = values in this interval not displayed.     Estimated Creatinine Clearance: 54.2 mL/min (by C-G formula based on SCr of 0.76 mg/dL).   Medical History: Past Medical History:  Diagnosis Date   Allergy    Anemia     Breast cancer ([place])    Cataract    COPD (chronic obstructive pulmonary disease) ([place])    Depression    Essential hypertension 11/29/2014   Glaucoma    Hemoptysis    Hypothyroidism    Left bundle branch block 03/16/2005   Oxygen deficiency    patient was using oxygen at home, her pulmonologist discontinued it and patient stopped using it on Monday Mar 20, 2015   Personal history of chemotherapy    Personal history of radiation therapy    Restless leg syndrome    Sinusitis    Vertigo     Medications:  Facility-Administered Medications Prior to Admission  Medication Dose Route Frequency Provider Last Rate Last Admin   [DISCONTINUED] 0.9 %  sodium chloride infusion  500 mL Intravenous Continuous Danis, Estill Cotta III, MD       Medications Prior to Admission  Medication Sig Dispense Refill Last Dose   albuterol (VENTOLIN HFA) 108 (90 Base) MCG/ACT inhaler Inhale 2 puffs into the lungs every 6 (six) hours as needed for wheezing or shortness of breath. 8 g 12 unknown  anastrozole (ARIMIDEX) 1 MG tablet Take 1 tablet (1 mg total) by mouth daily. 90 tablet 3 10/20/2021   azelastine (ASTELIN) 0.1 % nasal spray 1-2 puffs each nostril twice daily if needed (Patient taking differently: Place 1-2 sprays into both nostrils daily as needed for rhinitis.) 30 mL 12 unknown   benzonatate (TESSALON) 200 MG capsule Take 1 capsule (200 mg total) by mouth 3 (three) times daily as needed for cough. 30 capsule 1 unknown   Cholecalciferol (VITAMIN D) 50 MCG (2000 UT) tablet Take 2,000 Units by mouth daily.   10/20/2021   Coenzyme Q10 (COQ-10) 100 MG CAPS Take 100 mg by mouth daily.   10/20/2021   EUTHYROX 75 MCG tablet Take 75 mcg by mouth every morning.   10/21/2021   ferrous sulfate 325 (65 FE) MG tablet Take 325 mg by mouth daily with breakfast.   10/20/2021   fluticasone (FLONASE) 50 MCG/ACT nasal spray Place 2 sprays into both nostrils daily. (Patient taking differently: Place 2 sprays into both nostrils daily as  needed for allergies.) 15 g 2 unknown   Fluticasone-Umeclidin-Vilant (TRELEGY ELLIPTA) 100-62.5-25 MCG/ACT AEPB Inhale 1 puff into the lungs daily. (Patient taking differently: Inhale 1 puff into the lungs daily as needed (sob/wheezing).) 60 each 6 unknown   folic acid (FOLVITE) 353 MCG tablet Take 400 mcg by mouth daily.   10/20/2021   Krill Oil 350 MG CAPS Take 350 mg by mouth daily.   10/20/2021   latanoprost (XALATAN) 0.005 % ophthalmic solution Place 1 drop into both eyes at bedtime.   10/20/2021   levothyroxine (EUTHYROX) 75 MCG tablet Take 75 mcg by mouth daily before breakfast.   10/21/2021   Multiple Minerals (CALCIUM/MAGNESIUM/ZINC) TABS Take 3 tablets by mouth at bedtime.   10/20/2021   Multiple Vitamin (MULTIVITAMIN) tablet Take 1 tablet by mouth daily.   10/20/2021   Naproxen Sodium 220 MG CAPS Take 440 mg by mouth daily as needed (pain).   unknown   NONFORMULARY OR COMPOUNDED ITEM [facility] - Antifungal topical - Terbinafine 3%, Fluconazole 2%, Tea Tree Oil 5%, Urea 10%, Ibuprofen 2%, + Itraconazole 3%, in #23m DMSO suspension. Apply to affected toenail(s) once at bedtime or twice daily. (Patient taking differently: Apply 1 application  topically daily as needed (Toe fungus). [facility]- Antifungal topical - Terbinafine 3%, Fluconazole 2%, Tea Tree Oil 5%, Urea 10%, Ibuprofen 2%, + Itraconazole 3%, in #365mDMSO suspension. Apply to affected toenail(s) once at bedtime or twice daily.) 30 each 11 unknown   pyridOXINE (VITAMIN B6) 100 MG tablet Take 100 mg by mouth daily.   10/20/2021   venlafaxine XR (EFFEXOR-XR) 37.5 MG 24 hr capsule Take 37.5 mg by mouth daily.   10/20/2021   vitamin C (ASCORBIC ACID) 500 MG tablet Take 500 mg by mouth daily.   10/20/2021   vitamin E 400 UNIT capsule Take 400 Units by mouth daily.   10/20/2021   Scheduled:   anastrozole  1 mg Oral Daily   [START ON 10/25/2021] aspirin EC  81 mg Oral Daily   atorvastatin  80 mg Oral Daily   ferrous sulfate  325 mg  Oral Q breakfast   fluticasone furoate-vilanterol  1 puff Inhalation Daily   And   umeclidinium bromide  1 puff Inhalation Daily   latanoprost  1 drop Both Eyes QHS   levothyroxine  75 mcg Oral Q0600   metoprolol tartrate  25 mg Oral BID   multivitamin with minerals  1 tablet Oral Daily  sodium chloride flush  3 mL Intravenous Q12H   venlafaxine XR  37.5 mg Oral Daily    Assessment: 81yo female c/o non-radiating right-sided CP/palpitations, pain subsided after NTG x1, troponin elevated--pharmacy consulted to start heparin. LHC planned for Tuesday, 9/5.  Heparin level therapeutic at 0.31 on 800 units/hr, CBC stable  Goal of Therapy:  Heparin level 0.3-0.7 units/ml Monitor platelets by anticoagulation protocol: Yes   Plan:  Continue heparin at 800 units/hr Will follow plans post cath  Hildred Laser, PharmD Clinical Pharmacist **Pharmacist phone directory can now be found on [email or web address] ([email or web address]).  Listed under [place].

## 2021-10-24 NOTE — H&P (View-Only) (Signed)
Rounding Note    Patient Name: [name] Date of Encounter: 10/24/2021  [facility] Cardiologist: Elouise Munroe, MD   Subjective   Patient reports feeling well and has no complaints today. Denies chest pain or dyspnea.   Inpatient Medications    Scheduled Meds:  anastrozole  1 mg Oral Daily   [START ON 10/25/2021] aspirin EC  81 mg Oral Daily   atorvastatin  80 mg Oral Daily   ferrous sulfate  325 mg Oral Q breakfast   fluticasone furoate-vilanterol  1 puff Inhalation Daily   And   umeclidinium bromide  1 puff Inhalation Daily   latanoprost  1 drop Both Eyes QHS   levothyroxine  75 mcg Oral Q0600   metoprolol tartrate  25 mg Oral BID   multivitamin with minerals  1 tablet Oral Daily   sodium chloride flush  3 mL Intravenous Q12H   venlafaxine XR  37.5 mg Oral Daily   Continuous Infusions:  sodium chloride     sodium chloride 1 mL/kg/hr (10/24/21 0505)   cefTRIAXone (ROCEPHIN)  IV 1 g (10/24/21 0827)   heparin 800 Units/hr (10/23/21 0041)   PRN Meds: sodium chloride, acetaminophen, albuterol, ondansetron (ZOFRAN) IV, sodium chloride flush   Vital Signs    Vitals:   10/23/21 2324 10/24/21 0404 10/24/21 0825 10/24/21 0919  BP: 126/66 (!) 128/53    Pulse: 67 64    Resp: 20 19    Temp: (!) 97.5 F (36.4 C) 97.8 F (36.6 C) 97.6 F (36.4 C)   TempSrc: Oral Oral Oral   SpO2: 95% 97% 94% 95%  Weight:      Height:        Intake/Output Summary (Last 24 hours) at 10/24/2021 1059 Last data filed at 10/24/2021 0606 Gross per 24 hour  Intake 1277.42 ml  Output 1002 ml  Net 275.42 ml      10/23/2021    5:11 AM 10/22/2021    5:00 PM 10/21/2021   10:13 PM  Last 3 Weights  Weight (lbs) 139 lb 8.8 oz 140 lb 9.6 oz 141 lb 5 oz  Weight (kg) 63.3 kg 63.776 kg 64.1 kg      Telemetry    Sinus rhythm - Personally Reviewed  ECG    9/2 EKG showed ST vs ectopic atrial tachycardia, LBBB - Personally Reviewed  Physical Exam   GEN: No acute distress.    Cardiac: RRR, no murmurs, rubs, or gallops. 2+ radial pulses bilaterally. Respiratory: Normal work of breathing. Clear to auscultation bilaterally. MS: No edema; No deformity. Neuro:  alert and oriented   Psych: Normal affect   Labs    High Sensitivity Troponin:   Recent Labs  Lab 10/21/21 1747 10/21/21 2054 10/22/21 0609 10/22/21 0908  TROPONINIHS 43* 794* 698* 552*     Chemistry Recent Labs  Lab 10/22/21 0908 10/23/21 0217 10/24/21 0248  NA 143 137 140  K 3.4* 3.9 4.4  CL 104 108 108  CO2 '27 23 23  '$ GLUCOSE 152* 104* 99  BUN 7* 19 18  CREATININE 0.77 0.99 0.76  CALCIUM 9.3 9.2 9.4  MG 2.1 2.2 2.2  GFRNONAA >60 57* >60  ANIONGAP '12 6 9    '$ Lipids  Recent Labs  Lab 10/22/21 0609  CHOL 196  TRIG 299*  HDL 55  LDLCALC 81  CHOLHDL 3.6    Hematology Recent Labs  Lab 10/22/21 0557 10/23/21 0217 10/24/21 0248  WBC 5.5 5.2 4.7  RBC 4.69 4.44 4.22  HGB 14.8 13.9 13.3  HCT 45.6 42.6 40.5  MCV 97.2 95.9 96.0  MCH 31.6 31.3 31.5  MCHC 32.5 32.6 32.8  RDW 13.2 13.0 13.2  PLT 189 180 165   Thyroid  Recent Labs  Lab 10/22/21 0609  TSH 8.344*    BNPNo results for input(s): "BNP", "PROBNP" in the last 168 hours.  DDimer  Recent Labs  Lab 10/21/21 1807  DDIMER 0.45     Radiology    No results found.  Cardiac Studies   10/22/2021 Echo  1. Septal, apical and inferior apical hypokinesis . Left ventricular  ejection fraction, by estimation, is 45 to 50%. The left ventricle has  mildly decreased function. The left ventricle has no regional wall motion  abnormalities. The left ventricular  internal cavity size was mildly dilated. There is mild left ventricular  hypertrophy. Left ventricular diastolic parameters are consistent with  Grade I diastolic dysfunction (impaired relaxation).   2. Right ventricular systolic function is normal. The right ventricular  size is normal.   3. The mitral valve is abnormal. Trivial mitral valve regurgitation. No   evidence of mitral stenosis. Moderate mitral annular calcification.   4. The aortic valve is tricuspid. There is mild calcification of the  aortic valve. There is mild thickening of the aortic valve. Aortic valve  regurgitation is mild. Aortic valve sclerosis is present, with no evidence  of aortic valve stenosis.   5. The inferior vena cava is normal in size with greater than 50%  respiratory variability, suggesting right atrial pressure of 3 mmHg.   Patient Profile     81 y.o. female with a hx of  HTN (chart diagnosis, pt denies), COPD (due to [facility] and Nocardia, never smoker), right breast cancer and left breast DCIS underwent bilateral lumpectomies followed by re-excision and radiation therapy, currently on anastrozole. who is being seen for the evaluation of chest pain, with apparent NSTEMI  Assessment & Plan    Principal Problem:   Chest pain, rule out acute myocardial infarction Active Problems:   Hypothyroidism   Bronchiectasis without acute exacerbation + MAIC   Essential hypertension   Malignant neoplasm of upper-outer quadrant of right breast in female, estrogen receptor positive ([place])   UTI (urinary tract infection)   NSTEMI (non-ST elevated myocardial infarction) [facility])   NSTEMI/ACS Patient denies chest pain or dyspnea. Trops up to 794 but downtrending now.  -on hep gtt, lopressor 25 mg BID, ASA 81 mg, atorvastatin 80 mg daily -CT 2020 with LAD calcification -Multiple WMA on TTE suggests MVD -Cor angio plan for today  2.  Cardiomyopathy -TTE with EF 45-50% with septal, apical, inferior WMA -Cor angio plan for today to characterize further  3.  HTN -BP well controlled  4.  HL -continue high dose statin    For questions or updates, please contact [place] Please consult [email or web address] for contact info under        Signed, Angelique Blonder, DO  10/24/2021, 10:59 AM    I have examined the patient and reviewed assessment and plan and discussed with  patient.  Agree with above as stated.    Cath today.  All questions answered. 2+ right radial pulse.   Larae Grooms

## 2021-10-24 NOTE — Progress Notes (Signed)
Mobility Specialist Progress Note:   10/24/21 1252  Mobility  Activity Ambulated with assistance to bathroom  Level of Assistance Independent  Assistive Device None  Distance Ambulated (ft) 30 ft  Activity Response Tolerated well  $Mobility charge 1 Mobility   Pt received in bed needing to use the bathroom. No complaints of pain. Left in bed with call bell in reach and all needs met.   [facility] Josiephine Simao Mobility Specialist

## 2021-10-24 NOTE — Progress Notes (Addendum)
Rounding Note    Patient Name: [name] Date of Encounter: 10/24/2021  [place] Cardiologist: Elouise Munroe, MD   Subjective   Patient reports feeling well and has no complaints today. Denies chest pain or dyspnea.   Inpatient Medications    Scheduled Meds:  anastrozole  1 mg Oral Daily   [START ON 10/25/2021] aspirin EC  81 mg Oral Daily   atorvastatin  80 mg Oral Daily   ferrous sulfate  325 mg Oral Q breakfast   fluticasone furoate-vilanterol  1 puff Inhalation Daily   And   umeclidinium bromide  1 puff Inhalation Daily   latanoprost  1 drop Both Eyes QHS   levothyroxine  75 mcg Oral Q0600   metoprolol tartrate  25 mg Oral BID   multivitamin with minerals  1 tablet Oral Daily   sodium chloride flush  3 mL Intravenous Q12H   venlafaxine XR  37.5 mg Oral Daily   Continuous Infusions:  sodium chloride     sodium chloride 1 mL/kg/hr (10/24/21 0505)   cefTRIAXone (ROCEPHIN)  IV 1 g (10/24/21 0827)   heparin 800 Units/hr (10/23/21 0041)   PRN Meds: sodium chloride, acetaminophen, albuterol, ondansetron (ZOFRAN) IV, sodium chloride flush   Vital Signs    Vitals:   10/23/21 2324 10/24/21 0404 10/24/21 0825 10/24/21 0919  BP: 126/66 (!) 128/53    Pulse: 67 64    Resp: 20 19    Temp: (!) 97.5 F (36.4 C) 97.8 F (36.6 C) 97.6 F (36.4 C)   TempSrc: Oral Oral Oral   SpO2: 95% 97% 94% 95%  Weight:      Height:        Intake/Output Summary (Last 24 hours) at 10/24/2021 1059 Last data filed at 10/24/2021 0606 Gross per 24 hour  Intake 1277.42 ml  Output 1002 ml  Net 275.42 ml      10/23/2021    5:11 AM 10/22/2021    5:00 PM 10/21/2021   10:13 PM  Last 3 Weights  Weight (lbs) 139 lb 8.8 oz 140 lb 9.6 oz 141 lb 5 oz  Weight (kg) 63.3 kg 63.776 kg 64.1 kg      Telemetry    Sinus rhythm - Personally Reviewed  ECG    9/2 EKG showed ST vs ectopic atrial tachycardia, LBBB - Personally Reviewed  Physical Exam   GEN: No acute distress.    Cardiac: RRR, no murmurs, rubs, or gallops. 2+ radial pulses bilaterally. Respiratory: Normal work of breathing. Clear to auscultation bilaterally. MS: No edema; No deformity. Neuro:  alert and oriented   Psych: Normal affect   Labs    High Sensitivity Troponin:   Recent Labs  Lab 10/21/21 1747 10/21/21 2054 10/22/21 0609 10/22/21 0908  TROPONINIHS 43* 794* 698* 552*     Chemistry Recent Labs  Lab 10/22/21 0908 10/23/21 0217 10/24/21 0248  NA 143 137 140  K 3.4* 3.9 4.4  CL 104 108 108  CO2 '27 23 23  '$ GLUCOSE 152* 104* 99  BUN 7* 19 18  CREATININE 0.77 0.99 0.76  CALCIUM 9.3 9.2 9.4  MG 2.1 2.2 2.2  GFRNONAA >60 57* >60  ANIONGAP '12 6 9    '$ Lipids  Recent Labs  Lab 10/22/21 0609  CHOL 196  TRIG 299*  HDL 55  LDLCALC 81  CHOLHDL 3.6    Hematology Recent Labs  Lab 10/22/21 0557 10/23/21 0217 10/24/21 0248  WBC 5.5 5.2 4.7  RBC 4.69 4.44 4.22  HGB 14.8 13.9 13.3  HCT 45.6 42.6 40.5  MCV 97.2 95.9 96.0  MCH 31.6 31.3 31.5  MCHC 32.5 32.6 32.8  RDW 13.2 13.0 13.2  PLT 189 180 165   Thyroid  Recent Labs  Lab 10/22/21 0609  TSH 8.344*    BNPNo results for input(s): "BNP", "PROBNP" in the last 168 hours.  DDimer  Recent Labs  Lab 10/21/21 1807  DDIMER 0.45     Radiology    No results found.  Cardiac Studies   10/22/2021 Echo  1. Septal, apical and inferior apical hypokinesis . Left ventricular  ejection fraction, by estimation, is 45 to 50%. The left ventricle has  mildly decreased function. The left ventricle has no regional wall motion  abnormalities. The left ventricular  internal cavity size was mildly dilated. There is mild left ventricular  hypertrophy. Left ventricular diastolic parameters are consistent with  Grade I diastolic dysfunction (impaired relaxation).   2. Right ventricular systolic function is normal. The right ventricular  size is normal.   3. The mitral valve is abnormal. Trivial mitral valve regurgitation. No   evidence of mitral stenosis. Moderate mitral annular calcification.   4. The aortic valve is tricuspid. There is mild calcification of the  aortic valve. There is mild thickening of the aortic valve. Aortic valve  regurgitation is mild. Aortic valve sclerosis is present, with no evidence  of aortic valve stenosis.   5. The inferior vena cava is normal in size with greater than 50%  respiratory variability, suggesting right atrial pressure of 3 mmHg.   Patient Profile     81 y.o. female with a hx of  HTN (chart diagnosis, pt denies), COPD (due to [facility] and Nocardia, never smoker), right breast cancer and left breast DCIS underwent bilateral lumpectomies followed by re-excision and radiation therapy, currently on anastrozole. who is being seen for the evaluation of chest pain, with apparent NSTEMI  Assessment & Plan    Principal Problem:   Chest pain, rule out acute myocardial infarction Active Problems:   Hypothyroidism   Bronchiectasis without acute exacerbation + MAIC   Essential hypertension   Malignant neoplasm of upper-outer quadrant of right breast in female, estrogen receptor positive ([place])   UTI (urinary tract infection)   NSTEMI (non-ST elevated myocardial infarction) [facility])   NSTEMI/ACS Patient denies chest pain or dyspnea. Trops up to 794 but downtrending now.  -on hep gtt, lopressor 25 mg BID, ASA 81 mg, atorvastatin 80 mg daily -CT 2020 with LAD calcification -Multiple WMA on TTE suggests MVD -Cor angio plan for today  2.  Cardiomyopathy -TTE with EF 45-50% with septal, apical, inferior WMA -Cor angio plan for today to characterize further  3.  HTN -BP well controlled  4.  HL -continue high dose statin    For questions or updates, please contact [place] Please consult [email or web address] for contact info under        Signed, Angelique Blonder, DO  10/24/2021, 10:59 AM    I have examined the patient and reviewed assessment and plan and discussed with  patient.  Agree with above as stated.    Cath today.  All questions answered. 2+ right radial pulse.   [name]

## 2021-10-24 NOTE — Progress Notes (Signed)
   10/24/21 1000  Mobility  Activity Ambulated independently in hallway  Level of Assistance Independent  Assistive Device None  Distance Ambulated (ft) 550 ft  Activity Response Tolerated well  $Mobility charge 1 Mobility   Mobility Specialist Progress Note Received pt in bed having no complaints and agreeable to mobility. Pt was asymptomatic throughout ambulation and returned to room w/o fault. Left in bed w/ call bell in reach and all needs met.   Lucious Groves Mobility Specialist

## 2021-10-24 NOTE — Interval H&P Note (Signed)
History and Physical Interval Note:  10/24/2021 2:42 PM  [name]  has presented today for surgery, with the diagnosis of NSTEMI.  The various methods of treatment have been discussed with the patient and family. After consideration of risks, benefits and other options for treatment, the patient has consented to  Procedure(s): LEFT HEART CATH AND CORONARY ANGIOGRAPHY (N/A) as a surgical intervention.  The patient's history has been reviewed, patient examined, no change in status, stable for surgery.  I have reviewed the patient's chart and labs.  Questions were answered to the patient's satisfaction.   Cath Lab Visit (complete for each Cath Lab visit)  Clinical Evaluation Leading to the Procedure:   ACS: Yes.    Non-ACS:    Anginal Classification: CCS IV  Anti-ischemic medical therapy: Minimal Therapy (1 class of medications)  Non-Invasive Test Results: No non-invasive testing performed  Prior CABG: No previous CABG        Collier [facility] 10/24/2021. 2:42 PM

## 2021-10-24 NOTE — Progress Notes (Signed)
PROGRESS NOTE    [name]  JXB:[id number] DOB: Jan 21, 1941 DOA: 10/21/2021 PCP: Jonathon Jordan, MD    Chief Complaint  Patient presents with   Chest Pain    Brief Narrative:  No notes on file    Assessment & Plan:  Principal Problem:   Chest pain, rule out acute myocardial infarction Active Problems:   NSTEMI (non-ST elevated myocardial infarction) ([place])   Bronchiectasis without acute exacerbation + MAIC   Essential hypertension   Malignant neoplasm of upper-outer quadrant of right breast in female, estrogen receptor positive ([place])   Hypothyroidism   UTI (urinary tract infection)    Assessment and Plan: * Chest pain, rule out acute myocardial infarction Non-STEMI.   -On admission initial concern for unstable angina.  DDx includes pulmonary dz, though no worsening SOB, no other pulm symptoms worse at this time; malignancy (recurrent breast CA) as cause of CP. PE unlikely given neg d.dimer. Patient ruled in with elevated cardiac enzymes. -2D echo ordered with septal, apical and inferior apical hypokinesis with left ventricular EF of 45 to 50%, left ventricular internal cavity size mildly dilated, mild LVH, grade 1 diastolic dysfunction. -Continue heparin drip.  -Continue Lipitor, aspirin, Lopressor. -Patient seen in consultation by cardiology and patient scheduled for cardiac catheterization today 10/24/2021.   NSTEMI (non-ST elevated myocardial infarction) ([place]) - See chest pain above.  Malignant neoplasm of upper-outer quadrant of right breast in female, estrogen receptor positive ([place])  Continue anastrozole Outpatient follow-up.  Essential hypertension Listed in chart but pt denies history of. Not on any meds prior to admission. -Patient started on Lopressor per cardiology due to non-STEMI. -Blood pressure stable.    Bronchiectasis without acute exacerbation + MAIC Complex pulm history, MAIC followed by [place] -> COPD. But no new cough, SOB.  CXR  today doesn't show any new infiltrates, etc. Cont home inhalers  UTI (urinary tract infection) - Urine cultures pending. -Continue IV Rocephin. -Could likely transition to oral antibiotics postcardiac catheterization and treat for total of 3 to 5 days.  Hypothyroidism - Synthroid.          DVT prophylaxis: Heparin Code Status: Full Family Communication: Updated patient.  No family at bedside. Disposition: To be determined  Status is: Inpatient    Consultants:  Cardiology: Dr. Rudi Rummage 10/22/2021  Procedures:  Chest x-ray 10/21/2021 2D echo 10/22/2021 Cardiac catheterization pending 10/24/2021  Antimicrobials:  IV Rocephin 10/22/2021>>>>>   Subjective: Laying in bed.  On the telephone.  No chest pain.  No shortness of breath.  No abdominal pain.  Anxious to know when her cardiac catheterization is going to be today.   Objective: Vitals:   10/23/21 1930 10/23/21 2324 10/24/21 0404 10/24/21 0825  BP: (!) 121/58 126/66 (!) 128/53   Pulse: 69 67 64   Resp: '20 20 19   '$ Temp: 97.6 F (36.4 C) (!) 97.5 F (36.4 C) 97.8 F (36.6 C) 97.6 F (36.4 C)  TempSrc: Oral Oral Oral Oral  SpO2:  95% 97% 94%  Weight:      Height:        Intake/Output Summary (Last 24 hours) at 10/24/2021 0857 Last data filed at 10/24/2021 0606 Gross per 24 hour  Intake 1277.42 ml  Output 1002 ml  Net 275.42 ml   Filed Weights   10/21/21 2213 10/22/21 1700 10/23/21 0511  Weight: 64.1 kg 63.8 kg 63.3 kg    Examination:  General exam: NAD Respiratory system: CTA B.  No wheezes, no crackles, no rhonchi.  [place] air  movement.  Speaking in full sentences.  Cardiovascular system: RRR no murmurs rubs or gallops.  No JVD.  No lower extremity edema.  Gastrointestinal system: Abdomen is soft, nontender, nondistended, positive bowel sounds.  No rebound.  No guarding.  Central nervous system: Alert and oriented.  Moving extremities spontaneously.  No focal neurological deficits.   Extremities: Symmetric 5  x 5 power. Skin: No rashes, lesions or ulcers Psychiatry: Judgement and insight appear normal. Mood & affect appropriate.     Data Reviewed:   CBC: Recent Labs  Lab 10/21/21 1747 10/22/21 0557 10/23/21 0217 10/24/21 0248  WBC 5.1 5.5 5.2 4.7  HGB 14.2 14.8 13.9 13.3  HCT 43.1 45.6 42.6 40.5  MCV 97.7 97.2 95.9 96.0  PLT 184 189 180 315    Basic Metabolic Panel: Recent Labs  Lab 10/21/21 1747 10/22/21 0908 10/23/21 0217 10/24/21 0248  NA 141 143 137 140  K 3.3* 3.4* 3.9 4.4  CL 107 104 108 108  CO2 '27 27 23 23  '$ GLUCOSE 148* 152* 104* 99  BUN 9 7* 19 18  CREATININE 0.92 0.77 0.99 0.76  CALCIUM 9.3 9.3 9.2 9.4  MG  --  2.1 2.2 2.2    GFR: Estimated Creatinine Clearance: 54.2 mL/min (by C-G formula based on SCr of 0.76 mg/dL).  Liver Function Tests: No results for input(s): "AST", "ALT", "ALKPHOS", "BILITOT", "PROT", "ALBUMIN" in the last 168 hours.  CBG: No results for input(s): "GLUCAP" in the last 168 hours.   No results found for this or any previous visit (from the past 240 hour(s)).       Radiology Studies: ECHOCARDIOGRAM COMPLETE  Result Date: 10/22/2021    ECHOCARDIOGRAM REPORT   Patient Name:   [name] Date of Exam: 10/22/2021 Medical Rec #:  [id number]            Height:       68.0 in Accession #:    [id number]           Weight:       141.3 lb Date of Birth:  21-Jul-1940             BSA:          1.763 m Patient Age:    81 years             BP:           140/74 mmHg Patient Gender: F                    HR:           109 bpm. Exam Location:  Inpatient Procedure: 2D Echo, Cardiac Doppler, Color Doppler and Intracardiac            Opacification Agent Indications:    Chest pain  History:        Patient has no prior history of Echocardiogram examinations.                 COPD; Risk Factors:Hypertension.  Sonographer:    Clayton Lefort RDCS (AE) Referring Phys: [place]  1. Septal, apical and inferior apical hypokinesis . Left  ventricular ejection fraction, by estimation, is 45 to 50%. The left ventricle has mildly decreased function. The left ventricle has no regional wall motion abnormalities. The left ventricular internal cavity size was mildly dilated. There is mild left ventricular hypertrophy. Left ventricular diastolic parameters are consistent with Grade I diastolic dysfunction (impaired relaxation).  2. Right  ventricular systolic function is normal. The right ventricular size is normal.  3. The mitral valve is abnormal. Trivial mitral valve regurgitation. No evidence of mitral stenosis. Moderate mitral annular calcification.  4. The aortic valve is tricuspid. There is mild calcification of the aortic valve. There is mild thickening of the aortic valve. Aortic valve regurgitation is mild. Aortic valve sclerosis is present, with no evidence of aortic valve stenosis.  5. The inferior vena cava is normal in size with greater than 50% respiratory variability, suggesting right atrial pressure of 3 mmHg. FINDINGS  Left Ventricle: Septal, apical and inferior apical hypokinesis. Left ventricular ejection fraction, by estimation, is 45 to 50%. The left ventricle has mildly decreased function. The left ventricle has no regional wall motion abnormalities. Definity contrast agent was given IV to delineate the left ventricular endocardial borders. The left ventricular internal cavity size was mildly dilated. There is mild left ventricular hypertrophy. Left ventricular diastolic parameters are consistent with Grade I  diastolic dysfunction (impaired relaxation). Right Ventricle: The right ventricular size is normal. No increase in right ventricular wall thickness. Right ventricular systolic function is normal. Left Atrium: Left atrial size was normal in size. Right Atrium: Right atrial size was normal in size. Pericardium: There is no evidence of pericardial effusion. Mitral Valve: The mitral valve is abnormal. There is mild thickening of the  mitral valve leaflet(s). There is mild calcification of the mitral valve leaflet(s). Moderate mitral annular calcification. Trivial mitral valve regurgitation. No evidence of mitral valve stenosis. Tricuspid Valve: The tricuspid valve is normal in structure. Tricuspid valve regurgitation is not demonstrated. No evidence of tricuspid stenosis. Aortic Valve: The aortic valve is tricuspid. There is mild calcification of the aortic valve. There is mild thickening of the aortic valve. Aortic valve regurgitation is mild. Aortic valve sclerosis is present, with no evidence of aortic valve stenosis. Aortic valve mean gradient measures 2.0 mmHg. Aortic valve peak gradient measures 3.2 mmHg. Aortic valve area, by VTI measures 2.02 cm. Pulmonic Valve: The pulmonic valve was normal in structure. Pulmonic valve regurgitation is trivial. No evidence of pulmonic stenosis. Aorta: The aortic root is normal in size and structure. Venous: The inferior vena cava is normal in size with greater than 50% respiratory variability, suggesting right atrial pressure of 3 mmHg. IAS/Shunts: No atrial level shunt detected by color flow Doppler.  LEFT VENTRICLE PLAX 2D LVIDd:         4.00 cm   Diastology LVIDs:         2.90 cm   LV e' lateral: 5.33 cm/s LV PW:         1.30 cm LV IVS:        1.30 cm LVOT diam:     2.00 cm LV SV:         32 LV SV Index:   18 LVOT Area:     3.14 cm  IVC IVC diam: 0.90 cm LEFT ATRIUM             Index LA diam:        2.70 cm 1.53 cm/m LA Vol (A2C):   39.1 ml 22.17 ml/m LA Vol (A4C):   31.6 ml 17.92 ml/m LA Biplane Vol: 38.9 ml 22.06 ml/m  AORTIC VALVE AV Area (Vmax):    2.05 cm AV Area (Vmean):   2.03 cm AV Area (VTI):     2.02 cm AV Vmax:           89.10 cm/s AV Vmean:  57.000 cm/s AV VTI:            0.157 m AV Peak Grad:      3.2 mmHg AV Mean Grad:      2.0 mmHg LVOT Vmax:         58.20 cm/s LVOT Vmean:        36.900 cm/s LVOT VTI:          0.101 m LVOT/AV VTI ratio: 0.64  AORTA Ao Root diam: 2.90  cm Ao Asc diam:  3.00 cm  SHUNTS Systemic VTI:  0.10 m Systemic Diam: 2.00 cm Jenkins Rouge MD Electronically signed by Jenkins Rouge MD Signature Date/Time: 10/22/2021/10:46:27 AM    Final         Scheduled Meds:  anastrozole  1 mg Oral Daily   [START ON 10/25/2021] aspirin EC  81 mg Oral Daily   atorvastatin  80 mg Oral Daily   ferrous sulfate  325 mg Oral Q breakfast   fluticasone furoate-vilanterol  1 puff Inhalation Daily   And   umeclidinium bromide  1 puff Inhalation Daily   latanoprost  1 drop Both Eyes QHS   levothyroxine  75 mcg Oral Q0600   metoprolol tartrate  25 mg Oral BID   multivitamin with minerals  1 tablet Oral Daily   sodium chloride flush  3 mL Intravenous Q12H   venlafaxine XR  37.5 mg Oral Daily   Continuous Infusions:  sodium chloride     sodium chloride 1 mL/kg/hr (10/24/21 0505)   cefTRIAXone (ROCEPHIN)  IV 1 g (10/24/21 0827)   heparin 800 Units/hr (10/23/21 0041)     LOS: 2 days    Time spent: 35 minutes    Irine Seal, MD [facility]   To contact the attending provider between 7A-7P or the covering provider during after hours 7P-7A, please log into the web site [email or web address] and access using universal [place] password for that web site. If you do not have the password, please call the hospital operator.  10/24/2021, 8:57 AM

## 2021-10-24 NOTE — Progress Notes (Signed)
SITE AREA: right groin/femoral  SITE PRIOR TO REMOVAL:  LEVEL 0  PRESSURE APPLIED FOR: approximately 20 minutes  MANUAL: yes  PATIENT STATUS DURING PULL: stable  POST PULL SITE:  LEVEL 0  POST PULL INSTRUCTIONS GIVEN: yes  POST PULL PULSES PRESENT: bilateral pedal pulses at +2  DRESSING APPLIED: gauze with tegaderm  BEDREST BEGINS @ 1920   COMMENTS:  Purewick placed by Laqueta Due, RN during sheath removal, pt reminded to keep legs/hips straight and right arm resting

## 2021-10-24 NOTE — Progress Notes (Addendum)
Patient vomited prior to straight cath approx. One hour and twenty minutes following Plavix administration. Per Dr. [place] administer '75MG'$  plavix one time dose, verbal order placed.

## 2021-10-25 ENCOUNTER — Other Ambulatory Visit (HOSPITAL_COMMUNITY): Payer: Self-pay

## 2021-10-25 ENCOUNTER — Encounter (HOSPITAL_COMMUNITY): Payer: Self-pay | Admitting: Cardiology

## 2021-10-25 DIAGNOSIS — I214 Non-ST elevation (NSTEMI) myocardial infarction: Secondary | ICD-10-CM | POA: Diagnosis not present

## 2021-10-25 DIAGNOSIS — R079 Chest pain, unspecified: Secondary | ICD-10-CM | POA: Diagnosis not present

## 2021-10-25 DIAGNOSIS — Z955 Presence of coronary angioplasty implant and graft: Secondary | ICD-10-CM | POA: Diagnosis not present

## 2021-10-25 LAB — CBC
HCT: 42 % (ref 36.0–46.0)
Hemoglobin: 14 g/dL (ref 12.0–15.0)
MCH: 31.8 pg (ref 26.0–34.0)
MCHC: 33.3 g/dL (ref 30.0–36.0)
MCV: 95.5 fL (ref 80.0–100.0)
Platelets: 178 10*3/uL (ref 150–400)
RBC: 4.4 MIL/uL (ref 3.87–5.11)
RDW: 13.2 % (ref 11.5–15.5)
WBC: 9.3 10*3/uL (ref 4.0–10.5)
nRBC: 0 % (ref 0.0–0.2)

## 2021-10-25 LAB — BASIC METABOLIC PANEL
Anion gap: 9 (ref 5–15)
BUN: 14 mg/dL (ref 8–23)
CO2: 22 mmol/L (ref 22–32)
Calcium: 9.4 mg/dL (ref 8.9–10.3)
Chloride: 106 mmol/L (ref 98–111)
Creatinine, Ser: 0.72 mg/dL (ref 0.44–1.00)
GFR, Estimated: >60 mL/min (ref >60)
Glucose, Bld: 99 mg/dL (ref 70–99)
Potassium: 3.6 mmol/L (ref 3.5–5.1)
Sodium: 137 mmol/L (ref 135–145)

## 2021-10-25 LAB — VITAMIN B12: Vitamin B-12: 675 pg/mL (ref 180–914)

## 2021-10-25 MED ORDER — METOPROLOL SUCCINATE ER 50 MG PO TB24
50.0000 mg | ORAL_TABLET | Freq: Every day | ORAL | Status: DC
  Administered 2021-10-26: 50 mg via ORAL
  Filled 2021-10-25: qty 1

## 2021-10-25 MED FILL — Clopidogrel Bisulfate Tab 75 MG (Base Equiv): ORAL | Qty: 8 | Status: AC

## 2021-10-25 MED FILL — Heparin Sod (Porcine)-NaCl IV Soln 1000 Unit/500ML-0.9%: INTRAVENOUS | Qty: 1000 | Status: AC

## 2021-10-25 NOTE — Progress Notes (Addendum)
Rounding Note    Patient Name: [name] Date of Encounter: 10/25/2021  [place] Cardiologist: Elouise Munroe, MD   Subjective   Patient tearful about her experience in general.  Nothing in particular was unpleasant.  She just felt somewhat scared that all of this happened.  No particular physical complaints this morning.  No pain in the hand or swelling.  Inpatient Medications    Scheduled Meds:  anastrozole  1 mg Oral Daily   aspirin EC  81 mg Oral Daily   atorvastatin  80 mg Oral Daily   clopidogrel  75 mg Oral Q breakfast   ferrous sulfate  325 mg Oral Q breakfast   fluticasone furoate-vilanterol  1 puff Inhalation Daily   And   umeclidinium bromide  1 puff Inhalation Daily   heparin  5,000 Units Subcutaneous Q8H   latanoprost  1 drop Both Eyes QHS   levothyroxine  75 mcg Oral Q0600   metoprolol tartrate  25 mg Oral BID   multivitamin with minerals  1 tablet Oral Daily   sodium chloride flush  3 mL Intravenous Q12H   venlafaxine XR  37.5 mg Oral Daily   Continuous Infusions:  sodium chloride     cefTRIAXone (ROCEPHIN)  IV 1 g (10/25/21 0851)   PRN Meds: sodium chloride, acetaminophen, acetaminophen, albuterol, ondansetron (ZOFRAN) IV, sodium chloride flush   Vital Signs    Vitals:   10/24/21 2100 10/24/21 2138 10/24/21 2300 10/25/21 0447  BP: 134/70 135/66 122/68 107/78  Pulse: 76 73 67 76  Resp: '19 20 15 16  '$ Temp:   99 F (37.2 C) 98.3 F (36.8 C)  TempSrc:   Oral Oral  SpO2: 96% 95% 92% 96%  Weight:      Height:        Intake/Output Summary (Last 24 hours) at 10/25/2021 0852 Last data filed at 10/25/2021 0425 Gross per 24 hour  Intake 2050.94 ml  Output 1100 ml  Net 950.94 ml      10/23/2021    5:11 AM 10/22/2021    5:00 PM 10/21/2021   10:13 PM  Last 3 Weights  Weight (lbs) 139 lb 8.8 oz 140 lb 9.6 oz 141 lb 5 oz  Weight (kg) 63.3 kg 63.776 kg 64.1 kg      Telemetry    Normal sinus rhythm- Personally Reviewed  ECG     Normal sinus rhythm, minimal ST changes in aVL-likely related to her diagonal angioplasty- Personally Reviewed  Physical Exam   GEN: No acute distress.   Neck: No JVD Cardiac: RRR, no murmurs, rubs, or gallops.  Respiratory: Clear to auscultation bilaterally. GI: Soft, nontender, non-distended  MS: No edema; No deformity.  Unable to palpate right radial pulse.  When ulnar artery is compressed, no waveform in the right radial.  Suspect occluded right radial artery.   No right groin hematoma.  2+ right posterior tibial pulse Neuro:  Nonfocal  Psych: Normal affect   Labs    High Sensitivity Troponin:   Recent Labs  Lab 10/21/21 1747 10/21/21 2054 10/22/21 0609 10/22/21 0908  TROPONINIHS 43* 794* 698* 552*     Chemistry Recent Labs  Lab 10/22/21 0908 10/23/21 0217 10/24/21 0248 10/25/21 0559  NA 143 137 140 137  K 3.4* 3.9 4.4 3.6  CL 104 108 108 106  CO2 '27 23 23 22  '$ GLUCOSE 152* 104* 99 99  BUN 7* '19 18 14  '$ CREATININE 0.77 0.99 0.76 0.72  CALCIUM 9.3 9.2  9.4 9.4  MG 2.1 2.2 2.2  --   GFRNONAA >60 57* >60 >60  ANIONGAP '12 6 9 9    '$ Lipids  Recent Labs  Lab 10/22/21 0609  CHOL 196  TRIG 299*  HDL 55  LDLCALC 81  CHOLHDL 3.6    Hematology Recent Labs  Lab 10/23/21 0217 10/24/21 0248 10/25/21 0559  WBC 5.2 4.7 9.3  RBC 4.44 4.22 4.40  HGB 13.9 13.3 14.0  HCT 42.6 40.5 42.0  MCV 95.9 96.0 95.5  MCH 31.3 31.5 31.8  MCHC 32.6 32.8 33.3  RDW 13.0 13.2 13.2  PLT 180 165 178   Thyroid  Recent Labs  Lab 10/22/21 0609  TSH 8.344*    BNPNo results for input(s): "BNP", "PROBNP" in the last 168 hours.  DDimer  Recent Labs  Lab 10/21/21 1807  DDIMER 0.45     Radiology    CARDIAC CATHETERIZATION  Result Date: 10/24/2021   1st Diag lesion is 50% stenosed.   Prox LAD to Mid LAD lesion is 95% stenosed.   Mid LAD to Dist LAD lesion is 40% stenosed.   A drug-eluting stent was successfully placed using a SYNERGY XD 2.50X28.   Balloon angioplasty was  performed using a BALLN SAPPHIRE 2.0X12.   Post intervention, there is a 0% residual stenosis.   Post intervention, there is a 0% residual stenosis.   LV end diastolic pressure is normal. Single vessel obstructive disease with complex bifurcation LAD/first diagonal stenosis [place] class 1,1,1. Normal LVEDP Successful PCI of the LAD/diagonal bifurcation with DES in the LAD and POBA of the diagonal side branch Plan: DAPT for one year. Anticipate DC tomorrow if stable.    Cardiac Studies   Cath films reviewed.  Successful stenting of the LAD and angioplasty of the diagonal.  Patient Profile     81 y.o. female with NSTEMI due to bifurcation LAD/diagonal disease.  Status post LAD PCI.  Assessment & Plan    CAD/NSTEMI: Continue dual antiplatelet therapy.  Plavix was chosen as the patient is small and somewhat frail.  Trying to minimize bleeding risk.  She will need lipid-lowering therapy.  Research coordinator to talk about PCSK9 inhibitor trial.  Blood pressure well controlled.  Would not add beta-blocker or ACE inhibitor due to stage III CKD and borderline blood pressures.  If blood pressure increases and renal function stays stable, could consider adding as an outpatient.  Hyperlipidemia: Continue high-dose statin.  LDL 81 in the setting of MI.  Would like to see LDL closer to 55.  Asymptomatic right radial artery occlusion.  She did have a radial loop which forced use of the right groin.  No right hand symptoms.  Palpable right ulnar pulse.  No further management.  Would use femoral access if cardiac cath was needed in the future.  She will walk with cardiac rehab today.  Plan for discharge later today.     For questions or updates, please contact [place] Please consult [email or web address] for contact info under        Signed, Larae Grooms, MD  10/25/2021, 8:52 AM

## 2021-10-25 NOTE — Progress Notes (Signed)
Mobility Specialist Progress Note    10/25/21 0949  Mobility  Activity Ambulated independently in hallway  Level of Assistance Standby assist, set-up cues, supervision of patient - no hands on  Assistive Device None  Distance Ambulated (ft) 450 ft  Activity Response Tolerated well  $Mobility charge 1 Mobility   Pre-Mobility: 96 HR, 95% SpO2 During Mobility: 97 HR Post-Mobility: 88 HR, 96% SpO2  Pt received sitting EOB and agreeable. No complaints on walk. Returned to BR. Encouraged pt to pull string when finished for assistance.    Hildred Alamin Mobility Specialist

## 2021-10-25 NOTE — Progress Notes (Signed)
TR BAND REMOVAL   LOCATION:   Right Radial   DEFLATED PER PROTOCOL: Yes      TIME BAND OFF / DRESSING APPLIED:   2315    SITE UPON ARRIVAL:  WDL   SITE AFTER BAND REMOVAL: WDL   CIRCULATION SENSATION AND MOVEMENT:    WDL  COMMENTS:   Care instructions

## 2021-10-25 NOTE — Care Management Important Message (Signed)
Important Message  Patient Details  Name: [name] MRN: [id number] Date of Birth: December 15, 1940   Medicare Important Message Given:  Yes     Orbie Pyo 10/25/2021, 2:14 PM

## 2021-10-25 NOTE — Progress Notes (Signed)
CARDIAC REHAB PHASE I        Pt resting in bed. She is feeling good this morning and walked with mobility team, reports she tolerated well with no CP or SOB. Post stent/MI teaching including MI booklet, risk factors, site care, heart healthy diet, restrictions, exercise guidelines,antiplatelet therapy importance, and CRP2 reviewed. Will refer to [facility] for CRP2. All questions and concerns addressed. Plan for home tomorrow. Will continue to follow.   [id number]  Vanessa Barbara, RN BSN 10/25/2021 11:04 AM

## 2021-10-25 NOTE — Progress Notes (Signed)
Mobility Specialist Progress Note    10/25/21 1715  Mobility  Activity Ambulated with assistance in hallway  Level of Assistance Contact guard assist, steadying assist  Assistive Device Other (Comment) (HHA)  Distance Ambulated (ft) 420 ft  Activity Response Tolerated well  $Mobility charge 1 Mobility   Pt received sitting EOB and agreeable. No complaints on walk. Returned to sitting EOB with call bell in reach.    Hildred Alamin Mobility Specialist

## 2021-10-25 NOTE — Progress Notes (Signed)
PROGRESS NOTE    [name]  JHE:[id number] DOB: 09-07-1940 DOA: 10/21/2021 PCP: Jonathon Jordan, MD   Brief Narrative: 81 year old past medical history significant for hypertension, COPD (due to [facility] and nocardia), right breast cancer left breast DCIS underwent bilateral lumpectomy followed by radiation therapy on anastrozole, presents with heart palpitation and right side chest pain.  Patient admitted with concern for unstable angina, 2D echo show septal, apical and inferior apical hypokinesis with ejection fraction 45 to 50%.  Patient was a started on heparin drip and underwent cardiac catheterization 10/24/2021, which showed: Single vessel obstructive disease with complex bifurcation LAD/first diagonal stenosis Medina class 1,1,1. Normal LVEDP. Successful PCI of the LAD/diagonal bifurcation with DES in the LAD and POBA of the diagonal side branch      Assessment & Plan:   Principal Problem:   Chest pain, rule out acute myocardial infarction Active Problems:   NSTEMI (non-ST elevated myocardial infarction) ([place])   Bronchiectasis without acute exacerbation + MAIC   Essential hypertension   Malignant neoplasm of upper-outer quadrant of right breast in female, estrogen receptor positive ([place])   Hypothyroidism   UTI (urinary tract infection)   1-Non -STEMI; CAD;  Patient presented with unstable angina, D-dimer negative, troponin elevated. Echo show ejection fraction 45 to 50%, septal, apical and inferior apical hypokinesis. Underwent heart cath 9/5:Single vessel obstructive disease with complex bifurcation LAD/first diagonal stenosis Medina class 1,1,1. Normal LVEDP. Successful PCI of the LAD/diagonal bifurcation with DES in the LAD and POBA of the diagonal side branch. -Continue with aspirin, Plavix, Lipitor and Lopressor   2-Malignant neoplasm of upper outer quadrant right breast: Continue with anastrozole  3-Hypertension: Continue with Lopressor 4-Bronchiectasis without  acute exacerbation + MAIC Chest x-ray negative for any new infiltrates.  Continue with home inhaler  UTI: Continue with IV ceftriaxone Urine culture not sent  Acute metabolic encephalopathy: She reports that she has been feeling confused over the last couple of days.  She is not feeling well today. Could be in the setting of UTI. Check B12 [id number]  Estimated body mass index is 21.22 kg/m as calculated from the following:   Height as of this encounter: '5\' 8"'$  (1.727 m).   Weight as of this encounter: 63.3 kg.   DVT prophylaxis: Heparin Code Status: Full code Family Communication: Care discussed with patient Disposition Plan:  Status is: Inpatient Remains inpatient appropriate because: Hopefully home tomorrow    Consultants:  Cardiology  Procedures:  Heart cath  Antimicrobials:  Ceftriaxone  Subjective: She is alert, report feeling confuse, for last several day. She doesn't feel well today. She doesn't feel ready to go home. She denies chest pain   Objective: Vitals:   10/24/21 2100 10/24/21 2138 10/24/21 2300 10/25/21 0447  BP: 134/70 135/66 122/68 107/78  Pulse: 76 73 67 76  Resp: '19 20 15 16  '$ Temp:   99 F (37.2 C) 98.3 F (36.8 C)  TempSrc:   Oral Oral  SpO2: 96% 95% 92% 96%  Weight:      Height:        Intake/Output Summary (Last 24 hours) at 10/25/2021 0735 Last data filed at 10/25/2021 0425 Gross per 24 hour  Intake 2050.94 ml  Output 1100 ml  Net 950.94 ml   Filed Weights   10/21/21 2213 10/22/21 1700 10/23/21 0511  Weight: 64.1 kg 63.8 kg 63.3 kg    Examination:  General exam: Appears calm and comfortable  Respiratory system: Clear to auscultation. Respiratory effort normal. Cardiovascular system:  S1 & S2 heard, RRR. No JVD, murmurs, rubs, gallops or clicks. No pedal edema. Gastrointestinal system: Abdomen is nondistended, soft and nontender. No organomegaly or masses felt. Normal bowel sounds heard. Central nervous system: Alert and oriented. No  focal neurological deficits. Extremities: Symmetric 5 x 5 power.   Data Reviewed: I have personally reviewed following labs and imaging studies  CBC: Recent Labs  Lab 10/21/21 1747 10/22/21 0557 10/23/21 0217 10/24/21 0248 10/25/21 0559  WBC 5.1 5.5 5.2 4.7 9.3  HGB 14.2 14.8 13.9 13.3 14.0  HCT 43.1 45.6 42.6 40.5 42.0  MCV 97.7 97.2 95.9 96.0 95.5  PLT 184 189 180 165 209   Basic Metabolic Panel: Recent Labs  Lab 10/21/21 1747 10/22/21 0908 10/23/21 0217 10/24/21 0248 10/25/21 0559  NA 141 143 137 140 137  K 3.3* 3.4* 3.9 4.4 3.6  CL 107 104 108 108 106  CO2 '27 27 23 23 22  '$ GLUCOSE 148* 152* 104* 99 99  BUN 9 7* '19 18 14  '$ CREATININE 0.92 0.77 0.99 0.76 0.72  CALCIUM 9.3 9.3 9.2 9.4 9.4  MG  --  2.1 2.2 2.2  --    GFR: Estimated Creatinine Clearance: 54.2 mL/min (by C-G formula based on SCr of 0.72 mg/dL). Liver Function Tests: No results for input(s): "AST", "ALT", "ALKPHOS", "BILITOT", "PROT", "ALBUMIN" in the last 168 hours. No results for input(s): "LIPASE", "AMYLASE" in the last 168 hours. No results for input(s): "AMMONIA" in the last 168 hours. Coagulation Profile: No results for input(s): "INR", "PROTIME" in the last 168 hours. Cardiac Enzymes: No results for input(s): "CKTOTAL", "CKMB", "CKMBINDEX", "TROPONINI" in the last 168 hours. BNP (last 3 results) No results for input(s): "PROBNP" in the last 8760 hours. HbA1C: No results for input(s): "HGBA1C" in the last 72 hours. CBG: No results for input(s): "GLUCAP" in the last 168 hours. Lipid Profile: No results for input(s): "CHOL", "HDL", "LDLCALC", "TRIG", "CHOLHDL", "LDLDIRECT" in the last 72 hours. Thyroid Function Tests: No results for input(s): "TSH", "T4TOTAL", "FREET4", "T3FREE", "THYROIDAB" in the last 72 hours. Anemia Panel: No results for input(s): "VITAMINB12", "FOLATE", "FERRITIN", "TIBC", "IRON", "RETICCTPCT" in the last 72 hours. Sepsis Labs: No results for input(s): "PROCALCITON",  "LATICACIDVEN" in the last 168 hours.  Recent Results (from the past 240 hour(s))  MRSA Next Gen by PCR, Nasal     Status: None   Collection Time: 10/24/21  8:11 PM   Specimen: Nasal Mucosa; Nasal Swab  Result Value Ref Range Status   MRSA by PCR Next Gen NOT DETECTED NOT DETECTED Final    Comment: (NOTE) The GeneXpert MRSA Assay (FDA approved for NASAL specimens only), is one component of a comprehensive MRSA colonization surveillance program. It is not intended to diagnose MRSA infection nor to guide or monitor treatment for MRSA infections. Test performance is not FDA approved in patients less than 55 years old. Performed at [facility], [place] [address], [place], [place] [address]          Radiology Studies: CARDIAC CATHETERIZATION  Result Date: 10/24/2021   1st Diag lesion is 50% stenosed.   Prox LAD to Mid LAD lesion is 95% stenosed.   Mid LAD to Dist LAD lesion is 40% stenosed.   A drug-eluting stent was successfully placed using a SYNERGY XD 2.50X28.   Balloon angioplasty was performed using a BALLN SAPPHIRE 2.0X12.   Post intervention, there is a 0% residual stenosis.   Post intervention, there is a 0% residual stenosis.   LV  end diastolic pressure is normal. Single vessel obstructive disease with complex bifurcation LAD/first diagonal stenosis Medina class 1,1,1. Normal LVEDP Successful PCI of the LAD/diagonal bifurcation with DES in the LAD and POBA of the diagonal side branch Plan: DAPT for one year. Anticipate DC tomorrow if stable.        Scheduled Meds:  anastrozole  1 mg Oral Daily   aspirin EC  81 mg Oral Daily   atorvastatin  80 mg Oral Daily   clopidogrel  75 mg Oral Q breakfast   ferrous sulfate  325 mg Oral Q breakfast   fluticasone furoate-vilanterol  1 puff Inhalation Daily   And   umeclidinium bromide  1 puff Inhalation Daily   heparin  5,000 Units Subcutaneous Q8H   latanoprost  1 drop Both Eyes QHS   levothyroxine  75 mcg Oral Q0600    metoprolol tartrate  25 mg Oral BID   multivitamin with minerals  1 tablet Oral Daily   sodium chloride flush  3 mL Intravenous Q12H   venlafaxine XR  37.5 mg Oral Daily   Continuous Infusions:  sodium chloride     cefTRIAXone (ROCEPHIN)  IV 200 mL/hr at 10/25/21 0425     LOS: 3 days   Time spent: 35 minutes.     Elmarie Shiley, MD [facility]   If 7PM-7AM, please contact night-coverage [email or web address]  10/25/2021, 7:35 AM

## 2021-10-26 ENCOUNTER — Encounter (HOSPITAL_BASED_OUTPATIENT_CLINIC_OR_DEPARTMENT_OTHER): Payer: Self-pay | Admitting: *Deleted

## 2021-10-26 ENCOUNTER — Emergency Department (HOSPITAL_BASED_OUTPATIENT_CLINIC_OR_DEPARTMENT_OTHER): Payer: Medicare PPO

## 2021-10-26 ENCOUNTER — Emergency Department (HOSPITAL_BASED_OUTPATIENT_CLINIC_OR_DEPARTMENT_OTHER)
Admission: EM | Admit: 2021-10-26 | Discharge: 2021-10-26 | Disposition: A | Discharge status: Home / Self Care | Payer: Medicare PPO | Attending: Emergency Medicine | Admitting: Emergency Medicine

## 2021-10-26 ENCOUNTER — Other Ambulatory Visit: Payer: Self-pay

## 2021-10-26 DIAGNOSIS — S5011XA Contusion of right forearm, initial encounter: Secondary | ICD-10-CM | POA: Diagnosis not present

## 2021-10-26 DIAGNOSIS — S0990XA Unspecified injury of head, initial encounter: Secondary | ICD-10-CM | POA: Diagnosis not present

## 2021-10-26 DIAGNOSIS — Z7982 Long term (current) use of aspirin: Secondary | ICD-10-CM | POA: Insufficient documentation

## 2021-10-26 DIAGNOSIS — M47812 Spondylosis without myelopathy or radiculopathy, cervical region: Secondary | ICD-10-CM | POA: Diagnosis not present

## 2021-10-26 DIAGNOSIS — S5001XA Contusion of right elbow, initial encounter: Secondary | ICD-10-CM | POA: Diagnosis not present

## 2021-10-26 DIAGNOSIS — S59901A Unspecified injury of right elbow, initial encounter: Secondary | ICD-10-CM | POA: Diagnosis not present

## 2021-10-26 DIAGNOSIS — M4802 Spinal stenosis, cervical region: Secondary | ICD-10-CM | POA: Diagnosis not present

## 2021-10-26 DIAGNOSIS — R079 Chest pain, unspecified: Secondary | ICD-10-CM | POA: Diagnosis not present

## 2021-10-26 DIAGNOSIS — Z7902 Long term (current) use of antithrombotics/antiplatelets: Secondary | ICD-10-CM | POA: Insufficient documentation

## 2021-10-26 DIAGNOSIS — Z955 Presence of coronary angioplasty implant and graft: Secondary | ICD-10-CM | POA: Diagnosis not present

## 2021-10-26 DIAGNOSIS — I2510 Atherosclerotic heart disease of native coronary artery without angina pectoris: Secondary | ICD-10-CM | POA: Diagnosis not present

## 2021-10-26 DIAGNOSIS — W010XXA Fall on same level from slipping, tripping and stumbling without subsequent striking against object, initial encounter: Secondary | ICD-10-CM | POA: Diagnosis not present

## 2021-10-26 DIAGNOSIS — S0083XA Contusion of other part of head, initial encounter: Secondary | ICD-10-CM | POA: Diagnosis not present

## 2021-10-26 DIAGNOSIS — J322 Chronic ethmoidal sinusitis: Secondary | ICD-10-CM | POA: Diagnosis not present

## 2021-10-26 DIAGNOSIS — I10 Essential (primary) hypertension: Secondary | ICD-10-CM | POA: Diagnosis not present

## 2021-10-26 DIAGNOSIS — I214 Non-ST elevation (NSTEMI) myocardial infarction: Secondary | ICD-10-CM | POA: Diagnosis not present

## 2021-10-26 DIAGNOSIS — S0003XA Contusion of scalp, initial encounter: Secondary | ICD-10-CM | POA: Diagnosis not present

## 2021-10-26 LAB — CBC
HCT: 39.7 % (ref 36.0–46.0)
Hemoglobin: 13.1 g/dL (ref 12.0–15.0)
MCH: 32 pg (ref 26.0–34.0)
MCHC: 33 g/dL (ref 30.0–36.0)
MCV: 96.8 fL (ref 80.0–100.0)
Platelets: 159 10*3/uL (ref 150–400)
RBC: 4.1 MIL/uL (ref 3.87–5.11)
RDW: 13.6 % (ref 11.5–15.5)
WBC: 6.3 10*3/uL (ref 4.0–10.5)
nRBC: 0 % (ref 0.0–0.2)

## 2021-10-26 LAB — BASIC METABOLIC PANEL
Anion gap: 7 (ref 5–15)
BUN: 25 mg/dL — ABNORMAL HIGH (ref 8–23)
CO2: 27 mmol/L (ref 22–32)
Calcium: 10 mg/dL (ref 8.9–10.3)
Chloride: 102 mmol/L (ref 98–111)
Creatinine, Ser: 0.9 mg/dL (ref 0.44–1.00)
GFR, Estimated: >60 mL/min (ref >60)
Glucose, Bld: 99 mg/dL (ref 70–99)
Potassium: 4 mmol/L (ref 3.5–5.1)
Sodium: 136 mmol/L (ref 135–145)

## 2021-10-26 LAB — APTT: aPTT: 30 seconds (ref 24–36)

## 2021-10-26 LAB — LIPOPROTEIN A (LPA): Lipoprotein (a): 23.5 nmol/L (ref <75.0)

## 2021-10-26 LAB — PROTIME-INR
INR: 1.1 (ref 0.8–1.2)
Prothrombin Time: 13.6 seconds (ref 11.4–15.2)

## 2021-10-26 MED ORDER — METOPROLOL SUCCINATE ER 50 MG PO TB24: 50.0000 mg | ORAL_TABLET | Freq: Every day | ORAL | 3 refills | Status: DC

## 2021-10-26 MED ORDER — ASPIRIN 81 MG PO TBEC: 81.0000 mg | DELAYED_RELEASE_TABLET | Freq: Every day | ORAL | 12 refills | Status: DC

## 2021-10-26 MED ORDER — CEPHALEXIN 500 MG PO CAPS: 500.0000 mg | ORAL_CAPSULE | Freq: Four times a day (QID) | ORAL | 0 refills | Status: AC

## 2021-10-26 MED ORDER — CLOPIDOGREL BISULFATE 75 MG PO TABS: 75.0000 mg | ORAL_TABLET | Freq: Every day | ORAL | 6 refills | Status: DC

## 2021-10-26 MED ORDER — ATORVASTATIN CALCIUM 80 MG PO TABS: 80.0000 mg | ORAL_TABLET | Freq: Every day | ORAL | 3 refills | Status: DC

## 2021-10-26 NOTE — ED Notes (Signed)
Patient transported to CT 

## 2021-10-26 NOTE — Discharge Instructions (Signed)
Today you were seen in the emergency department for your fall.    In the emergency department you had CT scans and x-rays that were reassuring.    At home, please use Tylenol and ice to help with the pain and swelling.    Follow-up with your primary doctor in 2-3 days regarding your visit.    Return immediately to the emergency department if you experience any of the following: Severe headaches, vomiting, numbness or weakness, or any other concerning symptoms.    Thank you for visiting our Emergency Department. It was a pleasure taking care of you today.

## 2021-10-26 NOTE — Progress Notes (Signed)
Discharge instructions reviewed with pt. Copy of instructions given to pt, informed scripts sent to her pharmacy and will need to be picked up.  Discussed cath site care and when to call MD, handouts provided.  Pt d/c'd via wheelchair with belongings, to discharge lounge, pt's sister coming at approx 12:30 pt states.        Escorted by staff.

## 2021-10-26 NOTE — Progress Notes (Signed)
CARDIAC REHAB PHASE I   PRE:  Rate/Rhythm: 87 SR   BP:  Sitting: 131/88      SaO2: 96 RA   MODE:  Ambulation: 150 ft   POST:  Rate/Rhythm: 110 ST   BP:  Sitting: 90/68      SaO2: 96 RA   Ambulated in hall stand by assist only. Tolerated well with no CP or SOB. Returned to room to chair with call bell and bedside table in reach. No questions or concerns regarding teaching provided yesterday. Plan for home today.   0900-0930  Vanessa Barbara, RN BSN 10/26/2021 9:23 AM    87 SR

## 2021-10-26 NOTE — Discharge Instructions (Signed)
Follow fluid restriction 1.8 L in 24 hours.

## 2021-10-26 NOTE — ED Triage Notes (Signed)
Pt was discharged from the hospital today after having a cardiac stent placed.  Pt reports that she tripped and fell on LVP floor and struck her head and her right arm.  Pt has hematoma on head.  Pt states that she is on a blood thinner but does not know the name of it. Pt denies any LOC.  Pt is alert and oriented.

## 2021-10-26 NOTE — Progress Notes (Signed)
Mobility Specialist Progress Note    10/26/21 0934  Mobility  Activity Ambulated independently in hallway  Level of Assistance Independent  Assistive Device None  Distance Ambulated (ft) 420 ft  Activity Response Tolerated well  $Mobility charge 1 Mobility   Pre-Mobility: 81 HR, 96% SpO2 During Mobility: 102 HR, 95% SpO2 Post-Mobility: 81 HR, 96% SpO2  Pt received in chair and agreeable. No complaints on walk. Returned to chair with call bell in reach.    Hildred Alamin Mobility Specialist

## 2021-10-26 NOTE — Discharge Summary (Addendum)
Physician Discharge Summary   Patient: [name] MRN: [id number] DOB: 02-06-1941  Admit date:     10/21/2021  Discharge date: 10/26/21  Discharge Physician: Elmarie Shiley   PCP: Jonathon Jordan, MD   Recommendations at discharge:   Follow up with cardiology afer Non STEMI  Discharge Diagnoses: Principal Problem:   Chest pain, rule out acute myocardial infarction Active Problems:   NSTEMI (non-ST elevated myocardial infarction) ([place])   Bronchiectasis without acute exacerbation + MAIC   Essential hypertension   Malignant neoplasm of upper-outer quadrant of right breast in female, estrogen receptor positive ([place])   Hypothyroidism   UTI (urinary tract infection)   Status post coronary artery stent placement  Resolved Problems:   * No resolved hospital problems. [facility] Course: 81 year old past medical history significant for hypertension, COPD (due to [facility] and nocardia), right breast cancer left breast DCIS underwent bilateral lumpectomy followed by radiation therapy on anastrozole, presents with heart palpitation and right side chest pain.   Patient admitted with concern for unstable angina, 2D echo show septal, apical and inferior apical hypokinesis with ejection fraction 45 to 50%.  Patient was a started on heparin drip and underwent cardiac catheterization 10/24/2021, which showed: Single vessel obstructive disease with complex bifurcation LAD/first diagonal stenosis Medina class 1,1,1. Normal LVEDP. Successful PCI of the LAD/diagonal bifurcation with DES in the LAD and POBA of the diagonal side branch    Assessment and Plan: 1-Non -STEMI; CAD;  Patient presented with unstable angina, D-dimer negative, troponin elevated. Echo show ejection fraction 45 to 50%, septal, apical and inferior apical hypokinesis. Underwent heart cath 9/5:Single vessel obstructive disease with complex bifurcation LAD/first diagonal stenosis Medina class 1,1,1. Normal LVEDP. Successful PCI  of the LAD/diagonal bifurcation with DES in the LAD and POBA of the diagonal side branch. -Continue with aspirin, Plavix, Lipitor and Lopressor  stable for discharge    2-Malignant neoplasm of upper outer quadrant right breast: Continue with anastrozole   3-Hypertension: Continue with Lopressor 4-Bronchiectasis without acute exacerbation + History of MAIC Chest x-ray negative for any new infiltrates.  Continue with home inhaler   UTI: Treated  with IV ceftriaxone. Discharge one more day oral antibiotics.  Urine culture not sent   Acute metabolic encephalopathy: She reports that she has been feeling confused over the last couple of days.  She is not feeling well today. Present on admission.  Could be in the setting of UTI.  B12 675  feels better.   Estimated body mass index is 21.22 kg/m as calculated from the following:   Height as of this encounter: '5\' 8"'$  (1.727 m).   Weight as of this encounter: 63.3 kg.          Consultants: cardiology  Procedures performed: cath  Disposition: Home Diet recommendation:  Discharge Diet Orders (From admission, onward)     Start     Ordered   10/26/21 0000  Diet - low sodium heart healthy        10/26/21 0857           Cardiac diet DISCHARGE MEDICATION: Allergies as of 10/26/2021       Reactions   Augmentin [amoxicillin-pot Clavulanate] Nausea And Vomiting   .Marland KitchenHas patient had a PCN reaction causing immediate rash, facial/tongue/throat swelling, SOB or lightheadedness with hypotension: No Has patient had a PCN reaction causing severe rash involving mucus membranes or skin necrosis: No Has patient had a PCN reaction that required hospitalization No Has patient had a  PCN reaction occurring within the last 10 years: No If all of the above answers are "NO", then may proceed with Cephalosporin use.   Black Cohosh Nausea And Vomiting   Severe GI Upset, sweats   Codeine Nausea And Vomiting   Severe GI upset, sweats   Hyoscyamine  Other (See Comments)   Cramps, made urinary symptoms worse   Oxybutynin Chloride Other (See Comments)   Cramps, made urinary symptoms worse   Minocycline Other (See Comments)   Scratchy tongue, swollen lips        Medication List     STOP taking these medications    Krill Oil 350 MG Caps   Naproxen Sodium 220 MG Caps       TAKE these medications    albuterol 108 (90 Base) MCG/ACT inhaler Commonly known as: VENTOLIN HFA Inhale 2 puffs into the lungs every 6 (six) hours as needed for wheezing or shortness of breath.   anastrozole 1 MG tablet Commonly known as: ARIMIDEX Take 1 tablet (1 mg total) by mouth daily.   ascorbic acid 500 MG tablet Commonly known as: VITAMIN C Take 500 mg by mouth daily.   aspirin EC 81 MG tablet Take 1 tablet (81 mg total) by mouth daily. Swallow whole.   atorvastatin 80 MG tablet Commonly known as: LIPITOR Take 1 tablet (80 mg total) by mouth daily.   azelastine 0.1 % nasal spray Commonly known as: ASTELIN 1-2 puffs each nostril twice daily if needed What changed:  how much to take how to take this when to take this reasons to take this additional instructions   benzonatate 200 MG capsule Commonly known as: TESSALON Take 1 capsule (200 mg total) by mouth 3 (three) times daily as needed for cough.   Calcium/Magnesium/Zinc Tabs Take 3 tablets by mouth at bedtime.   cephALEXin 500 MG capsule Commonly known as: KEFLEX Take 1 capsule (500 mg total) by mouth 4 (four) times daily for 1 day.   clopidogrel 75 MG tablet Commonly known as: PLAVIX Take 1 tablet (75 mg total) by mouth daily with breakfast.   CoQ-10 100 MG Caps Take 100 mg by mouth daily.   Euthyrox 75 MCG tablet Generic drug: levothyroxine Take 75 mcg by mouth every morning. What changed: Another medication with the same name was removed. Continue taking this medication, and follow the directions you see here.   ferrous sulfate 325 (65 FE) MG tablet Take 325 mg  by mouth daily with breakfast.   fluticasone 50 MCG/ACT nasal spray Commonly known as: Flonase Place 2 sprays into both nostrils daily. What changed:  when to take this reasons to take this   folic acid 619 MCG tablet Commonly known as: FOLVITE Take 400 mcg by mouth daily.   latanoprost 0.005 % ophthalmic solution Commonly known as: XALATAN Place 1 drop into both eyes at bedtime.   metoprolol succinate 50 MG 24 hr tablet Commonly known as: TOPROL-XL Take 1 tablet (50 mg total) by mouth daily. Take with or immediately following a meal.   multivitamin tablet Take 1 tablet by mouth daily.   NONFORMULARY OR COMPOUNDED ITEM [facility] - Antifungal topical - Terbinafine 3%, Fluconazole 2%, Tea Tree Oil 5%, Urea 10%, Ibuprofen 2%, + Itraconazole 3%, in #77m DMSO suspension. Apply to affected toenail(s) once at bedtime or twice daily. What changed:  how much to take how to take this when to take this reasons to take this   pyridOXINE 100 MG tablet Commonly known as: VITAMIN B6  Take 100 mg by mouth daily.   Trelegy Ellipta 100-62.5-25 MCG/ACT Aepb Generic drug: Fluticasone-Umeclidin-Vilant Inhale 1 puff into the lungs daily. What changed:  when to take this reasons to take this   venlafaxine XR 37.5 MG 24 hr capsule Commonly known as: EFFEXOR-XR Take 37.5 mg by mouth daily.   Vitamin D 50 MCG (2000 UT) tablet Take 2,000 Units by mouth daily.   vitamin E 180 MG (400 UNITS) capsule Take 400 Units by mouth daily.        Discharge Exam: Filed Weights   10/21/21 2213 10/22/21 1700 10/23/21 0511  Weight: 64.1 kg 63.8 kg 63.3 kg   General; NAD  Condition at discharge: stable  The results of significant diagnostics from this hospitalization (including imaging, microbiology, ancillary and laboratory) are listed below for reference.   Imaging Studies: CT MAXILLOFACIAL WO CONTRAST  Result Date: 10/26/2021 CLINICAL DATA:  Trauma, fall EXAM: CT MAXILLOFACIAL  WITHOUT CONTRAST TECHNIQUE: Multidetector CT imaging of the maxillofacial structures was performed. Multiplanar CT image reconstructions were also generated. RADIATION DOSE REDUCTION: This exam was performed according to the departmental dose-optimization program which includes automated exposure control, adjustment of the mA and/or kV according to patient size and/or use of iterative reconstruction technique. COMPARISON:  None Available. FINDINGS: Osseous: No displaced fractures are seen. Orbits: There is no blowout fracture in orbits. Optic globes are symmetrical. Retrobulbar soft tissues are unremarkable. Sinuses: There are no air-fluid levels. There is mild mucosal thickening in the right ethmoid sinus. Soft tissues: There is subcutaneous contusion/hematoma in the left periorbital region and left frontal scalp. No opaque foreign bodies are seen. Limited intracranial: Unremarkable. IMPRESSION: No recent fracture is seen in facial bones. Optic globes are symmetrical. Retrobulbar soft tissues are unremarkable. There are no air-fluid levels in the paranasal sinuses. Mild chronic right ethmoid sinusitis. Electronically Signed   By: Elmer Picker M.D.   On: 10/26/2021 17:07   CT Cervical Spine Wo Contrast  Result Date: 10/26/2021 CLINICAL DATA:  Trauma, fall EXAM: CT CERVICAL SPINE WITHOUT CONTRAST TECHNIQUE: Multidetector CT imaging of the cervical spine was performed without intravenous contrast. Multiplanar CT image reconstructions were also generated. RADIATION DOSE REDUCTION: This exam was performed according to the departmental dose-optimization program which includes automated exposure control, adjustment of the mA and/or kV according to patient size and/or use of iterative reconstruction technique. COMPARISON:  None Available. FINDINGS: Alignment: There is straightening of lordosis. Skull base and vertebrae: No recent fracture is seen. Degenerative changes are noted, more severe at C5-C6 and C6-C7  levels. Soft tissues and spinal canal: There is extrinsic pressure over the ventral margin of thecal sac caused by posterior bony spurs at C5-C6 and C6-C7 levels causing mild-to-moderate spinal stenosis. Disc levels: There is encroachment of neural foramina from C2 to C7 levels, more severe at C5-C6 and C6-C7 levels. Upper chest: There is pleural thickening in both apices. There is ectasia of bronchi in the apices. Other: Thyroid is smaller than usual in size. IMPRESSION: No recent fracture is seen in cervical spine. Cervical spondylosis with encroachment of neural foramina by bony spurs at multiple levels, most severe at C5-C6 and C6-C7 levels. Spinal stenosis is seen at multiple levels, more so at C5-C6 level. Electronically Signed   By: Elmer Picker M.D.   On: 10/26/2021 17:03   CT Head Wo Contrast  Result Date: 10/26/2021 CLINICAL DATA:  Trauma, fall EXAM: CT HEAD WITHOUT CONTRAST TECHNIQUE: Contiguous axial images were obtained from the base of the skull through the vertex  without intravenous contrast. RADIATION DOSE REDUCTION: This exam was performed according to the departmental dose-optimization program which includes automated exposure control, adjustment of the mA and/or kV according to patient size and/or use of iterative reconstruction technique. COMPARISON:  None Available. FINDINGS: Brain: No acute intracranial findings are seen. There are no signs of bleeding within the cranium. Cortical sulci are prominent. There is decreased density in periventricular white matter. Vascular: Unremarkable. Skull: No fracture is seen in the calvarium. There is subcutaneous hematoma in the left periorbital region and left frontal scalp. Sinuses/Orbits: There are no air-fluid levels in paranasal sinuses. Optic globes appear symmetrical. Other: None. IMPRESSION: No acute intracranial findings are seen. Atrophy. Small vessel disease. No fracture is seen in calvarium. There is subcutaneous hematoma in the left  periorbital region and left frontal scalp. Electronically Signed   By: Elmer Picker M.D.   On: 10/26/2021 16:58   CARDIAC CATHETERIZATION  Result Date: 10/24/2021   1st Diag lesion is 50% stenosed.   Prox LAD to Mid LAD lesion is 95% stenosed.   Mid LAD to Dist LAD lesion is 40% stenosed.   A drug-eluting stent was successfully placed using a SYNERGY XD 2.50X28.   Balloon angioplasty was performed using a BALLN SAPPHIRE 2.0X12.   Post intervention, there is a 0% residual stenosis.   Post intervention, there is a 0% residual stenosis.   LV end diastolic pressure is normal. Single vessel obstructive disease with complex bifurcation LAD/first diagonal stenosis Medina class 1,1,1. Normal LVEDP Successful PCI of the LAD/diagonal bifurcation with DES in the LAD and POBA of the diagonal side branch Plan: DAPT for one year. Anticipate DC tomorrow if stable.   ECHOCARDIOGRAM COMPLETE  Result Date: 10/22/2021    ECHOCARDIOGRAM REPORT   Patient Name:   BRIETTA MANSO Date of Exam: 10/22/2021 Medical Rec #:  [id number]            Height:       68.0 in Accession #:    [id number]           Weight:       141.3 lb Date of Birth:  1940-03-13             BSA:          1.763 m Patient Age:    4 years             BP:           140/74 mmHg Patient Gender: F                    HR:           109 bpm. Exam Location:  Inpatient Procedure: 2D Echo, Cardiac Doppler, Color Doppler and Intracardiac            Opacification Agent Indications:    Chest pain  History:        Patient has no prior history of Echocardiogram examinations.                 COPD; Risk Factors:Hypertension.  Sonographer:    Clayton Lefort RDCS (AE) Referring Phys: [place]  1. Septal, apical and inferior apical hypokinesis . Left ventricular ejection fraction, by estimation, is 45 to 50%. The left ventricle has mildly decreased function. The left ventricle has no regional wall motion abnormalities. The left ventricular internal cavity  size was mildly dilated. There is mild left ventricular hypertrophy. Left ventricular diastolic parameters are consistent with  Grade I diastolic dysfunction (impaired relaxation).  2. Right ventricular systolic function is normal. The right ventricular size is normal.  3. The mitral valve is abnormal. Trivial mitral valve regurgitation. No evidence of mitral stenosis. Moderate mitral annular calcification.  4. The aortic valve is tricuspid. There is mild calcification of the aortic valve. There is mild thickening of the aortic valve. Aortic valve regurgitation is mild. Aortic valve sclerosis is present, with no evidence of aortic valve stenosis.  5. The inferior vena cava is normal in size with greater than 50% respiratory variability, suggesting right atrial pressure of 3 mmHg. FINDINGS  Left Ventricle: Septal, apical and inferior apical hypokinesis. Left ventricular ejection fraction, by estimation, is 45 to 50%. The left ventricle has mildly decreased function. The left ventricle has no regional wall motion abnormalities. Definity contrast agent was given IV to delineate the left ventricular endocardial borders. The left ventricular internal cavity size was mildly dilated. There is mild left ventricular hypertrophy. Left ventricular diastolic parameters are consistent with Grade I  diastolic dysfunction (impaired relaxation). Right Ventricle: The right ventricular size is normal. No increase in right ventricular wall thickness. Right ventricular systolic function is normal. Left Atrium: Left atrial size was normal in size. Right Atrium: Right atrial size was normal in size. Pericardium: There is no evidence of pericardial effusion. Mitral Valve: The mitral valve is abnormal. There is mild thickening of the mitral valve leaflet(s). There is mild calcification of the mitral valve leaflet(s). Moderate mitral annular calcification. Trivial mitral valve regurgitation. No evidence of mitral valve stenosis. Tricuspid  Valve: The tricuspid valve is normal in structure. Tricuspid valve regurgitation is not demonstrated. No evidence of tricuspid stenosis. Aortic Valve: The aortic valve is tricuspid. There is mild calcification of the aortic valve. There is mild thickening of the aortic valve. Aortic valve regurgitation is mild. Aortic valve sclerosis is present, with no evidence of aortic valve stenosis. Aortic valve mean gradient measures 2.0 mmHg. Aortic valve peak gradient measures 3.2 mmHg. Aortic valve area, by VTI measures 2.02 cm. Pulmonic Valve: The pulmonic valve was normal in structure. Pulmonic valve regurgitation is trivial. No evidence of pulmonic stenosis. Aorta: The aortic root is normal in size and structure. Venous: The inferior vena cava is normal in size with greater than 50% respiratory variability, suggesting right atrial pressure of 3 mmHg. IAS/Shunts: No atrial level shunt detected by color flow Doppler.  LEFT VENTRICLE PLAX 2D LVIDd:         4.00 cm   Diastology LVIDs:         2.90 cm   LV e' lateral: 5.33 cm/s LV PW:         1.30 cm LV IVS:        1.30 cm LVOT diam:     2.00 cm LV SV:         32 LV SV Index:   18 LVOT Area:     3.14 cm  IVC IVC diam: 0.90 cm LEFT ATRIUM             Index LA diam:        2.70 cm 1.53 cm/m LA Vol (A2C):   39.1 ml 22.17 ml/m LA Vol (A4C):   31.6 ml 17.92 ml/m LA Biplane Vol: 38.9 ml 22.06 ml/m  AORTIC VALVE AV Area (Vmax):    2.05 cm AV Area (Vmean):   2.03 cm AV Area (VTI):     2.02 cm AV Vmax:  89.10 cm/s AV Vmean:          57.000 cm/s AV VTI:            0.157 m AV Peak Grad:      3.2 mmHg AV Mean Grad:      2.0 mmHg LVOT Vmax:         58.20 cm/s LVOT Vmean:        36.900 cm/s LVOT VTI:          0.101 m LVOT/AV VTI ratio: 0.64  AORTA Ao Root diam: 2.90 cm Ao Asc diam:  3.00 cm  SHUNTS Systemic VTI:  0.10 m Systemic Diam: 2.00 cm Jenkins Rouge MD Electronically signed by Jenkins Rouge MD Signature Date/Time: 10/22/2021/10:46:27 AM    Final    DG Chest 2  View  Result Date: 10/21/2021 CLINICAL DATA:  Right chest pain, palpitations EXAM: CHEST - 2 VIEW COMPARISON:  Previous studies including the examination of 05/30/2021 FINDINGS: Cardiac size is within normal limits. There are no signs of pulmonary edema or focal pulmonary consolidation. Linear densities in right parahilar region appears stable suggesting scarring. Small linear density in left lower lung field near the diaphragm has not changed. Small blebs are seen in both apices with no change. IMPRESSION: There are no new infiltrates or signs of pulmonary edema. Electronically Signed   By: Elmer Picker M.D.   On: 10/21/2021 18:35    Microbiology: Results for orders placed or performed during the hospital encounter of 10/21/21  MRSA Next Gen by PCR, Nasal     Status: None   Collection Time: 10/24/21  8:11 PM   Specimen: Nasal Mucosa; Nasal Swab  Result Value Ref Range Status   MRSA by PCR Next Gen NOT DETECTED NOT DETECTED Final    Comment: (NOTE) The GeneXpert MRSA Assay (FDA approved for NASAL specimens only), is one component of a comprehensive MRSA colonization surveillance program. It is not intended to diagnose MRSA infection nor to guide or monitor treatment for MRSA infections. Test performance is not FDA approved in patients less than 59 years old. Performed at [facility], [place] [address] [place], [place] [address]     Labs: CBC: Recent Labs  Lab 10/21/21 1747 10/22/21 0557 10/23/21 0217 10/24/21 0248 10/25/21 0559  WBC 5.1 5.5 5.2 4.7 9.3  HGB 14.2 14.8 13.9 13.3 14.0  HCT 43.1 45.6 42.6 40.5 42.0  MCV 97.7 97.2 95.9 96.0 95.5  PLT 184 189 180 165 003   Basic Metabolic Panel: Recent Labs  Lab 10/21/21 1747 10/22/21 0908 10/23/21 0217 10/24/21 0248 10/25/21 0559  NA 141 143 137 140 137  K 3.3* 3.4* 3.9 4.4 3.6  CL 107 104 108 108 106  CO2 '27 27 23 23 22  '$ GLUCOSE 148* 152* 104* 99 99  BUN 9 7* '19 18 14  '$ CREATININE 0.92 0.77 0.99 0.76 0.72   CALCIUM 9.3 9.3 9.2 9.4 9.4  MG  --  2.1 2.2 2.2  --    Liver Function Tests: No results for input(s): "AST", "ALT", "ALKPHOS", "BILITOT", "PROT", "ALBUMIN" in the last 168 hours. CBG: No results for input(s): "GLUCAP" in the last 168 hours.  Discharge time spent: greater than 30 minutes.  Signed: Elmarie Shiley, MD [facility] 10/26/2021

## 2021-10-26 NOTE — Progress Notes (Signed)
Rounding Note    Patient Name: [name] Date of Encounter: 10/26/2021  [place] Cardiologist: Elouise Munroe, MD   Subjective   No chest pain.  Wants to go home today. No problems with her right hand. No right groin symptoms.   Inpatient Medications    Scheduled Meds:  anastrozole  1 mg Oral Daily   aspirin EC  81 mg Oral Daily   atorvastatin  80 mg Oral Daily   clopidogrel  75 mg Oral Q breakfast   ferrous sulfate  325 mg Oral Q breakfast   fluticasone furoate-vilanterol  1 puff Inhalation Daily   And   umeclidinium bromide  1 puff Inhalation Daily   heparin  5,000 Units Subcutaneous Q8H   latanoprost  1 drop Both Eyes QHS   levothyroxine  75 mcg Oral Q0600   metoprolol succinate  50 mg Oral Daily   multivitamin with minerals  1 tablet Oral Daily   sodium chloride flush  3 mL Intravenous Q12H   venlafaxine XR  37.5 mg Oral Daily   Continuous Infusions:  sodium chloride     cefTRIAXone (ROCEPHIN)  IV 1 g (10/25/21 0851)   PRN Meds: sodium chloride, acetaminophen, acetaminophen, albuterol, ondansetron (ZOFRAN) IV, sodium chloride flush   Vital Signs    Vitals:   10/25/21 2314 10/26/21 0325 10/26/21 0818 10/26/21 0833  BP: (!) 102/55 (!) 105/54  131/88  Pulse: 99 81  87  Resp: '17 19  19  '$ Temp: 98.2 F (36.8 C) (!) 97.4 F (36.3 C)    TempSrc: Oral Oral    SpO2: 94% 92% 95% 96%  Weight:      Height:        Intake/Output Summary (Last 24 hours) at 10/26/2021 0905 Last data filed at 10/25/2021 2158 Gross per 24 hour  Intake 203 ml  Output --  Net 203 ml      10/23/2021    5:11 AM 10/22/2021    5:00 PM 10/21/2021   10:13 PM  Last 3 Weights  Weight (lbs) 139 lb 8.8 oz 140 lb 9.6 oz 141 lb 5 oz  Weight (kg) 63.3 kg 63.776 kg 64.1 kg      Telemetry    NSR - Personally Reviewed  ECG      Physical Exam   GEN: No acute distress.   Neck: No JVD Cardiac: RRR, no murmurs, rubs, or gallops.  Respiratory: Clear to auscultation  bilaterally. GI: Soft, nontender, non-distended  MS: No edema; No deformity. Minimal waveform noted int he right radial during ulnar compression- improved from yesterday.  Neuro:  Nonfocal  Psych: Normal affect   Labs    High Sensitivity Troponin:   Recent Labs  Lab 10/21/21 1747 10/21/21 2054 10/22/21 0609 10/22/21 0908  TROPONINIHS 43* 794* 698* 552*     Chemistry Recent Labs  Lab 10/22/21 0908 10/23/21 0217 10/24/21 0248 10/25/21 0559  NA 143 137 140 137  K 3.4* 3.9 4.4 3.6  CL 104 108 108 106  CO2 '27 23 23 22  '$ GLUCOSE 152* 104* 99 99  BUN 7* '19 18 14  '$ CREATININE 0.77 0.99 0.76 0.72  CALCIUM 9.3 9.2 9.4 9.4  MG 2.1 2.2 2.2  --   GFRNONAA >60 57* >60 >60  ANIONGAP '12 6 9 9    '$ Lipids  Recent Labs  Lab 10/22/21 0609  CHOL 196  TRIG 299*  HDL 55  LDLCALC 81  CHOLHDL 3.6    Hematology Recent Labs  Lab 10/23/21 0217 10/24/21 0248 10/25/21 0559  WBC 5.2 4.7 9.3  RBC 4.44 4.22 4.40  HGB 13.9 13.3 14.0  HCT 42.6 40.5 42.0  MCV 95.9 96.0 95.5  MCH 31.3 31.5 31.8  MCHC 32.6 32.8 33.3  RDW 13.0 13.2 13.2  PLT 180 165 178   Thyroid  Recent Labs  Lab 10/22/21 0609  TSH 8.344*    BNPNo results for input(s): "BNP", "PROBNP" in the last 168 hours.  DDimer  Recent Labs  Lab 10/21/21 1807  DDIMER 0.45     Radiology    CARDIAC CATHETERIZATION  Result Date: 10/24/2021   1st Diag lesion is 50% stenosed.   Prox LAD to Mid LAD lesion is 95% stenosed.   Mid LAD to Dist LAD lesion is 40% stenosed.   A drug-eluting stent was successfully placed using a SYNERGY XD 2.50X28.   Balloon angioplasty was performed using a BALLN SAPPHIRE 2.0X12.   Post intervention, there is a 0% residual stenosis.   Post intervention, there is a 0% residual stenosis.   LV end diastolic pressure is normal. Single vessel obstructive disease with complex bifurcation LAD/first diagonal stenosis Medina class 1,1,1. Normal LVEDP Successful PCI of the LAD/diagonal bifurcation with DES in the  LAD and POBA of the diagonal side branch Plan: DAPT for one year. Anticipate DC tomorrow if stable.    Cardiac Studies   Cath films seen previously  Patient Profile     81 y.o. female with NSTEMI  Assessment & Plan    CAD/NSTEMI: Continue DAPT. Aggressive secondary prevention.   BP controlled. CKD III limits med choices.   Hyperlipidemia: in research study.        For questions or updates, please contact [place] Please consult [email or web address] for contact info under        Signed, Larae Grooms, MD  10/26/2021, 9:05 AM

## 2021-10-26 NOTE — ED Provider Notes (Signed)
[facility] Provider Note   CSN: [id number] Arrival date & time: 10/26/21  1603     History  Chief Complaint  Patient presents with   Lytle Michaels    [name] is a 81 y.o. female.  81 year old female with a history of CAD status post recent PCI presents emergency department with head trauma after a fall.  Patient states that she was going to her sister's house and tripped over a rug.  Says that she landed on her right elbow and struck the left side of her face.  So that she has had a mild headache since then.  Denies any LOC.  Is on aspirin and Plavix but no other blood thinners.  Says that she has had mild bruising and swelling to her left elbow.  No vision changes.  No new numbness or weakness of her arms or legs.  Denies any other injuries.   Fall       Home Medications Prior to Admission medications   Medication Sig Start Date End Date Taking? Authorizing Provider  albuterol (VENTOLIN HFA) 108 (90 Base) MCG/ACT inhaler Inhale 2 puffs into the lungs every 6 (six) hours as needed for wheezing or shortness of breath. 11/27/18   Deneise Lever, MD  anastrozole (ARIMIDEX) 1 MG tablet Take 1 tablet (1 mg total) by mouth daily. 02/03/21   Nicholas Lose, MD  aspirin EC 81 MG tablet Take 1 tablet (81 mg total) by mouth daily. Swallow whole. 10/26/21   Regalado, Belkys A, MD  atorvastatin (LIPITOR) 80 MG tablet Take 1 tablet (80 mg total) by mouth daily. 10/26/21   Regalado, Belkys A, MD  azelastine (ASTELIN) 0.1 % nasal spray 1-2 puffs each nostril twice daily if needed Patient taking differently: Place 1-2 sprays into both nostrils daily as needed for rhinitis. 11/26/17   Deneise Lever, MD  benzonatate (TESSALON) 200 MG capsule Take 1 capsule (200 mg total) by mouth 3 (three) times daily as needed for cough. 05/27/20   Deneise Lever, MD  cephALEXin (KEFLEX) 500 MG capsule Take 1 capsule (500 mg total) by mouth 4 (four) times daily for 1 day. 10/26/21  10/27/21  Regalado, Jerald Kief A, MD  Cholecalciferol (VITAMIN D) 50 MCG (2000 UT) tablet Take 2,000 Units by mouth daily.    [provider]  clopidogrel (PLAVIX) 75 MG tablet Take 1 tablet (75 mg total) by mouth daily with breakfast. 10/26/21   Regalado, Belkys A, MD  Coenzyme Q10 (COQ-10) 100 MG CAPS Take 100 mg by mouth daily.    [provider]  EUTHYROX 75 MCG tablet Take 75 mcg by mouth every morning. 10/11/21   [provider]  ferrous sulfate 325 (65 FE) MG tablet Take 325 mg by mouth daily with breakfast.    [provider]  fluticasone (FLONASE) 50 MCG/ACT nasal spray Place 2 sprays into both nostrils daily. Patient taking differently: Place 2 sprays into both nostrils daily as needed for allergies. 07/29/13   Campbell Riches, MD  Fluticasone-Umeclidin-Vilant (TRELEGY ELLIPTA) 100-62.5-25 MCG/ACT AEPB Inhale 1 puff into the lungs daily. Patient taking differently: Inhale 1 puff into the lungs daily as needed (sob/wheezing). 02/16/21   Deneise Lever, MD  folic acid (FOLVITE) 390 MCG tablet Take 400 mcg by mouth daily.    [provider]  latanoprost (XALATAN) 0.005 % ophthalmic solution Place 1 drop into both eyes at bedtime.    [provider]  metoprolol succinate (TOPROL-XL) 50 MG 24 hr tablet  Take 1 tablet (50 mg total) by mouth daily. Take with or immediately following a meal. 10/26/21   Regalado, Belkys A, MD  Multiple Minerals (CALCIUM/MAGNESIUM/ZINC) TABS Take 3 tablets by mouth at bedtime.    [provider]  Multiple Vitamin (MULTIVITAMIN) tablet Take 1 tablet by mouth daily.    [provider]  NONFORMULARY OR COMPOUNDED ITEM [facility] - Antifungal topical - Terbinafine 3%, Fluconazole 2%, Tea Tree Oil 5%, Urea 10%, Ibuprofen 2%, + Itraconazole 3%, in #261m DMSO suspension. Apply to affected toenail(s) once at bedtime or twice daily. Patient taking differently: Apply 1 application  topically daily as  needed (Toe fungus). [facility]- Antifungal topical - Terbinafine 3%, Fluconazole 2%, Tea Tree Oil 5%, Urea 10%, Ibuprofen 2%, + Itraconazole 3%, in #3761mDMSO suspension. Apply to affected toenail(s) once at bedtime or twice daily. 10/28/18   WaTrula SladeDPM  pyridOXINE (VITAMIN B6) 100 MG tablet Take 100 mg by mouth daily.    [provider]  venlafaxine XR (EFFEXOR-XR) 37.5 MG 24 hr capsule Take 37.5 mg by mouth daily. 09/28/21   [provider]  vitamin C (ASCORBIC ACID) 500 MG tablet Take 500 mg by mouth daily.    [provider]  vitamin E 400 UNIT capsule Take 400 Units by mouth daily.    [provider]      Allergies    Augmentin [amoxicillin-pot clavulanate], Black cohosh, Codeine, Hyoscyamine, Oxybutynin chloride, and Minocycline    Review of Systems   Review of Systems  Physical Exam Updated Vital Signs BP 117/65   Pulse 72   Temp 97.8 F (36.6 C) (Oral)   Resp 19   SpO2 96%  Physical Exam Vitals and nursing note reviewed.  Constitutional:      General: She is not in acute distress.    Appearance: She is well-developed.  HENT:     Head: Normocephalic.     Comments: Hematoma above left eyebrow    Right Ear: External ear normal.     Left Ear: External ear normal.     Nose: Nose normal.  Eyes:     Extraocular Movements: Extraocular movements intact.     Conjunctiva/sclera: Conjunctivae normal.     Pupils: Pupils are equal, round, and reactive to light.     Comments: Pupils 61m561mL  Neck:     Comments: No cervical midline TTP Cardiovascular:     Heart sounds: Normal heart sounds. No murmur heard. Pulmonary:     Effort: Pulmonary effort is normal. No respiratory distress.     Breath sounds: Normal breath sounds.  Abdominal:     General: Abdomen is flat. There is no distension.     Palpations: Abdomen is soft. There is no mass.     Tenderness: There is no abdominal tenderness. There is no guarding.   Musculoskeletal:        General: No swelling. Normal range of motion.     Cervical back: Normal range of motion and neck supple.     Right lower leg: No edema.     Left lower leg: No edema.     Comments: Hematoma to lateral aspect of R elbow.  No snuffbox tenderness of either wrist.  No tenderness palpation of either shoulder.  Skin:    General: Skin is warm and dry.     Capillary Refill: Capillary refill takes less than 2 seconds.  Neurological:     Mental Status: She is alert and oriented to person, place,  and time. Mental status is at baseline.  Psychiatric:        Mood and Affect: Mood normal.     ED Results / Procedures / Treatments   Labs (all labs ordered are listed, but only abnormal results are displayed) Labs Reviewed  BASIC METABOLIC PANEL - Abnormal; Notable for the following components:      Result Value   BUN 25 (*)    All other components within normal limits  CBC  PROTIME-INR  APTT    EKG None  Radiology DG Elbow 2 Views Right  Result Date: 10/26/2021 CLINICAL DATA:  R elbow trauma EXAM: RIGHT ELBOW - 2 VIEW COMPARISON:  None Available. FINDINGS: There is no evidence of fracture, dislocation, or joint effusion. There is no evidence of arthropathy or other focal bone abnormality. Soft tissues are unremarkable. IMPRESSION: Negative. Electronically Signed   By: Iven Finn M.D.   On: 10/26/2021 18:20   CT MAXILLOFACIAL WO CONTRAST  Result Date: 10/26/2021 CLINICAL DATA:  Trauma, fall EXAM: CT MAXILLOFACIAL WITHOUT CONTRAST TECHNIQUE: Multidetector CT imaging of the maxillofacial structures was performed. Multiplanar CT image reconstructions were also generated. RADIATION DOSE REDUCTION: This exam was performed according to the departmental dose-optimization program which includes automated exposure control, adjustment of the mA and/or kV according to patient size and/or use of iterative reconstruction technique. COMPARISON:  None Available. FINDINGS: Osseous:  No displaced fractures are seen. Orbits: There is no blowout fracture in orbits. Optic globes are symmetrical. Retrobulbar soft tissues are unremarkable. Sinuses: There are no air-fluid levels. There is mild mucosal thickening in the right ethmoid sinus. Soft tissues: There is subcutaneous contusion/hematoma in the left periorbital region and left frontal scalp. No opaque foreign bodies are seen. Limited intracranial: Unremarkable. IMPRESSION: No recent fracture is seen in facial bones. Optic globes are symmetrical. Retrobulbar soft tissues are unremarkable. There are no air-fluid levels in the paranasal sinuses. Mild chronic right ethmoid sinusitis. Electronically Signed   By: Elmer Picker M.D.   On: 10/26/2021 17:07   CT Cervical Spine Wo Contrast  Result Date: 10/26/2021 CLINICAL DATA:  Trauma, fall EXAM: CT CERVICAL SPINE WITHOUT CONTRAST TECHNIQUE: Multidetector CT imaging of the cervical spine was performed without intravenous contrast. Multiplanar CT image reconstructions were also generated. RADIATION DOSE REDUCTION: This exam was performed according to the departmental dose-optimization program which includes automated exposure control, adjustment of the mA and/or kV according to patient size and/or use of iterative reconstruction technique. COMPARISON:  None Available. FINDINGS: Alignment: There is straightening of lordosis. Skull base and vertebrae: No recent fracture is seen. Degenerative changes are noted, more severe at C5-C6 and C6-C7 levels. Soft tissues and spinal canal: There is extrinsic pressure over the ventral margin of thecal sac caused by posterior bony spurs at C5-C6 and C6-C7 levels causing mild-to-moderate spinal stenosis. Disc levels: There is encroachment of neural foramina from C2 to C7 levels, more severe at C5-C6 and C6-C7 levels. Upper chest: There is pleural thickening in both apices. There is ectasia of bronchi in the apices. Other: Thyroid is smaller than usual in size.  IMPRESSION: No recent fracture is seen in cervical spine. Cervical spondylosis with encroachment of neural foramina by bony spurs at multiple levels, most severe at C5-C6 and C6-C7 levels. Spinal stenosis is seen at multiple levels, more so at C5-C6 level. Electronically Signed   By: Elmer Picker M.D.   On: 10/26/2021 17:03   CT Head Wo Contrast  Result Date: 10/26/2021 CLINICAL DATA:  Trauma, fall EXAM: CT  HEAD WITHOUT CONTRAST TECHNIQUE: Contiguous axial images were obtained from the base of the skull through the vertex without intravenous contrast. RADIATION DOSE REDUCTION: This exam was performed according to the departmental dose-optimization program which includes automated exposure control, adjustment of the mA and/or kV according to patient size and/or use of iterative reconstruction technique. COMPARISON:  None Available. FINDINGS: Brain: No acute intracranial findings are seen. There are no signs of bleeding within the cranium. Cortical sulci are prominent. There is decreased density in periventricular white matter. Vascular: Unremarkable. Skull: No fracture is seen in the calvarium. There is subcutaneous hematoma in the left periorbital region and left frontal scalp. Sinuses/Orbits: There are no air-fluid levels in paranasal sinuses. Optic globes appear symmetrical. Other: None. IMPRESSION: No acute intracranial findings are seen. Atrophy. Small vessel disease. No fracture is seen in calvarium. There is subcutaneous hematoma in the left periorbital region and left frontal scalp. Electronically Signed   By: Elmer Picker M.D.   On: 10/26/2021 16:58    Procedures Procedures   Medications Ordered in ED Medications - No data to display  ED Course/ Medical Decision Making/ A&P Clinical Course as of 10/27/21 2254  Thu Oct 26, 2021  1815 CT head reviewed and interpreted by me as showing no evidence of ICH.  X-ray reviewed and interpreted by me as showing no acute fracture. [RP]     Clinical Course User Index [RP] Fransico Meadow, MD                           Medical Decision Making Amount and/or Complexity of Data Reviewed Labs: ordered. Radiology: ordered.   RIDLEY DILEO is a 81 year old female with a history of CAD status post recent PCI presents emergency department with head trauma after a fall.   Initial Ddx:  TBI, C-spine injury, orbital wall fracture, elbow fracture  MDM:  Given the patient's dual antiplatelet therapy and fall with hematoma to the front of her face was concerned about possible TBI.  C-spine injury also on the differential given the patient's age.  Considered orbital wall fracture given the amount of swelling around her eye but patient has intact EOM and is not complaining of vision changes.  Also considering proximal radius/distal humerus fracture given the patient's elbow bruising.  Plan:  CT head CT C-spine Elbow x-ray Labs  ED Summary:  Patient underwent the above work-up that did not show evidence of fracture or ICH.  Patient did not develop any symptoms concerning for concussion while in the emergency department.  We will have the patient follow-up with her primary doctor regarding her symptoms.  Dispo: DC Home. Return precautions discussed including, but not limited to, those listed in the AVS. Allowed pt time to ask questions which were answered fully prior to dc.   Additional history obtained from  friend Records reviewed Admission Notes and DC Summary I independently visualized the following imaging with scope of interpretation limited to determining acute life threatening conditions related to emergency care: Extremity x-ray(s) and CT Head, which revealed no ICH or elbow fracture  Final diagnoses:  Contusion of face, initial encounter  Contusion of right elbow, initial encounter    Rx / DC Orders ED Discharge Orders     None         Fransico Meadow, MD 10/27/21 2254

## 2021-10-30 ENCOUNTER — Telehealth: Payer: Self-pay | Admitting: Internal Medicine

## 2021-10-30 NOTE — Progress Notes (Signed)
Cardiology Office Note:    Date:  11/02/2021   ID:  [name], DOB February 17, 1941, MRN [id number]  PCP:  Jonathon Jordan, MD  Cardiologist:  Elouise Munroe, MD  Electrophysiologist:  None   Referring MD: Jonathon Jordan, MD   Chief Complaint: hospital follow-up for NSTEMI  History of Present Illness:    [name] is a 81 y.o. female with a history of with a history of CAD with recent NSTEMI s/p DES to LAD and POBA of the 1st Diag on 10/24/2021, ischemic cardiomyopathy with EF of 45-50%, LBBB, hypertension, hyperlipidemia, hypothyroidism, COPD, breast cancer, and depression who is followed by Dr. Margaretann Loveless and presents today for hospital follow-up after NSTEMI.  Patient was first seen by Cardiology during recent hospitalization. Patient was admitted from 10/21/2021 to 10/26/2021 for NSTEMI after presenting with chest pain and palpitations. High-sensitivity troponin peaked at 794. Echo showed LVEF of 45-50% with septal, apical, and inferior apical hypokinesis and grade 1 diastolic dysfunction as well as moderate MAC and aortic valve sclerosis without evidence of AS. LHC on 10/24/2021 showed 95% stenosis of proximal to mid LAD, 40% stenosis of mid to distal LAD, and 50% of 1st Diag. Patient underwent successful PCI of the LAD/Diagonal bifurcation with DES to the LAD and POBA of the 1st Diag side branch. She was started on DAPT with Aspirin and Plavix. She was also diagnosed with UTI and treated with antibiotics.   She was discharged from the hospital on 9/7. Upon returning home, patient tripped over her door frame/welcome mat and she hit her head. She went back to the ED on 9/7, CT head showed no acute intracranial findings. There were no signs of cervical fracture or ICH and she was discharged that day. She reports that her fall was mechanical, and she tripped over her doorway. She denies having any dizziness, syncope, or near syncope. Yesterday, she became mildly confused and her family  asked her PCP if she could have a second CT head. CT head is scheduled for this afternoon.   Patient presents today for follow up following her NSTEMI with DES to the LAD and POBA of the 1st Diag side branch. Since being discharged from the hospital, she has not had any further episodes of chest pain. Denies SOB, orthopnea, ankle edema, excessive fatigue, dizziness, syncope/near syncope, cough. She is able to go from sitting to standing without experiencing dizziness or lightheadedness. She initially had some mild bruising around her right radial cath site, but this has resolved.  Right radial cath site is nontender. She is very active, and she is able to go on walks with her sister, climb stairs, and do her activities of daily living without chest pain or SOB. She denies any bleeding, bloody/dark stools, blood in her urine.    Past Medical History:  Diagnosis Date   Allergy    Anemia    Breast cancer ([place])    Cataract    COPD (chronic obstructive pulmonary disease) ([place])    Depression    Essential hypertension 11/29/2014   Glaucoma    Hemoptysis    Hypothyroidism    Left bundle branch block 03/16/2005   Oxygen deficiency    patient was using oxygen at home, her pulmonologist discontinued it and patient stopped using it on Monday Mar 20, 2015   Personal history of chemotherapy    Personal history of radiation therapy    Restless leg syndrome    Sinusitis    Vertigo  Past Surgical History:  Procedure Laterality Date   ABDOMINAL HYSTERECTOMY     APPENDECTOMY  2009   BREAST CYST ASPIRATION Right 06/12/2016   BREAST LUMPECTOMY WITH RADIOACTIVE SEED AND SENTINEL LYMPH NODE BIOPSY Bilateral 02/04/2020   Procedure: BILATERAL BREAST LUMPECTOMY WITH RADIOACTIVE SEED , RIGHT X 2, LEFT X 1 AND RIGHT SENTINEL LYMPH NODE MAPPING;  Surgeon: Erroll Luna, MD;  Location: [facility];  Service: General;  Laterality: Bilateral;   CORONARY STENT INTERVENTION N/A 10/24/2021   Procedure: CORONARY STENT  INTERVENTION;  Surgeon: Martinique, Peter M, MD;  Location: [facility];  Service: Cardiovascular;  Laterality: N/A;   LEFT HEART CATH AND CORONARY ANGIOGRAPHY N/A 10/24/2021   Procedure: LEFT HEART CATH AND CORONARY ANGIOGRAPHY;  Surgeon: Martinique, Peter M, MD;  Location: [facility];  Service: Cardiovascular;  Laterality: N/A;   RE-EXCISION OF BREAST LUMPECTOMY Bilateral 03/03/2020   Procedure: RE-EXCISION BILATERAL BREAST LUMPECTOMY;  Surgeon: Erroll Luna, MD;  Location: [facility];  Service: General;  Laterality: Bilateral;   VESICOVAGINAL FISTULA CLOSURE W/ TAH  1992    Current Medications: Current Meds  Medication Sig   albuterol (VENTOLIN HFA) 108 (90 Base) MCG/ACT inhaler Inhale 2 puffs into the lungs every 6 (six) hours as needed for wheezing or shortness of breath.   anastrozole (ARIMIDEX) 1 MG tablet Take 1 tablet (1 mg total) by mouth daily.   aspirin EC 81 MG tablet Take 1 tablet (81 mg total) by mouth daily. Swallow whole.   atorvastatin (LIPITOR) 80 MG tablet Take 1 tablet (80 mg total) by mouth daily.   azelastine (ASTELIN) 0.1 % nasal spray 1-2 puffs each nostril twice daily if needed (Patient taking differently: Place 1-2 sprays into both nostrils daily as needed for rhinitis.)   benzonatate (TESSALON) 200 MG capsule Take 1 capsule (200 mg total) by mouth 3 (three) times daily as needed for cough.   Cholecalciferol (VITAMIN D) 50 MCG (2000 UT) tablet Take 2,000 Units by mouth daily.   clopidogrel (PLAVIX) 75 MG tablet Take 1 tablet (75 mg total) by mouth daily with breakfast.   Coenzyme Q10 (COQ-10) 100 MG CAPS Take 100 mg by mouth daily.   EUTHYROX 75 MCG tablet Take 75 mcg by mouth every morning.   ferrous sulfate 325 (65 FE) MG tablet Take 325 mg by mouth daily with breakfast.   fluticasone (FLONASE) 50 MCG/ACT nasal spray Place 2 sprays into both nostrils daily. (Patient taking differently: Place 2 sprays into both nostrils daily as needed for  allergies.)   Fluticasone-Umeclidin-Vilant (TRELEGY ELLIPTA) 100-62.5-25 MCG/ACT AEPB Inhale 1 puff into the lungs daily. (Patient taking differently: Inhale 1 puff into the lungs daily as needed (sob/wheezing).)   folic acid (FOLVITE) 924 MCG tablet Take 400 mcg by mouth daily.   latanoprost (XALATAN) 0.005 % ophthalmic solution Place 1 drop into both eyes at bedtime.   losartan (COZAAR) 25 MG tablet Take 1 tablet (25 mg total) by mouth daily.   metoprolol succinate (TOPROL-XL) 50 MG 24 hr tablet Take 1 tablet (50 mg total) by mouth daily. Take with or immediately following a meal.   Multiple Minerals (CALCIUM/MAGNESIUM/ZINC) TABS Take 3 tablets by mouth at bedtime.   Multiple Vitamin (MULTIVITAMIN) tablet Take 1 tablet by mouth daily.   NONFORMULARY OR COMPOUNDED ITEM [facility] - Antifungal topical - Terbinafine 3%, Fluconazole 2%, Tea Tree Oil 5%, Urea 10%, Ibuprofen 2%, + Itraconazole 3%, in #77m DMSO suspension. Apply to affected toenail(s) once at bedtime or twice  daily. (Patient taking differently: Apply 1 application  topically daily as needed (Toe fungus). [place] [place] - Antifungal topical - Terbinafine 3%, Fluconazole 2%, Tea Tree Oil 5%, Urea 10%, Ibuprofen 2%, + Itraconazole 3%, in #48m DMSO suspension. Apply to affected toenail(s) once at bedtime or twice daily.)   pyridOXINE (VITAMIN B6) 100 MG tablet Take 100 mg by mouth daily.   venlafaxine XR (EFFEXOR-XR) 37.5 MG 24 hr capsule Take 37.5 mg by mouth daily.   vitamin C (ASCORBIC ACID) 500 MG tablet Take 500 mg by mouth daily.   vitamin E 400 UNIT capsule Take 400 Units by mouth daily.     Allergies:   Augmentin [amoxicillin-pot clavulanate], Black cohosh, Codeine, Hyoscyamine, Oxybutynin chloride, and Minocycline   Social History   Socioeconomic History   Marital status: Single    Spouse name: Not on file   Number of children: Not on file   Years of education: Not on file   Highest education level: Not on  file  Occupational History   Occupation: retired    [employer] farm manager [place] Tobacco Use   Smoking status: Never   Smokeless tobacco: Never  Vaping Use   Vaping Use: Never used  Substance and Sexual Activity   Alcohol use: No    Alcohol/week: 0.0 standard drinks of alcohol   Drug use: No   Sexual activity: Not on file  Other Topics Concern   Not on file  Social History Narrative   Not on file   Social Determinants of Health   Financial Resource Strain: Not on file  Food Insecurity: No Food Insecurity (10/25/2021)   Hunger Vital Sign    Worried About Running Out of Food in the Last Year: Never true    Ran Out of Food in the Last Year: Never true  Transportation Needs: No Transportation Needs (10/25/2021)   PRAPARE - THydrologist(Medical): No    Lack of Transportation (Non-Medical): No  Physical Activity: Not on file  Stress: Not on file  Social Connections: Not on file     Family History: The patient's family history includes Breast cancer in her sister; COPD in her mother; Colon polyps in her mother; Emphysema in her father and mother; Heart disease in her father and mother. There is no history of Colon cancer, Esophageal cancer, Stomach cancer, or Rectal cancer.  ROS:   Please see the history of present illness.     EKGs/Labs/Other Studies Reviewed:    The following studies were reviewed:  Echocardiogram 10/22/2021: Impressions: 1. Septal, apical and inferior apical hypokinesis . Left ventricular  ejection fraction, by estimation, is 45 to 50%. The left ventricle has  mildly decreased function. The left ventricle has no regional wall motion  abnormalities. The left ventricular  internal cavity size was mildly dilated. There is mild left ventricular  hypertrophy. Left ventricular diastolic parameters are consistent with  Grade I diastolic dysfunction (impaired relaxation).   2. Right ventricular systolic function is normal. The right  ventricular  size is normal.   3. The mitral valve is abnormal. Trivial mitral valve regurgitation. No  evidence of mitral stenosis. Moderate mitral annular calcification.   4. The aortic valve is tricuspid. There is mild calcification of the  aortic valve. There is mild thickening of the aortic valve. Aortic valve  regurgitation is mild. Aortic valve sclerosis is present, with no evidence  of aortic valve stenosis.   5. The inferior vena cava is normal in size with greater  than 50%  respiratory variability, suggesting right atrial pressure of 3 mmHg.  _______________  Left Cardiac Catheterization 10/24/2021:   1st Diag lesion is 50% stenosed.   Prox LAD to Mid LAD lesion is 95% stenosed.   Mid LAD to Dist LAD lesion is 40% stenosed.   A drug-eluting stent was successfully placed using a SYNERGY XD 2.50X28.   Balloon angioplasty was performed using a BALLN SAPPHIRE 2.0X12.   Post intervention, there is a 0% residual stenosis.   Post intervention, there is a 0% residual stenosis.   LV end diastolic pressure is normal.   Single vessel obstructive disease with complex bifurcation LAD/first diagonal stenosis [place] class 1,1,1.  Normal LVEDP Successful PCI of the LAD/diagonal bifurcation with DES in the LAD and POBA of the diagonal side branch   Plan: DAPT for one year. Anticipate DC tomorrow if stable.   Diagnostic Dominance: Right  Intervention      EKG:  EKG  ordered today. EKG personally reviewed and demonstrates Sinus rhythm with a LBBB present (known) .  Recent Labs: 10/22/2021: TSH 8.344 10/24/2021: Magnesium 2.2 10/26/2021: BUN 25; Creatinine, Ser 0.90; Hemoglobin 13.1; Platelets 159; Potassium 4.0; Sodium 136  Recent Lipid Panel    Component Value Date/Time   CHOL 196 10/22/2021 0609   TRIG 299 (H) 10/22/2021 0609   HDL 55 10/22/2021 0609   CHOLHDL 3.6 10/22/2021 0609   VLDL 60 (H) 10/22/2021 0609   LDLCALC 81 10/22/2021 0609    Physical Exam:    Vital Signs: BP  (!) 114/56 (BP Location: Right Arm, Patient Position: Sitting, Cuff Size: Normal)   Pulse 68   Ht _0  (1.702 m)   Wt 140 lb (63.5 kg)   BMI 21.93 kg/m     Wt Readings from Last 3 Encounters:  11/02/21 140 lb (63.5 kg)  10/23/21 139 lb 8.8 oz (63.3 kg)  05/30/21 140 lb 6.4 oz (63.7 kg)     General: 81 y.o. female in no acute distress. Sitting comfortably on the table  HEENT: Normocephalic. Bruises surrounding left eye. Sclera clear. EOMs intact. Neck: Supple.  No JVD. Heart: RRR. Distinct S1 and S2. No murmurs, gallops, or rubs. Radial pulses 2+ and equal bilaterally. Right radial cath site soft, nontender, without bleeding or bruising.  Lungs: No increased work of breathing. Clear to ausculation bilaterally. No wheezes, rhonchi, or rales.  Abdomen: Soft, non-distended, and non-tender to palpation. MSK: Normal strength and tone for age.  Extremities: No lower extremity edema. Some varicose veins present    Skin: Warm and dry. Neuro: Alert and oriented x3. No focal deficits. Psych: Normal affect. Responds appropriately.   Assessment:    1. NSTEMI (non-ST elevated myocardial infarction) ([place])   2. Essential hypertension   3. Status post coronary artery stent placement   4. Mixed hyperlipidemia     Plan:    CAD with Recent NSTEMI Ischemic Cardiomyopathy Patient was recently admitted with NSTEMI s/p DES to LAD and POBA to 1st Diag. LVEF of 45-50% with septal, apical, and inferior apical hypokinesis and grade 1 diastolic dysfunction as well as moderate MAC and aortic valve sclerosis without evidence of AS. - No recurrent chest pain. No DOE, orthopnea, ankle edema, cough.  - Continue DAPT with Aspirin and Plavix. - Continue Toprol-XL 81m daily.  - Given mild ischemic cardiomyopathy, start losartan 25 mg daily  - Continue Lipitor 855mdaily.  Hypertension BP 114/56 this visit - Continue toprol-xl 50 mg daily  - Start losartan 25 mg daily  Hyperlipidemia Lipid panel on  10/22/2021 during recent admission: Total Cholesterol 196, Triglycerides 299, HDL 55, LDL 81. Lipoprotein A 23.5. LDL goal <70 given CAD. - Started on Lipitor 93m daily during recent admission. Continue. - Also on [employer]. - Will repeat lipid panel and LFTs in 6-8 weeks.   Recent mechanical fall Patient had a mechanical fall while walking in her front door after discharge from hospital on 9/7. Reportedly hit her head on the linoleum floor.  - Denies any dizziness, syncope, or near syncope. Believes she tripped over her welcome mat - CT head on 9/7 negative for ICH  - Patient recently started DAPT after having stent placed. Family requested a repeat head CT to confirm that there is no brain bleed. PCP ordered study and it is arranged for this afternoon.  Disposition: Follow up in 3 months    Medication Adjustments/Labs and Tests Ordered: Current medicines are reviewed at length with the patient today.  Concerns regarding medicines are outlined above.  Orders Placed This Encounter  Procedures   Amb Referral to Cardiac Rehabilitation   EKG 12-Lead   Meds ordered this encounter  Medications   losartan (COZAAR) 25 MG tablet    Sig: Take 1 tablet (25 mg total) by mouth daily.    Dispense:  90 tablet    Refill:  3    Patient Instructions  Medication Instructions:  Your physician has recommended you make the following change in your medication:  START: Losartan 273mdaily  *If you need a refill on your cardiac medications before your next appointment, please call your pharmacy*   Lab Work: NONE ordered at this time of appointment   If you have labs (blood work) drawn today and your tests are completely normal, you will receive your results only by: MyBudeif you have MyChart) OR A paper copy in the mail If you have any lab test that is abnormal or we need to change your treatment, we will call you to review the results.   Testing/Procedures: NONE ordered at this time  of appointment   Follow-Up: [facility] are our priority.  As part of our continuing mission to provide you with exceptional heart care, we have created designated Provider Care Teams.  These Care Teams include your primary Cardiologist (physician) and Advanced Practice Providers (APPs -  Physician Assistants and Nurse Practitioners) who all work together to provide you with the care you need, when you need it.  We recommend signing up for the patient portal called "MyChart".  Sign up information is provided on this After Visit Summary.  MyChart is used to connect with patients for Virtual Visits (Telemedicine).  Patients are able to view lab/test results, encounter notes, upcoming appointments, etc.  Non-urgent messages can be sent to your provider as well.   To learn more about what you can do with MyChart, go to [email or web address]   Your next appointment:   3 month(s)  The format for your next appointment:   In Person  Provider:   GaElouise MunroeMD Or CaSande RivesPAHudsont DrDel [facility]o [facility]West[address]  [id number] Driving Directions Main: [phone] Signed, KaMargie BilletPA-C  11/02/2021 4:37 PM    [place]

## 2021-10-30 NOTE — Telephone Encounter (Signed)
Pt c/o medication issue:  1. Name of Medication: atorvastatin (LIPITOR) 80 MG tablet; clopidogrel (PLAVIX) 75 MG tablet; metoprolol succinate (TOPROL-XL) 50 MG 24 hr tablet  2. How are you currently taking this medication (dosage and times per day)? As written  3. Are you having a reaction (difficulty breathing--STAT)? No   4. What is your medication issue? Patient's sister said that one of those medication's are giving the patient very bad diarrhea.

## 2021-10-30 NOTE — Telephone Encounter (Signed)
Probably her metoprolol. Pt is s/p NSTEMI w/ PCI. Could consider swapping beta blockers. Will defer to Dr. Irish Lack

## 2021-10-31 NOTE — Telephone Encounter (Signed)
Attempted to call patient, left message for patient to call back to office.   

## 2021-11-02 ENCOUNTER — Other Ambulatory Visit (HOSPITAL_BASED_OUTPATIENT_CLINIC_OR_DEPARTMENT_OTHER): Payer: Self-pay | Admitting: Family Medicine

## 2021-11-02 ENCOUNTER — Encounter: Payer: Self-pay | Admitting: Student

## 2021-11-02 ENCOUNTER — Ambulatory Visit: Payer: Medicare PPO | Admitting: Cardiology

## 2021-11-02 ENCOUNTER — Ambulatory Visit
Admission: RE | Admit: 2021-11-02 | Discharge: 2021-11-02 | Disposition: A | Discharge status: Home / Self Care | Payer: Medicare PPO | Source: Ambulatory Visit | Attending: Family Medicine | Admitting: Family Medicine

## 2021-11-02 ENCOUNTER — Encounter (HOSPITAL_BASED_OUTPATIENT_CLINIC_OR_DEPARTMENT_OTHER): Payer: Self-pay

## 2021-11-02 VITALS — BP 114/56 | HR 68 | Ht 67.0 in | Wt 140.0 lb

## 2021-11-02 DIAGNOSIS — J22 Unspecified acute lower respiratory infection: Secondary | ICD-10-CM | POA: Diagnosis not present

## 2021-11-02 DIAGNOSIS — N390 Urinary tract infection, site not specified: Secondary | ICD-10-CM | POA: Diagnosis not present

## 2021-11-02 DIAGNOSIS — I214 Non-ST elevation (NSTEMI) myocardial infarction: Secondary | ICD-10-CM

## 2021-11-02 DIAGNOSIS — I10 Essential (primary) hypertension: Secondary | ICD-10-CM | POA: Diagnosis not present

## 2021-11-02 DIAGNOSIS — Z03818 Encounter for observation for suspected exposure to other biological agents ruled out: Secondary | ICD-10-CM | POA: Diagnosis not present

## 2021-11-02 DIAGNOSIS — Z955 Presence of coronary angioplasty implant and graft: Secondary | ICD-10-CM

## 2021-11-02 DIAGNOSIS — E782 Mixed hyperlipidemia: Secondary | ICD-10-CM

## 2021-11-02 DIAGNOSIS — W19XXXA Unspecified fall, initial encounter: Secondary | ICD-10-CM | POA: Diagnosis not present

## 2021-11-02 DIAGNOSIS — Z23 Encounter for immunization: Secondary | ICD-10-CM | POA: Diagnosis not present

## 2021-11-02 DIAGNOSIS — S0990XA Unspecified injury of head, initial encounter: Secondary | ICD-10-CM | POA: Insufficient documentation

## 2021-11-02 DIAGNOSIS — E785 Hyperlipidemia, unspecified: Secondary | ICD-10-CM | POA: Insufficient documentation

## 2021-11-02 DIAGNOSIS — Z79899 Other long term (current) drug therapy: Secondary | ICD-10-CM | POA: Diagnosis not present

## 2021-11-02 DIAGNOSIS — S0003XA Contusion of scalp, initial encounter: Secondary | ICD-10-CM | POA: Diagnosis not present

## 2021-11-02 MED ORDER — LOSARTAN POTASSIUM 25 MG PO TABS: 25.0000 mg | ORAL_TABLET | Freq: Every day | ORAL | 3 refills | Status: DC

## 2021-11-02 NOTE — Patient Instructions (Addendum)
Medication Instructions:  Your physician has recommended you make the following change in your medication:  START: Losartan '25mg'$  daily  *If you need a refill on your cardiac medications before your next appointment, please call your pharmacy*   Lab Work: NONE ordered at this time of appointment   If you have labs (blood work) drawn today and your tests are completely normal, you will receive your results only by: [place] (if you have MyChart) OR A paper copy in the mail If you have any lab test that is abnormal or we need to change your treatment, we will call you to review the results.   Testing/Procedures: NONE ordered at this time of appointment   Follow-Up: At [facility], you and your health needs are our priority.  As part of our continuing mission to provide you with exceptional heart care, we have created designated Provider Care Teams.  These Care Teams include your primary Cardiologist (physician) and Advanced Practice Providers (APPs -  Physician Assistants and Nurse Practitioners) who all work together to provide you with the care you need, when you need it.  We recommend signing up for the patient portal called "MyChart".  Sign up information is provided on this After Visit Summary.  MyChart is used to connect with patients for Virtual Visits (Telemedicine).  Patients are able to view lab/test results, encounter notes, upcoming appointments, etc.  Non-urgent messages can be sent to your provider as well.   To learn more about what you can do with MyChart, go to [email or web address].    Your next appointment:   3 month(s)  The format for your next appointment:   In Person  Provider:   Elouise Munroe, MD Or Sande Rives, [place] [facility] [address] [place],  [place]  [id number] Get Driving Directions Main: [phone]

## 2021-11-14 DIAGNOSIS — J449 Chronic obstructive pulmonary disease, unspecified: Secondary | ICD-10-CM | POA: Diagnosis not present

## 2021-11-14 DIAGNOSIS — I2510 Atherosclerotic heart disease of native coronary artery without angina pectoris: Secondary | ICD-10-CM | POA: Diagnosis not present

## 2021-11-14 DIAGNOSIS — I10 Essential (primary) hypertension: Secondary | ICD-10-CM | POA: Diagnosis not present

## 2021-11-14 DIAGNOSIS — F322 Major depressive disorder, single episode, severe without psychotic features: Secondary | ICD-10-CM | POA: Diagnosis not present

## 2021-11-14 DIAGNOSIS — E7800 Pure hypercholesterolemia, unspecified: Secondary | ICD-10-CM | POA: Diagnosis not present

## 2021-11-16 ENCOUNTER — Telehealth (HOSPITAL_COMMUNITY): Payer: Self-pay

## 2021-11-16 NOTE — Telephone Encounter (Signed)
Called and spoke with pt in regards to CR, pt stated she is not interested at this time.   Closed referral 

## 2021-11-27 NOTE — Telephone Encounter (Signed)
Patient seen in office on 11/02/21 by PA-C

## 2021-12-22 ENCOUNTER — Ambulatory Visit
Admission: RE | Admit: 2021-12-22 | Discharge: 2021-12-22 | Disposition: A | Discharge status: Home / Self Care | Payer: Medicare PPO | Source: Ambulatory Visit | Attending: Family Medicine | Admitting: Family Medicine

## 2021-12-22 DIAGNOSIS — Z853 Personal history of malignant neoplasm of breast: Secondary | ICD-10-CM | POA: Diagnosis not present

## 2021-12-22 DIAGNOSIS — R92323 Mammographic fibroglandular density, bilateral breasts: Secondary | ICD-10-CM | POA: Diagnosis not present

## 2022-01-18 NOTE — Progress Notes (Signed)
Cardiology Office Note:    Date:  01/24/2022   ID:  [name], DOB December 19, 1940, MRN [id number]  PCP:  Jonathon Jordan, MD  Cardiologist:  Elouise Munroe, MD  Electrophysiologist:  None   Referring MD: Jonathon Jordan, MD   Chief Complaint: follow-up of CAD  History of Present Illness:    [name] is a 81 y.o. female with a history of with a history of CAD with recent NSTEMI s/p DES to LAD and POBA of the 1st Diag on 10/24/2021, ischemic cardiomyopathy with EF of 45-50%, LBBB, hypertension, hyperlipidemia, hypothyroidism, COPD, breast cancer, and depression who is followed by Dr. Margaretann Loveless and presents today for follow-up of CAD.  Patient was first seen by Cardiology during recent hospitalization. Patient was admitted from 10/21/2021 to 10/26/2021 for NSTEMI after presenting with chest pain and palpitations. High-sensitivity troponin peaked at 794. Echo showed LVEF of 45-50% with septal, apical, and inferior apical hypokinesis and grade 1 diastolic dysfunction as well as moderate MAC and aortic valve sclerosis without evidence of AS. LHC on 10/24/2021 showed 95% stenosis of proximal to mid LAD, 40% stenosis of mid to distal LAD, and 50% of 1st Diag. Patient underwent successful PCI of the LAD/Diagonal bifurcation with DES to the LAD and POBA of the 1st Diag side branch. She was started on DAPT with Aspirin and Plavix. She was also diagnosed with UTI and treated with antibiotics.   Patient was last seen on 11/02/2021 by Vikki Ports, PA-C, at which time she was doing well from a cardiac standpoint with no recurrent chest pain and no shortness of breath. She did report having a mechanical fall after returning home from the hospital and hit her head in the process. She was having some mild confusion after this but head CT showed no acute intracranial abnormalities. She was started on Losartan given mildly elevated BP and mildly reduced EF.   Patient presents today for follow-up.  Here alone. Patient is doing well from a cardiac standpoint. She denies any chest pain. She reports occasional shortness of breath if she is walking quickly but this is not new and is stable. No shortness of breath with routine activities. No orthopnea, PND, edema, palpitations, lightheadedness, dizziness, or syncope. She is compliant with her medications and is tolerating them well. She has questions about about what heart healthy foods she can eat and we discussed this for a while.  Past Medical History:  Diagnosis Date   Allergy    Anemia    Breast cancer ([place])    Cataract    COPD (chronic obstructive pulmonary disease) ([place])    Depression    Essential hypertension 11/29/2014   Glaucoma    Hemoptysis    Hypothyroidism    Left bundle branch block 03/16/2005   Oxygen deficiency    patient was using oxygen at home, her pulmonologist discontinued it and patient stopped using it on Monday Mar 20, 2015   Personal history of chemotherapy    Personal history of radiation therapy    Restless leg syndrome    Sinusitis    Vertigo     Past Surgical History:  Procedure Laterality Date   ABDOMINAL HYSTERECTOMY     APPENDECTOMY  2009   BREAST CYST ASPIRATION Right 06/12/2016   BREAST LUMPECTOMY Right 02/04/2020   BREAST LUMPECTOMY Left 02/04/2020   BREAST LUMPECTOMY WITH RADIOACTIVE SEED AND SENTINEL LYMPH NODE BIOPSY Bilateral 02/04/2020   Procedure: BILATERAL BREAST LUMPECTOMY WITH RADIOACTIVE SEED , RIGHT X 2, LEFT  X 1 AND RIGHT SENTINEL LYMPH NODE MAPPING;  Surgeon: Erroll Luna, MD;  Location: [facility];  Service: General;  Laterality: Bilateral;   CORONARY STENT INTERVENTION N/A 10/24/2021   Procedure: CORONARY STENT INTERVENTION;  Surgeon: Martinique, Peter M, MD;  Location: [facility];  Service: Cardiovascular;  Laterality: N/A;   LEFT HEART CATH AND CORONARY ANGIOGRAPHY N/A 10/24/2021   Procedure: LEFT HEART CATH AND CORONARY ANGIOGRAPHY;  Surgeon: Martinique, Peter M, MD;  Location:  [facility];  Service: Cardiovascular;  Laterality: N/A;   RE-EXCISION OF BREAST LUMPECTOMY Bilateral 03/03/2020   Procedure: RE-EXCISION BILATERAL BREAST LUMPECTOMY;  Surgeon: Erroll Luna, MD;  Location: [facility];  Service: General;  Laterality: Bilateral;   VESICOVAGINAL FISTULA CLOSURE W/ TAH  1992    Current Medications: Current Meds  Medication Sig   albuterol (VENTOLIN HFA) 108 (90 Base) MCG/ACT inhaler Inhale 2 puffs into the lungs every 6 (six) hours as needed for wheezing or shortness of breath.   anastrozole (ARIMIDEX) 1 MG tablet Take 1 tablet (1 mg total) by mouth daily.   aspirin EC 81 MG tablet Take 1 tablet (81 mg total) by mouth daily. Swallow whole.   atorvastatin (LIPITOR) 80 MG tablet Take 1 tablet (80 mg total) by mouth daily.   azelastine (ASTELIN) 0.1 % nasal spray 1-2 puffs each nostril twice daily if needed (Patient taking differently: Place 1-2 sprays into both nostrils daily as needed for rhinitis.)   benzonatate (TESSALON) 200 MG capsule Take 1 capsule (200 mg total) by mouth 3 (three) times daily as needed for cough.   Cholecalciferol (VITAMIN D) 50 MCG (2000 UT) tablet Take 2,000 Units by mouth daily.   clopidogrel (PLAVIX) 75 MG tablet Take 1 tablet (75 mg total) by mouth daily with breakfast.   Coenzyme Q10 (COQ-10) 100 MG CAPS Take 100 mg by mouth daily.   EUTHYROX 75 MCG tablet Take 75 mcg by mouth every morning.   ferrous sulfate 325 (65 FE) MG tablet Take 325 mg by mouth daily with breakfast.   fluticasone (FLONASE) 50 MCG/ACT nasal spray Place 2 sprays into both nostrils daily. (Patient taking differently: Place 2 sprays into both nostrils daily as needed for allergies.)   Fluticasone-Umeclidin-Vilant (TRELEGY ELLIPTA) 100-62.5-25 MCG/ACT AEPB Inhale 1 puff into the lungs daily. (Patient taking differently: Inhale 1 puff into the lungs daily as needed (sob/wheezing).)   folic acid (FOLVITE) 094 MCG tablet Take 400 mcg by mouth  daily.   latanoprost (XALATAN) 0.005 % ophthalmic solution Place 1 drop into both eyes at bedtime.   losartan (COZAAR) 25 MG tablet Take 1 tablet (25 mg total) by mouth daily.   metoprolol succinate (TOPROL-XL) 50 MG 24 hr tablet Take 1 tablet (50 mg total) by mouth daily. Take with or immediately following a meal.   Multiple Minerals (CALCIUM/MAGNESIUM/ZINC) TABS Take 3 tablets by mouth at bedtime.   Multiple Vitamin (MULTIVITAMIN) tablet Take 1 tablet by mouth daily.   nitroGLYCERIN (NITROSTAT) 0.4 MG SL tablet Place 1 tablet (0.4 mg total) under the tongue every 5 (five) minutes as needed for chest pain.   NONFORMULARY OR COMPOUNDED ITEM [facility] - Antifungal topical - Terbinafine 3%, Fluconazole 2%, Tea Tree Oil 5%, Urea 10%, Ibuprofen 2%, + Itraconazole 3%, in #5m DMSO suspension. Apply to affected toenail(s) once at bedtime or twice daily. (Patient taking differently: Apply 1 application  topically daily as needed (Toe fungus). [facility]- Antifungal topical - Terbinafine 3%, Fluconazole 2%, Tea Tree Oil  5%, Urea 10%, Ibuprofen 2%, + Itraconazole 3%, in #87m DMSO suspension. Apply to affected toenail(s) once at bedtime or twice daily.)   pyridOXINE (VITAMIN B6) 100 MG tablet Take 100 mg by mouth daily.   venlafaxine XR (EFFEXOR-XR) 37.5 MG 24 hr capsule Take 37.5 mg by mouth daily.   vitamin C (ASCORBIC ACID) 500 MG tablet Take 500 mg by mouth daily.   vitamin E 400 UNIT capsule Take 400 Units by mouth daily.     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 [employer] Tobacco Use   Smoking status: Never   Smokeless  tobacco: Never  Vaping Use   Vaping Use: Never used  Substance and Sexual Activity   Alcohol use: No    Alcohol/week: 0.0 standard drinks of alcohol   Drug use: No   Sexual activity: Not on file  Other Topics Concern   Not on file  Social History Narrative   Not on file   Social Determinants of Health   Financial Resource Strain: Not on file  Food Insecurity: No Food Insecurity (10/25/2021)   Hunger Vital Sign    Worried About Running Out of Food in the Last Year: Never true    Ran Out of Food in the Last Year: Never true  Transportation Needs: No Transportation Needs (10/25/2021)   PRAPARE - THydrologist(Medical): No    Lack of Transportation (Non-Medical): No  Physical Activity: Not on file  Stress: Not on file  Social Connections: Not on file     Family History: The patient's family history includes Breast cancer in her sister; COPD in her mother; Colon polyps in her mother; Emphysema in her father and mother; Heart disease in her father and mother. There is no history of Colon cancer, Esophageal cancer, Stomach cancer, or Rectal cancer.  ROS:   Please see the history of present illness.     EKGs/Labs/Other Studies Reviewed:    The following studies were reviewed:  Echocardiogram 10/22/2021: Impressions: 1. Septal, apical and inferior apical hypokinesis . Left ventricular  ejection fraction, by estimation, is 45 to 50%. The left ventricle has  mildly decreased function. The left ventricle has no regional wall motion  abnormalities. The left ventricular  internal cavity size was mildly dilated. There is mild left ventricular  hypertrophy. Left ventricular diastolic parameters are consistent with  Grade I diastolic dysfunction (impaired relaxation).   2. Right ventricular systolic function is normal. The right ventricular  size is normal.   3. The mitral valve is abnormal. Trivial mitral valve regurgitation. No  evidence of mitral stenosis.  Moderate mitral annular calcification.   4. The aortic valve is tricuspid. There is mild calcification of the  aortic valve. There is mild thickening of the aortic valve. Aortic valve  regurgitation is mild. Aortic valve sclerosis is present, with no evidence  of aortic valve stenosis.   5. The inferior vena cava is normal in size with greater than 50%  respiratory variability, suggesting right atrial pressure of 3 mmHg.  _______________  Left Cardiac Catheterization 10/24/2021:   1st Diag lesion is 50% stenosed.  Prox LAD to Mid LAD lesion is 95% stenosed.   Mid LAD to Dist LAD lesion is 40% stenosed.   A drug-eluting stent was successfully placed using a SYNERGY XD 2.50X28.   Balloon angioplasty was performed using a BALLN SAPPHIRE 2.0X12.   Post intervention, there is a 0% residual stenosis.   Post intervention, there is a 0% residual stenosis.   LV end diastolic pressure is normal.   Single vessel obstructive disease with complex bifurcation LAD/first diagonal stenosis [place] class 1,1,1.  Normal LVEDP Successful PCI of the LAD/diagonal bifurcation with DES in the LAD and POBA of the diagonal side branch   Plan: DAPT for one year. Anticipate DC tomorrow if stable.  Diagnostic Dominance: Right  Intervention       EKG:  EKG not ordered today.   Recent Labs: 10/22/2021: TSH 8.344 10/24/2021: Magnesium 2.2 10/26/2021: BUN 25; Creatinine, Ser 0.90; Hemoglobin 13.1; Platelets 159; Potassium 4.0; Sodium 136  Recent Lipid Panel    Component Value Date/Time   CHOL 196 10/22/2021 0609   TRIG 299 (H) 10/22/2021 0609   HDL 55 10/22/2021 0609   CHOLHDL 3.6 10/22/2021 0609   VLDL 60 (H) 10/22/2021 0609   LDLCALC 81 10/22/2021 0609    Physical Exam:    Vital Signs: BP (!) 126/58   Pulse 74   Ht _0  (1.702 m)   Wt 133 lb 12.8 oz (60.7 kg)   SpO2 94%   BMI 20.96 kg/m     Wt Readings from Last 3 Encounters:  01/24/22 133 lb 12.8 oz (60.7 kg)  11/02/21 140 lb (63.5 kg)   10/23/21 139 lb 8.8 oz (63.3 kg)     General: 81 y.o. thin Caucasian female in no acute distress. HEENT: Normocephalic and atraumatic. Sclera clear.  Neck: Supple. No carotid bruits. No JVD. Heart: RRR. Distinct S1 and S2. No murmurs, gallops, or rubs. Radial pulses 2+ and equal bilaterally. Lungs: No increased work of breathing. Possible very faint crackles in left base but otherwise lungs clear to auscultation.  Abdomen: Soft, non-distended, and non-tender to palpation.  Extremities: No lower extremity edema.    Skin: Warm and dry. Neuro: Alert and oriented x3. No focal deficits. Psych: Normal affect. Responds appropriately.  Assessment:    1. Coronary artery disease involving native coronary artery of native heart without angina pectoris   2. Ischemic cardiomyopathy   3. Hypertension, unspecified type   4. Hyperlipidemia, unspecified hyperlipidemia type     Plan:    CAD  Ischemic Cardiomyopathy Patient was recently admitted with NSTEMI s/p DES to LAD and POBA to 1st Diag. LVEF of 45-50% with septal, apical, and inferior apical hypokinesis and grade 1 diastolic dysfunction as well as moderate MAC and aortic valve sclerosis without evidence of AS. - No recurrent chest pain. - Continue DAPT with Aspirin and Plavix. - Continue Toprol-XL 68m daily and Losartan 258mdaily.  - Continue Lipitor 8043maily. - Will provide prescription of sublingual Nitro for patient to take as needed. Educated patient on how to take this. - Will provide patient a list of heart healthy foods.  Hypertension BP well controlled. - Contineu Toprol-XL and Losartan as above.  Hyperlipidemia Lipid panel on 10/22/2021 during recent admission: Total Cholesterol 196, Triglycerides 299, HDL 55, LDL 81. Lipoprotein A 23.5. LDL goal <70 given CAD. - Continue Lipitor 36m25mily.  - Will repeat lipid panel and CMET.   Disposition: Follow up in 6 months.   Medication Adjustments/Labs and Tests  Ordered: Current  medicines are reviewed at length with the patient today.  Concerns regarding medicines are outlined above.  Orders Placed This Encounter  Procedures   Comprehensive metabolic panel   Lipid panel   Meds ordered this encounter  Medications   nitroGLYCERIN (NITROSTAT) 0.4 MG SL tablet    Sig: Place 1 tablet (0.4 mg total) under the tongue every 5 (five) minutes as needed for chest pain.    Dispense:  25 tablet    Refill:  3    Patient Instructions  Medication Instructions:  No Changes *If you need a refill on your cardiac medications before your next appointment, please call your pharmacy*   Lab Work: CMET, Lipid Taoday If you have labs (blood work) drawn today and your tests are completely normal, you will receive your results only by: [place] (if you have MyChart) OR A paper copy in the mail If you have any lab test that is abnormal or we need to change your treatment, we will call you to review the results.   Testing/Procedures: No Testing   Follow-Up: At [facility], you and your health needs are our priority.  As part of our continuing mission to provide you with exceptional heart care, we have created designated Provider Care Teams.  These Care Teams include your primary Cardiologist (physician) and Advanced Practice Providers (APPs -  Physician Assistants and Nurse Practitioners) who all work together to provide you with the care you need, when you need it.  We recommend signing up for the patient portal called "MyChart".  Sign up information is provided on this After Visit Summary.  MyChart is used to connect with patients for Virtual Visits (Telemedicine).  Patients are able to view lab/test results, encounter notes, upcoming appointments, etc.  Non-urgent messages can be sent to your provider as well.   To learn more about what you can do with MyChart, go to [email or web address].    Your next appointment:   6 month(s)  The format  for your next appointment:   In Person  Provider:   Elouise Munroe, MD     Signed, Darreld Mclean, PA-C  01/24/2022 11:14 AM    [place]

## 2022-01-24 ENCOUNTER — Ambulatory Visit: Payer: Medicare PPO | Attending: Student | Admitting: Student

## 2022-01-24 ENCOUNTER — Encounter: Payer: Self-pay | Admitting: Student

## 2022-01-24 VITALS — BP 126/58 | HR 74 | Ht 67.0 in | Wt 133.8 lb

## 2022-01-24 DIAGNOSIS — I255 Ischemic cardiomyopathy: Secondary | ICD-10-CM | POA: Diagnosis not present

## 2022-01-24 DIAGNOSIS — I2510 Atherosclerotic heart disease of native coronary artery without angina pectoris: Secondary | ICD-10-CM

## 2022-01-24 DIAGNOSIS — I10 Essential (primary) hypertension: Secondary | ICD-10-CM

## 2022-01-24 DIAGNOSIS — E785 Hyperlipidemia, unspecified: Secondary | ICD-10-CM | POA: Diagnosis not present

## 2022-01-24 MED ORDER — NITROGLYCERIN 0.4 MG SL SUBL: 0.4000 mg | SUBLINGUAL_TABLET | SUBLINGUAL | 3 refills | Status: DC | PRN

## 2022-01-24 NOTE — Patient Instructions (Signed)
Medication Instructions:  No Changes *If you need a refill on your cardiac medications before your next appointment, please call your pharmacy*   Lab Work: CMET, Lipid Taoday If you have labs (blood work) drawn today and your tests are completely normal, you will receive your results only by: [place] (if you have MyChart) OR A paper copy in the mail If you have any lab test that is abnormal or we need to change your treatment, we will call you to review the results.   Testing/Procedures: No Testing   Follow-Up: At [facility], you and your health needs are our priority.  As part of our continuing mission to provide you with exceptional heart care, we have created designated Provider Care Teams.  These Care Teams include your primary Cardiologist (physician) and Advanced Practice Providers (APPs -  Physician Assistants and Nurse Practitioners) who all work together to provide you with the care you need, when you need it.  We recommend signing up for the patient portal called "MyChart".  Sign up information is provided on this After Visit Summary.  MyChart is used to connect with patients for Virtual Visits (Telemedicine).  Patients are able to view lab/test results, encounter notes, upcoming appointments, etc.  Non-urgent messages can be sent to your provider as well.   To learn more about what you can do with MyChart, go to [email or web address].    Your next appointment:   6 month(s)  The format for your next appointment:   In Person  Provider:   Elouise Munroe, MD

## 2022-02-05 ENCOUNTER — Inpatient Hospital Stay: Payer: Medicare PPO | Attending: Hematology and Oncology | Admitting: Hematology and Oncology

## 2022-02-05 DIAGNOSIS — J22 Unspecified acute lower respiratory infection: Secondary | ICD-10-CM | POA: Diagnosis not present

## 2022-02-05 DIAGNOSIS — B9689 Other specified bacterial agents as the cause of diseases classified elsewhere: Secondary | ICD-10-CM | POA: Diagnosis not present

## 2022-02-05 DIAGNOSIS — J101 Influenza due to other identified influenza virus with other respiratory manifestations: Secondary | ICD-10-CM | POA: Diagnosis not present

## 2022-02-05 DIAGNOSIS — J449 Chronic obstructive pulmonary disease, unspecified: Secondary | ICD-10-CM | POA: Diagnosis not present

## 2022-02-05 DIAGNOSIS — R059 Cough, unspecified: Secondary | ICD-10-CM | POA: Diagnosis not present

## 2022-02-05 NOTE — Assessment & Plan Note (Deleted)
02/04/2020: ilateral lumpectomies ([facility]):  Right breast: IDC, grade 3, 2.2cm, high grade DCIS, involved margins (posterior and lateral), one right axillary lymph node negative for carcinoma Left breast: high grade DCIS, 1.7cm, involved margins (inferior).  HER-2 Negative by FISH, ER+ 15% weak, PR- 0%, Ki67 40%   Treatment Plan: Resection of the positive margins on both breasts.( Jan 2022: No residual cancer) 1. Adjuvant radiation therapy started 04/12/20-05/06/20 2. Followed by antiestrogen therapy.  With anastrozole started 05/05/2020   Anastrozole toxicities: Denies any adverse effects to anastrozole therapy. Bone density August 2021: T score of 1.3   Hospitalization 10/21/2021-10/26/2021: NSTEMI (PCI to LAD/diagonal bifurcation with DES in the LAD and POBA of the diagonal sidebranch)  Breast cancer surveillance: 1.  Breast exam 02/03/2021: Benign 2. mammogram 10/18/2020: Benign breast density category C   RTC in 1 year for follow-up

## 2022-03-05 DIAGNOSIS — H401131 Primary open-angle glaucoma, bilateral, mild stage: Secondary | ICD-10-CM | POA: Diagnosis not present

## 2022-03-05 DIAGNOSIS — H52203 Unspecified astigmatism, bilateral: Secondary | ICD-10-CM | POA: Diagnosis not present

## 2022-04-18 DIAGNOSIS — I700 Atherosclerosis of aorta: Secondary | ICD-10-CM | POA: Diagnosis not present

## 2022-04-18 DIAGNOSIS — Z23 Encounter for immunization: Secondary | ICD-10-CM | POA: Diagnosis not present

## 2022-04-18 DIAGNOSIS — J449 Chronic obstructive pulmonary disease, unspecified: Secondary | ICD-10-CM | POA: Diagnosis not present

## 2022-04-18 DIAGNOSIS — F331 Major depressive disorder, recurrent, moderate: Secondary | ICD-10-CM | POA: Diagnosis not present

## 2022-04-18 DIAGNOSIS — Z0000 Encounter for general adult medical examination without abnormal findings: Secondary | ICD-10-CM | POA: Diagnosis not present

## 2022-04-18 DIAGNOSIS — I10 Essential (primary) hypertension: Secondary | ICD-10-CM | POA: Diagnosis not present

## 2022-04-18 DIAGNOSIS — Z79899 Other long term (current) drug therapy: Secondary | ICD-10-CM | POA: Diagnosis not present

## 2022-04-18 DIAGNOSIS — E039 Hypothyroidism, unspecified: Secondary | ICD-10-CM | POA: Diagnosis not present

## 2022-04-18 DIAGNOSIS — J479 Bronchiectasis, uncomplicated: Secondary | ICD-10-CM | POA: Diagnosis not present

## 2022-04-18 DIAGNOSIS — E559 Vitamin D deficiency, unspecified: Secondary | ICD-10-CM | POA: Diagnosis not present

## 2022-04-19 LAB — LAB REPORT - SCANNED: EGFR: 83

## 2022-05-11 ENCOUNTER — Other Ambulatory Visit: Payer: Self-pay | Admitting: *Deleted

## 2022-05-11 MED ORDER — CLOPIDOGREL BISULFATE 75 MG PO TABS: 75.0000 mg | ORAL_TABLET | Freq: Every day | ORAL | 6 refills | Status: AC

## 2022-05-29 DIAGNOSIS — G8929 Other chronic pain: Secondary | ICD-10-CM | POA: Diagnosis not present

## 2022-05-29 DIAGNOSIS — M129 Arthropathy, unspecified: Secondary | ICD-10-CM | POA: Diagnosis not present

## 2022-05-29 DIAGNOSIS — M25562 Pain in left knee: Secondary | ICD-10-CM | POA: Diagnosis not present

## 2022-06-12 DIAGNOSIS — M25561 Pain in right knee: Secondary | ICD-10-CM | POA: Diagnosis not present

## 2022-06-12 DIAGNOSIS — R768 Other specified abnormal immunological findings in serum: Secondary | ICD-10-CM | POA: Diagnosis not present

## 2022-06-12 DIAGNOSIS — G8929 Other chronic pain: Secondary | ICD-10-CM | POA: Diagnosis not present

## 2022-06-19 ENCOUNTER — Other Ambulatory Visit: Payer: Self-pay

## 2022-06-19 MED ORDER — METOPROLOL SUCCINATE ER 50 MG PO TB24: 50.0000 mg | ORAL_TABLET | Freq: Every day | ORAL | 0 refills | Status: DC

## 2022-06-29 DIAGNOSIS — G8929 Other chronic pain: Secondary | ICD-10-CM | POA: Diagnosis not present

## 2022-06-29 DIAGNOSIS — M25561 Pain in right knee: Secondary | ICD-10-CM | POA: Diagnosis not present

## 2022-06-29 DIAGNOSIS — R768 Other specified abnormal immunological findings in serum: Secondary | ICD-10-CM | POA: Diagnosis not present

## 2022-07-11 DIAGNOSIS — A499 Bacterial infection, unspecified: Secondary | ICD-10-CM | POA: Diagnosis not present

## 2022-07-11 DIAGNOSIS — R1084 Generalized abdominal pain: Secondary | ICD-10-CM | POA: Diagnosis not present

## 2022-07-11 DIAGNOSIS — N390 Urinary tract infection, site not specified: Secondary | ICD-10-CM | POA: Diagnosis not present

## 2022-07-22 ENCOUNTER — Other Ambulatory Visit: Payer: Self-pay

## 2022-07-22 ENCOUNTER — Inpatient Hospital Stay (HOSPITAL_COMMUNITY)
Admission: EM | Admit: 2022-07-22 | Discharge: 2022-07-25 | DRG: 378 | Disposition: A | Discharge status: Home / Self Care | Payer: Medicare HMO | Attending: Internal Medicine | Admitting: Internal Medicine

## 2022-07-22 ENCOUNTER — Emergency Department (HOSPITAL_COMMUNITY): Payer: Medicare HMO

## 2022-07-22 ENCOUNTER — Encounter (HOSPITAL_COMMUNITY): Payer: Self-pay

## 2022-07-22 DIAGNOSIS — I10 Essential (primary) hypertension: Secondary | ICD-10-CM | POA: Diagnosis present

## 2022-07-22 DIAGNOSIS — R112 Nausea with vomiting, unspecified: Secondary | ICD-10-CM | POA: Diagnosis present

## 2022-07-22 DIAGNOSIS — I2510 Atherosclerotic heart disease of native coronary artery without angina pectoris: Secondary | ICD-10-CM | POA: Diagnosis present

## 2022-07-22 DIAGNOSIS — Z885 Allergy status to narcotic agent status: Secondary | ICD-10-CM

## 2022-07-22 DIAGNOSIS — K279 Peptic ulcer, site unspecified, unspecified as acute or chronic, without hemorrhage or perforation: Secondary | ICD-10-CM | POA: Diagnosis not present

## 2022-07-22 DIAGNOSIS — Z7982 Long term (current) use of aspirin: Secondary | ICD-10-CM

## 2022-07-22 DIAGNOSIS — K5730 Diverticulosis of large intestine without perforation or abscess without bleeding: Secondary | ICD-10-CM | POA: Diagnosis present

## 2022-07-22 DIAGNOSIS — D62 Acute posthemorrhagic anemia: Secondary | ICD-10-CM | POA: Diagnosis present

## 2022-07-22 DIAGNOSIS — F32A Depression, unspecified: Secondary | ICD-10-CM | POA: Diagnosis present

## 2022-07-22 DIAGNOSIS — K920 Hematemesis: Secondary | ICD-10-CM

## 2022-07-22 DIAGNOSIS — H409 Unspecified glaucoma: Secondary | ICD-10-CM | POA: Diagnosis present

## 2022-07-22 DIAGNOSIS — K253 Acute gastric ulcer without hemorrhage or perforation: Secondary | ICD-10-CM | POA: Diagnosis present

## 2022-07-22 DIAGNOSIS — K250 Acute gastric ulcer with hemorrhage: Secondary | ICD-10-CM | POA: Diagnosis present

## 2022-07-22 DIAGNOSIS — B965 Pseudomonas (aeruginosa) (mallei) (pseudomallei) as the cause of diseases classified elsewhere: Secondary | ICD-10-CM | POA: Diagnosis present

## 2022-07-22 DIAGNOSIS — E785 Hyperlipidemia, unspecified: Secondary | ICD-10-CM | POA: Diagnosis present

## 2022-07-22 DIAGNOSIS — Z923 Personal history of irradiation: Secondary | ICD-10-CM | POA: Diagnosis not present

## 2022-07-22 DIAGNOSIS — K921 Melena: Secondary | ICD-10-CM | POA: Diagnosis not present

## 2022-07-22 DIAGNOSIS — E039 Hypothyroidism, unspecified: Secondary | ICD-10-CM | POA: Diagnosis present

## 2022-07-22 DIAGNOSIS — Z791 Long term (current) use of non-steroidal anti-inflammatories (NSAID): Secondary | ICD-10-CM

## 2022-07-22 DIAGNOSIS — C50911 Malignant neoplasm of unspecified site of right female breast: Secondary | ICD-10-CM | POA: Diagnosis present

## 2022-07-22 DIAGNOSIS — Z79811 Long term (current) use of aromatase inhibitors: Secondary | ICD-10-CM

## 2022-07-22 DIAGNOSIS — E860 Dehydration: Secondary | ICD-10-CM | POA: Diagnosis present

## 2022-07-22 DIAGNOSIS — I252 Old myocardial infarction: Secondary | ICD-10-CM

## 2022-07-22 DIAGNOSIS — K259 Gastric ulcer, unspecified as acute or chronic, without hemorrhage or perforation: Secondary | ICD-10-CM | POA: Diagnosis not present

## 2022-07-22 DIAGNOSIS — K222 Esophageal obstruction: Secondary | ICD-10-CM | POA: Diagnosis present

## 2022-07-22 DIAGNOSIS — Z7902 Long term (current) use of antithrombotics/antiplatelets: Secondary | ICD-10-CM

## 2022-07-22 DIAGNOSIS — Z79899 Other long term (current) drug therapy: Secondary | ICD-10-CM

## 2022-07-22 DIAGNOSIS — Z888 Allergy status to other drugs, medicaments and biological substances status: Secondary | ICD-10-CM

## 2022-07-22 DIAGNOSIS — Z825 Family history of asthma and other chronic lower respiratory diseases: Secondary | ICD-10-CM | POA: Diagnosis not present

## 2022-07-22 DIAGNOSIS — Z8249 Family history of ischemic heart disease and other diseases of the circulatory system: Secondary | ICD-10-CM

## 2022-07-22 DIAGNOSIS — R079 Chest pain, unspecified: Secondary | ICD-10-CM | POA: Diagnosis not present

## 2022-07-22 DIAGNOSIS — N179 Acute kidney failure, unspecified: Secondary | ICD-10-CM | POA: Diagnosis present

## 2022-07-22 DIAGNOSIS — R110 Nausea: Principal | ICD-10-CM

## 2022-07-22 DIAGNOSIS — Z955 Presence of coronary angioplasty implant and graft: Secondary | ICD-10-CM

## 2022-07-22 DIAGNOSIS — Z90710 Acquired absence of both cervix and uterus: Secondary | ICD-10-CM | POA: Diagnosis not present

## 2022-07-22 DIAGNOSIS — Z83719 Family history of colon polyps, unspecified: Secondary | ICD-10-CM

## 2022-07-22 DIAGNOSIS — K2950 Unspecified chronic gastritis without bleeding: Secondary | ICD-10-CM | POA: Diagnosis not present

## 2022-07-22 DIAGNOSIS — Z9049 Acquired absence of other specified parts of digestive tract: Secondary | ICD-10-CM

## 2022-07-22 DIAGNOSIS — Z803 Family history of malignant neoplasm of breast: Secondary | ICD-10-CM

## 2022-07-22 DIAGNOSIS — J449 Chronic obstructive pulmonary disease, unspecified: Secondary | ICD-10-CM | POA: Diagnosis present

## 2022-07-22 DIAGNOSIS — K264 Chronic or unspecified duodenal ulcer with hemorrhage: Principal | ICD-10-CM | POA: Diagnosis present

## 2022-07-22 DIAGNOSIS — G2581 Restless legs syndrome: Secondary | ICD-10-CM | POA: Diagnosis present

## 2022-07-22 DIAGNOSIS — K8020 Calculus of gallbladder without cholecystitis without obstruction: Secondary | ICD-10-CM | POA: Diagnosis not present

## 2022-07-22 DIAGNOSIS — K2951 Unspecified chronic gastritis with bleeding: Secondary | ICD-10-CM | POA: Diagnosis not present

## 2022-07-22 DIAGNOSIS — R000 Tachycardia, unspecified: Secondary | ICD-10-CM | POA: Diagnosis not present

## 2022-07-22 DIAGNOSIS — K269 Duodenal ulcer, unspecified as acute or chronic, without hemorrhage or perforation: Secondary | ICD-10-CM | POA: Diagnosis not present

## 2022-07-22 DIAGNOSIS — R8271 Bacteriuria: Secondary | ICD-10-CM | POA: Diagnosis present

## 2022-07-22 DIAGNOSIS — I255 Ischemic cardiomyopathy: Secondary | ICD-10-CM | POA: Diagnosis present

## 2022-07-22 LAB — CBC WITH DIFFERENTIAL/PLATELET
Abs Immature Granulocytes: 0.04 10*3/uL (ref 0.00–0.07)
Basophils Absolute: 0.1 10*3/uL (ref 0.0–0.1)
Basophils Relative: 1 %
Eosinophils Absolute: 0.4 10*3/uL (ref 0.0–0.5)
Eosinophils Relative: 4 %
HCT: 38.7 % (ref 36.0–46.0)
Hemoglobin: 12.2 g/dL (ref 12.0–15.0)
Immature Granulocytes: 0 %
Lymphocytes Relative: 15 %
Lymphs Abs: 1.5 10*3/uL (ref 0.7–4.0)
MCH: 29.8 pg (ref 26.0–34.0)
MCHC: 31.5 g/dL (ref 30.0–36.0)
MCV: 94.4 fL (ref 80.0–100.0)
Monocytes Absolute: 0.5 10*3/uL (ref 0.1–1.0)
Monocytes Relative: 5 %
Neutro Abs: 7.5 10*3/uL (ref 1.7–7.7)
Neutrophils Relative %: 75 %
Platelets: 346 10*3/uL (ref 150–400)
RBC: 4.1 MIL/uL (ref 3.87–5.11)
RDW: 13.6 % (ref 11.5–15.5)
WBC: 10 10*3/uL (ref 4.0–10.5)
nRBC: 0 % (ref 0.0–0.2)

## 2022-07-22 LAB — TROPONIN I (HIGH SENSITIVITY): Troponin I (High Sensitivity): 9 ng/L (ref <18)

## 2022-07-22 LAB — COMPREHENSIVE METABOLIC PANEL
ALT: 12 U/L (ref 0–44)
AST: 24 U/L (ref 15–41)
Albumin: 3.7 g/dL (ref 3.5–5.0)
Alkaline Phosphatase: 98 U/L (ref 38–126)
Anion gap: 11 (ref 5–15)
BUN: 31 mg/dL — ABNORMAL HIGH (ref 8–23)
CO2: 30 mmol/L (ref 22–32)
Calcium: 9.3 mg/dL (ref 8.9–10.3)
Chloride: 97 mmol/L — ABNORMAL LOW (ref 98–111)
Creatinine, Ser: 1.3 mg/dL — ABNORMAL HIGH (ref 0.44–1.00)
GFR, Estimated: 41 mL/min — ABNORMAL LOW (ref >60)
Glucose, Bld: 171 mg/dL — ABNORMAL HIGH (ref 70–99)
Potassium: 3.7 mmol/L (ref 3.5–5.1)
Sodium: 138 mmol/L (ref 135–145)
Total Bilirubin: 0.4 mg/dL (ref 0.3–1.2)
Total Protein: 7.8 g/dL (ref 6.5–8.1)

## 2022-07-22 LAB — PROTIME-INR
INR: 1 (ref 0.8–1.2)
Prothrombin Time: 13.7 seconds (ref 11.4–15.2)

## 2022-07-22 LAB — TYPE AND SCREEN
ABO/RH(D): B NEG
Antibody Screen: NEGATIVE

## 2022-07-22 LAB — MAGNESIUM: Magnesium: 2.2 mg/dL (ref 1.7–2.4)

## 2022-07-22 LAB — TSH: TSH: 7.193 u[IU]/mL — ABNORMAL HIGH (ref 0.350–4.500)

## 2022-07-22 LAB — LACTIC ACID, PLASMA: Lactic Acid, Venous: 1.1 mmol/L (ref 0.5–1.9)

## 2022-07-22 MED ORDER — SODIUM CHLORIDE 0.9 % IV BOLUS (SEPSIS)
1000.0000 mL | Freq: Once | INTRAVENOUS | Status: AC
  Administered 2022-07-22: 1000 mL via INTRAVENOUS

## 2022-07-22 MED ORDER — IOHEXOL 350 MG/ML SOLN
80.0000 mL | Freq: Once | INTRAVENOUS | Status: AC | PRN
  Administered 2022-07-22: 80 mL via INTRAVENOUS

## 2022-07-22 NOTE — ED Triage Notes (Signed)
Pt arrives c/o emesis that started about a hour prior to arrival. States emesis was black in color. Takes plavix. Denies hx of GI bleeding. Also states she's been slightly light headed. States that her stools have been normal in consistency and color. Denies abdominal pain at this time, but has been having periumbilical pain the past several days. Was prescribed meloxicam on 5/10 - did not take it today.  Noted to be tachycardic in 130s in triage. Denies CP, SOB, palpitations.

## 2022-07-22 NOTE — ED Provider Notes (Signed)
[place] EMERGENCY DEPARTMENT AT [facility] Provider Note   CSN: [id number] Arrival date & time: 07/22/22  2001     History {Add pertinent medical, surgical, social history, OB history to HPI:1} Chief Complaint  Patient presents with   Emesis    [name] is a 82 y.o. female.   Emesis      Home Medications Prior to Admission medications   Medication Sig Start Date End Date Taking? Authorizing Provider  albuterol (VENTOLIN HFA) 108 (90 Base) MCG/ACT inhaler Inhale 2 puffs into the lungs every 6 (six) hours as needed for wheezing or shortness of breath. 11/27/18   Waymon Budge, MD  anastrozole (ARIMIDEX) 1 MG tablet Take 1 tablet (1 mg total) by mouth daily. 02/03/21   Serena Croissant, MD  aspirin EC 81 MG tablet Take 1 tablet (81 mg total) by mouth daily. Swallow whole. 10/26/21   Regalado, Belkys A, MD  atorvastatin (LIPITOR) 80 MG tablet Take 1 tablet (80 mg total) by mouth daily. 10/26/21   Regalado, Belkys A, MD  azelastine (ASTELIN) 0.1 % nasal spray 1-2 puffs each nostril twice daily if needed Patient taking differently: Place 1-2 sprays into both nostrils daily as needed for rhinitis. 11/26/17   Waymon Budge, MD  benzonatate (TESSALON) 200 MG capsule Take 1 capsule (200 mg total) by mouth 3 (three) times daily as needed for cough. 05/27/20   Jetty Duhamel D, MD  Cholecalciferol (VITAMIN D) 50 MCG (2000 UT) tablet Take 2,000 Units by mouth daily.    [provider]  clopidogrel (PLAVIX) 75 MG tablet Take 1 tablet (75 mg total) by mouth daily with breakfast. 05/11/22   Parke Poisson, MD  Coenzyme Q10 (COQ-10) 100 MG CAPS Take 100 mg by mouth daily.    [provider]  EUTHYROX 75 MCG tablet Take 75 mcg by mouth every morning. 10/11/21   [provider]  ferrous sulfate 325 (65 FE) MG tablet Take 325 mg by mouth daily with breakfast.    [provider]  fluticasone (FLONASE) 50 MCG/ACT nasal spray Place 2 sprays  into both nostrils daily. Patient taking differently: Place 2 sprays into both nostrils daily as needed for allergies. 07/29/13   Ginnie Smart, MD  Fluticasone-Umeclidin-Vilant (TRELEGY ELLIPTA) 100-62.5-25 MCG/ACT AEPB Inhale 1 puff into the lungs daily. Patient taking differently: Inhale 1 puff into the lungs daily as needed (sob/wheezing). 02/16/21   Waymon Budge, MD  folic acid (FOLVITE) 400 MCG tablet Take 400 mcg by mouth daily.    [provider]  latanoprost (XALATAN) 0.005 % ophthalmic solution Place 1 drop into both eyes at bedtime.    [provider]  losartan (COZAAR) 25 MG tablet Take 1 tablet (25 mg total) by mouth daily. 11/02/21   Marjie Skiff E, PA-C  metoprolol succinate (TOPROL-XL) 50 MG 24 hr tablet Take 1 tablet (50 mg total) by mouth daily. Take with or immediately following a meal. 06/19/22   Marjie Skiff E, PA-C  Multiple Minerals (CALCIUM/MAGNESIUM/ZINC) TABS Take 3 tablets by mouth at bedtime.    [provider]  Multiple Vitamin (MULTIVITAMIN) tablet Take 1 tablet by mouth daily.    [provider]  nitroGLYCERIN (NITROSTAT) 0.4 MG SL tablet Place 1 tablet (0.4 mg total) under the tongue every 5 (five) minutes as needed for chest pain. 01/24/22 04/24/22  Corrin Parker, PA-C  NONFORMULARY OR COMPOUNDED ITEM [facility] - Antifungal topical - Terbinafine 3%, Fluconazole 2%, Tea Tree Oil  5%, Urea 10%, Ibuprofen 2%, + Itraconazole 3%, in #38ml DMSO suspension. Apply to affected toenail(s) once at bedtime or twice daily. Patient taking differently: Apply 1 application  topically daily as needed (Toe fungus). [facility] - Antifungal topical - Terbinafine 3%, Fluconazole 2%, Tea Tree Oil 5%, Urea 10%, Ibuprofen 2%, + Itraconazole 3%, in #16ml DMSO suspension. Apply to affected toenail(s) once at bedtime or twice daily. 10/28/18   Vivi Barrack, DPM  pyridOXINE (VITAMIN B6) 100 MG tablet Take 100 mg by mouth  daily.    [provider]  venlafaxine XR (EFFEXOR-XR) 37.5 MG 24 hr capsule Take 37.5 mg by mouth daily. 09/28/21   [provider]  vitamin C (ASCORBIC ACID) 500 MG tablet Take 500 mg by mouth daily.    [provider]  vitamin E 400 UNIT capsule Take 400 Units by mouth daily.    [provider]      Allergies    Augmentin [amoxicillin-pot clavulanate], Black cohosh, Codeine, Hyoscyamine, Oxybutynin chloride, and Minocycline    Review of Systems   Review of Systems  Gastrointestinal:  Positive for vomiting.    Physical Exam Updated Vital Signs BP (!) 105/57 (BP Location: Right Arm)   Pulse (!) 137   Temp 97.6 F (36.4 C) (Oral)   Resp 16   Ht 5\' 7"  (1.702 m)   Wt 60.3 kg   SpO2 99%   BMI 20.83 kg/m  Physical Exam  ED Results / Procedures / Treatments   Labs (all labs ordered are listed, but only abnormal results are displayed) Labs Reviewed - No data to display  EKG None  Radiology No results found.  Procedures Procedures  {Document cardiac monitor, telemetry assessment procedure when appropriate:1}  Medications Ordered in ED Medications - No data to display  ED Course/ Medical Decision Making/ A&P   {   Click here for ABCD2, HEART and other calculatorsREFRESH Note before signing :1}                          Medical Decision Making  ***  {Document critical care time when appropriate:1} {Document review of labs and clinical decision tools ie heart score, Chads2Vasc2 etc:1}  {Document your independent review of radiology images, and any outside records:1} {Document your discussion with family members, caretakers, and with consultants:1} {Document social determinants of health affecting pt's care:1} {Document your decision making why or why not admission, treatments were needed:1} Final Clinical Impression(s) / ED Diagnoses Final diagnoses:  None    Rx / DC Orders ED Discharge Orders     None

## 2022-07-23 ENCOUNTER — Encounter (HOSPITAL_COMMUNITY): Payer: Self-pay | Admitting: Internal Medicine

## 2022-07-23 DIAGNOSIS — K921 Melena: Secondary | ICD-10-CM

## 2022-07-23 DIAGNOSIS — B965 Pseudomonas (aeruginosa) (mallei) (pseudomallei) as the cause of diseases classified elsewhere: Secondary | ICD-10-CM | POA: Diagnosis present

## 2022-07-23 DIAGNOSIS — H409 Unspecified glaucoma: Secondary | ICD-10-CM | POA: Diagnosis present

## 2022-07-23 DIAGNOSIS — N179 Acute kidney failure, unspecified: Secondary | ICD-10-CM | POA: Diagnosis present

## 2022-07-23 DIAGNOSIS — E860 Dehydration: Secondary | ICD-10-CM | POA: Diagnosis present

## 2022-07-23 DIAGNOSIS — Z8249 Family history of ischemic heart disease and other diseases of the circulatory system: Secondary | ICD-10-CM | POA: Diagnosis not present

## 2022-07-23 DIAGNOSIS — K269 Duodenal ulcer, unspecified as acute or chronic, without hemorrhage or perforation: Secondary | ICD-10-CM | POA: Diagnosis not present

## 2022-07-23 DIAGNOSIS — K920 Hematemesis: Secondary | ICD-10-CM

## 2022-07-23 DIAGNOSIS — Z825 Family history of asthma and other chronic lower respiratory diseases: Secondary | ICD-10-CM | POA: Diagnosis not present

## 2022-07-23 DIAGNOSIS — C50911 Malignant neoplasm of unspecified site of right female breast: Secondary | ICD-10-CM | POA: Diagnosis present

## 2022-07-23 DIAGNOSIS — D62 Acute posthemorrhagic anemia: Secondary | ICD-10-CM | POA: Diagnosis present

## 2022-07-23 DIAGNOSIS — R112 Nausea with vomiting, unspecified: Secondary | ICD-10-CM | POA: Diagnosis not present

## 2022-07-23 DIAGNOSIS — K2950 Unspecified chronic gastritis without bleeding: Secondary | ICD-10-CM | POA: Diagnosis not present

## 2022-07-23 DIAGNOSIS — E785 Hyperlipidemia, unspecified: Secondary | ICD-10-CM | POA: Diagnosis present

## 2022-07-23 DIAGNOSIS — Z90710 Acquired absence of both cervix and uterus: Secondary | ICD-10-CM | POA: Diagnosis not present

## 2022-07-23 DIAGNOSIS — E039 Hypothyroidism, unspecified: Secondary | ICD-10-CM | POA: Diagnosis present

## 2022-07-23 DIAGNOSIS — G2581 Restless legs syndrome: Secondary | ICD-10-CM | POA: Diagnosis present

## 2022-07-23 DIAGNOSIS — K259 Gastric ulcer, unspecified as acute or chronic, without hemorrhage or perforation: Secondary | ICD-10-CM | POA: Diagnosis not present

## 2022-07-23 DIAGNOSIS — Z955 Presence of coronary angioplasty implant and graft: Secondary | ICD-10-CM | POA: Diagnosis not present

## 2022-07-23 DIAGNOSIS — K264 Chronic or unspecified duodenal ulcer with hemorrhage: Secondary | ICD-10-CM | POA: Diagnosis present

## 2022-07-23 DIAGNOSIS — K250 Acute gastric ulcer with hemorrhage: Secondary | ICD-10-CM | POA: Diagnosis present

## 2022-07-23 DIAGNOSIS — J449 Chronic obstructive pulmonary disease, unspecified: Secondary | ICD-10-CM | POA: Diagnosis present

## 2022-07-23 DIAGNOSIS — Z923 Personal history of irradiation: Secondary | ICD-10-CM | POA: Diagnosis not present

## 2022-07-23 DIAGNOSIS — I2510 Atherosclerotic heart disease of native coronary artery without angina pectoris: Secondary | ICD-10-CM | POA: Diagnosis present

## 2022-07-23 DIAGNOSIS — K222 Esophageal obstruction: Secondary | ICD-10-CM | POA: Diagnosis present

## 2022-07-23 DIAGNOSIS — I10 Essential (primary) hypertension: Secondary | ICD-10-CM | POA: Diagnosis present

## 2022-07-23 DIAGNOSIS — F32A Depression, unspecified: Secondary | ICD-10-CM | POA: Diagnosis present

## 2022-07-23 DIAGNOSIS — Z885 Allergy status to narcotic agent status: Secondary | ICD-10-CM | POA: Diagnosis not present

## 2022-07-23 DIAGNOSIS — K5730 Diverticulosis of large intestine without perforation or abscess without bleeding: Secondary | ICD-10-CM | POA: Diagnosis present

## 2022-07-23 DIAGNOSIS — Z888 Allergy status to other drugs, medicaments and biological substances status: Secondary | ICD-10-CM | POA: Diagnosis not present

## 2022-07-23 HISTORY — DX: Nausea with vomiting, unspecified: R11.2

## 2022-07-23 LAB — URINALYSIS, ROUTINE W REFLEX MICROSCOPIC
Bilirubin Urine: NEGATIVE
Glucose, UA: NEGATIVE mg/dL
Hgb urine dipstick: NEGATIVE
Ketones, ur: 20 mg/dL — AB
Leukocytes,Ua: NEGATIVE
Nitrite: POSITIVE — AB
Protein, ur: NEGATIVE mg/dL
Specific Gravity, Urine: 1.04 — ABNORMAL HIGH (ref 1.005–1.030)
pH: 8 (ref 5.0–8.0)

## 2022-07-23 LAB — TROPONIN I (HIGH SENSITIVITY): Troponin I (High Sensitivity): 14 ng/L (ref <18)

## 2022-07-23 LAB — ABO/RH: ABO/RH(D): B NEG

## 2022-07-23 LAB — LACTIC ACID, PLASMA: Lactic Acid, Venous: 1 mmol/L (ref 0.5–1.9)

## 2022-07-23 LAB — T4, FREE: Free T4: 0.77 ng/dL (ref 0.61–1.12)

## 2022-07-23 MED ORDER — SODIUM CHLORIDE 0.9% FLUSH
3.0000 mL | Freq: Two times a day (BID) | INTRAVENOUS | Status: DC
  Administered 2022-07-23 – 2022-07-25 (×4): 3 mL via INTRAVENOUS

## 2022-07-23 MED ORDER — ACETAMINOPHEN 650 MG RE SUPP: 650.0000 mg | Freq: Four times a day (QID) | RECTAL | Status: DC | PRN

## 2022-07-23 MED ORDER — UMECLIDINIUM BROMIDE 62.5 MCG/ACT IN AEPB
1.0000 | INHALATION_SPRAY | Freq: Every day | RESPIRATORY_TRACT | Status: DC
  Administered 2022-07-25: 1 via RESPIRATORY_TRACT
  Filled 2022-07-23: qty 7

## 2022-07-23 MED ORDER — LATANOPROST 0.005 % OP SOLN
1.0000 [drp] | Freq: Every day | OPHTHALMIC | Status: DC
  Administered 2022-07-23 – 2022-07-24 (×2): 1 [drp] via OPHTHALMIC
  Filled 2022-07-23: qty 2.5

## 2022-07-23 MED ORDER — ONDANSETRON HCL 4 MG/2ML IJ SOLN: 4.0000 mg | Freq: Four times a day (QID) | INTRAMUSCULAR | Status: DC | PRN

## 2022-07-23 MED ORDER — PANTOPRAZOLE SODIUM 40 MG IV SOLR
40.0000 mg | Freq: Two times a day (BID) | INTRAVENOUS | Status: DC
  Administered 2022-07-23 (×2): 40 mg via INTRAVENOUS
  Filled 2022-07-23 (×2): qty 10

## 2022-07-23 MED ORDER — HYDRALAZINE HCL 20 MG/ML IJ SOLN: 10.0000 mg | Freq: Four times a day (QID) | INTRAMUSCULAR | Status: DC | PRN

## 2022-07-23 MED ORDER — FLUTICASONE FUROATE-VILANTEROL 100-25 MCG/ACT IN AEPB
1.0000 | INHALATION_SPRAY | Freq: Every day | RESPIRATORY_TRACT | Status: DC
  Administered 2022-07-25: 1 via RESPIRATORY_TRACT
  Filled 2022-07-23: qty 28

## 2022-07-23 MED ORDER — ACETAMINOPHEN 325 MG PO TABS: 650.0000 mg | ORAL_TABLET | Freq: Four times a day (QID) | ORAL | Status: DC | PRN

## 2022-07-23 MED ORDER — ONDANSETRON HCL 4 MG/2ML IJ SOLN
4.0000 mg | Freq: Once | INTRAMUSCULAR | Status: AC
  Administered 2022-07-23: 4 mg via INTRAVENOUS
  Filled 2022-07-23: qty 2

## 2022-07-23 MED ORDER — ONDANSETRON HCL 4 MG PO TABS: 4.0000 mg | ORAL_TABLET | Freq: Four times a day (QID) | ORAL | Status: DC | PRN

## 2022-07-23 MED ORDER — CLOPIDOGREL BISULFATE 75 MG PO TABS: 75.0000 mg | ORAL_TABLET | Freq: Every day | ORAL | Status: DC

## 2022-07-23 MED ORDER — SODIUM CHLORIDE 0.9 % IV BOLUS (SEPSIS)
1000.0000 mL | Freq: Once | INTRAVENOUS | Status: AC
  Administered 2022-07-23: 1000 mL via INTRAVENOUS

## 2022-07-23 MED ORDER — ALBUTEROL SULFATE (2.5 MG/3ML) 0.083% IN NEBU: 2.5000 mg | INHALATION_SOLUTION | Freq: Four times a day (QID) | RESPIRATORY_TRACT | Status: DC | PRN

## 2022-07-23 MED ORDER — POTASSIUM CHLORIDE IN NACL 20-0.9 MEQ/L-% IV SOLN
INTRAVENOUS | Status: DC
  Filled 2022-07-23 (×3): qty 1000

## 2022-07-23 NOTE — H&P (Addendum)
History and Physical    Patient: [name] HYQ:[id number] DOB: [birth year] DOA: 07/22/2022 DOS: the patient was seen and examined on 07/23/2022 PCP: Mila Palmer, MD  Patient coming from: Home  Chief Complaint:  Chief Complaint  Patient presents with   Emesis   HPI: [name] is a 82 y.o. female with medical history significant of hypothyroidism, hypertension, bronchiectasis, breast cancer/right DCIS currently on Arimidex, HLD.  Patient presented to the ED with complaints of recurrent nausea and vomiting with emesis black appearing.  Patient reports that she had been having upper abdominal discomfort for 6 weeks.  No change in bowel movements or stool color.  A few days ago she started vomiting.  She noted that her symptoms appear to begin after eating.  24 hours prior to presentation emesis changed to dark in color which was concerning to her.  She had not utilized any Pepto-Bismol for her symptoms.  Overall until the onset of the symptoms patient felt she had been eating well.  In the ED she was afebrile and tachycardic upon presentation with a pulse of 137, BP was 105/57, O2 sats were 99.  Labs revealed an acute kidney injury with a BUN of 31 and a creatinine of 1.3 with baseline creatinine 0.9.  Glucose was elevated at 171.  Other lab work was essentially normal.  CTA for bleeding was negative for acute gastrointestinal bleeding but there was wall thickening and inflammation of the gastric antrum worrisome for gastritis or ulcer disease.  She was also found to have cholelithiasis with no mention of acute cholecystitis.  She also was found to have sigmoid colon diverticulosis.  EDP has requested hospitalist to evaluate the patient for admission.   Review of Systems: As mentioned in the history of present illness. All other systems reviewed and are negative. Past Medical History:  Diagnosis Date   Allergy    Anemia    Breast cancer (HCC)    Cataract    COPD (chronic  obstructive pulmonary disease) (HCC)    Depression    Essential hypertension 11/29/2014   Glaucoma    Hemoptysis    Hypothyroidism    Intractable nausea and vomiting 07/23/2022   Left bundle branch block 03/16/2005   Oxygen deficiency    patient was using oxygen at home, her pulmonologist discontinued it and patient stopped using it on Monday Mar 20, 2015   Personal history of radiation therapy    Restless leg syndrome    Sinusitis    Vertigo    Past Surgical History:  Procedure Laterality Date   ABDOMINAL HYSTERECTOMY     APPENDECTOMY  2009   BREAST CYST ASPIRATION Right 06/12/2016   BREAST LUMPECTOMY Right 02/04/2020   BREAST LUMPECTOMY Left 02/04/2020   BREAST LUMPECTOMY WITH RADIOACTIVE SEED AND SENTINEL LYMPH NODE BIOPSY Bilateral 02/04/2020   Procedure: BILATERAL BREAST LUMPECTOMY WITH RADIOACTIVE SEED , RIGHT X 2, LEFT X 1 AND RIGHT SENTINEL LYMPH NODE MAPPING;  Surgeon: Harriette Bouillon, MD;  Location: [facility] OR;  Service: General;  Laterality: Bilateral;   CORONARY STENT INTERVENTION N/A 10/24/2021   Procedure: CORONARY STENT INTERVENTION;  Surgeon: Swaziland, Peter M, MD;  Location: [facility] INVASIVE CV LAB;  Service: Cardiovascular;  Laterality: N/A;   LEFT HEART CATH AND CORONARY ANGIOGRAPHY N/A 10/24/2021   Procedure: LEFT HEART CATH AND CORONARY ANGIOGRAPHY;  Surgeon: Swaziland, Peter M, MD;  Location: [facility] INVASIVE CV LAB;  Service: Cardiovascular;  Laterality: N/A;   RE-EXCISION OF BREAST LUMPECTOMY Bilateral 03/03/2020   Procedure: RE-EXCISION  BILATERAL BREAST LUMPECTOMY;  Surgeon: Harriette Bouillon, MD;  Location: [facility];  Service: General;  Laterality: Bilateral;   VESICOVAGINAL FISTULA CLOSURE W/ TAH  1992   Social History:  reports that she has never smoked. She has never used smokeless tobacco. She reports that she does not drink alcohol and does not use drugs.  Allergies  Allergen Reactions   Augmentin [Amoxicillin-Pot Clavulanate] Nausea And Vomiting    .Marland KitchenHas  patient had a PCN reaction causing immediate rash, facial/tongue/throat swelling, SOB or lightheadedness with hypotension: No Has patient had a PCN reaction causing severe rash involving mucus membranes or skin necrosis: No Has patient had a PCN reaction that required hospitalization No Has patient had a PCN reaction occurring within the last 10 years: No If all of the above answers are "NO", then may proceed with Cephalosporin use.    Black Cohosh Nausea And Vomiting    Severe GI Upset, sweats   Codeine Nausea And Vomiting    Severe GI upset, sweats   Hyoscyamine Other (See Comments)    Cramps, made urinary symptoms worse   Oxybutynin Chloride Other (See Comments)    Cramps, made urinary symptoms worse   Minocycline Other (See Comments)    Scratchy tongue, swollen lips    Family History  Problem Relation Age of Onset   Emphysema Mother        smoker   Heart disease Mother    COPD Mother    Colon polyps Mother    Emphysema Father        smoker   Heart disease Father    Breast cancer Sister    Colon cancer Neg Hx    Esophageal cancer Neg Hx    Stomach cancer Neg Hx    Rectal cancer Neg Hx     Prior to Admission medications   Medication Sig Start Date End Date Taking? Authorizing Provider  cyclobenzaprine (FLEXERIL) 10 MG tablet Take 10 mg by mouth 3 (three) times daily as needed. 06/29/22  Yes [provider]  meloxicam (MOBIC) 15 MG tablet Take 15 mg by mouth daily. 06/29/22  Yes [provider]  albuterol (VENTOLIN HFA) 108 (90 Base) MCG/ACT inhaler Inhale 2 puffs into the lungs every 6 (six) hours as needed for wheezing or shortness of breath. 11/27/18   Waymon Budge, MD  anastrozole (ARIMIDEX) 1 MG tablet Take 1 tablet (1 mg total) by mouth daily. 02/03/21   Serena Croissant, MD  aspirin EC 81 MG tablet Take 1 tablet (81 mg total) by mouth daily. Swallow whole. 10/26/21   Regalado, Belkys A, MD  atorvastatin (LIPITOR) 80 MG tablet Take 1 tablet (80 mg total)  by mouth daily. 10/26/21   Regalado, Belkys A, MD  azelastine (ASTELIN) 0.1 % nasal spray 1-2 puffs each nostril twice daily if needed Patient taking differently: Place 1-2 sprays into both nostrils daily as needed for rhinitis. 11/26/17   Waymon Budge, MD  benzonatate (TESSALON) 200 MG capsule Take 1 capsule (200 mg total) by mouth 3 (three) times daily as needed for cough. 05/27/20   Jetty Duhamel D, MD  Cholecalciferol (VITAMIN D) 50 MCG (2000 UT) tablet Take 2,000 Units by mouth daily.    [provider]  clopidogrel (PLAVIX) 75 MG tablet Take 1 tablet (75 mg total) by mouth daily with breakfast. 05/11/22   Parke Poisson, MD  Coenzyme Q10 (COQ-10) 100 MG CAPS Take 100 mg by mouth daily.    [provider]  Janann August  75 MCG tablet Take 75 mcg by mouth every morning. 10/11/21   [provider]  ferrous sulfate 325 (65 FE) MG tablet Take 325 mg by mouth daily with breakfast.    [provider]  fluticasone (FLONASE) 50 MCG/ACT nasal spray Place 2 sprays into both nostrils daily. Patient taking differently: Place 2 sprays into both nostrils daily as needed for allergies. 07/29/13   Ginnie Smart, MD  Fluticasone-Umeclidin-Vilant (TRELEGY ELLIPTA) 100-62.5-25 MCG/ACT AEPB Inhale 1 puff into the lungs daily. Patient taking differently: Inhale 1 puff into the lungs daily as needed (sob/wheezing). 02/16/21   Waymon Budge, MD  folic acid (FOLVITE) 400 MCG tablet Take 400 mcg by mouth daily.    [provider]  latanoprost (XALATAN) 0.005 % ophthalmic solution Place 1 drop into both eyes at bedtime.    [provider]  losartan (COZAAR) 25 MG tablet Take 1 tablet (25 mg total) by mouth daily. 11/02/21   Marjie Skiff E, PA-C  metoprolol succinate (TOPROL-XL) 50 MG 24 hr tablet Take 1 tablet (50 mg total) by mouth daily. Take with or immediately following a meal. 06/19/22   Marjie Skiff E, PA-C  Multiple Minerals (CALCIUM/MAGNESIUM/ZINC)  TABS Take 3 tablets by mouth at bedtime.    [provider]  Multiple Vitamin (MULTIVITAMIN) tablet Take 1 tablet by mouth daily.    [provider]  nitroGLYCERIN (NITROSTAT) 0.4 MG SL tablet Place 1 tablet (0.4 mg total) under the tongue every 5 (five) minutes as needed for chest pain. 01/24/22 04/24/22  Corrin Parker, PA-C  NONFORMULARY OR COMPOUNDED ITEM [facility] - Antifungal topical - Terbinafine 3%, Fluconazole 2%, Tea Tree Oil 5%, Urea 10%, Ibuprofen 2%, + Itraconazole 3%, in #8ml DMSO suspension. Apply to affected toenail(s) once at bedtime or twice daily. Patient taking differently: Apply 1 application  topically daily as needed (Toe fungus). [facility] - Antifungal topical - Terbinafine 3%, Fluconazole 2%, Tea Tree Oil 5%, Urea 10%, Ibuprofen 2%, + Itraconazole 3%, in #13ml DMSO suspension. Apply to affected toenail(s) once at bedtime or twice daily. 10/28/18   Vivi Barrack, DPM  pyridOXINE (VITAMIN B6) 100 MG tablet Take 100 mg by mouth daily.    [provider]  venlafaxine XR (EFFEXOR-XR) 37.5 MG 24 hr capsule Take 37.5 mg by mouth daily. 09/28/21   [provider]  vitamin C (ASCORBIC ACID) 500 MG tablet Take 500 mg by mouth daily.    [provider]  vitamin E 400 UNIT capsule Take 400 Units by mouth daily.    [provider]    Physical Exam: Vitals:   07/23/22 0130 07/23/22 0145 07/23/22 0352 07/23/22 0756  BP: 132/72 (!) 141/69 (!) 153/64 (!) 106/57  Pulse: 100 99 (!) 107 98  Resp: (!) 23 19 18 17   Temp:   (!) 97.4 F (36.3 C) 98.1 F (36.7 C)  TempSrc:   Oral Oral  SpO2: 98% 100% 91% 93%  Weight:      Height:       Constitutional: NAD, calm, comfortable Respiratory: clear to auscultation bilaterally, no wheezing, no crackles. Normal respiratory effort. No accessory muscle use.  Cardiovascular: Regular rate and rhythm, no murmurs / rubs / gallops. No extremity edema. 2+ pedal pulses.    Abdomen: Minimal nonfocal abdominal tenderness, no masses palpated. No hepatosplenomegaly. Bowel sounds positive.  Musculoskeletal: no clubbing / cyanosis. No joint deformity upper and lower extremities. Good ROM, no contractures. Normal muscle tone.  Skin: no rashes, lesions, ulcers.  No induration Neurologic: CN 2-12 grossly intact. Sensation intact, DTR normal. Strength 5/5 x all 4 extremities.  Psychiatric: Normal judgment and insight. Alert and oriented x 3. Normal mood.    Data Reviewed:  Sodium 138, potassium 3.7, chloride 97, CO2 30, glucose 171, BUN 31, creatinine 1.3, LFTs are normal  Troponin 9 with normal EKG  Hemoglobin 12.2, hematocrit 38.7, platelets 346,000, no leukocytosis, coags are normal   Assessment and Plan: Black emesis preceded by upper abdominal pain Imaging consistent with acute gastritis Clear liquids GI consultation Hold Plavix  FOB x 3 IV Protonix every 12 hours Hold home baby aspirin and Mobic  Acute kidney injury Baseline creatinine 0.9 with current creatinine 1.3 and a GFR of 41 IV fluid hydration Follow labs Avoid nephrotoxic medications  DES to LAD September 2023 Holding Plavix as above Holding beta-blocker and statin for now  Mild ischemic cardiomyopathy Echocardiogram in 2023 demonstrated an EF of 45 to 50% and diastolic dysfunction grade 1 Holding ARB given suboptimal blood pressure readings  Hypothyroidism with elevated TSH Given recurrent nausea and vomiting will hold home medication Check free T4  Hypertension Current blood pressure suboptimal so we will hold home beta-blocker and ARB Monitor for breakthrough tachycardia-if blood pressure will support continues on IV metoprolol  History of breast cancer/right DCIS on Arimidex Given recurrent nausea and vomiting hold home Arimidex  HLD Hold Lipitor    Advance Care Planning:   Code Status: Full Code   DVT prophylaxis: SCDs  Consults: [facility]  Family  Communication: Patient only  Severity of Illness: The appropriate patient status for this patient is OBSERVATION. Observation status is judged to be reasonable and necessary in order to provide the required intensity of service to ensure the patient's safety. The patient's presenting symptoms, physical exam findings, and initial radiographic and laboratory data in the context of their medical condition is felt to place them at decreased risk for further clinical deterioration. Furthermore, it is anticipated that the patient will be medically stable for discharge from the hospital within 2 midnights of admission.   Author: Junious Silk, NP 07/23/2022 9:44 AM  For on call review [email or web address].    Attending MD note  Patient was independently seen and examined, treatment plan was discussed with the  Advance Practice Provider. I have personally reviewed the clinical findings, labs, ECG, imaging studies and management of this patient in detail. I have also reviewed the orders written for this patient which were under my direction. I agree with the documentation, as recorded by the Advance Practice Provider.   Briefly, [name] is an 82 y.o. female with a history of DES on DAPT, had EGD in 2007 with esophagitis (Dr. Juanda Chance), colonoscopy last in 2018 with left sided diverticula (Dr. Myrtie Neither), breast CA on arimidex among others who presented with dark emesis in the setting of some upper abdominal discomfort. She was tachycardic with an AKI and CTA abd/pelvis showed no active bleeding but there is gastric distention with wall thickening. Given concern for committing with dehydration and AKI as well as need for GI evaluation, she was accepted for admission last night/this morning. She currently feels fine with no further vomiting. No abdominal pain currently. Denies current or recent angina.   No distress, elderly, pleasant Clear, nonlabored RRR, no MRG or edema Soft, NT, ND, +BS Alert,  oriented  - GI consulted and will await their recommendations Re: work up. Holding plavix, NSAID, giving PPI, trend CBC.   Hazeline Junker, MD 07/23/2022 10:11 AM

## 2022-07-23 NOTE — H&P (View-Only) (Signed)
     Consultation  Referring Provider:  TR[facility]  Primary Care Physician:  Wolters, Sharon, MD Primary Gastroenterologist:  Dr. Danis       Reason for Consultation:     Intractable nausea and vomiting  LOS: 0 days          HPI:   [name] is a 82 y.o. female with past medical history significant for  hypothyroidism, hypertension, bronchiectasis, breast cancer/right DCIS currently on Arimidex, NSTEMI, s/p coronary artery stent placement (on Plavix), HLD presents for evaluation of intractable nausea and vomiting.   Patient presented to ED with complaints of nausea and vomiting.  She states last week she had 1 episode of nausea and vomiting (non bloody, bilious emesis) which she attributed to something she ate while hanging out with her son.  Then 2 days ago, Saturday night 6/1, patient had 2 episodes of nausea and vomiting where she reports coffee-ground emesis.  Reports amount was a little bit more than one cup full with both episodes.  Reports associated upper abdominal discomfort which resolved after vomiting ceased.  She states she had 1 episode of dark runny stool Sunday 6/2 and has not had any further bowel movements since.  No previous episodes similar to this.  No further abdominal pain.  No further nausea, vomiting, or melena.  Denies NSAID use.  Is not on aspirin daily.  Denies tobacco/alcohol use.  Takes Plavix (last dose 6/2 AM).  CTA for bleeding was negative.  Showed wall thickening and inflammation of gastric antrum worrisome for gastritis or ulcer disease.  Cholelithiasis without cholecystitis.  Sigmoid diverticulosis.  Hgb 12.2.  AKI with BUN 31, creatinine 1.30, GFR 41.  TSH 7.193.  Fecal occult not collected.  Colonoscopy 03/21/2016 for screening with Dr. Danis: Decreased sphincter tone, small mouth diverticula in the left colon, otherwise normal   Past Medical History:  Diagnosis Date   Allergy    Anemia    Breast cancer (HCC)    Cataract    COPD (chronic  obstructive pulmonary disease) (HCC)    Depression    Essential hypertension 11/29/2014   Glaucoma    Hemoptysis    Hypothyroidism    Intractable nausea and vomiting 07/23/2022   Left bundle branch block 03/16/2005   Oxygen deficiency    patient was using oxygen at home, her pulmonologist discontinued it and patient stopped using it on Monday Mar 20, 2015   Personal history of radiation therapy    Restless leg syndrome    Sinusitis    Vertigo     Surgical History:  She  has a past surgical history that includes Vesicovaginal fistula closure w/ TAH (1992); Appendectomy (2009); Abdominal hysterectomy; Breast cyst aspiration (Right, 06/12/2016); Breast lumpectomy with radioactive seed and sentinel lymph node biopsy (Bilateral, 02/04/2020); Re-excision of breast lumpectomy (Bilateral, 03/03/2020); LEFT HEART CATH AND CORONARY ANGIOGRAPHY (N/A, 10/24/2021); CORONARY STENT INTERVENTION (N/A, 10/24/2021); Breast lumpectomy (Right, 02/04/2020); and Breast lumpectomy (Left, 02/04/2020). Family History:  Her family history includes Breast cancer in her sister; COPD in her mother; Colon polyps in her mother; Emphysema in her father and mother; Heart disease in her father and mother. Social History:   reports that she has never smoked. She has never used smokeless tobacco. She reports that she does not drink alcohol and does not use drugs.  Prior to Admission medications   Medication Sig Start Date End Date Taking? Authorizing Provider  cyclobenzaprine (FLEXERIL) 10 MG tablet Take 10 mg by mouth 3 (three) times   daily as needed. 06/29/22  Yes [provider]  meloxicam (MOBIC) 15 MG tablet Take 15 mg by mouth daily. 06/29/22  Yes [provider]  albuterol (VENTOLIN HFA) 108 (90 Base) MCG/ACT inhaler Inhale 2 puffs into the lungs every 6 (six) hours as needed for wheezing or shortness of breath. Patient not taking: Reported on 07/23/2022 11/27/18   Young, Clinton D, MD  anastrozole  (ARIMIDEX) 1 MG tablet Take 1 tablet (1 mg total) by mouth daily. Patient not taking: Reported on 07/23/2022 02/03/21   Gudena, Vinay, MD  aspirin EC 81 MG tablet Take 1 tablet (81 mg total) by mouth daily. Swallow whole. Patient not taking: Reported on 07/23/2022 10/26/21   Regalado, Belkys A, MD  atorvastatin (LIPITOR) 80 MG tablet Take 1 tablet (80 mg total) by mouth daily. 10/26/21   Regalado, Belkys A, MD  azelastine (ASTELIN) 0.1 % nasal spray 1-2 puffs each nostril twice daily if needed Patient taking differently: Place 1-2 sprays into both nostrils daily as needed for rhinitis. 11/26/17   Young, Clinton D, MD  benzonatate (TESSALON) 200 MG capsule Take 1 capsule (200 mg total) by mouth 3 (three) times daily as needed for cough. 05/27/20   Young, Clinton D, MD  Cholecalciferol (VITAMIN D) 50 MCG (2000 UT) tablet Take 2,000 Units by mouth daily.    [provider]  clopidogrel (PLAVIX) 75 MG tablet Take 1 tablet (75 mg total) by mouth daily with breakfast. 05/11/22   Acharya, Gayatri A, MD  Coenzyme Q10 (COQ-10) 100 MG CAPS Take 100 mg by mouth daily.    [provider]  EUTHYROX 75 MCG tablet Take 75 mcg by mouth every morning. 10/11/21   [provider]  ferrous sulfate 325 (65 FE) MG tablet Take 325 mg by mouth daily with breakfast.    [provider]  fluticasone (FLONASE) 50 MCG/ACT nasal spray Place 2 sprays into both nostrils daily. Patient taking differently: Place 2 sprays into both nostrils daily as needed for allergies. 07/29/13   Hatcher, Jeffrey C, MD  Fluticasone-Umeclidin-Vilant (TRELEGY ELLIPTA) 100-62.5-25 MCG/ACT AEPB Inhale 1 puff into the lungs daily. Patient taking differently: Inhale 1 puff into the lungs daily as needed (sob/wheezing). 02/16/21   Young, Clinton D, MD  folic acid (FOLVITE) 400 MCG tablet Take 400 mcg by mouth daily.    [provider]  latanoprost (XALATAN) 0.005 % ophthalmic solution Place 1 drop into both eyes at bedtime.     [provider]  losartan (COZAAR) 25 MG tablet Take 1 tablet (25 mg total) by mouth daily. 11/02/21   Goodrich, Callie E, PA-C  metoprolol succinate (TOPROL-XL) 50 MG 24 hr tablet Take 1 tablet (50 mg total) by mouth daily. Take with or immediately following a meal. 06/19/22   Goodrich, Callie E, PA-C  Multiple Minerals (CALCIUM/MAGNESIUM/ZINC) TABS Take 3 tablets by mouth at bedtime.    [provider]  Multiple Vitamin (MULTIVITAMIN) tablet Take 1 tablet by mouth daily.    [provider]  nitroGLYCERIN (NITROSTAT) 0.4 MG SL tablet Place 1 tablet (0.4 mg total) under the tongue every 5 (five) minutes as needed for chest pain. 01/24/22 04/24/22  Goodrich, Callie E, PA-C  NONFORMULARY OR COMPOUNDED ITEM [facility] - Antifungal topical - Terbinafine 3%, Fluconazole 2%, Tea Tree Oil 5%, Urea 10%, Ibuprofen 2%, + Itraconazole 3%, in #30ml DMSO suspension. Apply to affected toenail(s) once at bedtime or twice daily. Patient taking differently: Apply 1 application  topically daily as needed (Toe fungus).   [facility] - Antifungal topical - Terbinafine 3%, Fluconazole 2%, Tea Tree Oil 5%, Urea 10%, Ibuprofen 2%, + Itraconazole 3%, in #30ml DMSO suspension. Apply to affected toenail(s) once at bedtime or twice daily. 10/28/18   Wagoner, Matthew R, DPM  pyridOXINE (VITAMIN B6) 100 MG tablet Take 100 mg by mouth daily.    [provider]  venlafaxine XR (EFFEXOR-XR) 37.5 MG 24 hr capsule Take 37.5 mg by mouth daily. 09/28/21   [provider]  vitamin C (ASCORBIC ACID) 500 MG tablet Take 500 mg by mouth daily.    [provider]  vitamin E 400 UNIT capsule Take 400 Units by mouth daily.    [provider]    Current Facility-Administered Medications  Medication Dose Route Frequency Provider Last Rate Last Admin   0.9 % NaCl with KCl 20 mEq/ L  infusion   Intravenous Continuous Grunz, Ryan B, MD 100 mL/hr at 07/23/22 0806 New Bag at  07/23/22 0806   acetaminophen (TYLENOL) tablet 650 mg  650 mg Oral Q6H PRN Ellis, Allison L, NP       Or   acetaminophen (TYLENOL) suppository 650 mg  650 mg Rectal Q6H PRN Ellis, Allison L, NP       albuterol (VENTOLIN HFA) 108 (90 Base) MCG/ACT inhaler 2 puff  2 puff Inhalation Q6H PRN Ellis, Allison L, NP       fluticasone furoate-vilanterol (BREO ELLIPTA) 100-25 MCG/ACT 1 puff  1 puff Inhalation Daily Ellis, Allison L, NP       And   umeclidinium bromide (INCRUSE ELLIPTA) 62.5 MCG/ACT 1 puff  1 puff Inhalation Daily Ellis, Allison L, NP       hydrALAZINE (APRESOLINE) injection 10 mg  10 mg Intravenous Q6H PRN Ellis, Allison L, NP       latanoprost (XALATAN) 0.005 % ophthalmic solution 1 drop  1 drop Both Eyes QHS Ellis, Allison L, NP       ondansetron (ZOFRAN) tablet 4 mg  4 mg Oral Q6H PRN Ellis, Allison L, NP       Or   ondansetron (ZOFRAN) injection 4 mg  4 mg Intravenous Q6H PRN Ellis, Allison L, NP       pantoprazole (PROTONIX) injection 40 mg  40 mg Intravenous Q12H Ellis, Allison L, NP   40 mg at 07/23/22 0958   sodium chloride flush (NS) 0.9 % injection 3 mL  3 mL Intravenous Q12H Ellis, Allison L, NP   3 mL at 07/23/22 0959    Allergies as of 07/22/2022 - Review Complete 07/22/2022  Allergen Reaction Noted   Augmentin [amoxicillin-pot clavulanate] Nausea And Vomiting 09/12/2012   Black cohosh Nausea And Vomiting 08/30/2010   Codeine Nausea And Vomiting 10/02/2015   Hyoscyamine Other (See Comments) 09/12/2012   Oxybutynin chloride Other (See Comments) 09/12/2012   Minocycline Other (See Comments) 06/07/2014    Review of Systems  Constitutional:  Negative for chills, fever and weight loss.  HENT:  Negative for hearing loss and tinnitus.   Eyes:  Negative for blurred vision and double vision.  Respiratory:  Negative for cough and hemoptysis.   Cardiovascular:  Negative for chest pain and palpitations.  Gastrointestinal:  Positive for abdominal pain, diarrhea, melena, nausea  and vomiting. Negative for blood in stool, constipation and heartburn.  Genitourinary:  Negative for dysuria and urgency.  Musculoskeletal:  Negative for myalgias and neck pain.  Skin:  Negative for itching and rash.  Neurological:  Negative for seizures and loss of consciousness.  Psychiatric/Behavioral:    Negative for depression and suicidal ideas.        Physical Exam:  Vital signs in last 24 hours: Temp:  [97.4 F (36.3 C)-98.1 F (36.7 C)] 98.1 F (36.7 C) (06/03 0756) Pulse Rate:  [94-137] 98 (06/03 0756) Resp:  [14-23] 17 (06/03 0756) BP: (105-153)/(56-89) 106/57 (06/03 0756) SpO2:  [91 %-100 %] 93 % (06/03 0756) Weight:  [59 kg-60.3 kg] 60.3 kg (06/02 2009) Last BM Date : 07/22/22 Last BM recorded by nurses in past 5 days No data recorded  Physical Exam Constitutional:      Appearance: Normal appearance. She is not ill-appearing.     Comments: Appears younger than stated age  HENT:     Nose: Nose normal. No congestion.     Mouth/Throat:     Mouth: Mucous membranes are moist.     Pharynx: Oropharynx is clear.  Eyes:     Extraocular Movements: Extraocular movements intact.     Conjunctiva/sclera: Conjunctivae normal.  Cardiovascular:     Rate and Rhythm: Normal rate and regular rhythm.  Pulmonary:     Effort: Pulmonary effort is normal. No respiratory distress.  Abdominal:     General: Abdomen is flat. Bowel sounds are normal. There is no distension.     Palpations: Abdomen is soft. There is no mass.     Tenderness: There is no abdominal tenderness. There is no guarding or rebound.     Hernia: No hernia is present.  Musculoskeletal:        General: No swelling. Normal range of motion.     Cervical back: Normal range of motion and neck supple.  Skin:    General: Skin is warm and dry.     Coloration: Skin is not jaundiced.  Neurological:     General: No focal deficit present.     Mental Status: She is oriented to person, place, and time.  Psychiatric:         Mood and Affect: Mood normal.        Behavior: Behavior normal.        Thought Content: Thought content normal.        Judgment: Judgment normal.      LAB RESULTS: Recent Labs    07/22/22 2029  WBC 10.0  HGB 12.2  HCT 38.7  PLT 346   BMET Recent Labs    07/22/22 2029  NA 138  K 3.7  CL 97*  CO2 30  GLUCOSE 171*  BUN 31*  CREATININE 1.30*  CALCIUM 9.3   LFT Recent Labs    07/22/22 2029  PROT 7.8  ALBUMIN 3.7  AST 24  ALT 12  ALKPHOS 98  BILITOT 0.4   PT/INR Recent Labs    07/22/22 2029  LABPROT 13.7  INR 1.0    STUDIES: DG Chest 2 View  Result Date: 07/22/2022 CLINICAL DATA:  Emesis, tachycardia, pain EXAM: CHEST - 2 VIEW COMPARISON:  10/21/2021 FINDINGS: Cardiac and mediastinal contours are within normal limits. Similar densities in the right perihilar region, likely scarring. No new focal pulmonary opacity. No pleural effusion or pneumothorax. No acute osseous abnormality. IMPRESSION: No acute cardiopulmonary process. Electronically Signed   By: Alison  Vasan M.D.   On: 07/22/2022 23:11   CT ANGIO GI BLEED  Result Date: 07/22/2022 CLINICAL DATA:  Abdominal pain.  Concern for upper GI bleeding. EXAM: CTA ABDOMEN AND PELVIS WITHOUT AND WITH CONTRAST TECHNIQUE: Multidetector CT imaging of the abdomen and pelvis was performed using the standard protocol during bolus administration   of intravenous contrast. Multiplanar reconstructed images and MIPs were obtained and reviewed to evaluate the vascular anatomy. RADIATION DOSE REDUCTION: This exam was performed according to the departmental dose-optimization program which includes automated exposure control, adjustment of the mA and/or kV according to patient size and/or use of iterative reconstruction technique. CONTRAST:  80mL OMNIPAQUE IOHEXOL 350 MG/ML SOLN COMPARISON:  None Available. FINDINGS: VASCULAR Aorta: Normal caliber aorta without aneurysm, dissection, vasculitis or significant stenosis. Moderate calcified  atherosclerotic plaques are present. Celiac: Patent without evidence of aneurysm, dissection, vasculitis or significant stenosis. SMA: Patent without evidence of aneurysm, dissection, vasculitis or significant stenosis. Hepatic artery arises from the SMA. Renals: Both renal arteries are patent without evidence of aneurysm, dissection, vasculitis, fibromuscular dysplasia or significant stenosis. IMA: Patent without evidence of aneurysm, dissection, vasculitis or significant stenosis. Inflow: Patent without evidence of aneurysm, dissection, vasculitis or significant stenosis. Proximal Outflow: Bilateral common femoral and visualized portions of the superficial and profunda femoral arteries are patent without evidence of aneurysm, dissection, vasculitis or significant stenosis. Veins: No obvious venous abnormality within the limitations of this arterial phase study. Review of the MIP images confirms the above findings. NON-VASCULAR Lower chest: There are tree-in-bud opacities in the left lower lobe compatible with infectious/inflammatory process. Hepatobiliary: Gallstones are present. There is no biliary ductal dilatation. The liver is within normal limits. Pancreas: Unremarkable. No pancreatic ductal dilatation or surrounding inflammatory changes. Spleen: Normal in size without focal abnormality. Adrenals/Urinary Tract: Adrenal glands are unremarkable. Kidneys are normal, without renal calculi, focal lesion, or hydronephrosis. Bladder is unremarkable. Stomach/Bowel: There is wall thickening and inflammation of the gastric antrum. The stomach is distended with a large amount of fluid. There is no free air or pneumatosis. The appendix is not visualized. No active gastrointestinal bleeding identified. There is sigmoid colon diverticulosis. Small bowel loops are within normal limits. Lymphatic: No enlarged lymph nodes are seen. Reproductive: Status post hysterectomy. No adnexal masses. Other: Small fat containing  umbilical hernia present.  No ascites. Musculoskeletal: Degenerative changes affect the spine. IMPRESSION: VASCULAR 1. No evidence for active gastrointestinal bleeding. 2. No acute vascular pathology. NON-VASCULAR 1. Wall thickening and inflammation of the gastric antrum worrisome for gastritis or ulcer disease. No free air. The stomach is distended with fluid. 2. Cholelithiasis. 3. Sigmoid colon diverticulosis. 4. Tree-in-bud opacities in the left lower lobe compatible with infectious/inflammatory process. Electronically Signed   By: Amy  Guttmann M.D.   On: 07/22/2022 23:07      Impression    Coffee ground emesis, melena (UGIB) -CTA for bleeding was negative.  Showed wall thickening and inflammation of gastric antrum worrisome for gastritis or ulcer disease.  Cholelithiasis without cholecystitis.  Sigmoid diverticulosis. -Hgb 12.2 - BUN 31 (in the setting of AKI) - Fecal occult not collected Suspect UGIB secondary to possibly gastritis vs PUD. No NSAID use. No previous EGD. No further melena/hematemesis at this time and hgb is stable. BUN elevation secondary to AKI. Last dose plavix was yesterday (6/2 AM). Patient would benefit from EGD, however, would ideally prefer plavix washout prior to procedure. Will discuss timing of EGD with Dr. Shawni Volkov. Patient is also unsure if she would prefer further workup outpatient versus inpatient.  AKI -BUN 31, creatinine 1.30, GFR 41  Hypothyroidism -TSH 7.193 -T4 pending   Plan   Consider EGD for further evaluation. I thoroughly discussed the procedures to include nature, alternatives, benefits, and risks including but not limited to bleeding, perforation, infection, anesthesia/cardiac and pulmonary complications. Patient provides understanding and gave verbal consent   to proceed. Continue Protonix IV 40mg BID Continue clear liquid diet Continue supportive care Hold Plavix  Thank you for your kind consultation, we will continue to follow.   Bayley M  McMichael  07/23/2022, 10:23 AM      Attending physician's note   I have taken history, reviewed the chart and examined the patient. I performed a substantive portion of this encounter, including complete performance of at least one of the key components, in conjunction with the APP. I agree with the Advanced Practitioner's note, impression and recommendations.   N/V/CGE on plavix (last dose 6/2). CTE neg for active bleeding.  Did show gastric antral thickening. No melena or abdo pain or wt loss. HD stable. Has been on Mobic/bASA.  She does have asymptomatic cholelithiasis. No prev EGD. Hb 12 (at baseline)  Plan: -IV protonix -Trend CBC -EGD in AM.  Discussed proc,risks and benefits.  -Avoid Mobic  CTA reviewed independently.   Raj Yahya Boldman, MD [facility] [phone]  

## 2022-07-23 NOTE — TOC CM/SW Note (Signed)
Transition of Care [facility]) - Inpatient Brief Assessment   Patient Details  Name: [name] MRN: [id number] Date of Birth: Dec 04, 1940  Transition of Care [facility]) CM/SW Contact:    Otelia Santee, LCSW Phone Number: 07/23/2022, 11:41 AM   Clinical Narrative: Reviewed pt's chart. No current TOC needs identified. Please consult TOC should needs arise.    Transition of Care Asessment: Insurance and Status: Insurance coverage has been reviewed Patient has primary care physician: Yes Home environment has been reviewed: Lives in an aprtment Prior level of function:: Indepedent/ modified independent Prior/Current Home Services: No current home services Social Determinants of Health Reivew: SDOH reviewed no interventions necessary Readmission risk has been reviewed: Yes Transition of care needs: no transition of care needs at this time

## 2022-07-23 NOTE — Progress Notes (Signed)
Mobility Specialist - Progress Note   07/23/22 1300  Mobility  Activity Ambulated with assistance in hallway  Level of Assistance Standby assist, set-up cues, supervision of patient - no hands on  Assistive Device None  Distance Ambulated (ft) 300 ft  Range of Motion/Exercises Active  Activity Response Tolerated well  Mobility Referral Yes  $Mobility charge 1 Mobility  Mobility Specialist Start Time (ACUTE ONLY) 1245  Mobility Specialist Stop Time (ACUTE ONLY) 1255  Mobility Specialist Time Calculation (min) (ACUTE ONLY) 10 min   Pt received in bed and agreed to mobility. Had no issues throughout session. Pt returned to chair with all needs met.  Marilynne Halsted Mobility Specialist

## 2022-07-23 NOTE — ED Provider Notes (Signed)
I assumed care at signout CT imaging reveals gastric distention with large amount of fluid, potentially gastritis Patient had some improvement, but then started to have nausea and felt she was going to vomit.  She is tachycardic and dehydrated.  Plan to admit, keep her n.p.o., rehydrated and treat her nausea  Discussed with Dr. Margo Aye for admission   Zadie Rhine, MD 07/23/22 0221

## 2022-07-23 NOTE — Consult Note (Addendum)
Consultation  Referring Provider:  [facility]  Primary Care Physician:  Mila Palmer, MD Primary Gastroenterologist:  Dr. Myrtie Neither       Reason for Consultation:     Intractable nausea and vomiting  LOS: 0 days          HPI:   [name] is a 82 y.o. female with past medical history significant for  hypothyroidism, hypertension, bronchiectasis, breast cancer/right DCIS currently on Arimidex, NSTEMI, s/p coronary artery stent placement (on Plavix), HLD presents for evaluation of intractable nausea and vomiting.   Patient presented to ED with complaints of nausea and vomiting.  She states last week she had 1 episode of nausea and vomiting (non bloody, bilious emesis) which she attributed to something she ate while hanging out with her son.  Then 2 days ago, Saturday night 6/1, patient had 2 episodes of nausea and vomiting where she reports coffee-ground emesis.  Reports amount was a little bit more than one cup full with both episodes.  Reports associated upper abdominal discomfort which resolved after vomiting ceased.  She states she had 1 episode of dark runny stool Sunday 6/2 and has not had any further bowel movements since.  No previous episodes similar to this.  No further abdominal pain.  No further nausea, vomiting, or melena.  Denies NSAID use.  Is not on aspirin daily.  Denies tobacco/alcohol use.  Takes Plavix (last dose 6/2 AM).  CTA for bleeding was negative.  Showed wall thickening and inflammation of gastric antrum worrisome for gastritis or ulcer disease.  Cholelithiasis without cholecystitis.  Sigmoid diverticulosis.  Hgb 12.2.  AKI with BUN 31, creatinine 1.30, GFR 41.  TSH 7.193.  Fecal occult not collected.  Colonoscopy 03/21/2016 for screening with Dr. Myrtie Neither: Decreased sphincter tone, small mouth diverticula in the left colon, otherwise normal   Past Medical History:  Diagnosis Date   Allergy    Anemia    Breast cancer (HCC)    Cataract    COPD (chronic  obstructive pulmonary disease) (HCC)    Depression    Essential hypertension 11/29/2014   Glaucoma    Hemoptysis    Hypothyroidism    Intractable nausea and vomiting 07/23/2022   Left bundle branch block 03/16/2005   Oxygen deficiency    patient was using oxygen at home, her pulmonologist discontinued it and patient stopped using it on Monday Mar 20, 2015   Personal history of radiation therapy    Restless leg syndrome    Sinusitis    Vertigo     Surgical History:  She  has a past surgical history that includes Vesicovaginal fistula closure w/ TAH (1992); Appendectomy (2009); Abdominal hysterectomy; Breast cyst aspiration (Right, 06/12/2016); Breast lumpectomy with radioactive seed and sentinel lymph node biopsy (Bilateral, 02/04/2020); Re-excision of breast lumpectomy (Bilateral, 03/03/2020); LEFT HEART CATH AND CORONARY ANGIOGRAPHY (N/A, 10/24/2021); CORONARY STENT INTERVENTION (N/A, 10/24/2021); Breast lumpectomy (Right, 02/04/2020); and Breast lumpectomy (Left, 02/04/2020). Family History:  Her family history includes Breast cancer in her sister; COPD in her mother; Colon polyps in her mother; Emphysema in her father and mother; Heart disease in her father and mother. Social History:   reports that she has never smoked. She has never used smokeless tobacco. She reports that she does not drink alcohol and does not use drugs.  Prior to Admission medications   Medication Sig Start Date End Date Taking? Authorizing Provider  cyclobenzaprine (FLEXERIL) 10 MG tablet Take 10 mg by mouth 3 (three) times  daily as needed. 06/29/22  Yes [provider]  meloxicam (MOBIC) 15 MG tablet Take 15 mg by mouth daily. 06/29/22  Yes [provider]  albuterol (VENTOLIN HFA) 108 (90 Base) MCG/ACT inhaler Inhale 2 puffs into the lungs every 6 (six) hours as needed for wheezing or shortness of breath. Patient not taking: Reported on 07/23/2022 11/27/18   Waymon Budge, MD  anastrozole  (ARIMIDEX) 1 MG tablet Take 1 tablet (1 mg total) by mouth daily. Patient not taking: Reported on 07/23/2022 02/03/21   Serena Croissant, MD  aspirin EC 81 MG tablet Take 1 tablet (81 mg total) by mouth daily. Swallow whole. Patient not taking: Reported on 07/23/2022 10/26/21   Regalado, Jon Billings A, MD  atorvastatin (LIPITOR) 80 MG tablet Take 1 tablet (80 mg total) by mouth daily. 10/26/21   Regalado, Belkys A, MD  azelastine (ASTELIN) 0.1 % nasal spray 1-2 puffs each nostril twice daily if needed Patient taking differently: Place 1-2 sprays into both nostrils daily as needed for rhinitis. 11/26/17   Waymon Budge, MD  benzonatate (TESSALON) 200 MG capsule Take 1 capsule (200 mg total) by mouth 3 (three) times daily as needed for cough. 05/27/20   Jetty Duhamel D, MD  Cholecalciferol (VITAMIN D) 50 MCG (2000 UT) tablet Take 2,000 Units by mouth daily.    [provider]  clopidogrel (PLAVIX) 75 MG tablet Take 1 tablet (75 mg total) by mouth daily with breakfast. 05/11/22   Parke Poisson, MD  Coenzyme Q10 (COQ-10) 100 MG CAPS Take 100 mg by mouth daily.    [provider]  EUTHYROX 75 MCG tablet Take 75 mcg by mouth every morning. 10/11/21   [provider]  ferrous sulfate 325 (65 FE) MG tablet Take 325 mg by mouth daily with breakfast.    [provider]  fluticasone (FLONASE) 50 MCG/ACT nasal spray Place 2 sprays into both nostrils daily. Patient taking differently: Place 2 sprays into both nostrils daily as needed for allergies. 07/29/13   Ginnie Smart, MD  Fluticasone-Umeclidin-Vilant (TRELEGY ELLIPTA) 100-62.5-25 MCG/ACT AEPB Inhale 1 puff into the lungs daily. Patient taking differently: Inhale 1 puff into the lungs daily as needed (sob/wheezing). 02/16/21   Waymon Budge, MD  folic acid (FOLVITE) 400 MCG tablet Take 400 mcg by mouth daily.    [provider]  latanoprost (XALATAN) 0.005 % ophthalmic solution Place 1 drop into both eyes at bedtime.     [provider]  losartan (COZAAR) 25 MG tablet Take 1 tablet (25 mg total) by mouth daily. 11/02/21   Marjie Skiff E, PA-C  metoprolol succinate (TOPROL-XL) 50 MG 24 hr tablet Take 1 tablet (50 mg total) by mouth daily. Take with or immediately following a meal. 06/19/22   Marjie Skiff E, PA-C  Multiple Minerals (CALCIUM/MAGNESIUM/ZINC) TABS Take 3 tablets by mouth at bedtime.    [provider]  Multiple Vitamin (MULTIVITAMIN) tablet Take 1 tablet by mouth daily.    [provider]  nitroGLYCERIN (NITROSTAT) 0.4 MG SL tablet Place 1 tablet (0.4 mg total) under the tongue every 5 (five) minutes as needed for chest pain. 01/24/22 04/24/22  Corrin Parker, PA-C  NONFORMULARY OR COMPOUNDED ITEM [facility] - Antifungal topical - Terbinafine 3%, Fluconazole 2%, Tea Tree Oil 5%, Urea 10%, Ibuprofen 2%, + Itraconazole 3%, in #99ml DMSO suspension. Apply to affected toenail(s) once at bedtime or twice daily. Patient taking differently: Apply 1 application  topically daily as needed (Toe fungus).  [facility] - Antifungal topical - Terbinafine 3%, Fluconazole 2%, Tea Tree Oil 5%, Urea 10%, Ibuprofen 2%, + Itraconazole 3%, in #61ml DMSO suspension. Apply to affected toenail(s) once at bedtime or twice daily. 10/28/18   Vivi Barrack, DPM  pyridOXINE (VITAMIN B6) 100 MG tablet Take 100 mg by mouth daily.    [provider]  venlafaxine XR (EFFEXOR-XR) 37.5 MG 24 hr capsule Take 37.5 mg by mouth daily. 09/28/21   [provider]  vitamin C (ASCORBIC ACID) 500 MG tablet Take 500 mg by mouth daily.    [provider]  vitamin E 400 UNIT capsule Take 400 Units by mouth daily.    [provider]    Current Facility-Administered Medications  Medication Dose Route Frequency Provider Last Rate Last Admin   0.9 % NaCl with KCl 20 mEq/ L  infusion   Intravenous Continuous Tyrone Nine, MD 100 mL/hr at 07/23/22 0806 New Bag at  07/23/22 0806   acetaminophen (TYLENOL) tablet 650 mg  650 mg Oral Q6H PRN Russella Dar, NP       Or   acetaminophen (TYLENOL) suppository 650 mg  650 mg Rectal Q6H PRN Russella Dar, NP       albuterol (VENTOLIN HFA) 108 (90 Base) MCG/ACT inhaler 2 puff  2 puff Inhalation Q6H PRN Russella Dar, NP       fluticasone furoate-vilanterol (BREO ELLIPTA) 100-25 MCG/ACT 1 puff  1 puff Inhalation Daily Russella Dar, NP       And   umeclidinium bromide (INCRUSE ELLIPTA) 62.5 MCG/ACT 1 puff  1 puff Inhalation Daily Russella Dar, NP       hydrALAZINE (APRESOLINE) injection 10 mg  10 mg Intravenous Q6H PRN Russella Dar, NP       latanoprost (XALATAN) 0.005 % ophthalmic solution 1 drop  1 drop Both Eyes QHS Russella Dar, NP       ondansetron (ZOFRAN) tablet 4 mg  4 mg Oral Q6H PRN Russella Dar, NP       Or   ondansetron (ZOFRAN) injection 4 mg  4 mg Intravenous Q6H PRN Russella Dar, NP       pantoprazole (PROTONIX) injection 40 mg  40 mg Intravenous Q12H Russella Dar, NP   40 mg at 07/23/22 0958   sodium chloride flush (NS) 0.9 % injection 3 mL  3 mL Intravenous Q12H Russella Dar, NP   3 mL at 07/23/22 0959    Allergies as of 07/22/2022 - Review Complete 07/22/2022  Allergen Reaction Noted   Augmentin [amoxicillin-pot clavulanate] Nausea And Vomiting 09/12/2012   Black cohosh Nausea And Vomiting 08/30/2010   Codeine Nausea And Vomiting 10/02/2015   Hyoscyamine Other (See Comments) 09/12/2012   Oxybutynin chloride Other (See Comments) 09/12/2012   Minocycline Other (See Comments) 06/07/2014    Review of Systems  Constitutional:  Negative for chills, fever and weight loss.  HENT:  Negative for hearing loss and tinnitus.   Eyes:  Negative for blurred vision and double vision.  Respiratory:  Negative for cough and hemoptysis.   Cardiovascular:  Negative for chest pain and palpitations.  Gastrointestinal:  Positive for abdominal pain, diarrhea, melena, nausea  and vomiting. Negative for blood in stool, constipation and heartburn.  Genitourinary:  Negative for dysuria and urgency.  Musculoskeletal:  Negative for myalgias and neck pain.  Skin:  Negative for itching and rash.  Neurological:  Negative for seizures and loss of consciousness.  Psychiatric/Behavioral:  Negative for depression and suicidal ideas.        Physical Exam:  Vital signs in last 24 hours: Temp:  [97.4 F (36.3 C)-98.1 F (36.7 C)] 98.1 F (36.7 C) (06/03 0756) Pulse Rate:  [94-137] 98 (06/03 0756) Resp:  [14-23] 17 (06/03 0756) BP: (105-153)/(56-89) 106/57 (06/03 0756) SpO2:  [91 %-100 %] 93 % (06/03 0756) Weight:  [59 kg-60.3 kg] 60.3 kg (06/02 2009) Last BM Date : 07/22/22 Last BM recorded by nurses in past 5 days No data recorded  Physical Exam Constitutional:      Appearance: Normal appearance. She is not ill-appearing.     Comments: Appears younger than stated age  HENT:     Nose: Nose normal. No congestion.     Mouth/Throat:     Mouth: Mucous membranes are moist.     Pharynx: Oropharynx is clear.  Eyes:     Extraocular Movements: Extraocular movements intact.     Conjunctiva/sclera: Conjunctivae normal.  Cardiovascular:     Rate and Rhythm: Normal rate and regular rhythm.  Pulmonary:     Effort: Pulmonary effort is normal. No respiratory distress.  Abdominal:     General: Abdomen is flat. Bowel sounds are normal. There is no distension.     Palpations: Abdomen is soft. There is no mass.     Tenderness: There is no abdominal tenderness. There is no guarding or rebound.     Hernia: No hernia is present.  Musculoskeletal:        General: No swelling. Normal range of motion.     Cervical back: Normal range of motion and neck supple.  Skin:    General: Skin is warm and dry.     Coloration: Skin is not jaundiced.  Neurological:     General: No focal deficit present.     Mental Status: She is oriented to person, place, and time.  Psychiatric:         Mood and Affect: Mood normal.        Behavior: Behavior normal.        Thought Content: Thought content normal.        Judgment: Judgment normal.      LAB RESULTS: Recent Labs    07/22/22 2029  WBC 10.0  HGB 12.2  HCT 38.7  PLT 346   BMET Recent Labs    07/22/22 2029  NA 138  K 3.7  CL 97*  CO2 30  GLUCOSE 171*  BUN 31*  CREATININE 1.30*  CALCIUM 9.3   LFT Recent Labs    07/22/22 2029  PROT 7.8  ALBUMIN 3.7  AST 24  ALT 12  ALKPHOS 98  BILITOT 0.4   PT/INR Recent Labs    07/22/22 2029  LABPROT 13.7  INR 1.0    STUDIES: DG Chest 2 View  Result Date: 07/22/2022 CLINICAL DATA:  Emesis, tachycardia, pain EXAM: CHEST - 2 VIEW COMPARISON:  10/21/2021 FINDINGS: Cardiac and mediastinal contours are within normal limits. Similar densities in the right perihilar region, likely scarring. No new focal pulmonary opacity. No pleural effusion or pneumothorax. No acute osseous abnormality. IMPRESSION: No acute cardiopulmonary process. Electronically Signed   By: Wiliam Ke M.D.   On: 07/22/2022 23:11   CT ANGIO GI BLEED  Result Date: 07/22/2022 CLINICAL DATA:  Abdominal pain.  Concern for upper GI bleeding. EXAM: CTA ABDOMEN AND PELVIS WITHOUT AND WITH CONTRAST TECHNIQUE: Multidetector CT imaging of the abdomen and pelvis was performed using the standard protocol during bolus administration  of intravenous contrast. Multiplanar reconstructed images and MIPs were obtained and reviewed to evaluate the vascular anatomy. RADIATION DOSE REDUCTION: This exam was performed according to the departmental dose-optimization program which includes automated exposure control, adjustment of the mA and/or kV according to patient size and/or use of iterative reconstruction technique. CONTRAST:  80mL OMNIPAQUE IOHEXOL 350 MG/ML SOLN COMPARISON:  None Available. FINDINGS: VASCULAR Aorta: Normal caliber aorta without aneurysm, dissection, vasculitis or significant stenosis. Moderate calcified  atherosclerotic plaques are present. Celiac: Patent without evidence of aneurysm, dissection, vasculitis or significant stenosis. SMA: Patent without evidence of aneurysm, dissection, vasculitis or significant stenosis. Hepatic artery arises from the SMA. Renals: Both renal arteries are patent without evidence of aneurysm, dissection, vasculitis, fibromuscular dysplasia or significant stenosis. IMA: Patent without evidence of aneurysm, dissection, vasculitis or significant stenosis. Inflow: Patent without evidence of aneurysm, dissection, vasculitis or significant stenosis. Proximal Outflow: Bilateral common femoral and visualized portions of the superficial and profunda femoral arteries are patent without evidence of aneurysm, dissection, vasculitis or significant stenosis. Veins: No obvious venous abnormality within the limitations of this arterial phase study. Review of the MIP images confirms the above findings. NON-VASCULAR Lower chest: There are tree-in-bud opacities in the left lower lobe compatible with infectious/inflammatory process. Hepatobiliary: Gallstones are present. There is no biliary ductal dilatation. The liver is within normal limits. Pancreas: Unremarkable. No pancreatic ductal dilatation or surrounding inflammatory changes. Spleen: Normal in size without focal abnormality. Adrenals/Urinary Tract: Adrenal glands are unremarkable. Kidneys are normal, without renal calculi, focal lesion, or hydronephrosis. Bladder is unremarkable. Stomach/Bowel: There is wall thickening and inflammation of the gastric antrum. The stomach is distended with a large amount of fluid. There is no free air or pneumatosis. The appendix is not visualized. No active gastrointestinal bleeding identified. There is sigmoid colon diverticulosis. Small bowel loops are within normal limits. Lymphatic: No enlarged lymph nodes are seen. Reproductive: Status post hysterectomy. No adnexal masses. Other: Small fat containing  umbilical hernia present.  No ascites. Musculoskeletal: Degenerative changes affect the spine. IMPRESSION: VASCULAR 1. No evidence for active gastrointestinal bleeding. 2. No acute vascular pathology. NON-VASCULAR 1. Wall thickening and inflammation of the gastric antrum worrisome for gastritis or ulcer disease. No free air. The stomach is distended with fluid. 2. Cholelithiasis. 3. Sigmoid colon diverticulosis. 4. Tree-in-bud opacities in the left lower lobe compatible with infectious/inflammatory process. Electronically Signed   By: Darliss Cheney M.D.   On: 07/22/2022 23:07      Impression    Coffee ground emesis, melena (UGIB) -CTA for bleeding was negative.  Showed wall thickening and inflammation of gastric antrum worrisome for gastritis or ulcer disease.  Cholelithiasis without cholecystitis.  Sigmoid diverticulosis. -Hgb 12.2 - BUN 31 (in the setting of AKI) - Fecal occult not collected Suspect UGIB secondary to possibly gastritis vs PUD. No NSAID use. No previous EGD. No further melena/hematemesis at this time and hgb is stable. BUN elevation secondary to AKI. Last dose plavix was yesterday (6/2 AM). Patient would benefit from EGD, however, would ideally prefer plavix washout prior to procedure. Will discuss timing of EGD with Dr. Chales Abrahams. Patient is also unsure if she would prefer further workup outpatient versus inpatient.  AKI -BUN 31, creatinine 1.30, GFR 41  Hypothyroidism -TSH 7.193 -T4 pending   Plan   Consider EGD for further evaluation. I thoroughly discussed the procedures to include nature, alternatives, benefits, and risks including but not limited to bleeding, perforation, infection, anesthesia/cardiac and pulmonary complications. Patient provides understanding and gave verbal consent  to proceed. Continue Protonix IV 40mg  BID Continue clear liquid diet Continue supportive care Hold Plavix  Thank you for your kind consultation, we will continue to follow.   Bayley Leanna Sato  07/23/2022, 10:23 AM      Attending physician's note   I have taken history, reviewed the chart and examined the patient. I performed a substantive portion of this encounter, including complete performance of at least one of the key components, in conjunction with the APP. I agree with the Advanced Practitioner's note, impression and recommendations.   N/V/CGE on plavix (last dose 6/2). CTE neg for active bleeding.  Did show gastric antral thickening. No melena or abdo pain or wt loss. HD stable. Has been on Mobic/bASA.  She does have asymptomatic cholelithiasis. No prev EGD. Hb 12 (at baseline)  Plan: -IV protonix -Trend CBC -EGD in AM.  Discussed proc,risks and benefits.  -Avoid Mobic  CTA reviewed independently.   Edman Circle, MD [facility] [phone]

## 2022-07-24 ENCOUNTER — Encounter (HOSPITAL_COMMUNITY)
Admission: EM | Disposition: A | Discharge status: Home / Self Care | Payer: Self-pay | Source: Home / Self Care | Attending: Family Medicine

## 2022-07-24 ENCOUNTER — Encounter (HOSPITAL_COMMUNITY): Payer: Self-pay | Admitting: Internal Medicine

## 2022-07-24 ENCOUNTER — Inpatient Hospital Stay (HOSPITAL_COMMUNITY): Payer: Medicare HMO | Admitting: Certified Registered"

## 2022-07-24 DIAGNOSIS — K259 Gastric ulcer, unspecified as acute or chronic, without hemorrhage or perforation: Secondary | ICD-10-CM

## 2022-07-24 DIAGNOSIS — I10 Essential (primary) hypertension: Secondary | ICD-10-CM

## 2022-07-24 DIAGNOSIS — K222 Esophageal obstruction: Secondary | ICD-10-CM

## 2022-07-24 DIAGNOSIS — J449 Chronic obstructive pulmonary disease, unspecified: Secondary | ICD-10-CM

## 2022-07-24 DIAGNOSIS — D62 Acute posthemorrhagic anemia: Secondary | ICD-10-CM

## 2022-07-24 DIAGNOSIS — R112 Nausea with vomiting, unspecified: Secondary | ICD-10-CM | POA: Diagnosis not present

## 2022-07-24 DIAGNOSIS — K920 Hematemesis: Secondary | ICD-10-CM | POA: Insufficient documentation

## 2022-07-24 DIAGNOSIS — K269 Duodenal ulcer, unspecified as acute or chronic, without hemorrhage or perforation: Secondary | ICD-10-CM

## 2022-07-24 DIAGNOSIS — K264 Chronic or unspecified duodenal ulcer with hemorrhage: Secondary | ICD-10-CM

## 2022-07-24 DIAGNOSIS — K2950 Unspecified chronic gastritis without bleeding: Secondary | ICD-10-CM

## 2022-07-24 DIAGNOSIS — K253 Acute gastric ulcer without hemorrhage or perforation: Secondary | ICD-10-CM | POA: Diagnosis present

## 2022-07-24 DIAGNOSIS — K250 Acute gastric ulcer with hemorrhage: Secondary | ICD-10-CM | POA: Diagnosis present

## 2022-07-24 HISTORY — PX: BIOPSY: SHX5522

## 2022-07-24 HISTORY — PX: ESOPHAGOGASTRODUODENOSCOPY: SHX5428

## 2022-07-24 LAB — HEMOGLOBIN AND HEMATOCRIT, BLOOD
HCT: 31.2 % — ABNORMAL LOW (ref 36.0–46.0)
Hemoglobin: 9.5 g/dL — ABNORMAL LOW (ref 12.0–15.0)

## 2022-07-24 LAB — COMPREHENSIVE METABOLIC PANEL
ALT: 12 U/L (ref 0–44)
AST: 16 U/L (ref 15–41)
Albumin: 3.1 g/dL — ABNORMAL LOW (ref 3.5–5.0)
Alkaline Phosphatase: 84 U/L (ref 38–126)
Anion gap: 7 (ref 5–15)
BUN: 14 mg/dL (ref 8–23)
CO2: 23 mmol/L (ref 22–32)
Calcium: 8.6 mg/dL — ABNORMAL LOW (ref 8.9–10.3)
Chloride: 110 mmol/L (ref 98–111)
Creatinine, Ser: 0.65 mg/dL (ref 0.44–1.00)
GFR, Estimated: >60 mL/min (ref >60)
Glucose, Bld: 92 mg/dL (ref 70–99)
Potassium: 4 mmol/L (ref 3.5–5.1)
Sodium: 140 mmol/L (ref 135–145)
Total Bilirubin: 0.3 mg/dL (ref 0.3–1.2)
Total Protein: 6.4 g/dL — ABNORMAL LOW (ref 6.5–8.1)

## 2022-07-24 LAB — CBC
HCT: 30.8 % — ABNORMAL LOW (ref 36.0–46.0)
Hemoglobin: 9.5 g/dL — ABNORMAL LOW (ref 12.0–15.0)
MCH: 30.4 pg (ref 26.0–34.0)
MCHC: 30.8 g/dL (ref 30.0–36.0)
MCV: 98.4 fL (ref 80.0–100.0)
Platelets: 238 10*3/uL (ref 150–400)
RBC: 3.13 MIL/uL — ABNORMAL LOW (ref 3.87–5.11)
RDW: 14 % (ref 11.5–15.5)
WBC: 7.6 10*3/uL (ref 4.0–10.5)
nRBC: 0 % (ref 0.0–0.2)

## 2022-07-24 LAB — HEMOGLOBIN A1C
Hgb A1c MFr Bld: 5.9 % — ABNORMAL HIGH (ref 4.8–5.6)
Mean Plasma Glucose: 123 mg/dL

## 2022-07-24 LAB — URINE CULTURE

## 2022-07-24 SURGERY — EGD (ESOPHAGOGASTRODUODENOSCOPY)
Anesthesia: Monitor Anesthesia Care

## 2022-07-24 MED ORDER — LACTATED RINGERS IV SOLN: INTRAVENOUS | Status: DC

## 2022-07-24 MED ORDER — LIDOCAINE 2% (20 MG/ML) 5 ML SYRINGE
INTRAMUSCULAR | Status: DC | PRN
  Administered 2022-07-24: 100 mg via INTRAVENOUS

## 2022-07-24 MED ORDER — METOPROLOL SUCCINATE ER 50 MG PO TB24
50.0000 mg | ORAL_TABLET | Freq: Every day | ORAL | Status: DC
  Administered 2022-07-24: 50 mg via ORAL
  Filled 2022-07-24 (×2): qty 1

## 2022-07-24 MED ORDER — DORZOLAMIDE HCL 2 % OP SOLN
1.0000 [drp] | Freq: Three times a day (TID) | OPHTHALMIC | Status: DC
  Administered 2022-07-24 – 2022-07-25 (×3): 1 [drp] via OPHTHALMIC
  Filled 2022-07-24: qty 10

## 2022-07-24 MED ORDER — PROPOFOL 500 MG/50ML IV EMUL
INTRAVENOUS | Status: DC | PRN
  Administered 2022-07-24: 125 ug/kg/min via INTRAVENOUS

## 2022-07-24 MED ORDER — PANTOPRAZOLE SODIUM 40 MG PO TBEC
40.0000 mg | DELAYED_RELEASE_TABLET | Freq: Two times a day (BID) | ORAL | Status: DC
  Administered 2022-07-24 – 2022-07-25 (×2): 40 mg via ORAL
  Filled 2022-07-24 (×2): qty 1

## 2022-07-24 MED ORDER — LEVOTHYROXINE SODIUM 75 MCG PO TABS
75.0000 ug | ORAL_TABLET | Freq: Every day | ORAL | Status: DC
  Administered 2022-07-25: 75 ug via ORAL
  Filled 2022-07-24: qty 1

## 2022-07-24 MED ORDER — PROPOFOL 10 MG/ML IV BOLUS
INTRAVENOUS | Status: DC | PRN
  Administered 2022-07-24: 20 mg via INTRAVENOUS

## 2022-07-24 MED ORDER — SODIUM CHLORIDE 0.9 % IV SOLN: INTRAVENOUS | Status: DC

## 2022-07-24 NOTE — Anesthesia Postprocedure Evaluation (Signed)
Anesthesia Post Note  Patient: [name]  Procedure(s) Performed: ESOPHAGOGASTRODUODENOSCOPY (EGD) BIOPSY     Patient location during evaluation: PACU Anesthesia Type: MAC Level of consciousness: awake and alert Pain management: pain level controlled Vital Signs Assessment: post-procedure vital signs reviewed and stable Respiratory status: spontaneous breathing, nonlabored ventilation and respiratory function stable Cardiovascular status: blood pressure returned to baseline Postop Assessment: no apparent nausea or vomiting Anesthetic complications: no   No notable events documented.  Last Vitals:  Vitals:   07/24/22 1020 07/24/22 1043  BP: (!) 122/54 (!) 140/58  Pulse: 82 85  Resp: (!) 21 12  Temp:  (!) 36.3 C  SpO2: 92% 97%    Last Pain:  Vitals:   07/24/22 1043  TempSrc: Oral  PainSc:                  Shanda Howells

## 2022-07-24 NOTE — Procedures (Addendum)
I spoke with the patient's son, Alycia Rossetti, after the procedure by phone. I updated him on the findings as well as treatment recommendations Time provided for questions and answers and he thanked me for the call

## 2022-07-24 NOTE — Progress Notes (Signed)
Mobility Specialist - Progress Note   07/24/22 1127  Mobility  Activity Ambulated with assistance in hallway  Level of Assistance Standby assist, set-up cues, supervision of patient - no hands on  Assistive Device None  Distance Ambulated (ft) 300 ft  Range of Motion/Exercises Active  Activity Response Tolerated well  Mobility Referral Yes  $Mobility charge 1 Mobility  Mobility Specialist Start Time (ACUTE ONLY) 1112  Mobility Specialist Stop Time (ACUTE ONLY) 1125  Mobility Specialist Time Calculation (min) (ACUTE ONLY) 13 min   Pt received in bed and agreed to mobility. Had no issues throughout session, returned to chair with all needs met.  Marilynne Halsted Mobility Specialist

## 2022-07-24 NOTE — Interval H&P Note (Signed)
History and Physical Interval Note: For upper endoscopy today to evaluate coffee-ground emesis and abnormal CT stomach Plavix on hold The nature of the procedure, as well as the risks, benefits, and alternatives were carefully and thoroughly reviewed with the patient. Ample time for discussion and questions allowed. The patient understood, was satisfied, and agreed to proceed.       Latest Ref Rng & Units 07/24/2022    5:30 AM 07/22/2022    8:29 PM 10/26/2021    5:16 PM  CBC  WBC 4.0 - 10.5 K/uL 7.6  10.0  6.3   Hemoglobin 12.0 - 15.0 g/dL 9.5  08.6  57.8   Hematocrit 36.0 - 46.0 % 30.8  38.7  39.7   Platelets 150 - 400 K/uL 238  346  159      07/24/2022 9:19 AM  Voncile S Lukach  has presented today for surgery, with the diagnosis of melena, coffee ground emesis.  The various methods of treatment have been discussed with the patient and family. After consideration of risks, benefits and other options for treatment, the patient has consented to  Procedure(s): ESOPHAGOGASTRODUODENOSCOPY (EGD) (N/A) as a surgical intervention.  The patient's history has been reviewed, patient examined, no change in status, stable for surgery.  I have reviewed the patient's chart and labs.  Questions were answered to the patient's satisfaction.     Carie Caddy Priscillia Fouch

## 2022-07-24 NOTE — Anesthesia Preprocedure Evaluation (Addendum)
Anesthesia Evaluation  Patient identified by MRN, date of birth, ID band Patient awake    Reviewed: Allergy & Precautions, NPO status , Patient's Chart, lab work & pertinent test results, reviewed documented beta blocker date and time   History of Anesthesia Complications Negative for: history of anesthetic complications  Airway Mallampati: II  TM Distance: >3 FB Neck ROM: Full    Dental  (+) Chipped,    Pulmonary COPD   Pulmonary exam normal        Cardiovascular hypertension, Pt. on medications and Pt. on home beta blockers + Past MI  Normal cardiovascular exam     Neuro/Psych    Depression    RLS    GI/Hepatic Neg liver ROS,,,melena, coffee ground emesis   Endo/Other  Hypothyroidism    Renal/GU negative Renal ROS  negative genitourinary   Musculoskeletal negative musculoskeletal ROS (+)    Abdominal   Peds  Hematology  (+) Blood dyscrasia (Hgb 9.5), anemia   Anesthesia Other Findings Glaucoma  Reproductive/Obstetrics negative OB ROS                              Anesthesia Physical Anesthesia Plan  ASA: 3 and emergent  Anesthesia Plan: MAC   Post-op Pain Management: Minimal or no pain anticipated   Induction:   PONV Risk Score and Plan: 2 and Treatment may vary due to age or medical condition and Propofol infusion  Airway Management Planned: Natural Airway and Nasal Cannula  Additional Equipment: None  Intra-op Plan:   Post-operative Plan:   Informed Consent: I have reviewed the patients History and Physical, chart, labs and discussed the procedure including the risks, benefits and alternatives for the proposed anesthesia with the patient or authorized representative who has indicated his/her understanding and acceptance.       Plan Discussed with: CRNA  Anesthesia Plan Comments:         Anesthesia Quick Evaluation

## 2022-07-24 NOTE — Transfer of Care (Signed)
Immediate Anesthesia Transfer of Care Note  Patient: [name]  Procedure(s) Performed: ESOPHAGOGASTRODUODENOSCOPY (EGD) BIOPSY  Patient Location: PACU and Endoscopy Unit  Anesthesia Type:MAC  Level of Consciousness: awake, drowsy, and patient cooperative  Airway & Oxygen Therapy: Patient Spontanous Breathing and Patient connected to face mask oxygen  Post-op Assessment: Report given to RN and Post -op Vital signs reviewed and stable  Post vital signs: Reviewed and stable  Last Vitals:  Vitals Value Taken Time  BP 104/52 07/24/22 1003  Temp    Pulse 82 07/24/22 1005  Resp 20 07/24/22 1005  SpO2 97 % 07/24/22 1005  Vitals shown include unvalidated device data.  Last Pain:  Vitals:   07/24/22 1003  TempSrc:   PainSc: 0-No pain         Complications: No notable events documented.

## 2022-07-24 NOTE — Op Note (Addendum)
[facility] Patient Name: [name] Procedure Date: 07/24/2022 MRN: [id number] Attending MD: Beverley Fiedler , MD, [id number] Date of Birth: [birth year] CSN: [id number] Age: 82 Admit Type: Outpatient Procedure:                Upper GI endoscopy Indications:              Acute post hemorrhagic anemia, Coffee-ground                            emesis, Nausea with vomiting Providers:                Carie Caddy. Rhea Belton, MD, Stephens Shire RN, RN, Harrington Challenger, Technician Referring MD:             [facility] Medicines:                Monitored Anesthesia Care Complications:            No immediate complications. Estimated Blood Loss:     Estimated blood loss: none. Procedure:                Pre-Anesthesia Assessment:                           - Prior to the procedure, a History and Physical                            was performed, and patient medications and                            allergies were reviewed. The patient's tolerance of                            previous anesthesia was also reviewed. The risks                            and benefits of the procedure and the sedation                            options and risks were discussed with the patient.                            All questions were answered, and informed consent                            was obtained. Prior Anticoagulants: The patient has                            taken Plavix (clopidogrel), last dose was 2 days                            prior to procedure. ASA Grade Assessment: III - A  patient with severe systemic disease. After                            reviewing the risks and benefits, the patient was                            deemed in satisfactory condition to undergo the                            procedure.                           After obtaining informed consent, the endoscope was                            passed  under direct vision. Throughout the                            procedure, the patient's blood pressure, pulse, and                            oxygen saturations were monitored continuously. The                            [id number]-H190 ([id number]) Olympus endoscope was introduced                            through the mouth, and advanced to the second part                            of duodenum. The upper GI endoscopy was                            accomplished without difficulty. The patient                            tolerated the procedure well. Scope In: Scope Out: Findings:      Two benign-appearing, intrinsic moderate (circumferential scarring or       stenosis; an endoscope may pass) stenoses were found 37 to 38 cm from       the incisors. The narrowest stenosis measured 1.3 cm (inner diameter) x       1 cm (in length). The stenoses were traversed. Dilation not performed in       setting of GI bleeding and Plavix. The narrowing caused by the       strictures is not severe.      Two non-bleeding cratered gastric ulcers with no stigmata of bleeding       were found on the lesser curvature of the gastric antrum. The largest       lesion was 5 mm in largest dimension. Biopsies were taken with a cold       forceps for histology and Helicobacter pylori testing (around the       ulcers, body, antrum, and incisura).      Two non-bleeding cratered duodenal ulcers, one with pigmented material       were  found in the duodenal bulb. The largest lesion was 20 mm in largest       dimension. No visible vessel was seen and there was no active bleeding.       There is mild narrowing in the bulb but no obstruction.      The second portion of the duodenum was normal. Impression:               - Benign-appearing esophageal stenoses.                           - Non-bleeding gastric ulcers with no stigmata of                            bleeding. Biopsied.                           - Large duodenal bulb ulcers  with pigmented                            material.                           - Normal second portion of the duodenum. Moderate Sedation:      N/A Recommendation:           - Return patient to hospital ward for ongoing care.                           - Advance diet as tolerated.                           - Continue present medications.                           - BID PPI recommended x 12 weeks, and then daily if                            ongoing NSAIDs or ASA is used.                           - No aspirin, ibuprofen, naproxen, or other                            non-steroidal anti-inflammatory drugs for at least                            12 weeks.                           - Would hold Plavix x 7 days and then if stable and                            no melena or further nausea/vomiting this can be                            resumed with close observation.                           -  Await pathology results to exclude H. Pylori.                           - Monitor Hgb closely. Procedure Code(s):        --- Professional ---                           [phone], Esophagogastroduodenoscopy, flexible,                            transoral; with biopsy, single or multiple Diagnosis Code(s):        --- Professional ---                           K22.2, Esophageal obstruction                           K25.9, Gastric ulcer, unspecified as acute or                            chronic, without hemorrhage or perforation                           K26.9, Duodenal ulcer, unspecified as acute or                            chronic, without hemorrhage or perforation                           D62, Acute posthemorrhagic anemia                           K92.0, Hematemesis                           R11.2, Nausea with vomiting, unspecified CPT copyright 2022 [employer]. All rights reserved. The codes documented in this report are preliminary and upon coder review may  be revised to meet  current compliance requirements. Beverley Fiedler, MD 07/24/2022 10:02:10 AM This report has been signed electronically. Number of Addenda: 0

## 2022-07-24 NOTE — Anesthesia Procedure Notes (Signed)
Procedure Name: MAC Date/Time: 07/24/2022 9:32 AM  Performed by: Sindy Guadeloupe, CRNAPre-anesthesia Checklist: Patient identified, Emergency Drugs available, Suction available, Patient being monitored and Timeout performed Oxygen Delivery Method: Simple face mask Placement Confirmation: positive ETCO2

## 2022-07-24 NOTE — Progress Notes (Signed)
[facility] PROGRESS NOTE  [name] (DOB: 25-Jul-1940) ZOX:[id number] PCP: Mila Palmer, MD  Brief Narrative: [name] is an 82 y.o. 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 She was tachycardic with an AKI and CTA abd/pelvis showed no active bleeding but there is gastric distention with wall thickening. Given concern for committing with dehydration and AKI as well as need for GI evaluation, she was accepted for admission. EGD 6/4 showed duodenal ulcers with pigment and clean gastric ulcers. PPI is continued, plavix is held, and NSAIDs avoided. She's had no further emesis or bleeding, BUN normal at 14, though hgb has declined 12.2 > 9.5g/dl. We are restarting diet, monitoring hgb closely.   Subjective: No chest pain, dyspnea, palpitations, orthostatic dizziness. No further emesis, no abd pain.   Objective: BP (!) 140/58 (BP Location: Left Arm)   Pulse 85   Temp (!) 97.3 F (36.3 C) (Oral)   Resp 12   Ht 5\' 7"  (1.702 m)   Wt 60.3 kg   SpO2 97%   BMI 20.83 kg/m   Gen: Elderly pleasant female in no distress Pulm: Clear, nonlabored  CV: RRR, no MRG or edema GI: Soft, NT, ND, +BS  Neuro: Alert and oriented. No new focal deficits. Ext: Warm, no deformities. Skin: No rashes, lesions or ulcers on visualized skin   EGD 6/4 Dr. Rhea Belton:  Impression:       - Benign-appearing esophageal stenoses.                           - Non-bleeding gastric ulcers with no stigmata of                            bleeding. Biopsied.                           - Large duodenal bulb ulcers with pigmented                            material.                           - Normal second portion of the duodenum.  Recommendation: - Return patient to hospital ward for ongoing care.                            - Advance diet as tolerated.                           - Continue present medications.                           - BID PPI recommended x 12 weeks, and then daily if                            ongoing NSAIDs or ASA is used.                           -  No aspirin, ibuprofen, naproxen, or other                            non-steroidal anti-inflammatory drugs for at least                            12 weeks.                           - Would hold Plavix x 7 days and then if stable and                            no melena or further nausea/vomiting this can be                            resumed with close observation.                           - Await pathology results to exclude H. Pylori.                           - Monitor Hgb closely.  Assessment & Plan: Duodenal and gastric ulcers: Seen at EGD.  - PPI BID x12 weeks, holding ASA through that time, then daily PPI thereafter if ASA is restarted. - Avoid NSAIDs (DC mobic) - Monitor biopsy for H. pylori eval   ABLA: Due to duodenal and possible gastric ulcer hemorrhage.  - Continue monitoring H/H serially - Transfusion threshold is probably 8g/dl given her CAD requiring intervention. No current symptoms. T&S is up to date  AKI: Improved with IVF - Can stop IVF now that she's taking po.   Hypothyroidism with elevated TSH: TSH 7.193, free T4 is normal at 0.77.  - Continue thyroid supplement and recommend follow up TFTs in 4-6 weeks.   Asymptomatic bacteriuria: No indication to treat at this time.   CAD s/p DES, HTN, HLD, ICM: No angina. Troponin 9 > 14. Last echo LVEF 45-50%, G1DD, appears euvolemic.  - Continue to hold ASA, plavix in setting of GI bleed - Continue home metoprolol. Will hold ARB with marginal BPs currently.  - Note pt not taking atorvastatin.  - Hold plavix x7 days, will need to be monitored closely when restarting.   Tyrone Nine, MD [facility] [email or web address] 07/24/2022, 12:12 PM

## 2022-07-25 ENCOUNTER — Other Ambulatory Visit: Payer: Self-pay | Admitting: Gastroenterology

## 2022-07-25 DIAGNOSIS — R8271 Bacteriuria: Secondary | ICD-10-CM | POA: Diagnosis present

## 2022-07-25 DIAGNOSIS — K922 Gastrointestinal hemorrhage, unspecified: Secondary | ICD-10-CM

## 2022-07-25 DIAGNOSIS — E039 Hypothyroidism, unspecified: Secondary | ICD-10-CM | POA: Diagnosis not present

## 2022-07-25 DIAGNOSIS — K921 Melena: Secondary | ICD-10-CM | POA: Diagnosis not present

## 2022-07-25 DIAGNOSIS — K920 Hematemesis: Secondary | ICD-10-CM | POA: Diagnosis not present

## 2022-07-25 DIAGNOSIS — N179 Acute kidney failure, unspecified: Secondary | ICD-10-CM | POA: Diagnosis not present

## 2022-07-25 DIAGNOSIS — K264 Chronic or unspecified duodenal ulcer with hemorrhage: Secondary | ICD-10-CM | POA: Diagnosis not present

## 2022-07-25 LAB — URINE CULTURE: Culture: >=100000 — AB

## 2022-07-25 LAB — HEMOGLOBIN AND HEMATOCRIT, BLOOD
HCT: 29.3 % — ABNORMAL LOW (ref 36.0–46.0)
Hemoglobin: 9.3 g/dL — ABNORMAL LOW (ref 12.0–15.0)

## 2022-07-25 LAB — SURGICAL PATHOLOGY

## 2022-07-25 MED ORDER — PANTOPRAZOLE SODIUM 40 MG PO TBEC: 40.0000 mg | DELAYED_RELEASE_TABLET | Freq: Two times a day (BID) | ORAL | 2 refills | Status: DC

## 2022-07-25 NOTE — Hospital Course (Signed)
82yo female with h/o CAD s/p stent on DAPT, hypothyroidism, HTN, bronchiectasis, R breast cancer (DCIS) on Arimidex, and HLD who presented on 6/2 with UGI bleeding.  She underwent EGD which showed duodenal ulcers with pigment and clean gastric ulcers.  She is on PPI BID x 12 weeks and then daily if ongoing NSAIDs or ASA is used (none for at least 12 weeks) and Plavix is being held for 7 days (resume if no n/v or melena).

## 2022-07-25 NOTE — Progress Notes (Addendum)
Progress Note  Primary GI: Dr. Myrtie Neither  LOS: 2 days   Chief Complaint: GI bleed   Subjective   Patient states she has had no further melena. Denies nausea and vomiting. Denies abdominal pain. Tolerating diet without difficulty.    Objective   Vital signs in last 24 hours: Temp:  [97.3 F (36.3 C)-98 F (36.7 C)] 97.4 F (36.3 C) (06/05 0626) Pulse Rate:  [68-95] 68 (06/05 0626) Resp:  [12-24] 17 (06/05 0626) BP: (104-140)/(52-62) 120/54 (06/05 0626) SpO2:  [92 %-100 %] 97 % (06/05 0626) Last BM Date : 07/22/22 Last BM recorded by nurses in past 5 days No data recorded  General:   female in no acute distress  Heart:  Regular rate and rhythm; no murmurs Pulm: Clear anteriorly; no wheezing Abdomen: soft, nondistended, normal bowel sounds in all quadrants. Nontender without guarding. No organomegaly appreciated. Extremities:  No edema Neurologic:  Alert and  oriented x4;  No focal deficits.  Psych:  Cooperative. Normal mood and affect.  Intake/Output from previous day: 06/04 0701 - 06/05 0700 In: 677.1 [P.O.:360; I.V.:317.1] Out: -  Intake/Output this shift: No intake/output data recorded.  Studies/Results: No results found.  Lab Results: Recent Labs    07/22/22 2029 07/24/22 0530 07/24/22 1340 07/25/22 0527  WBC 10.0 7.6  --   --   HGB 12.2 9.5* 9.5* 9.3*  HCT 38.7 30.8* 31.2* 29.3*  PLT 346 238  --   --    BMET Recent Labs    07/22/22 2029 07/24/22 0530  NA 138 140  K 3.7 4.0  CL 97* 110  CO2 30 23  GLUCOSE 171* 92  BUN 31* 14  CREATININE 1.30* 0.65  CALCIUM 9.3 8.6*   LFT Recent Labs    07/24/22 0530  PROT 6.4*  ALBUMIN 3.1*  AST 16  ALT 12  ALKPHOS 84  BILITOT 0.3   PT/INR Recent Labs    07/22/22 2029  LABPROT 13.7  INR 1.0     Scheduled Meds:  dorzolamide  1 drop Both Eyes TID   fluticasone furoate-vilanterol  1 puff Inhalation Daily   And   umeclidinium bromide  1 puff Inhalation Daily   latanoprost  1 drop Both Eyes  QHS   levothyroxine  75 mcg Oral Q0600   metoprolol succinate  50 mg Oral Daily   pantoprazole  40 mg Oral BID AC   sodium chloride flush  3 mL Intravenous Q12H   Continuous Infusions:   Impression:   Coffee ground emesis, melena (UGIB) -CTA for bleeding was negative.  Showed wall thickening and inflammation of gastric antrum worrisome for gastritis or ulcer disease.  Cholelithiasis without cholecystitis.  Sigmoid diverticulosis. - Hgb 9.3, stable - Fecal occult not collected - EGD 6/4: Benign-appearing esophageal stenosis (dilation not performed in the setting of GI bleeding and Plavix).  Nonbleeding gastric ulcers with no stigmata of recent bleeding.  Large duodenal bulb ulcers with pigmented material.  Normal second portion of duodenum.  Biopsies pending Hgb stable, no further bleeding and no symptoms at this time.  EGD showed gastric and duodenal ulcers with biopsies pending.  No further workup from GI standpoint unless overt bleeding occurs.  AKI -BUN 14, creatinine 0.65, GFR greater than 60.  Resolved   Hypothyroidism -TSH 7.193 -T4 0.77    Plan:   -Continue twice daily PPI for 12 weeks.  Avoid NSAIDs. -Hold Plavix, can resume 07/31/2022 -Await biopsy results -Continue to monitor CBC and transfuse to keep hemoglobin above  7 -Outpatient lab appointment to recheck hemoglobin in 2 weeks -follow-up in GI clinic as an outpatient (see discharge)  [name]  07/25/2022, 8:38 AM     Attending physician's note   I have taken history, reviewed the chart and examined the patient. I performed a substantive portion of this encounter, including complete performance of at least one of the key components, in conjunction with the APP. I agree with the Advanced Practitioner's note, impression and recommendations.   As above Pt D/C home before I saw her. Agree with above.    Edman Circle, MD [facility] [phone]

## 2022-07-25 NOTE — Progress Notes (Signed)
Mobility Specialist - Progress Note   07/25/22 0917  Mobility  Activity Ambulated with assistance in hallway  Level of Assistance Contact guard assist, steadying assist  Assistive Device None  Distance Ambulated (ft) 300 ft  Range of Motion/Exercises Active  Activity Response Tolerated well  Mobility Referral Yes  $Mobility charge 1 Mobility  Mobility Specialist Start Time (ACUTE ONLY) [id number]  Mobility Specialist Stop Time (ACUTE ONLY) 0917  Mobility Specialist Time Calculation (min) (ACUTE ONLY) 13 min   Pt received in bed and agreed to mobility, had one instance of LOB, recovered with wall rail. Returned to restroom then chair with all needs met.  Marilynne Halsted Mobility Specialist

## 2022-07-25 NOTE — Discharge Summary (Signed)
Physician Discharge Summary   Patient: [name] MRN: [id number] DOB: Sep 15, 1940  Admit date:     07/22/2022  Discharge date: 07/25/22  Discharge Physician: Jonah Blue   PCP: Mila Palmer, MD   Recommendations at discharge:   Stop taking Mobic (meloxicam) Take Protonix twice daily x 12 weeks and then daily Hold Aspirin for 12 weeks and then discuss with PCP/cardiology about whether to restart Hold Plavix for 7 days (through 6/9) Follow up with PCP in 1 week for recheck and CBC Repeat TSH test in 4-6 weeks Monitor for UTI symptoms Discuss cholesterol therapy with PCP/cardiology   Discharge Diagnoses: Principal Problem:   Duodenal ulcer hemorrhage Active Problems:   Essential hypertension   Hypothyroidism   Status post coronary artery stent placement   HLD (hyperlipidemia)   Acute blood loss anemia   Acute gastric ulcer with hemorrhage   AKI (acute kidney injury) (HCC)   Asymptomatic bacteriuria    Hospital Course: 82yo female with h/o CAD s/p stent on DAPT, hypothyroidism, HTN, bronchiectasis, R breast cancer (DCIS) on Arimidex, and HLD who presented on 6/2 with UGI bleeding.  She underwent EGD which showed duodenal ulcers with pigment and clean gastric ulcers.  She is on PPI BID x 12 weeks and then daily if ongoing NSAIDs or ASA is used (none for at least 12 weeks) and Plavix is being held for 7 days (resume if no n/v or melena).  Assessment and Plan:  Upper GI Bleeding associated with duodenal and gastric ulcers -Patient underwent EGD, ulcers visualized with some pigment noted with the duodenal ulcers, no bleeding with gastric ones -PPI BID x12 weeks, holding ASA through that time, then daily PPI thereafter if ASA is restarted. -Avoid NSAIDs (DC mobic) -Monitor biopsy for H. pylori eval  -Hgb has been stable and she has not required transfusion -Will need PCP f/u within 1 week with repeat CBC -She will need to return to the ER with worsening abdominal  pain or concern for recurrent bleeding  AKI -Noted on admission -Resolved with IVF   Hypothyroidism  -TSH 7.193, free T4 is normal at 0.77.  -Continue thyroid supplement and recommend follow up TFTs in 4-6 weeks.    Asymptomatic bacteriuria -Urine culture grew Pseudomonas but patient is not having symptoms -No indication to treat at this time.    CAD  -s/p DES -Negative troponin, no chest pain -Last echo LVEF 45-50%, G1DD, appears euvolemic -Will h/o CAD, needs to reconsider statin therapy - suggest discussion with PCP  HTN -Resume losartan, Toprol XL  HLD -No longer taking atorvastatin  Glaucoma -Continue dorzolamide, latanoprost    Consultants: GI Procedures performed: EGD  Disposition: Home Diet recommendation:  Regular diet DISCHARGE MEDICATION: Allergies as of 07/25/2022       Reactions   Augmentin [amoxicillin-pot Clavulanate] Nausea And Vomiting   .Marland KitchenHas patient had a PCN reaction causing immediate rash, facial/tongue/throat swelling, SOB or lightheadedness with hypotension: No Has patient had a PCN reaction causing severe rash involving mucus membranes or skin necrosis: No Has patient had a PCN reaction that required hospitalization No Has patient had a PCN reaction occurring within the last 10 years: No If all of the above answers are "NO", then may proceed with Cephalosporin use.   Black Cohosh Nausea And Vomiting   Severe GI Upset, sweats   Codeine Nausea And Vomiting   Severe GI upset, sweats   Hyoscyamine Other (See Comments)   Cramps, made urinary symptoms worse   Oxybutynin Chloride Other (See Comments)  Cramps, made urinary symptoms worse   Minocycline Other (See Comments)   Scratchy tongue, swollen lips        Medication List     STOP taking these medications    anastrozole 1 MG tablet Commonly known as: ARIMIDEX   aspirin EC 81 MG tablet   atorvastatin 80 MG tablet Commonly known as: LIPITOR   cyclobenzaprine 10 MG  tablet Commonly known as: FLEXERIL   meloxicam 15 MG tablet Commonly known as: MOBIC   nitroGLYCERIN 0.4 MG SL tablet Commonly known as: NITROSTAT   NONFORMULARY OR COMPOUNDED ITEM       TAKE these medications    albuterol 108 (90 Base) MCG/ACT inhaler Commonly known as: VENTOLIN HFA Inhale 2 puffs into the lungs every 6 (six) hours as needed for wheezing or shortness of breath.   ascorbic acid 500 MG tablet Commonly known as: VITAMIN C Take 500 mg by mouth daily.   azelastine 0.1 % nasal spray Commonly known as: ASTELIN 1-2 puffs each nostril twice daily if needed What changed:  how much to take how to take this when to take this reasons to take this additional instructions   benzonatate 200 MG capsule Commonly known as: TESSALON Take 1 capsule (200 mg total) by mouth 3 (three) times daily as needed for cough.   Calcium/Magnesium/Zinc Tabs Take 3 tablets by mouth at bedtime.   clopidogrel 75 MG tablet Commonly known as: PLAVIX Take 1 tablet (75 mg total) by mouth daily with breakfast.   CoQ-10 100 MG Caps Take 100 mg by mouth daily.   dorzolamide 2 % ophthalmic solution Commonly known as: TRUSOPT Place 1 drop into both eyes 3 (three) times daily.   Euthyrox 75 MCG tablet Generic drug: levothyroxine Take 75 mcg by mouth every morning.   ferrous sulfate 325 (65 FE) MG tablet Take 325 mg by mouth daily with breakfast.   fluticasone 50 MCG/ACT nasal spray Commonly known as: Flonase Place 2 sprays into both nostrils daily. What changed:  when to take this reasons to take this   latanoprost 0.005 % ophthalmic solution Commonly known as: XALATAN Place 1 drop into both eyes at bedtime.   losartan 25 MG tablet Commonly known as: COZAAR Take 1 tablet (25 mg total) by mouth daily.   metoprolol succinate 50 MG 24 hr tablet Commonly known as: TOPROL-XL Take 1 tablet (50 mg total) by mouth daily. Take with or immediately following a meal.    multivitamin tablet Take 1 tablet by mouth daily.   pantoprazole 40 MG tablet Commonly known as: PROTONIX Take 1 tablet (40 mg total) by mouth 2 (two) times daily before a meal.   pyridOXINE 100 MG tablet Commonly known as: VITAMIN B6 Take 100 mg by mouth daily.   Trelegy Ellipta 100-62.5-25 MCG/ACT Aepb Generic drug: Fluticasone-Umeclidin-Vilant Inhale 1 puff into the lungs daily. What changed:  when to take this reasons to take this   venlafaxine XR 37.5 MG 24 hr capsule Commonly known as: EFFEXOR-XR Take 37.5 mg by mouth daily.   Vitamin D 50 MCG (2000 UT) tablet Take 2,000 Units by mouth daily.   vitamin E 180 MG (400 UNITS) capsule Take 400 Units by mouth daily.        Follow-up Information     Meredith Pel, NP Follow up on 10/09/2022.   Specialty: Gastroenterology Why: appt is at 10am. please arrive 10-15 minutes prior to scheduled appt time. Contact information: [address] [place] [address] [phone]  Discharge Exam: Ceasar Mons Weights   07/22/22 2008 07/22/22 2009  Weight: 59 kg 60.3 kg   Subjective: She was up walking with the mobility specialist in the hall without difficulty.  She is still feeling some weakness, but is feeling better.  No new concerns.  With discussion she feels like she might be ok with discharge today.   Physical Exam:       Vitals:    07/24/22 1043 07/24/22 1254 07/24/22 1931 07/25/22 0626  BP: (!) 140/58 (!) 129/54 (!) 111/52 (!) 120/54  Pulse: 85 95 85 68  Resp: 12 15 18 17   Temp: (!) 97.3 F (36.3 C) (!) 97.5 F (36.4 C) 98 F (36.7 C) (!) 97.4 F (36.3 C)  TempSrc: Oral Oral      SpO2: 97% 99% 100% 97%  Weight:          Height:            General:  Appears calm and comfortable and is in NAD Eyes:   EOMI, normal lids, iris ENT:  grossly normal hearing, lips & tongue, mmm Neck:  no LAD, masses or thyromegaly Cardiovascular:  RRR, no m/r/g. No LE edema.  Respiratory:   CTA  bilaterally with no wheezes/rales/rhonchi.  Normal respiratory effort. Abdomen:  soft, NT, ND Skin:  no rash or induration seen on limited exam Musculoskeletal:  grossly normal tone BUE/BLE, good ROM, no bony abnormality Psychiatric:  grossly normal mood and affect, speech fluent and appropriate, AOx3 Neurologic:  CN 2-12 grossly intact, moves all extremities in coordinated fashion       EKG: last on 6/2     Labs on Admission: I have personally reviewed the available labs and imaging studies at the time of the admission.   Pertinent labs:     Hgb 9.3, stable Urine culture on 6/3 + for Pseudomonas, sensitive to Cefepime, Ceftazidime, Natasha Bence, Zosyn - thought to be asymptomatic bacteriuria    Family Communication: I spoke with her son by telephone and he is in agreement with discharge to home today.  Condition at discharge: improving  The results of significant diagnostics from this hospitalization (including imaging, microbiology, ancillary and laboratory) are listed below for reference.   Imaging Studies: DG Chest 2 View  Result Date: 07/22/2022 CLINICAL DATA:  Emesis, tachycardia, pain EXAM: CHEST - 2 VIEW COMPARISON:  10/21/2021 FINDINGS: Cardiac and mediastinal contours are within normal limits. Similar densities in the right perihilar region, likely scarring. No new focal pulmonary opacity. No pleural effusion or pneumothorax. No acute osseous abnormality. IMPRESSION: No acute cardiopulmonary process. Electronically Signed   By: Wiliam Ke M.D.   On: 07/22/2022 23:11   CT ANGIO GI BLEED  Result Date: 07/22/2022 CLINICAL DATA:  Abdominal pain.  Concern for upper GI bleeding. EXAM: CTA ABDOMEN AND PELVIS WITHOUT AND WITH CONTRAST TECHNIQUE: Multidetector CT imaging of the abdomen and pelvis was performed using the standard protocol during bolus administration of intravenous contrast. Multiplanar reconstructed images and MIPs were obtained and reviewed to evaluate the vascular anatomy.  RADIATION DOSE REDUCTION: This exam was performed according to the departmental dose-optimization program which includes automated exposure control, adjustment of the mA and/or kV according to patient size and/or use of iterative reconstruction technique. CONTRAST:  80mL OMNIPAQUE IOHEXOL 350 MG/ML SOLN COMPARISON:  None Available. FINDINGS: VASCULAR Aorta: Normal caliber aorta without aneurysm, dissection, vasculitis or significant stenosis. Moderate calcified atherosclerotic plaques are present. Celiac: Patent without evidence of aneurysm, dissection, vasculitis or significant stenosis. SMA: Patent without evidence  of aneurysm, dissection, vasculitis or significant stenosis. Hepatic artery arises from the SMA. Renals: Both renal arteries are patent without evidence of aneurysm, dissection, vasculitis, fibromuscular dysplasia or significant stenosis. IMA: Patent without evidence of aneurysm, dissection, vasculitis or significant stenosis. Inflow: Patent without evidence of aneurysm, dissection, vasculitis or significant stenosis. Proximal Outflow: Bilateral common femoral and visualized portions of the superficial and profunda femoral arteries are patent without evidence of aneurysm, dissection, vasculitis or significant stenosis. Veins: No obvious venous abnormality within the limitations of this arterial phase study. Review of the MIP images confirms the above findings. NON-VASCULAR Lower chest: There are tree-in-bud opacities in the left lower lobe compatible with infectious/inflammatory process. Hepatobiliary: Gallstones are present. There is no biliary ductal dilatation. The liver is within normal limits. Pancreas: Unremarkable. No pancreatic ductal dilatation or surrounding inflammatory changes. Spleen: Normal in size without focal abnormality. Adrenals/Urinary Tract: Adrenal glands are unremarkable. Kidneys are normal, without renal calculi, focal lesion, or hydronephrosis. Bladder is unremarkable.  Stomach/Bowel: There is wall thickening and inflammation of the gastric antrum. The stomach is distended with a large amount of fluid. There is no free air or pneumatosis. The appendix is not visualized. No active gastrointestinal bleeding identified. There is sigmoid colon diverticulosis. Small bowel loops are within normal limits. Lymphatic: No enlarged lymph nodes are seen. Reproductive: Status post hysterectomy. No adnexal masses. Other: Small fat containing umbilical hernia present.  No ascites. Musculoskeletal: Degenerative changes affect the spine. IMPRESSION: VASCULAR 1. No evidence for active gastrointestinal bleeding. 2. No acute vascular pathology. NON-VASCULAR 1. Wall thickening and inflammation of the gastric antrum worrisome for gastritis or ulcer disease. No free air. The stomach is distended with fluid. 2. Cholelithiasis. 3. Sigmoid colon diverticulosis. 4. Tree-in-bud opacities in the left lower lobe compatible with infectious/inflammatory process. Electronically Signed   By: Darliss Cheney M.D.   On: 07/22/2022 23:07    Microbiology: Results for orders placed or performed during the hospital encounter of 07/22/22  Urine Culture (for pregnant, neutropenic or urologic patients or patients with an indwelling urinary catheter)     Status: Abnormal   Collection Time: 07/23/22  7:57 AM   Specimen: Urine, Clean Catch  Result Value Ref Range Status   Specimen Description   Final    URINE, CLEAN CATCH Performed at [facility], [address], [place], [place] [address]    Special Requests   Final    NONE Performed at [facility], [address] [place], [place] [address]    Culture >=100,000 COLONIES/mL PSEUDOMONAS AERUGINOSA (A)  Final   Report Status 07/25/2022 FINAL  Final   Organism ID, Bacteria PSEUDOMONAS AERUGINOSA (A)  Final      Susceptibility   Pseudomonas aeruginosa - MIC*    CEFTAZIDIME <=1 SENSITIVE Sensitive     CIPROFLOXACIN 2  RESISTANT Resistant     GENTAMICIN <=1 SENSITIVE Sensitive     IMIPENEM 8 INTERMEDIATE Intermediate     PIP/TAZO <=4 SENSITIVE Sensitive     CEFEPIME 0.5 SENSITIVE Sensitive     * >=100,000 COLONIES/mL PSEUDOMONAS AERUGINOSA     Discharge time spent: greater than 30 minutes.  Signed: Jonah Blue, MD [facility] 07/25/2022

## 2022-07-26 ENCOUNTER — Encounter (HOSPITAL_COMMUNITY): Payer: Self-pay | Admitting: Internal Medicine

## 2022-07-30 ENCOUNTER — Encounter: Payer: Self-pay | Admitting: Internal Medicine

## 2022-07-31 ENCOUNTER — Encounter: Payer: Self-pay | Admitting: Internal Medicine

## 2022-07-31 ENCOUNTER — Ambulatory Visit: Payer: Medicare HMO | Attending: Internal Medicine | Admitting: Internal Medicine

## 2022-07-31 VITALS — BP 134/64 | HR 94 | Ht 67.0 in | Wt 127.6 lb

## 2022-07-31 DIAGNOSIS — K264 Chronic or unspecified duodenal ulcer with hemorrhage: Secondary | ICD-10-CM | POA: Diagnosis not present

## 2022-07-31 DIAGNOSIS — I255 Ischemic cardiomyopathy: Secondary | ICD-10-CM

## 2022-07-31 DIAGNOSIS — I10 Essential (primary) hypertension: Secondary | ICD-10-CM | POA: Diagnosis not present

## 2022-07-31 DIAGNOSIS — Z955 Presence of coronary angioplasty implant and graft: Secondary | ICD-10-CM

## 2022-07-31 DIAGNOSIS — I214 Non-ST elevation (NSTEMI) myocardial infarction: Secondary | ICD-10-CM | POA: Diagnosis not present

## 2022-07-31 DIAGNOSIS — E785 Hyperlipidemia, unspecified: Secondary | ICD-10-CM

## 2022-07-31 DIAGNOSIS — I2510 Atherosclerotic heart disease of native coronary artery without angina pectoris: Secondary | ICD-10-CM | POA: Diagnosis not present

## 2022-07-31 NOTE — Progress Notes (Signed)
Cardiology Office Note:    Date:  07/31/2022   ID:  [name], DOB 1940-12-21, MRN [id number]  PCP:  Mila Palmer, MD  Cardiologist:  Parke Poisson, MD  Electrophysiologist:  None   Referring MD: Mila Palmer, MD   Chief Complaint: follow-up of CAD  History of Present Illness:    [name] is a 82 y.o. female with a history of with a history of CAD with recent NSTEMI s/p DES to LAD and POBA of the 1st Diag on 10/24/2021, ischemic cardiomyopathy with EF of 45-50%, LBBB, hypertension, hyperlipidemia, hypothyroidism, COPD, breast cancer, and depression, presents today for follow-up of CAD.  07/31/2022-patient had an interval hospitalization with acute GI bleed secondary to duodenal ulcers felt contributed to by NSAID use on meloxicam and aspirin.  Told to hold aspirin for 12 weeks and resume Plavix today.  We discussed risks and benefits of continued DAPT in the setting of NSTEMI with need for 12 months of uninterrupted DAPT.  Shared decision making determined we will continue Plavix and monotherapy at this time and likely not resume aspirin as the period of hold coincides with the end of required DAPT.  She is asymptomatic today, with no chest pain or shortness of breath.  Does have some large ecchymoses on the posterior right arm, with no worrisome features.  No interval GI bleeding or nausea or vomiting.  Prior visits: Patient was first seen by Cardiology during recent hospitalization. Patient was admitted from 10/21/2021 to 10/26/2021 for NSTEMI after presenting with chest pain and palpitations. High-sensitivity troponin peaked at 794. Echo showed LVEF of 45-50% with septal, apical, and inferior apical hypokinesis and grade 1 diastolic dysfunction as well as moderate MAC and aortic valve sclerosis without evidence of AS. LHC on 10/24/2021 showed 95% stenosis of proximal to mid LAD, 40% stenosis of mid to distal LAD, and 50% of 1st Diag. Patient underwent successful PCI of  the LAD/Diagonal bifurcation with DES to the LAD and POBA of the 1st Diag side branch. She was started on DAPT with Aspirin and Plavix. She was also diagnosed with UTI and treated with antibiotics.   Patient was seen on 11/02/2021 by Robet Leu, PA-C, at which time she was doing well from a cardiac standpoint with no recurrent chest pain and no shortness of breath. She did report having a mechanical fall after returning home from the hospital and hit her head in the process. She was having some mild confusion after this but head CT showed no acute intracranial abnormalities. She was started on Losartan given mildly elevated BP and mildly reduced EF.   Patient is doing well from a cardiac standpoint. She denies any chest pain. She reports occasional shortness of breath if she is walking quickly but this is not new and is stable. No shortness of breath with routine activities. No orthopnea, PND, edema, palpitations, lightheadedness, dizziness, or syncope. She is compliant with her medications and is tolerating them well. She has questions about about what heart healthy foods she can eat and we discussed this for a while.  Past Medical History:  Diagnosis Date   Allergy    Anemia    Breast cancer (HCC)    Cataract    COPD (chronic obstructive pulmonary disease) (HCC)    Depression    Essential hypertension 11/29/2014   Glaucoma    Hemoptysis    Hypothyroidism    Intractable nausea and vomiting 07/23/2022   Left bundle branch block 03/16/2005   Oxygen deficiency  patient was using oxygen at home, her pulmonologist discontinued it and patient stopped using it on Monday Mar 20, 2015   Personal history of radiation therapy    Restless leg syndrome    Sinusitis    Vertigo     Past Surgical History:  Procedure Laterality Date   ABDOMINAL HYSTERECTOMY     APPENDECTOMY  2009   BIOPSY  07/24/2022   Procedure: BIOPSY;  Surgeon: Beverley Fiedler, MD;  Location: [facility];  Service:  Gastroenterology;;   BREAST CYST ASPIRATION Right 06/12/2016   BREAST LUMPECTOMY Right 02/04/2020   BREAST LUMPECTOMY Left 02/04/2020   BREAST LUMPECTOMY WITH RADIOACTIVE SEED AND SENTINEL LYMPH NODE BIOPSY Bilateral 02/04/2020   Procedure: BILATERAL BREAST LUMPECTOMY WITH RADIOACTIVE SEED , RIGHT X 2, LEFT X 1 AND RIGHT SENTINEL LYMPH NODE MAPPING;  Surgeon: Harriette Bouillon, MD;  Location: [facility] OR;  Service: General;  Laterality: Bilateral;   CORONARY STENT INTERVENTION N/A 10/24/2021   Procedure: CORONARY STENT INTERVENTION;  Surgeon: Swaziland, Peter M, MD;  Location: [facility];  Service: Cardiovascular;  Laterality: N/A;   ESOPHAGOGASTRODUODENOSCOPY N/A 07/24/2022   Procedure: ESOPHAGOGASTRODUODENOSCOPY (EGD);  Surgeon: Beverley Fiedler, MD;  Location: [facility];  Service: Gastroenterology;  Laterality: N/A;   LEFT HEART CATH AND CORONARY ANGIOGRAPHY N/A 10/24/2021   Procedure: LEFT HEART CATH AND CORONARY ANGIOGRAPHY;  Surgeon: Swaziland, Peter M, MD;  Location: [facility]IVE CV LAB;  Service: Cardiovascular;  Laterality: N/A;   RE-EXCISION OF BREAST LUMPECTOMY Bilateral 03/03/2020   Procedure: RE-EXCISION BILATERAL BREAST LUMPECTOMY;  Surgeon: Harriette Bouillon, MD;  Location: [facility];  Service: General;  Laterality: Bilateral;   VESICOVAGINAL FISTULA CLOSURE W/ TAH  1992    Current Medications: Current Meds  Medication Sig   albuterol (VENTOLIN HFA) 108 (90 Base) MCG/ACT inhaler Inhale 2 puffs into the lungs every 6 (six) hours as needed for wheezing or shortness of breath.   azelastine (ASTELIN) 0.1 % nasal spray 1-2 puffs each nostril twice daily if needed (Patient taking differently: Place 1-2 sprays into both nostrils daily as needed for rhinitis.)   benzonatate (TESSALON) 200 MG capsule Take 1 capsule (200 mg total) by mouth 3 (three) times daily as needed for cough.   Cholecalciferol (VITAMIN D) 50 MCG (2000 UT) tablet Take 2,000 Units by mouth daily.   Coenzyme Q10  (COQ-10) 100 MG CAPS Take 100 mg by mouth daily.   dorzolamide (TRUSOPT) 2 % ophthalmic solution Place 1 drop into both eyes 3 (three) times daily.   EUTHYROX 75 MCG tablet Take 75 mcg by mouth every morning.   ferrous sulfate 325 (65 FE) MG tablet Take 325 mg by mouth daily with breakfast.   fluticasone (FLONASE) 50 MCG/ACT nasal spray Place 2 sprays into both nostrils daily. (Patient taking differently: Place 2 sprays into both nostrils daily as needed for allergies.)   Fluticasone-Umeclidin-Vilant (TRELEGY ELLIPTA) 100-62.5-25 MCG/ACT AEPB Inhale 1 puff into the lungs daily. (Patient taking differently: Inhale 1 puff into the lungs daily as needed (sob/wheezing).)   latanoprost (XALATAN) 0.005 % ophthalmic solution Place 1 drop into both eyes at bedtime.   metoprolol succinate (TOPROL-XL) 50 MG 24 hr tablet Take 1 tablet (50 mg total) by mouth daily. Take with or immediately following a meal.   Multiple Minerals (CALCIUM/MAGNESIUM/ZINC) TABS Take 3 tablets by mouth at bedtime.   Multiple Vitamin (MULTIVITAMIN) tablet Take 1 tablet by mouth daily.   pantoprazole (PROTONIX) 40 MG tablet Take 1 tablet (40 mg total) by mouth  2 (two) times daily before a meal.   pyridOXINE (VITAMIN B6) 100 MG tablet Take 100 mg by mouth daily.   venlafaxine XR (EFFEXOR-XR) 37.5 MG 24 hr capsule Take 37.5 mg by mouth daily.   vitamin C (ASCORBIC ACID) 500 MG tablet Take 500 mg by mouth daily.   vitamin E 400 UNIT capsule Take 400 Units by mouth daily.     Allergies:   Augmentin [amoxicillin-pot clavulanate], Black cohosh, Codeine, Hyoscyamine, Oxybutynin chloride, and Minocycline   Social History   Socioeconomic History   Marital status: Single    Spouse name: Not on file   Number of children: Not on file   Years of education: Not on file   Highest education level: Not on file  Occupational History   Occupation: retired    Associate Professor: [employer]  Tobacco Use   Smoking status: Never   Smokeless tobacco:  Never  Vaping Use   Vaping Use: Never used  Substance and Sexual Activity   Alcohol use: No    Alcohol/week: 0.0 standard drinks of alcohol   Drug use: No   Sexual activity: Not on file  Other Topics Concern   Not on file  Social History Narrative   Not on file   Social Determinants of Health   Financial Resource Strain: Not on file  Food Insecurity: No Food Insecurity (07/23/2022)   Hunger Vital Sign    Worried About Running Out of Food in the Last Year: Never true    Ran Out of Food in the Last Year: Never true  Transportation Needs: No Transportation Needs (07/23/2022)   PRAPARE - Administrator, Civil Service (Medical): No    Lack of Transportation (Non-Medical): No  Physical Activity: Not on file  Stress: Not on file  Social Connections: Not on file     Family History: The patient's family history includes Breast cancer in her sister; COPD in her mother; Colon polyps in her mother; Emphysema in her father and mother; Heart disease in her father and mother. There is no history of Colon cancer, Esophageal cancer, Stomach cancer, or Rectal cancer.  ROS:   Please see the history of present illness.     EKGs/Labs/Other Studies Reviewed:    The following studies were reviewed:  Echocardiogram 10/22/2021: Impressions: 1. Septal, apical and inferior apical hypokinesis . Left ventricular  ejection fraction, by estimation, is 45 to 50%. The left ventricle has  mildly decreased function. The left ventricle has no regional wall motion  abnormalities. The left ventricular  internal cavity size was mildly dilated. There is mild left ventricular  hypertrophy. Left ventricular diastolic parameters are consistent with  Grade I diastolic dysfunction (impaired relaxation).   2. Right ventricular systolic function is normal. The right ventricular  size is normal.   3. The mitral valve is abnormal. Trivial mitral valve regurgitation. No  evidence of mitral stenosis. Moderate  mitral annular calcification.   4. The aortic valve is tricuspid. There is mild calcification of the  aortic valve. There is mild thickening of the aortic valve. Aortic valve  regurgitation is mild. Aortic valve sclerosis is present, with no evidence  of aortic valve stenosis.   5. The inferior vena cava is normal in size with greater than 50%  respiratory variability, suggesting right atrial pressure of 3 mmHg.  _______________  Left Cardiac Catheterization 10/24/2021:   1st Diag lesion is 50% stenosed.   Prox LAD to Mid LAD lesion is 95% stenosed.   Mid LAD  to Dist LAD lesion is 40% stenosed.   A drug-eluting stent was successfully placed using a SYNERGY XD 2.50X28.   Balloon angioplasty was performed using a BALLN SAPPHIRE 2.0X12.   Post intervention, there is a 0% residual stenosis.   Post intervention, there is a 0% residual stenosis.   LV end diastolic pressure is normal.   Single vessel obstructive disease with complex bifurcation LAD/first diagonal stenosis Medina class 1,1,1.  Normal LVEDP Successful PCI of the LAD/diagonal bifurcation with DES in the LAD and POBA of the diagonal side branch   Plan: DAPT for one year. Anticipate DC tomorrow if stable.  Diagnostic Dominance: Right  Intervention       EKG: Sinus rhythm left bundle branch block QRS duration 120 ms   Recent Labs: 07/22/2022: Magnesium 2.2; TSH 7.193 07/24/2022: ALT 12; BUN 14; Creatinine, Ser 0.65; Platelets 238; Potassium 4.0; Sodium 140 07/25/2022: Hemoglobin 9.3  Recent Lipid Panel    Component Value Date/Time   CHOL 196 10/22/2021 0609   TRIG 299 (H) 10/22/2021 0609   HDL 55 10/22/2021 0609   CHOLHDL 3.6 10/22/2021 0609   VLDL 60 (H) 10/22/2021 0609   LDLCALC 81 10/22/2021 0609    Physical Exam:    Vital Signs: BP 134/64 (BP Location: Left Arm, Patient Position: Sitting, Cuff Size: Normal)   Pulse 94   Ht 5\' 7"  (1.702 m)   Wt 127 lb 9.6 oz (57.9 kg)   SpO2 94%   BMI 19.98 kg/m     Wt  Readings from Last 3 Encounters:  07/31/22 127 lb 9.6 oz (57.9 kg)  07/22/22 133 lb (60.3 kg)  01/24/22 133 lb 12.8 oz (60.7 kg)     Constitutional: No acute distress Eyes: sclera non-icteric, normal conjunctiva and lids ENMT: normal dentition, moist mucous membranes Cardiovascular: regular rhythm, normal rate, no murmur. S1 and S2 normal. No jugular venous distention.  Respiratory: clear to auscultation bilaterally GI : normal bowel sounds, soft and nontender. No distention.   MSK: extremities warm, well perfused. No edema.  NEURO: grossly nonfocal exam, moves all extremities. PSYCH: alert and oriented x 3, normal mood and affect.    Assessment:    1. Coronary artery disease involving native coronary artery of native heart without angina pectoris   2. Ischemic cardiomyopathy   3. Hypertension, unspecified type   4. Hyperlipidemia, unspecified hyperlipidemia type   5. NSTEMI (non-ST elevated myocardial infarction) (HCC)   6. Status post coronary artery stent placement   7. Duodenal ulcer hemorrhage      Plan:    CAD  Ischemic Cardiomyopathy GI bleed Patient with NSTEMI 10/24/21 s/p DES to LAD and POBA to 1st Diag. LVEF of 45-50% with septal, apical, and inferior apical hypokinesis and grade 1 diastolic dysfunction as well as moderate MAC and aortic valve sclerosis without evidence of AS. - No recurrent chest pain. -Given GI bleed, will continue Plavix and monotherapy starting back tomorrow.  Aspirin on hold per GI, will reassess at follow-up but unlikely to resume given concern for NSAID related bleeding and end of 26-month period of DAPT at that time. - Continue Toprol-XL 50mg  daily, patient has stopped losartan. - Continue Lipitor 80mg  daily. -It also looks like atorvastatin was discontinued while in hospital, unclear reason why, would recommend resuming.  Hypertension BP well controlled. - Contineu Toprol-XL.  Hyperlipidemia Lipid panel on 10/22/2021 during recent  admission: Total Cholesterol 196, Triglycerides 299, HDL 55, LDL 81. Lipoprotein A 23.5. LDL goal <70 given CAD. - Continue Lipitor  80mg  daily.  -Needs repeat lipids, she can get these with her primary care physician on Friday at her upcoming appointment.   Medication Adjustments/Labs and Tests Ordered: Current medicines are reviewed at length with the patient today.  Concerns regarding medicines are outlined above.  Orders Placed This Encounter  Procedures   EKG 12-Lead   ECHOCARDIOGRAM COMPLETE   No orders of the defined types were placed in this encounter.   Patient Instructions  Medication Instructions:  RESTART YOUR PLAVIX (CLOPIDOGREL) TOMORROW. OKAY TO REMAIN OFF OF ASPIRIN PER GI *If you need a refill on your cardiac medications before your next appointment, please call your pharmacy*  Testing/Procedures: Your physician has requested that you have an echocardiogram. Echocardiography is a painless test that uses sound waves to create images of your heart. It provides your doctor with information about the size and shape of your heart and how well your heart's chambers and valves are working. You may receive an ultrasound enhancing agent through an IV if needed to better visualize your heart during the echo.This procedure takes approximately one hour. There are no restrictions for this procedure. This will take place at the [address].   Follow-Up: At [facility], you and your health needs are our priority.  As part of our continuing mission to provide you with exceptional heart care, we have created designated Provider Care Teams.  These Care Teams include your primary Cardiologist (physician) and Advanced Practice Providers (APPs -  Physician Assistants and Nurse Practitioners) who all work together to provide you with the care you need, when you need it.  Your next appointment:   3 month(s)  Provider:   Parke Poisson, MD

## 2022-07-31 NOTE — Patient Instructions (Signed)
Medication Instructions:  RESTART YOUR PLAVIX (CLOPIDOGREL) TOMORROW. OKAY TO REMAIN OFF OF ASPIRIN PER GI *If you need a refill on your cardiac medications before your next appointment, please call your pharmacy*  Testing/Procedures: Your physician has requested that you have an echocardiogram. Echocardiography is a painless test that uses sound waves to create images of your heart. It provides your doctor with information about the size and shape of your heart and how well your heart's chambers and valves are working. You may receive an ultrasound enhancing agent through an IV if needed to better visualize your heart during the echo.This procedure takes approximately one hour. There are no restrictions for this procedure. This will take place at the [address].   Follow-Up: At [facility], you and your health needs are our priority.  As part of our continuing mission to provide you with exceptional heart care, we have created designated Provider Care Teams.  These Care Teams include your primary Cardiologist (physician) and Advanced Practice Providers (APPs -  Physician Assistants and Nurse Practitioners) who all work together to provide you with the care you need, when you need it.  Your next appointment:   3 month(s)  Provider:   Parke Poisson, MD

## 2022-08-03 DIAGNOSIS — I2510 Atherosclerotic heart disease of native coronary artery without angina pectoris: Secondary | ICD-10-CM | POA: Diagnosis not present

## 2022-08-03 DIAGNOSIS — D500 Iron deficiency anemia secondary to blood loss (chronic): Secondary | ICD-10-CM | POA: Diagnosis not present

## 2022-08-03 DIAGNOSIS — K254 Chronic or unspecified gastric ulcer with hemorrhage: Secondary | ICD-10-CM | POA: Diagnosis not present

## 2022-08-03 DIAGNOSIS — K264 Chronic or unspecified duodenal ulcer with hemorrhage: Secondary | ICD-10-CM | POA: Diagnosis not present

## 2022-08-03 DIAGNOSIS — I25119 Atherosclerotic heart disease of native coronary artery with unspecified angina pectoris: Secondary | ICD-10-CM | POA: Diagnosis not present

## 2022-08-03 DIAGNOSIS — E46 Unspecified protein-calorie malnutrition: Secondary | ICD-10-CM | POA: Diagnosis not present

## 2022-08-03 LAB — LAB REPORT - SCANNED: EGFR: 49

## 2022-08-07 ENCOUNTER — Telehealth: Payer: Self-pay | Admitting: *Deleted

## 2022-08-07 ENCOUNTER — Ambulatory Visit (HOSPITAL_BASED_OUTPATIENT_CLINIC_OR_DEPARTMENT_OTHER): Payer: Medicare HMO

## 2022-08-07 DIAGNOSIS — K2900 Acute gastritis without bleeding: Secondary | ICD-10-CM | POA: Diagnosis not present

## 2022-08-07 DIAGNOSIS — G2581 Restless legs syndrome: Secondary | ICD-10-CM | POA: Diagnosis not present

## 2022-08-07 DIAGNOSIS — I252 Old myocardial infarction: Secondary | ICD-10-CM | POA: Diagnosis not present

## 2022-08-07 DIAGNOSIS — I700 Atherosclerosis of aorta: Secondary | ICD-10-CM | POA: Diagnosis not present

## 2022-08-07 DIAGNOSIS — F32A Depression, unspecified: Secondary | ICD-10-CM | POA: Diagnosis not present

## 2022-08-07 DIAGNOSIS — I2510 Atherosclerotic heart disease of native coronary artery without angina pectoris: Secondary | ICD-10-CM | POA: Insufficient documentation

## 2022-08-07 DIAGNOSIS — K920 Hematemesis: Secondary | ICD-10-CM | POA: Diagnosis not present

## 2022-08-07 DIAGNOSIS — R1084 Generalized abdominal pain: Secondary | ICD-10-CM | POA: Diagnosis not present

## 2022-08-07 DIAGNOSIS — I447 Left bundle-branch block, unspecified: Secondary | ICD-10-CM | POA: Diagnosis not present

## 2022-08-07 DIAGNOSIS — Z881 Allergy status to other antibiotic agents status: Secondary | ICD-10-CM | POA: Diagnosis not present

## 2022-08-07 DIAGNOSIS — I959 Hypotension, unspecified: Secondary | ICD-10-CM | POA: Diagnosis not present

## 2022-08-07 DIAGNOSIS — Z79899 Other long term (current) drug therapy: Secondary | ICD-10-CM | POA: Diagnosis not present

## 2022-08-07 DIAGNOSIS — D6489 Other specified anemias: Secondary | ICD-10-CM | POA: Diagnosis present

## 2022-08-07 DIAGNOSIS — E785 Hyperlipidemia, unspecified: Secondary | ICD-10-CM | POA: Diagnosis present

## 2022-08-07 DIAGNOSIS — D649 Anemia, unspecified: Secondary | ICD-10-CM | POA: Diagnosis not present

## 2022-08-07 DIAGNOSIS — Z79890 Hormone replacement therapy (postmenopausal): Secondary | ICD-10-CM | POA: Diagnosis not present

## 2022-08-07 DIAGNOSIS — H409 Unspecified glaucoma: Secondary | ICD-10-CM | POA: Diagnosis present

## 2022-08-07 DIAGNOSIS — K922 Gastrointestinal hemorrhage, unspecified: Secondary | ICD-10-CM | POA: Diagnosis not present

## 2022-08-07 DIAGNOSIS — I255 Ischemic cardiomyopathy: Secondary | ICD-10-CM

## 2022-08-07 DIAGNOSIS — E876 Hypokalemia: Secondary | ICD-10-CM | POA: Diagnosis not present

## 2022-08-07 DIAGNOSIS — I10 Essential (primary) hypertension: Secondary | ICD-10-CM | POA: Diagnosis not present

## 2022-08-07 DIAGNOSIS — K5730 Diverticulosis of large intestine without perforation or abscess without bleeding: Secondary | ICD-10-CM | POA: Diagnosis not present

## 2022-08-07 DIAGNOSIS — Z7902 Long term (current) use of antithrombotics/antiplatelets: Secondary | ICD-10-CM | POA: Diagnosis not present

## 2022-08-07 DIAGNOSIS — I110 Hypertensive heart disease with heart failure: Secondary | ICD-10-CM | POA: Diagnosis not present

## 2022-08-07 DIAGNOSIS — K8020 Calculus of gallbladder without cholecystitis without obstruction: Secondary | ICD-10-CM | POA: Diagnosis not present

## 2022-08-07 DIAGNOSIS — K2980 Duodenitis without bleeding: Secondary | ICD-10-CM | POA: Diagnosis not present

## 2022-08-07 DIAGNOSIS — Z7951 Long term (current) use of inhaled steroids: Secondary | ICD-10-CM | POA: Diagnosis not present

## 2022-08-07 DIAGNOSIS — R1013 Epigastric pain: Secondary | ICD-10-CM | POA: Diagnosis not present

## 2022-08-07 DIAGNOSIS — E871 Hypo-osmolality and hyponatremia: Secondary | ICD-10-CM | POA: Diagnosis not present

## 2022-08-07 DIAGNOSIS — I5022 Chronic systolic (congestive) heart failure: Secondary | ICD-10-CM | POA: Diagnosis not present

## 2022-08-07 DIAGNOSIS — E039 Hypothyroidism, unspecified: Secondary | ICD-10-CM | POA: Diagnosis not present

## 2022-08-07 DIAGNOSIS — N390 Urinary tract infection, site not specified: Secondary | ICD-10-CM | POA: Diagnosis not present

## 2022-08-07 DIAGNOSIS — R109 Unspecified abdominal pain: Secondary | ICD-10-CM | POA: Diagnosis not present

## 2022-08-07 DIAGNOSIS — J449 Chronic obstructive pulmonary disease, unspecified: Secondary | ICD-10-CM | POA: Diagnosis not present

## 2022-08-07 DIAGNOSIS — I491 Atrial premature depolarization: Secondary | ICD-10-CM | POA: Diagnosis not present

## 2022-08-07 DIAGNOSIS — K253 Acute gastric ulcer without hemorrhage or perforation: Secondary | ICD-10-CM | POA: Diagnosis not present

## 2022-08-07 LAB — ECHOCARDIOGRAM COMPLETE
Area-P 1/2: 3.65 cm2
P 1/2 time: 435 msec
S' Lateral: 3.1 cm

## 2022-08-07 NOTE — Telephone Encounter (Signed)
Pt here for 2 D Echo today.  Received call from echo tech reporting pt c/o SOB and CP.  Observed pt in echo room.  BO 133/72 HR 77 which is close to her last documented normal.  Pt reports having a dull soreness in her chest where the probe was rubbing on her chest.  She reports it hurts but she doesn't have pain.  She states when she think of pain, she thinks of it as sharp and this is not.  She denies it being like previous chest pain that sent her to the hospital.  She is tearful and reports she had to drive herself today because her son was too busy.  She demonstrates anxiety when not being spoken to AEB increase in respirations and tearfulness.  Breathing is normal during conversation.  Attempted to contact son x 2 with no answer.  Pt had c/o nausea earlier and tech brought her ginger ale and saltines.  Pt reports this has helped her.  Pt walked to lobby and going to sit there for a few minutes before trying to head home.  Advised pt to ask for assistance at front desk if she should stay to feel poorly again.  Advised registration staff of situation.

## 2022-08-09 ENCOUNTER — Other Ambulatory Visit: Payer: Self-pay

## 2022-08-09 ENCOUNTER — Emergency Department (HOSPITAL_COMMUNITY): Payer: Medicare HMO

## 2022-08-09 ENCOUNTER — Encounter (HOSPITAL_COMMUNITY): Payer: Self-pay

## 2022-08-09 ENCOUNTER — Inpatient Hospital Stay (HOSPITAL_COMMUNITY)
Admission: EM | Admit: 2022-08-09 | Discharge: 2022-08-11 | DRG: 392 | Disposition: A | Discharge status: Home / Self Care | Payer: Medicare HMO | Attending: Internal Medicine | Admitting: Internal Medicine

## 2022-08-09 DIAGNOSIS — Z803 Family history of malignant neoplasm of breast: Secondary | ICD-10-CM

## 2022-08-09 DIAGNOSIS — E039 Hypothyroidism, unspecified: Secondary | ICD-10-CM | POA: Diagnosis not present

## 2022-08-09 DIAGNOSIS — E876 Hypokalemia: Secondary | ICD-10-CM | POA: Diagnosis present

## 2022-08-09 DIAGNOSIS — K2900 Acute gastritis without bleeding: Secondary | ICD-10-CM | POA: Diagnosis present

## 2022-08-09 DIAGNOSIS — K2980 Duodenitis without bleeding: Principal | ICD-10-CM | POA: Diagnosis present

## 2022-08-09 DIAGNOSIS — I447 Left bundle-branch block, unspecified: Secondary | ICD-10-CM | POA: Diagnosis present

## 2022-08-09 DIAGNOSIS — E871 Hypo-osmolality and hyponatremia: Secondary | ICD-10-CM | POA: Diagnosis present

## 2022-08-09 DIAGNOSIS — I110 Hypertensive heart disease with heart failure: Secondary | ICD-10-CM | POA: Diagnosis present

## 2022-08-09 DIAGNOSIS — Z79899 Other long term (current) drug therapy: Secondary | ICD-10-CM

## 2022-08-09 DIAGNOSIS — Z881 Allergy status to other antibiotic agents status: Secondary | ICD-10-CM

## 2022-08-09 DIAGNOSIS — Z7951 Long term (current) use of inhaled steroids: Secondary | ICD-10-CM

## 2022-08-09 DIAGNOSIS — I2510 Atherosclerotic heart disease of native coronary artery without angina pectoris: Secondary | ICD-10-CM | POA: Diagnosis present

## 2022-08-09 DIAGNOSIS — Z955 Presence of coronary angioplasty implant and graft: Secondary | ICD-10-CM

## 2022-08-09 DIAGNOSIS — F32A Depression, unspecified: Secondary | ICD-10-CM | POA: Diagnosis present

## 2022-08-09 DIAGNOSIS — N390 Urinary tract infection, site not specified: Secondary | ICD-10-CM | POA: Diagnosis not present

## 2022-08-09 DIAGNOSIS — I10 Essential (primary) hypertension: Secondary | ICD-10-CM | POA: Diagnosis present

## 2022-08-09 DIAGNOSIS — Z825 Family history of asthma and other chronic lower respiratory diseases: Secondary | ICD-10-CM

## 2022-08-09 DIAGNOSIS — J449 Chronic obstructive pulmonary disease, unspecified: Secondary | ICD-10-CM | POA: Diagnosis present

## 2022-08-09 DIAGNOSIS — Z923 Personal history of irradiation: Secondary | ICD-10-CM

## 2022-08-09 DIAGNOSIS — G2581 Restless legs syndrome: Secondary | ICD-10-CM | POA: Diagnosis present

## 2022-08-09 DIAGNOSIS — D6489 Other specified anemias: Secondary | ICD-10-CM | POA: Diagnosis present

## 2022-08-09 DIAGNOSIS — Z885 Allergy status to narcotic agent status: Secondary | ICD-10-CM

## 2022-08-09 DIAGNOSIS — I255 Ischemic cardiomyopathy: Secondary | ICD-10-CM | POA: Diagnosis present

## 2022-08-09 DIAGNOSIS — K253 Acute gastric ulcer without hemorrhage or perforation: Principal | ICD-10-CM

## 2022-08-09 DIAGNOSIS — Z853 Personal history of malignant neoplasm of breast: Secondary | ICD-10-CM

## 2022-08-09 DIAGNOSIS — Z888 Allergy status to other drugs, medicaments and biological substances status: Secondary | ICD-10-CM

## 2022-08-09 DIAGNOSIS — I700 Atherosclerosis of aorta: Secondary | ICD-10-CM | POA: Diagnosis present

## 2022-08-09 DIAGNOSIS — E785 Hyperlipidemia, unspecified: Secondary | ICD-10-CM | POA: Diagnosis present

## 2022-08-09 DIAGNOSIS — H409 Unspecified glaucoma: Secondary | ICD-10-CM | POA: Diagnosis present

## 2022-08-09 DIAGNOSIS — I252 Old myocardial infarction: Secondary | ICD-10-CM

## 2022-08-09 DIAGNOSIS — R1013 Epigastric pain: Secondary | ICD-10-CM

## 2022-08-09 DIAGNOSIS — Z7902 Long term (current) use of antithrombotics/antiplatelets: Secondary | ICD-10-CM

## 2022-08-09 DIAGNOSIS — I5022 Chronic systolic (congestive) heart failure: Secondary | ICD-10-CM | POA: Diagnosis present

## 2022-08-09 DIAGNOSIS — Z79890 Hormone replacement therapy (postmenopausal): Secondary | ICD-10-CM

## 2022-08-09 DIAGNOSIS — Z8249 Family history of ischemic heart disease and other diseases of the circulatory system: Secondary | ICD-10-CM

## 2022-08-09 DIAGNOSIS — Z9049 Acquired absence of other specified parts of digestive tract: Secondary | ICD-10-CM

## 2022-08-09 LAB — COMPREHENSIVE METABOLIC PANEL
ALT: 13 U/L (ref 0–44)
AST: 17 U/L (ref 15–41)
Albumin: 3 g/dL — ABNORMAL LOW (ref 3.5–5.0)
Alkaline Phosphatase: 88 U/L (ref 38–126)
Anion gap: 10 (ref 5–15)
BUN: 15 mg/dL (ref 8–23)
CO2: 23 mmol/L (ref 22–32)
Calcium: 8.5 mg/dL — ABNORMAL LOW (ref 8.9–10.3)
Chloride: 101 mmol/L (ref 98–111)
Creatinine, Ser: 0.95 mg/dL (ref 0.44–1.00)
GFR, Estimated: 59 mL/min — ABNORMAL LOW (ref >60)
Glucose, Bld: 107 mg/dL — ABNORMAL HIGH (ref 70–99)
Potassium: 3.3 mmol/L — ABNORMAL LOW (ref 3.5–5.1)
Sodium: 134 mmol/L — ABNORMAL LOW (ref 135–145)
Total Bilirubin: 0.3 mg/dL (ref 0.3–1.2)
Total Protein: 6.4 g/dL — ABNORMAL LOW (ref 6.5–8.1)

## 2022-08-09 LAB — URINALYSIS, ROUTINE W REFLEX MICROSCOPIC
Bilirubin Urine: NEGATIVE
Glucose, UA: NEGATIVE mg/dL
Ketones, ur: NEGATIVE mg/dL
Nitrite: POSITIVE — AB
Protein, ur: 30 mg/dL — AB
Specific Gravity, Urine: 1.017 (ref 1.005–1.030)
WBC, UA: >50 WBC/hpf (ref 0–5)
pH: 5 (ref 5.0–8.0)

## 2022-08-09 LAB — CBC WITH DIFFERENTIAL/PLATELET
Abs Immature Granulocytes: 0.03 10*3/uL (ref 0.00–0.07)
Basophils Absolute: 0.1 10*3/uL (ref 0.0–0.1)
Basophils Relative: 1 %
Eosinophils Absolute: 0.2 10*3/uL (ref 0.0–0.5)
Eosinophils Relative: 2 %
HCT: 29.6 % — ABNORMAL LOW (ref 36.0–46.0)
Hemoglobin: 9.3 g/dL — ABNORMAL LOW (ref 12.0–15.0)
Immature Granulocytes: 0 %
Lymphocytes Relative: 12 %
Lymphs Abs: 1.1 10*3/uL (ref 0.7–4.0)
MCH: 30.1 pg (ref 26.0–34.0)
MCHC: 31.4 g/dL (ref 30.0–36.0)
MCV: 95.8 fL (ref 80.0–100.0)
Monocytes Absolute: 0.6 10*3/uL (ref 0.1–1.0)
Monocytes Relative: 7 %
Neutro Abs: 7.5 10*3/uL (ref 1.7–7.7)
Neutrophils Relative %: 78 %
Platelets: 322 10*3/uL (ref 150–400)
RBC: 3.09 MIL/uL — ABNORMAL LOW (ref 3.87–5.11)
RDW: 14.8 % (ref 11.5–15.5)
WBC: 9.4 10*3/uL (ref 4.0–10.5)
nRBC: 0 % (ref 0.0–0.2)

## 2022-08-09 LAB — LIPASE, BLOOD: Lipase: 265 U/L — ABNORMAL HIGH (ref 11–51)

## 2022-08-09 MED ORDER — PANTOPRAZOLE INFUSION (NEW) - SIMPLE MED
8.0000 mg/h | INTRAVENOUS | Status: DC
  Administered 2022-08-10 (×3): 8 mg/h via INTRAVENOUS
  Filled 2022-08-09 (×5): qty 100

## 2022-08-09 MED ORDER — ACETAMINOPHEN 650 MG RE SUPP: 650.0000 mg | Freq: Four times a day (QID) | RECTAL | Status: DC | PRN

## 2022-08-09 MED ORDER — IOHEXOL 350 MG/ML SOLN
75.0000 mL | Freq: Once | INTRAVENOUS | Status: AC | PRN
  Administered 2022-08-09: 75 mL via INTRAVENOUS

## 2022-08-09 MED ORDER — MAGNESIUM HYDROXIDE 400 MG/5ML PO SUSP: 30.0000 mL | Freq: Every day | ORAL | Status: DC | PRN

## 2022-08-09 MED ORDER — TRAZODONE HCL 50 MG PO TABS
25.0000 mg | ORAL_TABLET | Freq: Every evening | ORAL | Status: DC | PRN
  Administered 2022-08-10: 25 mg via ORAL
  Filled 2022-08-09: qty 1

## 2022-08-09 MED ORDER — SODIUM CHLORIDE 0.9 % IV SOLN: INTRAVENOUS | Status: DC

## 2022-08-09 MED ORDER — ACETAMINOPHEN 325 MG PO TABS: 650.0000 mg | ORAL_TABLET | Freq: Four times a day (QID) | ORAL | Status: DC | PRN

## 2022-08-09 MED ORDER — SODIUM CHLORIDE 0.9 % IV BOLUS
1000.0000 mL | Freq: Once | INTRAVENOUS | Status: AC
  Administered 2022-08-09: 1000 mL via INTRAVENOUS

## 2022-08-09 MED ORDER — FAMOTIDINE IN NACL 20-0.9 MG/50ML-% IV SOLN
20.0000 mg | Freq: Once | INTRAVENOUS | Status: AC
  Administered 2022-08-09: 20 mg via INTRAVENOUS
  Filled 2022-08-09: qty 50

## 2022-08-09 MED ORDER — ONDANSETRON HCL 4 MG PO TABS: 4.0000 mg | ORAL_TABLET | Freq: Four times a day (QID) | ORAL | Status: DC | PRN

## 2022-08-09 MED ORDER — ONDANSETRON HCL 4 MG/2ML IJ SOLN: 4.0000 mg | Freq: Four times a day (QID) | INTRAMUSCULAR | Status: DC | PRN

## 2022-08-09 MED ORDER — ALUM & MAG HYDROXIDE-SIMETH 200-200-20 MG/5ML PO SUSP
30.0000 mL | Freq: Once | ORAL | Status: AC
  Administered 2022-08-09: 30 mL via ORAL
  Filled 2022-08-09: qty 30

## 2022-08-09 MED ORDER — PANTOPRAZOLE 80MG IVPB - SIMPLE MED
80.0000 mg | Freq: Once | INTRAVENOUS | Status: AC
  Administered 2022-08-10: 80 mg via INTRAVENOUS
  Filled 2022-08-09: qty 100

## 2022-08-09 NOTE — ED Provider Notes (Signed)
[place] [facility]ENC[facility] DEPARTMENT AT [facility] Provider Note   CSN: [id number] Arrival date & time: 08/09/22  1934     History  Chief Complaint  Patient presents with   Abdominal Pain    Arrived via ems from home c/o upper abdominal discomfort x 2 hours denies n/v/d ems vitals 150/90 90 18 95% ra cbg 116 given NS bolus    [name] is a 82 y.o. female.  Pt is a 82 yo female with pmhx significant for depression, CAD s/p stent, RLS, hypothyroidism, copd, htn, glaucoma, cataracts, hx r breast cancer, and ulcers.  Pt was admitted from 6/2-6/5 for duodenal ulcer hemorrhage (although she could not tell me why she was admitted).  She was told to stop her mobic, take protonix bid, hold asa and f/u with pcp.  She did see cards on 6/11 and was told to restart plavix on 6/12.  She denies any new bleeding or dark stool.  She noticed the pain tonight.  She said it feels like an achy sensation similar to what she had when she was admitted.  She denies any n/v.         Home Medications Prior to Admission medications   Medication Sig Start Date End Date Taking? Authorizing Provider  albuterol (VENTOLIN HFA) 108 (90 Base) MCG/ACT inhaler Inhale 2 puffs into the lungs every 6 (six) hours as needed for wheezing or shortness of breath. 11/27/18  Yes Young, Joni Fears D, MD  azelastine (ASTELIN) 0.1 % nasal spray 1-2 puffs each nostril twice daily if needed Patient taking differently: Place 1-2 sprays into both nostrils daily as needed for rhinitis. 11/26/17  Yes Young, Joni Fears D, MD  benzonatate (TESSALON) 200 MG capsule Take 1 capsule (200 mg total) by mouth 3 (three) times daily as needed for cough. 05/27/20  Yes Young, Joni Fears D, MD  Cholecalciferol (VITAMIN D) 50 MCG (2000 UT) tablet Take 2,000 Units by mouth daily.   Yes [provider]  clopidogrel (PLAVIX) 75 MG tablet Take 1 tablet (75 mg total) by mouth daily with breakfast. 05/11/22  Yes Parke Poisson, MD   Coenzyme Q10 (COQ-10) 100 MG CAPS Take 100 mg by mouth daily.   Yes [provider]  dorzolamide (TRUSOPT) 2 % ophthalmic solution Place 1 drop into both eyes at bedtime.   Yes [provider]  EUTHYROX 75 MCG tablet Take 75 mcg by mouth every morning. 10/11/21  Yes [provider]  fluticasone (FLONASE) 50 MCG/ACT nasal spray Place 2 sprays into both nostrils daily. Patient taking differently: Place 2 sprays into both nostrils daily as needed for allergies. 07/29/13  Yes Ginnie Smart, MD  Fluticasone-Umeclidin-Vilant (TRELEGY ELLIPTA) 100-62.5-25 MCG/ACT AEPB Inhale 1 puff into the lungs daily. Patient taking differently: Inhale 1 puff into the lungs daily as needed (sob/wheezing). 02/16/21  Yes Young, Joni Fears D, MD  metoprolol succinate (TOPROL-XL) 50 MG 24 hr tablet Take 1 tablet (50 mg total) by mouth daily. Take with or immediately following a meal. 06/19/22  Yes Goodrich, Carmie Kanner E, PA-C  Multiple Minerals (CALCIUM/MAGNESIUM/ZINC) TABS Take 3 tablets by mouth at bedtime.   Yes [provider]  Multiple Vitamin (MULTIVITAMIN) tablet Take 1 tablet by mouth daily.   Yes [provider]  pantoprazole (PROTONIX) 40 MG tablet Take 1 tablet (40 mg total) by mouth 2 (two) times daily before a meal. Patient taking differently: Take 40 mg by mouth daily as needed (heartburn). 07/25/22  Yes Jonah Blue, MD  pyridOXINE (  VITAMIN B6) 100 MG tablet Take 100 mg by mouth daily.   Yes [provider]  venlafaxine XR (EFFEXOR-XR) 37.5 MG 24 hr capsule Take 37.5 mg by mouth daily. 09/28/21  Yes [provider]  vitamin C (ASCORBIC ACID) 500 MG tablet Take 500 mg by mouth daily.   Yes [provider]  vitamin E 400 UNIT capsule Take 400 Units by mouth daily.   Yes [provider]  latanoprost (XALATAN) 0.005 % ophthalmic solution Place 1 drop into both eyes at bedtime. Patient not taking: Reported on 08/09/2022    [provider]      Allergies    Augmentin [amoxicillin-pot clavulanate], Black cohosh, Codeine, Hyoscyamine, Oxybutynin chloride, and Minocycline    Review of Systems   Review of Systems  Gastrointestinal:  Positive for abdominal pain.  All other systems reviewed and are negative.   Physical Exam Updated Vital Signs BP 135/63   Pulse 68   Temp 98.4 F (36.9 C) (Oral)   Resp 16   Ht 5\' 7"  (1.702 m)   Wt 56.7 kg   SpO2 100%   BMI 19.58 kg/m  Physical Exam Vitals and nursing note reviewed.  Constitutional:      Appearance: She is well-developed.  HENT:     Head: Normocephalic and atraumatic.     Mouth/Throat:     Mouth: Mucous membranes are moist.     Pharynx: Oropharynx is clear.  Eyes:     Extraocular Movements: Extraocular movements intact.     Pupils: Pupils are equal, round, and reactive to light.  Cardiovascular:     Rate and Rhythm: Normal rate and regular rhythm.     Heart sounds: Normal heart sounds.  Pulmonary:     Effort: Pulmonary effort is normal.     Breath sounds: Normal breath sounds.  Abdominal:     General: Abdomen is flat. Bowel sounds are normal.     Palpations: Abdomen is soft.     Tenderness: There is abdominal tenderness in the epigastric area.  Skin:    General: Skin is warm.     Capillary Refill: Capillary refill takes less than 2 seconds.  Neurological:     General: No focal deficit present.     Mental Status: She is alert and oriented to person, place, and time.  Psychiatric:        Mood and Affect: Mood normal.        Behavior: Behavior normal.     ED Results / Procedures / Treatments   Labs (all labs ordered are listed, but only abnormal results are displayed) Labs Reviewed  CBC WITH DIFFERENTIAL/PLATELET - Abnormal; Notable for the following components:      Result Value   RBC 3.09 (*)    Hemoglobin 9.3 (*)    HCT 29.6 (*)    All other components within normal limits  COMPREHENSIVE METABOLIC PANEL - Abnormal; Notable for  the following components:   Sodium 134 (*)    Potassium 3.3 (*)    Glucose, Bld 107 (*)    Calcium 8.5 (*)    Total Protein 6.4 (*)    Albumin 3.0 (*)    GFR, Estimated 59 (*)    All other components within normal limits  LIPASE, BLOOD - Abnormal; Notable for the following components:   Lipase 265 (*)    All other components within normal limits  URINALYSIS, ROUTINE W REFLEX MICROSCOPIC - Abnormal; Notable for the following components:   Color, Urine AMBER (*)  APPearance CLOUDY (*)    Hgb urine dipstick SMALL (*)    Protein, ur 30 (*)    Nitrite POSITIVE (*)    Leukocytes,Ua MODERATE (*)    Bacteria, UA MANY (*)    Non Squamous Epithelial 0-5 (*)    All other components within normal limits  BASIC METABOLIC PANEL  CBC    EKG None  Radiology CT ABDOMEN PELVIS W CONTRAST  Result Date: 08/09/2022 CLINICAL DATA:  Generalized upper abdominal discomfort. EXAM: CT ABDOMEN AND PELVIS WITH CONTRAST TECHNIQUE: Multidetector CT imaging of the abdomen and pelvis was performed using the standard protocol following bolus administration of intravenous contrast. RADIATION DOSE REDUCTION: This exam was performed according to the departmental dose-optimization program which includes automated exposure control, adjustment of the mA and/or kV according to patient size and/or use of iterative reconstruction technique. CONTRAST:  75mL OMNIPAQUE IOHEXOL 350 MG/ML SOLN COMPARISON:  12/09/2007. FINDINGS: Lower chest: Emphysematous changes are noted at the lung bases. There tree-in-bud nodular opacities and bronchiectasis in the left lower lobe, likely infectious or inflammatory. Hepatobiliary: No focal liver abnormality is seen. Stones are present within the gallbladder. No biliary ductal dilatation. Pancreas: A 1.4 cm hypodensity is noted in the tail of the pancreas, axial image 23. No biliary ductal dilatation or surrounding fat stranding. Spleen: Normal in size without focal abnormality.  Adrenals/Urinary Tract: The adrenal glands are within normal limits. The kidneys enhance symmetrically. No renal calculus or hydronephrosis. The bladder is unremarkable. Stomach/Bowel: There is gastric wall thickening with surrounding fat stranding and edema, most pronounced at the gastric antrum with possible ulcerations. There is mild thickening of the walls of the proximal duodenum with associated fat stranding. No bowel obstruction, free air, or pneumatosis. A few scattered diverticula are noted along the colon without evidence of diverticulitis. The appendix is not seen. Vascular/Lymphatic: Aortic atherosclerosis. No enlarged abdominal or pelvic lymph nodes. Reproductive: Status post hysterectomy. No adnexal masses. Other: No abdominopelvic ascites. A small fat containing umbilical hernia is present. Musculoskeletal: Degenerative changes are present in the thoracolumbar spine. No acute osseous abnormality is seen. IMPRESSION: 1. Gastric wall thickening with surrounding fat stranding and edema, most pronounced at the gastric antrum with possible ulcerations. There is also thickening of the walls of the proximal duodenum. Findings are suggestive of gastritis/duodenitis. Endoscopy is recommended for further evaluation on follow-up. 2. Cholelithiasis. 3. Diverticulosis without diverticulitis. 4. Tree-in-bud nodular densities in the left lower lobe, likely infectious or inflammatory. 5. Emphysema. 6. Aortic atherosclerosis. Electronically Signed   By: Thornell Sartorius M.D.   On: 08/09/2022 22:27    Procedures Procedures    Medications Ordered in ED Medications  pantoprazole (PROTONIX) 80 mg /NS 100 mL IVPB (has no administration in time range)  pantoprozole (PROTONIX) 80 mg /NS 100 mL infusion (has no administration in time range)  0.9 %  sodium chloride infusion (has no administration in time range)  acetaminophen (TYLENOL) tablet 650 mg (has no administration in time range)    Or  acetaminophen (TYLENOL)  suppository 650 mg (has no administration in time range)  traZODone (DESYREL) tablet 25 mg (has no administration in time range)  ondansetron (ZOFRAN) tablet 4 mg (has no administration in time range)    Or  ondansetron (ZOFRAN) injection 4 mg (has no administration in time range)  magnesium hydroxide (MILK OF MAGNESIA) suspension 30 mL (has no administration in time range)  sodium chloride 0.9 % bolus 1,000 mL (0 mLs Intravenous Stopped 08/09/22 2112)  famotidine (PEPCID) IVPB 20 mg  premix (0 mg Intravenous Stopped 08/09/22 2045)  alum & mag hydroxide-simeth (MAALOX/MYLANTA) 200-200-20 MG/5ML suspension 30 mL (30 mLs Oral Given 08/09/22 2018)  iohexol (OMNIPAQUE) 350 MG/ML injection 75 mL (75 mLs Intravenous Contrast Given 08/09/22 2209)    ED Course/ Medical Decision Making/ A&P                             Medical Decision Making Amount and/or Complexity of Data Reviewed Labs: ordered. Radiology: ordered.  Risk OTC drugs. Prescription drug management. Decision regarding hospitalization.   This patient presents to the ED for concern of abd pain, this involves an extensive number of treatment options, and is a complaint that carries with it a high risk of complications and morbidity.  The differential diagnosis includes ulcer, infection   Co morbidities that complicate the patient evaluation  depression, CAD s/p stent, RLS, hypothyroidism, copd, htn, glaucoma, cataracts, hx r breast cancer, and ulcers   Additional history obtained:  Additional history obtained from epic chart review External records from outside source obtained and reviewed including EMS report   Lab Tests:  I Ordered, and personally interpreted labs.  The pertinent results include:  cbc with hgb 9.3 (chronic), cmp nl, lip 265   Imaging Studies ordered:  I ordered imaging studies including ct abd/pelvis  I independently visualized and interpreted imaging which showed  . Gastric wall thickening with  surrounding fat stranding and edema,  most pronounced at the gastric antrum with possible ulcerations.  There is also thickening of the walls of the proximal duodenum.  Findings are suggestive of gastritis/duodenitis. Endoscopy is  recommended for further evaluation on follow-up.  2. Cholelithiasis.  3. Diverticulosis without diverticulitis.  4. Tree-in-bud nodular densities in the left lower lobe, likely  infectious or inflammatory.  5. Emphysema.  6. Aortic atherosclerosis.   I agree with the radiologist interpretation   Cardiac Monitoring:  The patient was maintained on a cardiac monitor.  I personally viewed and interpreted the cardiac monitored which showed an underlying rhythm of: nsr   Medicines ordered and prescription drug management:  I ordered medication including pepcid, gi cocktail, ivfs  for sx  Reevaluation of the patient after these medicines showed that the patient improved I have reviewed the patients home medicines and have made adjustments as needed   Test Considered:  ct   Critical Interventions:  protonix   Consultations Obtained:  I requested consultation with the hospitalist (Dr. Arville Care),  and discussed lab and imaging findings as well as pertinent plan - he will admit for obs    Problem List / ED Course:  Abd pain with worsening inflammation in gastric and duodenal area despite treatment.  I have started her on iv protonix.     Reevaluation:  After the interventions noted above, I reevaluated the patient and found that they have :improved   Social Determinants of Health:  Lives at home   Dispostion:  After consideration of the diagnostic results and the patients response to treatment, I feel that the patent would benefit from admission.          Final Clinical Impression(s) / ED Diagnoses Final diagnoses:  Acute gastric ulcer, unspecified whether gastric ulcer hemorrhage or perforation present  Epigastric pain    Rx /  DC Orders ED Discharge Orders     None         Jacalyn Lefevre, MD 08/09/22 2326

## 2022-08-09 NOTE — ED Notes (Signed)
ED TO INPATIENT HANDOFF REPORT  ED Nurse Name and Phone #: [phone] [name]  S Name/Age/Gender [name] 82 y.o. female Room/Bed: 031C/031C  Code Status   Code Status: Prior  Home/SNF/Other Home Patient oriented to: self, place, time, and situation Is this baseline? Yes   Triage Complete: Triage complete  Chief Complaint Acute gastritis without bleeding [K29.00]  Triage Note No notes on file   Allergies Allergies  Allergen Reactions   Augmentin [Amoxicillin-Pot Clavulanate] Nausea And Vomiting    .Marland KitchenHas patient had a PCN reaction causing immediate rash, facial/tongue/throat swelling, SOB or lightheadedness with hypotension: No Has patient had a PCN reaction causing severe rash involving mucus membranes or skin necrosis: No Has patient had a PCN reaction that required hospitalization No Has patient had a PCN reaction occurring within the last 10 years: No If all of the above answers are "NO", then may proceed with Cephalosporin use.    Black Cohosh Nausea And Vomiting    Severe GI Upset, sweats   Codeine Nausea And Vomiting    Severe GI upset, sweats   Hyoscyamine Other (See Comments)    Cramps, made urinary symptoms worse   Oxybutynin Chloride Other (See Comments)    Cramps, made urinary symptoms worse   Minocycline Other (See Comments)    Scratchy tongue, swollen lips    Level of Care/Admitting Diagnosis ED Disposition     ED Disposition  Admit   Condition  --   Comment  Hospital Area: [facility] [[id number]]  Level of Care: Med-Surg [16]  May place patient in observation at [facility] or [facility] if equivalent level of care is available:: No  Covid Evaluation: Asymptomatic - no recent exposure (last 10 days) testing not required  Diagnosis: Acute gastritis without bleeding [132440]  Admitting Physician: [name] [[id number]]  Attending Physician: [name] [[id number]]          B Medical/Surgery History Past Medical  History:  Diagnosis Date   Allergy    Anemia    Breast cancer (HCC)    Cataract    COPD (chronic obstructive pulmonary disease) (HCC)    Depression    Essential hypertension 11/29/2014   Glaucoma    Hemoptysis    Hypothyroidism    Intractable nausea and vomiting 07/23/2022   Left bundle branch block 03/16/2005   Oxygen deficiency    patient was using oxygen at home, her pulmonologist discontinued it and patient stopped using it on Monday Mar 20, 2015   Personal history of radiation therapy    Restless leg syndrome    Sinusitis    Vertigo    Past Surgical History:  Procedure Laterality Date   ABDOMINAL HYSTERECTOMY     APPENDECTOMY  2009   BIOPSY  07/24/2022   Procedure: BIOPSY;  Surgeon: Abigail Fiedler, MD;  Location: [facility];  Service: Gastroenterology;;   BREAST CYST ASPIRATION Right 06/12/2016   BREAST LUMPECTOMY Right 02/04/2020   BREAST LUMPECTOMY Left 02/04/2020   BREAST LUMPECTOMY WITH RADIOACTIVE SEED AND SENTINEL LYMPH NODE BIOPSY Bilateral 02/04/2020   Procedure: BILATERAL BREAST LUMPECTOMY WITH RADIOACTIVE SEED , RIGHT X 2, LEFT X 1 AND RIGHT SENTINEL LYMPH NODE MAPPING;  Surgeon: Abigail Bouillon, MD;  Location: [facility] OR;  Service: General;  Laterality: Bilateral;   CORONARY STENT INTERVENTION N/A 10/24/2021   Procedure: CORONARY STENT INTERVENTION;  Surgeon: [name], Abigail M, MD;  Location: [facility] INVASIVE CV LAB;  Service: Cardiovascular;  Laterality: N/A;   ESOPHAGOGASTRODUODENOSCOPY N/A 07/24/2022  Procedure: ESOPHAGOGASTRODUODENOSCOPY (EGD);  Surgeon: Abigail Fiedler, MD;  Location: [facility];  Service: Gastroenterology;  Laterality: N/A;   LEFT HEART CATH AND CORONARY ANGIOGRAPHY N/A 10/24/2021   Procedure: LEFT HEART CATH AND CORONARY ANGIOGRAPHY;  Surgeon: [name], Abigail M, MD;  Location: [facility] INVASIVE CV LAB;  Service: Cardiovascular;  Laterality: N/A;   RE-EXCISION OF BREAST LUMPECTOMY Bilateral 03/03/2020   Procedure: RE-EXCISION BILATERAL BREAST LUMPECTOMY;   Surgeon: Abigail Bouillon, MD;  Location: [facility];  Service: General;  Laterality: Bilateral;   VESICOVAGINAL FISTULA CLOSURE W/ TAH  1992     A IV Location/Drains/Wounds Patient Lines/Drains/Airways Status     Active Line/Drains/Airways     Name Placement date Placement time Site Days   Peripheral IV 08/09/22 18 G Anterior;Left Forearm 08/09/22  1948  Forearm  less than 1            Intake/Output Last 24 hours  Intake/Output Summary (Last 24 hours) at 08/09/2022 2311 Last data filed at 08/09/2022 2112 Gross per 24 hour  Intake 1050 ml  Output --  Net 1050 ml    Labs/Imaging Results for orders placed or performed during the hospital encounter of 08/09/22 (from the past 48 hour(s))  Urinalysis, Routine w reflex microscopic -Urine, Clean Catch     Status: Abnormal   Collection Time: 08/09/22  7:59 PM  Result Value Ref Range   Color, Urine AMBER (A) YELLOW    Comment: BIOCHEMICALS MAY BE AFFECTED BY COLOR   APPearance CLOUDY (A) CLEAR   Specific Gravity, Urine 1.017 1.005 - 1.030   pH 5.0 5.0 - 8.0   Glucose, UA NEGATIVE NEGATIVE mg/dL   Hgb urine dipstick SMALL (A) NEGATIVE   Bilirubin Urine NEGATIVE NEGATIVE   Ketones, ur NEGATIVE NEGATIVE mg/dL   Protein, ur 30 (A) NEGATIVE mg/dL   Nitrite POSITIVE (A) NEGATIVE   Leukocytes,Ua MODERATE (A) NEGATIVE   RBC / HPF 21-50 0 - 5 RBC/hpf   WBC, UA >50 0 - 5 WBC/hpf   Bacteria, UA MANY (A) NONE SEEN   Squamous Epithelial / HPF 6-10 0 - 5 /HPF   WBC Clumps PRESENT    Mucus PRESENT    Hyaline Casts, UA PRESENT    Non Squamous Epithelial 0-5 (A) NONE SEEN    Comment: Performed at [facility], [address] [place], [place] [address]  CBC with Differential     Status: Abnormal   Collection Time: 08/09/22  8:13 PM  Result Value Ref Range   WBC 9.4 4.0 - 10.5 K/uL   RBC 3.09 (L) 3.87 - 5.11 MIL/uL   Hemoglobin 9.3 (L) 12.0 - 15.0 g/dL   HCT 60.4 (L) 54.0 - 98.1 %   MCV 95.8 80.0 - 100.0 fL   MCH  30.1 26.0 - 34.0 pg   MCHC 31.4 30.0 - 36.0 g/dL   RDW 19.1 47.8 - 29.5 %   Platelets 322 150 - 400 K/uL   nRBC 0.0 0.0 - 0.2 %   Neutrophils Relative % 78 %   Neutro Abs 7.5 1.7 - 7.7 K/uL   Lymphocytes Relative 12 %   Lymphs Abs 1.1 0.7 - 4.0 K/uL   Monocytes Relative 7 %   Monocytes Absolute 0.6 0.1 - 1.0 K/uL   Eosinophils Relative 2 %   Eosinophils Absolute 0.2 0.0 - 0.5 K/uL   Basophils Relative 1 %   Basophils Absolute 0.1 0.0 - 0.1 K/uL   Immature Granulocytes 0 %   Abs Immature Granulocytes  0.03 0.00 - 0.07 K/uL    Comment: Performed at [facility], [address] [place], [place] [address]  Comprehensive metabolic panel     Status: Abnormal   Collection Time: 08/09/22  8:13 PM  Result Value Ref Range   Sodium 134 (L) 135 - 145 mmol/L   Potassium 3.3 (L) 3.5 - 5.1 mmol/L   Chloride 101 98 - 111 mmol/L   CO2 23 22 - 32 mmol/L   Glucose, Bld 107 (H) 70 - 99 mg/dL    Comment: Glucose reference range applies only to samples taken after fasting for at least 8 hours.   BUN 15 8 - 23 mg/dL   Creatinine, Ser 6.21 0.44 - 1.00 mg/dL   Calcium 8.5 (L) 8.9 - 10.3 mg/dL   Total Protein 6.4 (L) 6.5 - 8.1 g/dL   Albumin 3.0 (L) 3.5 - 5.0 g/dL   AST 17 15 - 41 U/L   ALT 13 0 - 44 U/L   Alkaline Phosphatase 88 38 - 126 U/L   Total Bilirubin 0.3 0.3 - 1.2 mg/dL   GFR, Estimated 59 (L) >60 mL/min    Comment: (NOTE) Calculated using the CKD-EPI Creatinine Equation (2021)    Anion gap 10 5 - 15    Comment: Performed at [facility], [address], [place], [place] [address]  Lipase, blood     Status: Abnormal   Collection Time: 08/09/22  8:13 PM  Result Value Ref Range   Lipase 265 (H) 11 - 51 U/L    Comment: Performed at [facility], [address] [place], [place] [address]   CT ABDOMEN PELVIS W CONTRAST  Result Date: 08/09/2022 CLINICAL DATA:  Generalized upper abdominal discomfort. EXAM: CT ABDOMEN AND PELVIS WITH CONTRAST TECHNIQUE: Multidetector CT  imaging of the abdomen and pelvis was performed using the standard protocol following bolus administration of intravenous contrast. RADIATION DOSE REDUCTION: This exam was performed according to the departmental dose-optimization program which includes automated exposure control, adjustment of the mA and/or kV according to patient size and/or use of iterative reconstruction technique. CONTRAST:  75mL OMNIPAQUE IOHEXOL 350 MG/ML SOLN COMPARISON:  12/09/2007. FINDINGS: Lower chest: Emphysematous changes are noted at the lung bases. There tree-in-bud nodular opacities and bronchiectasis in the left lower lobe, likely infectious or inflammatory. Hepatobiliary: No focal liver abnormality is seen. Stones are present within the gallbladder. No biliary ductal dilatation. Pancreas: A 1.4 cm hypodensity is noted in the tail of the pancreas, axial image 23. No biliary ductal dilatation or surrounding fat stranding. Spleen: Normal in size without focal abnormality. Adrenals/Urinary Tract: The adrenal glands are within normal limits. The kidneys enhance symmetrically. No renal calculus or hydronephrosis. The bladder is unremarkable. Stomach/Bowel: There is gastric wall thickening with surrounding fat stranding and edema, most pronounced at the gastric antrum with possible ulcerations. There is mild thickening of the walls of the proximal duodenum with associated fat stranding. No bowel obstruction, free air, or pneumatosis. A few scattered diverticula are noted along the colon without evidence of diverticulitis. The appendix is not seen. Vascular/Lymphatic: Aortic atherosclerosis. No enlarged abdominal or pelvic lymph nodes. Reproductive: Status post hysterectomy. No adnexal masses. Other: No abdominopelvic ascites. A small fat containing umbilical hernia is present. Musculoskeletal: Degenerative changes are present in the thoracolumbar spine. No acute osseous abnormality is seen. IMPRESSION: 1. Gastric wall thickening with  surrounding fat stranding and edema, most pronounced at the gastric antrum with possible ulcerations. There is also thickening of  the walls of the proximal duodenum. Findings are suggestive of gastritis/duodenitis. Endoscopy is recommended for further evaluation on follow-up. 2. Cholelithiasis. 3. Diverticulosis without diverticulitis. 4. Tree-in-bud nodular densities in the left lower lobe, likely infectious or inflammatory. 5. Emphysema. 6. Aortic atherosclerosis. Electronically Signed   By: Thornell Sartorius [name].D.   On: 08/09/2022 22:27    Pending Labs Unresulted Labs (From admission, onward)    None       Vitals/Pain Today's Vitals   08/09/22 2145 08/09/22 2230 08/09/22 2245 08/09/22 2300  BP: (!) 133/59 (!) 127/58 (!) 139/104 135/63  Pulse: 67 69 70 68  Resp: 16 12 19 16   Temp:      TempSrc:      SpO2: 100% 99% 99% 100%  Weight:      Height:      PainSc:        Isolation Precautions No active isolations  Medications Medications  pantoprazole (PROTONIX) 80 mg /NS 100 mL IVPB (has no administration in time range)  pantoprozole (PROTONIX) 80 mg /NS 100 mL infusion (has no administration in time range)  sodium chloride 0.9 % bolus 1,000 mL (0 mLs Intravenous Stopped 08/09/22 2112)  famotidine (PEPCID) IVPB 20 mg premix (0 mg Intravenous Stopped 08/09/22 2045)  alum & mag hydroxide-simeth (MAALOX/MYLANTA) 200-200-20 MG/5ML suspension 30 mL (30 mLs Oral Given 08/09/22 2018)  iohexol (OMNIPAQUE) 350 MG/ML injection 75 mL (75 mLs Intravenous Contrast Given 08/09/22 2209)    Mobility walks     Focused Assessments Abdominal assessment gastric ulcer UTI    R Recommendations: See Admitting Provider Note  Report given to:   Additional Notes: Patient a/o x 4 vs wnl ambulates with steady gait here for abdominal pain upper quads good bowel sounds no n/v/d current vs 135/63 68 16 100%ra

## 2022-08-09 NOTE — ED Notes (Signed)
Patient presents via ems from home c/o generalized upper abdominal discomfort denies n/v/d. Onset 2 hours pta hx of cardiac issues. Pt a/o x 4 respirations even and non labored  vs wnl will tx and monitor as directed

## 2022-08-09 NOTE — ED Notes (Signed)
Called floor advised pt coming up

## 2022-08-10 DIAGNOSIS — Z7902 Long term (current) use of antithrombotics/antiplatelets: Secondary | ICD-10-CM | POA: Diagnosis not present

## 2022-08-10 DIAGNOSIS — K2980 Duodenitis without bleeding: Secondary | ICD-10-CM | POA: Diagnosis present

## 2022-08-10 DIAGNOSIS — K922 Gastrointestinal hemorrhage, unspecified: Secondary | ICD-10-CM

## 2022-08-10 DIAGNOSIS — E039 Hypothyroidism, unspecified: Secondary | ICD-10-CM | POA: Diagnosis present

## 2022-08-10 DIAGNOSIS — F32A Depression, unspecified: Secondary | ICD-10-CM | POA: Diagnosis present

## 2022-08-10 DIAGNOSIS — J449 Chronic obstructive pulmonary disease, unspecified: Secondary | ICD-10-CM | POA: Diagnosis present

## 2022-08-10 DIAGNOSIS — Z79899 Other long term (current) drug therapy: Secondary | ICD-10-CM | POA: Diagnosis not present

## 2022-08-10 DIAGNOSIS — I700 Atherosclerosis of aorta: Secondary | ICD-10-CM | POA: Diagnosis present

## 2022-08-10 DIAGNOSIS — R1013 Epigastric pain: Secondary | ICD-10-CM | POA: Diagnosis present

## 2022-08-10 DIAGNOSIS — Z7951 Long term (current) use of inhaled steroids: Secondary | ICD-10-CM | POA: Diagnosis not present

## 2022-08-10 DIAGNOSIS — D649 Anemia, unspecified: Secondary | ICD-10-CM | POA: Diagnosis not present

## 2022-08-10 DIAGNOSIS — Z79890 Hormone replacement therapy (postmenopausal): Secondary | ICD-10-CM | POA: Diagnosis not present

## 2022-08-10 DIAGNOSIS — I255 Ischemic cardiomyopathy: Secondary | ICD-10-CM | POA: Diagnosis present

## 2022-08-10 DIAGNOSIS — I447 Left bundle-branch block, unspecified: Secondary | ICD-10-CM | POA: Diagnosis present

## 2022-08-10 DIAGNOSIS — K2900 Acute gastritis without bleeding: Secondary | ICD-10-CM | POA: Diagnosis present

## 2022-08-10 DIAGNOSIS — Z881 Allergy status to other antibiotic agents status: Secondary | ICD-10-CM | POA: Diagnosis not present

## 2022-08-10 DIAGNOSIS — E876 Hypokalemia: Secondary | ICD-10-CM

## 2022-08-10 DIAGNOSIS — K920 Hematemesis: Secondary | ICD-10-CM | POA: Diagnosis not present

## 2022-08-10 DIAGNOSIS — G2581 Restless legs syndrome: Secondary | ICD-10-CM | POA: Diagnosis present

## 2022-08-10 DIAGNOSIS — N390 Urinary tract infection, site not specified: Secondary | ICD-10-CM | POA: Diagnosis present

## 2022-08-10 DIAGNOSIS — R109 Unspecified abdominal pain: Secondary | ICD-10-CM | POA: Diagnosis not present

## 2022-08-10 DIAGNOSIS — I2510 Atherosclerotic heart disease of native coronary artery without angina pectoris: Secondary | ICD-10-CM | POA: Diagnosis present

## 2022-08-10 DIAGNOSIS — I5022 Chronic systolic (congestive) heart failure: Secondary | ICD-10-CM | POA: Diagnosis present

## 2022-08-10 DIAGNOSIS — E871 Hypo-osmolality and hyponatremia: Secondary | ICD-10-CM | POA: Diagnosis present

## 2022-08-10 DIAGNOSIS — E785 Hyperlipidemia, unspecified: Secondary | ICD-10-CM | POA: Diagnosis present

## 2022-08-10 DIAGNOSIS — I252 Old myocardial infarction: Secondary | ICD-10-CM | POA: Diagnosis not present

## 2022-08-10 DIAGNOSIS — I110 Hypertensive heart disease with heart failure: Secondary | ICD-10-CM | POA: Diagnosis present

## 2022-08-10 DIAGNOSIS — H409 Unspecified glaucoma: Secondary | ICD-10-CM | POA: Diagnosis present

## 2022-08-10 DIAGNOSIS — D6489 Other specified anemias: Secondary | ICD-10-CM | POA: Diagnosis present

## 2022-08-10 LAB — CBC
HCT: 26.9 % — ABNORMAL LOW (ref 36.0–46.0)
Hemoglobin: 8.4 g/dL — ABNORMAL LOW (ref 12.0–15.0)
MCH: 30.9 pg (ref 26.0–34.0)
MCHC: 31.2 g/dL (ref 30.0–36.0)
MCV: 98.9 fL (ref 80.0–100.0)
Platelets: 271 10*3/uL (ref 150–400)
RBC: 2.72 MIL/uL — ABNORMAL LOW (ref 3.87–5.11)
RDW: 15 % (ref 11.5–15.5)
WBC: 6.4 10*3/uL (ref 4.0–10.5)
nRBC: 0 % (ref 0.0–0.2)

## 2022-08-10 LAB — BASIC METABOLIC PANEL
Anion gap: 5 (ref 5–15)
BUN: 11 mg/dL (ref 8–23)
CO2: 25 mmol/L (ref 22–32)
Calcium: 8.1 mg/dL — ABNORMAL LOW (ref 8.9–10.3)
Chloride: 109 mmol/L (ref 98–111)
Creatinine, Ser: 0.85 mg/dL (ref 0.44–1.00)
GFR, Estimated: >60 mL/min (ref >60)
Glucose, Bld: 89 mg/dL (ref 70–99)
Potassium: 3.3 mmol/L — ABNORMAL LOW (ref 3.5–5.1)
Sodium: 139 mmol/L (ref 135–145)

## 2022-08-10 LAB — LIPASE, BLOOD: Lipase: 130 U/L — ABNORMAL HIGH (ref 11–51)

## 2022-08-10 MED ORDER — POTASSIUM CHLORIDE 20 MEQ PO PACK
40.0000 meq | PACK | Freq: Two times a day (BID) | ORAL | Status: AC
  Administered 2022-08-10 (×2): 40 meq via ORAL
  Filled 2022-08-10 (×2): qty 2

## 2022-08-10 MED ORDER — SUCRALFATE 1 GM/10ML PO SUSP
1.0000 g | Freq: Three times a day (TID) | ORAL | Status: DC
  Administered 2022-08-10 – 2022-08-11 (×5): 1 g via ORAL
  Filled 2022-08-10 (×5): qty 10

## 2022-08-10 MED ORDER — POTASSIUM CHLORIDE 20 MEQ PO PACK
40.0000 meq | PACK | Freq: Once | ORAL | Status: AC
  Administered 2022-08-10: 40 meq via ORAL
  Filled 2022-08-10: qty 2

## 2022-08-10 MED ORDER — SODIUM CHLORIDE 0.9 % IV SOLN: INTRAVENOUS | Status: AC

## 2022-08-10 MED ORDER — SODIUM CHLORIDE 0.9 % IV SOLN
2.0000 g | Freq: Every day | INTRAVENOUS | Status: DC
  Administered 2022-08-10 – 2022-08-11 (×2): 2 g via INTRAVENOUS
  Filled 2022-08-10 (×2): qty 20

## 2022-08-10 NOTE — H&P (Addendum)
[place]   PATIENT NAME: [name]    MR#:  [id number]  DATE OF BIRTH:  August 10, 1940  DATE OF ADMISSION:  08/09/2022  PRIMARY CARE PHYSICIAN: Mila Palmer, MD   Patient is coming from: Home  REQUESTING/REFERRING PHYSICIAN: Jacalyn Lefevre, MD 6  CHIEF COMPLAINT:   Chief Complaint  Patient presents with   Abdominal Pain    Arrived via ems from home c/o upper abdominal discomfort x 2 hours denies n/v/d ems vitals 150/90 90 18 95% ra cbg 116 given NS bolus    HISTORY OF PRESENT ILLNESS:  ALAIRA [name] is a 82 y.o. female with medical history significant for COPD, depression, hypertension, hypothyroidism, left bundle branch block, coronary artery disease status post PCI and stent, RLS as well as peptic ulcer disease with recent admission for GI bleeding from duodenal ulcer with associated nonbleeding gastric ulcers from 6/2 till 6/5.  She was told to stop Mobic and take Protonix twice daily and to hold off aspirin.  She has been compliant with instructions.  She continues to have epigastric abdominal discomfort resolved without heartburn or reflux or melena or hematemesis.  No fever or chills.  No other bleeding diathesis.  No dysuria, oliguria or hematuria or flank pain.  No cough or wheezing.  No chest pain or palpitations.    ED Course: When she came to the ER BP was 150/72 with otherwise normal vital signs.  Labs revealed mild hyponatremia 134 and hypokalemia 3.3 with a calcium of 8.5, albumin 3 and total protein 6.4 with serum lipase of 265.  UA showed a UTI. EKG as reviewed by me : EKG showed sinus rhythm with a rate of 77 with PACs and left bundle branch block. Imaging: Abdominal and pelvic CT scan showed the following: 1. Gastric wall thickening with surrounding fat stranding and edema, most pronounced at the gastric antrum with possible ulcerations. There is also thickening of the walls of the proximal duodenum. Findings are suggestive of  gastritis/duodenitis. Endoscopy is recommended for further evaluation on follow-up. 2. Cholelithiasis. 3. Diverticulosis without diverticulitis. 4. Tree-in-bud nodular densities in the left lower lobe, likely infectious or inflammatory. 5. Emphysema. 6. Aortic atherosclerosis.  The patient was given IV Protonix bolus followed by drip as well as IV Pepcid and GI cocktail.  She will be admitted to a medical observation bed for further evaluation and management. PAST MEDICAL HISTORY:   Past Medical History:  Diagnosis Date   Allergy    Anemia    Breast cancer (HCC)    Cataract    COPD (chronic obstructive pulmonary disease) (HCC)    Depression    Essential hypertension 11/29/2014   Glaucoma    Hemoptysis    Hypothyroidism    Intractable nausea and vomiting 07/23/2022   Left bundle branch block 03/16/2005   Oxygen deficiency    patient was using oxygen at home, her pulmonologist discontinued it and patient stopped using it on Monday [name] 29, 2017   Personal history of radiation therapy    Restless leg syndrome    Sinusitis    Vertigo   Coronary artery disease status post PCI and stent  PAST SURGICAL HISTORY:   Past Surgical History:  Procedure Laterality Date   ABDOMINAL HYSTERECTOMY     APPENDECTOMY  2009   BIOPSY  07/24/2022   Procedure: BIOPSY;  Surgeon: Beverley Fiedler, MD;  Location: [facility];  Service: Gastroenterology;;   BREAST CYST ASPIRATION Right 06/12/2016   BREAST LUMPECTOMY Right  02/04/2020   BREAST LUMPECTOMY Left 02/04/2020   BREAST LUMPECTOMY WITH RADIOACTIVE SEED AND SENTINEL LYMPH NODE BIOPSY Bilateral 02/04/2020   Procedure: BILATERAL BREAST LUMPECTOMY WITH RADIOACTIVE SEED , RIGHT X 2, LEFT X 1 AND RIGHT SENTINEL LYMPH NODE MAPPING;  Surgeon: Harriette Bouillon, MD;  Location: [facility] OR;  Service: General;  Laterality: Bilateral;   CORONARY STENT INTERVENTION N/A 10/24/2021   Procedure: CORONARY STENT INTERVENTION;  Surgeon: Swaziland, Peter M, MD;  Location: [facility]  INVASIVE CV LAB;  Service: Cardiovascular;  Laterality: N/A;   ESOPHAGOGASTRODUODENOSCOPY N/A 07/24/2022   Procedure: ESOPHAGOGASTRODUODENOSCOPY (EGD);  Surgeon: Beverley Fiedler, MD;  Location: [facility];  Service: Gastroenterology;  Laterality: N/A;   LEFT HEART CATH AND CORONARY ANGIOGRAPHY N/A 10/24/2021   Procedure: LEFT HEART CATH AND CORONARY ANGIOGRAPHY;  Surgeon: Swaziland, Peter M, MD;  Location: [facility] INVASIVE CV LAB;  Service: Cardiovascular;  Laterality: N/A;   RE-EXCISION OF BREAST LUMPECTOMY Bilateral 03/03/2020   Procedure: RE-EXCISION BILATERAL BREAST LUMPECTOMY;  Surgeon: Harriette Bouillon, MD;  Location: [facility];  Service: General;  Laterality: Bilateral;   VESICOVAGINAL FISTULA CLOSURE W/ TAH  1992    SOCIAL HISTORY:   Social History   Tobacco Use   Smoking status: Never   Smokeless tobacco: Never  Substance Use Topics   Alcohol use: No    Alcohol/week: 0.0 standard drinks of alcohol    FAMILY HISTORY:   Family History  Problem Relation Age of Onset   Emphysema Mother        smoker   Heart disease Mother    COPD Mother    Colon polyps Mother    Emphysema Father        smoker   Heart disease Father    Breast cancer Sister    Colon cancer Neg Hx    Esophageal cancer Neg Hx    Stomach cancer Neg Hx    Rectal cancer Neg Hx     DRUG ALLERGIES:   Allergies  Allergen Reactions   Augmentin [Amoxicillin-Pot Clavulanate] Nausea And Vomiting    .Marland KitchenHas patient had a PCN reaction causing immediate rash, facial/tongue/throat swelling, SOB or lightheadedness with hypotension: No Has patient had a PCN reaction causing severe rash involving mucus membranes or skin necrosis: No Has patient had a PCN reaction that required hospitalization No Has patient had a PCN reaction occurring within the last 10 years: No If all of the above answers are "NO", then may proceed with Cephalosporin use.    Black Cohosh Nausea And Vomiting    Severe GI Upset, sweats    Codeine Nausea And Vomiting    Severe GI upset, sweats   Hyoscyamine Other (See Comments)    Cramps, made urinary symptoms worse   Oxybutynin Chloride Other (See Comments)    Cramps, made urinary symptoms worse   Minocycline Other (See Comments)    Scratchy tongue, swollen lips    REVIEW OF SYSTEMS:   ROS As per history of present illness. All pertinent systems were reviewed above. Constitutional, HEENT, cardiovascular, respiratory, GI, GU, musculoskeletal, neuro, psychiatric, endocrine, integumentary and hematologic systems were reviewed and are otherwise negative/unremarkable except for positive findings mentioned above in the HPI.   MEDICATIONS AT HOME:   Prior to Admission medications   Medication Sig Start Date End Date Taking? Authorizing Provider  albuterol (VENTOLIN HFA) 108 (90 Base) MCG/ACT inhaler Inhale 2 puffs into the lungs every 6 (six) hours as needed for wheezing or shortness of breath. 11/27/18  Yes Waymon Budge, MD  azelastine (ASTELIN) 0.1 % nasal spray 1-2 puffs each nostril twice daily if needed Patient taking differently: Place 1-2 sprays into both nostrils daily as needed for rhinitis. 11/26/17  Yes Young, Joni Fears D, MD  benzonatate (TESSALON) 200 MG capsule Take 1 capsule (200 mg total) by mouth 3 (three) times daily as needed for cough. 05/27/20  Yes Young, Joni Fears D, MD  Cholecalciferol (VITAMIN D) 50 MCG (2000 UT) tablet Take 2,000 Units by mouth daily.   Yes [provider]  clopidogrel (PLAVIX) 75 MG tablet Take 1 tablet (75 mg total) by mouth daily with breakfast. 05/11/22  Yes Parke Poisson, MD  Coenzyme Q10 (COQ-10) 100 MG CAPS Take 100 mg by mouth daily.   Yes [provider]  dorzolamide (TRUSOPT) 2 % ophthalmic solution Place 1 drop into both eyes at bedtime.   Yes [provider]  EUTHYROX 75 MCG tablet Take 75 mcg by mouth every morning. 10/11/21  Yes [provider]  fluticasone (FLONASE) 50 MCG/ACT nasal spray  Place 2 sprays into both nostrils daily. Patient taking differently: Place 2 sprays into both nostrils daily as needed for allergies. 07/29/13  Yes Ginnie Smart, MD  Fluticasone-Umeclidin-Vilant (TRELEGY ELLIPTA) 100-62.5-25 MCG/ACT AEPB Inhale 1 puff into the lungs daily. Patient taking differently: Inhale 1 puff into the lungs daily as needed (sob/wheezing). 02/16/21  Yes Young, Joni Fears D, MD  metoprolol succinate (TOPROL-XL) 50 MG 24 hr tablet Take 1 tablet (50 mg total) by mouth daily. Take with or immediately following a meal. 06/19/22  Yes Goodrich, Carmie Kanner E, PA-C  Multiple Minerals (CALCIUM/MAGNESIUM/ZINC) TABS Take 3 tablets by mouth at bedtime.   Yes [provider]  Multiple Vitamin (MULTIVITAMIN) tablet Take 1 tablet by mouth daily.   Yes [provider]  pantoprazole (PROTONIX) 40 MG tablet Take 1 tablet (40 mg total) by mouth 2 (two) times daily before a meal. Patient taking differently: Take 40 mg by mouth daily as needed (heartburn). 07/25/22  Yes Jonah Blue, MD  pyridOXINE (VITAMIN B6) 100 MG tablet Take 100 mg by mouth daily.   Yes [provider]  venlafaxine XR (EFFEXOR-XR) 37.5 MG 24 hr capsule Take 37.5 mg by mouth daily. 09/28/21  Yes [provider]  vitamin C (ASCORBIC ACID) 500 MG tablet Take 500 mg by mouth daily.   Yes [provider]  vitamin E 400 UNIT capsule Take 400 Units by mouth daily.   Yes [provider]  latanoprost (XALATAN) 0.005 % ophthalmic solution Place 1 drop into both eyes at bedtime. Patient not taking: Reported on 08/09/2022    [provider]      VITAL SIGNS:  Blood pressure 139/66, pulse 73, temperature 98.4 F (36.9 C), temperature source Oral, resp. rate 18, height 5\' 7"  (1.702 m), weight 56.7 kg, SpO2 99 %.  PHYSICAL EXAMINATION:  Physical Exam  GENERAL:  82 y.o.-year-old patient lying in the bed with no acute distress.  EYES: Pupils equal, round, reactive to light and  accommodation. No scleral icterus. Extraocular muscles intact.  HEENT: Head atraumatic, normocephalic. Oropharynx and nasopharynx clear.  NECK:  Supple, no jugular venous distention. No thyroid enlargement, no tenderness.  LUNGS: Normal breath sounds bilaterally, no wheezing, rales,rhonchi or crepitation. No use of accessory muscles of respiration.  CARDIOVASCULAR: Regular rate and rhythm, S1, S2 normal. No murmurs, rubs, or gallops.  ABDOMEN: Soft, nondistended, with mild epigastric tenderness without rebound tenderness guarding or rigidity.   Bowel sounds present. No organomegaly or mass.  EXTREMITIES: No  pedal edema, cyanosis, or clubbing.  NEUROLOGIC: Cranial nerves II through XII are intact. Muscle strength 5/5 in all extremities. Sensation intact. Gait not checked.  PSYCHIATRIC: The patient is alert and oriented x 3.  Normal affect and good eye contact. SKIN: No obvious rash, lesion, or ulcer.   LABORATORY PANEL:   CBC Recent Labs  Lab 08/09/22 2013  WBC 9.4  HGB 9.3*  HCT 29.6*  PLT 322   ------------------------------------------------------------------------------------------------------------------  Chemistries  Recent Labs  Lab 08/09/22 2013  NA 134*  K 3.3*  CL 101  CO2 23  GLUCOSE 107*  BUN 15  CREATININE 0.95  CALCIUM 8.5*  AST 17  ALT 13  ALKPHOS 88  BILITOT 0.3   ------------------------------------------------------------------------------------------------------------------  Cardiac Enzymes No results for input(s): "TROPONINI" in the last 168 hours. ------------------------------------------------------------------------------------------------------------------  RADIOLOGY:  CT ABDOMEN PELVIS W CONTRAST  Result Date: 08/09/2022 CLINICAL DATA:  Generalized upper abdominal discomfort. EXAM: CT ABDOMEN AND PELVIS WITH CONTRAST TECHNIQUE: Multidetector CT imaging of the abdomen and pelvis was performed using the standard protocol following bolus  administration of intravenous contrast. RADIATION DOSE REDUCTION: This exam was performed according to the departmental dose-optimization program which includes automated exposure control, adjustment of the mA and/or kV according to patient size and/or use of iterative reconstruction technique. CONTRAST:  75mL OMNIPAQUE IOHEXOL 350 MG/ML SOLN COMPARISON:  12/09/2007. FINDINGS: Lower chest: Emphysematous changes are noted at the lung bases. There tree-in-bud nodular opacities and bronchiectasis in the left lower lobe, likely infectious or inflammatory. Hepatobiliary: No focal liver abnormality is seen. Stones are present within the gallbladder. No biliary ductal dilatation. Pancreas: A 1.4 cm hypodensity is noted in the tail of the pancreas, axial image 23. No biliary ductal dilatation or surrounding fat stranding. Spleen: Normal in size without focal abnormality. Adrenals/Urinary Tract: The adrenal glands are within normal limits. The kidneys enhance symmetrically. No renal calculus or hydronephrosis. The bladder is unremarkable. Stomach/Bowel: There is gastric wall thickening with surrounding fat stranding and edema, most pronounced at the gastric antrum with possible ulcerations. There is mild thickening of the walls of the proximal duodenum with associated fat stranding. No bowel obstruction, free air, or pneumatosis. A few scattered diverticula are noted along the colon without evidence of diverticulitis. The appendix is not seen. Vascular/Lymphatic: Aortic atherosclerosis. No enlarged abdominal or pelvic lymph nodes. Reproductive: Status post hysterectomy. No adnexal masses. Other: No abdominopelvic ascites. A small fat containing umbilical hernia is present. Musculoskeletal: Degenerative changes are present in the thoracolumbar spine. No acute osseous abnormality is seen. IMPRESSION: 1. Gastric wall thickening with surrounding fat stranding and edema, most pronounced at the gastric antrum with possible  ulcerations. There is also thickening of the walls of the proximal duodenum. Findings are suggestive of gastritis/duodenitis. Endoscopy is recommended for further evaluation on follow-up. 2. Cholelithiasis. 3. Diverticulosis without diverticulitis. 4. Tree-in-bud nodular densities in the left lower lobe, likely infectious or inflammatory. 5. Emphysema. 6. Aortic atherosclerosis. Electronically Signed   By: Thornell Sartorius M.D.   On: 08/09/2022 22:27      IMPRESSION AND PLAN:  Assessment and Plan: * Acute gastritis without bleeding - This is associated with acute duodenitis.  The patient has recent gastric and duodenal ulcers on EGD 07/24/2022. - This is likely the main culprit for her epigastric abdominal discomfort. - She has elevated lipase [name] that is likely secondary to her gastritis and duodenitis.  I doubt pancreatitis in her case but this is certainly a possibility as well. - We will hold off Plavix. -  We will keep her n.p.o. except for medications. - She will be hydrated with IV normal saline. - We will continue her on IV Protonix drip. - Dr. Rhea Belton was notified about the patient.  Acute lower UTI - She will be placed on IV Rocephin and will follow urine culture and sensitivity.  Hypokalemia - We will replace potassium and follow. - We will check magnesium [name].  Essential hypertension - We will continue her Toprol-XL.  Depression - We will continue Effexor XR.  Hypothyroidism - We will continue levothyroxine.   DVT prophylaxis: SCDs.  Medical prophylaxis is currently contraindicated due to high risk of GI bleeding. Advanced Care Planning:  Code Status: full code. Family Communication:  The plan of care was discussed in details with the patient (and family). I answered all questions. The patient agreed to proceed with the above mentioned plan. Further management will depend upon hospital course. Disposition Plan: Back to previous home environment Consults called: GI I  consult All the records are reviewed and case discussed with ED provider.  Status is: Observation   I certify that at the time of admission, it is my clinical judgment that the patient will require  hospital care extending less than 2 midnights.                            Dispo: The patient is from: Home              Anticipated d/c is to: Home              Patient currently is not medically stable to d/c.              Difficult to place patient: No  Hannah Beat M.D on 08/10/2022 at 3:34 AM  [facility]   From 7 PM-7 AM, contact night-coverage [email or web address]  CC: Primary care physician; Mila Palmer, MD

## 2022-08-10 NOTE — Assessment & Plan Note (Signed)
-   She will be placed on IV Rocephin and will follow urine culture and sensitivity.

## 2022-08-10 NOTE — Assessment & Plan Note (Signed)
-   We will continue levothyroxine.

## 2022-08-10 NOTE — Assessment & Plan Note (Signed)
-   We will replace potassium and follow. - We will check magnesium level.

## 2022-08-10 NOTE — Assessment & Plan Note (Signed)
-   We will continue Effexor XR. 

## 2022-08-10 NOTE — Consult Note (Addendum)
Referring Provider: Dr. Jacalyn Lefevre Primary Care Physician:  Mila Palmer, MD Primary Gastroenterologist:  Dr. Amada Jupiter   Reason for Consultation: Abdominal pain  HPI: [name] is a 82 y.o. female with a past medical history of hypertension, hyperlipidemia, LBBB, artery disease s/p STEMI and stent placement 10/2021 on Plavix, LVEF 40 - 45%, hypothyroidism, bronchiectasis, breast cancer s/p lumpectomy and radiation 01/2020 on Arimidex and gastric/duodenal ulcers.   She was recently admitted to the hospital 07/23/2022 with nausea and vomiting.  Emesis was initially nonbloody then became coffee-ground with 1 episode of dark loose stool.  On Plavix, ASA and Meloxicam.  Received PPI IV.  CTA at that time was negative for active GI bleeding and showed wall thickening with inflammation of the gastric antrum concerning for gastritis/ulcer disease, cholelithiasis, sigmoid diverticulosis and tree-in-bud opacities in the left lower lung concerning for infectious/inflammatory process. Hemoglobin 12.2.  BUN 31 with creatinine 1.30.  EGD 07/24/2022 showed a benign-appearing esophageal stenosis, nonbleeding gastric ulcers and a large duodenal bulb ulcer with pigmented material.  Gastric biopsies were negative for H. pylori.  At the time of discharge 07/25/2022 she was instructed to hold Plavix for 7 days, hold aspirin for 12 weeks then discuss with cardiologist whether to restart it and to take Protonix 40 mg twice daily for 12 weeks then once daily.  She had recurrent upper abdominal pain which was similar to the pain she experienced prior to her admission 07/23/2022 so she presented to the ED 08/09/2018 for further evaluation. Labs in the ED showed a hemoglobin level of 9.3 which is stable when compared to her hemoglobin level at the time of hospital discharge on 6/5.  Normal LFTs.  Lipase 265.  CTAP with contrast showed gastric wall thickening with surrounding fat stranding/edema to the gastric antrum  concerning for possible ulcerations with thickening to the proximal duodenum, cholelithiasis, diverticulosis without evidence of diverticulitis and left lower lobe density as seen in prior CTA.  She was placed on PPI infusion.  A GI consult was requested for further evaluation regarding gastritis/duodenitis with ongoing supraumbilical abdominal pain.  She endorses being compliant taking Protonix 40 mg twice daily.  She restarted Plavix on 08/01/2022 and remains off aspirin.  Not taking Meloxicam.  Her last dose of Plavix was taken at 8 AM on 08/08/2022.  No dysphagia or heartburn.  She describes having pain above her umbilicus for the past 2 days.  No lower abdominal pain.  No further black stools.  She is passing brown formed stools daily.  No rectal bleeding.  No chest pain or shortness of breath.  She was started on Rocephin for UTI.  GI PROCEDURES:  EGD 07/24/2022: - Benign-appearing esophageal stenoses. - Non-bleeding gastric ulcers with no stigmata of bleeding. Biopsied. - Large duodenal bulb ulcers with pigmented material. -Normal second portion of the duodenum     Path report: Gastric antral / oxyntic mucosa with chronic inactive gastritis. Immunohistochemical stain for H. pylori was performed and is negative for H. pylori microorganisms.   Colonoscopy 03/21/2016: Decreased sphincter tone, a few small mouth diverticula in the left colon, otherwise normal.  EGD 01/21/2006: Reflux esophagitis  Colonoscopy 01/21/2006: Normal Colonoscopy  ECHO 08/07/2022: 1. Technically difficult study, recommend limited echo with contrast to  better evaluate LV systolic function. Left ventricular ejection fraction,  by estimation, is 40 to 45%. The left ventricle has mildly decreased  function. Left ventricular endocardial   border not optimally defined to evaluate regional wall motion. Left  ventricular  diastolic parameters are indeterminate.   2. Right ventricular systolic function is normal. The  right ventricular  size is normal. There is normal pulmonary artery systolic pressure. The  estimated right ventricular systolic pressure is 31.1 mmHg.   3. The mitral valve is normal in structure. Trivial mitral valve  regurgitation. No evidence of mitral stenosis.   4. The aortic valve is tricuspid. Aortic valve regurgitation is mild. No  aortic stenosis is present.   5. The inferior vena cava is normal in size with greater than 50%  respiratory variability, suggesting right atrial pressure of 3 mmHg.    Past Medical History:  Diagnosis Date   Allergy    Anemia    Breast cancer (HCC)    Cataract    COPD (chronic obstructive pulmonary disease) (HCC)    Depression    Essential hypertension 11/29/2014   Glaucoma    Hemoptysis    Hypothyroidism    Intractable nausea and vomiting 07/23/2022   Left bundle branch block 03/16/2005   Oxygen deficiency    patient was using oxygen at home, her pulmonologist discontinued it and patient stopped using it on Monday Mar 20, 2015   Personal history of radiation therapy    Restless leg syndrome    Sinusitis    Vertigo     Past Surgical History:  Procedure Laterality Date   ABDOMINAL HYSTERECTOMY     APPENDECTOMY  2009   BIOPSY  07/24/2022   Procedure: BIOPSY;  Surgeon: Beverley Fiedler, MD;  Location: [facility];  Service: Gastroenterology;;   BREAST CYST ASPIRATION Right 06/12/2016   BREAST LUMPECTOMY Right 02/04/2020   BREAST LUMPECTOMY Left 02/04/2020   BREAST LUMPECTOMY WITH RADIOACTIVE SEED AND SENTINEL LYMPH NODE BIOPSY Bilateral 02/04/2020   Procedure: BILATERAL BREAST LUMPECTOMY WITH RADIOACTIVE SEED , RIGHT X 2, LEFT X 1 AND RIGHT SENTINEL LYMPH NODE MAPPING;  Surgeon: Harriette Bouillon, MD;  Location: [facility] OR;  Service: General;  Laterality: Bilateral;   CORONARY STENT INTERVENTION N/A 10/24/2021   Procedure: CORONARY STENT INTERVENTION;  Surgeon: Swaziland, Peter M, MD;  Location: [facility];  Service: Cardiovascular;  Laterality:  N/A;   ESOPHAGOGASTRODUODENOSCOPY N/A 07/24/2022   Procedure: ESOPHAGOGASTRODUODENOSCOPY (EGD);  Surgeon: Beverley Fiedler, MD;  Location: [facility];  Service: Gastroenterology;  Laterality: N/A;   LEFT HEART CATH AND CORONARY ANGIOGRAPHY N/A 10/24/2021   Procedure: LEFT HEART CATH AND CORONARY ANGIOGRAPHY;  Surgeon: Swaziland, Peter M, MD;  Location: [facility] INVASIVE CV LAB;  Service: Cardiovascular;  Laterality: N/A;   RE-EXCISION OF BREAST LUMPECTOMY Bilateral 03/03/2020   Procedure: RE-EXCISION BILATERAL BREAST LUMPECTOMY;  Surgeon: Harriette Bouillon, MD;  Location: [facility];  Service: General;  Laterality: Bilateral;   VESICOVAGINAL FISTULA CLOSURE W/ TAH  1992    Prior to Admission medications   Medication Sig Start Date End Date Taking? Authorizing Provider  albuterol (VENTOLIN HFA) 108 (90 Base) MCG/ACT inhaler Inhale 2 puffs into the lungs every 6 (six) hours as needed for wheezing or shortness of breath. 11/27/18  Yes Young, Joni Fears D, MD  azelastine (ASTELIN) 0.1 % nasal spray 1-2 puffs each nostril twice daily if needed Patient taking differently: Place 1-2 sprays into both nostrils daily as needed for rhinitis. 11/26/17  Yes Young, Joni Fears D, MD  benzonatate (TESSALON) 200 MG capsule Take 1 capsule (200 mg total) by mouth 3 (three) times daily as needed for cough. 05/27/20  Yes Young, Joni Fears D, MD  Cholecalciferol (VITAMIN D) 50 MCG (2000 UT) tablet Take 2,000 Units  by mouth daily.   Yes [provider]  clopidogrel (PLAVIX) 75 MG tablet Take 1 tablet (75 mg total) by mouth daily with breakfast. 05/11/22  Yes Parke Poisson, MD  Coenzyme Q10 (COQ-10) 100 MG CAPS Take 100 mg by mouth daily.   Yes [provider]  dorzolamide (TRUSOPT) 2 % ophthalmic solution Place 1 drop into both eyes at bedtime.   Yes [provider]  EUTHYROX 75 MCG tablet Take 75 mcg by mouth every morning. 10/11/21  Yes [provider]  fluticasone (FLONASE) 50 MCG/ACT  nasal spray Place 2 sprays into both nostrils daily. Patient taking differently: Place 2 sprays into both nostrils daily as needed for allergies. 07/29/13  Yes Ginnie Smart, MD  Fluticasone-Umeclidin-Vilant (TRELEGY ELLIPTA) 100-62.5-25 MCG/ACT AEPB Inhale 1 puff into the lungs daily. Patient taking differently: Inhale 1 puff into the lungs daily as needed (sob/wheezing). 02/16/21  Yes Young, Joni Fears D, MD  metoprolol succinate (TOPROL-XL) 50 MG 24 hr tablet Take 1 tablet (50 mg total) by mouth daily. Take with or immediately following a meal. 06/19/22  Yes Goodrich, Carmie Kanner E, PA-C  Multiple Minerals (CALCIUM/MAGNESIUM/ZINC) TABS Take 3 tablets by mouth at bedtime.   Yes [provider]  Multiple Vitamin (MULTIVITAMIN) tablet Take 1 tablet by mouth daily.   Yes [provider]  pantoprazole (PROTONIX) 40 MG tablet Take 1 tablet (40 mg total) by mouth 2 (two) times daily before a meal. Patient taking differently: Take 40 mg by mouth daily as needed (heartburn). 07/25/22  Yes Jonah Blue, MD  pyridOXINE (VITAMIN B6) 100 MG tablet Take 100 mg by mouth daily.   Yes [provider]  venlafaxine XR (EFFEXOR-XR) 37.5 MG 24 hr capsule Take 37.5 mg by mouth daily. 09/28/21  Yes [provider]  vitamin C (ASCORBIC ACID) 500 MG tablet Take 500 mg by mouth daily.   Yes [provider]  vitamin E 400 UNIT capsule Take 400 Units by mouth daily.   Yes [provider]  latanoprost (XALATAN) 0.005 % ophthalmic solution Place 1 drop into both eyes at bedtime. Patient not taking: Reported on 08/09/2022    [provider]    Current Facility-Administered Medications  Medication Dose Route Frequency Provider Last Rate Last Admin   0.9 %  sodium chloride infusion   Intravenous Continuous Mansy, Jan A, MD 100 mL/hr at 08/10/22 0002 New Bag at 08/10/22 0002   acetaminophen (TYLENOL) tablet 650 mg  650 mg Oral Q6H PRN Mansy, Jan A, MD       Or    acetaminophen (TYLENOL) suppository 650 mg  650 mg Rectal Q6H PRN Mansy, Jan A, MD       cefTRIAXone (ROCEPHIN) 2 g in sodium chloride 0.9 % 100 mL IVPB  2 g Intravenous Daily Mansy, Jan A, MD 200 mL/hr at 08/10/22 0422 2 g at 08/10/22 0422   magnesium hydroxide (MILK OF MAGNESIA) suspension 30 mL  30 mL Oral Daily PRN Mansy, Jan A, MD       ondansetron [facility]) tablet 4 mg  4 mg Oral Q6H PRN Mansy, Jan A, MD       Or   ondansetron [facility]) injection 4 mg  4 mg Intravenous Q6H PRN Mansy, Jan A, MD       pantoprozole (PROTONIX) 80 mg /NS 100 mL infusion  8 mg/hr Intravenous Continuous Jacalyn Lefevre, MD 10 mL/hr at 08/10/22 0025 8 mg/hr at 08/10/22 0025   potassium chloride (KLOR-CON) packet 40 mEq  40  mEq Oral BID Maurilio Lovely D, DO       traZODone (DESYREL) tablet 25 mg  25 mg Oral QHS PRN Mansy, Jan A, MD   25 mg at 08/10/22 0011    Allergies as of 08/09/2022 - Review Complete 08/09/2022  Allergen Reaction Noted   Augmentin [amoxicillin-pot clavulanate] Nausea And Vomiting 09/12/2012   Black cohosh Nausea And Vomiting 08/30/2010   Codeine Nausea And Vomiting 10/02/2015   Hyoscyamine Other (See Comments) 09/12/2012   Oxybutynin chloride Other (See Comments) 09/12/2012   Minocycline Other (See Comments) 06/07/2014    Family History  Problem Relation Age of Onset   Emphysema Mother        smoker   Heart disease Mother    COPD Mother    Colon polyps Mother    Emphysema Father        smoker   Heart disease Father    Breast cancer Sister    Colon cancer Neg Hx    Esophageal cancer Neg Hx    Stomach cancer Neg Hx    Rectal cancer Neg Hx     Social History   Socioeconomic History   Marital status: Single    Spouse name: Not on file   Number of children: Not on file   Years of education: Not on file   Highest education level: Not on file  Occupational History   Occupation: retired    Associate Professor: [employer]  Tobacco Use   Smoking status: Never   Smokeless tobacco: Never   Vaping Use   Vaping Use: Never used  Substance and Sexual Activity   Alcohol use: No    Alcohol/week: 0.0 standard drinks of alcohol   Drug use: No   Sexual activity: Not on file  Other Topics Concern   Not on file  Social History Narrative   Not on file   Social Determinants of Health   Financial Resource Strain: Not on file  Food Insecurity: No Food Insecurity (08/10/2022)   Hunger Vital Sign    Worried About Running Out of Food in the Last Year: Never true    Ran Out of Food in the Last Year: Never true  Transportation Needs: No Transportation Needs (08/10/2022)   PRAPARE - Administrator, Civil Service (Medical): No    Lack of Transportation (Non-Medical): No  Physical Activity: Not on file  Stress: Not on file  Social Connections: Not on file  Intimate Partner Violence: Not At Risk (08/10/2022)   Humiliation, Afraid, Rape, and Kick questionnaire    Fear of Current or Ex-Partner: No    Emotionally Abused: No    Physically Abused: No    Sexually Abused: No    Review of Systems: Gen: Denies fever, sweats or chills. No weight loss.  CV: Denies chest pain, palpitations or edema. Resp: Denies cough, shortness of breath of hemoptysis.  GI: See HPI. GU : Denies urinary burning, blood in urine, increased urinary frequency or incontinence. MS: Denies joint pain, muscles aches or weakness. Derm: Denies rash, itchiness, skin lesions or unhealing ulcers. Psych: Denies depression, anxiety, memory loss or confusion. Heme: Denies easy bruising, bleeding. Neuro:  Denies headaches, dizziness or paresthesias. Endo:  Denies any problems with DM, thyroid or adrenal function.  Physical Exam: Vital signs in last 24 hours: Temp:  [97.6 F (36.4 C)-98.4 F (36.9 C)] 97.6 F (36.4 C) (06/21 0505) Pulse Rate:  [66-76] 66 (06/21 0505) Resp:  [12-19] 18 (06/21 0505) BP: (102-150)/(46-104) 102/46 (06/21 0505) SpO2:  [  95 %-100 %] 97 % (06/21 0505) Weight:  [56.7 kg] 56.7 kg  (06/20 1946)   General:  Alert, well-developed, well-nourished, pleasant and cooperative in NAD. Head:  Normocephalic and atraumatic. Eyes:  No scleral icterus. Conjunctiva pink. Ears:  Normal auditory acuity. Nose:  No deformity, discharge or lesions. Mouth:  Dentition intact. No ulcers or lesions.  Neck:  Supple. No lymphadenopathy or thyromegaly.  Lungs: Breath sounds clear throughout. No wheezes, rhonchi or crackles.  Heart: Regular rate and rhythm, no murmurs. Abdomen: Soft, nondistended.  Mild tenderness above the umbilicus without rebound or guarding.  Positive bowel sounds all 4 quadrants.  No bruit.  No palpable mass. Rectal: Deferred. Musculoskeletal:  Symmetrical without gross deformities.  Pulses:  Normal pulses noted. Extremities:  Without clubbing or edema. Neurologic:  Alert and  oriented x 4. No focal deficits.  Skin:  Intact without significant lesions or rashes. Psych:  Alert and cooperative. Normal mood and affect.  Intake/Output from previous day: 06/20 0701 - 06/21 0700 In: 1623.6 [I.V.:473.6; IV Piggyback:1150] Out: -  Intake/Output this shift: No intake/output data recorded.  Lab Results: Recent Labs    08/09/22 2013 08/10/22 0548  WBC 9.4 6.4  HGB 9.3* 8.4*  HCT 29.6* 26.9*  PLT 322 271   BMET Recent Labs    08/09/22 2013 08/10/22 0548  NA 134* 139  K 3.3* 3.3*  CL 101 109  CO2 23 25  GLUCOSE 107* 89  BUN 15 11  CREATININE 0.95 0.85  CALCIUM 8.5* 8.1*   LFT Recent Labs    08/09/22 2013  PROT 6.4*  ALBUMIN 3.0*  AST 17  ALT 13  ALKPHOS 88  BILITOT 0.3   PT/INR No results for input(s): "LABPROT", "INR" in the last 72 hours. Hepatitis Panel No results for input(s): "HEPBSAG", "HCVAB", "HEPAIGM", "HEPBIGM" in the last 72 hours.    Studies/Results: CT ABDOMEN PELVIS W CONTRAST  Result Date: 08/09/2022 CLINICAL DATA:  Generalized upper abdominal discomfort. EXAM: CT ABDOMEN AND PELVIS WITH CONTRAST TECHNIQUE: Multidetector CT  imaging of the abdomen and pelvis was performed using the standard protocol following bolus administration of intravenous contrast. RADIATION DOSE REDUCTION: This exam was performed according to the departmental dose-optimization program which includes automated exposure control, adjustment of the mA and/or kV according to patient size and/or use of iterative reconstruction technique. CONTRAST:  75mL OMNIPAQUE IOHEXOL 350 MG/ML SOLN COMPARISON:  12/09/2007. FINDINGS: Lower chest: Emphysematous changes are noted at the lung bases. There tree-in-bud nodular opacities and bronchiectasis in the left lower lobe, likely infectious or inflammatory. Hepatobiliary: No focal liver abnormality is seen. Stones are present within the gallbladder. No biliary ductal dilatation. Pancreas: A 1.4 cm hypodensity is noted in the tail of the pancreas, axial image 23. No biliary ductal dilatation or surrounding fat stranding. Spleen: Normal in size without focal abnormality. Adrenals/Urinary Tract: The adrenal glands are within normal limits. The kidneys enhance symmetrically. No renal calculus or hydronephrosis. The bladder is unremarkable. Stomach/Bowel: There is gastric wall thickening with surrounding fat stranding and edema, most pronounced at the gastric antrum with possible ulcerations. There is mild thickening of the walls of the proximal duodenum with associated fat stranding. No bowel obstruction, free air, or pneumatosis. A few scattered diverticula are noted along the colon without evidence of diverticulitis. The appendix is not seen. Vascular/Lymphatic: Aortic atherosclerosis. No enlarged abdominal or pelvic lymph nodes. Reproductive: Status post hysterectomy. No adnexal masses. Other: No abdominopelvic ascites. A small fat containing umbilical hernia is present. Musculoskeletal: Degenerative changes  are present in the thoracolumbar spine. No acute osseous abnormality is seen. IMPRESSION: 1. Gastric wall thickening with  surrounding fat stranding and edema, most pronounced at the gastric antrum with possible ulcerations. There is also thickening of the walls of the proximal duodenum. Findings are suggestive of gastritis/duodenitis. Endoscopy is recommended for further evaluation on follow-up. 2. Cholelithiasis. 3. Diverticulosis without diverticulitis. 4. Tree-in-bud nodular densities in the left lower lobe, likely infectious or inflammatory. 5. Emphysema. 6. Aortic atherosclerosis. Electronically Signed   By: Thornell Sartorius M.D.   On: 08/09/2022 22:27    IMPRESSION/PLAN:  82 year old female previously admitted to the hospital with upper GI bleed/coffee-ground emesis. EGD 07/24/2022 showed a benign-appearing esophageal stenosis, nonbleeding gastric ulcers and a large duodenal bulb ulcer with pigmented material. Gastric biopsies were negative for H. pylori. Prescribed PPI twice daily. Readmitted to the hospital 08/09/2022 with recurrent abdominal pain. On PPI infusion. Received Famotidine 20mg  IV x 1. Restarted Plavix 6/12 and last dose was on 6/19.  No further black stools.  -Continue PPI infusion -Clear liquid diet -IV fluids per the hospitalist  -Ondansetron 4 mg p.o. or IV every 6 hours. -Add Carafate po Q AC and at bedtime -No plans for repeat EGD at this time -Await further recommendations per Dr. Leonides Schanz   Readmitted to the hospital 08/09/2022 with supraumbilical abdominal pain.  Normal LFTs.  Lipase 265.  CTAP showed a 1.4 cm hypodensity in the tail the pancreas without biliary ductal dilatation. -Add lipase level to a.m. lab draw -Consider eventual abdominal MRI -Pain management per the hospitalist  Normocytic anemia. No overt GI bleeding. Hg 9.3 -> 8.4.  -CBC and iron panel in a.m. -Monitor patient closely for GI bleeding  UTI, on Rocephin IV  CT identified There tree-in-bud nodular opacities and bronchiectasis in the left lower lobe, likely infectious or inflammatory. -Management per the  hospitalist  CAD s/p STEMI, s/p stent placement 10/2021. Off ASA. Last took Plavix at 8 am on 08/08/2022  Arnaldo Natal  08/10/2022, 9:39AM

## 2022-08-10 NOTE — Assessment & Plan Note (Signed)
-   We will continue her Toprol-XL. 

## 2022-08-10 NOTE — Assessment & Plan Note (Addendum)
-   This is associated with acute duodenitis.  The patient has recent gastric and duodenal ulcers on EGD 07/24/2022. - This is likely the main culprit for her epigastric abdominal discomfort. - She has elevated lipase level that is likely secondary to her gastritis and duodenitis.  I doubt pancreatitis in her case but this is certainly a possibility as well. - We will hold off Plavix. - We will keep her n.p.o. except for medications. - She will be hydrated with IV normal saline. - We will continue her on IV Protonix drip. - Dr. Rhea Belton was notified about the patient.

## 2022-08-10 NOTE — Progress Notes (Signed)
PROGRESS NOTE    [name]  ZOX:[id number] DOB: 1940/04/10 DOA: 08/09/2022 PCP: Mila Palmer, MD   Brief Narrative:    [name] is a 82 y.o. female with medical history significant for COPD, depression, hypertension, hypothyroidism, left bundle branch block, coronary artery disease status post PCI and stent, RLS as well as peptic ulcer disease with recent admission for GI bleeding from duodenal ulcer with associated nonbleeding gastric ulcers from 6/2 till 6/5.  She was told to stop Mobic and take Protonix twice daily and to hold off aspirin.  She has been compliant with instructions.  She continues to have epigastric abdominal discomfort resolved without heartburn or reflux or melena or hematemesis.  GI following and considering EGD.  Assessment & Plan:   Principal Problem:   Acute gastritis without bleeding Active Problems:   Acute lower UTI   Hypokalemia   Essential hypertension   Hypothyroidism   Depression  Assessment and Plan:  Acute gastritis without bleeding - This is associated with acute duodenitis.  The patient has recent gastric and duodenal ulcers on EGD 07/24/2022. - This is likely the main culprit for her epigastric abdominal discomfort. - She has elevated lipase level that is likely secondary to her gastritis and duodenitis.  I doubt pancreatitis in her case but this is certainly a possibility as well. - We will hold off Plavix. - We will keep her n.p.o. except for medications. - She will be hydrated with IV normal saline. - We will continue her on IV Protonix drip. - GI following and considering EGD versus initiation of Carafate   Acute lower UTI - She will be placed on IV Rocephin and will follow urine culture and sensitivity.   Hypokalemia - We will replace potassium and follow. - We will check magnesium level.   Essential hypertension - We will continue her Toprol-XL.   Depression - We will continue Effexor XR.    Hypothyroidism - We will continue levothyroxine.  DVT prophylaxis:SCDs Code Status: Full Family Communication: None at bedside Disposition Plan:  Status is: Observation The patient will require care spanning > 2 midnights and should be moved to inpatient because: Patient requires ongoing IV medications.   Consultants:  GI  Procedures:  None  Antimicrobials:  Anti-infectives (From admission, onward)    Start     Dose/Rate Route Frequency Ordered Stop   08/10/22 0400  cefTRIAXone (ROCEPHIN) 2 g in sodium chloride 0.9 % 100 mL IVPB        2 g 200 mL/hr over 30 Minutes Intravenous Daily 08/10/22 0316         Subjective: Patient seen and evaluated today with no new acute complaints or concerns. No acute concerns or events noted overnight.  She states that her abdominal pain is slightly improved this morning, denies any nausea or vomiting.  Objective: Vitals:   08/09/22 2300 08/09/22 2315 08/10/22 0002 08/10/22 0505  BP: 135/63 132/75 139/66 (!) 102/46  Pulse: 68 69 73 66  Resp: 16 15 18 18   Temp:    97.6 F (36.4 C)  TempSrc:    Oral  SpO2: 100% 100% 99% 97%  Weight:      Height:        Intake/Output Summary (Last 24 hours) at 08/10/2022 0713 Last data filed at 08/10/2022 0446 Gross per 24 hour  Intake 1623.6 ml  Output --  Net 1623.6 ml   Filed Weights   08/09/22 1946  Weight: 56.7 kg    Examination:  General exam:  Appears calm and comfortable  Respiratory system: Clear to auscultation. Respiratory effort normal. Cardiovascular system: S1 & S2 heard, RRR.  Gastrointestinal system: Abdomen is soft Central nervous system: Alert and awake Extremities: No edema Skin: No significant lesions noted Psychiatry: Flat affect.    Data Reviewed: I have personally reviewed following labs and imaging studies  CBC: Recent Labs  Lab 08/09/22 2013 08/10/22 0548  WBC 9.4 6.4  NEUTROABS 7.5  --   HGB 9.3* 8.4*  HCT 29.6* 26.9*  MCV 95.8 98.9  PLT 322 271    Basic Metabolic Panel: Recent Labs  Lab 08/09/22 2013 08/10/22 0548  NA 134* 139  K 3.3* 3.3*  CL 101 109  CO2 23 25  GLUCOSE 107* 89  BUN 15 11  CREATININE 0.95 0.85  CALCIUM 8.5* 8.1*   GFR: Estimated Creatinine Clearance: 44.9 mL/min (by C-G formula based on SCr of 0.85 mg/dL). Liver Function Tests: Recent Labs  Lab 08/09/22 2013  AST 17  ALT 13  ALKPHOS 88  BILITOT 0.3  PROT 6.4*  ALBUMIN 3.0*   Recent Labs  Lab 08/09/22 2013  LIPASE 265*   No results for input(s): "AMMONIA" in the last 168 hours. Coagulation Profile: No results for input(s): "INR", "PROTIME" in the last 168 hours. Cardiac Enzymes: No results for input(s): "CKTOTAL", "CKMB", "CKMBINDEX", "TROPONINI" in the last 168 hours. BNP (last 3 results) No results for input(s): "PROBNP" in the last 8760 hours. HbA1C: No results for input(s): "HGBA1C" in the last 72 hours. CBG: No results for input(s): "GLUCAP" in the last 168 hours. Lipid Profile: No results for input(s): "CHOL", "HDL", "LDLCALC", "TRIG", "CHOLHDL", "LDLDIRECT" in the last 72 hours. Thyroid Function Tests: No results for input(s): "TSH", "T4TOTAL", "FREET4", "T3FREE", "THYROIDAB" in the last 72 hours. Anemia Panel: No results for input(s): "VITAMINB12", "FOLATE", "FERRITIN", "TIBC", "IRON", "RETICCTPCT" in the last 72 hours. Sepsis Labs: No results for input(s): "PROCALCITON", "LATICACIDVEN" in the last 168 hours.  No results found for this or any previous visit (from the past 240 hour(s)).       Radiology Studies: CT ABDOMEN PELVIS W CONTRAST  Result Date: 08/09/2022 CLINICAL DATA:  Generalized upper abdominal discomfort. EXAM: CT ABDOMEN AND PELVIS WITH CONTRAST TECHNIQUE: Multidetector CT imaging of the abdomen and pelvis was performed using the standard protocol following bolus administration of intravenous contrast. RADIATION DOSE REDUCTION: This exam was performed according to the departmental dose-optimization program  which includes automated exposure control, adjustment of the mA and/or kV according to patient size and/or use of iterative reconstruction technique. CONTRAST:  75mL OMNIPAQUE IOHEXOL 350 MG/ML SOLN COMPARISON:  12/09/2007. FINDINGS: Lower chest: Emphysematous changes are noted at the lung bases. There tree-in-bud nodular opacities and bronchiectasis in the left lower lobe, likely infectious or inflammatory. Hepatobiliary: No focal liver abnormality is seen. Stones are present within the gallbladder. No biliary ductal dilatation. Pancreas: A 1.4 cm hypodensity is noted in the tail of the pancreas, axial image 23. No biliary ductal dilatation or surrounding fat stranding. Spleen: Normal in size without focal abnormality. Adrenals/Urinary Tract: The adrenal glands are within normal limits. The kidneys enhance symmetrically. No renal calculus or hydronephrosis. The bladder is unremarkable. Stomach/Bowel: There is gastric wall thickening with surrounding fat stranding and edema, most pronounced at the gastric antrum with possible ulcerations. There is mild thickening of the walls of the proximal duodenum with associated fat stranding. No bowel obstruction, free air, or pneumatosis. A few scattered diverticula are noted along the colon without evidence of diverticulitis. The  appendix is not seen. Vascular/Lymphatic: Aortic atherosclerosis. No enlarged abdominal or pelvic lymph nodes. Reproductive: Status post hysterectomy. No adnexal masses. Other: No abdominopelvic ascites. A small fat containing umbilical hernia is present. Musculoskeletal: Degenerative changes are present in the thoracolumbar spine. No acute osseous abnormality is seen. IMPRESSION: 1. Gastric wall thickening with surrounding fat stranding and edema, most pronounced at the gastric antrum with possible ulcerations. There is also thickening of the walls of the proximal duodenum. Findings are suggestive of gastritis/duodenitis. Endoscopy is recommended  for further evaluation on follow-up. 2. Cholelithiasis. 3. Diverticulosis without diverticulitis. 4. Tree-in-bud nodular densities in the left lower lobe, likely infectious or inflammatory. 5. Emphysema. 6. Aortic atherosclerosis. Electronically Signed   By: Thornell Sartorius M.D.   On: 08/09/2022 22:27        Scheduled Meds:  potassium chloride  40 mEq Oral BID   Continuous Infusions:  sodium chloride 100 mL/hr at 08/10/22 0002   cefTRIAXone (ROCEPHIN)  IV 2 g (08/10/22 0422)   pantoprazole 8 mg/hr (08/10/22 0025)     LOS: 0 days    Time spent: 35 minutes    Baleigh Rennaker Hoover Brunette, DO [facility]  If 7PM-7AM, please contact night-coverage [email or web address] 08/10/2022, 7:13 AM

## 2022-08-11 DIAGNOSIS — K2900 Acute gastritis without bleeding: Secondary | ICD-10-CM | POA: Diagnosis not present

## 2022-08-11 LAB — CBC
HCT: 28.9 % — ABNORMAL LOW (ref 36.0–46.0)
Hemoglobin: 9 g/dL — ABNORMAL LOW (ref 12.0–15.0)
MCH: 29.8 pg (ref 26.0–34.0)
MCHC: 31.1 g/dL (ref 30.0–36.0)
MCV: 95.7 fL (ref 80.0–100.0)
Platelets: 289 10*3/uL (ref 150–400)
RBC: 3.02 MIL/uL — ABNORMAL LOW (ref 3.87–5.11)
RDW: 15.1 % (ref 11.5–15.5)
WBC: 5.7 10*3/uL (ref 4.0–10.5)
nRBC: 0 % (ref 0.0–0.2)

## 2022-08-11 LAB — COMPREHENSIVE METABOLIC PANEL
ALT: 13 U/L (ref 0–44)
AST: 17 U/L (ref 15–41)
Albumin: 2.8 g/dL — ABNORMAL LOW (ref 3.5–5.0)
Alkaline Phosphatase: 79 U/L (ref 38–126)
Anion gap: 8 (ref 5–15)
BUN: <5 mg/dL — ABNORMAL LOW (ref 8–23)
CO2: 23 mmol/L (ref 22–32)
Calcium: 8.7 mg/dL — ABNORMAL LOW (ref 8.9–10.3)
Chloride: 106 mmol/L (ref 98–111)
Creatinine, Ser: 0.63 mg/dL (ref 0.44–1.00)
GFR, Estimated: >60 mL/min (ref >60)
Glucose, Bld: 99 mg/dL (ref 70–99)
Potassium: 4 mmol/L (ref 3.5–5.1)
Sodium: 137 mmol/L (ref 135–145)
Total Bilirubin: 0.5 mg/dL (ref 0.3–1.2)
Total Protein: 6.2 g/dL — ABNORMAL LOW (ref 6.5–8.1)

## 2022-08-11 LAB — MAGNESIUM: Magnesium: 2 mg/dL (ref 1.7–2.4)

## 2022-08-11 MED ORDER — FOSFOMYCIN TROMETHAMINE 3 G PO PACK
3.0000 g | PACK | Freq: Once | ORAL | Status: AC
  Administered 2022-08-11: 3 g via ORAL
  Filled 2022-08-11: qty 3

## 2022-08-11 MED ORDER — SUCRALFATE 1 GM/10ML PO SUSP: 1.0000 g | Freq: Three times a day (TID) | ORAL | 0 refills | Status: DC

## 2022-08-11 NOTE — Discharge Summary (Signed)
Physician Discharge Summary  [name] JYN:[id number] DOB: 11-01-1940 DOA: 08/09/2022  PCP: Mila Palmer, MD  Admit date: 08/09/2022  Discharge date: 08/11/2022  Admitted From:Home  Disposition:  Home  Recommendations for Outpatient Follow-up:  Follow up with PCP in 1-2 weeks Continue on PPI and Carafate as prescribed Follow-up with GI as scheduled on 8/20 Continue other home meds as prior  Home Health: None  Equipment/Devices: None  Discharge Condition:Stable  CODE STATUS: Full  Diet recommendation: Heart Healthy  Brief/Interim Summary: [name] is a 82 y.o. female with medical history significant for COPD, depression, hypertension, hypothyroidism, left bundle branch block, coronary artery disease status post PCI and stent, RLS as well as peptic ulcer disease with recent admission for GI bleeding from duodenal ulcer with associated nonbleeding gastric ulcers from 6/2 till 6/5.  She was told to stop Mobic and take Protonix twice daily and to hold off aspirin.  She has been compliant with instructions.  She continues to have epigastric abdominal discomfort resolved without heartburn or reflux or melena or hematemesis.  GI had evaluated her and did not see a need for repeat endoscopy.  She was started on sucralfate along with PPI and has had improvement in her overall symptoms.  She will discharge with PPI and sucralfate at this time and follow-up with GI outpatient as scheduled.  She was also noted to have UTI and was started on Rocephin empirically.  She will be given fosfomycin prior to discharge.  Discharge Diagnoses:  Principal Problem:   Acute gastritis without bleeding Active Problems:   Acute lower UTI   Hypokalemia   Essential hypertension   Hypothyroidism   Depression   Acute gastric ulcer   Epigastric pain  Principal discharge diagnosis: Acute gastritis in the setting of gastric and duodenal ulcers with recent EGD 07/24/2022.  Acute  UTI.  Discharge Instructions  Discharge Instructions     Diet - low sodium heart healthy   Complete by: As directed    Increase activity slowly   Complete by: As directed       Allergies as of 08/11/2022       Reactions   Augmentin [amoxicillin-pot Clavulanate] Nausea And Vomiting   .Marland KitchenHas patient had a PCN reaction causing immediate rash, facial/tongue/throat swelling, SOB or lightheadedness with hypotension: No Has patient had a PCN reaction causing severe rash involving mucus membranes or skin necrosis: No Has patient had a PCN reaction that required hospitalization No Has patient had a PCN reaction occurring within the last 10 years: No If all of the above answers are "NO", then may proceed with Cephalosporin use.   Black Cohosh Nausea And Vomiting   Severe GI Upset, sweats   Codeine Nausea And Vomiting   Severe GI upset, sweats   Hyoscyamine Other (See Comments)   Cramps, made urinary symptoms worse   Oxybutynin Chloride Other (See Comments)   Cramps, made urinary symptoms worse   Minocycline Other (See Comments)   Scratchy tongue, swollen lips        Medication List     TAKE these medications    albuterol 108 (90 Base) MCG/ACT inhaler Commonly known as: VENTOLIN HFA Inhale 2 puffs into the lungs every 6 (six) hours as needed for wheezing or shortness of breath.   ascorbic acid 500 MG tablet Commonly known as: VITAMIN C Take 500 mg by mouth daily.   azelastine 0.1 % nasal spray Commonly known as: ASTELIN 1-2 puffs each nostril twice daily if needed What changed:  how much to take how to take this when to take this reasons to take this additional instructions   benzonatate 200 MG capsule Commonly known as: TESSALON Take 1 capsule (200 mg total) by mouth 3 (three) times daily as needed for cough.   Calcium/Magnesium/Zinc Tabs Take 3 tablets by mouth at bedtime.   clopidogrel 75 MG tablet Commonly known as: PLAVIX Take 1 tablet (75 mg total) by  mouth daily with breakfast.   CoQ-10 100 MG Caps Take 100 mg by mouth daily.   dorzolamide 2 % ophthalmic solution Commonly known as: TRUSOPT Place 1 drop into both eyes at bedtime.   Euthyrox 75 MCG tablet Generic drug: levothyroxine Take 75 mcg by mouth every morning.   fluticasone 50 MCG/ACT nasal spray Commonly known as: Flonase Place 2 sprays into both nostrils daily. What changed:  when to take this reasons to take this   latanoprost 0.005 % ophthalmic solution Commonly known as: XALATAN Place 1 drop into both eyes at bedtime.   metoprolol succinate 50 MG 24 hr tablet Commonly known as: TOPROL-XL Take 1 tablet (50 mg total) by mouth daily. Take with or immediately following a meal.   multivitamin tablet Take 1 tablet by mouth daily.   pantoprazole 40 MG tablet Commonly known as: PROTONIX Take 1 tablet (40 mg total) by mouth 2 (two) times daily before a meal. What changed:  when to take this reasons to take this   pyridOXINE 100 MG tablet Commonly known as: VITAMIN B6 Take 100 mg by mouth daily.   sucralfate 1 GM/10ML suspension Commonly known as: CARAFATE Take 10 mLs (1 g total) by mouth 4 (four) times daily -  with meals and at bedtime for 21 days.   Trelegy Ellipta 100-62.5-25 MCG/ACT Aepb Generic drug: Fluticasone-Umeclidin-Vilant Inhale 1 puff into the lungs daily. What changed:  when to take this reasons to take this   venlafaxine XR 37.5 MG 24 hr capsule Commonly known as: EFFEXOR-XR Take 37.5 mg by mouth daily.   Vitamin D 50 MCG (2000 UT) tablet Take 2,000 Units by mouth daily.   vitamin E 180 MG (400 UNITS) capsule Take 400 Units by mouth daily.        Follow-up Information     Mila Palmer, MD. Schedule an appointment as soon as possible for a visit in 1 week(s).   Specialty: Family Medicine Contact information: [address] [place] [address] [phone]                Allergies  Allergen  Reactions   Augmentin [Amoxicillin-Pot Clavulanate] Nausea And Vomiting    .Marland KitchenHas patient had a PCN reaction causing immediate rash, facial/tongue/throat swelling, SOB or lightheadedness with hypotension: No Has patient had a PCN reaction causing severe rash involving mucus membranes or skin necrosis: No Has patient had a PCN reaction that required hospitalization No Has patient had a PCN reaction occurring within the last 10 years: No If all of the above answers are "NO", then may proceed with Cephalosporin use.    Black Cohosh Nausea And Vomiting    Severe GI Upset, sweats   Codeine Nausea And Vomiting    Severe GI upset, sweats   Hyoscyamine Other (See Comments)    Cramps, made urinary symptoms worse   Oxybutynin Chloride Other (See Comments)    Cramps, made urinary symptoms worse   Minocycline Other (See Comments)    Scratchy tongue, swollen lips    Consultations: GI   Procedures/Studies: CT ABDOMEN  PELVIS W CONTRAST  Result Date: 08/09/2022 CLINICAL DATA:  Generalized upper abdominal discomfort. EXAM: CT ABDOMEN AND PELVIS WITH CONTRAST TECHNIQUE: Multidetector CT imaging of the abdomen and pelvis was performed using the standard protocol following bolus administration of intravenous contrast. RADIATION DOSE REDUCTION: This exam was performed according to the departmental dose-optimization program which includes automated exposure control, adjustment of the mA and/or kV according to patient size and/or use of iterative reconstruction technique. CONTRAST:  75mL OMNIPAQUE IOHEXOL 350 MG/ML SOLN COMPARISON:  12/09/2007. FINDINGS: Lower chest: Emphysematous changes are noted at the lung bases. There tree-in-bud nodular opacities and bronchiectasis in the left lower lobe, likely infectious or inflammatory. Hepatobiliary: No focal liver abnormality is seen. Stones are present within the gallbladder. No biliary ductal dilatation. Pancreas: A 1.4 cm hypodensity is noted in the tail of the  pancreas, axial image 23. No biliary ductal dilatation or surrounding fat stranding. Spleen: Normal in size without focal abnormality. Adrenals/Urinary Tract: The adrenal glands are within normal limits. The kidneys enhance symmetrically. No renal calculus or hydronephrosis. The bladder is unremarkable. Stomach/Bowel: There is gastric wall thickening with surrounding fat stranding and edema, most pronounced at the gastric antrum with possible ulcerations. There is mild thickening of the walls of the proximal duodenum with associated fat stranding. No bowel obstruction, free air, or pneumatosis. A few scattered diverticula are noted along the colon without evidence of diverticulitis. The appendix is not seen. Vascular/Lymphatic: Aortic atherosclerosis. No enlarged abdominal or pelvic lymph nodes. Reproductive: Status post hysterectomy. No adnexal masses. Other: No abdominopelvic ascites. A small fat containing umbilical hernia is present. Musculoskeletal: Degenerative changes are present in the thoracolumbar spine. No acute osseous abnormality is seen. IMPRESSION: 1. Gastric wall thickening with surrounding fat stranding and edema, most pronounced at the gastric antrum with possible ulcerations. There is also thickening of the walls of the proximal duodenum. Findings are suggestive of gastritis/duodenitis. Endoscopy is recommended for further evaluation on follow-up. 2. Cholelithiasis. 3. Diverticulosis without diverticulitis. 4. Tree-in-bud nodular densities in the left lower lobe, likely infectious or inflammatory. 5. Emphysema. 6. Aortic atherosclerosis. Electronically Signed   By: Thornell Sartorius M.D.   On: 08/09/2022 22:27   ECHOCARDIOGRAM COMPLETE  Result Date: 08/07/2022    ECHOCARDIOGRAM REPORT   Patient Name:   MIKAL WISMAN Date of Exam: 08/07/2022 Medical Rec #:  [id number]            Height:       67.0 in Accession #:    [id number]           Weight:       127.6 lb Date of Birth:  [birth year]              BSA:          1.671 m Patient Age:    82 years             BP:           134/64 mmHg Patient Gender: F                    HR:           72 bpm. Exam Location:  [address] Procedure: 2D Echo, 3D Echo, Cardiac Doppler and Color Doppler Indications:    I25.10 CAD  History:        Patient has prior history of Echocardiogram examinations, most                 recent 10/22/2021.  CAD and Previous Myocardial Infarction, COPD,                 Arrythmias:LBBB; Risk Factors:Family History of Coronary Artery                 Disease and Hypertension. History of Breast Cancer (2021 with                 Right and Left Lumpectomies, and Radiation), Ischemic                 Cardiomyopathy (EF 45-50%).  Sonographer:    Farrel Conners RDCS Referring Phys: Parke Poisson IMPRESSIONS  1. Technically difficult study, recommend limited echo with contrast to better evaluate LV systolic function. Left ventricular ejection fraction, by estimation, is 40 to 45%. The left ventricle has mildly decreased function. Left ventricular endocardial  border not optimally defined to evaluate regional wall motion. Left ventricular diastolic parameters are indeterminate.  2. Right ventricular systolic function is normal. The right ventricular size is normal. There is normal pulmonary artery systolic pressure. The estimated right ventricular systolic pressure is 31.1 mmHg.  3. The mitral valve is normal in structure. Trivial mitral valve regurgitation. No evidence of mitral stenosis.  4. The aortic valve is tricuspid. Aortic valve regurgitation is mild. No aortic stenosis is present.  5. The inferior vena cava is normal in size with greater than 50% respiratory variability, suggesting right atrial pressure of 3 mmHg. FINDINGS  Left Ventricle: Left ventricular ejection fraction, by estimation, is 40 to 45%. The left ventricle has mildly decreased function. Left ventricular endocardial border not optimally defined to evaluate regional wall motion.  The left ventricular internal cavity size was normal in size. There is no left ventricular hypertrophy. Left ventricular diastolic parameters are indeterminate. Right Ventricle: The right ventricular size is normal. No increase in right ventricular wall thickness. Right ventricular systolic function is normal. There is normal pulmonary artery systolic pressure. The tricuspid regurgitant velocity is 2.65 m/s, and  with an assumed right atrial pressure of 3 mmHg, the estimated right ventricular systolic pressure is 31.1 mmHg. Left Atrium: Left atrial size was normal in size. Right Atrium: Right atrial size was normal in size. Pericardium: There is no evidence of pericardial effusion. Mitral Valve: The mitral valve is normal in structure. Trivial mitral valve regurgitation. No evidence of mitral valve stenosis. Tricuspid Valve: The tricuspid valve is normal in structure. Tricuspid valve regurgitation is trivial. Aortic Valve: The aortic valve is tricuspid. Aortic valve regurgitation is mild. Aortic regurgitation PHT measures 435 msec. No aortic stenosis is present. Pulmonic Valve: The pulmonic valve was not well visualized. Pulmonic valve regurgitation is trivial. Aorta: The aortic root and ascending aorta are structurally normal, with no evidence of dilitation. Venous: The inferior vena cava is normal in size with greater than 50% respiratory variability, suggesting right atrial pressure of 3 mmHg. IAS/Shunts: The interatrial septum was not well visualized.  LEFT VENTRICLE PLAX 2D LVIDd:         4.50 cm   Diastology LVIDs:         3.10 cm   LV e' medial:    5.00 cm/s LV PW:         0.80 cm   LV E/e' medial:  14.1 LV IVS:        1.00 cm   LV e' lateral:   10.60 cm/s LVOT diam:     2.00 cm   LV E/e' lateral: 6.7 LV SV:  39 LV SV Index:   23 LVOT Area:     3.14 cm  RIGHT VENTRICLE RV Basal diam:  2.20 cm RV S prime:     7.72 cm/s TAPSE (M-mode): 1.2 cm RVSP:           31.1 mmHg LEFT ATRIUM           Index         RIGHT ATRIUM           Index LA diam:      3.00 cm 1.80 cm/m   RA Pressure: 3.00 mmHg LA Vol (A2C): 36.9 ml 22.09 ml/m  RA Area:     6.00 cm LA Vol (A4C): 21.1 ml 12.63 ml/m  RA Volume:   10.40 ml  6.23 ml/m  AORTIC VALVE LVOT Vmax:   65.80 cm/s LVOT Vmean:  41.400 cm/s LVOT VTI:    0.124 m AI PHT:      435 msec  AORTA Ao Root diam: 3.00 cm Ao Asc diam:  3.00 cm MITRAL VALVE               TRICUSPID VALVE MV Area (PHT): cm         TR Peak grad:   28.1 mmHg MV Decel Time: 208 msec    TR Vmax:        265.00 cm/s MV E velocity: 70.75 cm/s  Estimated RAP:  3.00 mmHg MV A velocity: 90.15 cm/s  RVSP:           31.1 mmHg MV E/A ratio:  0.78                            SHUNTS                            Systemic VTI:  0.12 m                            Systemic Diam: 2.00 cm Epifanio Lesches MD Electronically signed by Epifanio Lesches MD Signature Date/Time: 08/07/2022/8:39:05 PM    Final    DG Chest 2 View  Result Date: 07/22/2022 CLINICAL DATA:  Emesis, tachycardia, pain EXAM: CHEST - 2 VIEW COMPARISON:  10/21/2021 FINDINGS: Cardiac and mediastinal contours are within normal limits. Similar densities in the right perihilar region, likely scarring. No new focal pulmonary opacity. No pleural effusion or pneumothorax. No acute osseous abnormality. IMPRESSION: No acute cardiopulmonary process. Electronically Signed   By: Wiliam Ke M.D.   On: 07/22/2022 23:11   CT ANGIO GI BLEED  Result Date: 07/22/2022 CLINICAL DATA:  Abdominal pain.  Concern for upper GI bleeding. EXAM: CTA ABDOMEN AND PELVIS WITHOUT AND WITH CONTRAST TECHNIQUE: Multidetector CT imaging of the abdomen and pelvis was performed using the standard protocol during bolus administration of intravenous contrast. Multiplanar reconstructed images and MIPs were obtained and reviewed to evaluate the vascular anatomy. RADIATION DOSE REDUCTION: This exam was performed according to the departmental dose-optimization program which includes automated  exposure control, adjustment of the mA and/or kV according to patient size and/or use of iterative reconstruction technique. CONTRAST:  80mL OMNIPAQUE IOHEXOL 350 MG/ML SOLN COMPARISON:  None Available. FINDINGS: VASCULAR Aorta: Normal caliber aorta without aneurysm, dissection, vasculitis or significant stenosis. Moderate calcified atherosclerotic plaques are present. Celiac: Patent without evidence of aneurysm, dissection, vasculitis or significant stenosis. SMA: Patent without evidence of aneurysm,  dissection, vasculitis or significant stenosis. Hepatic artery arises from the SMA. Renals: Both renal arteries are patent without evidence of aneurysm, dissection, vasculitis, fibromuscular dysplasia or significant stenosis. IMA: Patent without evidence of aneurysm, dissection, vasculitis or significant stenosis. Inflow: Patent without evidence of aneurysm, dissection, vasculitis or significant stenosis. Proximal Outflow: Bilateral common femoral and visualized portions of the superficial and profunda femoral arteries are patent without evidence of aneurysm, dissection, vasculitis or significant stenosis. Veins: No obvious venous abnormality within the limitations of this arterial phase study. Review of the MIP images confirms the above findings. NON-VASCULAR Lower chest: There are tree-in-bud opacities in the left lower lobe compatible with infectious/inflammatory process. Hepatobiliary: Gallstones are present. There is no biliary ductal dilatation. The liver is within normal limits. Pancreas: Unremarkable. No pancreatic ductal dilatation or surrounding inflammatory changes. Spleen: Normal in size without focal abnormality. Adrenals/Urinary Tract: Adrenal glands are unremarkable. Kidneys are normal, without renal calculi, focal lesion, or hydronephrosis. Bladder is unremarkable. Stomach/Bowel: There is wall thickening and inflammation of the gastric antrum. The stomach is distended with a large amount of fluid. There  is no free air or pneumatosis. The appendix is not visualized. No active gastrointestinal bleeding identified. There is sigmoid colon diverticulosis. Small bowel loops are within normal limits. Lymphatic: No enlarged lymph nodes are seen. Reproductive: Status post hysterectomy. No adnexal masses. Other: Small fat containing umbilical hernia present.  No ascites. Musculoskeletal: Degenerative changes affect the spine. IMPRESSION: VASCULAR 1. No evidence for active gastrointestinal bleeding. 2. No acute vascular pathology. NON-VASCULAR 1. Wall thickening and inflammation of the gastric antrum worrisome for gastritis or ulcer disease. No free air. The stomach is distended with fluid. 2. Cholelithiasis. 3. Sigmoid colon diverticulosis. 4. Tree-in-bud opacities in the left lower lobe compatible with infectious/inflammatory process. Electronically Signed   By: Darliss Cheney M.D.   On: 07/22/2022 23:07     Discharge Exam: Vitals:   08/11/22 0548 08/11/22 0753  BP: 134/68 137/79  Pulse: 71 87  Resp: 17 18  Temp: 97.8 F (36.6 C) 98 F (36.7 C)  SpO2: 96% 100%   Vitals:   08/10/22 1736 08/10/22 2132 08/11/22 0548 08/11/22 0753  BP: (!) 150/64 (!) 149/61 134/68 137/79  Pulse: 91 75 71 87  Resp: 16 17 17 18   Temp: 98 F (36.7 C) 97.8 F (36.6 C) 97.8 F (36.6 C) 98 F (36.7 C)  TempSrc:  Oral Oral Oral  SpO2: 100% 98% 96% 100%  Weight:      Height:        General: Pt is alert, awake, not in acute distress Cardiovascular: RRR, S1/S2 +, no rubs, no gallops Respiratory: CTA bilaterally, no wheezing, no rhonchi Abdominal: Soft, NT, ND, bowel sounds + Extremities: no edema, no cyanosis    The results of significant diagnostics from this hospitalization (including imaging, microbiology, ancillary and laboratory) are listed below for reference.     Microbiology: No results found for this or any previous visit (from the past 240 hour(s)).   Labs: BNP (last 3 results) No results for  input(s): "BNP" in the last 8760 hours. Basic Metabolic Panel: Recent Labs  Lab 08/09/22 2013 08/10/22 0548 08/11/22 0050  NA 134* 139 137  K 3.3* 3.3* 4.0  CL 101 109 106  CO2 23 25 23   GLUCOSE 107* 89 99  BUN 15 11 <5*  CREATININE 0.95 0.85 0.63  CALCIUM 8.5* 8.1* 8.7*  MG  --   --  2.0   Liver Function Tests: Recent Labs  Lab 08/09/22 2013 08/11/22 0050  AST 17 17  ALT 13 13  ALKPHOS 88 79  BILITOT 0.3 0.5  PROT 6.4* 6.2*  ALBUMIN 3.0* 2.8*   Recent Labs  Lab 08/09/22 2013 08/10/22 0543  LIPASE 265* 130*   No results for input(s): "AMMONIA" in the last 168 hours. CBC: Recent Labs  Lab 08/09/22 2013 08/10/22 0548 08/11/22 0050  WBC 9.4 6.4 5.7  NEUTROABS 7.5  --   --   HGB 9.3* 8.4* 9.0*  HCT 29.6* 26.9* 28.9*  MCV 95.8 98.9 95.7  PLT 322 271 289   Cardiac Enzymes: No results for input(s): "CKTOTAL", "CKMB", "CKMBINDEX", "TROPONINI" in the last 168 hours. BNP: Invalid input(s): "POCBNP" CBG: No results for input(s): "GLUCAP" in the last 168 hours. D-Dimer No results for input(s): "DDIMER" in the last 72 hours. Hgb A1c No results for input(s): "HGBA1C" in the last 72 hours. Lipid Profile No results for input(s): "CHOL", "HDL", "LDLCALC", "TRIG", "CHOLHDL", "LDLDIRECT" in the last 72 hours. Thyroid function studies No results for input(s): "TSH", "T4TOTAL", "T3FREE", "THYROIDAB" in the last 72 hours.  Invalid input(s): "FREET3" Anemia work up No results for input(s): "VITAMINB12", "FOLATE", "FERRITIN", "TIBC", "IRON", "RETICCTPCT" in the last 72 hours. Urinalysis    Component Value Date/Time   COLORURINE AMBER (A) 08/09/2022 1959   APPEARANCEUR CLOUDY (A) 08/09/2022 1959   LABSPEC 1.017 08/09/2022 1959   PHURINE 5.0 08/09/2022 1959   GLUCOSEU NEGATIVE 08/09/2022 1959   HGBUR SMALL (A) 08/09/2022 1959   BILIRUBINUR NEGATIVE 08/09/2022 1959   KETONESUR NEGATIVE 08/09/2022 1959   PROTEINUR 30 (A) 08/09/2022 1959   UROBILINOGEN 0.2 12/08/2007  2306   NITRITE POSITIVE (A) 08/09/2022 1959   LEUKOCYTESUR MODERATE (A) 08/09/2022 1959   Sepsis Labs Recent Labs  Lab 08/09/22 2013 08/10/22 0548 08/11/22 0050  WBC 9.4 6.4 5.7   Microbiology No results found for this or any previous visit (from the past 240 hour(s)).   Time coordinating discharge: 35 minutes  SIGNED:   Erick Blinks, DO [facility] 08/11/2022, 12:12 PM  If 7PM-7AM, please contact night-coverage [email or web address]

## 2022-08-11 NOTE — Progress Notes (Signed)
PIV removed. AVS reviewed with patient and patient's son, Alycia Rossetti. Both state understanding of necessary medication regimen and follow up appointments.

## 2022-08-11 NOTE — Discharge Instructions (Signed)
Recommendations for Outpatient Follow-up:  Follow up with PCP in 1-2 weeks Continue on PPI and Carafate as prescribed Follow-up with GI as scheduled on 8/20 Continue other home meds as prior

## 2022-08-17 DIAGNOSIS — K2970 Gastritis, unspecified, without bleeding: Secondary | ICD-10-CM | POA: Diagnosis not present

## 2022-08-17 DIAGNOSIS — D649 Anemia, unspecified: Secondary | ICD-10-CM | POA: Diagnosis not present

## 2022-09-05 DIAGNOSIS — H401131 Primary open-angle glaucoma, bilateral, mild stage: Secondary | ICD-10-CM | POA: Diagnosis not present

## 2022-09-09 ENCOUNTER — Emergency Department (HOSPITAL_COMMUNITY)
Admission: EM | Admit: 2022-09-09 | Discharge: 2022-09-09 | Disposition: A | Discharge status: Home / Self Care | Payer: Medicare HMO | Attending: Emergency Medicine | Admitting: Emergency Medicine

## 2022-09-09 ENCOUNTER — Other Ambulatory Visit: Payer: Self-pay

## 2022-09-09 ENCOUNTER — Emergency Department (HOSPITAL_COMMUNITY): Payer: Medicare HMO

## 2022-09-09 DIAGNOSIS — I214 Non-ST elevation (NSTEMI) myocardial infarction: Secondary | ICD-10-CM | POA: Diagnosis not present

## 2022-09-09 DIAGNOSIS — E039 Hypothyroidism, unspecified: Secondary | ICD-10-CM | POA: Diagnosis not present

## 2022-09-09 DIAGNOSIS — R072 Precordial pain: Secondary | ICD-10-CM | POA: Diagnosis not present

## 2022-09-09 DIAGNOSIS — R079 Chest pain, unspecified: Secondary | ICD-10-CM | POA: Diagnosis not present

## 2022-09-09 DIAGNOSIS — I10 Essential (primary) hypertension: Secondary | ICD-10-CM | POA: Insufficient documentation

## 2022-09-09 DIAGNOSIS — Z853 Personal history of malignant neoplasm of breast: Secondary | ICD-10-CM | POA: Diagnosis not present

## 2022-09-09 DIAGNOSIS — I499 Cardiac arrhythmia, unspecified: Secondary | ICD-10-CM | POA: Diagnosis not present

## 2022-09-09 DIAGNOSIS — R0602 Shortness of breath: Secondary | ICD-10-CM | POA: Diagnosis not present

## 2022-09-09 DIAGNOSIS — J449 Chronic obstructive pulmonary disease, unspecified: Secondary | ICD-10-CM | POA: Insufficient documentation

## 2022-09-09 DIAGNOSIS — R0789 Other chest pain: Secondary | ICD-10-CM | POA: Diagnosis not present

## 2022-09-09 LAB — BASIC METABOLIC PANEL
Anion gap: 9 (ref 5–15)
BUN: 7 mg/dL — ABNORMAL LOW (ref 8–23)
CO2: 26 mmol/L (ref 22–32)
Calcium: 9.6 mg/dL (ref 8.9–10.3)
Chloride: 103 mmol/L (ref 98–111)
Creatinine, Ser: 0.75 mg/dL (ref 0.44–1.00)
GFR, Estimated: >60 mL/min (ref >60)
Glucose, Bld: 109 mg/dL — ABNORMAL HIGH (ref 70–99)
Potassium: 3.6 mmol/L (ref 3.5–5.1)
Sodium: 138 mmol/L (ref 135–145)

## 2022-09-09 LAB — CBC
HCT: 37.2 % (ref 36.0–46.0)
Hemoglobin: 11.2 g/dL — ABNORMAL LOW (ref 12.0–15.0)
MCH: 29.6 pg (ref 26.0–34.0)
MCHC: 30.1 g/dL (ref 30.0–36.0)
MCV: 98.2 fL (ref 80.0–100.0)
Platelets: 297 10*3/uL (ref 150–400)
RBC: 3.79 MIL/uL — ABNORMAL LOW (ref 3.87–5.11)
RDW: 14.6 % (ref 11.5–15.5)
WBC: 5.7 10*3/uL (ref 4.0–10.5)
nRBC: 0 % (ref 0.0–0.2)

## 2022-09-09 LAB — TROPONIN I (HIGH SENSITIVITY)
Troponin I (High Sensitivity): 7 ng/L (ref <18)
Troponin I (High Sensitivity): 6 ng/L (ref <18)

## 2022-09-09 NOTE — ED Provider Notes (Signed)
[place] EMERGENCY DEPARTMENT AT [facility] Provider Note   CSN: [id number] Arrival date & time: 09/09/22  1057     History  Chief Complaint  Patient presents with   Chest Pain   Shortness of Breath    [name] is a 82 y.o. female with PMH as listed below who presents with acute onset CP left sided that woke her from sleep. Rated 8/10. +SOB as well. Has had similar pain before when she was awake. Denies N/V. Unsure about diaphoresis.  Has h/o MI.  Sister at bedside states that she has a chronic memory issue and sometimes forgets to take her medicines. Has no pain now. Lasted until she went to UC who sent her to ED. Unsure about leg swelling. No h/o DVT/PE. Per chart review was admitted in June for Acute gastritis in the setting of gastric and duodenal ulcers as well as acute UTI. Doing well from that perspective. Given 324 ASA.   Past Medical History:  Diagnosis Date   Allergy    Anemia    Breast cancer (HCC)    Cataract    COPD (chronic obstructive pulmonary disease) (HCC)    Depression    Essential hypertension 11/29/2014   Glaucoma    Hemoptysis    Hypothyroidism    Intractable nausea and vomiting 07/23/2022   Left bundle branch block 03/16/2005   Oxygen deficiency    patient was using oxygen at home, her pulmonologist discontinued it and patient stopped using it on Monday Mar 20, 2015   Personal history of radiation therapy    Restless leg syndrome    Sinusitis    Vertigo        Home Medications Prior to Admission medications   Medication Sig Start Date End Date Taking? Authorizing Provider  albuterol (VENTOLIN HFA) 108 (90 Base) MCG/ACT inhaler Inhale 2 puffs into the lungs every 6 (six) hours as needed for wheezing or shortness of breath. 11/27/18   Jetty Duhamel D, MD  azelastine (ASTELIN) 0.1 % nasal spray 1-2 puffs each nostril twice daily if needed Patient taking differently: Place 1-2 sprays into both nostrils daily as needed for  rhinitis. 11/26/17   Waymon Budge, MD  benzonatate (TESSALON) 200 MG capsule Take 1 capsule (200 mg total) by mouth 3 (three) times daily as needed for cough. 05/27/20   Jetty Duhamel D, MD  Cholecalciferol (VITAMIN D) 50 MCG (2000 UT) tablet Take 2,000 Units by mouth daily.    [provider]  clopidogrel (PLAVIX) 75 MG tablet Take 1 tablet (75 mg total) by mouth daily with breakfast. 05/11/22   Parke Poisson, MD  Coenzyme Q10 (COQ-10) 100 MG CAPS Take 100 mg by mouth daily.    [provider]  dorzolamide (TRUSOPT) 2 % ophthalmic solution Place 1 drop into both eyes at bedtime.    [provider]  EUTHYROX 75 MCG tablet Take 75 mcg by mouth every morning. 10/11/21   [provider]  fluticasone (FLONASE) 50 MCG/ACT nasal spray Place 2 sprays into both nostrils daily. Patient taking differently: Place 2 sprays into both nostrils daily as needed for allergies. 07/29/13   Ginnie Smart, MD  Fluticasone-Umeclidin-Vilant (TRELEGY ELLIPTA) 100-62.5-25 MCG/ACT AEPB Inhale 1 puff into the lungs daily. Patient taking differently: Inhale 1 puff into the lungs daily as needed (sob/wheezing). 02/16/21   Young, Joni Fears D, MD  latanoprost (XALATAN) 0.005 % ophthalmic solution Place 1 drop into both eyes at bedtime. Patient not taking: Reported on  08/09/2022    [provider]  metoprolol succinate (TOPROL-XL) 50 MG 24 hr tablet Take 1 tablet (50 mg total) by mouth daily. Take with or immediately following a meal. 06/19/22   Marjie Skiff E, PA-C  Multiple Minerals (CALCIUM/MAGNESIUM/ZINC) TABS Take 3 tablets by mouth at bedtime.    [provider]  Multiple Vitamin (MULTIVITAMIN) tablet Take 1 tablet by mouth daily.    [provider]  pantoprazole (PROTONIX) 40 MG tablet Take 1 tablet (40 mg total) by mouth 2 (two) times daily before a meal. Patient taking differently: Take 40 mg by mouth daily as needed (heartburn). 07/25/22   Jonah Blue, MD  pyridOXINE (VITAMIN B6) 100 MG tablet Take 100 mg by mouth daily.    [provider]  sucralfate (CARAFATE) 1 GM/10ML suspension Take 10 mLs (1 g total) by mouth 4 (four) times daily -  with meals and at bedtime for 21 days. 08/11/22 09/01/22  Sherryll Burger, Pratik D, DO  venlafaxine XR (EFFEXOR-XR) 37.5 MG 24 hr capsule Take 37.5 mg by mouth daily. 09/28/21   [provider]  vitamin C (ASCORBIC ACID) 500 MG tablet Take 500 mg by mouth daily.    [provider]  vitamin E 400 UNIT capsule Take 400 Units by mouth daily.    [provider]      Allergies    Augmentin [amoxicillin-pot clavulanate], Black cohosh, Codeine, Hyoscyamine, Oxybutynin chloride, and Minocycline    Review of Systems   Review of Systems A 10 point review of systems was performed and is negative unless otherwise reported in HPI.  Physical Exam Updated Vital Signs BP 126/89   Pulse 61   Temp (!) 97.5 F (36.4 C) (Oral)   Resp 16   Ht 5' 7.5" (1.715 m)   Wt 59 kg   SpO2 99%   BMI 20.06 kg/m  Physical Exam General: Normal appearing female, lying in bed.  HEENT: Sclera anicteric, MMM, trachea midline.  Cardiology: RRR, no murmurs/rubs/gallops. DP pulses equal bilaterally.  Resp: Normal respiratory rate and effort. CTAB, no wheezes, rhonchi, crackles.  Abd: Soft, non-tender, non-distended. No rebound tenderness or guarding.  GU: Deferred. MSK: No peripheral edema or signs of trauma.  Skin: warm, dry.  Neuro: A&Ox4, CNs II-XII grossly intact. MAEs. Sensation grossly intact.  Psych: Normal mood and affect.   ED Results / Procedures / Treatments   Labs (all labs ordered are listed, but only abnormal results are displayed) Labs Reviewed  BASIC METABOLIC PANEL - Abnormal; Notable for the following components:      Result Value   Glucose, Bld 109 (*)    BUN 7 (*)    All other components within normal limits  CBC - Abnormal; Notable for the following components:   RBC  3.79 (*)    Hemoglobin 11.2 (*)    All other components within normal limits  D-DIMER, QUANTITATIVE (NOT AT [facility])  TROPONIN I (HIGH SENSITIVITY)  TROPONIN I (HIGH SENSITIVITY)    EKG EKG Interpretation Date/Time:  Sunday September 09 2022 11:13:43 EDT Ventricular Rate:  85 PR Interval:  162 QRS Duration:  112 QT Interval:  418 QTC Calculation: 497 R Axis:   65  Text Interpretation: Normal sinus rhythm Incomplete LBBB ST & T wave abnormalities similar to prior Confirmed by Vivi Barrack [phone]) on 09/09/2022 1:05:20 PM  Radiology DG Chest 2 View  Result Date: 09/09/2022 CLINICAL DATA:  Chest pain, shortness of breath EXAM: CHEST - 2 VIEW COMPARISON:  Chest radiograph dated  07/22/2022. FINDINGS: The heart size and mediastinal contours are within normal limits. Scarring in the right suprahilar area appears unchanged. Otherwise, both lungs are clear. Degenerative changes are seen in the spine. IMPRESSION: No active cardiopulmonary disease. Electronically Signed   By: Romona Curls M.D.   On: 09/09/2022 12:23    Procedures Procedures    Medications Ordered in ED Medications - No data to display  ED Course/ Medical Decision Making/ A&P                          Medical Decision Making Amount and/or Complexity of Data Reviewed Labs: ordered. Decision-making details documented in ED Course. Radiology: ordered.    This patient presents to the ED for concern of CP, this involves an extensive number of treatment options, and is a complaint that carries with it a high risk of complications and morbidity.  I considered the following differential and admission for this acute, potentially life threatening condition.   MDM:    DDX for chest pain includes but is not limited to:  ACS/arrhythmia,  PE, aortic dissection, GERD/PUD/gastritis, or musculoskeletal pain. Consider unstable angina vs ACS given presenting sx. EKG w known LBBB, no signs of ischemia or pericarditis. Patient cannot PERC out  based on age, but will obtain D dimer and reassess, minimal risk factors for PE. Lower c/f dissection. No abdominal pain and no c/f biliary disease. No sxs c/w PNA, esophogeal rupture. CXR shows no PNA, PTX, pulm edema, pleural effusion.    Clinical Course as of 09/09/22 1505  Sun Sep 09, 2022  1241 Troponin I (High Sensitivity): 7 Initial trop negative [HN]  1241 Basic metabolic panel(!) Unremarkable in the context of this patient's presentation  [HN]  1241 CBC(!) Unremarkable in the context of this patient's presentation  [HN]  1501 Troponin I (High Sensitivity): 6 Repeat trop neg [HN]  1501 Patient is signed out to the oncoming ED physician Dr. Posey Rea who is made aware of her history, presentation, exam, workup, and plan. Plan is to obtain dimer. If neg, can likely be DC'd w/ o/p f/u with cards  [HN]    Clinical Course User Index [HN] Loetta Rough, MD    Labs: I Ordered, and personally interpreted labs.  The pertinent results include:  those listed above  Imaging Studies ordered: I ordered imaging studies including CXR I independently visualized and interpreted imaging. I agree with the radiologist interpretation  Additional history obtained from chart review, sister at bedside.    Cardiac Monitoring: The patient was maintained on a cardiac monitor.  I personally viewed and interpreted the cardiac monitored which showed an underlying rhythm of: NSR  Reevaluation: After the interventions noted above, I reevaluated the patient and found that they have :resolved  Social Determinants of Health: Lives independently  Disposition:  Signed out  Co morbidities that complicate the patient evaluation  Past Medical History:  Diagnosis Date   Allergy    Anemia    Breast cancer (HCC)    Cataract    COPD (chronic obstructive pulmonary disease) (HCC)    Depression    Essential hypertension 11/29/2014   Glaucoma    Hemoptysis    Hypothyroidism    Intractable nausea and  vomiting 07/23/2022   Left bundle branch block 03/16/2005   Oxygen deficiency    patient was using oxygen at home, her pulmonologist discontinued it and patient stopped using it on Monday Mar 20, 2015   Personal history of radiation  therapy    Restless leg syndrome    Sinusitis    Vertigo      Medicines No orders of the defined types were placed in this encounter.   I have reviewed the patients home medicines and have made adjustments as needed  Problem List / ED Course: Problem List Items Addressed This Visit   None Visit Diagnoses     Nonspecific chest pain    -  Primary                   This note was created using dictation software, which may contain spelling or grammatical errors.    Loetta Rough, MD 09/09/22 [phone]

## 2022-09-09 NOTE — ED Notes (Signed)
D-dimer sent to lab  

## 2022-09-09 NOTE — ED Triage Notes (Addendum)
EMS stated, she has chest pain with SOB . Coming from Dr. Isidore Moos. She said her chest pain is gone now. Have some SOB .  18g IV  324 ASA

## 2022-09-09 NOTE — ED Provider Notes (Signed)
  Physical Exam  BP (!) 150/60   Pulse 70   Temp (!) 97.5 F (36.4 C) (Oral)   Resp 18   Ht 5' 7.5" (1.715 m)   Wt 59 kg   SpO2 97%   BMI 20.06 kg/m   Physical Exam Vitals and nursing note reviewed.  Constitutional:      General: She is not in acute distress.    Appearance: She is well-developed.  HENT:     Head: Normocephalic and atraumatic.  Eyes:     Conjunctiva/sclera: Conjunctivae normal.  Cardiovascular:     Rate and Rhythm: Normal rate and regular rhythm.     Heart sounds: No murmur heard. Pulmonary:     Effort: Pulmonary effort is normal. No respiratory distress.     Breath sounds: Normal breath sounds.  Abdominal:     Palpations: Abdomen is soft.     Tenderness: There is no abdominal tenderness.  Musculoskeletal:        General: No swelling.     Cervical back: Neck supple.  Skin:    General: Skin is warm and dry.     Capillary Refill: Capillary refill takes less than 2 seconds.  Neurological:     Mental Status: She is alert.  Psychiatric:        Mood and Affect: Mood normal.     Procedures  Procedures  ED Course / MDM   Clinical Course as of 09/09/22 2153  Sun Sep 09, 2022  1241 Troponin I (High Sensitivity): 7 Initial trop negative [HN]  1241 Basic metabolic panel(!) Unremarkable in the context of this patient's presentation  [HN]  1241 CBC(!) Unremarkable in the context of this patient's presentation  [HN]  1501 Troponin I (High Sensitivity): 6 Repeat trop neg [HN]  1501 Patient is signed out to the oncoming ED physician Dr. Posey Rea who is made aware of her history, presentation, exam, workup, and plan. Plan is to obtain dimer. If neg, can likely be DC'd w/ o/p f/u with cards  [HN]    Clinical Course User Index [HN] Loetta Rough, MD   Medical Decision Making Amount and/or Complexity of Data Reviewed Labs: ordered. Decision-making details documented in ED Course. Radiology: ordered.   Patient received an handoff.  Short episode of  chest pain awakening the patient from sleep with overall negative workup but pending D-dimer.  Patient had 3 D-dimers drawn all of which either hemolyzed or were lost in the lab.  I personally reevaluated the patient and she is low risk by Wells criteria, not hypoxic, not exhibiting any tachycardia, shortness of breath or other signs of PE and I have very low suspicion for PE.  Patient presentation likely secondary to her peptic ulcer disease that was extensively worked up just last month.  She remains symptom-free here in the emergency department and we had a shared decision-making discussion about pursuing CT imaging versus outpatient follow-up.  Patient would like to go home which is not unreasonable at this time.  We will defer additional D-dimer testing given low pretest probability for PE at this time and she was discharged with return precautions and close outpatient follow-up of what she and her friend voiced understanding.       Glendora Score, MD 09/09/22 2154

## 2022-10-04 ENCOUNTER — Other Ambulatory Visit: Payer: Self-pay | Admitting: Student

## 2022-10-09 ENCOUNTER — Ambulatory Visit: Payer: Medicare HMO | Admitting: Nurse Practitioner

## 2022-10-09 ENCOUNTER — Encounter: Payer: Self-pay | Admitting: Nurse Practitioner

## 2022-10-09 VITALS — BP 122/62 | HR 82 | Ht 67.0 in | Wt 122.0 lb

## 2022-10-09 DIAGNOSIS — K279 Peptic ulcer, site unspecified, unspecified as acute or chronic, without hemorrhage or perforation: Secondary | ICD-10-CM | POA: Diagnosis not present

## 2022-10-09 NOTE — Progress Notes (Signed)
Primary GI: Abigail Jupiter, MD   ASSESSMENT & PLAN   Patient is an 82 year old female with a history of hypothyroidism, hypertension, bronchiectasis, breast cancer currently on Arimidex, NSTEMI, s/p coronary artery stent placement (on Plavix), HLD, cholelithiasis, emphysema  UGI bleed 2/2 to large duodenal ulcers. EGD also showed non-bleeding gastric ulcers. Biopsies >> chronic inactive gastritis . No H.pylori. Bleeding in setting of NSAIDS and plavix. Resolved.   -Complete the recommended 5-month course of twice daily pantoprazole.  The medication can be discontinued on 10/23/24.  We discussed that she would need to resume daily pantoprazole should she need NSAIDs in the future but advised to use Tylenol instead.  -Completed the prescribed 21 days of Carafate. Will remove med from home med list.  -Most recent hemoglobin on 09/09/2022 was 11.2 -Follow-up as needed   Pancreatic tail lesion -  CT AP with contrast shows hypodense. lesion measuring 1.4 cm in the tail of the pancreas ( see body of CT scan  report from 6/20)  Recent Pseudomonas aeruginosa UTI   CAD / history of stent placement. On Plavix   HPI   Brief GI history Cecelia was admitted to the hospital early June for an upper G bleed on plavix She had coffee-ground emesis , upper abdominal pain, and dark runny stool. She had been taking NSAIDS.  We saw her in consult on 07/23/2022. CTA was negative for bleeding but did show wall thickening and inflammation of stomach worrisome for gastritis or peptic ulcer disease, cholelithiasis without cholecystitis and sigmoid diverticulosis.   Hemoglobin declined from baseline of around 12 to 9.3 .  Inpatient EGD remarkable for large duodenal ulcers with pigmented material and  nonbleeding gastric ulcers. No H.pylori on biopsies.  Patient was readmitted to the hospital the end of June for abdominal pain.  CT scan showed gastric wall thickening with fat stranding and edema in the gastric antrum.  Lipase  was elevated to 265 but no findings of pancreatitis on CT scan.  LFTs were normal.  We saw her in consultation 08/10/2022.  We added Carafate to PPI.  We felt her abdominal pain was likely related to the known gastric  /  duodenal ulcers.     INTERVAL HISTORY    Chief complaint : Hospital follow-up  Cecelia has had no further nausea, vomiting or dark stools.  No further abdominal pain.  She completed 21 days of stool Carafate.  She is still taking twice daily pantoprazole.  She has not taken any NSAIDs since hospital discharge.   Previous GI Endoscopies / Labs / Imaging   **May not include all endoscopic evaluations   January 2018 screening colonoscopy -Excellent bowel prep - Decreased sphincter tone found on digital rectal exam. - Diverticulosis in the left colon. - The examination was otherwise normal on direct and retroflexion views. - No specimens collected.  EGD 07/24/22 - inpatient for upper GI bleed        A. STOMACH, BIOPSY:       Gastric antral / oxyntic mucosa with chronic inactive gastritis.       Immunohistochemical stain for H. pylori was negative       Latest Ref Rng & Units 09/09/2022   11:21 AM 08/11/2022   12:50 AM 08/10/2022    5:48 AM  CBC  WBC 4.0 - 10.5 K/uL 5.7  5.7  6.4   Hemoglobin 12.0 - 15.0 g/dL 47.4  9.0  8.4   Hematocrit 36.0 - 46.0 % 37.2  28.9  26.9  Platelets 150 - 400 K/uL 297  289  271      Past Medical History:  Diagnosis Date   Allergy    Anemia    Breast cancer (HCC)    Cataract    COPD (chronic obstructive pulmonary disease) (HCC)    Depression    Essential hypertension 11/29/2014   Glaucoma    Hemoptysis    Hypothyroidism    Intractable nausea and vomiting 07/23/2022   Left bundle branch block 03/16/2005   Oxygen deficiency    patient was using oxygen at home, her pulmonologist discontinued it and patient stopped using it on Monday Mar 20, 2015   Personal history of radiation therapy    Restless leg syndrome    Sinusitis     Vertigo     Past Surgical History:  Procedure Laterality Date   ABDOMINAL HYSTERECTOMY     APPENDECTOMY  2009   BIOPSY  07/24/2022   Procedure: BIOPSY;  Surgeon: Beverley Fiedler, MD;  Location: [facility];  Service: Gastroenterology;;   BREAST CYST ASPIRATION Right 06/12/2016   BREAST LUMPECTOMY Right 02/04/2020   BREAST LUMPECTOMY Left 02/04/2020   BREAST LUMPECTOMY WITH RADIOACTIVE SEED AND SENTINEL LYMPH NODE BIOPSY Bilateral 02/04/2020   Procedure: BILATERAL BREAST LUMPECTOMY WITH RADIOACTIVE SEED , RIGHT X 2, LEFT X 1 AND RIGHT SENTINEL LYMPH NODE MAPPING;  Surgeon: Harriette Bouillon, MD;  Location: [facility] OR;  Service: General;  Laterality: Bilateral;   CORONARY STENT INTERVENTION N/A 10/24/2021   Procedure: CORONARY STENT INTERVENTION;  Surgeon: Swaziland, Peter M, MD;  Location: [facility] INVASIVE CV LAB;  Service: Cardiovascular;  Laterality: N/A;   ESOPHAGOGASTRODUODENOSCOPY N/A 07/24/2022   Procedure: ESOPHAGOGASTRODUODENOSCOPY (EGD);  Surgeon: Beverley Fiedler, MD;  Location: [facility];  Service: Gastroenterology;  Laterality: N/A;   LEFT HEART CATH AND CORONARY ANGIOGRAPHY N/A 10/24/2021   Procedure: LEFT HEART CATH AND CORONARY ANGIOGRAPHY;  Surgeon: Swaziland, Peter M, MD;  Location: [facility] INVASIVE CV LAB;  Service: Cardiovascular;  Laterality: N/A;   RE-EXCISION OF BREAST LUMPECTOMY Bilateral 03/03/2020   Procedure: RE-EXCISION BILATERAL BREAST LUMPECTOMY;  Surgeon: Harriette Bouillon, MD;  Location: [facility];  Service: General;  Laterality: Bilateral;   VESICOVAGINAL FISTULA CLOSURE W/ TAH  1992    Family History  Problem Relation Age of Onset   Emphysema Mother        smoker   Heart disease Mother    COPD Mother    Colon polyps Mother    Emphysema Father        smoker   Heart disease Father    Breast cancer Sister    Colon cancer Neg Hx    Esophageal cancer Neg Hx    Stomach cancer Neg Hx    Rectal cancer Neg Hx     Current Medications, Allergies, Family History and  Social History were reviewed in [employer] record.     Current Outpatient Medications  Medication Sig Dispense Refill   albuterol (VENTOLIN HFA) 108 (90 Base) MCG/ACT inhaler Inhale 2 puffs into the lungs every 6 (six) hours as needed for wheezing or shortness of breath. 8 g 12   azelastine (ASTELIN) 0.1 % nasal spray 1-2 puffs each nostril twice daily if needed (Patient taking differently: Place 1-2 sprays into both nostrils daily as needed for rhinitis.) 30 mL 12   benzonatate (TESSALON) 200 MG capsule Take 1 capsule (200 mg total) by mouth 3 (three) times daily as needed for cough. 30 capsule 1   Cholecalciferol (VITAMIN D)  50 MCG (2000 UT) tablet Take 2,000 Units by mouth daily.     clopidogrel (PLAVIX) 75 MG tablet Take 1 tablet (75 mg total) by mouth daily with breakfast. 30 tablet 6   Coenzyme Q10 (COQ-10) 100 MG CAPS Take 100 mg by mouth daily.     dorzolamide (TRUSOPT) 2 % ophthalmic solution Place 1 drop into both eyes at bedtime.     EUTHYROX 75 MCG tablet Take 75 mcg by mouth every morning.     fluticasone (FLONASE) 50 MCG/ACT nasal spray Place 2 sprays into both nostrils daily. (Patient taking differently: Place 2 sprays into both nostrils daily as needed for allergies.) 15 g 2   Fluticasone-Umeclidin-Vilant (TRELEGY ELLIPTA) 100-62.5-25 MCG/ACT AEPB Inhale 1 puff into the lungs daily. (Patient taking differently: Inhale 1 puff into the lungs daily as needed (sob/wheezing).) 60 each 6   latanoprost (XALATAN) 0.005 % ophthalmic solution Place 1 drop into both eyes at bedtime. (Patient not taking: Reported on 08/09/2022)     metoprolol succinate (TOPROL-XL) 50 MG 24 hr tablet Take 1 tablet (50 mg total) by mouth daily. Take with or immediately following a meal. 90 tablet 0   Multiple Minerals (CALCIUM/MAGNESIUM/ZINC) TABS Take 3 tablets by mouth at bedtime.     Multiple Vitamin (MULTIVITAMIN) tablet Take 1 tablet by mouth daily.     pantoprazole (PROTONIX) 40 MG  tablet Take 1 tablet (40 mg total) by mouth 2 (two) times daily before a meal. (Patient taking differently: Take 40 mg by mouth daily as needed (heartburn).) 60 tablet 2   pyridOXINE (VITAMIN B6) 100 MG tablet Take 100 mg by mouth daily.     sucralfate (CARAFATE) 1 GM/10ML suspension Take 10 mLs (1 g total) by mouth 4 (four) times daily -  with meals and at bedtime for 21 days. 840 mL 0   venlafaxine XR (EFFEXOR-XR) 37.5 MG 24 hr capsule Take 37.5 mg by mouth daily.     vitamin C (ASCORBIC ACID) 500 MG tablet Take 500 mg by mouth daily.     vitamin E 400 UNIT capsule Take 400 Units by mouth daily.     No current facility-administered medications for this visit.    Review of Systems: No chest pain. No shortness of breath. No urinary complaints.    Physical Exam  Wt Readings from Last 3 Encounters:  09/09/22 130 lb (59 kg)  08/09/22 125 lb (56.7 kg)  07/31/22 127 lb 9.6 oz (57.9 kg)    BP 122/62   Pulse 82   Ht 5\' 7"  (1.702 m)   Wt 122 lb (55.3 kg)   BMI 19.11 kg/m  Constitutional:  Pleasant, generally well appearing female in no acute distress. Psychiatric: Normal mood and affect. Behavior is normal. EENT: Pupils normal.  Conjunctivae are normal. No scleral icterus. Neck supple.  Cardiovascular: Normal rate, regular rhythm.  Pulmonary/chest: Effort normal and breath sounds normal. No wheezing, rales or rhonchi. Abdominal: Soft, nondistended, nontender. Bowel sounds active throughout. There are no masses palpable. No hepatomegaly. Neurological: Alert and oriented to person place and time.    Willette Cluster, NP  10/09/2022, 8:30 AM

## 2022-10-09 NOTE — Patient Instructions (Signed)
_______________________________________________________  If your blood pressure at your visit was 140/90 or greater, please contact your primary care physician to follow up on this. _______________________________________________________  If you are age 83 or older, your body mass index should be between 23-30. Your Body mass index is 19.11 kg/m. If this is out of the aforementioned range listed, please consider follow up with your Primary Care Provider. _______________________________________________________  The [facility] providers would like to encourage you to use [facility] to communicate with providers for non-urgent requests or questions.  Due to long hold times on the telephone, sending your provider a message by [facility] may be a faster and more efficient way to get a response.  Please allow 48 business hours for a response.  Please remember that this is for non-urgent requests.  _______________________________________________________  CONTINUE: pantoprazole 40mg  one tablet two times daily until October 24, 2022.  Please call our office to follow up as needed.   Thank you for entrusting me with your care and choosing [facility].  Willette Cluster, NP

## 2022-10-13 NOTE — Progress Notes (Signed)
____________________________________________________________  Attending physician addendum:  Thank you for sending this case to me. I have reviewed the entire note and agree with the plan.   Henry Danis, MD  ____________________________________________________________  

## 2022-11-01 ENCOUNTER — Other Ambulatory Visit: Payer: Self-pay

## 2022-11-01 ENCOUNTER — Emergency Department (HOSPITAL_COMMUNITY): Payer: Medicare HMO

## 2022-11-01 ENCOUNTER — Emergency Department (HOSPITAL_COMMUNITY)
Admission: EM | Admit: 2022-11-01 | Discharge: 2022-11-02 | Disposition: A | Discharge status: Home / Self Care | Payer: Medicare HMO | Attending: Emergency Medicine | Admitting: Emergency Medicine

## 2022-11-01 DIAGNOSIS — R1013 Epigastric pain: Secondary | ICD-10-CM | POA: Diagnosis not present

## 2022-11-01 DIAGNOSIS — R059 Cough, unspecified: Secondary | ICD-10-CM | POA: Insufficient documentation

## 2022-11-01 DIAGNOSIS — J984 Other disorders of lung: Secondary | ICD-10-CM | POA: Diagnosis not present

## 2022-11-01 DIAGNOSIS — R053 Chronic cough: Secondary | ICD-10-CM | POA: Diagnosis not present

## 2022-11-01 DIAGNOSIS — Z7902 Long term (current) use of antithrombotics/antiplatelets: Secondary | ICD-10-CM | POA: Diagnosis not present

## 2022-11-01 DIAGNOSIS — J479 Bronchiectasis, uncomplicated: Secondary | ICD-10-CM | POA: Diagnosis not present

## 2022-11-01 LAB — COMPREHENSIVE METABOLIC PANEL
ALT: 14 U/L (ref 0–44)
AST: 23 U/L (ref 15–41)
Albumin: 3.4 g/dL — ABNORMAL LOW (ref 3.5–5.0)
Alkaline Phosphatase: 95 U/L (ref 38–126)
Anion gap: 17 — ABNORMAL HIGH (ref 5–15)
BUN: 7 mg/dL — ABNORMAL LOW (ref 8–23)
CO2: 27 mmol/L (ref 22–32)
Calcium: 9.6 mg/dL (ref 8.9–10.3)
Chloride: 97 mmol/L — ABNORMAL LOW (ref 98–111)
Creatinine, Ser: 0.76 mg/dL (ref 0.44–1.00)
GFR, Estimated: >60 mL/min (ref >60)
Glucose, Bld: 131 mg/dL — ABNORMAL HIGH (ref 70–99)
Potassium: 3.2 mmol/L — ABNORMAL LOW (ref 3.5–5.1)
Sodium: 141 mmol/L (ref 135–145)
Total Bilirubin: 0.3 mg/dL (ref 0.3–1.2)
Total Protein: 7.4 g/dL (ref 6.5–8.1)

## 2022-11-01 LAB — CBC
HCT: 39.1 % (ref 36.0–46.0)
Hemoglobin: 12 g/dL (ref 12.0–15.0)
MCH: 28.4 pg (ref 26.0–34.0)
MCHC: 30.7 g/dL (ref 30.0–36.0)
MCV: 92.4 fL (ref 80.0–100.0)
Platelets: 298 10*3/uL (ref 150–400)
RBC: 4.23 MIL/uL (ref 3.87–5.11)
RDW: 14.3 % (ref 11.5–15.5)
WBC: 8.1 10*3/uL (ref 4.0–10.5)
nRBC: 0 % (ref 0.0–0.2)

## 2022-11-01 LAB — LIPASE, BLOOD: Lipase: 89 U/L — ABNORMAL HIGH (ref 11–51)

## 2022-11-01 NOTE — ED Triage Notes (Signed)
Patient reports pain across abdomen this evening with persistent dry cough , denies emesis or diarrhea.

## 2022-11-02 LAB — URINALYSIS, ROUTINE W REFLEX MICROSCOPIC
Bilirubin Urine: NEGATIVE
Glucose, UA: NEGATIVE mg/dL
Hgb urine dipstick: NEGATIVE
Ketones, ur: NEGATIVE mg/dL
Nitrite: NEGATIVE
Protein, ur: NEGATIVE mg/dL
Specific Gravity, Urine: 1.005 (ref 1.005–1.030)
WBC, UA: >50 WBC/hpf (ref 0–5)
pH: 6 (ref 5.0–8.0)

## 2022-11-02 MED ORDER — PANTOPRAZOLE SODIUM 40 MG PO TBEC
40.0000 mg | DELAYED_RELEASE_TABLET | Freq: Every day | ORAL | Status: DC
  Administered 2022-11-02: 40 mg via ORAL
  Filled 2022-11-02: qty 1

## 2022-11-02 MED ORDER — PANTOPRAZOLE SODIUM 40 MG PO TBEC: 40.0000 mg | DELAYED_RELEASE_TABLET | Freq: Two times a day (BID) | ORAL | 2 refills | Status: DC

## 2022-11-02 MED ORDER — SUCRALFATE 1 GM/10ML PO SUSP
1.0000 g | Freq: Three times a day (TID) | ORAL | Status: DC
  Administered 2022-11-02: 1 g via ORAL
  Filled 2022-11-02: qty 10

## 2022-11-02 MED ORDER — FAMOTIDINE 20 MG PO TABS
20.0000 mg | ORAL_TABLET | Freq: Once | ORAL | Status: AC
  Administered 2022-11-02: 20 mg via ORAL
  Filled 2022-11-02: qty 1

## 2022-11-02 MED ORDER — SUCRALFATE 1 GM/10ML PO SUSP: 1.0000 g | Freq: Three times a day (TID) | ORAL | 2 refills | Status: AC

## 2022-11-02 MED ORDER — FAMOTIDINE 20 MG PO TABS: 20.0000 mg | ORAL_TABLET | Freq: Two times a day (BID) | ORAL | 0 refills | Status: DC

## 2022-11-02 NOTE — ED Provider Notes (Signed)
[place] [facility]GENCY DEPARTMENT AT [facility] Provider Note   CSN: [id number] Arrival date & time: 11/01/22  2007     History  Chief Complaint  Patient presents with   Abdominal Pain    Cough    [name] is a 82 y.o. female.  82 year old with history of peptic ulcer disease, gastritis no longer taking her medications who presents the ER today with epigastric pain.  Patient has not eaten all day and this evening she started having some upper abdominal pain.  She thought it was too painful to eat so she came here for further evaluation.  No fevers or vomiting.  No diarrhea or constipation.  She was admitted back in June for GI bleed and found to have 2 gastric ulcers and 2 larger duodenal ulcers.  She is then back a couple times since then and seen GI as well.  She states she no longer takes Carafate or Protonix.  No other antiacids.  She has been in the apartment for at least 5 hours prior to my evaluation and at this time she is pain-free.   Abdominal Pain      Home Medications Prior to Admission medications   Medication Sig Start Date End Date Taking? Authorizing Provider  famotidine (PEPCID) 20 MG tablet Take 1 tablet (20 mg total) by mouth 2 (two) times daily for 7 days. 11/02/22 11/09/22 Yes Marquisa Salih, Barbara Cower, MD  sucralfate (CARAFATE) 1 GM/10ML suspension Take 10 mLs (1 g total) by mouth 4 (four) times daily -  with meals and at bedtime. For a week then as needed. 11/02/22  Yes Shanena Pellegrino, Barbara Cower, MD  albuterol (VENTOLIN HFA) 108 (90 Base) MCG/ACT inhaler Inhale 2 puffs into the lungs every 6 (six) hours as needed for wheezing or shortness of breath. 11/27/18   Jetty Duhamel D, MD  azelastine (ASTELIN) 0.1 % nasal spray 1-2 puffs each nostril twice daily if needed Patient taking differently: Place 1-2 sprays into both nostrils daily as needed for rhinitis. 11/26/17   Waymon Budge, MD  benzonatate (TESSALON) 200 MG capsule Take 1 capsule (200 mg total) by mouth 3  (three) times daily as needed for cough. 05/27/20   Jetty Duhamel D, MD  Cholecalciferol (VITAMIN D) 50 MCG (2000 UT) tablet Take 2,000 Units by mouth daily.    [provider]  clopidogrel (PLAVIX) 75 MG tablet Take 1 tablet (75 mg total) by mouth daily with breakfast. 05/11/22   Parke Poisson, MD  EUTHYROX 75 MCG tablet Take 75 mcg by mouth every morning. 10/11/21   [provider]  latanoprost (XALATAN) 0.005 % ophthalmic solution Place 1 drop into both eyes at bedtime.    [provider]  Multiple Minerals (CALCIUM/MAGNESIUM/ZINC) TABS Take 3 tablets by mouth at bedtime.    [provider]  pantoprazole (PROTONIX) 40 MG tablet Take 1 tablet (40 mg total) by mouth 2 (two) times daily before a meal. For a week then take daily 11/02/22   Holten Spano, Barbara Cower, MD  vitamin C (ASCORBIC ACID) 500 MG tablet Take 500 mg by mouth daily.    [provider]  vitamin E 400 UNIT capsule Take 400 Units by mouth daily.    [provider]      Allergies    Augmentin [amoxicillin-pot clavulanate], Black cohosh, Codeine, Hyoscyamine, Oxybutynin chloride, and Minocycline    Review of Systems   Review of Systems  Gastrointestinal:  Positive for abdominal pain.    Physical Exam Updated Vital Signs  BP (!) 141/73 (BP Location: Right Arm)   Pulse 96   Temp 98.6 F (37 C) (Oral)   Resp 18   SpO2 96%  Physical Exam Vitals and nursing note reviewed.  Constitutional:      Appearance: She is well-developed.  HENT:     Head: Normocephalic and atraumatic.  Cardiovascular:     Rate and Rhythm: Normal rate and regular rhythm.  Pulmonary:     Effort: No respiratory distress.     Breath sounds: No stridor.  Abdominal:     General: There is no distension.     Tenderness: There is no abdominal tenderness.  Musculoskeletal:     Cervical back: Normal range of motion.  Neurological:     Mental Status: She is alert.     ED Results / Procedures / Treatments    Labs (all labs ordered are listed, but only abnormal results are displayed) Labs Reviewed  LIPASE, BLOOD - Abnormal; Notable for the following components:      Result Value   Lipase 89 (*)    All other components within normal limits  COMPREHENSIVE METABOLIC PANEL - Abnormal; Notable for the following components:   Potassium 3.2 (*)    Chloride 97 (*)    Glucose, Bld 131 (*)    BUN 7 (*)    Albumin 3.4 (*)    Anion gap 17 (*)    All other components within normal limits  URINALYSIS, ROUTINE W REFLEX MICROSCOPIC - Abnormal; Notable for the following components:   APPearance HAZY (*)    Leukocytes,Ua LARGE (*)    Bacteria, UA RARE (*)    All other components within normal limits  URINE CULTURE  CBC    EKG EKG Interpretation Date/Time:  Thursday November 01 2022 20:37:47 EDT Ventricular Rate:  109 PR Interval:    QRS Duration:  110 QT Interval:  376 QTC Calculation: 506 R Axis:   54  Text Interpretation: Accelerated Junctional rhythm with occasional Premature ventricular complexes Septal infarct , age undetermined Abnormal ECG When compared with ECG of 09-Sep-2022 11:13, PREVIOUS ECG IS PRESENT Confirmed by Marily Memos [phone]) on 11/02/2022 1:06:43 AM  Radiology DG Chest 2 View  Result Date: 11/01/2022 CLINICAL DATA:  Persistent cough EXAM: CHEST - 2 VIEW COMPARISON:  09/09/2022, 10/21/2021, 05/30/2021, chest CT 05/11/2018 FINDINGS: Chronic lung disease with pleuroparenchymal scarring at the apices and areas of bronchiectasis. Stable right hilar distortion and scarring. No acute airspace disease. Normal cardiac size. No pneumothorax. IMPRESSION: Chronic lung disease with bronchiectasis and scarring. No acute airspace disease. Electronically Signed   By: Jasmine Pang M.D.   On: 11/01/2022 21:27    Procedures Procedures    Medications Ordered in ED Medications  pantoprazole (PROTONIX) EC tablet 40 mg (has no administration in time range)  famotidine (PEPCID) tablet 20  mg (has no administration in time range)  sucralfate (CARAFATE) 1 GM/10ML suspension 1 g (has no administration in time range)    ED Course/ Medical Decision Making/ A&P Clinical Course as of 11/02/22 0201  Fri Nov 02, 2022  0134 Leukocytes,Ua(!): LARGE Will culture, no s/s of uti  [JM]  0135 Anion gap(!): 17 Likely related to not eating  [JM]  0135 Potassium(!): 3.2 Likely related to meds/PUD [JM]  0135 Lipase(!): 89 Likely related to not eating [JM]    Clinical Course User Index [JM] Andrina Locken, Barbara Cower, MD  Medical Decision Making Amount and/or Complexity of Data Reviewed Labs: ordered. Decision-making details documented in ED Course. Radiology: ordered.  Risk Prescription drug management.   Benign abdomen and reassuring labs.  Suspect this is just a recurrence of her gastritis/PUD.  Will reinitiate medications for same.  No indication for imaging with normal vital signs and reassuring evaluation.  Tolerating PO. Will follow-up with GI.  Will return here for any new or worsening symptoms.  Final Clinical Impression(s) / ED Diagnoses Final diagnoses:  Epigastric pain    Rx / DC Orders ED Discharge Orders          Ordered    pantoprazole (PROTONIX) 40 MG tablet  2 times daily before meals        11/02/22 0158    famotidine (PEPCID) 20 MG tablet  2 times daily        11/02/22 0158    sucralfate (CARAFATE) 1 GM/10ML suspension  3 times daily with meals & bedtime        11/02/22 0158              Kashus Karlen, Barbara Cower, MD 11/02/22 1610

## 2022-11-03 LAB — URINE CULTURE: Culture: <10000 — AB

## 2022-11-09 DIAGNOSIS — E039 Hypothyroidism, unspecified: Secondary | ICD-10-CM | POA: Diagnosis not present

## 2022-11-09 DIAGNOSIS — K869 Disease of pancreas, unspecified: Secondary | ICD-10-CM | POA: Diagnosis not present

## 2022-11-09 DIAGNOSIS — E440 Moderate protein-calorie malnutrition: Secondary | ICD-10-CM | POA: Diagnosis not present

## 2022-11-09 DIAGNOSIS — D649 Anemia, unspecified: Secondary | ICD-10-CM | POA: Diagnosis not present

## 2022-11-28 ENCOUNTER — Ambulatory Visit: Payer: Medicare HMO | Attending: Internal Medicine | Admitting: Internal Medicine

## 2023-02-06 DIAGNOSIS — J209 Acute bronchitis, unspecified: Secondary | ICD-10-CM | POA: Diagnosis not present

## 2023-03-25 ENCOUNTER — Emergency Department (HOSPITAL_COMMUNITY): Payer: Medicare PPO

## 2023-03-25 ENCOUNTER — Emergency Department (HOSPITAL_COMMUNITY)
Admission: EM | Admit: 2023-03-25 | Discharge: 2023-03-26 | Disposition: A | Discharge status: Home / Self Care | Payer: Medicare PPO | Attending: Emergency Medicine | Admitting: Emergency Medicine

## 2023-03-25 ENCOUNTER — Other Ambulatory Visit: Payer: Self-pay

## 2023-03-25 DIAGNOSIS — I700 Atherosclerosis of aorta: Secondary | ICD-10-CM | POA: Diagnosis not present

## 2023-03-25 DIAGNOSIS — G319 Degenerative disease of nervous system, unspecified: Secondary | ICD-10-CM | POA: Diagnosis not present

## 2023-03-25 DIAGNOSIS — I10 Essential (primary) hypertension: Secondary | ICD-10-CM | POA: Diagnosis not present

## 2023-03-25 DIAGNOSIS — J449 Chronic obstructive pulmonary disease, unspecified: Secondary | ICD-10-CM | POA: Insufficient documentation

## 2023-03-25 DIAGNOSIS — R262 Difficulty in walking, not elsewhere classified: Secondary | ICD-10-CM | POA: Diagnosis not present

## 2023-03-25 DIAGNOSIS — R42 Dizziness and giddiness: Secondary | ICD-10-CM | POA: Insufficient documentation

## 2023-03-25 DIAGNOSIS — E869 Volume depletion, unspecified: Secondary | ICD-10-CM | POA: Diagnosis not present

## 2023-03-25 DIAGNOSIS — J929 Pleural plaque without asbestos: Secondary | ICD-10-CM | POA: Diagnosis not present

## 2023-03-25 DIAGNOSIS — R0902 Hypoxemia: Secondary | ICD-10-CM | POA: Insufficient documentation

## 2023-03-25 DIAGNOSIS — R519 Headache, unspecified: Secondary | ICD-10-CM | POA: Diagnosis not present

## 2023-03-25 DIAGNOSIS — Z471 Aftercare following joint replacement surgery: Secondary | ICD-10-CM | POA: Diagnosis not present

## 2023-03-25 DIAGNOSIS — K8020 Calculus of gallbladder without cholecystitis without obstruction: Secondary | ICD-10-CM | POA: Insufficient documentation

## 2023-03-25 DIAGNOSIS — R748 Abnormal levels of other serum enzymes: Secondary | ICD-10-CM | POA: Diagnosis not present

## 2023-03-25 DIAGNOSIS — Z955 Presence of coronary angioplasty implant and graft: Secondary | ICD-10-CM | POA: Diagnosis not present

## 2023-03-25 DIAGNOSIS — A319 Mycobacterial infection, unspecified: Secondary | ICD-10-CM | POA: Insufficient documentation

## 2023-03-25 DIAGNOSIS — J479 Bronchiectasis, uncomplicated: Secondary | ICD-10-CM | POA: Diagnosis not present

## 2023-03-25 DIAGNOSIS — Z1152 Encounter for screening for COVID-19: Secondary | ICD-10-CM | POA: Insufficient documentation

## 2023-03-25 DIAGNOSIS — I447 Left bundle-branch block, unspecified: Secondary | ICD-10-CM | POA: Diagnosis not present

## 2023-03-25 DIAGNOSIS — R918 Other nonspecific abnormal finding of lung field: Secondary | ICD-10-CM | POA: Diagnosis not present

## 2023-03-25 DIAGNOSIS — R110 Nausea: Secondary | ICD-10-CM | POA: Diagnosis not present

## 2023-03-25 DIAGNOSIS — I2510 Atherosclerotic heart disease of native coronary artery without angina pectoris: Secondary | ICD-10-CM | POA: Diagnosis not present

## 2023-03-25 DIAGNOSIS — I6782 Cerebral ischemia: Secondary | ICD-10-CM | POA: Diagnosis not present

## 2023-03-25 LAB — CBC WITH DIFFERENTIAL/PLATELET
Abs Immature Granulocytes: 0.02 10*3/uL (ref 0.00–0.07)
Basophils Absolute: 0 10*3/uL (ref 0.0–0.1)
Basophils Relative: 1 %
Eosinophils Absolute: 0.1 10*3/uL (ref 0.0–0.5)
Eosinophils Relative: 1 %
HCT: 46 % (ref 36.0–46.0)
Hemoglobin: 14.4 g/dL (ref 12.0–15.0)
Immature Granulocytes: 0 %
Lymphocytes Relative: 10 %
Lymphs Abs: 0.9 10*3/uL (ref 0.7–4.0)
MCH: 30.5 pg (ref 26.0–34.0)
MCHC: 31.3 g/dL (ref 30.0–36.0)
MCV: 97.5 fL (ref 80.0–100.0)
Monocytes Absolute: 0.5 10*3/uL (ref 0.1–1.0)
Monocytes Relative: 6 %
Neutro Abs: 7.4 10*3/uL (ref 1.7–7.7)
Neutrophils Relative %: 82 %
Platelets: 213 10*3/uL (ref 150–400)
RBC: 4.72 MIL/uL (ref 3.87–5.11)
RDW: 13.6 % (ref 11.5–15.5)
WBC: 8.9 10*3/uL (ref 4.0–10.5)
nRBC: 0 % (ref 0.0–0.2)

## 2023-03-25 LAB — COMPREHENSIVE METABOLIC PANEL
ALT: 12 U/L (ref 0–44)
AST: 26 U/L (ref 15–41)
Albumin: 3.4 g/dL — ABNORMAL LOW (ref 3.5–5.0)
Alkaline Phosphatase: 91 U/L (ref 38–126)
Anion gap: 10 (ref 5–15)
BUN: 14 mg/dL (ref 8–23)
CO2: 26 mmol/L (ref 22–32)
Calcium: 9.4 mg/dL (ref 8.9–10.3)
Chloride: 102 mmol/L (ref 98–111)
Creatinine, Ser: 0.74 mg/dL (ref 0.44–1.00)
GFR, Estimated: >60 mL/min (ref >60)
Glucose, Bld: 105 mg/dL — ABNORMAL HIGH (ref 70–99)
Potassium: 4 mmol/L (ref 3.5–5.1)
Sodium: 138 mmol/L (ref 135–145)
Total Bilirubin: 0.6 mg/dL (ref 0.0–1.2)
Total Protein: 7.1 g/dL (ref 6.5–8.1)

## 2023-03-25 LAB — RESP PANEL BY RT-PCR (RSV, FLU A&B, COVID)  RVPGX2
Influenza A by PCR: NEGATIVE
Influenza B by PCR: NEGATIVE
Resp Syncytial Virus by PCR: NEGATIVE
SARS Coronavirus 2 by RT PCR: NEGATIVE

## 2023-03-25 LAB — MAGNESIUM: Magnesium: 2.1 mg/dL (ref 1.7–2.4)

## 2023-03-25 LAB — LIPASE, BLOOD: Lipase: 86 U/L — ABNORMAL HIGH (ref 11–51)

## 2023-03-25 LAB — TROPONIN I (HIGH SENSITIVITY): Troponin I (High Sensitivity): 6 ng/L (ref <18)

## 2023-03-25 MED ORDER — SODIUM CHLORIDE 0.9 % IV BOLUS
500.0000 mL | Freq: Once | INTRAVENOUS | Status: AC
  Administered 2023-03-25: 500 mL via INTRAVENOUS

## 2023-03-25 MED ORDER — MECLIZINE HCL 25 MG PO TABS
12.5000 mg | ORAL_TABLET | Freq: Once | ORAL | Status: AC
  Administered 2023-03-25: 12.5 mg via ORAL
  Filled 2023-03-25: qty 1

## 2023-03-25 MED ORDER — ONDANSETRON HCL 4 MG/2ML IJ SOLN
4.0000 mg | Freq: Once | INTRAMUSCULAR | Status: AC
  Administered 2023-03-25: 4 mg via INTRAVENOUS
  Filled 2023-03-25: qty 2

## 2023-03-25 MED ORDER — SODIUM CHLORIDE 0.9 % IV BOLUS: 1000.0000 mL | Freq: Once | INTRAVENOUS | Status: DC

## 2023-03-25 NOTE — ED Provider Notes (Signed)
[place] EMERGENCY DEPARTMENT AT [facility] Provider Note   CSN: [id number] Arrival date & time: 03/25/23  1153     History {Add pertinent medical, surgical, social history, OB history to HPI:1} Chief Complaint  Patient presents with   Dizziness   Nausea    [name] is a 83 y.o. female.  83 year old female history of hypertension, COPD, and CAD status post PCI who presents emergency department with dizziness.  History obtained per the patient and her son.  Reports that when she woke up this morning at 8:30 am she started feeling very dizzy.  Went to bed at approx 10 pm.  The patient describes it to me as a lightheaded sensation that was worse with standing.  However, her son reports that she was complaining of a room spinning sensation whenever he would try to set her up or get her up to stand.  No recent illnesses.  Also was complaining of a headache at that time.  Was having some nausea as well.  Reports that her nausea and dizziness have improved at this time.  Does have a remote history of vertigo.  Son reports that she has been in her usual state of health recently and saw her last night and had no complaints.       Home Medications Prior to Admission medications   Medication Sig Start Date End Date Taking? Authorizing Provider  albuterol (VENTOLIN HFA) 108 (90 Base) MCG/ACT inhaler Inhale 2 puffs into the lungs every 6 (six) hours as needed for wheezing or shortness of breath. 11/27/18   Jetty Duhamel D, MD  azelastine (ASTELIN) 0.1 % nasal spray 1-2 puffs each nostril twice daily if needed Patient taking differently: Place 1-2 sprays into both nostrils daily as needed for rhinitis. 11/26/17   Waymon Budge, MD  benzonatate (TESSALON) 200 MG capsule Take 1 capsule (200 mg total) by mouth 3 (three) times daily as needed for cough. 05/27/20   Jetty Duhamel D, MD  Cholecalciferol (VITAMIN D) 50 MCG (2000 UT) tablet Take 2,000 Units by mouth daily.     [provider]  clopidogrel (PLAVIX) 75 MG tablet Take 1 tablet (75 mg total) by mouth daily with breakfast. 05/11/22   Parke Poisson, MD  EUTHYROX 75 MCG tablet Take 75 mcg by mouth every morning. 10/11/21   [provider]  famotidine (PEPCID) 20 MG tablet Take 1 tablet (20 mg total) by mouth 2 (two) times daily for 7 days. 11/02/22 11/09/22  Mesner, Barbara Cower, MD  latanoprost (XALATAN) 0.005 % ophthalmic solution Place 1 drop into both eyes at bedtime.    [provider]  Multiple Minerals (CALCIUM/MAGNESIUM/ZINC) TABS Take 3 tablets by mouth at bedtime.    [provider]  pantoprazole (PROTONIX) 40 MG tablet Take 1 tablet (40 mg total) by mouth 2 (two) times daily before a meal. For a week then take daily 11/02/22   Mesner, Barbara Cower, MD  sucralfate (CARAFATE) 1 GM/10ML suspension Take 10 mLs (1 g total) by mouth 4 (four) times daily -  with meals and at bedtime. For a week then as needed. 11/02/22   Mesner, Barbara Cower, MD  vitamin C (ASCORBIC ACID) 500 MG tablet Take 500 mg by mouth daily.    [provider]  vitamin E 400 UNIT capsule Take 400 Units by mouth daily.    [provider]      Allergies    Augmentin [amoxicillin-pot clavulanate], Black cohosh, Codeine, Hyoscyamine, Oxybutynin chloride, and Minocycline  Review of Systems   Review of Systems  Physical Exam Updated Vital Signs BP (!) 140/76 (BP Location: Right Arm)   Pulse 81   Temp 97.8 F (36.6 C) (Oral)   Resp 18   Ht 5\' 7"  (1.702 m)   Wt 57.6 kg   SpO2 94%   BMI 19.89 kg/m  Physical Exam Vitals and nursing note reviewed.  Constitutional:      General: She is not in acute distress.    Appearance: She is well-developed.  HENT:     Head: Normocephalic and atraumatic.     Right Ear: External ear normal.     Left Ear: External ear normal.     Nose: Nose normal.  Eyes:     Extraocular Movements: Extraocular movements intact.     Conjunctiva/sclera: Conjunctivae normal.      Pupils: Pupils are equal, round, and reactive to light.  Cardiovascular:     Rate and Rhythm: Normal rate and regular rhythm.     Heart sounds: No murmur heard. Pulmonary:     Effort: Pulmonary effort is normal. No respiratory distress.     Breath sounds: Normal breath sounds.  Abdominal:     General: Abdomen is flat. There is no distension.     Palpations: Abdomen is soft. There is no mass.     Tenderness: There is no abdominal tenderness. There is no guarding.  Musculoskeletal:     Cervical back: Normal range of motion and neck supple.     Right lower leg: No edema.     Left lower leg: No edema.  Skin:    General: Skin is warm and dry.  Neurological:     Mental Status: She is alert.     Comments: MENTAL STATUS: AAOx3 CRANIAL NERVES: II: Pupils equal and reactive 4 mm BL, no RAPD, no VF deficits III, IV, VI: EOM intact, no gaze preference or deviation, no nystagmus. V: normal sensation to light touch in V1, V2, and V3 segments bilaterally VII: no facial weakness or asymmetry, no nasolabial fold flattening VIII: normal hearing to speech and finger friction IX, X: normal palatal elevation, no uvular deviation XI: 5/5 head turn and 5/5 shoulder shrug bilaterally XII: midline tongue protrusion MOTOR: 5/5 strength in R shoulder flexion, elbow flexion and extension, and grip strength. 5/5 strength in L shoulder flexion, elbow flexion and extension, and grip strength.  5/5 strength in R hip and knee flexion, knee extension, ankle plantar and dorsiflexion. 5/5 strength in L hip and knee flexion, knee extension, ankle plantar and dorsiflexion. SENSORY: Normal sensation to light touch in all extremities COORD: Normal finger to nose and heel to shin, no tremor, no dysmetria STATION: no truncal ataxia GAIT: Required some assistance with walking due to unsteady gait No aphasia.  No visual or sensory neglect.  Psychiatric:        Mood and Affect: Mood normal.     ED Results /  Procedures / Treatments   Labs (all labs ordered are listed, but only abnormal results are displayed) Labs Reviewed  RESP PANEL BY RT-PCR (RSV, FLU A&B, COVID)  [id number]  CBC WITH DIFFERENTIAL/PLATELET  COMPREHENSIVE METABOLIC PANEL  LIPASE, BLOOD  MAGNESIUM  TROPONIN I (HIGH SENSITIVITY)    EKG None  Radiology No results found.  Procedures Procedures  {Document cardiac monitor, telemetry assessment procedure when appropriate:1}  Medications Ordered in ED Medications  sodium chloride 0.9 % bolus 500 mL (has no administration in time range)  ondansetron (ZOFRAN) injection 4 mg (4  mg Intravenous Given 03/25/23 1246)    ED Course/ Medical Decision Making/ A&P   {   Click here for ABCD2, HEART and other calculatorsREFRESH Note before signing :1}                              Medical Decision Making Amount and/or Complexity of Data Reviewed Labs: ordered. Radiology: ordered.  Risk Prescription drug management.   ***  {Document critical care time when appropriate:1} {Document review of labs and clinical decision tools ie heart score, Chads2Vasc2 etc:1}  {Document your independent review of radiology images, and any outside records:1} {Document your discussion with family members, caretakers, and with consultants:1} {Document social determinants of health affecting pt's care:1} {Document your decision making why or why not admission, treatments were needed:1} Final Clinical Impression(s) / ED Diagnoses Final diagnoses:  None    Rx / DC Orders ED Discharge Orders     None

## 2023-03-25 NOTE — ED Triage Notes (Signed)
Pt presents via EMS from home for dizziness and nausea that started this morning upon waking up. Per EMS, patient had an episode of incontinence. Per patient, she usually knows when she needs to use the bathroom but she couldn't get to the bathroom. Patient is AAOx4; speaking in clear, full sentences. Patient received 4mg  IV zofran en route from EMS.

## 2023-03-25 NOTE — Discharge Instructions (Signed)
You were seen for your dizziness in the emergency department.   At home, please stay well hydrated.   Check your MyChart online for the results of any tests that had not resulted by the time you left the emergency department.   Follow-up with your primary doctor in 2-3 days regarding your visit.    Return immediately to the emergency department if you experience any of the following: fainting, falls, chest pain, or any other concerning symptoms.    Thank you for visiting our Emergency Department. It was a pleasure taking care of you today.

## 2023-03-25 NOTE — ED Notes (Signed)
 Patient transported to CT

## 2023-03-25 NOTE — ED Notes (Signed)
 Patient transported to MRI

## 2023-03-25 NOTE — ED Provider Notes (Signed)
  Physical Exam  BP (!) 117/57   Pulse 83   Temp 97.8 F (36.6 C) (Oral)   Resp 19   Ht 5\' 7"  (1.702 m)   Wt 57.6 kg   SpO2 93%   BMI 19.89 kg/m   Physical Exam  Procedures  Procedures  ED Course / MDM   Clinical Course as of 03/25/23 1617  Mon Mar 25, 2023  1418 Lipase(!): 86 At baseline [RP]  1616 Signed out to Dr Karene Fry [RP]    Clinical Course User Index [RP] Rondel Baton, MD   Medical Decision Making Amount and/or Complexity of Data Reviewed Labs: ordered. Decision-making details documented in ED Course. Radiology: ordered.  Risk Prescription drug management.   58F, presenting with dizziness, ?lightheadedness vs room spinning. Positional and worse with attempts at standing. Not worse with head movement. Neuro exam normal, except couldn't stand without assistance, which is new. MRI brain pending.    MRI Brain:  IMPRESSION:  No acute intracranial process. No evidence of acute or subacute  infarct.   CT Head: IMPRESSION:  No acute intracranial abnormality.    Stable cerebral atrophy and chronic small vessel disease.   CXR: IMPRESSION:  1. No active cardiopulmonary disease.    On repeat assessment, the patient was feeling symptomatically improved however her oxygen saturations were around 89% on room air.  She has a history of bronchiectasis, rhonchi noted on exam.  A chest x-ray revealed clear lungs.  Patient with lightheadedness, hypoxia, ordered D-dimer and BNP.  Discussed the care of the patient in signout to Dr. Durwin Nora pending results of imaging and pulse oximetry via ambulation.  Will plan to obtain CTA imaging to further evaluate.  Follow-up results of CTA imaging due to low O2 sats in the setting of lightheadedness. Signout given to Dr. Durwin Nora at 0100.    Ernie Avena, MD 03/26/23 [phone]

## 2023-03-26 ENCOUNTER — Emergency Department (HOSPITAL_COMMUNITY): Payer: Medicare PPO

## 2023-03-26 DIAGNOSIS — R918 Other nonspecific abnormal finding of lung field: Secondary | ICD-10-CM | POA: Diagnosis not present

## 2023-03-26 DIAGNOSIS — K8020 Calculus of gallbladder without cholecystitis without obstruction: Secondary | ICD-10-CM | POA: Diagnosis not present

## 2023-03-26 DIAGNOSIS — E869 Volume depletion, unspecified: Secondary | ICD-10-CM | POA: Diagnosis not present

## 2023-03-26 DIAGNOSIS — J479 Bronchiectasis, uncomplicated: Secondary | ICD-10-CM | POA: Diagnosis not present

## 2023-03-26 LAB — D-DIMER, QUANTITATIVE: D-Dimer, Quant: 0.33 ug{FEU}/mL (ref 0.00–0.50)

## 2023-03-26 LAB — BRAIN NATRIURETIC PEPTIDE: B Natriuretic Peptide: 91.1 pg/mL (ref 0.0–100.0)

## 2023-03-26 MED ORDER — IOHEXOL 350 MG/ML SOLN
75.0000 mL | Freq: Once | INTRAVENOUS | Status: AC | PRN
  Administered 2023-03-26: 75 mL via INTRAVENOUS

## 2023-03-26 NOTE — ED Notes (Signed)
Pt ambulates independently with steady gait with no complaints or showing signs of discomfort. Vital Signs (O2) remain stable ranging between 96%-90%.

## 2023-03-26 NOTE — ED Notes (Signed)
Attempted to check pulse ox while walking x 2 now, pt states that she needs a few min and refuses to do so even after a lot of encouragement

## 2023-03-26 NOTE — ED Provider Notes (Signed)
 Care of patient assumed from Dr. Fabiola.  This patient presented earlier in the day for dizziness.  She has had negative workup, including MRI.  Dizziness has resolved.  She did have some mild hypoxia while in the ED.  It is unknown if this is chronic for her.  Patient to undergo CTA chest and trial of ambulation. Physical Exam  BP 115/64   Pulse 71   Temp 97.9 F (36.6 C) (Oral)   Resp 19   Ht 5' 7 (1.702 m)   Wt 57.6 kg   SpO2 92%   BMI 19.89 kg/m   Physical Exam Vitals and nursing note reviewed.  Constitutional:      General: She is not in acute distress.    Appearance: Normal appearance. She is well-developed. She is not ill-appearing, toxic-appearing or diaphoretic.  HENT:     Head: Normocephalic and atraumatic.     Right Ear: External ear normal.     Left Ear: External ear normal.     Nose: Nose normal.     Mouth/Throat:     Mouth: Mucous membranes are moist.  Eyes:     Extraocular Movements: Extraocular movements intact.     Conjunctiva/sclera: Conjunctivae normal.  Cardiovascular:     Rate and Rhythm: Normal rate and regular rhythm.  Pulmonary:     Effort: Pulmonary effort is normal. No respiratory distress.  Abdominal:     General: There is no distension.     Palpations: Abdomen is soft.  Musculoskeletal:        General: No swelling. Normal range of motion.     Cervical back: Normal range of motion and neck supple.  Skin:    General: Skin is warm and dry.     Capillary Refill: Capillary refill takes less than 2 seconds.     Coloration: Skin is not jaundiced or pale.  Neurological:     General: No focal deficit present.     Mental Status: She is alert and oriented to person, place, and time.     Cranial Nerves: No cranial nerve deficit.     Sensory: No sensory deficit.     Motor: No weakness.     Coordination: Coordination normal.  Psychiatric:        Mood and Affect: Mood normal.        Behavior: Behavior normal.     Procedures  Procedures  ED  Course / MDM   Clinical Course as of 03/26/23 0421  Mon Mar 25, 2023  1418 Lipase(!): 86 At baseline [RP]  1616 Signed out to Dr Jerrol [RP]    Clinical Course User Index [RP] Yolande Lamar BROCKS, MD   Medical Decision Making Amount and/or Complexity of Data Reviewed Labs: ordered. Decision-making details documented in ED Course. Radiology: ordered.  Risk Prescription drug management.   On assessment, patient resting comfortably.  SpO2 is normal on room air.  She requests discharge home at this time.  Nursing was informed to ambulate and get walking SpO2.  This was performed and patient maintained normal SpO2 while walking.  On CTA chest, patient has areas of bronchiectasis.  This is consistent with documented history in EMR.  She was last seen by pulmonology 2 years ago.  She was treated for [facility] with azithromycin  and rifampin back in 2014.  She was treated for nocardia with Bactrim  for 1 year.  Patient was advised to follow-up with PCP and pulmonology.  She was discharged in stable condition.       Gualberto Wahlen,  Bernardino, MD 03/26/23 [phone]

## 2023-03-29 ENCOUNTER — Inpatient Hospital Stay: Payer: Medicare PPO | Admitting: Primary Care

## 2023-04-01 DIAGNOSIS — E039 Hypothyroidism, unspecified: Secondary | ICD-10-CM | POA: Diagnosis not present

## 2023-04-01 DIAGNOSIS — J449 Chronic obstructive pulmonary disease, unspecified: Secondary | ICD-10-CM | POA: Diagnosis not present

## 2023-04-01 DIAGNOSIS — F324 Major depressive disorder, single episode, in partial remission: Secondary | ICD-10-CM | POA: Diagnosis not present

## 2023-04-01 DIAGNOSIS — I2510 Atherosclerotic heart disease of native coronary artery without angina pectoris: Secondary | ICD-10-CM | POA: Diagnosis not present

## 2023-04-01 DIAGNOSIS — I700 Atherosclerosis of aorta: Secondary | ICD-10-CM | POA: Diagnosis not present

## 2023-04-01 DIAGNOSIS — F331 Major depressive disorder, recurrent, moderate: Secondary | ICD-10-CM | POA: Diagnosis not present

## 2023-04-01 DIAGNOSIS — A310 Pulmonary mycobacterial infection: Secondary | ICD-10-CM | POA: Diagnosis not present

## 2023-04-01 DIAGNOSIS — D0510 Intraductal carcinoma in situ of unspecified breast: Secondary | ICD-10-CM | POA: Diagnosis not present

## 2023-04-01 DIAGNOSIS — I255 Ischemic cardiomyopathy: Secondary | ICD-10-CM | POA: Diagnosis not present

## 2023-04-11 ENCOUNTER — Ambulatory Visit: Payer: Medicare PPO | Admitting: Emergency Medicine

## 2023-04-11 ENCOUNTER — Encounter: Payer: Self-pay | Admitting: Emergency Medicine

## 2023-04-11 VITALS — BP 136/74 | HR 83 | Ht 67.0 in | Wt 130.8 lb

## 2023-04-11 DIAGNOSIS — J479 Bronchiectasis, uncomplicated: Secondary | ICD-10-CM

## 2023-04-11 DIAGNOSIS — J9611 Chronic respiratory failure with hypoxia: Secondary | ICD-10-CM | POA: Diagnosis not present

## 2023-04-11 NOTE — Assessment & Plan Note (Signed)
She is no longer on home oxygen but she was recently in the ER with dizziness/vertigo.  She was not desaturated at that time at rest but we will recheck her today to ensure she does not desaturate with exertion

## 2023-04-11 NOTE — Patient Instructions (Signed)
We reviewed your CT scan of the chest that was done in the emergency department today.  It shows stable chronic signs of inflammation/infection without any evidence of active infection present. We will plan to repeat your CT scan of the chest in 1 year, February 2026 to ensure no interval change.  If you develop any new symptoms including cough, more mucus production, fevers, chills, weight loss then please let us know.  If so then we may decide to repeat your CT scan of the chest sooner. We will perform a walking oximetry test today to ensure that your oxygen levels are not dropping with exertion.  If you qualify for oxygen then we will get this for you. Please follow with Dr. Maple Hudson in about 6 months, sooner if you have any problems.

## 2023-04-11 NOTE — Progress Notes (Signed)
Subjective:    Patient ID: [name], female    DOB: 30-Jul-1940, 83 y.o.   MRN: [id number]  HPI 83 year old never smoker who has been followed by Dr. Maple Hudson in our office for [facility] and bronchiectasis with chronic bronchitic symptoms.  She also has a history of breast cancer, CAD/PTCI, GERD/PUD with prior GI bleeding, hypothyroidism sinusitis, glaucoma, nocardia infection/colonization (status post an extended treatment course).  She was in the emergency department 03/25/2023 with dizziness, question vertigo versus orthopnea.  MRI brain and head CT were both reassuring.  A CT-PA was done 2//25 which I have reviewed and which showed no evidence of pulmonary embolism, bilateral asymmetric cylindrical bronchiectasis especially in the upper lobes and lingula, left lower lobe.  There is associated tree-in-bud nodularity.  No new or concerning nodule seen.    Review of Systems As per HPI  Past Medical History:  Diagnosis Date   Allergy    Anemia    Breast cancer (HCC)    Cataract    COPD (chronic obstructive pulmonary disease) (HCC)    Depression    Essential hypertension 11/29/2014   Glaucoma    Hemoptysis    Hypothyroidism    Intractable nausea and vomiting 07/23/2022   Left bundle branch block 03/16/2005   Oxygen deficiency    patient was using oxygen at home, her pulmonologist discontinued it and patient stopped using it on Monday Mar 20, 2015   Personal history of radiation therapy    Restless leg syndrome    Sinusitis    Vertigo      Family History  Problem Relation Age of Onset   Emphysema Mother        smoker   Heart disease Mother    COPD Mother    Colon polyps Mother    Emphysema Father        smoker   Heart disease Father    Breast cancer Sister    Colon cancer Neg Hx    Esophageal cancer Neg Hx    Stomach cancer Neg Hx    Rectal cancer Neg Hx      Social History   Socioeconomic History   Marital status: Single    Spouse name: Not on file   Number of  children: Not on file   Years of education: Not on file   Highest education level: Not on file  Occupational History   Occupation: retired    Associate Professor: [employer]  Tobacco Use   Smoking status: Never   Smokeless tobacco: Never  Vaping Use   Vaping status: Never Used  Substance and Sexual Activity   Alcohol use: No    Alcohol/week: 0.0 standard drinks of alcohol   Drug use: No   Sexual activity: Not on file  Other Topics Concern   Not on file  Social History Narrative   Not on file   Social Drivers of Health   Financial Resource Strain: Not on file  Food Insecurity: No Food Insecurity (08/10/2022)   Hunger Vital Sign    Worried About Running Out of Food in the Last Year: Never true    Ran Out of Food in the Last Year: Never true  Transportation Needs: No Transportation Needs (08/10/2022)   PRAPARE - Administrator, Civil Service (Medical): No    Lack of Transportation (Non-Medical): No  Physical Activity: Not on file  Stress: Not on file  Social Connections: Unknown (02/05/2022)   Received [facility], [facility]   Social  Network    Social Network: Not on file  Intimate Partner Violence: Not At Risk (08/10/2022)   Humiliation, Afraid, Rape, and Kick questionnaire    Fear of Current or Ex-Partner: No    Emotionally Abused: No    Physically Abused: No    Sexually Abused: No     Allergies  Allergen Reactions   Augmentin [Amoxicillin-Pot Clavulanate] Nausea And Vomiting    .Marland KitchenHas patient had a PCN reaction causing immediate rash, facial/tongue/throat swelling, SOB or lightheadedness with hypotension: No Has patient had a PCN reaction causing severe rash involving mucus membranes or skin necrosis: No Has patient had a PCN reaction that required hospitalization No Has patient had a PCN reaction occurring within the last 10 years: No If all of the above answers are "NO", then may proceed with Cephalosporin use.    Black Cohosh Nausea And Vomiting     Severe GI Upset, sweats   Codeine Nausea And Vomiting    Severe GI upset, sweats   Hyoscyamine Other (See Comments)    Cramps, made urinary symptoms worse   Oxybutynin Chloride Other (See Comments)    Cramps, made urinary symptoms worse   Minocycline Other (See Comments)    Scratchy tongue, swollen lips     Outpatient Medications Prior to Visit  Medication Sig Dispense Refill   albuterol (VENTOLIN HFA) 108 (90 Base) MCG/ACT inhaler Inhale 2 puffs into the lungs every 6 (six) hours as needed for wheezing or shortness of breath. 8 g 12   azelastine (ASTELIN) 0.1 % nasal spray 1-2 puffs each nostril twice daily if needed (Patient taking differently: Place 1-2 sprays into both nostrils daily as needed for rhinitis.) 30 mL 12   Cholecalciferol (VITAMIN D) 50 MCG (2000 UT) tablet Take 2,000 Units by mouth daily.     clopidogrel (PLAVIX) 75 MG tablet Take 1 tablet (75 mg total) by mouth daily with breakfast. 30 tablet 6   latanoprost (XALATAN) 0.005 % ophthalmic solution Place 1 drop into both eyes at bedtime.     Multiple Minerals (CALCIUM/MAGNESIUM/ZINC) TABS Take 3 tablets by mouth at bedtime.     TRELEGY ELLIPTA 100-62.5-25 MCG/ACT AEPB Inhale 1 puff into the lungs daily.     vitamin C (ASCORBIC ACID) 500 MG tablet Take 500 mg by mouth daily.     vitamin E 400 UNIT capsule Take 400 Units by mouth daily.     benzonatate (TESSALON) 200 MG capsule Take 1 capsule (200 mg total) by mouth 3 (three) times daily as needed for cough. (Patient not taking: Reported on 04/11/2023) 30 capsule 1   EUTHYROX 75 MCG tablet Take 75 mcg by mouth every morning. (Patient not taking: Reported on 04/11/2023)     famotidine (PEPCID) 20 MG tablet Take 1 tablet (20 mg total) by mouth 2 (two) times daily for 7 days. 14 tablet 0   pantoprazole (PROTONIX) 40 MG tablet Take 1 tablet (40 mg total) by mouth 2 (two) times daily before a meal. For a week then take daily (Patient not taking: Reported on 04/11/2023) 60 tablet 2    sucralfate (CARAFATE) 1 GM/10ML suspension Take 10 mLs (1 g total) by mouth 4 (four) times daily -  with meals and at bedtime. For a week then as needed. (Patient not taking: Reported on 04/11/2023) 420 mL 2   No facility-administered medications prior to visit.        Objective:   Physical Exam Vitals:   04/11/23 1342  BP: 136/74  Pulse: 83  SpO2:  95%  Weight: 130 lb 12.8 oz (59.3 kg)  Height: 5\' 7"  (1.702 m)   Gen: Pleasant, thin woman, in no distress,  normal affect  ENT: No lesions,  mouth clear,  oropharynx clear, no postnasal drip  Neck: No JVD, no stridor  Lungs: No use of accessory muscles, few scattered inspiratory crackles, no wheezes  Cardiovascular: RRR, heart sounds normal, no murmur or gallops, no peripheral edema  Musculoskeletal: No deformities, no cyanosis or clubbing  Neuro: alert, awake, non focal  Skin: Warm, no lesions or rash       Assessment & Plan:  Bronchiectasis without acute exacerbation + MAIC I reviewed her CT chest from the ED done 03/26/2023 which showed stable changes including bronchiectasis, some scattered scarring, calcified pulmonary nodular disease.  There was also some subtle micronodular tree/bud change at the left base.  I do not see anything to suggest active or progressive MAIC.  No evidence for malignancy.  There was no pulmonary embolism to explain her dizziness or exertional symptoms.  I think we should repeat her CT scan of the chest in 1 year.  She can follow this up with either me or Dr. Maple Hudson.  Certainly if she develops symptoms consistent with respiratory infection, bronchiectatic flare or even constitutional symptoms then we could consider repeating her CT scan earlier.  Chronic respiratory failure with hypoxia (HCC) She is no longer on home oxygen but she was recently in the ER with dizziness/vertigo.  She was not desaturated at that time at rest but we will recheck her today to ensure she does not desaturate with  exertion   Levy Pupa, MD, PhD 04/11/2023, 4:56 PM [facility] [phone] or if no answer before 7:00PM call [phone] For any issues after 7:00PM please call eLink [phone]

## 2023-04-11 NOTE — Assessment & Plan Note (Signed)
I reviewed her CT chest from the ED done 03/26/2023 which showed stable changes including bronchiectasis, some scattered scarring, calcified pulmonary nodular disease.  There was also some subtle micronodular tree/bud change at the left base.  I do not see anything to suggest active or progressive MAIC.  No evidence for malignancy.  There was no pulmonary embolism to explain her dizziness or exertional symptoms.  I think we should repeat her CT scan of the chest in 1 year.  She can follow this up with either me or Dr. Maple Hudson.  Certainly if she develops symptoms consistent with respiratory infection, bronchiectatic flare or even constitutional symptoms then we could consider repeating her CT scan earlier.

## 2023-04-12 NOTE — Progress Notes (Signed)
 To test for infection

## 2023-07-26 ENCOUNTER — Encounter (HOSPITAL_COMMUNITY): Payer: Self-pay

## 2023-07-26 ENCOUNTER — Other Ambulatory Visit: Payer: Self-pay

## 2023-07-26 ENCOUNTER — Emergency Department (HOSPITAL_COMMUNITY)
Admission: EM | Admit: 2023-07-26 | Discharge: 2023-07-27 | Disposition: A | Discharge status: Home / Self Care | Attending: Emergency Medicine | Admitting: Emergency Medicine

## 2023-07-26 DIAGNOSIS — R112 Nausea with vomiting, unspecified: Secondary | ICD-10-CM | POA: Diagnosis not present

## 2023-07-26 DIAGNOSIS — N390 Urinary tract infection, site not specified: Secondary | ICD-10-CM | POA: Diagnosis not present

## 2023-07-26 DIAGNOSIS — J449 Chronic obstructive pulmonary disease, unspecified: Secondary | ICD-10-CM | POA: Diagnosis not present

## 2023-07-26 DIAGNOSIS — E039 Hypothyroidism, unspecified: Secondary | ICD-10-CM | POA: Insufficient documentation

## 2023-07-26 DIAGNOSIS — R748 Abnormal levels of other serum enzymes: Secondary | ICD-10-CM | POA: Insufficient documentation

## 2023-07-26 DIAGNOSIS — Z7951 Long term (current) use of inhaled steroids: Secondary | ICD-10-CM | POA: Diagnosis not present

## 2023-07-26 DIAGNOSIS — Z7902 Long term (current) use of antithrombotics/antiplatelets: Secondary | ICD-10-CM | POA: Insufficient documentation

## 2023-07-26 DIAGNOSIS — I10 Essential (primary) hypertension: Secondary | ICD-10-CM | POA: Diagnosis not present

## 2023-07-26 DIAGNOSIS — Z79899 Other long term (current) drug therapy: Secondary | ICD-10-CM | POA: Insufficient documentation

## 2023-07-26 DIAGNOSIS — Z853 Personal history of malignant neoplasm of breast: Secondary | ICD-10-CM | POA: Insufficient documentation

## 2023-07-26 DIAGNOSIS — K8000 Calculus of gallbladder with acute cholecystitis without obstruction: Secondary | ICD-10-CM | POA: Diagnosis not present

## 2023-07-26 DIAGNOSIS — R935 Abnormal findings on diagnostic imaging of other abdominal regions, including retroperitoneum: Secondary | ICD-10-CM | POA: Diagnosis not present

## 2023-07-26 LAB — COMPREHENSIVE METABOLIC PANEL WITH GFR
ALT: 17 U/L (ref 0–44)
AST: 27 U/L (ref 15–41)
Albumin: 4.3 g/dL (ref 3.5–5.0)
Alkaline Phosphatase: 113 U/L (ref 38–126)
Anion gap: 12 (ref 5–15)
BUN: 16 mg/dL (ref 8–23)
CO2: 26 mmol/L (ref 22–32)
Calcium: 9.5 mg/dL (ref 8.9–10.3)
Chloride: 100 mmol/L (ref 98–111)
Creatinine, Ser: 0.76 mg/dL (ref 0.44–1.00)
GFR, Estimated: >60 mL/min (ref >60)
Glucose, Bld: 134 mg/dL — ABNORMAL HIGH (ref 70–99)
Potassium: 3.5 mmol/L (ref 3.5–5.1)
Sodium: 138 mmol/L (ref 135–145)
Total Bilirubin: 0.7 mg/dL (ref 0.0–1.2)
Total Protein: 8.5 g/dL — ABNORMAL HIGH (ref 6.5–8.1)

## 2023-07-26 LAB — CBC
HCT: 47.4 % — ABNORMAL HIGH (ref 36.0–46.0)
Hemoglobin: 15.5 g/dL — ABNORMAL HIGH (ref 12.0–15.0)
MCH: 32.6 pg (ref 26.0–34.0)
MCHC: 32.7 g/dL (ref 30.0–36.0)
MCV: 99.8 fL (ref 80.0–100.0)
Platelets: 237 10*3/uL (ref 150–400)
RBC: 4.75 MIL/uL (ref 3.87–5.11)
RDW: 12.7 % (ref 11.5–15.5)
WBC: 10 10*3/uL (ref 4.0–10.5)
nRBC: 0 % (ref 0.0–0.2)

## 2023-07-26 LAB — LIPASE, BLOOD: Lipase: 204 U/L — ABNORMAL HIGH (ref 11–51)

## 2023-07-26 MED ORDER — ONDANSETRON 4 MG PO TBDP
4.0000 mg | ORAL_TABLET | Freq: Once | ORAL | Status: AC | PRN
  Administered 2023-07-26: 4 mg via ORAL
  Filled 2023-07-26: qty 1

## 2023-07-26 NOTE — ED Provider Triage Note (Signed)
 Emergency Medicine Provider Triage Evaluation Note  [name] , a 83 y.o. female  was evaluated in triage.  Pt complains of vomiting at home, multiple times in the last few hours. Patient very weak according to son, states this same thing happened in January and that patient was dehydrated. Son states that patients pee is very yellow. Patient states that patient was short of breath due to vomiting and not at any other time. Hx of COPD. Denies headache.   Review of Systems  Positive: Vomiting, dizziness Negative: Fever, chills, abdominal pain, chest pain, shortness of breath  Physical Exam  BP (!) 141/67   Pulse 84   Temp 97.9 F (36.6 C) (Oral)   Resp 15   Ht 5\' 7"  (1.702 m)   SpO2 98%   BMI 20.49 kg/m  Gen:   Awake, no distress   Resp:  Normal effort, lungs sound clear to auscultation MSK:   Moves extremities without difficulty  Other:  Heart sounds normal, EOMS intact, patient alert and oriented, grossly normal neuro exam  Medical Decision Making  Medically screening exam initiated at 9:21 PM.  Appropriate orders placed.  [name] was informed that the remainder of the evaluation will be completed by another provider, this initial triage assessment does not replace that evaluation, and the importance of remaining in the ED until their evaluation is complete.  Orders include CBC, CMP, lipase, UA, Zofran , diet NPO   Jaksen Fiorella F, PA-C 07/26/23 2133

## 2023-07-26 NOTE — ED Triage Notes (Signed)
 Son reports he thinks his mom is severely dehydrated. Reports vomiting, headache and cough. States she had this in January and it was due to dehydration. Denies changes in diet. States she does not eat alot during the meal and she does not drink enough water. States her urine in very yellow in color.

## 2023-07-27 ENCOUNTER — Emergency Department (HOSPITAL_COMMUNITY)

## 2023-07-27 DIAGNOSIS — R935 Abnormal findings on diagnostic imaging of other abdominal regions, including retroperitoneum: Secondary | ICD-10-CM | POA: Diagnosis not present

## 2023-07-27 DIAGNOSIS — K8000 Calculus of gallbladder with acute cholecystitis without obstruction: Secondary | ICD-10-CM | POA: Diagnosis not present

## 2023-07-27 LAB — URINALYSIS, ROUTINE W REFLEX MICROSCOPIC
Bilirubin Urine: NEGATIVE
Glucose, UA: NEGATIVE mg/dL
Hgb urine dipstick: NEGATIVE
Ketones, ur: NEGATIVE mg/dL
Nitrite: POSITIVE — AB
Protein, ur: NEGATIVE mg/dL
Specific Gravity, Urine: 1.008 (ref 1.005–1.030)
pH: 8 (ref 5.0–8.0)

## 2023-07-27 MED ORDER — CEFADROXIL 500 MG PO CAPS: 500.0000 mg | ORAL_CAPSULE | Freq: Two times a day (BID) | ORAL | 0 refills | Status: AC

## 2023-07-27 MED ORDER — SODIUM CHLORIDE 0.9 % IV SOLN
1.0000 g | Freq: Once | INTRAVENOUS | Status: AC
  Administered 2023-07-27: 1 g via INTRAVENOUS
  Filled 2023-07-27: qty 10

## 2023-07-27 MED ORDER — IOHEXOL 300 MG/ML  SOLN
100.0000 mL | Freq: Once | INTRAMUSCULAR | Status: AC | PRN
  Administered 2023-07-27: 100 mL via INTRAVENOUS

## 2023-07-27 MED ORDER — SODIUM CHLORIDE 0.9 % IV BOLUS
1000.0000 mL | Freq: Once | INTRAVENOUS | Status: AC
  Administered 2023-07-27: 1000 mL via INTRAVENOUS

## 2023-07-27 MED ORDER — ONDANSETRON 4 MG PO TBDP: 4.0000 mg | ORAL_TABLET | Freq: Three times a day (TID) | ORAL | 0 refills | Status: AC | PRN

## 2023-07-27 NOTE — ED Provider Notes (Signed)
 [place] EMERGENCY DEPARTMENT AT [facility] Provider Note  CSN: [id number] Arrival date & time: 07/26/23 2053  Chief Complaint(s) No chief complaint on file.  HPI [name] is a 83 y.o. female with a past medical history listed below who presents to the emergency department with nausea and vomiting that began earlier this evening around 5 to 6 PM.  Endorsed some shortness of breath but that improved after the pain resolved.  Currently denies any abdominal pain.  Patient is accompanied by her son who reports similar presentation in the past related to dehydration.  He reports that she does not eat or hydrate very well.  The history is provided by the patient and a relative.    Past Medical History Past Medical History:  Diagnosis Date   Allergy     Anemia    Breast cancer (HCC)    Cataract    COPD (chronic obstructive pulmonary disease) (HCC)    Depression    Essential hypertension 11/29/2014   Glaucoma    Hemoptysis    Hypothyroidism    Intractable nausea and vomiting 07/23/2022   Left bundle branch block 03/16/2005   Oxygen  deficiency    patient was using oxygen  at home, her pulmonologist discontinued it and patient stopped using it on Monday Mar 20, 2015   Personal history of radiation therapy    Restless leg syndrome    Sinusitis    Vertigo    Patient Active Problem List   Diagnosis Date Noted   Acute lower UTI 08/10/2022   Depression 08/10/2022   Hypokalemia 08/10/2022   Epigastric pain 08/10/2022   Acute gastritis without bleeding 08/09/2022   AKI (acute kidney injury) (HCC) 07/25/2022   Asymptomatic bacteriuria 07/25/2022   Coffee ground emesis 07/24/2022   Acute blood loss anemia 07/24/2022   Acute gastric ulcer 07/24/2022   Duodenal ulcer hemorrhage 07/24/2022   Intractable nausea and vomiting 07/23/2022   HLD (hyperlipidemia) 11/02/2021   Status post coronary artery stent placement    NSTEMI (non-ST elevated myocardial infarction)  (HCC)    Chest pain, rule out acute myocardial infarction 10/21/2021   Onychomycosis 10/11/2021   Ductal carcinoma in situ (DCIS) of left breast 03/29/2020   Malignant neoplasm of upper-outer quadrant of right breast in female, estrogen receptor positive (HCC) 11/17/2019   Rhinitis, nonallergic, chronic 11/23/2016   Chronic respiratory failure with hypoxia (HCC) 11/13/2015   Essential hypertension 11/29/2014   Allergic sinusitis 07/29/2013   Sinusitis, chronic 04/13/2013   Cough with hemoptysis 08/01/2010   Bronchiectasis without acute exacerbation + MAIC 08/01/2010   Hypothyroidism 12/17/2006   Lung nodules 12/17/2006   DIVERTICULITIS, ACUTE 12/17/2006   Home Medication(s) Prior to Admission medications   Medication Sig Start Date End Date Taking? Authorizing Provider  cefadroxil (DURICEF) 500 MG capsule Take 1 capsule (500 mg total) by mouth 2 (two) times daily for 7 days. 07/27/23 08/03/23 Yes Fate Galanti, Camila Cecil, MD  ondansetron  (ZOFRAN -ODT) 4 MG disintegrating tablet Take 1 tablet (4 mg total) by mouth every 8 (eight) hours as needed for up to 3 days for nausea or vomiting. 07/27/23 07/30/23 Yes Lamari Beckles, Camila Cecil, MD  albuterol  (VENTOLIN  HFA) 108 [phone] Base) MCG/ACT inhaler Inhale 2 puffs into the lungs every 6 (six) hours as needed for wheezing or shortness of breath. 11/27/18   Rosa College D, MD  azelastine  (ASTELIN ) 0.1 % nasal spray 1-2 puffs each nostril twice daily if needed Patient taking differently: Place 1-2 sprays into both nostrils daily as needed for rhinitis.  11/26/17   Faustina Hood, MD  benzonatate  (TESSALON ) 200 MG capsule Take 1 capsule (200 mg total) by mouth 3 (three) times daily as needed for cough. Patient not taking: Reported on 04/11/2023 05/27/20   Rosa College D, MD  Cholecalciferol (VITAMIN D) 50 MCG (2000 UT) tablet Take 2,000 Units by mouth daily.    [provider]  clopidogrel  (PLAVIX ) 75 MG tablet Take 1 tablet (75 mg total) by mouth daily  with breakfast. 05/11/22   Euell Herrlich, MD  EUTHYROX  75 MCG tablet Take 75 mcg by mouth every morning. Patient not taking: Reported on 04/11/2023 10/11/21   [provider]  famotidine  (PEPCID ) 20 MG tablet Take 1 tablet (20 mg total) by mouth 2 (two) times daily for 7 days. 11/02/22 11/09/22  Mesner, Reymundo Caulk, MD  latanoprost  (XALATAN ) 0.005 % ophthalmic solution Place 1 drop into both eyes at bedtime.    [provider]  Multiple Minerals (CALCIUM /MAGNESIUM /ZINC ) TABS Take 3 tablets by mouth at bedtime.    [provider]  pantoprazole  (PROTONIX ) 40 MG tablet Take 1 tablet (40 mg total) by mouth 2 (two) times daily before a meal. For a week then take daily Patient not taking: Reported on 04/11/2023 11/02/22   Mesner, Reymundo Caulk, MD  sucralfate  (CARAFATE ) 1 GM/10ML suspension Take 10 mLs (1 g total) by mouth 4 (four) times daily -  with meals and at bedtime. For a week then as needed. Patient not taking: Reported on 04/11/2023 11/02/22   Mesner, Reymundo Caulk, MD  TRELEGY ELLIPTA  100-62.5-25 MCG/ACT AEPB Inhale 1 puff into the lungs daily.    [provider]  vitamin C (ASCORBIC ACID) 500 MG tablet Take 500 mg by mouth daily.    [provider]  vitamin E 400 UNIT capsule Take 400 Units by mouth daily.    [provider]                                                                                                                                    Allergies Augmentin [amoxicillin-pot clavulanate], Black cohosh, Codeine, Hyoscyamine, Oxybutynin chloride, and Minocycline   Review of Systems Review of Systems As noted in HPI  Physical Exam Vital Signs  I have reviewed the triage vital signs BP (!) 109/59   Pulse 71   Temp (!) 97.3 F (36.3 C) (Oral)   Resp 18   Ht 5\' 7"  (1.702 m)   Wt 59 kg   SpO2 92%   BMI 20.36 kg/m   Physical Exam Vitals reviewed.  Constitutional:      General: She is not in acute distress.    Appearance: She is  well-developed. She is not diaphoretic.  HENT:     Head: Normocephalic and atraumatic.     Right Ear: External ear normal.     Left Ear: External ear normal.     Nose: Nose normal.  Eyes:     General: No  scleral icterus.    Conjunctiva/sclera: Conjunctivae normal.  Neck:     Trachea: Phonation normal.  Cardiovascular:     Rate and Rhythm: Normal rate and regular rhythm.  Pulmonary:     Effort: Pulmonary effort is normal. No respiratory distress.     Breath sounds: No stridor.  Abdominal:     General: There is no distension.     Tenderness: There is no abdominal tenderness. There is no guarding or rebound.  Musculoskeletal:        General: Normal range of motion.     Cervical back: Normal range of motion.  Neurological:     Mental Status: She is alert and oriented to person, place, and time.  Psychiatric:        Behavior: Behavior normal.     ED Results and Treatments Labs (all labs ordered are listed, but only abnormal results are displayed) Labs Reviewed  LIPASE, BLOOD - Abnormal; Notable for the following components:      Result Value   Lipase 204 (*)    All other components within normal limits  COMPREHENSIVE METABOLIC PANEL WITH GFR - Abnormal; Notable for the following components:   Glucose, Bld 134 (*)    Total Protein 8.5 (*)    All other components within normal limits  CBC - Abnormal; Notable for the following components:   Hemoglobin 15.5 (*)    HCT 47.4 (*)    All other components within normal limits  URINALYSIS, ROUTINE W REFLEX MICROSCOPIC - Abnormal; Notable for the following components:   APPearance HAZY (*)    Nitrite POSITIVE (*)    Leukocytes,Ua LARGE (*)    Bacteria, UA RARE (*)    All other components within normal limits                                                                                                                         EKG  EKG Interpretation Date/Time:    Ventricular Rate:    PR Interval:    QRS Duration:    QT  Interval:    QTC Calculation:   R Axis:      Text Interpretation:         Radiology CT ABDOMEN PELVIS W CONTRAST Result Date: 07/27/2023 CLINICAL DATA:  Per triage note, multiple episodes of vomiting. Weakness EXAM: CT ABDOMEN AND PELVIS WITH CONTRAST TECHNIQUE: Multidetector CT imaging of the abdomen and pelvis was performed using the standard protocol following bolus administration of intravenous contrast. RADIATION DOSE REDUCTION: This exam was performed according to the departmental dose-optimization program which includes automated exposure control, adjustment of the mA and/or kV according to patient size and/or use of iterative reconstruction technique. CONTRAST:  OMNIPAQUE  IOHEXOL  300 MG/ML  SOLN COMPARISON:  CT abdomen pelvis 08/09/2022 FINDINGS: Lower chest: No acute abnormality. Similar centrilobular micro nodules and tree-in-bud nodularity in the left lower lobe. Hepatobiliary: Cholelithiasis. No focal hepatic lesion. No biliary dilation. Evidence of acute cholecystitis. Pancreas: Vague hypodensity in the  pancreatic head (series 10/image 41) measuring 11 x 9 mm. Interval increase in ductal dilation in the atrophic pancreatic tail measuring 1.1 cm previously 0.7 cm. No peripancreatic fluid or stranding. Nonemergent MRI with and without IV contrast is recommended for further evaluation. Spleen: Unremarkable. Adrenals/Urinary Tract: Stable adrenal glands. No urinary calculi or hydronephrosis. Unremarkable bladder. Stomach/Bowel: Normal caliber large and small bowel. Question wall thickening and edema about the gastric antrum versus decompression. This is less conspicuous compared to 08/09/2022. Otherwise no bowel wall thickening. Appendectomy. Vascular/Lymphatic: Aortic atherosclerosis. No enlarged abdominal or pelvic lymph nodes. Reproductive: Hysterectomy.  No adnexal mass. Other: No free intraperitoneal fluid or air. Musculoskeletal: No acute fracture. IMPRESSION: 1. Question gastritis of  the gastric antrum versus decompression. This is less conspicuous compared to 08/09/2022. 2. Vague hypodensity in the pancreatic head measuring 11 x 9 mm. Interval increase in ductal dilation in the atrophic pancreatic tail measuring 1.1 cm previously 0.7 cm. Nonemergent MRI with and without IV contrast is recommended for further evaluation. 3. Cholelithiasis. 4. Chronic small airway infection/inflammation in the left lower lobe. 5. Aortic Atherosclerosis (ICD10-I70.0). Electronically Signed   By: Rozell Cornet M.D.   On: 07/27/2023 02:09    Medications Ordered in ED Medications  ondansetron  (ZOFRAN -ODT) disintegrating tablet 4 mg (4 mg Oral Given 07/26/23 2134)  sodium chloride  0.9 % bolus 1,000 mL (0 mLs Intravenous Stopped 07/27/23 0347)  iohexol  (OMNIPAQUE ) 300 MG/ML solution 100 mL (100 mLs Intravenous Contrast Given 07/27/23 0139)  cefTRIAXone  (ROCEPHIN ) 1 g in sodium chloride  0.9 % 100 mL IVPB (0 g Intravenous Stopped 07/27/23 0314)   Procedures Procedures  (including critical care time) Medical Decision Making / ED Course   Medical Decision Making Amount and/or Complexity of Data Reviewed Labs: ordered. Decision-making details documented in ED Course. Radiology: ordered and independent interpretation performed. Decision-making details documented in ED Course.  Risk Prescription drug management.    Nausea vomiting Differential diagnosis considered.  CBC without leukocytosis.  Sample appears to be hemoconcentrated. CMP without significant electrolyte derangements or renal insufficiency.  No evidence of biliary obstruction.  Lipase elevated just above 200.  UA with evidence of infection.  Patient given first dose of antibiotics in the emergency department.  CT scan obtained given the lack of ultrasound at this time and noted hypodense lesion in the pancreas with ductal dilatation.  Also noted cholelithiasis.  Etiology of the pancreatic lesion undetermined at this time.  Given patient's  reassuring exam will have patient follow-up closely with PCP for MRI.  Patient was able to tolerate p.o. and felt to be appropriate for continued outpatient management.    Final Clinical Impression(s) / ED Diagnoses Final diagnoses:  Acute lower UTI  Nausea and vomiting in adult   The patient appears reasonably screened and/or stabilized for discharge and I doubt any other medical condition or other [facility] requiring further screening, evaluation, or treatment in the ED at this time. I have discussed the findings, Dx and Tx plan with the patient/family who expressed understanding and agree(s) with the plan. Discharge instructions discussed at length. The patient/family was given strict return precautions who verbalized understanding of the instructions. No further questions at time of discharge.  Disposition: Discharge  Condition: Good  ED Discharge Orders          Ordered    cefadroxil (DURICEF) 500 MG capsule  2 times daily        07/27/23 0543    ondansetron  (ZOFRAN -ODT) 4 MG disintegrating tablet  Every 8 hours PRN  07/27/23 0543             Follow Up: Olin Bertin, MD [address] [place] [address] [phone]  Call  to schedule an appointment for close follow up to obtain MRI of pancreatic finding    This chart was dictated using voice recognition software.  Despite best efforts to proofread,  errors can occur which can change the documentation meaning.    Lindle Rhea, MD 07/27/23 [phone]

## 2023-07-27 NOTE — ED Notes (Signed)
 Patient walked to the bathroom with assistance with NT.

## 2023-07-27 NOTE — ED Notes (Signed)
 Patient back from CT scan and assisted to the bathroom

## 2023-07-27 NOTE — Discharge Instructions (Addendum)
 Thank you for allowing us  to take care of you today.  We hope you begin feeling better soon.  To-Do: Please follow-up with your primary doctor. CT findings:Vague hypodensity in the pancreatic head measuring 11 x 9 mm. Interval increase in ductal dilation in the atrophic pancreatic tail measuring 1.1 cm previously 0.7 cm. Nonemergent MRI with and without IV contrast is recommended for further evaluation. Please return to the Emergency Department or call 911 if you experience chest pain, shortness of breath, severe pain, severe fever, altered mental status, or have any reason to think that you need emergency medical care.  Thank you again.  Hope you feel better soon.  Department of Emergency Medicine [facility]

## 2023-08-12 DIAGNOSIS — K869 Disease of pancreas, unspecified: Secondary | ICD-10-CM | POA: Diagnosis not present

## 2023-08-13 ENCOUNTER — Other Ambulatory Visit: Payer: Self-pay | Admitting: Family Medicine

## 2023-08-13 DIAGNOSIS — K869 Disease of pancreas, unspecified: Secondary | ICD-10-CM

## 2023-08-29 ENCOUNTER — Ambulatory Visit
Admission: RE | Admit: 2023-08-29 | Discharge: 2023-08-29 | Disposition: A | Discharge status: Home / Self Care | Source: Ambulatory Visit | Attending: Family Medicine | Admitting: Family Medicine

## 2023-08-29 DIAGNOSIS — K8689 Other specified diseases of pancreas: Secondary | ICD-10-CM | POA: Diagnosis not present

## 2023-08-29 DIAGNOSIS — K8020 Calculus of gallbladder without cholecystitis without obstruction: Secondary | ICD-10-CM | POA: Diagnosis not present

## 2023-08-29 DIAGNOSIS — K869 Disease of pancreas, unspecified: Secondary | ICD-10-CM

## 2023-08-29 MED ORDER — GADOPICLENOL 0.5 MMOL/ML IV SOLN
6.0000 mL | Freq: Once | INTRAVENOUS | Status: AC | PRN
  Administered 2023-08-29: 6 mL via INTRAVENOUS

## 2023-10-07 NOTE — Progress Notes (Unsigned)
 Patient ID: [name], female    DOB: September 25, 1940, 83 y.o.   MRN: [id number]   HPI  F never smoker with hx bronchitis/bronchiectasis and chronic recurrent hemoptysis,  + MAIC.. 09/12/12.complicated by sinusitis, Nocardia,  glaucoma, hx sinusistis  Rx: 02/10/13- zith 500 mg TIW, EMB 1200 TIW, Rif 300 mg TIW. Ended by ID. Nocardia Rx'd Bactrim  x 1 year Office spirometry 06/29/2014-moderate obstructive airways disease, FVC 2.17/69%, FEV1 1.39/60%, FEV1/FVC 64%, FEF 25-75 percent 0.75/4   ----------------------------------------------------------------------   05/30/21-83 year old female never smoker followed for COPD/ chronic Bonchitis/Bronchiectasis/ MAIC, chronic recurrent hemoptysis, Allergic Rhinitis,  Glaucoma, Hypothyroid, Breast Cancer R,&L/ XRT,   positive MAIC 09/12/2012 complicated by Nocardia, history sinusitis Rx: 02/10/13- zith 500 mg TIW, EMB 1200 TIW, Rif 300 mg TIW. Ended by ID. Nocardia Rx'd Bactrim  x 1 year -Ventolin  hfa, Astelin  nasal, Trelegy 100,  Covid vax- 3 Phizer Flu vax-had -----Patient is feeling good, no concerns Recent bronchitis resolved with antibiotic from primary physician.  Still has a little residual light cough-dry.  Not using any inhalers currently because she does not think she needs them.  Not short of breath.  Denies night sweats or adenopathy. CXR 05/27/20-  IMPRESSION: 1. Chronic lung disease with multifocal scarring. There are patchy bibasilar opacities. On the left this is similar to October 2021 radiograph, but progressed on the right. These findings are progressive from May 2020 CT. This is favored to represent progression of indolent infection such as mycobacterium avium complex. Possibility of superimposed acute infection is also considered. 2. Patient's known pulmonary nodules are not well seen by radiograph.  LOV 04/11/23- Dr  Byrum>>  We reviewed your CT scan of the chest that was done in the emergency department today.  It shows stable  chronic signs of inflammation/infection without any evidence of active infection present. We will plan to repeat your CT scan of the chest in 1 year, February 2026 to ensure no interval change.  If you develop any new symptoms including cough, more mucus production, fevers, chills, weight loss then please let us  know.  If so then we may decide to repeat your CT scan of the chest sooner. We will perform a walking oximetry test today to ensure that your oxygen  levels are not dropping with exertion.  If you qualify for oxygen  then we will get this for you. Please follow with Dr. Neysa in about 6 months, sooner if you have any problems.   10/10/23- 83 year old female never smoker followed for COPD/ chronic Bonchitis/Bronchiectasis/ MAIC, chronic recurrent hemoptysis, Allergic Rhinitis,  Glaucoma, Hypothyroid, Breast Cancer R,&L/ XRT,   positive MAIC 09/12/2012 complicated by Nocardia, history sinusitis Rx: 02/10/13- zith 500 mg TIW, EMB 1200 TIW, Rif 300 mg TIW. Ended by ID. Nocardia Rx'd Bactrim  x 1 year -Ventolin  hfa, Astelin  nasal, Trelegy 100,  Dr Shelah ha ordered f/u chest CT for Feb, 2026. Dr Verena is assessing abnormal Pancreas on CT/MRI. Referred to GI. Discussed the use of AI scribe software for clinical note transcription with the patient, who gave verbal consent to proceed.  History of Present Illness   [name] is an 83 year old female who presents for a follow-up on her breathing and general health. She has hx atypical AFB, treated in the past, with bronchiectasis.   Her breathing remains stable with no worsening of her cough. She experiences a mild dry cough occasionally and sometimes expectorates a small amount of phlegm. There are no night sweats or significant changes in shortness of breath when walking short  distances, although she cannot walk very far, which is consistent with her baseline.  She uses Trelegy and an albuterol  inhaler as needed, but not daily. Changes in  weather can cause nasal congestion, which she finds common.  She tries to walk every day to maintain her stamina and emphasizes the importance of not sitting all the time.     Assessment and Plan:    Chronic obstructive pulmonary disease (COPD) COPD well-controlled, occasional dry cough, no significant dyspnea. - Continue Trelegy and albuterol  inhalers PRN. - Encourage daily walking. - Schedule CT scan in February. - Follow up in six months if stable.     Bronchiectasis without exacerbation Hx Atypical AFB- presume indolent chronic infection - long term surveillance  ROS-see HPI   + = positive Constitutional:    weight loss, night sweats, fevers, chills, fatigue, lassitude. HEENT:    headaches, difficulty swallowing, tooth/dental problems, sore throat,       sneezing, itching, ear ache, nasal congestion, post nasal drip, snoring CV:    chest pain, orthopnea, PND, swelling in lower extremities, anasarca,                                   dizziness, palpitations Resp:   shortness of breath with exertion or at rest.                productive cough,   non-productive cough, coughing up of blood.              change in color of mucus.  wheezing.   Skin:    rash or lesions. GI:  No-   heartburn, indigestion, abdominal pain, nausea, vomiting, diarrhea,                 change in bowel habits, loss of appetite GU: dysuria, change in color of urine, no urgency or frequency.   flank pain. MS:   joint pain, stiffness, decreased range of motion, back pain. Neuro-     nothing unusual Psych:  change in mood or affect.  depression or anxiety.   memory loss.  OBJ- Physical Exam General- Alert, Oriented, Affect-appropriate, Distress- none acute Skin- rash-none, lesions- none, excoriation- none Lymphadenopathy- none Head- atraumatic            Eyes- Gross vision intact, PERRLA, conjunctivae and secretions clear            Ears- Hearing, canals-normal            Nose- Clear, no-Septal dev, mucus,  polyps, erosion, perforation             Throat- Mallampati II , mucosa clear , drainage- none, tonsils- atrophic Neck- flexible , trachea midline, no stridor , thyroid  nl, carotid no bruit Chest - symmetrical excursion , unlabored           Heart/CV- RRR , no murmur , no gallop  , no rub, nl s1 s2                           - JVD- none , edema- none, stasis changes- none, varices- none           Lung- clear to P&A, wheeze- none, cough- none , dullness-none, rub- none           Chest wall-  Abd-  Br/ Gen/ Rectal- Not done, not indicated Extrem- cyanosis- none, clubbing, none, atrophy- none,  strength- nl Neuro- grossly intact to observation

## 2023-10-10 ENCOUNTER — Encounter: Payer: Self-pay | Admitting: Internal Medicine

## 2023-10-10 ENCOUNTER — Ambulatory Visit: Payer: Medicare PPO | Admitting: Internal Medicine

## 2023-10-10 VITALS — BP 130/76 | HR 79 | Temp 97.5°F | Ht 67.0 in | Wt 141.8 lb

## 2023-10-10 DIAGNOSIS — J479 Bronchiectasis, uncomplicated: Secondary | ICD-10-CM

## 2023-10-10 DIAGNOSIS — J449 Chronic obstructive pulmonary disease, unspecified: Secondary | ICD-10-CM

## 2023-10-10 NOTE — Patient Instructions (Signed)
 Glad you are doing well. Please let us  know if we can help.  You will get a call to schedule the follow-up chest CT in February.

## 2023-10-16 ENCOUNTER — Encounter: Payer: Self-pay | Admitting: Physician Assistant

## 2023-10-16 ENCOUNTER — Ambulatory Visit (INDEPENDENT_AMBULATORY_CARE_PROVIDER_SITE_OTHER): Admitting: Physician Assistant

## 2023-10-16 VITALS — BP 150/60 | HR 72 | Ht 65.75 in | Wt 139.0 lb

## 2023-10-16 DIAGNOSIS — R748 Abnormal levels of other serum enzymes: Secondary | ICD-10-CM

## 2023-10-16 DIAGNOSIS — R9389 Abnormal findings on diagnostic imaging of other specified body structures: Secondary | ICD-10-CM | POA: Diagnosis not present

## 2023-10-16 DIAGNOSIS — Z7901 Long term (current) use of anticoagulants: Secondary | ICD-10-CM

## 2023-10-16 NOTE — Patient Instructions (Signed)
 Office will contact you after Dr Wilhelmenia has reviewed chart with Delon. If you have not heard from our office, but next Friday 10/25/23, please call and ask for Aston Lawhorn or Patty.   _______________________________________________________  If your blood pressure at your visit was 140/90 or greater, please contact your primary care physician to follow up on this.  _______________________________________________________  If you are age 84 or older, your body mass index should be between 23-30. Your Body mass index is 22.61 kg/m. If this is out of the aforementioned range listed, please consider follow up with your Primary Care Provider.  If you are age 49 or younger, your body mass index should be between 19-25. Your Body mass index is 22.61 kg/m. If this is out of the aformentioned range listed, please consider follow up with your Primary Care Provider.   ________________________________________________________  The [facility] providers would like to encourage you to use MYCHART to communicate with providers for non-urgent requests or questions.  Due to long hold times on the telephone, sending your provider a message by [facility] may be a faster and more efficient way to get a response.  Please allow 48 business hours for a response.  Please remember that this is for non-urgent requests.  _______________________________________________________  [facility] is using a team-based approach to care.  Your team is made up of your doctor and two to three APPS. Our APPS (Nurse Practitioners and Physician Assistants) work with your physician to ensure care continuity for you. They are fully qualified to address your health concerns and develop a treatment plan. They communicate directly with your gastroenterologist to care for you. Seeing the Advanced Practice Practitioners on your physician's team can help you by facilitating care more promptly, often allowing for earlier appointments,  access to diagnostic testing, procedures, and other specialty referrals.   Thank you for choosing me and [facility].  Delon Failing, PA-C

## 2023-10-16 NOTE — Progress Notes (Signed)
 Chief Complaint: Abnormal imaging of the pancreas  HPI:    [name] is an 83 year old female with a past medical history as listed below including NSTEMI, CAD status post stent on Plavix  (echo 08/07/2022 with LVEF 40-45%), COPD on oxygen  and multiple others, known to Dr. Legrand, who was referred to me by Verena Mems, MD for a complaint of abnormal imaging of the pancreas.      03/21/2016 colonoscopy with decreased sphincter tone, diverticulosis in the left colon otherwise normal.  No repeat recommended due to age.    6//24 EGD with benign-appearing esophageal stenosis, nonbleeding gastric ulcers and a large duodenal bulb ulcers with pigmented material.  Patient recommended to stay on a PPI twice daily and hold Plavix  for for 7 more days.    08/09/2022 CTAP with contrast with 1.4 cm hypodensity in the tail the pancreas.  No ductal dilation.    10/09/2022 office visit with Vina Dasen.  At that time discussed an upper GI bleed due to large duodenal ulcers, EGD it also showed nonbleeding gastric ulcers.  In the setting of NSAIDs and Plavix .  Patient continued on Pantoprazole  40 twice daily and Carafate .  Hemoglobin stable.    07/26/2023 CBC with minimally elevated hemoglobin and otherwise normal.  CMP with a glucose of 134.  Lipase 204 (86 on 03/25/2023.    07/26/2023 patient seen in the ER for nausea and vomiting and diagnosed with acute lower UTI.  Also imaging as below.    07/27/2023 CT of the abdomen pelvis with contrast for multiple episodes of vomiting and weakness with vague hypodensity in the pancreatic head measuring 11 x 9 mm, interval increase in ductal dilation in the atrophic pancreatic tail measuring 1.1 cm previously 0.7 cm.  No peripancreatic fluid or stranding.  Recommended MRI with and without contrast.  Also question wall thickening and edema about the gastric antrum versus decompression.  Less conspicuous compared to 08/09/2022.  Cholelithiasis.  Chronic small airway  infection/inflammation of left lower lobe.    08/29/2023 MRI/MRCP with dilation of the main pancreatic duct within the pancreatic tail, increased from previous CT, duct measured 10 mm in diameter with dilation extending over several centimeters, abrupt transition in the duct caliber within the pancreatic tail and not associated with an obvious mass lesion.  Restricted diffusion or suspicious enhancement.  Pancreatic head and body unremarkable.  Stomach at that time unremarkable for its degree of distention.  Recommended EUS.  Serum CEA levels.  Versus follow-up MRI in 6 months.    Today, patient presents to clinic accompanied by her sister.  She explains that she is doing well.  She was treated for a UTI after ER visit in June and has had no further symptoms of nausea or vomiting or abdominal pain.  Tells me that she has always had a decreased appetite which is normal for her, some days she will eat and some days she does not feel like eating much.  Denies any abdominal pain, heartburn, reflux or change in bowel habits.  No family history of pancreatic cancer.    Denies fever, chills or weight loss.  Past Medical History:  Diagnosis Date   Allergy     Anemia    Breast cancer (HCC)    Cataract    COPD (chronic obstructive pulmonary disease) (HCC)    Depression    Essential hypertension 11/29/2014   Glaucoma    Hemoptysis    Hypothyroidism    Intractable nausea and vomiting 07/23/2022   Left bundle branch  block 03/16/2005   Oxygen  deficiency    patient was using oxygen  at home, her pulmonologist discontinued it and patient stopped using it on Monday Mar 20, 2015   Personal history of radiation therapy    Restless leg syndrome    Sinusitis    Vertigo     Past Surgical History:  Procedure Laterality Date   ABDOMINAL HYSTERECTOMY     APPENDECTOMY  2009   BIOPSY  07/24/2022   Procedure: BIOPSY;  Surgeon: Albertus Gordy HERO, MD;  Location: [facility];  Service: Gastroenterology;;   BREAST CYST  ASPIRATION Right 06/12/2016   BREAST LUMPECTOMY Right 02/04/2020   BREAST LUMPECTOMY Left 02/04/2020   BREAST LUMPECTOMY WITH RADIOACTIVE SEED AND SENTINEL LYMPH NODE BIOPSY Bilateral 02/04/2020   Procedure: BILATERAL BREAST LUMPECTOMY WITH RADIOACTIVE SEED , RIGHT X 2, LEFT X 1 AND RIGHT SENTINEL LYMPH NODE MAPPING;  Surgeon: Vanderbilt Ned, MD;  Location: [facility] OR;  Service: General;  Laterality: Bilateral;   CORONARY STENT INTERVENTION N/A 10/24/2021   Procedure: CORONARY STENT INTERVENTION;  Surgeon: Swaziland, Peter M, MD;  Location: [facility] INVASIVE CV LAB;  Service: Cardiovascular;  Laterality: N/A;   ESOPHAGOGASTRODUODENOSCOPY N/A 07/24/2022   Procedure: ESOPHAGOGASTRODUODENOSCOPY (EGD);  Surgeon: Albertus Gordy HERO, MD;  Location: [facility];  Service: Gastroenterology;  Laterality: N/A;   LEFT HEART CATH AND CORONARY ANGIOGRAPHY N/A 10/24/2021   Procedure: LEFT HEART CATH AND CORONARY ANGIOGRAPHY;  Surgeon: Swaziland, Peter M, MD;  Location: [facility] INVASIVE CV LAB;  Service: Cardiovascular;  Laterality: N/A;   RE-EXCISION OF BREAST LUMPECTOMY Bilateral 03/03/2020   Procedure: RE-EXCISION BILATERAL BREAST LUMPECTOMY;  Surgeon: Vanderbilt Ned, MD;  Location: [facility];  Service: General;  Laterality: Bilateral;   VESICOVAGINAL FISTULA CLOSURE W/ TAH  1992    Current Outpatient Medications  Medication Sig Dispense Refill   albuterol  (VENTOLIN  HFA) 108 (90 Base) MCG/ACT inhaler Inhale 2 puffs into the lungs every 6 (six) hours as needed for wheezing or shortness of breath. 8 g 12   azelastine  (ASTELIN ) 0.1 % nasal spray 1-2 puffs each nostril twice daily if needed 30 mL 12   benzonatate  (TESSALON ) 200 MG capsule Take 1 capsule (200 mg total) by mouth 3 (three) times daily as needed for cough. 30 capsule 1   Cholecalciferol (VITAMIN D) 50 MCG (2000 UT) tablet Take 2,000 Units by mouth daily.     clopidogrel  (PLAVIX ) 75 MG tablet Take 1 tablet (75 mg total) by mouth daily with breakfast.  (Patient not taking: Reported on 10/10/2023) 30 tablet 6   EUTHYROX  75 MCG tablet Take 75 mcg by mouth every morning.     Multiple Minerals (CALCIUM /MAGNESIUM /ZINC ) TABS Take 3 tablets by mouth at bedtime.     sucralfate  (CARAFATE ) 1 GM/10ML suspension Take 10 mLs (1 g total) by mouth 4 (four) times daily -  with meals and at bedtime. For a week then as needed. 420 mL 2   TRELEGY ELLIPTA  100-62.5-25 MCG/ACT AEPB Inhale 1 puff into the lungs daily. (Patient not taking: Reported on 10/10/2023)     vitamin C (ASCORBIC ACID) 500 MG tablet Take 500 mg by mouth daily.     vitamin E 400 UNIT capsule Take 400 Units by mouth daily.     No current facility-administered medications for this visit.    Allergies as of 10/16/2023 - Review Complete 10/10/2023  Allergen Reaction Noted   Augmentin [amoxicillin-pot clavulanate] Nausea And Vomiting 09/12/2012   Black cohosh Nausea And Vomiting 08/30/2010   Codeine Nausea And Vomiting 10/02/2015  Hyoscyamine Other (See Comments) 09/12/2012   Oxybutynin chloride Other (See Comments) 09/12/2012   Minocycline  Other (See Comments) 06/07/2014    Family History  Problem Relation Age of Onset   Emphysema Mother        smoker   Heart disease Mother    COPD Mother    Colon polyps Mother    Emphysema Father        smoker   Heart disease Father    Breast cancer Sister    Colon cancer Neg Hx    Esophageal cancer Neg Hx    Stomach cancer Neg Hx    Rectal cancer Neg Hx     Social History   Socioeconomic History   Marital status: Single    Spouse name: Not on file   Number of children: Not on file   Years of education: Not on file   Highest education level: Not on file  Occupational History   Occupation: retired    Associate Professor: [employer]  Tobacco Use   Smoking status: Never   Smokeless tobacco: Never  Vaping Use   Vaping status: Never Used  Substance and Sexual Activity   Alcohol use: No    Alcohol/week: 0.0 standard drinks of alcohol   Drug  use: No   Sexual activity: Not on file  Other Topics Concern   Not on file  Social History Narrative   Not on file   Social Drivers of Health   Financial Resource Strain: Not on file  Food Insecurity: No Food Insecurity (08/10/2022)   Hunger Vital Sign    Worried About Running Out of Food in the Last Year: Never true    Ran Out of Food in the Last Year: Never true  Transportation Needs: No Transportation Needs (08/10/2022)   PRAPARE - Administrator, Civil Service (Medical): No    Lack of Transportation (Non-Medical): No  Physical Activity: Not on file  Stress: Not on file  Social Connections: Unknown (02/05/2022)   Received from [facility]   Social Network    Social Network: Not on file  Intimate Partner Violence: Not At Risk (08/10/2022)   Humiliation, Afraid, Rape, and Kick questionnaire    Fear of Current or Ex-Partner: No    Emotionally Abused: No    Physically Abused: No    Sexually Abused: No    Review of Systems:    Constitutional: No weight loss, fever or chills Skin: No rash  Cardiovascular: No chest pain Respiratory: No SOB Gastrointestinal: See HPI and otherwise negative Genitourinary: No dysuria Neurological: No headache, dizziness or syncope Musculoskeletal: No new muscle or joint pain Hematologic: No bleeding  Psychiatric: No history of depression or anxiety   Physical Exam:  Vital signs: BP (!) 150/60 (BP Location: Left Arm, Patient Position: Sitting, Cuff Size: Normal)   Pulse 72   Ht 5' 5.75 (1.67 m) Comment: height measured without shoes  Wt 139 lb (63 kg)   BMI 22.61 kg/m    Constitutional:   Pleasant elderly Caucasian female appears to be in NAD, Well developed, Well nourished, alert and cooperative Head:  Normocephalic and atraumatic. Eyes:   PEERL, EOMI. No icterus. Conjunctiva pink. Ears:  Normal auditory acuity. Neck:  Supple Throat: Oral cavity and pharynx without inflammation, swelling or lesion.  Respiratory:  Respirations even and unlabored. Lungs clear to auscultation bilaterally.   No wheezes, crackles, or rhonchi.  Cardiovascular: Normal S1, S2. No MRG. Regular rate and rhythm. No peripheral edema, cyanosis or pallor.  Gastrointestinal:  Soft, nondistended, nontender. No rebound or guarding. Normal bowel sounds. No appreciable masses or hepatomegaly. Rectal:  Not performed.  Msk:  Symmetrical without gross deformities. Without edema, no deformity or joint abnormality.  Neurologic:  Alert and  oriented x4;  grossly normal neurologically.  Skin:   Dry and intact without significant lesions or rashes. Psychiatric:  Demonstrates good judgement and reason without abnormal affect or behaviors.  RELEVANT LABS AND IMAGING: CBC    Component Value Date/Time   WBC 10.0 07/26/2023 2128   RBC 4.75 07/26/2023 2128   HGB 15.5 (H) 07/26/2023 2128   HGB 14.6 11/18/2019 1210   HGB 13.0 01/13/2007 1357   HCT 47.4 (H) 07/26/2023 2128   HCT 38.1 01/13/2007 1357   PLT 237 07/26/2023 2128   PLT 214 11/18/2019 1210   PLT 210 01/13/2007 1357   MCV 99.8 07/26/2023 2128   MCV 90.5 01/13/2007 1357   MCH 32.6 07/26/2023 2128   MCHC 32.7 07/26/2023 2128   RDW 12.7 07/26/2023 2128   RDW 13.3 01/13/2007 1357   LYMPHSABS 0.9 03/25/2023 1251   LYMPHSABS 1.4 01/13/2007 1357   MONOABS 0.5 03/25/2023 1251   MONOABS 0.4 01/13/2007 1357   EOSABS 0.1 03/25/2023 1251   EOSABS 0.1 01/13/2007 1357   BASOSABS 0.0 03/25/2023 1251   BASOSABS 0.0 01/13/2007 1357    CMP     Component Value Date/Time   NA 138 07/26/2023 2128   K 3.5 07/26/2023 2128   CL 100 07/26/2023 2128   CO2 26 07/26/2023 2128   GLUCOSE 134 (H) 07/26/2023 2128   BUN 16 07/26/2023 2128   CREATININE 0.76 07/26/2023 2128   CREATININE 0.87 11/18/2019 1210   CREATININE 1.03 06/07/2014 1525   CALCIUM  9.5 07/26/2023 2128   PROT 8.5 (H) 07/26/2023 2128   ALBUMIN 4.3 07/26/2023 2128   AST 27 07/26/2023 2128   AST 25 11/18/2019 1210   ALT 17  07/26/2023 2128   ALT 18 11/18/2019 1210   ALKPHOS 113 07/26/2023 2128   BILITOT 0.7 07/26/2023 2128   BILITOT 0.4 11/18/2019 1210   GFRNONAA >60 07/26/2023 2128   GFRNONAA >60 11/18/2019 1210   GFRAA >60 11/18/2019 1210   .Lipase     Component Value Date/Time   LIPASE 204 (H) 07/26/2023 2128     Assessment: 1.  Abnormal imaging of the pancreas: With ductal dilation and a lesion in the tail increase in size over the past year, elevated lipase as below, currently asymptomatic, no family history of pancreatic cancer; concern for neoplasm versus benign process 2.  Elevated lipase: 204 at last check on patient presented to the ER with nausea and vomiting, found to have a UTI which was treated and symptoms were alleviated, likely related to above 3.  COPD on oxygen  at night 4.  CAD status post stent on Plavix   Plan: 1.  Discussed with the patient and her sister that I will let Dr. Wilhelmenia our pancreatic specialist review imaging and decide next best steps.  We could either proceed with EUS and FNA which we discussed in detail or repeat imaging in 6 months.  Will let Dr. Wilhelmenia guide us . 2.  Patient requested that when we decide what we should do we should also alert her son [name] [phone] 3.  If procedures are pursued would need to be done off of Plavix . 4.  Patient to follow in clinic per recommendations from Dr. Wilhelmenia.  Delon Failing, PA-C [facility] 10/16/2023, 9:06 AM  Cc: Verena,  Reena, MD

## 2023-10-17 ENCOUNTER — Telehealth: Payer: Self-pay

## 2023-10-17 ENCOUNTER — Other Ambulatory Visit: Payer: Self-pay

## 2023-10-17 DIAGNOSIS — R9389 Abnormal findings on diagnostic imaging of other specified body structures: Secondary | ICD-10-CM

## 2023-10-17 NOTE — Telephone Encounter (Signed)
 [facility] Pre-operative Risk Assessment     Request for surgical clearance:     Endoscopy Procedure  What type of surgery is being performed?     EUS   When is this surgery scheduled?     12/05/23  What type of clearance is required ?   Pharmacy  Are there any medications that need to be held prior to surgery and how long? Plavix   Practice name and name of physician performing surgery?      [facility]  What is your office phone and fax number?      Phone- [phone]  Fax- [phone]  Anesthesia type (None, local, MAC, general) ?       MAC   Please route your response to Odetta Curly RN

## 2023-10-17 NOTE — Telephone Encounter (Addendum)
   Patient Name: JENNIFE ZAUCHA  DOB: January 11, 1941 MRN: [id number]  Primary Cardiologist: Soyla DELENA Merck, MD  Chart reviewed as part of pre-operative protocol coverage. Given past medical history and time since last visit, based on ACC/AHA guidelines, KENNEDI LIZARDO is at acceptable risk for the planned procedure without further cardiovascular testing.   Per protocol patient can hold Plavix  7 days prior to procedure and should restart postprocedure when surgically safe and hemostasis is achieved.  I will route this recommendation to the requesting party via Epic fax function and remove from pre-op pool.  Please call with questions.  Wyn Raddle, Jackee Shove, NP 10/17/2023,

## 2023-10-17 NOTE — Telephone Encounter (Signed)
-----   Message from [facility] sent at 10/17/2023  9:55 AM EDT ----- Regarding: RE: Abnormal pancreatic imaging Obtain a CA19-9. Schedule EUS upper as able If CA19-9 is abnormal will try to find an earlier slot for procedure as needed. GM ----- Message ----- From: Beather Delon Gibson, PA Sent: 10/16/2023  10:16 AM EDT To: Victory LITTIE Legrand DOUGLAS, MD; Aloha Finner Jr# Subject: Abnormal pancreatic imaging                    Dr. Finner,  Would love you to review this patient of Dr. Clayburn recent imaging of pancreas.  Told her I would let her know next best steps next week after you return.  Thanks for your guidance.  Sincerely, Delon Beather, PA-C

## 2023-10-17 NOTE — Progress Notes (Signed)
 ____________________________________________________________  Attending physician addendum:  Thank you for sending this case to me. I have reviewed the entire note and agree with the plan.  Agree this pancreatic finding needs further evaluation, and Dr. Wilhelmenia responded in separate message stream with advice to get a serum CA 19-9 and schedule her for an EUS with him (off Plavix  5 days prior).   Victory Brand, MD  ____________________________________________________________

## 2023-10-17 NOTE — Telephone Encounter (Signed)
 EUS has been entered for 12/05/23 at 215 pm at [facility] with GM   Plavix  letter has been sent

## 2023-10-18 ENCOUNTER — Other Ambulatory Visit: Payer: Self-pay

## 2023-10-18 NOTE — Telephone Encounter (Signed)
 Left message on machine to call back

## 2023-10-22 DIAGNOSIS — I10 Essential (primary) hypertension: Secondary | ICD-10-CM | POA: Diagnosis not present

## 2023-10-22 DIAGNOSIS — E785 Hyperlipidemia, unspecified: Secondary | ICD-10-CM | POA: Diagnosis not present

## 2023-10-22 DIAGNOSIS — E039 Hypothyroidism, unspecified: Secondary | ICD-10-CM | POA: Diagnosis not present

## 2023-10-22 DIAGNOSIS — D509 Iron deficiency anemia, unspecified: Secondary | ICD-10-CM | POA: Diagnosis not present

## 2023-10-22 DIAGNOSIS — Z0000 Encounter for general adult medical examination without abnormal findings: Secondary | ICD-10-CM | POA: Diagnosis not present

## 2023-10-22 DIAGNOSIS — J449 Chronic obstructive pulmonary disease, unspecified: Secondary | ICD-10-CM | POA: Diagnosis not present

## 2023-10-22 DIAGNOSIS — J479 Bronchiectasis, uncomplicated: Secondary | ICD-10-CM | POA: Diagnosis not present

## 2023-10-22 DIAGNOSIS — Z23 Encounter for immunization: Secondary | ICD-10-CM | POA: Diagnosis not present

## 2023-10-22 DIAGNOSIS — E559 Vitamin D deficiency, unspecified: Secondary | ICD-10-CM | POA: Diagnosis not present

## 2023-10-22 DIAGNOSIS — K869 Disease of pancreas, unspecified: Secondary | ICD-10-CM | POA: Diagnosis not present

## 2023-10-22 NOTE — Telephone Encounter (Signed)
 Left message on machine to call back   Unable to reach pt by phone- all information has been mailed and sent to My Chart.  Ok received to hold plavix  prior to procedure. Faxed response to be scanned into EPIC.

## 2023-11-27 ENCOUNTER — Telehealth: Payer: Self-pay | Admitting: Gastroenterology

## 2023-11-27 NOTE — Telephone Encounter (Addendum)
 Procedure:Upper EUS Procedure date: 12/05/23 Procedure location: [facility] Arrival Time: 12:54 pm Spoke with the patient Y/N:  No, Called [phone] on 11/27/23 @ 11:00 am but couldn't leave a message because the mailbox is full, Mychart message sent. No, Called [phone] on 11/28/23 @ 10:37 am but couldn't leave a message because the mailbox is full, called [phone] and talked with pt's son, [name], with a message for pt to return my call. No, Called [phone] on 11/29/23 @ 9:30 am but couldn't leave a message because the mailbox is full  Any prep concerns? ___  Has the patient obtained the prep from the pharmacy ? ___ Do you have a care partner and transportation: ___ Any additional concerns? ___

## 2023-11-28 ENCOUNTER — Encounter (HOSPITAL_COMMUNITY): Payer: Self-pay | Admitting: Gastroenterology

## 2023-11-28 NOTE — Progress Notes (Signed)
 Pre op call [name]       PCPGLENWOOD Duck MD  Cardiologist-Acharya MD PulmonologistGLENWOOD Salt MD  EKG-03/26/23 Echo-08/07/22 Cath-10/24/21 Stress-n/a ICD/PM-n/a GLP1-n/a Blood Thinner-Plavix  7 day hold last dose 10/9  History:LBBB, HTN, COPD, BRCA. Patient last saw pulm MD 10/10/23. Also saw cardiologist back in 07/2022 was supposed to f/u in 3 months. Per patient doesn't have a f/u with them but denies any heart or breathing issues, no chest pains/ SOB, does use cane for a bad knee.  Anesthesia Review- Yes- okay to proceed

## 2023-11-29 NOTE — Telephone Encounter (Signed)
 Noted all information has been mailed to the pt and sent to My Chart

## 2023-12-05 ENCOUNTER — Encounter (HOSPITAL_COMMUNITY): Payer: Self-pay | Admitting: Anesthesiology

## 2023-12-05 ENCOUNTER — Ambulatory Visit (HOSPITAL_COMMUNITY): Admission: RE | Admit: 2023-12-05 | Source: Home / Self Care | Admitting: Gastroenterology

## 2023-12-05 ENCOUNTER — Telehealth (HOSPITAL_COMMUNITY): Payer: Self-pay

## 2023-12-05 SURGERY — ULTRASOUND, UPPER GI TRACT, ENDOSCOPIC
Anesthesia: Monitor Anesthesia Care

## 2023-12-05 NOTE — Telephone Encounter (Signed)
 Attempted to reach pt by phone- no answer I will mail letter and have pt call to set up follow up appt

## 2023-12-05 NOTE — Telephone Encounter (Signed)
 [name] was scheduled for Upper Endoscopic Ultrasound with Dr. Wilhelmenia on 12/05/23, at [facility].   Patient did not show for procedure. MD made aware and office staff notified. Called patient with no answer.

## 2023-12-05 NOTE — Telephone Encounter (Signed)
 Thanks for the update, and sorry to hear that.  HD

## 2023-12-05 NOTE — Telephone Encounter (Signed)
 Thank you Almarie for update.  FYI HD and JLL, patient no-showed for her EUS today. We can have Patty or Beth work on trying to reach out to patient and reschedule, but it is going to be in December or January at this point. GM

## 2023-12-06 DIAGNOSIS — I129 Hypertensive chronic kidney disease with stage 1 through stage 4 chronic kidney disease, or unspecified chronic kidney disease: Secondary | ICD-10-CM | POA: Diagnosis not present

## 2023-12-06 DIAGNOSIS — Z853 Personal history of malignant neoplasm of breast: Secondary | ICD-10-CM | POA: Diagnosis not present

## 2023-12-06 DIAGNOSIS — K869 Disease of pancreas, unspecified: Secondary | ICD-10-CM | POA: Diagnosis not present

## 2023-12-06 DIAGNOSIS — Z9989 Dependence on other enabling machines and devices: Secondary | ICD-10-CM | POA: Diagnosis not present

## 2023-12-06 DIAGNOSIS — E785 Hyperlipidemia, unspecified: Secondary | ICD-10-CM | POA: Diagnosis not present

## 2023-12-06 DIAGNOSIS — N1831 Chronic kidney disease, stage 3a: Secondary | ICD-10-CM | POA: Diagnosis not present

## 2023-12-06 DIAGNOSIS — F0393 Unspecified dementia, unspecified severity, with mood disturbance: Secondary | ICD-10-CM | POA: Diagnosis not present

## 2023-12-06 DIAGNOSIS — M1990 Unspecified osteoarthritis, unspecified site: Secondary | ICD-10-CM | POA: Diagnosis not present

## 2023-12-06 DIAGNOSIS — M4800 Spinal stenosis, site unspecified: Secondary | ICD-10-CM | POA: Diagnosis not present

## 2023-12-06 DIAGNOSIS — Z8249 Family history of ischemic heart disease and other diseases of the circulatory system: Secondary | ICD-10-CM | POA: Diagnosis not present

## 2023-12-06 DIAGNOSIS — I2510 Atherosclerotic heart disease of native coronary artery without angina pectoris: Secondary | ICD-10-CM | POA: Diagnosis not present

## 2023-12-06 DIAGNOSIS — F329 Major depressive disorder, single episode, unspecified: Secondary | ICD-10-CM | POA: Diagnosis not present

## 2024-01-15 ENCOUNTER — Telehealth: Payer: Self-pay

## 2024-01-15 NOTE — Telephone Encounter (Signed)
 Copied from CRM #[id number]. Topic: Appointments - Scheduling Inquiry for Clinic >> Jan 14, 2024  3:41 PM Leila C wrote: Reason for CRM: Patient's son Bernardino [phone] wants to schedule a follow up. Patient saw Dr. Neysa 10/10/23 and was advised to follow up in 6 months. Informed Bernardino, CT chest scan order for 03/2024. Provided [facility] phone number [phone]. Bernardino is asking whom patient needs to see now Dr. Neysa is retiring? Please advise and call back.   ATC the pt x1. LMTCB Called and spoke with the pts son (DPR) and advised pt does not need to be seen now unless she is having pulm issues. Pts son states she is fine just trying to schedule with new provider.  Pt has been scheduled to see RB in early march to review CT scan that needs to be done in Feb.  Nothing further needed.

## 2024-03-23 ENCOUNTER — Ambulatory Visit (HOSPITAL_BASED_OUTPATIENT_CLINIC_OR_DEPARTMENT_OTHER)

## 2024-04-23 ENCOUNTER — Ambulatory Visit: Admitting: Emergency Medicine
